# Patient Record
Sex: Male | Born: 1940
Health system: Southern US, Community
[De-identification: ages and names within clinical notes are randomized; demographics above are authoritative.]

## PROBLEM LIST (undated history)

## (undated) DIAGNOSIS — I7300 Raynaud's syndrome without gangrene: Secondary | ICD-10-CM

## (undated) DIAGNOSIS — G4733 Obstructive sleep apnea (adult) (pediatric): Secondary | ICD-10-CM

## (undated) DIAGNOSIS — C679 Malignant neoplasm of bladder, unspecified: Secondary | ICD-10-CM

## (undated) DIAGNOSIS — R0600 Dyspnea, unspecified: Secondary | ICD-10-CM

## (undated) DIAGNOSIS — G35 Multiple sclerosis: Secondary | ICD-10-CM

## (undated) DIAGNOSIS — Z9289 Personal history of other medical treatment: Secondary | ICD-10-CM

## (undated) DIAGNOSIS — I712 Thoracic aortic aneurysm, without rupture, unspecified: Secondary | ICD-10-CM

## (undated) DIAGNOSIS — R339 Retention of urine, unspecified: Secondary | ICD-10-CM

## (undated) DIAGNOSIS — F0390 Unspecified dementia without behavioral disturbance: Secondary | ICD-10-CM

## (undated) DIAGNOSIS — I2510 Atherosclerotic heart disease of native coronary artery without angina pectoris: Secondary | ICD-10-CM

## (undated) DIAGNOSIS — M1990 Unspecified osteoarthritis, unspecified site: Secondary | ICD-10-CM

## (undated) DIAGNOSIS — F03A0 Unspecified dementia, mild, without behavioral disturbance, psychotic disturbance, mood disturbance, and anxiety: Secondary | ICD-10-CM

## (undated) DIAGNOSIS — I498 Other specified cardiac arrhythmias: Secondary | ICD-10-CM

## (undated) DIAGNOSIS — N189 Chronic kidney disease, unspecified: Secondary | ICD-10-CM

## (undated) DIAGNOSIS — I110 Hypertensive heart disease with heart failure: Secondary | ICD-10-CM

## (undated) DIAGNOSIS — I517 Cardiomegaly: Secondary | ICD-10-CM

## (undated) DIAGNOSIS — I5032 Chronic diastolic (congestive) heart failure: Secondary | ICD-10-CM

## (undated) DIAGNOSIS — R911 Solitary pulmonary nodule: Secondary | ICD-10-CM

## (undated) DIAGNOSIS — K219 Gastro-esophageal reflux disease without esophagitis: Secondary | ICD-10-CM

## (undated) DIAGNOSIS — F329 Major depressive disorder, single episode, unspecified: Secondary | ICD-10-CM

## (undated) DIAGNOSIS — F419 Anxiety disorder, unspecified: Secondary | ICD-10-CM

## (undated) DIAGNOSIS — I639 Cerebral infarction, unspecified: Secondary | ICD-10-CM

## (undated) DIAGNOSIS — F319 Bipolar disorder, unspecified: Secondary | ICD-10-CM

## (undated) DIAGNOSIS — J449 Chronic obstructive pulmonary disease, unspecified: Secondary | ICD-10-CM

## (undated) DIAGNOSIS — F32A Depression, unspecified: Secondary | ICD-10-CM

## (undated) DIAGNOSIS — I4510 Unspecified right bundle-branch block: Secondary | ICD-10-CM

## (undated) DIAGNOSIS — R0609 Other forms of dyspnea: Secondary | ICD-10-CM

## (undated) DIAGNOSIS — E785 Hyperlipidemia, unspecified: Secondary | ICD-10-CM

## (undated) DIAGNOSIS — R7989 Other specified abnormal findings of blood chemistry: Secondary | ICD-10-CM

## (undated) DIAGNOSIS — N400 Enlarged prostate without lower urinary tract symptoms: Secondary | ICD-10-CM

## (undated) DIAGNOSIS — I340 Nonrheumatic mitral (valve) insufficiency: Secondary | ICD-10-CM

## (undated) DIAGNOSIS — R9431 Abnormal electrocardiogram [ECG] [EKG]: Secondary | ICD-10-CM

## (undated) HISTORY — DX: Thoracic aortic aneurysm, without rupture: I71.2

## (undated) HISTORY — PX: APPENDECTOMY: SHX54

## (undated) HISTORY — DX: Other forms of dyspnea: R06.09

## (undated) HISTORY — DX: Benign prostatic hyperplasia without lower urinary tract symptoms: N40.0

## (undated) HISTORY — DX: Personal history of other medical treatment: Z92.89

## (undated) HISTORY — DX: Retention of urine, unspecified: R33.9

## (undated) HISTORY — DX: Other specified abnormal findings of blood chemistry: R79.89

## (undated) HISTORY — DX: Obstructive sleep apnea (adult) (pediatric): G47.33

## (undated) HISTORY — DX: Chronic diastolic (congestive) heart failure: I50.32

## (undated) HISTORY — DX: Nonrheumatic mitral (valve) insufficiency: I34.0

## (undated) HISTORY — DX: Hyperlipidemia, unspecified: E78.5

## (undated) HISTORY — PX: TOTAL KNEE ARTHROPLASTY: SHX125

## (undated) HISTORY — DX: Dyspnea, unspecified: R06.00

## (undated) HISTORY — DX: Solitary pulmonary nodule: R91.1

## (undated) HISTORY — DX: Multiple sclerosis: G35

## (undated) HISTORY — DX: Depression, unspecified: F32.A

## (undated) HISTORY — DX: Abnormal electrocardiogram (ECG) (EKG): R94.31

## (undated) HISTORY — PX: HEMORRHOID SURGERY: SHX153

## (undated) HISTORY — PX: ROTATOR CUFF REPAIR: SHX139

## (undated) HISTORY — DX: Malignant neoplasm of bladder, unspecified: C67.9

## (undated) HISTORY — DX: Atherosclerotic heart disease of native coronary artery without angina pectoris: I25.10

## (undated) HISTORY — DX: Unspecified dementia, mild, without behavioral disturbance, psychotic disturbance, mood disturbance, and anxiety: F03.A0

## (undated) HISTORY — DX: Cardiomegaly: I51.7

## (undated) HISTORY — PX: KNEE ARTHROPLASTY: SHX992

## (undated) HISTORY — DX: Major depressive disorder, single episode, unspecified: F32.9

## (undated) HISTORY — PX: TRANSURETHRAL RESECTION OF PROSTATE: SHX73

## (undated) HISTORY — DX: Thoracic aortic aneurysm, without rupture, unspecified: I71.20

## (undated) HISTORY — DX: Unspecified dementia without behavioral disturbance: F03.90

## (undated) HISTORY — PX: TONSILLECTOMY: SUR1361

## (undated) HISTORY — DX: Chronic kidney disease, unspecified: N18.9

## (undated) HISTORY — DX: Hypertensive heart disease with heart failure: I11.0

## (undated) HISTORY — DX: Unspecified osteoarthritis, unspecified site: M19.90

## (undated) HISTORY — PX: COLONOSCOPY: SHX174

---

## 2004-12-14 ENCOUNTER — Ambulatory Visit: Payer: Self-pay | Admitting: Internal Medicine

## 2004-12-21 ENCOUNTER — Ambulatory Visit: Payer: Self-pay | Admitting: Internal Medicine

## 2006-10-11 DIAGNOSIS — G35 Multiple sclerosis: Secondary | ICD-10-CM | POA: Insufficient documentation

## 2007-12-20 ENCOUNTER — Inpatient Hospital Stay (HOSPITAL_COMMUNITY): Admission: RE | Admit: 2007-12-20 | Discharge: 2007-12-22 | Payer: Self-pay | Admitting: Orthopedic Surgery

## 2009-01-24 ENCOUNTER — Encounter: Payer: Self-pay | Admitting: Pulmonary Disease

## 2009-01-24 ENCOUNTER — Encounter: Admission: RE | Admit: 2009-01-24 | Discharge: 2009-01-24 | Payer: Self-pay | Admitting: Family Medicine

## 2009-03-06 ENCOUNTER — Ambulatory Visit: Payer: Self-pay | Admitting: Cardiology

## 2009-03-06 DIAGNOSIS — R0989 Other specified symptoms and signs involving the circulatory and respiratory systems: Secondary | ICD-10-CM | POA: Insufficient documentation

## 2009-03-06 DIAGNOSIS — R0789 Other chest pain: Secondary | ICD-10-CM | POA: Insufficient documentation

## 2009-03-06 DIAGNOSIS — R072 Precordial pain: Secondary | ICD-10-CM | POA: Insufficient documentation

## 2009-03-06 DIAGNOSIS — R0602 Shortness of breath: Secondary | ICD-10-CM | POA: Insufficient documentation

## 2009-03-13 ENCOUNTER — Telehealth: Payer: Self-pay | Admitting: Cardiology

## 2009-03-13 ENCOUNTER — Encounter: Payer: Self-pay | Admitting: Cardiology

## 2009-04-01 ENCOUNTER — Telehealth (INDEPENDENT_AMBULATORY_CARE_PROVIDER_SITE_OTHER): Payer: Self-pay | Admitting: *Deleted

## 2009-04-02 ENCOUNTER — Ambulatory Visit: Payer: Self-pay

## 2009-04-02 ENCOUNTER — Encounter (HOSPITAL_COMMUNITY): Admission: RE | Admit: 2009-04-02 | Discharge: 2009-06-04 | Payer: Self-pay | Admitting: Cardiology

## 2009-04-02 ENCOUNTER — Ambulatory Visit: Payer: Self-pay | Admitting: Internal Medicine

## 2009-04-02 ENCOUNTER — Encounter: Payer: Self-pay | Admitting: Cardiovascular Disease

## 2009-04-03 ENCOUNTER — Ambulatory Visit: Payer: Self-pay | Admitting: Internal Medicine

## 2009-04-07 ENCOUNTER — Encounter: Payer: Self-pay | Admitting: Cardiology

## 2009-04-08 ENCOUNTER — Ambulatory Visit (HOSPITAL_COMMUNITY): Admission: RE | Admit: 2009-04-08 | Discharge: 2009-04-08 | Payer: Self-pay | Admitting: Cardiology

## 2009-04-08 ENCOUNTER — Ambulatory Visit: Payer: Self-pay | Admitting: Cardiology

## 2009-04-08 ENCOUNTER — Ambulatory Visit: Payer: Self-pay

## 2009-04-08 ENCOUNTER — Encounter: Payer: Self-pay | Admitting: Cardiology

## 2009-04-08 DIAGNOSIS — I110 Hypertensive heart disease with heart failure: Secondary | ICD-10-CM

## 2009-04-08 DIAGNOSIS — I059 Rheumatic mitral valve disease, unspecified: Secondary | ICD-10-CM | POA: Insufficient documentation

## 2009-04-08 HISTORY — DX: Hypertensive heart disease with heart failure: I11.0

## 2009-04-09 ENCOUNTER — Telehealth: Payer: Self-pay | Admitting: Cardiology

## 2009-04-23 ENCOUNTER — Telehealth: Payer: Self-pay | Admitting: Cardiology

## 2009-04-24 ENCOUNTER — Telehealth: Payer: Self-pay | Admitting: Cardiology

## 2009-05-26 ENCOUNTER — Ambulatory Visit: Payer: Self-pay | Admitting: Pulmonary Disease

## 2009-05-26 DIAGNOSIS — J438 Other emphysema: Secondary | ICD-10-CM | POA: Insufficient documentation

## 2009-06-11 ENCOUNTER — Encounter: Admission: RE | Admit: 2009-06-11 | Discharge: 2009-06-11 | Payer: Self-pay | Admitting: Orthopedic Surgery

## 2009-08-12 ENCOUNTER — Encounter: Admission: RE | Admit: 2009-08-12 | Discharge: 2009-08-12 | Payer: Self-pay | Admitting: Family Medicine

## 2009-12-10 ENCOUNTER — Encounter: Payer: Self-pay | Admitting: Cardiology

## 2009-12-17 ENCOUNTER — Encounter: Payer: Self-pay | Admitting: Cardiology

## 2009-12-17 ENCOUNTER — Ambulatory Visit: Payer: Self-pay | Admitting: Cardiology

## 2009-12-17 DIAGNOSIS — R9431 Abnormal electrocardiogram [ECG] [EKG]: Secondary | ICD-10-CM | POA: Insufficient documentation

## 2010-01-01 ENCOUNTER — Ambulatory Visit (HOSPITAL_COMMUNITY)
Admission: RE | Admit: 2010-01-01 | Discharge: 2010-01-01 | Payer: Self-pay | Source: Home / Self Care | Attending: Gastroenterology | Admitting: Gastroenterology

## 2010-02-03 NOTE — Assessment & Plan Note (Signed)
Summary: Cardiology Nuclear Study  Nuclear Med Background Indications for Stress Test: Evaluation for Ischemia   History: Abnormal EKG, COPD, Echo, Myocardial Perfusion Study  History Comments: 7/09 Echo: Grade I diastolic dysfunction, mod. LVH, mild MR; 8/09 GEX:BMWUXL, EF=57%  Symptoms: Dizziness, DOE, Fatigue, Near Syncope, Palpitations    Nuclear Pre-Procedure Cardiac Risk Factors: History of Smoking, Hypertension, Lipids, RBBB Caffeine/Decaff Intake: none NPO After: 8:00 PM Lungs: Clear IV 0.9% NS with Angio Cath: 18g     IV Site: (R) AC IV Started by: Stanton Kidney EMT-P Chest Size (in) 44     Height (in): 71 Weight (lb): 199 BMI: 27.86 Tech Comments: (R) Carotid Bruit. Metoprolol last taken approx. 16 hours ago, per Patient.  Nuclear Med Study 1 or 2 day study:  1 day     Stress Test Type:  Stress Reading MD:  Arvilla Meres, MD     Referring MD:  Marca Ancona, MD Resting Radionuclide:  Technetium 46m Tetrofosmin     Resting Radionuclide Dose:  11 mCi  Stress Radionuclide:  Technetium 18m Tetrofosmin     Stress Radionuclide Dose:  33 mCi   Stress Protocol Exercise Time (min):  5:00 min     Max HR:  150 bpm     Predicted Max HR:  153 bpm  Max Systolic BP: 239 mm Hg     Percent Max HR:  98.04 %     METS: 7.0 Rate Pressure Product:  24401    Stress Test Technologist:  Rea College CMA-N     Nuclear Technologist:  Domenic Polite CNMT  Rest Procedure  Myocardial perfusion imaging was performed at rest 45 minutes following the intravenous administration of Myoview Technetium 8m Tetrofosmin.  Stress Procedure  The patient exercised for  five minutes.  The patient stopped due to fatigue and SOB with his O2 Sats dropping to 90% at peak exercise, at which time he had minimal expiratory wheezes.  He denied any chest pain.  There were no significant ST-T wave changes, only occasional PVC's.  He did have a hypertensive response to exercise, 207/111 at peak exercise and  220/102 and 239/87 in recovery.  Myoview was injected at peak exercise and myocardial perfusion imaging was performed after a brief delay.  QPS Raw Data Images:  Normal; no motion artifact; normal heart/lung ratio. Stress Images:  Decreased uptake in inferior wall. Rest Images:  Decreased uptake in inferior wall. Subtraction (SDS):  Fixed thinning of the inferior wall suggestive of previous infarct vs diaphragmatic attenuation. No ischemia. Transient Ischemic Dilatation:  1.02  (Normal <1.22)  Lung/Heart Ratio:  .31  (Normal <0.45)  Quantitative Gated Spect Images QGS EDV:  117 ml QGS ESV:  38 ml QGS EF:  68 % QGS cine images:  Normal  Findings Low risk nuclear study      Overall Impression  Exercise Capacity: Fair exercise capacity. BP Response: Hypertensive blood pressure response. Clinical Symptoms: There is dyspnea. ECG Impression: No significant ST segment change suggestive of ischemia. Overall Impression: Low risk stress nuclear study. Overall Impression Comments: Fixed thinning of the inferior wall suggestive of previous infarct vs diaphragmatic attenuation. No ischemia. O2 sats dropped to 90% at peak exercise.  Appended Document: Cardiology Nuclear Study inferior defect is likely diaphragmatic attenuation, also seen 2009  Appended Document: Cardiology Nuclear Study discussed at [facility] 04-08-09 with Dr Shirlee Latch

## 2010-02-03 NOTE — Miscellaneous (Signed)
Summary: Orders Update pft charges  Clinical Lists Changes  Orders: Added new Service order of Carbon Monoxide diffusing w/capacity (94720) - Signed Added new Service order of Lung Volumes (94240) - Signed Added new Service order of Spirometry (Pre & Post) (94060) - Signed 

## 2010-02-03 NOTE — Progress Notes (Signed)
Summary: Nuclear Pre-Procedure  Phone Note Outgoing Call Call back at [facility] Phone [phone]   Call placed by: Stanton Kidney, EMT-P,  April 01, 2009 3:19 PM Action Taken: Phone Call Completed Summary of Call: Reviewed information on Myoview Information Sheet (see scanned document for further details).  Spoke with Patient's wife.    Nuclear Med Background Indications for Stress Test: Evaluation for Ischemia   History: Abnormal EKG, COPD, Echo, Myocardial Perfusion Study  History Comments: 7/09 Echo: Grade I diatolic dysfunction, mod. LVH, mild MR 8/09 MPS: EF=57%, NL  Symptoms: Chest Pressure, Chest Pressure with Exertion, DOE, Fatigue    Nuclear Pre-Procedure Cardiac Risk Factors: History of Smoking, Hypertension, Lipids, RBBB Height (in): 72

## 2010-02-03 NOTE — Assessment & Plan Note (Signed)
Summary: [name] [name]   Primary Provider:  Dr. Tanya Nones   History of Present Illness: 70 yo with history of HTN, hyperlipidemia, and GERD presents for evaluation of exertional shortness of breath and episodes of chest pain.  For the last year, patient has had gradually worsening exertional shortness of breath.  Now, he is short of breath with moderate exertion. It is hard to climb steps.  On occasion, the exertional dyspnea is associated with mild chest tightness.   His symptoms occur daily.  No orthopnea, PND, syncope, lightheadedness.  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  He has noted significant shortness of breath when he has to use a weedeater.  He used to be a heavy smoker.  His wife says she notice him wheezing occasionally.  He has been taking Advair but is not sure that it is helping.  Additionally, he thinks he has had about a 30 lb weight gain in the last 3 months.    Patient has risk factors for both pulmonary and cardiac causes of dyspnea.  ETT-myoview showed desaturation to 90% on room air at peak exercise.  There was a hypertensive BP response with SBP > 200.  There was a fixed inferior defect likely due to diaphragmatic attenuation similar to 2009 study, no evidence for ischemia.  Echo showed normal LV systolic function, mild diastolic dysfunction, and moderate mitral regurgitation with mild pulmonary hypertension.  Finally, PFTs showed a moderate obstructive deficit suggestive moderate COPD.    Labs (6/10): LDL 81, HDL 66, K 4.5, creatinine 2.13 Labs (4/11): BNP 75  Current Medications (verified): 1)  Metoprolol Tartrate 25 Mg Tabs (Metoprolol Tartrate) .... Take One Tablet Two Times A Day 2)  Prilosec 20 Mg Cpdr (Omeprazole) .... Take One Capsule Once Daily 3)  Celebrex 200 Mg Caps (Celecoxib) .... Take One Tablet Once Daily 4)  Flomax 0.4 Mg Caps (Tamsulosin Hcl) .... Take One Capsule Once Daily 5)   Finasteride 5 Mg Tabs (Finasteride) .... Take One Tablet Once Daily 6)  Diovan 160 Mg Tabs (Valsartan) .... Take One Tablet Once Daily 7)  Effexor Xr 150 Mg Xr24h-Cap (Venlafaxine Hcl) .... Take One Capsule Two Times A Day 8)  Advair Diskus 250-50 Mcg/dose Aepb (Fluticasone-Salmeterol) .... Each Day 9)  Restoril 30 Mg Caps (Temazepam) .... Take One Capsule Every Four To Six Hours 10)  Methocarbamol 500 Mg Tabs (Methocarbamol) .... Take One Tablet Every 6 Hours 11)  Lortab 7.5 7.5-500 Mg Tabs (Hydrocodone-Acetaminophen) .... Take One Tablet Every 4 To 6 Hours For Pain 12)  Aspirin 81 Mg Tbec (Aspirin) .... Take One Tablet By Mouth Daily 13)  Hydrocodone-Acetaminophen 10-325 Mg Tabs (Hydrocodone-Acetaminophen) .... As Needed  Allergies (verified): 1)  ! Codeine Sulfate (Codeine Sulfate)  Past History:  Past Medical History: 1. Hypertension 2. Hypercholesterolemia 3. GERD 4. BPH 5. Osteoarthritis: s/p R TKR 6. Multiple sclerosis: Quiescent over last few years. Diagnosed at age 82.  7. Rotator cuff surgery 2/11 8. COPD: Prior heavy smoker.  PFTs (3/11): FVC 87%,  FEV173%, ratio 0.57, DLCO 75%, TLC 121%.  Moderate obstructive defect.  9. Exertional dyspnea: Lexiscan myoview (8/09) with EF 57%, diaphragmatic attenuation but no ischemia or infarction.  Lexiscan myoview (3/11): EF 68% with fixed inferior defect likely due to diaphragmatic attenuation and no ischemia (similar to 2009).  Echo (4/11): EF 55-60%, mild diastolic dysfunction, moderate mitral regurgitation, mild pulmonary HTN (PASP 38 mmHg), normal RV size and systolic  function.  PFTs (3/11) with moderate obstructive defect.  10.  Moderate mitral regurgitation.  Echo (4/11) with PISA ERO 0.3 cm^2 and regurgitant volume 41 mL.  11.  Carotid dopplers (3/11): no significant stenosis.   Family History: Reviewed history from 03/06/2009 and no changes required. Family History Hypertension Father history of stroke.  Mother history of  stroke.  Sister with hypertension.  Brother with kidney cancer, arthritis and  bilateral knee replacements.  No premature CAD  Social History: Reviewed history from 03/06/2009 and no changes required. Married, lives in [place], does landscaping Tobacco Use - Former, quit 15 years ago, heavy prior (3 ppd).  Alcohol Use - no  Review of Systems       All systems reviewed and negative except as per HPI.   Vital Signs:  Patient profile:   70 year old male Height:      71 inches Weight:      197 pounds BMI:     27.58 Pulse rate:   68 / minute Resp:     16 per minute BP sitting:   142 / 78  (left arm)  Vitals Entered By: Marrion Coy, CNA (April 08, 2009 9:19 AM)  Physical Exam  General:  Well developed, well nourished, in no acute distress. Neck:  Neck supple, no JVD. No masses, thyromegaly or abnormal cervical nodes. Lungs:  Clear except for a prolonged expiratory phase.  Heart:  Non-displaced PMI, chest non-tender; regular rate and rhythm, S1, S2 without  rubs or gallops. 1/6 systolic murmur at apex.  Carotid upstroke normal, right carotid bruit. Pedals normal pulses. No edema, no varicosities. Abdomen:  Bowel sounds positive; abdomen soft and non-tender without masses, organomegaly, or hernias noted. No hepatosplenomegaly. Extremities:  No clubbing or cyanosis. Neurologic:  Alert and oriented x 3. Psych:  Normal affect.   Impression & Recommendations:  Problem # 1:  SHORTNESS OF BREATH (ICD-786.05) This may be mostly due to COPD.  Patient had a moderate obstructive defect on PFTs and was a prior heavy smoker. His myoview was non-ischemic (probably normal with diaphragmatic attenuation) and his echo showed preserved LV systolic function was moderate mitral regurgitation (should not be significant enough to cause dyspnea).  BNP was not significantly elevated.  Patient is taking Advair.  I will see if the addition of Spiriva does anything to alleviate his symptoms.     Problem # 2:  CHEST PAIN-PRECORDIAL (ICD-786.51) Likely noncardiac.  Myoview was nonischemic.  Continue aspirin.    Problem # 3:  CAROTID BRUIT, RIGHT (ICD-785.9) Likely due to tortuous artery.  No significant stenosis on carotid dopplers.   Problem # 4:  MITRAL VALVE DISORDERS (ICD-424.0) Moderate mitral regurgitation on echo.  This is significant but is unlikely to be causing any symptoms.  Will need to follow in the future.  He will need good BP control.   - Increasing valsartan today.  - Repeat echo in 1 year.   Problem # 5:  HYPERTENSION, UNSPECIFIED (ICD-401.9) BP elevated today and he was noted to have a hypertensive response on the treadmill.  Will increase valsartan to 240 mg daily.  BMET and BP check in 2 wks.    I will see the patient back in 6 months.   Other Orders: Echocardiogram (Echo) TLB-BNP (B-Natriuretic Peptide) (83880-[id number])  Patient Instructions: 1)  Your physician has recommended you make the following change in your medication:  2)  Use Spiriva Inhaler daily 3)  Increase Diovan(Valsartan) to a total of 240mg  daily--this will be  a 160mg  and an 80mg  tablet 4)  Your physician recommends that you return for lab work in: 2 weeks--BMP-you have the order --you can have this in [place] 786.09  424.0 5)  Your physician has requested that you regularly monitor and record your blood pressure readings at home.  Please use the same machine at the same time of day to check your readings and record. I will call you in 2 weeks to get the readings. 6)  Lab today--BNP  786.09 424.0  7)  Follow-up appointment with Dr Shirlee Latch in 6 months. 8)  Echocardiogram in 1 year.   Prescriptions: SPIRIVA HANDIHALER 18 MCG CAPS (TIOTROPIUM BROMIDE MONOHYDRATE) use daily as directed  #3 x 3   Entered by:   Katina Dung, RN, BSN   Authorized by:   Marca Ancona, MD   Signed by:   Katina Dung, RN, BSN on 04/08/2009   Method used:   Faxed to ...       [facility])        [address] [address]       [place], [place]  [address]       Ph: [phone]       Fax: [phone]   RxID:   [id number] SPIRIVA HANDIHALER 18 MCG CAPS (TIOTROPIUM BROMIDE MONOHYDRATE) use daily as directed  #3 x 3   Entered by:   Katina Dung, RN, BSN   Authorized by:   Marca Ancona, MD   Signed by:   Katina Dung, RN, BSN on 04/08/2009   Method used:   Print then Give to Patient   RxID:   [id number] DIOVAN 80 MG TABS (VALSARTAN) one tablet daily with Diovan 160mg   #30 x 6   Entered by:   Katina Dung, RN, BSN   Authorized by:   Marca Ancona, MD   Signed by:   Katina Dung, RN, BSN on 04/08/2009   Method used:   Electronically to        [id number] (retail)       [address]       [place], [place]  [address]       Ph: [phone]       Fax: [phone]   RxID:   [id number]

## 2010-02-03 NOTE — Assessment & Plan Note (Signed)
Summary: np6/DOE/weakness/.fatigue   Primary Provider:  Dr. Tanya Nones  CC:  new patient/DOE/weakness and fatigue.Marland Kitchen  History of Present Illness: 70 yo with history of HTN, hyperlipidemia, and GERD presents for evaluation of exertional shortness of breath and episodes of chest pain.  For the last year, patient has had gradually worsening exertional shortness of breath.  Now, he is short of breath simply walking around his house. It is very hard to climb steps.  On occasion, the exertional dyspnea is associated with mild chest tightness.   His symptoms occur daily.  No orthopnea, PND, syncope, lightheadedness.  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  His wife says she notice him wheezing occasionally.  He has been taking Advair but is not sure that it is helping.  Additionally, he thinks he has had about a 30 lb weight gain in the last 3 months.  Oxygen saturation today is 99% at rest.   ECG: NSR, inferior T wave inversions, incomplete RBBB  Labs (6/10): LDL 81, HDL 66, K 4.5, creatinine 1.61  Current Medications (verified): 1)  Bl Ibuprofen 200 Mg  Tabs (Ibuprofen) .... Takes 2400mg  Daily 2)  Metoprolol Tartrate 25 Mg Tabs (Metoprolol Tartrate) .... Take One Tablet Two Times A Day 3)  Prilosec 20 Mg Cpdr (Omeprazole) .... Take One Capsule Once Daily 4)  Celebrex 200 Mg Caps (Celecoxib) .... Take One Tablet Once Daily 5)  Flomax 0.4 Mg Caps (Tamsulosin Hcl) .... Take One Capsule Once Daily 6)  Finasteride 5 Mg Tabs (Finasteride) .... Take One Tablet Once Daily 7)  Diovan 160 Mg Tabs (Valsartan) .... Take One Tablet Once Daily 8)  Effexor Xr 150 Mg Xr24h-Cap (Venlafaxine Hcl) .... Take One Capsule Two Times A Day 9)  Advair Diskus 250-50 Mcg/dose Aepb (Fluticasone-Salmeterol) .... Each Day 10)  Hydromorphone Hcl 4 Mg Tabs (Hydromorphone Hcl) .... Take One Tablet Every 4 Hours 11)  Restoril 30 Mg Caps (Temazepam) ....  Take One Capsule Every Four To Six Hours 12)  Methocarbamol 500 Mg Tabs (Methocarbamol) .... Take One Tablet Every 6 Hours 13)  Lortab 7.5 7.5-500 Mg Tabs (Hydrocodone-Acetaminophen) .... Take One Tablet Every 4 To 6 Hours For Pain  Allergies (verified): 1)  ! Codeine Sulfate (Codeine Sulfate)  Past History:  Past Medical History: 1. Hypertension 2. Hypercholesterolemia 3. GERD 4. BPH 5. Osteoarthritis: s/p R TKR 6. Multiple sclerosis: Quiescent over last few years. Diagnosed at age 53.  7. Rotator cuff surgery 2/11 8. COPD: Prior smoker 9. Exertional dyspnea. Echo (7/09) with moderate LVH, grade I diastolic dysfunction, normal RV, mild MR. Lexiscan myoview (8/09) with EF 57%, diaphragmatic attenuation but no ischemia or infarction.   Family History: Family History Hypertension Father history of stroke.  Mother history of stroke.  Sister with hypertension.  Brother with kidney cancer, arthritis and  bilateral knee replacements.  No premature CAD  Social History: Married, lives in [place], does landscaping Tobacco Use - Former, quit 15 years ago, heavy prior (3 ppd).  Alcohol Use - no  Review of Systems       All systems reviewed and negative except as per HPI.   Vital Signs:  Patient profile:   70 year old male Height:      72 inches Weight:      210 pounds BMI:     28.58 Pulse rate:   78 / minute Pulse rhythm:   regular BP sitting:   117 /  71  (left arm) Cuff size:   large  Vitals Entered By: Judithe Modest CMA (March 06, 2009 3:09 PM)  Physical Exam  General:  Well developed, well nourished, in no acute distress. Head:  normocephalic and atraumatic Nose:  no deformity, discharge, inflammation, or lesions Mouth:  Teeth, gums and palate normal. Oral mucosa normal. Neck:  Neck supple, no JVD. No masses, thyromegaly or abnormal cervical nodes. Lungs:  Clear bilaterally to auscultation with prolonged expiratory phase.   Heart:  Non-displaced PMI, chest  non-tender; regular rate and rhythm, S1, S2 without  rubs or gallops. 1/6 SEM.  Carotid upstroke normal, right carotid bruit. Pedals normal pulses. No edema, no varicosities. Abdomen:  Bowel sounds positive; abdomen soft and non-tender without masses, organomegaly, or hernias noted. No hepatosplenomegaly. Msk:  Back normal, normal gait. Muscle strength and tone normal. Extremities:  No clubbing or cyanosis. Neurologic:  Alert and oriented x 3. Skin:  Intact without lesions or rashes. Psych:  Normal affect.   Impression & Recommendations:  Problem # 1:  SHORTNESS OF BREATH (ICD-786.05) Patient has quite significant exertional dyspnea that has worsened over the past year but has progressed fairly fast in the last few months.  DIfferential includes COPD, CHF, CAD with ischemic diastolic dysfunction.  He used to be a heavy smoker (quit 15 years ago).  He does have the diagnosis of COPD.  He does not appear volume overloaded on exam.  I will have him get an echocardiogram to assess LV and RV function.  I will also draw a BNP.  Given his quite significant symptoms, I think that we should do a full set of PFTs (spirometry, lung volumes, pre-and post-bronchoilator, DLCO) to assess severity of COPD.   Problem # 2:  CHEST PAIN-PRECORDIAL (ICD-786.51) Patient gets mild episodes of chest tightness with the same exertion that brings on the shortness of breath.  Myoview in 2009 prior to these symptoms was normal.  I will have him start ASA 81 mg daily.  I will get an ETT-myoview.  While he is on the treadmill, we will check his oxygen level.    Problem # 3:  CAROTID BRUIT, RIGHT (ICD-785.9) We will get carotid dopplers.   Other Orders: Nuclear Stress Test (Nuc Stress Test) Echocardiogram (Echo) Pulmonary Function Test (PFT) Carotid Duplex (Carotid Duplex)  Patient Instructions: 1)  Your physician has recommended you make the following change in your medication:  2)  Start Aspirin 81mg  daily--this  should be buffered or coated. 3)  Your physician has requested that you have an echocardiogram.  Echocardiography is a painless test that uses sound waves to create images of your heart. It provides your doctor with information about the size and shape of your heart and how well your heart's chambers and valves are working.  This procedure takes approximately one hour. There are no restrictions for this procedure. 4)  Your physician has requested that you have an exercise stress myoview.  For further information please visit [email or web address]  Please follow instruction sheet, as given. 5)  Your physician has recommended that you have a pulmonary function test.  Pulmonary Function Tests are a group of tests that measure how well air moves in and out of your lungs. 6)  Your physician recommends that you schedule a follow-up appointment in: 1 month with Dr Shirlee Latch Tuesday April 5,2011 9:15am --THIS WILL BE AT [address] 7)  Your physician has requested that you have a carotid duplex. This test is an  ultrasound of the carotid arteries in your neck. It looks at blood flow through these arteries that supply the brain with blood. Allow one hour for this exam. There are no restrictions or special instructions.

## 2010-02-03 NOTE — Progress Notes (Signed)
Summary: Dr.Hecker office calling with questions about the pt medication.  Phone Note Other Incoming   Caller: Dr.Hecker office Neysa Bonito [phone] Summary of Call: Office needs a call back about the pt  medication and a procdure (Aspirin) Initial call taken by: Judie Grieve,  March 13, 2009 1:26 PM  Follow-up for Phone Call        S/W Sherri and the pt needs a shave lid biopsy for a lesion which is suspicious for CA. He will need to stop his Celebrex and ASA for 2 weeks. I will s/w Dr. Shirlee Latch and see if this is ok. Fax number is [phone] ATTN: Sherri or Christy Follow-up by: Duncan Dull, RN, BSN,  March 13, 2009 1:57 PM  Additional Follow-up for Phone Call Additional follow up Details #1::        Per Dr. Shirlee Latch it is ok IF the myoview on 3/23 comes back NORMAL. If not he will need to be made aware for further instructions. Sherri will plan to call us back for clearance after the 23rd. I ask her why they needed to stop ASA for 2 weeks for an eyelid shaving and she said that the spot on his lid appears very vascular and they are expecting more bleeding with his biopsy.  Additional Follow-up by: Duncan Dull, RN, BSN,  March 13, 2009 2:15 PM

## 2010-02-03 NOTE — Progress Notes (Signed)
Summary: B/P readings--04-23-09  Phone Note Outgoing Call   Call placed by: Katina Dung, RN, BSN,  April 23, 2009 2:45 PM Call placed to: Patient  Follow-up for Phone Call        get B/P readings-LM for pt to call me to get B/P readings [name] [name]  LM for pt to call to get B/P readings and ask if he had BMP at [facility] office  Katina Dung, RN, BSN  April 23, 2009 5:59 PM   Additional Follow-up for Phone Call Additional follow up Details #1::

## 2010-02-03 NOTE — Progress Notes (Signed)
Summary: clearance  Phone Note From Other Clinic   Caller: nurse sherry Summary of Call: Per Cordelia Pen from Dr Elmer Picker. Pt needs clearance for Biopsy on his eye. [phone] fax [phone] Initial call taken by: Edman Circle,  April 09, 2009 1:05 PM  Follow-up for Phone Call        [facility] for [name], RN, BSN  April 09, 2009 2:09 PM  talked with Cordelia Pen at Dr Deirdre Evener office--requesting pt hold aspirin for 14 days prior to biopsy on eyelid--reviewed with Dr Wilmon Pali to hold Aspirin for 14 days for biopsy--Sherry aware -OK to hold aspirin for 14 days

## 2010-02-03 NOTE — Assessment & Plan Note (Signed)
Summary: consult for dyspnea, moderate emphysema   Copy to:  Lynnea Ferrier Primary Provider/Referring Provider:  Lynnea Ferrier  CC:  Pulmonary Consult.  History of Present Illness: the patient is a 70 year old male who been asked to see for dyspnea. He has a history of breathing issues for at least one year, and feels that his symptoms are progressive in nature. He notes a 2-3 block dyspnea on exertion at a moderate pace on flat ground. However, he will not get winded bringing groceries in from the car or taking trash cans down to the street. He does have a long history of smoking 2 packs per day, but quit 15 years ago. He has had recent full pulmonary function studies that showed moderate obstructive disease, no restriction but definite air-trapping, and a mild reduction in his diffusion capacity. He had a chest x-ray in January that showed no acute process. He has had an echocardiogram in April of this year which showed mild diastolic dysfunction with a mildly dilated left atrium, moderate MR, and a normal left ventricle. The patient has been started on Advair in January, and Spiriva was added approximately one month ago. The patient has not seen any difference in his breathing with either medication. He does note a mild cough has been present since starting Advair. The patient denies any history of thromboembolic disease, and has had no pleuritic chest pain. His weight is up about 30 pounds the past one year.  Medications Prior to Update: 1)  Metoprolol Tartrate 25 Mg Tabs (Metoprolol Tartrate) .... Take One Tablet Two Times A Day 2)  Prilosec 20 Mg Cpdr (Omeprazole) .... Take One Capsule Once Daily 3)  Celebrex 200 Mg Caps (Celecoxib) .... Take One Tablet Once Daily 4)  Flomax 0.4 Mg Caps (Tamsulosin Hcl) .... Take One Capsule Once Daily 5)  Finasteride 5 Mg Tabs (Finasteride) .... Take One Tablet Once Daily 6)  Diovan 160 Mg Tabs (Valsartan) .... Take One Tablet Once Daily 7)  Effexor Xr 150  Mg Xr24h-Cap (Venlafaxine Hcl) .... Take One Capsule Two Times A Day 8)  Advair Diskus 250-50 Mcg/dose Aepb (Fluticasone-Salmeterol) .... Each Day 9)  Restoril 30 Mg Caps (Temazepam) .... Take One Capsule Every Four To Six Hours 10)  Methocarbamol 500 Mg Tabs (Methocarbamol) .... Take One Tablet Every 6 Hours 11)  Lortab 7.5 7.5-500 Mg Tabs (Hydrocodone-Acetaminophen) .... Take One Tablet Every 4 To 6 Hours For Pain 12)  Aspirin 81 Mg Tbec (Aspirin) .... Take One Tablet By Mouth Daily 13)  Hydrocodone-Acetaminophen 10-325 Mg Tabs (Hydrocodone-Acetaminophen) .... As Needed 14)  Diovan 80 Mg Tabs (Valsartan) .... One Tablet Daily With Diovan 160mg  15)  Spiriva Handihaler 18 Mcg Caps (Tiotropium Bromide Monohydrate) .... Use Daily As Directed  Current Medications (verified): 1)  Metoprolol Tartrate 25 Mg Tabs (Metoprolol Tartrate) .... Take One Tablet Two Times A Day 2)  Prilosec 20 Mg Cpdr (Omeprazole) .... Take One Capsule Once Daily 3)  Celebrex 200 Mg Caps (Celecoxib) .... Take One Tablet Once Daily 4)  Flomax 0.4 Mg Caps (Tamsulosin Hcl) .... Take One Capsule Once Daily 5)  Finasteride 5 Mg Tabs (Finasteride) .... Take One Tablet Once Daily 6)  Diovan 160 Mg Tabs (Valsartan) .... Take One Tablet Once Daily 7)  Effexor Xr 150 Mg Xr24h-Cap (Venlafaxine Hcl) .... Take One Capsule Two Times A Day 8)  Advair Diskus 250-50 Mcg/dose Aepb (Fluticasone-Salmeterol) .... Inhale 1 Puff Two Times A Day 9)  Restoril 30 Mg Caps (Temazepam) .... Take One Capsule Every  Four To Six Hours 10)  Methocarbamol 500 Mg Tabs (Methocarbamol) .... Take One Tablet Every 6 Hours 11)  Lortab 7.5 7.5-500 Mg Tabs (Hydrocodone-Acetaminophen) .... Take One Tablet Every 4 To 6 Hours For Pain 12)  Aspirin 81 Mg Tbec (Aspirin) .... Take One Tablet By Mouth Daily 13)  Hydrocodone-Acetaminophen 10-325 Mg Tabs (Hydrocodone-Acetaminophen) .... As Needed 14)  Diovan 80 Mg Tabs (Valsartan) .... One Tablet Daily With Diovan  160mg  15)  Spiriva Handihaler 18 Mcg Caps (Tiotropium Bromide Monohydrate) .... Inhale Contents of 1 Capsule Once A Day  Allergies (verified): 1)  ! Codeine Sulfate (Codeine Sulfate)  Past History:  Past Medical History: Reviewed history from 04/08/2009 and no changes required. 1. Hypertension 2. Hypercholesterolemia 3. GERD 4. BPH 5. Osteoarthritis: s/p R TKR 6. Multiple sclerosis: Quiescent over last few years. Diagnosed at age 70.  7. Rotator cuff surgery 2/11 8. COPD: Prior heavy smoker.  PFTs (3/11): FVC 87%,  FEV173%, ratio 0.57, DLCO 75%, TLC 121%.  Moderate obstructive defect.  9. Exertional dyspnea: Lexiscan myoview (8/09) with EF 57%, diaphragmatic attenuation but no ischemia or infarction.  Lexiscan myoview (3/11): EF 68% with fixed inferior defect likely due to diaphragmatic attenuation and no ischemia (similar to 2009).  Echo (4/11): EF 55-60%, mild diastolic dysfunction, moderate mitral regurgitation, mild pulmonary HTN (PASP 38 mmHg), normal RV size and systolic function.  PFTs (3/11) with moderate obstructive defect.  10.  Moderate mitral regurgitation.  Echo (4/11) with PISA ERO 0.3 cm^2 and regurgitant volume 41 mL.  11.  Carotid dopplers (3/11): no significant stenosis.   Past Surgical History: Tonsillectomy Appendectomy Hemorrhoid Right knee surgery R shoulder surgery   Family History: Reviewed history from 03/06/2009 and no changes required. Family History Hypertension Father history of stroke.  Mother history of stroke.  Sister with hypertension.  Brother with kidney cancer, arthritis and  bilateral knee replacements.  No premature CAD heart disease: father, brother  Social History: Reviewed history from 03/06/2009 and no changes required. Married with children. lives in [place],  does landscaping. self-employeed. Tobacco Use - Former.  started at age 51.  2 ppd.  quit 1996. Alcohol Use - no  Review of Systems       The patient complains of  shortness of breath with activity, productive cough, weight change, anxiety, depression, and joint stiffness or pain.  The patient denies shortness of breath at rest, non-productive cough, coughing up blood, chest pain, irregular heartbeats, acid heartburn, indigestion, loss of appetite, abdominal pain, difficulty swallowing, sore throat, tooth/dental problems, headaches, nasal congestion/difficulty breathing through nose, sneezing, itching, ear ache, hand/feet swelling, rash, change in color of mucus, and fever.    Vital Signs:  Patient profile:   70 year old male Height:      71 inches Weight:      203 pounds BMI:     28.42 O2 Sat:      93 % on Room air Temp:     97.6 degrees F oral Pulse rate:   90 / minute BP sitting:   132 / 64  (left arm) Cuff size:   regular  Vitals Entered By: Arman Filter LPN (May 26, 2009 2:16 PM)  O2 Flow:  Room air  Serial Vital Signs/Assessments:  Comments: 3:30 PM Ambulatory Pulse Oximetry  Resting; HR_80____    02 Sat___95%ra__  Lap1 (185 feet)   HR__87___   02 Sat__94%ra___ Lap2 (185 feet)   HR__90___   02 Sat__94%ra___    Lap3 (185 feet)   HR___94__  02 Sat__94%ra___  X___Test Completed without Difficulty ___Test Stopped due to: Arman Filter LPN  May 26, 2009 3:30 PM    By: Arman Filter LPN   CC: Pulmonary Consult Comments Medications reviewed with patient Arman Filter LPN  May 26, 2009 2:16 PM    Physical Exam  General:  ow male in nad Eyes:  PERRLA and EOMI.   Nose:  narrowed bilat, right worse than left Mouth:  clear Neck:  no jvd, tmg, LN Lungs:  clear to auscultation, no wheezing or rhonchi Heart:  rrr, no mrg. Abdomen:  soft and nontender, bs+ Extremities:  no edema, but varicosities noted. no cyanosis, pulses intact distally. Neurologic:  alert and oriented, moves all 4.   Impression & Recommendations:  Problem # 1:  SHORTNESS OF BREATH (ICD-786.05)  I think the pt's sob is multifactorial.  He has definite  moderate emphysema by pfts, but his degree of sob is out of proportion to this.  He has gained 30 pounds in the last one year, and admits that he has been doing very little exercise.  It is unclear whether his MR and ?diastolic dysfunction are playing roles as well.  He also has a h/o MS that is felt to be in remission, but wonder if a neuromuscular issue could be affecting his skeletal muscles?  At this point, will continue an aggressive bronchodilator regimen and get him to work aggressively on weight loss and exercise program.  He will followup with me in 3mos to check on his progress.  Problem # 2:  EMPHYSEMA (ICD-492.8)  the pt is on an excellent bronchodilator regimen for his emphysema, but will change advair to symbicort due to his ongoing cough after he uses the advair.  He maintained excellent oxygen sats today even on brisk ambulation thru office.  Medications Added to Medication List This Visit: 1)  Advair Diskus 250-50 Mcg/dose Aepb (Fluticasone-salmeterol) .... Inhale 1 puff two times a day 2)  Spiriva Handihaler 18 Mcg Caps (Tiotropium bromide monohydrate) .... Inhale contents of 1 capsule once a day 3)  Symbicort 160-4.5 Mcg/act Aero (Budesonide-formoterol fumarate) .... Two puffs twice daily  Other Orders: Consultation Level IV (99244) Pulse Oximetry, Ambulatory (69629)  Patient Instructions: 1)  will change advair to symbicort 160/4.5  2 in am and pm...rinse mouth well. 2)  stay on spiriva for now, one each am. 3)  work on weight loss and conditioning program aggressively next 3mos. 4)  proair rescue inhaler 2 puffs up to every 6hrs if needed for emergencies. 5)  followup with me in 3mos.  Prescriptions: SYMBICORT 160-4.5 MCG/ACT  AERO (BUDESONIDE-FORMOTEROL FUMARATE) Two puffs twice daily  #1 x 6   Entered and Authorized by:   Barbaraann Share MD   Signed by:   Barbaraann Share MD on 05/26/2009   Method used:   Print then Give to Patient   RxID:   [id number]

## 2010-02-03 NOTE — Progress Notes (Signed)
Summary: b/p readings  Phone Note Call from Patient   Caller: Patient Summary of Call: pt was returning Anne's call from yesterday to give b/p readings  4/6 was 131/69 4/7 was 154/84 4/8 was 152/77 4/9 was 129/93 4/11was 133/69 4/12 was 136/73 4/13 was 123/75  4/14 was 127/75 4/15 was 133/81 4/16 was 126/71 4/19 was 149/87 Initial call taken by: Omer Jack,  April 24, 2009 2:06 PM  Follow-up for Phone Call        [facility] Scherrie Bateman, LPN  April 24, 2009 2:22 PM     Appended Document: b/p readings these look ok for the most part.   Appended Document: b/p readings talked with wife by telephone

## 2010-02-03 NOTE — Letter (Signed)
Summary: [facility]   [facility]   Imported By: Roderic Ovens 04/10/2009 14:45:29  _____________________________________________________________________  External Attachment:    Type:   Image     Comment:   External Document

## 2010-02-05 NOTE — Assessment & Plan Note (Signed)
Summary: [name] [name]   Visit Type:  Follow-up Primary Provider:  Lynnea Ferrier   History of Present Illness: 70 yo with history of HTN, hyperlipidemia, COPD and GERD presents for cardiology followup.  Patient was seen in the past for chronic exertional dyspnea.   ETT-myoview showed desaturation to 90% on room air at peak exercise.  There was a hypertensive BP response with SBP > 200.  There was a fixed inferior defect likely due to diaphragmatic attenuation similar to 2009 study, no evidence for ischemia.  Echo showed normal LV systolic function, mild diastolic dysfunction, and moderate mitral regurgitation with mild pulmonary hypertension.  Finally, PFTs showed a moderate obstructive deficit suggestive moderate COPD.  He was started on an inhaler regimen for COPD and saw Dr. Shelle Iron with pulmonary.  He did not think the inhalers helped much so stopped them. He continues to have some shortness of breath with heavy exertion: climbing a ladder too fast, weedeating too fast, pushing a lawn mower too fast.  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  No chest pain.   Patient has been seen by orthopedics at [facility] for shoulder evaluation and needs to undergo a rotator cuff surgery.  He was noted on pre-operative evaluation to have an ECG with a pattern in leads V1-V3 that could be consistent with a Type II Brugada pattern.  I reviewed this ECG and agree with that assessment at a possibility.  I repeated an ECG today, which shows an rSR' pattern but does not have as much of a Brugada-type appearance as the ECG done at [facility].  He has no history of tachypalpitations, syncope, or presyncope.  No family history of sudden death.    ECG: NSR, rSR' pattern V1, less concerning for Type II Brugada pattern compared to the ECG at [facility].   Labs (6/10): LDL 81, HDL 66, K 4.5, creatinine 6.96 Labs (4/11): BNP 75  Current Medications (verified): 1)  Prilosec 20 Mg Cpdr (Omeprazole) .... Take One Capsule Once Daily 2)   Celebrex 200 Mg Caps (Celecoxib) .... Take One Tablet Once Daily 3)  Flomax 0.4 Mg Caps (Tamsulosin Hcl) .... Take One Capsule Once Daily 4)  Diovan 160 Mg Tabs (Valsartan) .... Take One Tablet Once Daily 5)  Effexor Xr 150 Mg Xr24h-Cap (Venlafaxine Hcl) .... Take One Capsule Two Times A Day 6)  Restoril 30 Mg Caps (Temazepam) .... Take 1 Tab By Mouth At Bedtime As Needed 7)  Methocarbamol 500 Mg Tabs (Methocarbamol) .... Take One Tablet Every 6 Hours As Needed 8)  Hydrocodone-Acetaminophen 10-325 Mg Tabs (Hydrocodone-Acetaminophen) .... As Needed 9)  Ferrous Sulfate 325 (65 Fe) Mg  Tabs (Ferrous Sulfate) .... 2 Once Daily 10)  Ropinirole Hcl 1 Mg Tabs (Ropinirole Hcl) .Marland Kitchen.. 1 At Bedtime 11)  Provigil 200 Mg Tabs (Modafinil) .... Once Daily 12)  Lidoderm 5 % Ptch (Lidocaine) .... Once Daily 13)  Desmopressin Acetate 0.2 Mg Tabs (Desmopressin Acetate) .Marland Kitchen.. 1-3 Tabs At Bedtime  Allergies (verified): 1)  ! Codeine Sulfate (Codeine Sulfate)  Past History:  Past Medical History: 1. Hypertension 2. Hypercholesterolemia 3. GERD 4. BPH 5. Osteoarthritis: s/p R TKR 6. Multiple sclerosis: Quiescent over last few years. Diagnosed at age 39.  7. Rotator cuff surgery 2/11 8. COPD: Prior heavy smoker.  PFTs (3/11): FVC 87%,  FEV173%, ratio 0.57, DLCO 75%, TLC 121%.  Moderate obstructive defect.  9. Exertional dyspnea: Lexiscan myoview (8/09) with EF 57%, diaphragmatic attenuation but no ischemia or infarction.  Lexiscan myoview (3/11): EF 68%  with fixed inferior defect likely due to diaphragmatic attenuation and no ischemia (similar to 2009).  Echo (4/11): EF 55-60%, mild diastolic dysfunction, moderate mitral regurgitation, mild pulmonary HTN (PASP 38 mmHg), normal RV size and systolic function.  PFTs (3/11) with moderate obstructive defect.  10.  Moderate mitral regurgitation.  Echo (4/11) with PISA ERO 0.3 cm^2 and regurgitant volume 41 mL.  11.  Carotid dopplers (3/11): no significant stenosis.    12.  Possible Type II Brugada ECG pattern.  No family history of SCD, no syncope, no tachypalpitations.   Family History: Family History Hypertension Father history of stroke.  Mother history of stroke.  Sister with hypertension.  Brother with kidney cancer, arthritis and  bilateral knee replacements.  No premature CAD No sudden cardiac death  Social History: Married with children. 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 Tobacco Use - Former.  started at age 32.  2 ppd.  quit 1996. Alcohol Use - no  Review of Systems       All systems reviewed and negative except as per HPI.   Vital Signs:  Patient profile:   70 year old male Height:      71 inches Weight:      204 pounds BMI:     28.56 Pulse rate:   75 / minute BP sitting:   142 / 72  (left arm) Cuff size:   regular  Vitals Entered By: Hardin Negus, RMA (December 17, 2009 2:43 PM)  Physical Exam  General:  Well developed, well nourished, in no acute distress. Neck:  Neck supple, no JVD. No masses, thyromegaly or abnormal cervical nodes. Lungs:  Slightly decreased breath sounds bilaterally.  Heart:  Non-displaced PMI, chest non-tender; regular rate and rhythm, S1, S2 without rubs or gallops. 1/6 systolic murmur towards the apex.  Carotid upstroke normal, no bruit. Pedals normal pulses. No edema, no varicosities. Abdomen:  Bowel sounds positive; abdomen soft and non-tender without masses, organomegaly, or hernias noted. No hepatosplenomegaly. Extremities:  No clubbing or cyanosis. Neurologic:  Alert and oriented x 3. Psych:  Normal affect.   Impression & Recommendations:  Problem # 1:  ABNORMAL ELECTROCARDIOGRAM (ICD-794.31) Cannot rule out Type II Brugada pattern on ECG, especially one done at [facility] (less convincing on our ECG today).  He has no family history of sudden cardiac death and he has never had syncope or even significant palpitations.  There is no indication for further workup of this as  he is asymptomatic.   Would not do a sodium channel blocker challenge or an EP study.  If he does indeed have Type II Brugada, there is some chance anesthesia could uncover a Type I pattern but there really isn't data available on how likely this would be to happen.  I would keep him overnight in the hospital after surgery for monitoring, rather than doing this as a day surgery.  I will also restart him on a beta blocker.    Problem # 2:  HYPERTENSION, UNSPECIFIED (ICD-401.9) BP running high at times.  He is on valsartan.  He stopped metoprolol.  Given his upcoming surgery, I will put him back on a beta blocker.  I will use bisoprolol 2.5 mg daily given his COPD and the beta-1 selectivity of bisoprolol.   Problem # 3:  EMPHYSEMA (ICD-492.8) Moderate obstruction on PFTs.  No longer smokes.  Would start back on Spiriva.   Problem # 4:  MITRAL VALVE DISORDERS (ICD-424.0) Moderate mitral regurgitation on echo.  This is significant  but is unlikely to be causing any symptoms.  Control BP, check echo again in 4/12 for progression.  Patient should restart ASA 81 mg daily after shoulder surgery.   Other Orders: EKG w/ Interpretation (93000) Echocardiogram (Echo)  Patient Instructions: 1)  Your physician has recommended you make the following change in your medication:  2)  Start Bisoprolol 2.5mg  daily--this will be one-half of a 5mg  tablet daily. You should start this now.  3)  Use Spiriva Inhaler daily for your breathing. 4)  Start Aspirin 81mg  daily after you have your surgery--this should be enteric coated. 5)  Your physician has requested that you have an echocardiogram.  Echocardiography is a painless test that uses sound waves to create images of your heart. It provides your doctor with information about the size and shape of your heart and how well your heart's chambers and valves are working.  This procedure takes approximately one hour. There are no restrictions for this procedure. April  2012 6)  Your physician wants you to follow-up in: 1 year with Dr Shirlee Latch. [facility] 2012)   You will receive a reminder letter in the mail two months in advance. If you don't receive a letter, please call our office to schedule the follow-up appointment. Prescriptions: SPIRIVA HANDIHALER 18 MCG CAPS (TIOTROPIUM BROMIDE MONOHYDRATE) use daily as directed  #3 x 3   Entered by:   Katina Dung, RN, BSN   Authorized by:   Marca Ancona, MD   Signed by:   Katina Dung, RN, BSN on 12/17/2009   Method used:   Faxed to ...       [facility] education officer)       [address] [address]       [place], [place]  [address]       Ph: [phone]       Fax: [phone]   RxID:   [id number] BISOPROLOL FUMARATE 5 MG TABS (BISOPROLOL FUMARATE) one-half daily  #45 x 3   Entered by:   Katina Dung, RN, BSN   Authorized by:   Marca Ancona, MD   Signed by:   Katina Dung, RN, BSN on 12/17/2009   Method used:   Faxed to ...       [facility] Environmental education officer)       [address] [address]       [place], [place]  [address]       Ph: [phone]       Fax: [phone]   RxID:   [id number] SPIRIVA HANDIHALER 18 MCG CAPS (TIOTROPIUM BROMIDE MONOHYDRATE) use daily as directed  #3 x 3   Entered by:   Katina Dung, RN, BSN   Authorized by:   Marca Ancona, MD   Signed by:   Katina Dung, RN, BSN on 12/17/2009   Method used:   Faxed to ...       [facility] [id number] (retail)       [address]       [place], [place]  [address]       Ph: [phone]       Fax: [phone]   RxID:   [id number] BISOPROLOL FUMARATE 5 MG TABS (BISOPROLOL FUMARATE) one-half daily  #45 x 3   Entered by:   Katina Dung, RN, BSN   Authorized by:   Marca Ancona, MD   Signed by:   Katina Dung, RN, BSN on 12/17/2009   Method used:   Faxed to [facility]  [address] [id number] (retail)       [address]       [place], [place]  [address]       Ph: [phone]       Fax:  [phone]   RxID:   [id number] BISOPROLOL FUMARATE 5 MG TABS (BISOPROLOL FUMARATE) one-half daily  #15 x 11   Entered by:   Katina Dung, RN, BSN   Authorized by:   Marca Ancona, MD   Signed by:   Katina Dung, RN, BSN on 12/17/2009   Method used:   Print then Give to Patient   RxID:   [id number]   Appended Document: [name] [name] faxed to Dr Wendee Copp at [phone]  ph [phone]

## 2010-02-05 NOTE — Letter (Signed)
Summary: Patient Emails  Patient Emails   Imported By: Marylou Mccoy 01/06/2010 17:14:06  _____________________________________________________________________  External Attachment:    Type:   Image     Comment:   External Document

## 2010-02-09 ENCOUNTER — Telehealth: Payer: Self-pay | Admitting: Cardiology

## 2010-02-19 NOTE — Progress Notes (Signed)
Summary: calling about pt taking 71/2 mg of Bisoprolol  Phone Note From Pharmacy Call back at [phone]   [facility]er: [facility]  [address] [id number] Summary of Call: Calling regarding the pt taking 71/2 mg of Bisoprolol Initial call taken by: Judie Grieve,  February 09, 2010 8:57 AM  Follow-up for Phone Call        [facility] from [facility] [address] woul like for Dr. Shirlee Latch know, the pharmacy made an error when filling a prescription Bisoprolol on 12/17/09. On the instructions on how to take the medication, Pt. was instruncted to take Bisoprolol one tablet daily instead of 1/2 tablet. Then in Jan17th pt. had a prescrition refill for 1/2 tablet of same medication. Pt. has been taken 1 and 1/2 tablet daily after the refill. Meanie would like to know what instructions to give the pt. of what to start taken. The Pharmacist would like to make sure so pt. won't drop the dosage from 7.5 mg to 2.5 mg daily all the sudden. Follow-up by: Ollen Gross, RN, BSN,  February 09, 2010 10:16 AM     Appended Document: calling about pt taking 71/2 mg of Bisoprolol I had wanted him to take bisoprolol 2.5 mg daily.   Appended Document: calling about pt taking 71/2 mg of Bisoprolol [facility] aware. She will instruct pt. to take 2.5 mg Bisoprolol daily.

## 2010-02-27 ENCOUNTER — Telehealth: Payer: Self-pay | Admitting: Cardiology

## 2010-03-02 ENCOUNTER — Telehealth: Payer: Self-pay | Admitting: Cardiology

## 2010-03-03 NOTE — Progress Notes (Signed)
Summary: refill  Phone Note Refill Request Message from:  Patient on February 27, 2010 10:20 AM  Refills Requested: Medication #1:  BISOPROLOL FUMARATE 5 MG TABS one-half daily [facility] [phone]  Initial call taken by: Judie Grieve,  February 27, 2010 10:22 AM Caller: Patient  Follow-up for Phone Call       Follow-up by: Judithe Modest CMA,  February 27, 2010 3:43 PM    Prescriptions: BISOPROLOL FUMARATE 5 MG TABS (BISOPROLOL FUMARATE) one-half daily  #45 x 3   Entered by:   Judithe Modest CMA   Authorized by:   Marca Ancona, MD   Signed by:   Judithe Modest CMA on 02/27/2010   Method used:   Faxed to ...       [facility] Environmental education officer)       [address] [address]       [place], [place]  [address]       Ph: [phone]       Fax: [phone]   RxID:   [id number]

## 2010-03-12 NOTE — Progress Notes (Signed)
Summary: can pt go home after surgery  Phone Note Call from Patient Call back at [phone]   Caller: Spouse/Carolyn Reason for Call: Talk to Nurse, Talk to Doctor Summary of Call: pt is at [facility] having surgery and spouse wants to make sure it is ok with Dr. Shirlee Latch if the patient stayed over night or is it ok for patient to be discharged if the surgeons feel it is ok  Initial call taken by: Omer Jack,  March 02, 2010 11:17 AM  Follow-up for Phone Call        talked with wife --Dr Shirlee Latch recommended pt stay the night at the hospital--wife is aware

## 2010-04-20 ENCOUNTER — Other Ambulatory Visit (HOSPITAL_COMMUNITY): Payer: Self-pay | Admitting: Radiology

## 2010-05-19 NOTE — Op Note (Signed)
NAME:  [name] [name], [name] [name] NO.:  [id number]   MEDICAL RECORD NO.:  [id number]          PATIENT TYPE:  INP   LOCATION:  1605                         FACILITY:  [facility]   PHYSICIAN:  Ollen Gross, M.D.    DATE OF BIRTH:  November 02, 1940   DATE OF PROCEDURE:  12/20/2007  DATE OF DISCHARGE:                               OPERATIVE REPORT   PREOP DIAGNOSIS:  Osteoarthritis, right knee.   POSTOPERATIVE DIAGNOSIS:  Osteoarthritis, right knee.   PROCEDURE:  Right total knee arthroplasty.   SURGEON:  Ollen Gross, M.D.   ASSISTANT:  Alexzandrew L. Perkins, P.A.C.   ANESTHESIA:  Spinal with Duramorph.   ESTIMATED BLOOD LOSS:  Minimal.   DRAINS:  None.   COMPLICATIONS:  None.   TOURNIQUET TIME:  35 minutes at 300 mmHg.   CONDITION:  Stable to recovery room.   CLINICAL NOTE:  [name] is 70 year old male with endstage arthritis  of the right knee with progressively worsening pain and dysfunction.  Has failed nonoperative management and presents for total knee  arthroplasty.   PROCEDURE IN DETAIL:  After successful administration of spinal  anesthetic, a tourniquet placed on his right thigh and right lower  extremity prepped and draped in usual sterile fashion.  Extremity is  wrapped in Esmarch, knee flexed, tourniquet inflated to 300 mmHg.  Midline incision made with a 10 blade through subcutaneous tissue to the  level of the extensor mechanism.  A fresh blade is used make a medial  parapatellar arthrotomy.  Soft tissue of the proximal medial tibia is  subperiosteally elevated to the joint line with the knife into the  semimembranosus bursa with a Cobb elevator.  Soft tissue laterally is  elevated with attention being paid to avoid the patellar tendon on  tibial tubercle.  Patella subluxed laterally, knee flexed 90 degrees and  ACL and PCL removed.  Drill was used create a starting hole in the  distal femur and the canal was thoroughly irrigated.  The 5-degree  right  valgus alignment guide is placed and referencing off the posterior  condyles, rotations marked, and the block pinned to remove 10 mm off the  distal femur.  Distal femoral resection is made with an oscillating saw.  Sizing blocks placed, size four is most appropriate.  Rotations marked  at the epicondylar axis.  A size four cutting block is placed in the  anterior-posterior and chamfer cuts made.   The tibia subluxed forward and menisci removed.  Extramedullary tibial  alignment guide is placed referencing proximally at the medial aspect of  the tibial tubercle and distally along the second metatarsal axis and  tibial crest.  Blocks pinned to remove 10 mm off the non deficient  lateral side.  Tibial resection is made with an oscillating saw.  Size  four is the most appropriate tibial component and the proximal tibia is  prepared, the modular drill and keel punch for the size four.  Femoral  preparation is completed with the intercondylar cut.   Size four mobile bearing tibial trial size four posterior stabilized  femoral trial and a 10 mm posterior  stabilized rotating platform insert  trial are placed.  With the 10, full extension is achieved with  excellent varus-valgus anterior and posterior balance throughout full  range of motion.  Patella was everted and thickness measured to be 24  mm.  Freehand resection is taken to 14 mm, 38 template is placed, lug  holes were drilled, trial patella was placed and it tracks normally.  Osteophytes were removed off the posterior femur with the trial in  place.  All trials were removed and the cut bone surfaces were prepared  with pulsatile lavage.  Cement was mixed and once ready for implantation  the size four mobile bearing tibial tray size four posterior stabilized  femur and 38 patella are cemented into place.  The patella was held with  a clamp.  Trial 10-mm inserts placed, knee held in full extension and  all extruded cement removed.   When the cement was fully hardened then a  permanent 10-mm posterior stabilized rotating platform insert is placed  into the tibial tray.  The wound was copiously irrigated with saline  solution and then FloSeal injected on the posterior capsule, medial and  lateral gutters and suprapatellar area.  Moist sponge is placed and  tourniquet released for total time of 35 minutes.  Sponge was held for 2  minutes then removed.  Minimal bleeding was encountered.  The bleeding  that was encountered was stopped with electrocautery.  The wound was  again copiously irrigated with saline and then the arthrotomy closed  with interrupted #1 PDS.  Flexion against gravity was 140 degrees.  Subcu was then closed with interrupted 2-0 Vicryl and subcuticular  running 4-0 Monocryl.  Incisions were cleaned and dried and Steri-Strips  and bulky sterile dressing applied.  He was then placed into a knee  immobilizer, awakened and transported to recovery in stable condition.      Ollen Gross, M.D.  Electronically Signed     FA/MEDQ  D:  12/20/2007  T:  12/21/2007  Job:  [id number]

## 2010-05-22 NOTE — Discharge Summary (Signed)
NAME:  [name] [name], [name] [name] NO.:  [id number]   MEDICAL RECORD NO.:  [id number]          PATIENT TYPE:  INP   LOCATION:  1605                         FACILITY:  [facility]   PHYSICIAN:  Ollen Gross, M.D.    DATE OF BIRTH:  13-Oct-1940   DATE OF ADMISSION:  12/20/2007  DATE OF DISCHARGE:  12/22/2007                               DISCHARGE SUMMARY   ADMISSION DIAGNOSES:  1. Osteoarthritis, right knee.  2. Hypertension.  3. History of congestive heart failure.  4. Mild cardiac hypertrophy.  5. Hypercholesterolemia.  6. Reflux disease.  7. Benign prostatic hypertrophy.  8. Multiple sclerosis.   DISCHARGE DIAGNOSES:  1. Osteoarthritis right knee status post right total knee replacement      arthroplasty.  2. Postoperative blood loss anemia, did not require transfusion.  3. Hypertension.  4. History of congestive heart failure.  5. Mild cardiac hypertrophy.  6. Hypercholesterolemia.  7. Reflux disease.  8. Benign prostatic hypertrophy.  9. Multiple sclerosis.   PROCEDURE:  December 20, 2007, right total knee.   SURGEON:  Ollen Gross, M.D.   ASSISTANT:  Alexzandrew L. Perkins, P.A.C.   ANESTHESIA:  Spinal anesthesia with Duramorph added.   CONSULTS:  None.   BRIEF HISTORY:  Mr. Lewin is a 70 year old male with end-stage  arthritis of right knee, progressive worsening pain and dysfunction,  failed operative management and now presents for total knee  arthroplasty.   LABORATORY DATA:  Preop CBC showed low hemoglobin 11.9, hematocrit 36.7,  white cell count 9.6, platelets 310.  Postop hemoglobin down to 10.1,  last H and H was 8.5 and 26.3.  Serial pro-times followed per Coumadin  protocol.  Last PT/INR 18.4 and 1.5.  Chem panel on admission slightly  elevated potassium at 5.5, BUN slightly elevated at 26.  Remaining chem  panel within normal limits.  Serial BMETs were followed.  Potassium came  down to normal level 4.2.  Remaining electrolytes remained within  normal  limits.  Preop UA straw-colored, otherwise negative.  Blood group type  O+.   DIAGNOSTICS:  1. EKG December 14, 2007:  Sinus bradycardia, first-degree AV block,      incomplete right bundle branch block confirmed by Dr. Patty Sermons.  2. Two-view chest x-ray December 14, 2007:  No acute cardiopulmonary      process.   HOSPITAL COURSE:  The patient admitted to [facility], taken to  the OR, underwent the above stated procedure without complication.  The  patient tolerated the procedure well, later transferred to the recovery  room on the orthopedic floor.  He was seen on rounds on day 1, actually  doing fantastic, in good spirits, pain was under good control.  We  resumed his home meds and had excellent urinary output.  Hemoglobin was  stable.  He got up and did very well with physical therapy walking about  130 feet on day 1.  By day 2, the patient was doing well.  Pain was  under excellent control.  Asking to go home.  Did 2 rounds of therapy,  walked about 130 feet that morning and 165 feet  later that day,  tolerating the meds.  INR was slowly coming up and we decided to  discharge the patient home at that time.   DISCHARGE/PLAN:  The patient discharged home on December 22, 2007.   DISCHARGE MEDICATIONS:  1. Percocet 7.5.  2. Robaxin.  3. Coumadin.  4. Nu-Iron.   FOLLOW UP:  Followup in 2 weeks.   DIET:  Heart-healthy diet.   ACTIVITY:  Weightbearing as tolerated, home with PT and home health  nursing, total knee protocol.   DISPOSITION:  Home.   CONDITION ON DISCHARGE:  Improving.      Alexzandrew L. Perkins, P.A.C.      Ollen Gross, M.D.  Electronically Signed    ALP/MEDQ  D:  02/01/2008  T:  02/01/2008  Job:  [id number]   cc:   Ollen Gross, M.D.  Fax: [phone]   Priscille Heidelberg. Pamalee Leyden, MD   Courtney Paris, M.D.  Fax: [phone]   [facility]

## 2010-05-22 NOTE — H&P (Signed)
NAME:  [name] [name], [name] [name] NO.:  [id number]   MEDICAL RECORD NO.:  [id number]          PATIENT TYPE:  INP   LOCATION:  NA                           FACILITY:  [facility]   PHYSICIAN:  Ollen Gross, M.D.    DATE OF BIRTH:  29-Oct-1940   DATE OF ADMISSION:  12/20/2007  DATE OF DISCHARGE:                              HISTORY & PHYSICAL   CHIEF COMPLAINT:  Right knee pain.   HISTORY OF PRESENT ILLNESS:  The patient is 70 year old male who is seen  by Dr. Lequita Halt for ongoing right knee pain.  He has known arthritis for  many years now.  He continues to have increasing pain and dysfunction.  X-rays at this time show that has bone-on-bone changes medial and also  patellofemoral compartments with a slight varus malalignment.  At this  point, pain is becoming more debilitating and affecting him more and  more.  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  It is felt he would benefit undergoing surgical intervention.  Risks and  benefits discussed.  The patient was subsequently admitted to the  hospital.   ALLERGIES:  NO KNOWN DRUG ALLERGIES.   CURRENT MEDICATIONS:  Omeprazole, benazepril, aspirin, finasteride,  simvastatin, Flomax, metoprolol, turmeric, omega-3 fish oils, tramadol,  naproxen.   PAST MEDICAL HISTORY:  1. Hypertension.  2. History of congestive heart failure.  3. Mild cardiac hypertrophy.  4. Hypercholesterolemia.  5. Reflux disease.  6. BPH.  7. Osteoarthritis.  8. History of multiple sclerosis.   PAST SURGICAL HISTORY:  1. Tonsils.  2. Appendix.  3. Hemorrhoid.  4. Right knee surgery.   FAMILY HISTORY:  Father history of stroke.  Mother history of stroke.  Sister with hypertension.  Brother with kidney cancer, arthritis and  bilateral knee replacements.   SOCIAL HISTORY:  Married, does yard work with Aeronautical engineer, past smoker,  two children.  Family and wife will be assisting with care after  surgery.  One-story home with one  step entering.  He does have a living  will and healthcare power of attorney.   REVIEW OF SYSTEMS:  GENERAL:  No fevers, chills, night sweats.  NEUROLOGICAL:  A little bit of ringing in the ears but no seizures,  syncope or paralysis.  RESPIRATORY:  Little bit of shortness of breath  on exertion, but no shortness of breath at rest.  No productive cough or  hemoptysis.  CARDIOVASCULAR:  No chest pain, angina or orthopnea.  GI:  No nausea, vomiting, diarrhea or constipation.  GU: Little bit of  nocturia frequency with a weak stream.  Does have BPH.  MUSCULOSKELETAL:  Joint pain and swelling.   PHYSICAL EXAMINATION:  VITAL SIGNS:  Pulse 64, respirations 12, blood  pressure 136/52.  GENERAL:  A 70 year old white male well-nourished, well-developed.  No  acute distress.  Alert, oriented and cooperative.  Excellent  historian.  HEENT:  Normocephalic, atraumatic.  Pupils round and reactive.  EOMs  intact.  NECK:  Supple.  No bruits.  CHEST:  Clear.  HEART:  Regular rate and rhythm with a faint systolic ejection murmur  best heard over aortic and Erb's point.  ABDOMEN:  Soft, nontender.  Bowel sounds present.  RECTAL/BREASTS/GENITOURINARY:  Not done, not pertinent to present  illness.  EXTREMITIES:  Right knee no effusion.  Range of motion 5-120, marked  crepitus tender more medial than lateral.   IMPRESSION:  Osteoarthritis right knee.   PLAN:  The patient admitted to [facility] to undergo a right  total knee replacement arthroplasty.  Surgery will be performed by Dr.  Ollen Gross.      Alexzandrew L. Perkins, P.A.C.      Ollen Gross, M.D.  Electronically Signed    ALP/MEDQ  D:  12/18/2007  T:  12/18/2007  Job:  [id number]   cc:   Broadus John T. Pamalee Leyden, MD   Courtney Paris, M.D.  Fax: [phone]   [facility]

## 2010-08-07 ENCOUNTER — Encounter: Payer: Self-pay | Admitting: Family Medicine

## 2010-10-09 LAB — CBC
HCT: 30.3 % — ABNORMAL LOW (ref 39.0–52.0)
Hemoglobin: 8.5 g/dL — ABNORMAL LOW (ref 13.0–17.0)
MCHC: 32.2 g/dL (ref 30.0–36.0)
MCV: 92.4 fL (ref 78.0–100.0)
Platelets: 310 10*3/uL (ref 150–400)
Platelets: 320 10*3/uL (ref 150–400)
RBC: 2.85 MIL/uL — ABNORMAL LOW (ref 4.22–5.81)
WBC: 10.8 10*3/uL — ABNORMAL HIGH (ref 4.0–10.5)
WBC: 12.8 10*3/uL — ABNORMAL HIGH (ref 4.0–10.5)
WBC: 9.6 10*3/uL (ref 4.0–10.5)

## 2010-10-09 LAB — URINALYSIS, ROUTINE W REFLEX MICROSCOPIC
Nitrite: NEGATIVE
Protein, ur: NEGATIVE mg/dL
Specific Gravity, Urine: 1.015 (ref 1.005–1.030)
Urobilinogen, UA: 0.2 mg/dL (ref 0.0–1.0)

## 2010-10-09 LAB — BASIC METABOLIC PANEL
BUN: 14 mg/dL (ref 6–23)
CO2: 25 mEq/L (ref 19–32)
Chloride: 110 mEq/L (ref 96–112)
Creatinine, Ser: 1.02 mg/dL (ref 0.4–1.5)
GFR calc Af Amer: >60 mL/min (ref >60)
GFR calc non Af Amer: >60 mL/min (ref >60)
Potassium: 4.6 mEq/L (ref 3.5–5.1)
Sodium: 138 mEq/L (ref 135–145)

## 2010-10-09 LAB — COMPREHENSIVE METABOLIC PANEL
ALT: 15 U/L (ref 0–53)
AST: 18 U/L (ref 0–37)
Albumin: 3.9 g/dL (ref 3.5–5.2)
Alkaline Phosphatase: 94 U/L (ref 39–117)
Chloride: 109 mEq/L (ref 96–112)
Creatinine, Ser: 1.09 mg/dL (ref 0.4–1.5)
GFR calc Af Amer: >60 mL/min (ref >60)
Potassium: 5.5 mEq/L — ABNORMAL HIGH (ref 3.5–5.1)
Sodium: 141 mEq/L (ref 135–145)
Total Bilirubin: 0.4 mg/dL (ref 0.3–1.2)

## 2010-10-09 LAB — PROTIME-INR
INR: 1 (ref 0.00–1.49)
INR: 1.5 (ref 0.00–1.49)
Prothrombin Time: 18.4 seconds — ABNORMAL HIGH (ref 11.6–15.2)

## 2010-10-09 LAB — ABO/RH: ABO/RH(D): O POS

## 2010-10-09 LAB — TYPE AND SCREEN

## 2010-10-09 LAB — APTT: aPTT: 31 seconds (ref 24–37)

## 2011-01-13 DIAGNOSIS — F39 Unspecified mood [affective] disorder: Secondary | ICD-10-CM | POA: Diagnosis not present

## 2011-01-27 DIAGNOSIS — F39 Unspecified mood [affective] disorder: Secondary | ICD-10-CM | POA: Diagnosis not present

## 2011-01-27 DIAGNOSIS — Z79899 Other long term (current) drug therapy: Secondary | ICD-10-CM | POA: Diagnosis not present

## 2011-01-28 DIAGNOSIS — F39 Unspecified mood [affective] disorder: Secondary | ICD-10-CM | POA: Diagnosis not present

## 2011-02-03 DIAGNOSIS — N401 Enlarged prostate with lower urinary tract symptoms: Secondary | ICD-10-CM | POA: Diagnosis not present

## 2011-02-03 DIAGNOSIS — N319 Neuromuscular dysfunction of bladder, unspecified: Secondary | ICD-10-CM | POA: Diagnosis not present

## 2011-02-03 DIAGNOSIS — R351 Nocturia: Secondary | ICD-10-CM | POA: Diagnosis not present

## 2011-02-03 DIAGNOSIS — N3946 Mixed incontinence: Secondary | ICD-10-CM | POA: Diagnosis not present

## 2011-02-03 DIAGNOSIS — R3915 Urgency of urination: Secondary | ICD-10-CM | POA: Diagnosis not present

## 2011-02-05 DIAGNOSIS — F39 Unspecified mood [affective] disorder: Secondary | ICD-10-CM | POA: Diagnosis not present

## 2011-02-12 DIAGNOSIS — M25569 Pain in unspecified knee: Secondary | ICD-10-CM | POA: Diagnosis not present

## 2011-02-16 DIAGNOSIS — N318 Other neuromuscular dysfunction of bladder: Secondary | ICD-10-CM | POA: Diagnosis not present

## 2011-02-16 DIAGNOSIS — N319 Neuromuscular dysfunction of bladder, unspecified: Secondary | ICD-10-CM | POA: Diagnosis not present

## 2011-02-16 DIAGNOSIS — G35 Multiple sclerosis: Secondary | ICD-10-CM | POA: Diagnosis not present

## 2011-02-16 DIAGNOSIS — N138 Other obstructive and reflux uropathy: Secondary | ICD-10-CM | POA: Diagnosis not present

## 2011-02-16 DIAGNOSIS — R3915 Urgency of urination: Secondary | ICD-10-CM | POA: Diagnosis not present

## 2011-02-16 DIAGNOSIS — R351 Nocturia: Secondary | ICD-10-CM | POA: Diagnosis not present

## 2011-02-16 DIAGNOSIS — N401 Enlarged prostate with lower urinary tract symptoms: Secondary | ICD-10-CM | POA: Diagnosis not present

## 2011-02-16 DIAGNOSIS — N3941 Urge incontinence: Secondary | ICD-10-CM | POA: Diagnosis not present

## 2011-02-16 DIAGNOSIS — N390 Urinary tract infection, site not specified: Secondary | ICD-10-CM | POA: Diagnosis not present

## 2011-02-17 DIAGNOSIS — F39 Unspecified mood [affective] disorder: Secondary | ICD-10-CM | POA: Diagnosis not present

## 2011-03-01 ENCOUNTER — Ambulatory Visit
Admission: RE | Admit: 2011-03-01 | Discharge: 2011-03-01 | Disposition: A | Discharge status: Home / Self Care | Payer: Medicare Other | Source: Ambulatory Visit | Attending: Family Medicine | Admitting: Family Medicine

## 2011-03-01 ENCOUNTER — Other Ambulatory Visit: Payer: Self-pay | Admitting: Family Medicine

## 2011-03-01 DIAGNOSIS — M25569 Pain in unspecified knee: Secondary | ICD-10-CM | POA: Diagnosis not present

## 2011-03-01 DIAGNOSIS — M1710 Unilateral primary osteoarthritis, unspecified knee: Secondary | ICD-10-CM | POA: Diagnosis not present

## 2011-03-11 DIAGNOSIS — H524 Presbyopia: Secondary | ICD-10-CM | POA: Diagnosis not present

## 2011-03-11 DIAGNOSIS — H43399 Other vitreous opacities, unspecified eye: Secondary | ICD-10-CM | POA: Diagnosis not present

## 2011-03-11 DIAGNOSIS — H2510 Age-related nuclear cataract, unspecified eye: Secondary | ICD-10-CM | POA: Diagnosis not present

## 2011-03-11 DIAGNOSIS — H4720 Unspecified optic atrophy: Secondary | ICD-10-CM | POA: Diagnosis not present

## 2011-03-11 DIAGNOSIS — L719 Rosacea, unspecified: Secondary | ICD-10-CM | POA: Diagnosis not present

## 2011-03-11 DIAGNOSIS — F99 Mental disorder, not otherwise specified: Secondary | ICD-10-CM | POA: Diagnosis not present

## 2011-03-11 DIAGNOSIS — F639 Impulse disorder, unspecified: Secondary | ICD-10-CM | POA: Diagnosis not present

## 2011-03-15 DIAGNOSIS — M009 Pyogenic arthritis, unspecified: Secondary | ICD-10-CM | POA: Diagnosis not present

## 2011-03-25 DIAGNOSIS — H04129 Dry eye syndrome of unspecified lacrimal gland: Secondary | ICD-10-CM | POA: Diagnosis not present

## 2011-03-25 DIAGNOSIS — H2510 Age-related nuclear cataract, unspecified eye: Secondary | ICD-10-CM | POA: Diagnosis not present

## 2011-03-25 DIAGNOSIS — L719 Rosacea, unspecified: Secondary | ICD-10-CM | POA: Diagnosis not present

## 2011-03-25 DIAGNOSIS — H43399 Other vitreous opacities, unspecified eye: Secondary | ICD-10-CM | POA: Diagnosis not present

## 2011-03-25 DIAGNOSIS — H40019 Open angle with borderline findings, low risk, unspecified eye: Secondary | ICD-10-CM | POA: Diagnosis not present

## 2011-03-29 DIAGNOSIS — G894 Chronic pain syndrome: Secondary | ICD-10-CM | POA: Diagnosis not present

## 2011-03-31 DIAGNOSIS — R3915 Urgency of urination: Secondary | ICD-10-CM | POA: Diagnosis not present

## 2011-03-31 DIAGNOSIS — N138 Other obstructive and reflux uropathy: Secondary | ICD-10-CM | POA: Diagnosis not present

## 2011-03-31 DIAGNOSIS — R351 Nocturia: Secondary | ICD-10-CM | POA: Diagnosis not present

## 2011-03-31 DIAGNOSIS — N319 Neuromuscular dysfunction of bladder, unspecified: Secondary | ICD-10-CM | POA: Diagnosis not present

## 2011-03-31 DIAGNOSIS — N3941 Urge incontinence: Secondary | ICD-10-CM | POA: Diagnosis not present

## 2011-03-31 DIAGNOSIS — G35 Multiple sclerosis: Secondary | ICD-10-CM | POA: Diagnosis not present

## 2011-03-31 DIAGNOSIS — N401 Enlarged prostate with lower urinary tract symptoms: Secondary | ICD-10-CM | POA: Diagnosis not present

## 2011-03-31 DIAGNOSIS — N318 Other neuromuscular dysfunction of bladder: Secondary | ICD-10-CM | POA: Diagnosis not present

## 2011-03-31 DIAGNOSIS — R350 Frequency of micturition: Secondary | ICD-10-CM | POA: Diagnosis not present

## 2011-04-21 DIAGNOSIS — F99 Mental disorder, not otherwise specified: Secondary | ICD-10-CM | POA: Diagnosis not present

## 2011-04-21 DIAGNOSIS — F39 Unspecified mood [affective] disorder: Secondary | ICD-10-CM | POA: Diagnosis not present

## 2011-05-04 DIAGNOSIS — N318 Other neuromuscular dysfunction of bladder: Secondary | ICD-10-CM | POA: Diagnosis not present

## 2011-05-04 DIAGNOSIS — N3941 Urge incontinence: Secondary | ICD-10-CM | POA: Diagnosis not present

## 2011-05-04 DIAGNOSIS — R3915 Urgency of urination: Secondary | ICD-10-CM | POA: Diagnosis not present

## 2011-05-04 DIAGNOSIS — R351 Nocturia: Secondary | ICD-10-CM | POA: Diagnosis not present

## 2011-05-04 DIAGNOSIS — N319 Neuromuscular dysfunction of bladder, unspecified: Secondary | ICD-10-CM | POA: Diagnosis not present

## 2011-05-04 DIAGNOSIS — R350 Frequency of micturition: Secondary | ICD-10-CM | POA: Diagnosis not present

## 2011-05-04 DIAGNOSIS — N401 Enlarged prostate with lower urinary tract symptoms: Secondary | ICD-10-CM | POA: Diagnosis not present

## 2011-05-04 DIAGNOSIS — G35 Multiple sclerosis: Secondary | ICD-10-CM | POA: Diagnosis not present

## 2011-05-12 DIAGNOSIS — F39 Unspecified mood [affective] disorder: Secondary | ICD-10-CM | POA: Diagnosis not present

## 2011-05-12 DIAGNOSIS — R310 Gross hematuria: Secondary | ICD-10-CM | POA: Diagnosis not present

## 2011-05-12 DIAGNOSIS — N319 Neuromuscular dysfunction of bladder, unspecified: Secondary | ICD-10-CM | POA: Diagnosis not present

## 2011-05-12 DIAGNOSIS — F99 Mental disorder, not otherwise specified: Secondary | ICD-10-CM | POA: Diagnosis not present

## 2011-05-12 DIAGNOSIS — N390 Urinary tract infection, site not specified: Secondary | ICD-10-CM | POA: Diagnosis not present

## 2011-05-13 DIAGNOSIS — F639 Impulse disorder, unspecified: Secondary | ICD-10-CM | POA: Diagnosis not present

## 2011-05-13 DIAGNOSIS — F99 Mental disorder, not otherwise specified: Secondary | ICD-10-CM | POA: Diagnosis not present

## 2011-05-13 DIAGNOSIS — G35 Multiple sclerosis: Secondary | ICD-10-CM | POA: Diagnosis not present

## 2011-05-20 DIAGNOSIS — N318 Other neuromuscular dysfunction of bladder: Secondary | ICD-10-CM | POA: Diagnosis not present

## 2011-05-20 DIAGNOSIS — N319 Neuromuscular dysfunction of bladder, unspecified: Secondary | ICD-10-CM | POA: Diagnosis not present

## 2011-05-20 DIAGNOSIS — N3941 Urge incontinence: Secondary | ICD-10-CM | POA: Diagnosis not present

## 2011-05-20 DIAGNOSIS — G35 Multiple sclerosis: Secondary | ICD-10-CM | POA: Diagnosis not present

## 2011-05-20 DIAGNOSIS — R339 Retention of urine, unspecified: Secondary | ICD-10-CM | POA: Diagnosis not present

## 2011-05-20 DIAGNOSIS — R351 Nocturia: Secondary | ICD-10-CM | POA: Diagnosis not present

## 2011-05-20 DIAGNOSIS — N401 Enlarged prostate with lower urinary tract symptoms: Secondary | ICD-10-CM | POA: Diagnosis not present

## 2011-05-20 DIAGNOSIS — R3915 Urgency of urination: Secondary | ICD-10-CM | POA: Diagnosis not present

## 2011-06-09 DIAGNOSIS — F99 Mental disorder, not otherwise specified: Secondary | ICD-10-CM | POA: Diagnosis not present

## 2011-06-09 DIAGNOSIS — F639 Impulse disorder, unspecified: Secondary | ICD-10-CM | POA: Diagnosis not present

## 2011-06-10 DIAGNOSIS — F319 Bipolar disorder, unspecified: Secondary | ICD-10-CM | POA: Diagnosis not present

## 2011-06-10 DIAGNOSIS — R5381 Other malaise: Secondary | ICD-10-CM | POA: Diagnosis not present

## 2011-06-10 DIAGNOSIS — R5383 Other fatigue: Secondary | ICD-10-CM | POA: Diagnosis not present

## 2011-06-10 DIAGNOSIS — G35 Multiple sclerosis: Secondary | ICD-10-CM | POA: Diagnosis not present

## 2011-06-10 DIAGNOSIS — R42 Dizziness and giddiness: Secondary | ICD-10-CM | POA: Diagnosis not present

## 2011-06-16 DIAGNOSIS — G35 Multiple sclerosis: Secondary | ICD-10-CM | POA: Diagnosis not present

## 2011-06-18 DIAGNOSIS — N320 Bladder-neck obstruction: Secondary | ICD-10-CM | POA: Diagnosis not present

## 2011-06-18 DIAGNOSIS — R9431 Abnormal electrocardiogram [ECG] [EKG]: Secondary | ICD-10-CM | POA: Diagnosis not present

## 2011-06-18 DIAGNOSIS — G35 Multiple sclerosis: Secondary | ICD-10-CM | POA: Diagnosis not present

## 2011-06-18 DIAGNOSIS — N390 Urinary tract infection, site not specified: Secondary | ICD-10-CM | POA: Diagnosis not present

## 2011-06-18 DIAGNOSIS — Z01810 Encounter for preprocedural cardiovascular examination: Secondary | ICD-10-CM | POA: Diagnosis not present

## 2011-06-18 DIAGNOSIS — Z01818 Encounter for other preprocedural examination: Secondary | ICD-10-CM | POA: Diagnosis not present

## 2011-06-28 DIAGNOSIS — M25569 Pain in unspecified knee: Secondary | ICD-10-CM | POA: Diagnosis not present

## 2011-06-28 DIAGNOSIS — Z96659 Presence of unspecified artificial knee joint: Secondary | ICD-10-CM | POA: Diagnosis not present

## 2011-06-28 DIAGNOSIS — M1710 Unilateral primary osteoarthritis, unspecified knee: Secondary | ICD-10-CM | POA: Diagnosis not present

## 2011-07-05 DIAGNOSIS — Z9889 Other specified postprocedural states: Secondary | ICD-10-CM | POA: Diagnosis not present

## 2011-07-05 DIAGNOSIS — G35 Multiple sclerosis: Secondary | ICD-10-CM | POA: Diagnosis not present

## 2011-07-05 DIAGNOSIS — N320 Bladder-neck obstruction: Secondary | ICD-10-CM | POA: Diagnosis not present

## 2011-07-05 DIAGNOSIS — N401 Enlarged prostate with lower urinary tract symptoms: Secondary | ICD-10-CM | POA: Diagnosis not present

## 2011-07-05 DIAGNOSIS — G35D Multiple sclerosis, unspecified: Secondary | ICD-10-CM | POA: Diagnosis not present

## 2011-07-06 DIAGNOSIS — N320 Bladder-neck obstruction: Secondary | ICD-10-CM | POA: Diagnosis not present

## 2011-07-06 DIAGNOSIS — Z9889 Other specified postprocedural states: Secondary | ICD-10-CM | POA: Diagnosis not present

## 2011-07-06 DIAGNOSIS — G35 Multiple sclerosis: Secondary | ICD-10-CM | POA: Diagnosis not present

## 2011-07-06 DIAGNOSIS — N401 Enlarged prostate with lower urinary tract symptoms: Secondary | ICD-10-CM | POA: Diagnosis not present

## 2011-07-21 DIAGNOSIS — M25569 Pain in unspecified knee: Secondary | ICD-10-CM | POA: Diagnosis not present

## 2011-07-28 DIAGNOSIS — M25569 Pain in unspecified knee: Secondary | ICD-10-CM | POA: Diagnosis not present

## 2011-07-30 DIAGNOSIS — M25569 Pain in unspecified knee: Secondary | ICD-10-CM | POA: Diagnosis not present

## 2011-08-04 DIAGNOSIS — M25569 Pain in unspecified knee: Secondary | ICD-10-CM | POA: Diagnosis not present

## 2011-08-06 DIAGNOSIS — M25569 Pain in unspecified knee: Secondary | ICD-10-CM | POA: Diagnosis not present

## 2011-08-11 DIAGNOSIS — M25569 Pain in unspecified knee: Secondary | ICD-10-CM | POA: Diagnosis not present

## 2011-08-16 DIAGNOSIS — M25569 Pain in unspecified knee: Secondary | ICD-10-CM | POA: Diagnosis not present

## 2011-08-17 DIAGNOSIS — N529 Male erectile dysfunction, unspecified: Secondary | ICD-10-CM | POA: Diagnosis not present

## 2011-08-17 DIAGNOSIS — R3989 Other symptoms and signs involving the genitourinary system: Secondary | ICD-10-CM | POA: Diagnosis not present

## 2011-08-17 DIAGNOSIS — N318 Other neuromuscular dysfunction of bladder: Secondary | ICD-10-CM | POA: Diagnosis not present

## 2011-08-18 DIAGNOSIS — M25569 Pain in unspecified knee: Secondary | ICD-10-CM | POA: Diagnosis not present

## 2011-08-20 DIAGNOSIS — M25569 Pain in unspecified knee: Secondary | ICD-10-CM | POA: Diagnosis not present

## 2011-08-23 ENCOUNTER — Encounter (HOSPITAL_COMMUNITY): Payer: Self-pay | Admitting: *Deleted

## 2011-08-23 ENCOUNTER — Emergency Department (HOSPITAL_COMMUNITY)
Admission: EM | Admit: 2011-08-23 | Discharge: 2011-08-23 | Disposition: A | Discharge status: Home / Self Care | Payer: Medicare Other | Attending: Emergency Medicine | Admitting: Emergency Medicine

## 2011-08-23 DIAGNOSIS — F3289 Other specified depressive episodes: Secondary | ICD-10-CM | POA: Insufficient documentation

## 2011-08-23 DIAGNOSIS — K219 Gastro-esophageal reflux disease without esophagitis: Secondary | ICD-10-CM | POA: Insufficient documentation

## 2011-08-23 DIAGNOSIS — Q248 Other specified congenital malformations of heart: Secondary | ICD-10-CM | POA: Diagnosis not present

## 2011-08-23 DIAGNOSIS — F329 Major depressive disorder, single episode, unspecified: Secondary | ICD-10-CM | POA: Diagnosis not present

## 2011-08-23 DIAGNOSIS — Z885 Allergy status to narcotic agent status: Secondary | ICD-10-CM | POA: Diagnosis not present

## 2011-08-23 DIAGNOSIS — Z01818 Encounter for other preprocedural examination: Secondary | ICD-10-CM | POA: Diagnosis not present

## 2011-08-23 DIAGNOSIS — M129 Arthropathy, unspecified: Secondary | ICD-10-CM | POA: Diagnosis not present

## 2011-08-23 DIAGNOSIS — G35 Multiple sclerosis: Secondary | ICD-10-CM | POA: Diagnosis not present

## 2011-08-23 DIAGNOSIS — M25569 Pain in unspecified knee: Secondary | ICD-10-CM | POA: Diagnosis not present

## 2011-08-23 DIAGNOSIS — I4510 Unspecified right bundle-branch block: Secondary | ICD-10-CM | POA: Diagnosis not present

## 2011-08-23 DIAGNOSIS — Z8639 Personal history of other endocrine, nutritional and metabolic disease: Secondary | ICD-10-CM | POA: Insufficient documentation

## 2011-08-23 DIAGNOSIS — Z96659 Presence of unspecified artificial knee joint: Secondary | ICD-10-CM | POA: Diagnosis not present

## 2011-08-23 DIAGNOSIS — R9431 Abnormal electrocardiogram [ECG] [EKG]: Secondary | ICD-10-CM | POA: Insufficient documentation

## 2011-08-23 DIAGNOSIS — Z862 Personal history of diseases of the blood and blood-forming organs and certain disorders involving the immune mechanism: Secondary | ICD-10-CM | POA: Diagnosis not present

## 2011-08-23 DIAGNOSIS — J4489 Other specified chronic obstructive pulmonary disease: Secondary | ICD-10-CM | POA: Insufficient documentation

## 2011-08-23 DIAGNOSIS — M259 Joint disorder, unspecified: Secondary | ICD-10-CM | POA: Diagnosis not present

## 2011-08-23 DIAGNOSIS — J449 Chronic obstructive pulmonary disease, unspecified: Secondary | ICD-10-CM | POA: Insufficient documentation

## 2011-08-23 DIAGNOSIS — R6889 Other general symptoms and signs: Secondary | ICD-10-CM | POA: Diagnosis not present

## 2011-08-23 HISTORY — DX: Other specified cardiac arrhythmias: I49.8

## 2011-08-23 HISTORY — DX: Gastro-esophageal reflux disease without esophagitis: K21.9

## 2011-08-23 HISTORY — DX: Unspecified right bundle-branch block: I45.10

## 2011-08-23 HISTORY — DX: Chronic obstructive pulmonary disease, unspecified: J44.9

## 2011-08-23 LAB — BASIC METABOLIC PANEL
BUN: 25 mg/dL — ABNORMAL HIGH (ref 6–23)
CO2: 27 mEq/L (ref 19–32)
Chloride: 98 mEq/L (ref 96–112)
Creatinine, Ser: 1.12 mg/dL (ref 0.50–1.35)
GFR calc Af Amer: 76 mL/min — ABNORMAL LOW (ref >90)
Potassium: 5 mEq/L (ref 3.5–5.1)

## 2011-08-23 NOTE — ED Notes (Signed)
Pt scheduled for knee replacement tomorrow, had pre-op labs today, reports potassium of 5.9. Pt denies other complaints.

## 2011-08-23 NOTE — ED Provider Notes (Signed)
History     CSN: [id number]  Arrival date & time 08/23/11  1758   First MD Initiated Contact with Patient 08/23/11 2142      Chief Complaint  Patient presents with  . Abnormal Lab    (Consider location/radiation/quality/duration/timing/severity/associated sxs/prior treatment) HPI Comments: Patient presents today for management of hyperkalemia.  Patient had a pre-op physical earlier today.  He is scheduled to have total knee replacement done tomorrow at [facility].  His pre-op labs showed a Potassium of 5.7.   He denies any muscle cramping, nausea, vomiting, or chest pain.  No prior history of Hyperkalemia.  The history is provided by the patient.    Past Medical History  Diagnosis Date  . MS (multiple sclerosis)   . Arthritis   . Diastolic dysfunction   . Depression   . Right bundle branch block   . Brugada syndrome   . COPD (chronic obstructive pulmonary disease)   . GERD (gastroesophageal reflux disease)     Past Surgical History  Procedure Date  . Total knee arthroplasty   . Shoulder surgery     History reviewed. No pertinent family history.  History  Substance Use Topics  . Smoking status: Not on file  . Smokeless tobacco: Not on file  . Alcohol Use: No      Review of Systems  Constitutional: Negative for fever and chills.  Respiratory: Negative for shortness of breath.   Cardiovascular: Negative for chest pain.  Gastrointestinal: Negative for nausea, vomiting and abdominal distention.  Musculoskeletal: Negative for myalgias.  Neurological: Negative for dizziness, syncope and light-headedness.    Allergies  Codeine sulfate  Home Medications   Current Outpatient Rx  Name Route Sig Dispense Refill  . CELECOXIB 200 MG PO CAPS Oral Take 200 mg by mouth daily.      Marland Kitchen DIAZEPAM 5 MG PO TABS Oral Take 5 mg by mouth every 12 (twelve) hours as needed. anxiety    . DIVALPROEX SODIUM ER 500 MG PO TB24 Oral Take 500 mg by mouth at bedtime.    Marland Kitchen  HYDROCODONE-ACETAMINOPHEN 10-500 MG PO TABS Oral Take 1 tablet by mouth every 6 (six) hours as needed. pain    . LOSARTAN POTASSIUM 50 MG PO TABS Oral Take 50 mg by mouth daily.      Marland Kitchen METHOCARBAMOL 500 MG PO TABS Oral Take 1,000 mg by mouth 2 (two) times daily.    Marland Kitchen MODAFINIL 200 MG PO TABS Oral Take 200 mg by mouth daily.      Marland Kitchen OMEPRAZOLE 20 MG PO CPDR Oral Take 20 mg by mouth daily.      Marland Kitchen ROPINIROLE HCL 1 MG PO TABS Oral Take 1 mg by mouth at bedtime.    Marland Kitchen TIOTROPIUM BROMIDE MONOHYDRATE 18 MCG IN CAPS Inhalation Place 18 mcg into inhaler and inhale daily.    Marland Kitchen VALSARTAN 160 MG PO TABS Oral Take 160 mg by mouth daily.    . VENLAFAXINE HCL 75 MG PO TABS Oral Take 75 mg by mouth 2 (two) times daily.      BP 132/71  Pulse 71  Temp 97.9 F (36.6 C) (Oral)  Resp 20  SpO2 94%  Physical Exam  Nursing note and vitals reviewed. Constitutional: He appears well-developed and well-nourished. No distress.  HENT:  Head: Normocephalic and atraumatic.  Neck: Normal range of motion. Neck supple.  Cardiovascular: Normal rate, regular rhythm and normal heart sounds.   Pulmonary/Chest: Effort normal and breath sounds normal.  Abdominal: Soft. Bowel sounds  are normal. There is no tenderness.  Neurological: He is alert.  Skin: Skin is warm and dry. He is not diaphoretic.  Psychiatric: He has a normal mood and affect.    ED Course  Procedures (including critical care time)  Labs Reviewed  BASIC METABOLIC PANEL - Abnormal; Notable for the following:    Sodium 134 (*)     BUN 25 (*)     GFR calc non Af Amer 65 (*)     GFR calc Af Amer 76 (*)     All other components within normal limits   No results found.   No diagnosis found.   Date: 08/23/2011  Rate: 66  Rhythm: normal sinus rhythm  QRS Axis: left  Intervals: normal  ST/T Wave abnormalities: normal  Conduction Disutrbances:right bundle branch block  Narrative Interpretation:   Old EKG Reviewed: unchanged    MDM  Patient  presenting after he was found to have a Potassium of 5.7 at his pre-op physical.  His Potassium in the ED is 5.0.  Therefore, patient was not given any treatment and discharged home.        Pascal Lux [place], PA-C 08/24/11 1919

## 2011-08-24 DIAGNOSIS — K219 Gastro-esophageal reflux disease without esophagitis: Secondary | ICD-10-CM | POA: Diagnosis present

## 2011-08-24 DIAGNOSIS — G35 Multiple sclerosis: Secondary | ICD-10-CM | POA: Diagnosis present

## 2011-08-24 DIAGNOSIS — M25569 Pain in unspecified knee: Secondary | ICD-10-CM | POA: Diagnosis not present

## 2011-08-24 DIAGNOSIS — N318 Other neuromuscular dysfunction of bladder: Secondary | ICD-10-CM | POA: Diagnosis present

## 2011-08-24 DIAGNOSIS — J449 Chronic obstructive pulmonary disease, unspecified: Secondary | ICD-10-CM | POA: Diagnosis present

## 2011-08-24 DIAGNOSIS — Q248 Other specified congenital malformations of heart: Secondary | ICD-10-CM | POA: Diagnosis not present

## 2011-08-24 DIAGNOSIS — M1710 Unilateral primary osteoarthritis, unspecified knee: Secondary | ICD-10-CM | POA: Diagnosis not present

## 2011-08-24 DIAGNOSIS — I4510 Unspecified right bundle-branch block: Secondary | ICD-10-CM | POA: Diagnosis present

## 2011-08-25 NOTE — ED Provider Notes (Signed)
Medical screening examination/treatment/procedure(s) were conducted as a shared visit with non-physician practitioner(s) and myself.  I personally evaluated the patient during the encounter  [name] [name] is a 70 y.o. male hx of arthritis, multiple sclerosis presenting with elevated potassium of 5.7 at presurgical testing for L total knee replacement. He feels well and had no abdominal pain or diarrhea.   Vitals stable. Cardiac, lung exam nl. EKG showed no peaked T waves. Potassium is 5.0 on the chemistry. He is cleared for surgery and requires no treatment.    Richardean Canal, MD 08/25/11 1245

## 2011-08-27 DIAGNOSIS — M25569 Pain in unspecified knee: Secondary | ICD-10-CM | POA: Diagnosis not present

## 2011-09-01 DIAGNOSIS — M25569 Pain in unspecified knee: Secondary | ICD-10-CM | POA: Diagnosis not present

## 2011-09-07 ENCOUNTER — Emergency Department (HOSPITAL_COMMUNITY)
Admission: EM | Admit: 2011-09-07 | Discharge: 2011-09-07 | Disposition: A | Discharge status: Home / Self Care | Payer: Medicare Other | Attending: Emergency Medicine | Admitting: Emergency Medicine

## 2011-09-07 ENCOUNTER — Encounter (HOSPITAL_COMMUNITY): Payer: Self-pay | Admitting: Emergency Medicine

## 2011-09-07 DIAGNOSIS — Z79899 Other long term (current) drug therapy: Secondary | ICD-10-CM | POA: Insufficient documentation

## 2011-09-07 DIAGNOSIS — M7989 Other specified soft tissue disorders: Secondary | ICD-10-CM | POA: Insufficient documentation

## 2011-09-07 DIAGNOSIS — Z8739 Personal history of other diseases of the musculoskeletal system and connective tissue: Secondary | ICD-10-CM | POA: Insufficient documentation

## 2011-09-07 DIAGNOSIS — K219 Gastro-esophageal reflux disease without esophagitis: Secondary | ICD-10-CM | POA: Insufficient documentation

## 2011-09-07 DIAGNOSIS — Z7982 Long term (current) use of aspirin: Secondary | ICD-10-CM | POA: Diagnosis not present

## 2011-09-07 DIAGNOSIS — F329 Major depressive disorder, single episode, unspecified: Secondary | ICD-10-CM | POA: Insufficient documentation

## 2011-09-07 DIAGNOSIS — G35 Multiple sclerosis: Secondary | ICD-10-CM | POA: Diagnosis not present

## 2011-09-07 DIAGNOSIS — F3289 Other specified depressive episodes: Secondary | ICD-10-CM | POA: Insufficient documentation

## 2011-09-07 MED ORDER — ENOXAPARIN SODIUM 100 MG/ML ~~LOC~~ SOLN
1.5000 mg/kg | Freq: Once | SUBCUTANEOUS | Status: DC
  Filled 2011-09-07: qty 2

## 2011-09-07 MED ORDER — ENOXAPARIN SODIUM 150 MG/ML ~~LOC~~ SOLN
1.5000 mg/kg | Freq: Once | SUBCUTANEOUS | Status: AC
  Administered 2011-09-07: 140 mg via SUBCUTANEOUS
  Filled 2011-09-07: qty 1

## 2011-09-07 NOTE — ED Notes (Signed)
Pt here with left left pain and swelling after having knee replacement 1.5 week ago; pt sent here by PT for eval for DVT

## 2011-09-07 NOTE — ED Provider Notes (Signed)
History     CSN: [id number]  Arrival date & time 09/07/11  1733   First MD Initiated Contact with Patient 09/07/11 2032      Chief Complaint  Patient presents with  . Leg Swelling    (Consider location/radiation/quality/duration/timing/severity/associated sxs/prior treatment) The history is provided by the patient.  pt reports left leg swelling and pain approximately 1.5 weeks s/p total left knee at [facility]. On prophylaxis dosing of lovenox (40mg  qday). No cp or SOB. No fevers or redness. Sent to ER by Physical therapist to evalute for DVT. Pain is mild in severity  Past Medical History  Diagnosis Date  . MS (multiple sclerosis)   . Arthritis   . Diastolic dysfunction   . Depression   . Right bundle branch block   . Brugada syndrome   . COPD (chronic obstructive pulmonary disease)   . GERD (gastroesophageal reflux disease)     Past Surgical History  Procedure Date  . Total knee arthroplasty   . Shoulder surgery     History reviewed. No pertinent family history.  History  Substance Use Topics  . Smoking status: Not on file  . Smokeless tobacco: Not on file  . Alcohol Use: No      Review of Systems  All other systems reviewed and are negative.    Allergies  Review of patient's allergies indicates no known allergies.  Home Medications   Current Outpatient Rx  Name Route Sig Dispense Refill  . ALPROSTADIL (VASODILATOR) 500 MCG UR PLLT Urethral Place 1,000 mcg into the urethra daily as needed. For erectile dysfunction    . ASPIRIN EC 81 MG PO TBEC Oral Take 81 mg by mouth every other day.    Marland Kitchen BISOPROLOL FUMARATE 5 MG PO TABS Oral Take 5 mg by mouth daily.    Marland Kitchen DIAZEPAM 5 MG PO TABS Oral Take 5 mg by mouth every 12 (twelve) hours as needed. For anxiety    . DIVALPROEX SODIUM ER 500 MG PO TB24 Oral Take 500 mg by mouth at bedtime.    . DONEPEZIL HCL 5 MG PO TABS Oral Take 10 mg by mouth at bedtime.    Marland Kitchen LOSARTAN POTASSIUM 50 MG PO TABS Oral Take 50 mg  by mouth daily.      Marland Kitchen METHOCARBAMOL 500 MG PO TABS Oral Take 500 mg by mouth 2 (two) times daily. For muscle spasms    . MODAFINIL 100 MG PO TABS Oral Take 150 mg by mouth daily.    Marland Kitchen OMEPRAZOLE 20 MG PO CPDR Oral Take 20 mg by mouth daily.      Marland Kitchen ROPINIROLE HCL 1 MG PO TABS Oral Take 1 mg by mouth at bedtime.    Marland Kitchen SILDENAFIL CITRATE 50 MG PO TABS Oral Take 50-100 mg by mouth daily as needed. For erectile dysfunction    . TIOTROPIUM BROMIDE MONOHYDRATE 18 MCG IN CAPS Inhalation Place 18 mcg into inhaler and inhale daily.    . VENLAFAXINE HCL 75 MG PO TABS Oral Take 150 mg by mouth 2 (two) times daily.       BP 110/60  Pulse 70  Temp 97.9 F (36.6 C) (Oral)  Resp 20  Ht 5\' 11"  (1.803 m)  Wt 205 lb (92.987 kg)  BMI 28.59 kg/m2  SpO2 95%  Physical Exam  Nursing note and vitals reviewed. Constitutional: He is oriented to person, place, and time. He appears well-developed and well-nourished.  HENT:  Head: Normocephalic and atraumatic.  Eyes: EOM are normal.  Neck: Normal range of motion.  Cardiovascular: Normal rate, regular rhythm, normal heart sounds and intact distal pulses.   Pulmonary/Chest: Effort normal and breath sounds normal. No respiratory distress.  Abdominal: Soft. He exhibits no distension. There is no tenderness.  Musculoskeletal: Normal range of motion.       Swelling of left lower leg as compared to right. Surgical incision over left knee without erythema, fluctuance or drainage. Pt is able to range the left knee. Normal motor, sensory and pulses in left lower extremity. No significant erythema or tenderness of left leg. Right leg with 1+ pitting edema, left with 2+  Neurological: He is alert and oriented to person, place, and time.  Skin: Skin is warm and dry.  Psychiatric: He has a normal mood and affect. Judgment normal.    ED Course  Procedures (including critical care time)  Labs Reviewed - No data to display No results found.   1. Swelling of left lower  extremity       MDM  The patient has evidence of left lower extremities swelling.  His duplex ultrasound was negative for proximal DVT but it was difficult to evaluate the distal veins.  His leg is significantly swollen.  My suspicion for infection is low.  Patient was given a treatment dose of Lovenox and was told to followup with his orthopedic surgeon tomorrow at [facility] for repeat evaluation and possibly repeat US. Elevation and compression stockings recommended        Lyanne Co, MD 09/10/11 [phone]

## 2011-09-07 NOTE — Progress Notes (Addendum)
*  Preliminary Results* Left lower extremity venous duplex completed. Left lower extremity is negative for deep vein thrombosis. There was limited visualization of the left distal femoral vein and no visualization of the left posterior tibial veins and left peroneal veins due to extensive swelling and pitting edema. Preliminary results discussed with RN Janett Billow.  09/07/2011 7:52 PM Gertie Fey, RDMS, RDCS

## 2011-09-08 DIAGNOSIS — G35 Multiple sclerosis: Secondary | ICD-10-CM | POA: Diagnosis not present

## 2011-09-14 DIAGNOSIS — M25569 Pain in unspecified knee: Secondary | ICD-10-CM | POA: Diagnosis not present

## 2011-09-15 DIAGNOSIS — M25569 Pain in unspecified knee: Secondary | ICD-10-CM | POA: Diagnosis not present

## 2011-09-16 DIAGNOSIS — M25569 Pain in unspecified knee: Secondary | ICD-10-CM | POA: Diagnosis not present

## 2011-09-21 DIAGNOSIS — H269 Unspecified cataract: Secondary | ICD-10-CM | POA: Diagnosis not present

## 2011-09-21 DIAGNOSIS — H43819 Vitreous degeneration, unspecified eye: Secondary | ICD-10-CM | POA: Diagnosis not present

## 2011-09-21 DIAGNOSIS — G35 Multiple sclerosis: Secondary | ICD-10-CM | POA: Diagnosis not present

## 2011-09-22 DIAGNOSIS — M25569 Pain in unspecified knee: Secondary | ICD-10-CM | POA: Diagnosis not present

## 2011-09-23 DIAGNOSIS — M25569 Pain in unspecified knee: Secondary | ICD-10-CM | POA: Diagnosis not present

## 2011-09-24 DIAGNOSIS — M25569 Pain in unspecified knee: Secondary | ICD-10-CM | POA: Diagnosis not present

## 2011-09-28 DIAGNOSIS — M25569 Pain in unspecified knee: Secondary | ICD-10-CM | POA: Diagnosis not present

## 2011-09-30 DIAGNOSIS — M25569 Pain in unspecified knee: Secondary | ICD-10-CM | POA: Diagnosis not present

## 2011-10-01 DIAGNOSIS — M25569 Pain in unspecified knee: Secondary | ICD-10-CM | POA: Diagnosis not present

## 2011-10-04 DIAGNOSIS — M25569 Pain in unspecified knee: Secondary | ICD-10-CM | POA: Diagnosis not present

## 2011-10-04 DIAGNOSIS — Z96659 Presence of unspecified artificial knee joint: Secondary | ICD-10-CM | POA: Diagnosis not present

## 2011-10-04 DIAGNOSIS — M25469 Effusion, unspecified knee: Secondary | ICD-10-CM | POA: Diagnosis not present

## 2011-10-05 DIAGNOSIS — M25569 Pain in unspecified knee: Secondary | ICD-10-CM | POA: Diagnosis not present

## 2011-10-07 DIAGNOSIS — M25569 Pain in unspecified knee: Secondary | ICD-10-CM | POA: Diagnosis not present

## 2011-10-12 DIAGNOSIS — M25569 Pain in unspecified knee: Secondary | ICD-10-CM | POA: Diagnosis not present

## 2011-10-14 DIAGNOSIS — M25569 Pain in unspecified knee: Secondary | ICD-10-CM | POA: Diagnosis not present

## 2011-10-19 DIAGNOSIS — M25569 Pain in unspecified knee: Secondary | ICD-10-CM | POA: Diagnosis not present

## 2011-10-21 DIAGNOSIS — M25569 Pain in unspecified knee: Secondary | ICD-10-CM | POA: Diagnosis not present

## 2011-10-26 DIAGNOSIS — M25569 Pain in unspecified knee: Secondary | ICD-10-CM | POA: Diagnosis not present

## 2011-10-27 DIAGNOSIS — Z23 Encounter for immunization: Secondary | ICD-10-CM | POA: Diagnosis not present

## 2011-10-28 DIAGNOSIS — M25569 Pain in unspecified knee: Secondary | ICD-10-CM | POA: Diagnosis not present

## 2011-11-02 DIAGNOSIS — M25569 Pain in unspecified knee: Secondary | ICD-10-CM | POA: Diagnosis not present

## 2011-11-18 DIAGNOSIS — N318 Other neuromuscular dysfunction of bladder: Secondary | ICD-10-CM | POA: Diagnosis not present

## 2011-11-18 DIAGNOSIS — N390 Urinary tract infection, site not specified: Secondary | ICD-10-CM | POA: Diagnosis not present

## 2011-11-18 DIAGNOSIS — G35 Multiple sclerosis: Secondary | ICD-10-CM | POA: Diagnosis not present

## 2011-11-18 DIAGNOSIS — R3989 Other symptoms and signs involving the genitourinary system: Secondary | ICD-10-CM | POA: Diagnosis not present

## 2011-11-25 DIAGNOSIS — Z8669 Personal history of other diseases of the nervous system and sense organs: Secondary | ICD-10-CM | POA: Diagnosis not present

## 2011-11-25 DIAGNOSIS — G35 Multiple sclerosis: Secondary | ICD-10-CM | POA: Diagnosis not present

## 2011-12-10 DIAGNOSIS — I10 Essential (primary) hypertension: Secondary | ICD-10-CM | POA: Diagnosis not present

## 2011-12-10 DIAGNOSIS — R7989 Other specified abnormal findings of blood chemistry: Secondary | ICD-10-CM | POA: Diagnosis not present

## 2011-12-10 DIAGNOSIS — F329 Major depressive disorder, single episode, unspecified: Secondary | ICD-10-CM | POA: Diagnosis not present

## 2011-12-14 DIAGNOSIS — N318 Other neuromuscular dysfunction of bladder: Secondary | ICD-10-CM | POA: Diagnosis not present

## 2011-12-14 DIAGNOSIS — N390 Urinary tract infection, site not specified: Secondary | ICD-10-CM | POA: Diagnosis not present

## 2011-12-14 DIAGNOSIS — N3941 Urge incontinence: Secondary | ICD-10-CM | POA: Diagnosis not present

## 2011-12-14 DIAGNOSIS — N320 Bladder-neck obstruction: Secondary | ICD-10-CM | POA: Diagnosis not present

## 2011-12-23 DIAGNOSIS — F639 Impulse disorder, unspecified: Secondary | ICD-10-CM | POA: Diagnosis not present

## 2011-12-23 DIAGNOSIS — F99 Mental disorder, not otherwise specified: Secondary | ICD-10-CM | POA: Diagnosis not present

## 2012-02-09 DIAGNOSIS — R339 Retention of urine, unspecified: Secondary | ICD-10-CM | POA: Diagnosis not present

## 2012-02-09 DIAGNOSIS — R39198 Other difficulties with micturition: Secondary | ICD-10-CM | POA: Diagnosis not present

## 2012-02-09 DIAGNOSIS — N3941 Urge incontinence: Secondary | ICD-10-CM | POA: Diagnosis not present

## 2012-02-09 DIAGNOSIS — N320 Bladder-neck obstruction: Secondary | ICD-10-CM | POA: Diagnosis not present

## 2012-02-09 DIAGNOSIS — N390 Urinary tract infection, site not specified: Secondary | ICD-10-CM | POA: Diagnosis not present

## 2012-02-23 DIAGNOSIS — F4320 Adjustment disorder, unspecified: Secondary | ICD-10-CM | POA: Diagnosis not present

## 2012-03-02 DIAGNOSIS — F4320 Adjustment disorder, unspecified: Secondary | ICD-10-CM | POA: Diagnosis not present

## 2012-03-07 DIAGNOSIS — E871 Hypo-osmolality and hyponatremia: Secondary | ICD-10-CM | POA: Diagnosis not present

## 2012-03-07 DIAGNOSIS — E875 Hyperkalemia: Secondary | ICD-10-CM | POA: Diagnosis not present

## 2012-03-09 DIAGNOSIS — F4320 Adjustment disorder, unspecified: Secondary | ICD-10-CM | POA: Diagnosis not present

## 2012-03-15 DIAGNOSIS — F4320 Adjustment disorder, unspecified: Secondary | ICD-10-CM | POA: Diagnosis not present

## 2012-03-17 DIAGNOSIS — E875 Hyperkalemia: Secondary | ICD-10-CM | POA: Diagnosis not present

## 2012-03-17 DIAGNOSIS — M949 Disorder of cartilage, unspecified: Secondary | ICD-10-CM | POA: Diagnosis not present

## 2012-03-17 DIAGNOSIS — E871 Hypo-osmolality and hyponatremia: Secondary | ICD-10-CM | POA: Diagnosis not present

## 2012-03-17 DIAGNOSIS — M899 Disorder of bone, unspecified: Secondary | ICD-10-CM | POA: Diagnosis not present

## 2012-03-20 DIAGNOSIS — G35 Multiple sclerosis: Secondary | ICD-10-CM | POA: Diagnosis not present

## 2012-04-06 DIAGNOSIS — F4320 Adjustment disorder, unspecified: Secondary | ICD-10-CM | POA: Diagnosis not present

## 2012-04-14 DIAGNOSIS — F4320 Adjustment disorder, unspecified: Secondary | ICD-10-CM | POA: Diagnosis not present

## 2012-04-27 ENCOUNTER — Telehealth: Payer: Self-pay | Admitting: Family Medicine

## 2012-04-27 MED ORDER — DIVALPROEX SODIUM ER 500 MG PO TB24: 500.0000 mg | ORAL_TABLET | Freq: Every day | ORAL | Status: DC

## 2012-04-27 NOTE — Telephone Encounter (Signed)
Wants to know if you can prescribe him a written RX for Depakote ER 500mg  #90 was originally written by Myrtie Neither says he is trying to get all his prescriptions from one doctor

## 2012-04-27 NOTE — Telephone Encounter (Signed)
i am willing to refill

## 2012-04-27 NOTE — Telephone Encounter (Signed)
Rx Refilled  

## 2012-05-02 DIAGNOSIS — F4320 Adjustment disorder, unspecified: Secondary | ICD-10-CM | POA: Diagnosis not present

## 2012-05-09 DIAGNOSIS — N401 Enlarged prostate with lower urinary tract symptoms: Secondary | ICD-10-CM | POA: Diagnosis not present

## 2012-05-09 DIAGNOSIS — N320 Bladder-neck obstruction: Secondary | ICD-10-CM | POA: Diagnosis not present

## 2012-05-09 DIAGNOSIS — N319 Neuromuscular dysfunction of bladder, unspecified: Secondary | ICD-10-CM | POA: Diagnosis not present

## 2012-05-09 DIAGNOSIS — N390 Urinary tract infection, site not specified: Secondary | ICD-10-CM | POA: Diagnosis not present

## 2012-05-09 DIAGNOSIS — N3941 Urge incontinence: Secondary | ICD-10-CM | POA: Diagnosis not present

## 2012-05-15 DIAGNOSIS — F4320 Adjustment disorder, unspecified: Secondary | ICD-10-CM | POA: Diagnosis not present

## 2012-05-27 ENCOUNTER — Other Ambulatory Visit: Payer: Self-pay | Admitting: Family Medicine

## 2012-05-31 DIAGNOSIS — F4320 Adjustment disorder, unspecified: Secondary | ICD-10-CM | POA: Diagnosis not present

## 2012-06-01 ENCOUNTER — Telehealth: Payer: Self-pay | Admitting: Family Medicine

## 2012-06-02 MED ORDER — HYDROCODONE-ACETAMINOPHEN 10-660 MG PO TABS: ORAL_TABLET | ORAL | Status: DC

## 2012-06-02 NOTE — Telephone Encounter (Signed)
Rx printed and wife will p/u this afternoon

## 2012-06-08 ENCOUNTER — Telehealth: Payer: Self-pay | Admitting: Family Medicine

## 2012-06-08 MED ORDER — VENLAFAXINE HCL 75 MG PO TABS: 150.0000 mg | ORAL_TABLET | Freq: Two times a day (BID) | ORAL | Status: DC

## 2012-06-08 NOTE — Telephone Encounter (Signed)
Med rf °

## 2012-06-09 DIAGNOSIS — F4320 Adjustment disorder, unspecified: Secondary | ICD-10-CM | POA: Diagnosis not present

## 2012-06-12 ENCOUNTER — Other Ambulatory Visit: Payer: Self-pay | Admitting: Family Medicine

## 2012-06-12 MED ORDER — VENLAFAXINE HCL 75 MG PO TABS: ORAL_TABLET | ORAL | Status: DC

## 2012-06-12 NOTE — Telephone Encounter (Signed)
Rx Refilled  

## 2012-06-14 ENCOUNTER — Encounter: Payer: Self-pay | Admitting: Family Medicine

## 2012-06-14 DIAGNOSIS — G35 Multiple sclerosis: Secondary | ICD-10-CM | POA: Diagnosis not present

## 2012-07-12 ENCOUNTER — Other Ambulatory Visit: Payer: Self-pay | Admitting: Family Medicine

## 2012-07-12 MED ORDER — MODAFINIL 100 MG PO TABS: 200.0000 mg | ORAL_TABLET | Freq: Every day | ORAL | Status: DC

## 2012-07-12 MED ORDER — DONEPEZIL HCL 5 MG PO TABS: 10.0000 mg | ORAL_TABLET | Freq: Every day | ORAL | Status: DC

## 2012-07-24 ENCOUNTER — Telehealth: Payer: Self-pay | Admitting: Family Medicine

## 2012-08-05 NOTE — Telephone Encounter (Signed)
reviewed

## 2012-08-29 DIAGNOSIS — N390 Urinary tract infection, site not specified: Secondary | ICD-10-CM | POA: Diagnosis not present

## 2012-08-29 DIAGNOSIS — N401 Enlarged prostate with lower urinary tract symptoms: Secondary | ICD-10-CM | POA: Diagnosis not present

## 2012-08-29 DIAGNOSIS — N3941 Urge incontinence: Secondary | ICD-10-CM | POA: Diagnosis not present

## 2012-08-29 DIAGNOSIS — G35 Multiple sclerosis: Secondary | ICD-10-CM | POA: Diagnosis not present

## 2012-08-29 DIAGNOSIS — J449 Chronic obstructive pulmonary disease, unspecified: Secondary | ICD-10-CM | POA: Diagnosis not present

## 2012-08-29 DIAGNOSIS — N320 Bladder-neck obstruction: Secondary | ICD-10-CM | POA: Diagnosis not present

## 2012-08-29 DIAGNOSIS — N318 Other neuromuscular dysfunction of bladder: Secondary | ICD-10-CM | POA: Diagnosis not present

## 2012-08-31 ENCOUNTER — Other Ambulatory Visit: Payer: Medicare Other

## 2012-08-31 ENCOUNTER — Other Ambulatory Visit: Payer: Self-pay | Admitting: Family Medicine

## 2012-08-31 DIAGNOSIS — Z79899 Other long term (current) drug therapy: Secondary | ICD-10-CM

## 2012-08-31 DIAGNOSIS — E782 Mixed hyperlipidemia: Secondary | ICD-10-CM

## 2012-08-31 DIAGNOSIS — I10 Essential (primary) hypertension: Secondary | ICD-10-CM

## 2012-09-13 ENCOUNTER — Other Ambulatory Visit: Payer: Self-pay | Admitting: Family Medicine

## 2012-09-13 DIAGNOSIS — N401 Enlarged prostate with lower urinary tract symptoms: Secondary | ICD-10-CM | POA: Diagnosis not present

## 2012-09-13 DIAGNOSIS — N35919 Unspecified urethral stricture, male, unspecified site: Secondary | ICD-10-CM | POA: Diagnosis not present

## 2012-09-13 DIAGNOSIS — Z79899 Other long term (current) drug therapy: Secondary | ICD-10-CM | POA: Diagnosis not present

## 2012-09-13 DIAGNOSIS — J449 Chronic obstructive pulmonary disease, unspecified: Secondary | ICD-10-CM | POA: Diagnosis not present

## 2012-09-13 DIAGNOSIS — D414 Neoplasm of uncertain behavior of bladder: Secondary | ICD-10-CM | POA: Diagnosis not present

## 2012-09-13 DIAGNOSIS — N320 Bladder-neck obstruction: Secondary | ICD-10-CM | POA: Diagnosis not present

## 2012-09-13 DIAGNOSIS — N3289 Other specified disorders of bladder: Secondary | ICD-10-CM | POA: Diagnosis not present

## 2012-09-13 DIAGNOSIS — G35 Multiple sclerosis: Secondary | ICD-10-CM | POA: Diagnosis not present

## 2012-09-13 DIAGNOSIS — N318 Other neuromuscular dysfunction of bladder: Secondary | ICD-10-CM | POA: Diagnosis not present

## 2012-09-13 NOTE — Telephone Encounter (Signed)
Ok to refill 

## 2012-09-14 DIAGNOSIS — N320 Bladder-neck obstruction: Secondary | ICD-10-CM | POA: Diagnosis not present

## 2012-09-14 DIAGNOSIS — J449 Chronic obstructive pulmonary disease, unspecified: Secondary | ICD-10-CM | POA: Diagnosis not present

## 2012-09-14 DIAGNOSIS — N401 Enlarged prostate with lower urinary tract symptoms: Secondary | ICD-10-CM | POA: Diagnosis not present

## 2012-09-14 DIAGNOSIS — G35 Multiple sclerosis: Secondary | ICD-10-CM | POA: Diagnosis not present

## 2012-09-14 DIAGNOSIS — N318 Other neuromuscular dysfunction of bladder: Secondary | ICD-10-CM | POA: Diagnosis not present

## 2012-09-14 DIAGNOSIS — N35919 Unspecified urethral stricture, male, unspecified site: Secondary | ICD-10-CM | POA: Diagnosis not present

## 2012-09-14 NOTE — Telephone Encounter (Signed)
ok 

## 2012-09-18 DIAGNOSIS — N320 Bladder-neck obstruction: Secondary | ICD-10-CM | POA: Diagnosis not present

## 2012-10-06 ENCOUNTER — Ambulatory Visit (INDEPENDENT_AMBULATORY_CARE_PROVIDER_SITE_OTHER): Payer: Medicare Other | Admitting: Family Medicine

## 2012-10-06 VITALS — BP 138/64 | HR 80 | Temp 98.6°F | Resp 18 | Wt 210.0 lb

## 2012-10-06 DIAGNOSIS — Z79899 Other long term (current) drug therapy: Secondary | ICD-10-CM | POA: Diagnosis not present

## 2012-10-06 DIAGNOSIS — R5381 Other malaise: Secondary | ICD-10-CM

## 2012-10-06 LAB — CBC WITH DIFFERENTIAL/PLATELET
Basophils Absolute: 0.1 10*3/uL (ref 0.0–0.1)
Eosinophils Absolute: 1 10*3/uL — ABNORMAL HIGH (ref 0.0–0.7)
Eosinophils Relative: 11 % — ABNORMAL HIGH (ref 0–5)
HCT: 39.6 % (ref 39.0–52.0)
Lymphocytes Relative: 24 % (ref 12–46)
Lymphs Abs: 2.1 10*3/uL (ref 0.7–4.0)
MCH: 30.1 pg (ref 26.0–34.0)
MCHC: 34.1 g/dL (ref 30.0–36.0)
MCV: 88.2 fL (ref 78.0–100.0)
Monocytes Absolute: 0.7 10*3/uL (ref 0.1–1.0)
Platelets: 417 10*3/uL — ABNORMAL HIGH (ref 150–400)
RDW: 15.2 % (ref 11.5–15.5)

## 2012-10-07 LAB — COMPLETE METABOLIC PANEL WITH GFR
ALT: 16 U/L (ref 0–53)
CO2: 23 mEq/L (ref 19–32)
Calcium: 9.2 mg/dL (ref 8.4–10.5)
Chloride: 100 mEq/L (ref 96–112)
GFR, Est African American: 76 mL/min
GFR, Est Non African American: 66 mL/min
Glucose, Bld: 134 mg/dL — ABNORMAL HIGH (ref 70–99)
Sodium: 134 mEq/L — ABNORMAL LOW (ref 135–145)
Total Bilirubin: 0.3 mg/dL (ref 0.3–1.2)
Total Protein: 7.1 g/dL (ref 6.0–8.3)

## 2012-10-07 LAB — CORTISOL: Cortisol, Plasma: 11.4 ug/dL

## 2012-10-07 LAB — VITAMIN D 25 HYDROXY (VIT D DEFICIENCY, FRACTURES): Vit D, 25-Hydroxy: 47 ng/mL (ref 30–89)

## 2012-10-07 LAB — TESTOSTERONE: Testosterone: 202 ng/dL — ABNORMAL LOW (ref 300–890)

## 2012-10-08 ENCOUNTER — Encounter: Payer: Self-pay | Admitting: Family Medicine

## 2012-10-08 DIAGNOSIS — R339 Retention of urine, unspecified: Secondary | ICD-10-CM | POA: Insufficient documentation

## 2012-10-08 DIAGNOSIS — F329 Major depressive disorder, single episode, unspecified: Secondary | ICD-10-CM | POA: Insufficient documentation

## 2012-10-08 DIAGNOSIS — N400 Enlarged prostate without lower urinary tract symptoms: Secondary | ICD-10-CM | POA: Insufficient documentation

## 2012-10-08 DIAGNOSIS — F32A Depression, unspecified: Secondary | ICD-10-CM | POA: Insufficient documentation

## 2012-10-08 NOTE — Progress Notes (Signed)
Subjective:    Patient ID: [name] [name], male    DOB: [birth year], 72 y.o.   MRN: [id number]  HPI Patient is a 72 year old white male who has a history of multiple sclerosis. He is complaining about fatigue over the last 5 years on numerous occasions however he states it has become extremely severe and his change from his baseline. He attributed the fatigue in the past to multiple sclerosis.  I checked CBC, CMP, TSH, B12, testosterone in the past and found to be normal. An echocardiogram has revealed diastolic dysfunction but no congestive heart failure.  He has never been assessed for sleep apnea or adrenal insufficiency.  He is currently under treatment for depression. He is on Effexor X. are 150 mg by mouth twice a day. He also uses Depakote extended release 500 mg by mouth each bedtime as a mood stabilizer. He is seeing a therapist. He as depression with bipolar tendencies. He states that his depression is worsening. However he attributes this to the worsening fatigue. He states that he has no energy or desire to do anything. Previously he is a very active man and this only makes his depression worse. He is also open to the possibility that the depression may be causing his fatigue. He is currently on Provigil for fatigue related to multiple sclerosis. However the patient does not feel that this is benefiting him at all. He has been on this for years and they have become tolerant to the medication. Past Medical History  Diagnosis Date  . MS (multiple sclerosis)   . Arthritis   . Diastolic dysfunction   . Right bundle branch block   . Brugada syndrome   . COPD (chronic obstructive pulmonary disease)   . GERD (gastroesophageal reflux disease)   . Depression     with bipolar tendencies  . Urinary retention with incomplete bladder emptying     receives botox injections and treatment for BPH through [facility]  . BPH (benign prostatic hyperplasia)    Past Surgical History  Procedure Laterality  Date  . Total knee arthroplasty    . Shoulder surgery    . Prostate surgery      TURP   Current Outpatient Prescriptions on File Prior to Visit  Medication Sig Dispense Refill  . aspirin EC 81 MG tablet Take 81 mg by mouth every other day.      . bisoprolol (ZEBETA) 5 MG tablet TAKE 1/2 TABLET BY MOUTH EVERY DAY  45 tablet  2  . diazepam (VALIUM) 5 MG tablet Take 5 mg by mouth every 12 (twelve) hours as needed. For anxiety      . divalproex (DEPAKOTE ER) 500 MG 24 hr tablet Take 1 tablet (500 mg total) by mouth at bedtime.  90 tablet  1  . donepezil (ARICEPT) 5 MG tablet TAKE 2 TABLETS BY MOUTH AT BEDTIME  60 tablet  1  . Hydrocodone-Acetaminophen 10-660 MG TABS 1 tab po q 4-6 hours prn pain  100 each  0  . modafinil (PROVIGIL) 100 MG tablet Take 2 tablets (200 mg total) by mouth daily.  180 tablet  3  . omeprazole (PRILOSEC) 20 MG capsule Take 20 mg by mouth daily.        Marland Kitchen rOPINIRole (REQUIP) 1 MG tablet Take 1 mg by mouth at bedtime.      Marland Kitchen tiotropium (SPIRIVA) 18 MCG inhalation capsule Place 18 mcg into inhaler and inhale daily.      Marland Kitchen venlafaxine XR (EFFEXOR-XR) 150 MG 24  hr capsule Take 150 mg by mouth 2 (two) times daily.       No current facility-administered medications on file prior to visit.   No Known Allergies History   Social History  . Marital Status: Married    Spouse Name: N/A    Number of Children: N/A  . Years of Education: N/A   Occupational History  . Not on file.   Social History Main Topics  . Smoking status: Former Games developer  . Smokeless tobacco: Not on file  . Alcohol Use: No  . Drug Use: Not on file  . Sexual Activity: Not on file   Other Topics Concern  . Not on file   Social History Narrative  . No narrative on file      Review of Systems  All other systems reviewed and are negative.       Objective:   Physical Exam  Vitals reviewed. Constitutional: He is oriented to person, place, and time. He appears well-developed and  well-nourished. No distress.  HENT:  Head: Normocephalic.  Right Ear: External ear normal.  Left Ear: External ear normal.  Nose: Nose normal.  Mouth/Throat: Oropharynx is clear and moist. No oropharyngeal exudate.  Eyes: Conjunctivae are normal. Pupils are equal, round, and reactive to light. Right eye exhibits no discharge. Left eye exhibits no discharge. No scleral icterus.  Neck: Normal range of motion. Neck supple. No JVD present. No thyromegaly present.  Cardiovascular: Normal rate, regular rhythm and normal heart sounds.  Exam reveals no gallop and no friction rub.   No murmur heard. Pulmonary/Chest: Effort normal and breath sounds normal. No respiratory distress. He has no wheezes. He has no rales. He exhibits no tenderness.  Abdominal: Soft. Bowel sounds are normal. He exhibits no distension and no mass. There is no tenderness. There is no rebound and no guarding.  Musculoskeletal: Normal range of motion. He exhibits no edema and no tenderness.  Lymphadenopathy:    He has no cervical adenopathy.  Neurological: He is alert and oriented to person, place, and time. He has normal reflexes. No cranial nerve deficit. He exhibits normal muscle tone. Coordination normal.  Skin: Skin is warm. No rash noted. He is not diaphoretic.  Psychiatric: He has a normal mood and affect. His behavior is normal. Judgment and thought content normal.          Assessment & Plan:  1. Other malaise and fatigue This is a difficult symptom. The differential diagnosis in this situation is large. I am concerned the patient's fatigue could be related to his depression. I will begin a medical workup with a CBC to rule out anemia and bone marrow abnormalities, a CMP to evaluate renal and liver function, a TSH to screen for hypothyroidism, a B12 level to screen for B12 deficiency, a testosterone level to screen for hypergonadism, and a cortisol level to screen for adrenal insufficiency. Of note the patient has been  told he has been borderline hyperkalemia at several offices visits with other doctors.  If the lab work is negative, we could consider a chest x-ray to rule out lung cancer, colonoscopy to rule out colon cancer, a sleep study to rule out sleep apnea. I discussed with the patient adding Wellbutrin to his Effexor to help augment the treatment for his depression. We could also consider discontinuing Provigil and switching to an immediate release Adderall twice a day.  For the present time I will obtain the lab work mentioned above. If the lab work is normal  I will temporarily try adding Wellbutrin. If no benefit is seen in 4 weeks, we can begin a workup for occult malignancy and possibly switch Provigil to Adderall. - COMPLETE METABOLIC PANEL WITH GFR - CBC with Differential - TSH - Vitamin B12 - Testosterone - Vit D  25 hydroxy (rtn osteoporosis monitoring) - Cortisol

## 2012-10-11 ENCOUNTER — Other Ambulatory Visit: Payer: Self-pay | Admitting: Family Medicine

## 2012-10-11 DIAGNOSIS — R7989 Other specified abnormal findings of blood chemistry: Secondary | ICD-10-CM

## 2012-10-12 ENCOUNTER — Other Ambulatory Visit: Payer: Medicare Other

## 2012-10-12 ENCOUNTER — Encounter: Payer: Self-pay | Admitting: Physician Assistant

## 2012-10-12 ENCOUNTER — Ambulatory Visit (INDEPENDENT_AMBULATORY_CARE_PROVIDER_SITE_OTHER): Payer: Medicare Other | Admitting: Physician Assistant

## 2012-10-12 VITALS — BP 164/84 | HR 60 | Temp 97.4°F | Resp 18 | Wt 212.0 lb

## 2012-10-12 DIAGNOSIS — R7989 Other specified abnormal findings of blood chemistry: Secondary | ICD-10-CM

## 2012-10-12 DIAGNOSIS — E291 Testicular hypofunction: Secondary | ICD-10-CM | POA: Diagnosis not present

## 2012-10-12 DIAGNOSIS — H60399 Other infective otitis externa, unspecified ear: Secondary | ICD-10-CM | POA: Diagnosis not present

## 2012-10-12 DIAGNOSIS — H60391 Other infective otitis externa, right ear: Secondary | ICD-10-CM

## 2012-10-12 LAB — FSH/LH
FSH: 6.3 m[IU]/mL (ref 1.4–18.1)
LH: 5.6 m[IU]/mL (ref 3.1–34.6)

## 2012-10-12 LAB — TESTOSTERONE: Testosterone: 295 ng/dL — ABNORMAL LOW (ref 300–890)

## 2012-10-12 LAB — HEMOGLOBIN A1C: Mean Plasma Glucose: 128 mg/dL — ABNORMAL HIGH (ref <117)

## 2012-10-12 MED ORDER — OFLOXACIN 0.3 % OT SOLN: 5.0000 [drp] | Freq: Two times a day (BID) | OTIC | Status: DC

## 2012-10-12 NOTE — Progress Notes (Signed)
Patient ID: [name] [name] MRN: [id number], DOB: 01/20/1940, 72 y.o. Date of Encounter: 10/12/2012, 11:17 AM    Chief Complaint:  Chief Complaint  Patient presents with  . c/o rt inner ear pain     HPI: 72 y.o. 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 However says that he hasn't had much problem with this for 10 years. Says in the past it would flare rapidly. He learned to use hydrogen peroxide and alcohol as soon as symptoms would start and often this would treat the problem. He recently developed pain and swelling of the external right ear canal. He treated it with the hydrogen peroxide and alcohol. However this time  this did not completely treat the problem. Still having swelling and therefore decreased hearing from the ear. The pain in the right ear has decreased. Did have some sensation of a deeper earache but says that this has resolved. Says in general all the pain has decreased.     Home Meds: See attached medication section for any medications that were entered at today's visit. The computer does not put those onto this list.The following list is a list of meds entered prior to today's visit.   Current Outpatient Prescriptions on File Prior to Visit  Medication Sig Dispense Refill  . aspirin EC 81 MG tablet Take 81 mg by mouth every other day.      . bisoprolol (ZEBETA) 5 MG tablet TAKE 1/2 TABLET BY MOUTH EVERY DAY  45 tablet  2  . diazepam (VALIUM) 5 MG tablet Take 5 mg by mouth every 12 (twelve) hours as needed. For anxiety      . divalproex (DEPAKOTE ER) 500 MG 24 hr tablet Take 1 tablet (500 mg total) by mouth at bedtime.  90 tablet  1  . donepezil (ARICEPT) 5 MG tablet TAKE 2 TABLETS BY MOUTH AT BEDTIME  60 tablet  1  . Hydrocodone-Acetaminophen 10-660 MG TABS 1 tab po q 4-6 hours prn pain  100 each  0  . modafinil (PROVIGIL) 100 MG tablet Take 2 tablets (200 mg total) by mouth daily.  180 tablet  3  . omeprazole  (PRILOSEC) 20 MG capsule Take 20 mg by mouth daily.        Marland Kitchen rOPINIRole (REQUIP) 1 MG tablet Take 1 mg by mouth at bedtime.      Marland Kitchen tiotropium (SPIRIVA) 18 MCG inhalation capsule Place 18 mcg into inhaler and inhale daily.      Marland Kitchen venlafaxine XR (EFFEXOR-XR) 150 MG 24 hr capsule Take 150 mg by mouth 2 (two) times daily.       No current facility-administered medications on file prior to visit.    Allergies: No Known Allergies    Review of Systems: See HPI for pertinent ROS. All other ROS negative.    Physical Exam: Blood pressure 164/84, pulse 60, temperature 97.4 F (36.3 C), temperature source Oral, resp. rate 18, weight 212 lb (96.163 kg)., Body mass index is 29.58 kg/(m^2). General: Well-nourished well-developed white male. Appears in no acute distress. HEENT: Normocephalic, atraumatic, eyes without discharge, sclera non-icteric, nares are without discharge.  Left ear: No tenderness with palpation of the external ear. Ear canal is normal. No edema and no purulent drainage. TM is well-visualized and normal. Right ear: Palpation of the external ear reveals no significant tenderness. Even palpation of the tragus is not tender. He says that area was tender several days ago. The  ear canal has mild edema. Large amount of purulent drainage is present in the ear canal. I can only visualize part of the TM secondary to the edema of the canal. The TM appears to be intact without perforation. I see no signs of infection of the TM but again the exam is somewhat limited secondary to the edema.  Oral cavity moist, posterior pharynx without exudate, erythema, peritonsillar abscess, or post nasal drip.  Neck: Supple. No thyromegaly. No lymphadenopathy. Lungs: Clear bilaterally to auscultation without wheezes, rales, or rhonchi. Breathing is unlabored. Heart: Regular rhythm. No murmurs, rubs, or gallops. Msk:  Strength and tone normal for age. Extremities/Skin: Warm and dry.  Neuro: Alert and oriented X  3. Moves all extremities spontaneously. Gait is normal. CNII-XII grossly in tact. Psych:  Responds to questions appropriately with a normal affect.     ASSESSMENT AND PLAN:  72 y.o. year old male with  1. Otitis, externa, infective, right - ofloxacin (FLOXIN) 0.3 % otic solution; Place 5 drops into the right ear 2 (two) times daily.  Dispense: 5 mL; Refill: 0 Follow up if symptoms worsen or do not resolve.  Signed, [address] [place], [place], [facility] 10/12/2012 11:17 AM

## 2012-10-17 ENCOUNTER — Encounter: Payer: Self-pay | Admitting: Family Medicine

## 2012-10-17 ENCOUNTER — Ambulatory Visit (INDEPENDENT_AMBULATORY_CARE_PROVIDER_SITE_OTHER): Payer: Medicare Other | Admitting: Family Medicine

## 2012-10-17 VITALS — BP 110/78 | HR 88 | Temp 97.3°F | Resp 16 | Ht 71.0 in | Wt 218.0 lb

## 2012-10-17 DIAGNOSIS — E291 Testicular hypofunction: Secondary | ICD-10-CM

## 2012-10-17 DIAGNOSIS — R7989 Other specified abnormal findings of blood chemistry: Secondary | ICD-10-CM

## 2012-10-17 DIAGNOSIS — Z23 Encounter for immunization: Secondary | ICD-10-CM | POA: Diagnosis not present

## 2012-10-17 MED ORDER — TESTOSTERONE CYPIONATE 200 MG/ML IM SOLN: 200.0000 mg | INTRAMUSCULAR | Status: DC

## 2012-10-17 NOTE — Progress Notes (Signed)
Subjective:    Patient ID: [name] [name], male    DOB: 03-16-1940, 72 y.o.   MRN: [id number]  HPI 10/06/12 Patient is a 72 year old white male who has a history of multiple sclerosis. He is complaining about fatigue over the last 5 years on numerous occasions however he states it has become extremely severe and his change from his baseline. He attributed the fatigue in the past to multiple sclerosis.  I checked CBC, CMP, TSH, B12, testosterone in the past and found to be normal. An echocardiogram has revealed diastolic dysfunction but no congestive heart failure.  He has never been assessed for sleep apnea or adrenal insufficiency.  He is currently under treatment for depression. He is on Effexor X. are 150 mg by mouth twice a day. He also uses Depakote extended release 500 mg by mouth each bedtime as a mood stabilizer. He is seeing a therapist. He as depression with bipolar tendencies. He states that his depression is worsening. However he attributes this to the worsening fatigue. He states that he has no energy or desire to do anything. Previously he is a very active man and this only makes his depression worse. He is also open to the possibility that the depression may be causing his fatigue. He is currently on Provigil for fatigue related to multiple sclerosis. However the patient does not feel that this is benefiting him at all. He has been on this for years and they have become tolerant to the medication. At that time, my plan was: 1. Other malaise and fatigue This is a difficult symptom. The differential diagnosis in this situation is large. I am concerned the patient's fatigue could be related to his depression. I will begin a medical workup with a CBC to rule out anemia and bone marrow abnormalities, a CMP to evaluate renal and liver function, a TSH to screen for hypothyroidism, a B12 level to screen for B12 deficiency, a testosterone level to screen for hypergonadism, and a cortisol level to  screen for adrenal insufficiency. Of note the patient has been told he has been borderline hyperkalemia at several offices visits with other doctors.  If the lab work is negative, we could consider a chest x-ray to rule out lung cancer, colonoscopy to rule out colon cancer, a sleep study to rule out sleep apnea. I discussed with the patient adding Wellbutrin to his Effexor to help augment the treatment for his depression. We could also consider discontinuing Provigil and switching to an immediate release Adderall twice a day.  For the present time I will obtain the lab work mentioned above. If the lab work is normal I will temporarily try adding Wellbutrin. If no benefit is seen in 4 weeks, we can begin a workup for occult malignancy and possibly switch Provigil to Adderall. - COMPLETE METABOLIC PANEL WITH GFR - CBC with Differential - TSH - Vitamin B12 - Testosterone - Vit D  25 hydroxy (rtn osteoporosis monitoring) - Cortisol Results of the patient labwork as listed below: Lab on 10/12/2012  Component Date Value Range Status  . Testosterone 10/12/2012 295* 300 - 890 ng/dL Final   Comment:           Michiel Sites       Male              Male  I              < 30 ng/dL        < 10 ng/dL                                        II             < 150 ng/dL       < 30 ng/dL                                        III            100-320 ng/dL     < 35 ng/dL                                        IV             200-970 ng/dL     21-30 ng/dL                                        V/Adult        300-890 ng/dL     86-57 ng/dL                             . FSH 10/12/2012 6.3  1.4 - 18.1 mIU/mL Final   Comment: Reference Ranges:                                   Male:                         1.4 -  18.1 mIU/mL                                   Male:   Follicular Phase    2.5 -  10.2 mIU/mL                                             MidCycle Peak       3.4 -  33.4  mIU/mL                                             Luteal Phase        1.5 -   9.1 mIU/mL                                             Post Menopausal    23.0 - 116.3 mIU/mL  Pregnant                <   0.3 mIU/mL  . LH 10/12/2012 5.6  3.1 - 34.6 mIU/mL Final   Comment: Reference Ranges:                                   Male:     20 - 70 Years           1.5 -  9.3 mIU/mL                                                > 70 Years           3.1 - 34.6 mIU/mL                                   Male:   Follicular Phase        1.9 - 12.5 mIU/mL                                             Midcycle                8.7 - 76.3 mIU/mL                                             Luteal Phase            0.5 - 16.9 mIU/mL                                             Post Menopausal        15.9 - 54.0 mIU/mL                                             Pregnant                    <  1.5 mIU/mL                                             Contraceptives          0.7 -  5.6 mIU/mL                                   Children:                             <  6.0 mIU/mL                             .  Prolactin 10/12/2012 7.5  2.1 - 17.1 ng/mL Final   Comment:      Reference Ranges:                                           Male:                       2.1 -  17.1 ng/ml                                           Male:   Pregnant          9.7 - 208.5 ng/mL                                                     Non Pregnant      2.8 -  29.2 ng/mL                                                     Post Menopausal   1.8 -  20.3 ng/mL                                              . Hemoglobin A1C 10/12/2012 6.1* <5.7 % Final   Comment:                                                                                                 According to the ADA Clinical Practice Recommendations for 2011, when                          HbA1c is used as a screening test:                                                        >=6.5%   Diagnostic of Diabetes Mellitus                                     (if abnormal result is confirmed)  5.7-6.4%   Increased risk of developing Diabetes Mellitus                                                     References:Diagnosis and Classification of Diabetes Mellitus,Diabetes                          Care,2011,34(Suppl 1):S62-S69 and Standards of Medical Care in                                  Diabetes - 2011,Diabetes Care,2011,34 (Suppl 1):S11-S61.                             . Mean Plasma Glucose 10/12/2012 128* <117 mg/dL Final  Office Visit on 10/06/2012  Component Date Value Range Status  . Sodium 10/06/2012 134* 135 - 145 mEq/L Final  . Potassium 10/06/2012 4.5  3.5 - 5.3 mEq/L Final  . Chloride 10/06/2012 100  96 - 112 mEq/L Final  . CO2 10/06/2012 23  19 - 32 mEq/L Final  . Glucose, Bld 10/06/2012 134* 70 - 99 mg/dL Final  . BUN 16/10/9602 24* 6 - 23 mg/dL Final  . Creat 54/09/8117 1.12  0.50 - 1.35 mg/dL Final  . Total Bilirubin 10/06/2012 0.3  0.3 - 1.2 mg/dL Final  . Alkaline Phosphatase 10/06/2012 108  39 - 117 U/L Final  . AST 10/06/2012 22  0 - 37 U/L Final  . ALT 10/06/2012 16  0 - 53 U/L Final  . Total Protein 10/06/2012 7.1  6.0 - 8.3 g/dL Final  . Albumin 14/78/2956 4.2  3.5 - 5.2 g/dL Final  . Calcium 21/30/8657 9.2  8.4 - 10.5 mg/dL Final  . GFR, Est African American 10/06/2012 76   Final  . GFR, Est Non African American 10/06/2012 66   Final   Comment:                            The estimated GFR is a calculation valid for adults (>=97 years old)                          that uses the CKD-EPI algorithm to adjust for age and sex. It is                            not to be used for children, pregnant women, hospitalized patients,                             patients on dialysis, or with rapidly changing kidney function.                          According to the NKDEP,  eGFR >89 is normal, 60-89 shows mild                          impairment, 30-59 shows moderate impairment, 15-29 shows severe  impairment and <15 is ESRD.                             . WBC 10/06/2012 8.6  4.0 - 10.5 K/uL Final  . RBC 10/06/2012 4.49  4.22 - 5.81 MIL/uL Final  . Hemoglobin 10/06/2012 13.5  13.0 - 17.0 g/dL Final  . HCT 16/10/9602 39.6  39.0 - 52.0 % Final  . MCV 10/06/2012 88.2  78.0 - 100.0 fL Final  . MCH 10/06/2012 30.1  26.0 - 34.0 pg Final  . MCHC 10/06/2012 34.1  30.0 - 36.0 g/dL Final  . RDW 54/09/8117 15.2  11.5 - 15.5 % Final  . Platelets 10/06/2012 417* 150 - 400 K/uL Final  . Neutrophils Relative % 10/06/2012 55  43 - 77 % Final  . Neutro Abs 10/06/2012 4.7  1.7 - 7.7 K/uL Final  . Lymphocytes Relative 10/06/2012 24  12 - 46 % Final  . Lymphs Abs 10/06/2012 2.1  0.7 - 4.0 K/uL Final  . Monocytes Relative 10/06/2012 9  3 - 12 % Final  . Monocytes Absolute 10/06/2012 0.7  0.1 - 1.0 K/uL Final  . Eosinophils Relative 10/06/2012 11* 0 - 5 % Final  . Eosinophils Absolute 10/06/2012 1.0* 0.0 - 0.7 K/uL Final  . Basophils Relative 10/06/2012 1  0 - 1 % Final  . Basophils Absolute 10/06/2012 0.1  0.0 - 0.1 K/uL Final  . Smear Review 10/06/2012 Criteria for review not met   Final  . TSH 10/06/2012 2.957  0.350 - 4.500 uIU/mL Final  . Vitamin B-12 10/06/2012 1091* 211 - 911 pg/mL Final  . Testosterone 10/06/2012 202* 300 - 890 ng/dL Final   Comment:           Tanner Stage       Male              Male                                        I              < 30 ng/dL        < 10 ng/dL                                        II             < 150 ng/dL       < 30 ng/dL                                        III            100-320 ng/dL     < 35 ng/dL                                        IV             200-970 ng/dL     14-78 ng/dL  V/Adult        300-890 ng/dL     16-10 ng/dL                             .  Vit D, 25-Hydroxy 10/06/2012 47  30 - 89 ng/mL Final   Comment: This assay accurately quantifies Vitamin D, which is the sum of the                          25-Hydroxy forms of Vitamin D2 and D3.  Studies have shown that the                          optimum concentration of 25-Hydroxy Vitamin D is 30 ng/mL or higher.                           Concentrations of Vitamin D between 20 and 29 ng/mL are considered to                          be insufficient and concentrations less than 20 ng/mL are considered                          to be deficient for Vitamin D.  . Cortisol, Plasma 10/06/2012 11.4   Final   Comment:                            AM:  4.3 - 22.4 ug/dL                          PM:  3.1 - 16.7 ug/dL   He is here today to discuss treatment for low testosterone. Past Medical History  Diagnosis Date  . MS (multiple sclerosis)   . Arthritis   . Diastolic dysfunction   . Right bundle branch block   . Brugada syndrome   . COPD (chronic obstructive pulmonary disease)   . GERD (gastroesophageal reflux disease)   . Depression     with bipolar tendencies  . Urinary retention with incomplete bladder emptying     receives botox injections and treatment for BPH through [facility]  . BPH (benign prostatic hyperplasia)    Past Surgical History  Procedure Laterality Date  . Total knee arthroplasty    . Shoulder surgery    . Prostate surgery      TURP   Current Outpatient Prescriptions on File Prior to Visit  Medication Sig Dispense Refill  . aspirin EC 81 MG tablet Take 81 mg by mouth every other day.      . bisoprolol (ZEBETA) 5 MG tablet TAKE 1/2 TABLET BY MOUTH EVERY DAY  45 tablet  2  . diazepam (VALIUM) 5 MG tablet Take 5 mg by mouth every 12 (twelve) hours as needed. For anxiety      . divalproex (DEPAKOTE ER) 500 MG 24 hr tablet Take 1 tablet (500 mg total) by mouth at bedtime.  90 tablet  1  . donepezil (ARICEPT) 5 MG tablet TAKE 2 TABLETS BY MOUTH AT BEDTIME  60 tablet  1  .  Hydrocodone-Acetaminophen 10-660 MG TABS 1 tab po q 4-6 hours prn pain  100 each  0  . modafinil (  PROVIGIL) 100 MG tablet Take 2 tablets (200 mg total) by mouth daily.  180 tablet  3  . ofloxacin (FLOXIN) 0.3 % otic solution Place 5 drops into the right ear 2 (two) times daily.  5 mL  0  . omeprazole (PRILOSEC) 20 MG capsule Take 20 mg by mouth daily.        Marland Kitchen rOPINIRole (REQUIP) 1 MG tablet Take 1 mg by mouth at bedtime.      Marland Kitchen tiotropium (SPIRIVA) 18 MCG inhalation capsule Place 18 mcg into inhaler and inhale daily.      Marland Kitchen venlafaxine XR (EFFEXOR-XR) 150 MG 24 hr capsule Take 150 mg by mouth 2 (two) times daily.       No current facility-administered medications on file prior to visit.   No Known Allergies History   Social History  . Marital Status: Married    Spouse Name: N/A    Number of Children: N/A  . Years of Education: N/A   Occupational History  . Not on file.   Social History Main Topics  . Smoking status: Former Games developer  . Smokeless tobacco: Not on file  . Alcohol Use: No  . Drug Use: Not on file  . Sexual Activity: Not on file   Other Topics Concern  . Not on file   Social History Narrative  . No narrative on file      Review of Systems  All other systems reviewed and are negative.       Objective:   Physical Exam  Vitals reviewed. Constitutional: He is oriented to person, place, and time. He appears well-developed and well-nourished. No distress.  HENT:  Head: Normocephalic.  Right Ear: External ear normal.  Left Ear: External ear normal.  Nose: Nose normal.  Mouth/Throat: Oropharynx is clear and moist. No oropharyngeal exudate.  Eyes: Conjunctivae are normal. Pupils are equal, round, and reactive to light. Right eye exhibits no discharge. Left eye exhibits no discharge. No scleral icterus.  Neck: Normal range of motion. Neck supple. No JVD present. No thyromegaly present.  Cardiovascular: Normal rate, regular rhythm and normal heart sounds.  Exam  reveals no gallop and no friction rub.   No murmur heard. Pulmonary/Chest: Effort normal and breath sounds normal. No respiratory distress. He has no wheezes. He has no rales. He exhibits no tenderness.  Abdominal: Soft. Bowel sounds are normal. He exhibits no distension and no mass. There is no tenderness. There is no rebound and no guarding.  Musculoskeletal: Normal range of motion. He exhibits no edema and no tenderness.  Lymphadenopathy:    He has no cervical adenopathy.  Neurological: He is alert and oriented to person, place, and time. He has normal reflexes. No cranial nerve deficit. He exhibits normal muscle tone. Coordination normal.  Skin: Skin is warm. No rash noted. He is not diaphoretic.  Psychiatric: He has a normal mood and affect. His behavior is normal. Judgment and thought content normal.          Assessment & Plan:  1. Low testosterone I discussed in detail the risk of testosterone replacement including increased risk of cardiovascular disease, prostate enlargement, aggression, and elevated hemoglobin. I also discussed the fact that the patient's LH and FSH were normal making low testosterone an unlikely cause of his fatigue. He is still interested in trying treatment due to the severity of his fatigue. Therefore we'll begin testosterone cypionate 200 mg IM every 2 weeks and recheck labs in 3 months. If the patient's symptoms do not  improve I would discontinue testosterone. I would next consider sleep study to evaluate for sleep apnea. I will also consider discontinuing provigil and replacing it with Adderall.  I also consider supplementing his antidepressant with Wellbutrin. However, we will approach each of these issues in turn. - testosterone cypionate (DEPOTESTOTERONE CYPIONATE) 200 MG/ML injection; Inject 1 mL (200 mg total) into the muscle every 14 (fourteen) days.  Dispense: 10 mL; Refill: 0

## 2012-10-19 DIAGNOSIS — N3941 Urge incontinence: Secondary | ICD-10-CM | POA: Diagnosis not present

## 2012-10-19 DIAGNOSIS — N320 Bladder-neck obstruction: Secondary | ICD-10-CM | POA: Diagnosis not present

## 2012-10-19 DIAGNOSIS — N318 Other neuromuscular dysfunction of bladder: Secondary | ICD-10-CM | POA: Diagnosis not present

## 2012-11-02 DIAGNOSIS — G35 Multiple sclerosis: Secondary | ICD-10-CM | POA: Diagnosis not present

## 2012-11-04 ENCOUNTER — Other Ambulatory Visit: Payer: Self-pay | Admitting: Family Medicine

## 2012-11-08 ENCOUNTER — Encounter: Payer: Self-pay | Admitting: *Deleted

## 2012-11-10 ENCOUNTER — Other Ambulatory Visit: Payer: Self-pay | Admitting: Family Medicine

## 2012-11-20 DIAGNOSIS — Z96659 Presence of unspecified artificial knee joint: Secondary | ICD-10-CM | POA: Diagnosis not present

## 2012-11-20 DIAGNOSIS — M25569 Pain in unspecified knee: Secondary | ICD-10-CM | POA: Diagnosis not present

## 2012-11-20 DIAGNOSIS — Z471 Aftercare following joint replacement surgery: Secondary | ICD-10-CM | POA: Diagnosis not present

## 2012-11-26 NOTE — Telephone Encounter (Signed)
This encounter was created in error - please disregard.

## 2012-11-29 DIAGNOSIS — IMO0002 Reserved for concepts with insufficient information to code with codable children: Secondary | ICD-10-CM | POA: Diagnosis not present

## 2012-11-29 DIAGNOSIS — R3915 Urgency of urination: Secondary | ICD-10-CM | POA: Diagnosis not present

## 2012-11-29 DIAGNOSIS — G35 Multiple sclerosis: Secondary | ICD-10-CM | POA: Diagnosis not present

## 2012-11-29 DIAGNOSIS — Z9889 Other specified postprocedural states: Secondary | ICD-10-CM | POA: Diagnosis not present

## 2012-11-29 DIAGNOSIS — C801 Malignant (primary) neoplasm, unspecified: Secondary | ICD-10-CM | POA: Diagnosis not present

## 2012-11-29 DIAGNOSIS — N3941 Urge incontinence: Secondary | ICD-10-CM | POA: Diagnosis not present

## 2012-11-29 DIAGNOSIS — N318 Other neuromuscular dysfunction of bladder: Secondary | ICD-10-CM | POA: Diagnosis not present

## 2012-11-29 DIAGNOSIS — N319 Neuromuscular dysfunction of bladder, unspecified: Secondary | ICD-10-CM | POA: Diagnosis not present

## 2012-12-01 ENCOUNTER — Ambulatory Visit: Payer: Medicare Other | Admitting: Family Medicine

## 2012-12-06 ENCOUNTER — Ambulatory Visit (INDEPENDENT_AMBULATORY_CARE_PROVIDER_SITE_OTHER): Payer: Medicare Other | Admitting: Family Medicine

## 2012-12-06 ENCOUNTER — Encounter: Payer: Self-pay | Admitting: Family Medicine

## 2012-12-06 ENCOUNTER — Emergency Department (HOSPITAL_COMMUNITY)
Admission: EM | Admit: 2012-12-06 | Discharge: 2012-12-07 | Disposition: A | Discharge status: Home / Self Care | Payer: Medicare Other | Attending: Emergency Medicine | Admitting: Emergency Medicine

## 2012-12-06 ENCOUNTER — Encounter (HOSPITAL_COMMUNITY): Payer: Self-pay | Admitting: Emergency Medicine

## 2012-12-06 ENCOUNTER — Emergency Department (HOSPITAL_COMMUNITY): Payer: Medicare Other

## 2012-12-06 VITALS — BP 150/88 | HR 74 | Temp 97.0°F | Resp 18 | Ht 71.0 in | Wt 230.0 lb

## 2012-12-06 DIAGNOSIS — F3289 Other specified depressive episodes: Secondary | ICD-10-CM | POA: Insufficient documentation

## 2012-12-06 DIAGNOSIS — R0989 Other specified symptoms and signs involving the circulatory and respiratory systems: Secondary | ICD-10-CM | POA: Insufficient documentation

## 2012-12-06 DIAGNOSIS — Z7982 Long term (current) use of aspirin: Secondary | ICD-10-CM | POA: Diagnosis not present

## 2012-12-06 DIAGNOSIS — I509 Heart failure, unspecified: Secondary | ICD-10-CM

## 2012-12-06 DIAGNOSIS — Z8739 Personal history of other diseases of the musculoskeletal system and connective tissue: Secondary | ICD-10-CM | POA: Insufficient documentation

## 2012-12-06 DIAGNOSIS — R0609 Other forms of dyspnea: Secondary | ICD-10-CM | POA: Insufficient documentation

## 2012-12-06 DIAGNOSIS — J441 Chronic obstructive pulmonary disease with (acute) exacerbation: Secondary | ICD-10-CM | POA: Diagnosis not present

## 2012-12-06 DIAGNOSIS — Z87448 Personal history of other diseases of urinary system: Secondary | ICD-10-CM | POA: Insufficient documentation

## 2012-12-06 DIAGNOSIS — K219 Gastro-esophageal reflux disease without esophagitis: Secondary | ICD-10-CM | POA: Diagnosis not present

## 2012-12-06 DIAGNOSIS — Z87891 Personal history of nicotine dependence: Secondary | ICD-10-CM | POA: Diagnosis not present

## 2012-12-06 DIAGNOSIS — Z79899 Other long term (current) drug therapy: Secondary | ICD-10-CM | POA: Insufficient documentation

## 2012-12-06 DIAGNOSIS — R0602 Shortness of breath: Secondary | ICD-10-CM

## 2012-12-06 DIAGNOSIS — R918 Other nonspecific abnormal finding of lung field: Secondary | ICD-10-CM | POA: Diagnosis not present

## 2012-12-06 DIAGNOSIS — F329 Major depressive disorder, single episode, unspecified: Secondary | ICD-10-CM | POA: Insufficient documentation

## 2012-12-06 DIAGNOSIS — M7989 Other specified soft tissue disorders: Secondary | ICD-10-CM | POA: Diagnosis not present

## 2012-12-06 DIAGNOSIS — R0600 Dyspnea, unspecified: Secondary | ICD-10-CM

## 2012-12-06 LAB — CBC
Hemoglobin: 11.6 g/dL — ABNORMAL LOW (ref 13.0–17.0)
MCH: 28.6 pg (ref 26.0–34.0)
MCHC: 31.7 g/dL (ref 30.0–36.0)
MCV: 90.4 fL (ref 78.0–100.0)
Platelets: 323 10*3/uL (ref 150–400)
RBC: 4.05 MIL/uL — ABNORMAL LOW (ref 4.22–5.81)

## 2012-12-06 LAB — BASIC METABOLIC PANEL
BUN: 14 mg/dL (ref 6–23)
CO2: 27 mEq/L (ref 19–32)
Calcium: 8.5 mg/dL (ref 8.4–10.5)
GFR calc non Af Amer: 73 mL/min — ABNORMAL LOW (ref >90)
Potassium: 4.5 mEq/L (ref 3.5–5.1)
Sodium: 137 mEq/L (ref 135–145)

## 2012-12-06 LAB — PRO B NATRIURETIC PEPTIDE: Pro B Natriuretic peptide (BNP): 380.4 pg/mL — ABNORMAL HIGH (ref 0–125)

## 2012-12-06 LAB — POCT I-STAT TROPONIN I: Troponin i, poc: 0.02 ng/mL (ref 0.00–0.08)

## 2012-12-06 LAB — URINALYSIS, ROUTINE W REFLEX MICROSCOPIC
Bilirubin Urine: NEGATIVE
Glucose, UA: NEGATIVE mg/dL
Hgb urine dipstick: NEGATIVE
Protein, ur: NEGATIVE mg/dL
Urobilinogen, UA: 0.2 mg/dL (ref 0.0–1.0)

## 2012-12-06 MED ORDER — ALBUTEROL SULFATE HFA 108 (90 BASE) MCG/ACT IN AERS: 1.0000 | INHALATION_SPRAY | Freq: Four times a day (QID) | RESPIRATORY_TRACT | Status: DC | PRN

## 2012-12-06 MED ORDER — FUROSEMIDE 20 MG PO TABS
20.0000 mg | ORAL_TABLET | Freq: Once | ORAL | Status: AC
  Administered 2012-12-06: 20 mg via ORAL
  Filled 2012-12-06: qty 1

## 2012-12-06 MED ORDER — FUROSEMIDE 20 MG PO TABS: 20.0000 mg | ORAL_TABLET | Freq: Every day | ORAL | Status: DC

## 2012-12-06 MED ORDER — ALBUTEROL SULFATE HFA 108 (90 BASE) MCG/ACT IN AERS
2.0000 | INHALATION_SPRAY | RESPIRATORY_TRACT | Status: DC | PRN
  Administered 2012-12-06: 2 via RESPIRATORY_TRACT
  Filled 2012-12-06: qty 6.7

## 2012-12-06 NOTE — Assessment & Plan Note (Signed)
History note some diastolic dysfunction however he has not had any cardiac workup in many years. I'm concerned about congestive heart failure today based on his examination 20 pound weight gain over the past 8 weeks and his worsening dyspnea. He was very reluctant to go to the hospital. He states he's been through a lot with his multiple sclerosis and that he has very poor quality of life. He was here today with his wife and by the end of the visit he agreed to go the hospital for treatment.  A chest x-ray, LABS , Diuretic I have alerted [facility] about his history and his presentation

## 2012-12-06 NOTE — ED Notes (Addendum)
History of MS-Pt has been off his medications for 2 weeks.  Family member reports he was tired of taking medications and has been sleeping 20 out of 24 hours per day for over 1 week.  C/o wheezing, sob, bilateral leg weakness, and swelling to face and bilateral lower extremities.  Pt denies pain.

## 2012-12-06 NOTE — Patient Instructions (Signed)
Go to ER - 20lb fluid gain , concern for CHF

## 2012-12-06 NOTE — Progress Notes (Signed)
   Subjective:    Patient ID: [name] [name], male    DOB: [birth year], 72 y.o.   MRN: [id number]  HPI   Patient presents with worsening shortness of breath and leg swelling over the past 3-4 weeks. He is actually gaining 20 pounds in the past 8 weeks. He has a history of  Brugada syndrome as well as a bundle branch block but no history of congestive heart failure. He has a history of progressive multiple sclerosis which is being treated by [facility]. He's not had any recent medication changes. He does not have any orthopnea. He is urinating at his baseline. Of note he does have a stricture in his bladder therefore he does self catheterize once a week. He is a history of COPD and has had some mild wheezing this week but feels that his breathing is different from his typical exacerbations.  His wife notes that he is also sleeping most of the day which is been progressively worsening of the past couple months. He has severe fatigue and he is unable to do his regular activities.     Review of Systems - per above   GEN- + fatigue, fever, weight loss,weakness, recent illness HEENT- denies eye drainage, change in vision, nasal discharge, CVS- denies chest pain, palpitations RESP- +SOB, cough, wheeze MSK- denies joint pain, muscle aches, injury Neuro- denies headache, dizziness, syncope, seizure activity      Objective:   Physical Exam  GEN- NAD, alert and oriented x3, fatigued appearing HEENT- PERRL, EOMI, non injected sclera, pink conjunctiva, MMM, oropharynx clear Neck- Supple,+JVD CVS- RRR, 2/6 SEM Left sternal base RESP-Decreased BS bilat, few crackles Left side, no wheeze ABD-NABS,soft, NT, distended appearing  EXT- 1+  edema Pulses- Radial 2+, DP- 1+        Assessment & Plan:

## 2012-12-06 NOTE — Assessment & Plan Note (Signed)
Based on his progressive shortness of breath or weight gain and concern for congestive heart failure

## 2012-12-06 NOTE — ED Provider Notes (Signed)
CSN: [id number]     Arrival date & time 12/06/12  1713 History   First MD Initiated Contact with Patient 12/06/12 1956     Chief Complaint  Patient presents with  . Facial Swelling  . Leg Swelling  . Weakness   (Consider location/radiation/quality/duration/timing/severity/associated sxs/prior Treatment) Patient is a 72 y.o. male presenting with weakness. The history is provided by the patient.  Weakness This is a chronic problem. Associated symptoms include shortness of breath. Pertinent negatives include no chest pain, no abdominal pain and no headaches.   patient has multiple sclerosis. He states that over the last several months she is been more fatigued. He sleeps 20 hours a day. He does not get out much. His been having increased wheezing with walking. His been worse over last 4 days. No fevers. No cough. No dysuria. Abdominal pain. He has had an increase of his weight over the last week or 2. He is reportedly put on 20 pounds. He does have a history of heart failure. No chest pain.  Past Medical History  Diagnosis Date  . MS (multiple sclerosis)   . Arthritis   . Diastolic dysfunction   . Right bundle branch block   . Brugada syndrome   . COPD (chronic obstructive pulmonary disease)   . GERD (gastroesophageal reflux disease)   . Depression     with bipolar tendencies  . Urinary retention with incomplete bladder emptying     receives botox injections and treatment for BPH through [facility]  . BPH (benign prostatic hyperplasia)    Past Surgical History  Procedure Laterality Date  . Total knee arthroplasty    . Shoulder surgery    . Prostate surgery      TURP   No family history on file. History  Substance Use Topics  . Smoking status: Former Games developer  . Smokeless tobacco: Not on file  . Alcohol Use: No    Review of Systems  Constitutional: Negative for activity change and appetite change.  Eyes: Negative for pain.  Respiratory: Positive for shortness of breath and  wheezing. Negative for chest tightness.   Cardiovascular: Positive for leg swelling. Negative for chest pain.  Gastrointestinal: Negative for nausea, vomiting, abdominal pain and diarrhea.  Genitourinary: Negative for flank pain.  Musculoskeletal: Negative for back pain and neck stiffness.  Skin: Negative for rash.  Neurological: Positive for weakness. Negative for numbness and headaches.  Psychiatric/Behavioral: Negative for behavioral problems.    Allergies  Acetylcholine; Alcohol-sulfur; Amitriptyline; Bupivacaine; Clomipramine hcl; Cocaine; Desipramine; Ergonovine; Flecainide; Lithium; Loxapine; Nortriptyline; Oxcarbazepine; Procainamide; Procaine; Propafenone; Propofol; and Trifluoperazine  Home Medications   Current Outpatient Rx  Name  Route  Sig  Dispense  Refill  . aspirin EC 81 MG tablet   Oral   Take 81 mg by mouth daily.          . bisoprolol (ZEBETA) 5 MG tablet   Oral   Take 2.5 mg by mouth daily.         . diazepam (VALIUM) 5 MG tablet   Oral   Take 5 mg by mouth every 12 (twelve) hours as needed. For anxiety         . divalproex (DEPAKOTE ER) 500 MG 24 hr tablet   Oral   Take 1 tablet (500 mg total) by mouth at bedtime.   90 tablet   1   . donepezil (ARICEPT) 5 MG tablet      TAKE 2 TABLETS BY MOUTH AT BEDTIME   60 tablet  1   . Hydrocodone-Acetaminophen 10-660 MG TABS      1 tab po q 4-6 hours prn pain   100 each   0   . modafinil (PROVIGIL) 100 MG tablet   Oral   Take 2 tablets (200 mg total) by mouth daily.   180 tablet   3   . ofloxacin (FLOXIN) 0.3 % otic solution   Right Ear   Place 5 drops into the right ear 2 (two) times daily.   5 mL   0   . omeprazole (PRILOSEC) 20 MG capsule   Oral   Take 20 mg by mouth daily.           Marland Kitchen rOPINIRole (REQUIP) 1 MG tablet      TAKE 1 TABLET BY MOUTH AT BEDTIME   90 tablet   1   . testosterone cypionate (DEPOTESTOTERONE CYPIONATE) 200 MG/ML injection   Intramuscular   Inject 1 mL  (200 mg total) into the muscle every 14 (fourteen) days.   10 mL   0   . tiotropium (SPIRIVA) 18 MCG inhalation capsule   Inhalation   Place 18 mcg into inhaler and inhale daily.         Marland Kitchen venlafaxine XR (EFFEXOR-XR) 150 MG 24 hr capsule      TAKE 1 CAPSULE TWICE A DAY   180 capsule   2   . albuterol (PROVENTIL HFA;VENTOLIN HFA) 108 (90 BASE) MCG/ACT inhaler   Inhalation   Inhale 1-2 puffs into the lungs every 6 (six) hours as needed for wheezing or shortness of breath.   1 Inhaler   0   . furosemide (LASIX) 20 MG tablet   Oral   Take 1 tablet (20 mg total) by mouth daily.   7 tablet   0    BP 172/72  Pulse 86  Temp(Src) 98.3 F (36.8 C) (Oral)  Resp 20  Wt 233 lb 4.8 oz (105.824 kg)  SpO2 100% Physical Exam  Nursing note and vitals reviewed. Constitutional: He is oriented to person, place, and time. He appears well-developed and well-nourished.  HENT:  Head: Normocephalic and atraumatic.  Eyes: EOM are normal. Pupils are equal, round, and reactive to light.  Neck: Normal range of motion. Neck supple.  Cardiovascular: Normal rate, regular rhythm and normal heart sounds.   No murmur heard. Pulmonary/Chest: Effort normal and breath sounds normal.  Abdominal: Soft. Bowel sounds are normal. He exhibits no distension and no mass. There is no tenderness. There is no rebound and no guarding.  Musculoskeletal: Normal range of motion. He exhibits edema.  Bilateral lower extremity pitting edema.  Neurological: He is alert and oriented to person, place, and time. No cranial nerve deficit.  Prescriptions bilaterally. Mild chronic weakness.  Skin: Skin is warm and dry.  Psychiatric: He has a normal mood and affect.  Patient is awake and pleasant.    ED Course  Procedures (including critical care time) Labs Review Labs Reviewed  CBC - Abnormal; Notable for the following:    RBC 4.05 (*)    Hemoglobin 11.6 (*)    HCT 36.6 (*)    All other components within normal  limits  BASIC METABOLIC PANEL - Abnormal; Notable for the following:    GFR calc non Af Amer 73 (*)    GFR calc Af Amer 84 (*)    All other components within normal limits  PRO B NATRIURETIC PEPTIDE - Abnormal; Notable for the following:    Pro B Natriuretic peptide (  BNP) 380.4 (*)    All other components within normal limits  URINALYSIS, ROUTINE W REFLEX MICROSCOPIC  POCT I-STAT TROPONIN I   Imaging Review Dg Chest 2 View  12/06/2012   CLINICAL DATA:  Facial swelling and leg swelling.  EXAM: CHEST  2 VIEW  COMPARISON:  01/24/2009  FINDINGS: The lungs are adequately inflated with subtle opacification in the right base which may be due to atelectasis although cannot exclude early infection. No evidence of effusion. Cardiomediastinal silhouette and remainder of the exam is unchanged.  IMPRESSION: Subtle right base opacification which may be chronic or due to atelectasis, although cannot exclude early infection.   Electronically Signed   By: Elberta Fortis M.D.   On: 12/06/2012 18:37    EKG Interpretation    Date/Time:  Wednesday December 06 2012 17:18:56 EST Ventricular Rate:  76 PR Interval:  184 QRS Duration: 106 QT Interval:  372 QTC Calculation: 418 R Axis:   -37 Text Interpretation:  Normal sinus rhythm Left axis deviation Incomplete right bundle branch block Cannot rule out Anterior infarct , age undetermined Abnormal ECG No significant change since last tracing Confirmed by Cesily Cuoco  MD, Meika Earll ([id number]) on 12/06/2012 9:01:31 PM            MDM   1. CHF (congestive heart failure)   2. Dyspnea    Patient with some shortness of breath. Reassuring exam. No rales. Patient has put on weight and BNP is mildly elevated. We'll treat with some Lasix and albuterol and will followup with PCP and cardiology.    Juliet Rude. Rubin Payor, MD 12/06/12 [phone]

## 2012-12-06 NOTE — ED Notes (Signed)
Pt with expiratory wheezing upon standing up to use urinal.  EDP made aware.

## 2012-12-07 ENCOUNTER — Ambulatory Visit (INDEPENDENT_AMBULATORY_CARE_PROVIDER_SITE_OTHER): Payer: Medicare Other | Admitting: Family Medicine

## 2012-12-07 ENCOUNTER — Encounter: Payer: Self-pay | Admitting: Family Medicine

## 2012-12-07 VITALS — BP 142/80 | HR 68 | Temp 97.8°F | Resp 20 | Wt 233.0 lb

## 2012-12-07 DIAGNOSIS — R69 Illness, unspecified: Secondary | ICD-10-CM

## 2012-12-07 DIAGNOSIS — R635 Abnormal weight gain: Secondary | ICD-10-CM | POA: Diagnosis not present

## 2012-12-07 DIAGNOSIS — R0609 Other forms of dyspnea: Secondary | ICD-10-CM | POA: Diagnosis not present

## 2012-12-07 DIAGNOSIS — R609 Edema, unspecified: Secondary | ICD-10-CM

## 2012-12-07 DIAGNOSIS — R6889 Other general symptoms and signs: Secondary | ICD-10-CM

## 2012-12-07 MED ORDER — HYDROCODONE-ACETAMINOPHEN 10-660 MG PO TABS: ORAL_TABLET | ORAL | Status: DC

## 2012-12-08 ENCOUNTER — Other Ambulatory Visit: Payer: Self-pay | Admitting: Family Medicine

## 2012-12-08 ENCOUNTER — Encounter: Payer: Self-pay | Admitting: Family Medicine

## 2012-12-08 ENCOUNTER — Telehealth: Payer: Self-pay | Admitting: Family Medicine

## 2012-12-08 LAB — URINALYSIS, ROUTINE W REFLEX MICROSCOPIC
Bilirubin Urine: NEGATIVE
Glucose, UA: NEGATIVE mg/dL
Hgb urine dipstick: NEGATIVE
Ketones, ur: NEGATIVE mg/dL
Leukocytes, UA: NEGATIVE
Protein, ur: NEGATIVE mg/dL
Specific Gravity, Urine: 1.016 (ref 1.005–1.030)
pH: 6 (ref 5.0–8.0)

## 2012-12-08 MED ORDER — HYDROCODONE-ACETAMINOPHEN 10-325 MG PO TABS: 1.0000 | ORAL_TABLET | Freq: Three times a day (TID) | ORAL | Status: DC | PRN

## 2012-12-08 NOTE — Telephone Encounter (Signed)
Med changed RX printed, left up front and patient aware to pick up

## 2012-12-08 NOTE — Telephone Encounter (Signed)
[facility] won't refill hydrocodone because they have that strength and she is wondering if she can get another prescription and pick it up Monday when she brings in [name] urine Call back number is [phone]

## 2012-12-08 NOTE — Progress Notes (Signed)
Subjective:    Patient ID: [name] [name], male    DOB: 12-06-1940, 72 y.o.   MRN: [id number]  HPI Patient is here today for followup.  I reviewed yesterday's office visit as well as his emergency room records. He was seen in the clinic complaining of 20 pounds of weight gain, 2+ edema in both lower trinities, extreme fatigue, and dyspnea on exertion.  He was referred to the emergency room for a BNP was slightly elevated at 300. Chest x-ray did not reveal any pulmonary edema. He was given one dose of IV Lasix and discharged home. His weight has not changed. He has not diuresed. He continues to have 2+ pitting edema in both legs. Upon further discussion the patient has gained at least 15-20 pounds since I saw him in October. The only medication change made with the addition of intramuscular testosterone for testosterone deficiency. Wall that hasn't helped with his fatigue slightly he continues to complain of dyspnea on exertion, abnormal weight gain, and worsening edema. He has a cushingoid facies and a slight "buffalo hump" in the fat pad on cervical spine.  He denies any use of prednisone although he is taking the medication in the past. His physical exam findings are concerning for possible Cushing syndrome.  He has not had an MRI of the brain in in a year. He has not had a CT scan of the abdomen or pelvis in several years.  He does have a history of COPD and grade 1 diastolic dysfunction discovered on echocardiogram in 2011. Past Medical History  Diagnosis Date  . MS (multiple sclerosis)   . Arthritis   . Diastolic dysfunction   . Right bundle branch block   . Brugada syndrome   . COPD (chronic obstructive pulmonary disease)   . GERD (gastroesophageal reflux disease)   . Depression     with bipolar tendencies  . Urinary retention with incomplete bladder emptying     receives botox injections and treatment for BPH through [facility]  . BPH (benign prostatic hyperplasia)    Past Surgical History   Procedure Laterality Date  . Total knee arthroplasty    . Shoulder surgery    . Prostate surgery      TURP   Current Outpatient Prescriptions on File Prior to Visit  Medication Sig Dispense Refill  . albuterol (PROVENTIL HFA;VENTOLIN HFA) 108 (90 BASE) MCG/ACT inhaler Inhale 1-2 puffs into the lungs every 6 (six) hours as needed for wheezing or shortness of breath.  1 Inhaler  0  . aspirin EC 81 MG tablet Take 81 mg by mouth daily.       . bisoprolol (ZEBETA) 5 MG tablet Take 2.5 mg by mouth daily.      . diazepam (VALIUM) 5 MG tablet Take 5 mg by mouth every 12 (twelve) hours as needed. For anxiety      . divalproex (DEPAKOTE ER) 500 MG 24 hr tablet Take 1 tablet (500 mg total) by mouth at bedtime.  90 tablet  1  . donepezil (ARICEPT) 5 MG tablet TAKE 2 TABLETS BY MOUTH AT BEDTIME  60 tablet  1  . furosemide (LASIX) 20 MG tablet Take 1 tablet (20 mg total) by mouth daily.  7 tablet  0  . modafinil (PROVIGIL) 100 MG tablet Take 2 tablets (200 mg total) by mouth daily.  180 tablet  3  . ofloxacin (FLOXIN) 0.3 % otic solution Place 5 drops into the right ear 2 (two) times daily.  5 mL  0  . omeprazole (PRILOSEC) 20 MG capsule Take 20 mg by mouth daily.        Marland Kitchen rOPINIRole (REQUIP) 1 MG tablet TAKE 1 TABLET BY MOUTH AT BEDTIME  90 tablet  1  . testosterone cypionate (DEPOTESTOTERONE CYPIONATE) 200 MG/ML injection Inject 1 mL (200 mg total) into the muscle every 14 (fourteen) days.  10 mL  0  . tiotropium (SPIRIVA) 18 MCG inhalation capsule Place 18 mcg into inhaler and inhale daily.      Marland Kitchen venlafaxine XR (EFFEXOR-XR) 150 MG 24 hr capsule TAKE 1 CAPSULE TWICE A DAY  180 capsule  2   No current facility-administered medications on file prior to visit.   Allergies  Allergen Reactions  . Acetylcholine     unknown  . Alcohol-Sulfur [Sulfur]     unknown  . Amitriptyline     unknown  . Bupivacaine     unknown  . Clomipramine Hcl     unknown  . Cocaine     unknown  . Desipramine      unknown  . Ergonovine     unknown  . Flecainide     unknown  . Lithium     unknown  . Loxapine     unknown  . Nortriptyline     unknown  . Oxcarbazepine     unknown  . Procainamide     unknown  . Procaine     unknwon  . Propafenone     unknown  . Propofol     unknown  . Trifluoperazine     unknown   History   Social History  . Marital Status: Married    Spouse Name: N/A    Number of Children: N/A  . Years of Education: N/A   Occupational History  . Not on file.   Social History Main Topics  . Smoking status: Former Games developer  . Smokeless tobacco: Not on file  . Alcohol Use: No  . Drug Use: Not on file  . Sexual Activity: Not on file   Other Topics Concern  . Not on file   Social History Narrative  . No narrative on file      Review of Systems  All other systems reviewed and are negative.       Objective:   Physical Exam  Vitals reviewed. Neck: Neck supple. No JVD present. No thyromegaly present.  Cardiovascular: Normal rate, regular rhythm and intact distal pulses.  Exam reveals no gallop and no friction rub.   No murmur heard. Pulmonary/Chest: Effort normal and breath sounds normal. No respiratory distress. He has no wheezes. He has no rales. He exhibits no tenderness.  Abdominal: Soft. Bowel sounds are normal. He exhibits no distension and no mass. There is no tenderness. There is no rebound and no guarding.  Musculoskeletal: He exhibits edema.  Lymphadenopathy:    He has no cervical adenopathy.   patient has 2+ pitting edema in both legs to the level of his knee.  He has a cushingoid appearance to his face as well as a slight buffalo hump on his posterior cervical spine        Assessment & Plan:  Edema - Plan: Urinalysis, Routine w reflex microscopic  Abnormal weight gain  Dyspnea on exertion  Cushingoid facies  Differential diagnosis includes hypothyroidism, congestive heart failure, Cushing syndrome, nephrotic syndrome. I will  obtain a urinalysis to rule out nephrotic syndrome. I will obtain a 24-hour urine cortisol. If the cortisol level is elevated check a  serum ACTH level and initiate a workup for Cushing's syndrome. I also obtained a 2-D echocardiogram ASAP to rule out congestive heart failure. After I obtain the 24-hour urine cortisol, begin the patient on higher dose Lasix diuresis. Also consider that testosterone may be causing weight gain although I feel the edema and weight gain is out of portion to what one would expect after the initiation of testosterone.

## 2012-12-11 ENCOUNTER — Other Ambulatory Visit: Payer: Medicare Other

## 2012-12-11 ENCOUNTER — Encounter: Payer: Self-pay | Admitting: *Deleted

## 2012-12-11 DIAGNOSIS — R5381 Other malaise: Secondary | ICD-10-CM

## 2012-12-11 DIAGNOSIS — R635 Abnormal weight gain: Secondary | ICD-10-CM | POA: Diagnosis not present

## 2012-12-11 DIAGNOSIS — R6889 Other general symptoms and signs: Secondary | ICD-10-CM

## 2012-12-11 DIAGNOSIS — R609 Edema, unspecified: Secondary | ICD-10-CM

## 2012-12-11 DIAGNOSIS — R69 Illness, unspecified: Secondary | ICD-10-CM | POA: Diagnosis not present

## 2012-12-12 ENCOUNTER — Other Ambulatory Visit: Payer: Self-pay | Admitting: Family Medicine

## 2012-12-12 MED ORDER — FUROSEMIDE 40 MG PO TABS: 40.0000 mg | ORAL_TABLET | Freq: Every day | ORAL | Status: DC

## 2012-12-12 NOTE — Telephone Encounter (Signed)
Pt is urinating very little and would like to know what to do about it?   Per [facility] increase lasik to 40 mg and recheck in OV on Friday 12/15/12.    Med sent to pharm and pt aware and appt made

## 2012-12-14 ENCOUNTER — Other Ambulatory Visit: Payer: Self-pay | Admitting: Family Medicine

## 2012-12-15 ENCOUNTER — Ambulatory Visit (INDEPENDENT_AMBULATORY_CARE_PROVIDER_SITE_OTHER): Payer: Medicare Other | Admitting: Family Medicine

## 2012-12-15 ENCOUNTER — Encounter: Payer: Self-pay | Admitting: Family Medicine

## 2012-12-15 VITALS — BP 132/80 | HR 78 | Temp 98.6°F | Resp 16 | Ht 71.0 in | Wt 222.0 lb

## 2012-12-15 DIAGNOSIS — R635 Abnormal weight gain: Secondary | ICD-10-CM | POA: Diagnosis not present

## 2012-12-15 DIAGNOSIS — R609 Edema, unspecified: Secondary | ICD-10-CM

## 2012-12-15 DIAGNOSIS — R5381 Other malaise: Secondary | ICD-10-CM

## 2012-12-15 LAB — BASIC METABOLIC PANEL WITH GFR
BUN: 19 mg/dL (ref 6–23)
Chloride: 96 mEq/L (ref 96–112)
GFR, Est African American: 71 mL/min
GFR, Est Non African American: 62 mL/min
Glucose, Bld: 81 mg/dL (ref 70–99)
Potassium: 5 mEq/L (ref 3.5–5.3)
Sodium: 136 mEq/L (ref 135–145)

## 2012-12-15 NOTE — Progress Notes (Signed)
Subjective:    Patient ID: [name] [name], male    DOB: 1940/07/25, 72 y.o.   MRN: [id number]  HPI 12/07/12 Patient is here today for followup.  I reviewed yesterday's office visit as well as his emergency room records. He was seen in the clinic complaining of 20 pounds of weight gain, 2+ edema in both lower trinities, extreme fatigue, and dyspnea on exertion.  He was referred to the emergency room for a BNP was slightly elevated at 300. Chest x-ray did not reveal any pulmonary edema. He was given one dose of IV Lasix and discharged home. His weight has not changed. He has not diuresed. He continues to have 2+ pitting edema in both legs. Upon further discussion the patient has gained at least 15-20 pounds since I saw him in October. The only medication change made with the addition of intramuscular testosterone for testosterone deficiency. Wall that hasn't helped with his fatigue slightly he continues to complain of dyspnea on exertion, abnormal weight gain, and worsening edema. He has a cushingoid facies and a slight "buffalo hump" in the fat pad on cervical spine.  He denies any use of prednisone although he is taking the medication in the past. His physical exam findings are concerning for possible Cushing syndrome.  He has not had an MRI of the brain in in a year. He has not had a CT scan of the abdomen or pelvis in several years.  He does have a history of COPD and grade 1 diastolic dysfunction discovered on echocardiogram in 2011.  At that time, my plan was: Edema - Plan: BASIC METABOLIC PANEL WITH GFR  Weight gain  Other malaise and fatigue  Differential diagnosis includes hypothyroidism, congestive heart failure, Cushing syndrome, nephrotic syndrome. I will obtain a urinalysis to rule out nephrotic syndrome. I will obtain a 24-hour urine cortisol. If the cortisol level is elevated check a serum ACTH level and initiate a workup for Cushing's syndrome. I also obtained a 2-D echocardiogram ASAP  to rule out congestive heart failure. After I obtain the 24-hour urine cortisol, begin the patient on higher dose Lasix diuresis. Also consider that testosterone may be causing weight gain although I feel the edema and weight gain is out of portion to what one would expect after the initiation of testosterone.  The results have not yet come back on his 24-hour urine cortisol. He is scheduled to receive his echocardiogram on Monday. However in the meantime he's been taking two 40 mg Lasix every day.  He has lost approximately 11 pounds in 8 days.  He states he feels much better. His shortness of breath is much improved. His fatigue is much improved. He is very happy with his energy on the testosterone.  Overall he is doing much better since I saw him just a week ago.  Past Medical History  Diagnosis Date  . MS (multiple sclerosis)   . Arthritis   . Diastolic dysfunction   . Right bundle branch block   . Brugada syndrome   . COPD (chronic obstructive pulmonary disease)   . GERD (gastroesophageal reflux disease)   . Depression     with bipolar tendencies  . Urinary retention with incomplete bladder emptying     receives botox injections and treatment for BPH through [facility]  . BPH (benign prostatic hyperplasia)    Past Surgical History  Procedure Laterality Date  . Total knee arthroplasty    . Shoulder surgery    . Prostate surgery  TURP   Current Outpatient Prescriptions on File Prior to Visit  Medication Sig Dispense Refill  . albuterol (PROVENTIL HFA;VENTOLIN HFA) 108 (90 BASE) MCG/ACT inhaler Inhale 1-2 puffs into the lungs every 6 (six) hours as needed for wheezing or shortness of breath.  1 Inhaler  0  . aspirin EC 81 MG tablet Take 81 mg by mouth daily.       . bisoprolol (ZEBETA) 5 MG tablet Take 2.5 mg by mouth daily.      . diazepam (VALIUM) 5 MG tablet Take 5 mg by mouth every 12 (twelve) hours as needed. For anxiety      . divalproex (DEPAKOTE ER) 500 MG 24 hr tablet Take 1  tablet (500 mg total) by mouth at bedtime.  90 tablet  1  . donepezil (ARICEPT) 5 MG tablet TAKE 2 TABLETS BY MOUTH AT BEDTIME  60 tablet  1  . furosemide (LASIX) 40 MG tablet Take 1 tablet (40 mg total) by mouth daily.  30 tablet  3  . HYDROcodone-acetaminophen (NORCO) 10-325 MG per tablet Take 1 tablet by mouth every 8 (eight) hours as needed.  100 tablet  0  . Hydrocodone-Acetaminophen 10-660 MG TABS 1 tab po q 4-6 hours prn pain  100 each  0  . losartan (COZAAR) 50 MG tablet TAKE 1 TABLET BY MOUTH EVERY DAY  90 tablet  2  . ofloxacin (FLOXIN) 0.3 % otic solution Place 5 drops into the right ear 2 (two) times daily.  5 mL  0  . omeprazole (PRILOSEC) 20 MG capsule Take 20 mg by mouth daily.        Marland Kitchen rOPINIRole (REQUIP) 1 MG tablet TAKE 1 TABLET BY MOUTH AT BEDTIME  90 tablet  1  . SPIRIVA HANDIHALER 18 MCG inhalation capsule USE 2 INHALATIONS FROM 1 CAPSULE ONLY ONCE DAILY  1 capsule  2  . testosterone cypionate (DEPOTESTOTERONE CYPIONATE) 200 MG/ML injection Inject 1 mL (200 mg total) into the muscle every 14 (fourteen) days.  10 mL  0  . venlafaxine XR (EFFEXOR-XR) 150 MG 24 hr capsule TAKE 1 CAPSULE TWICE A DAY  180 capsule  2   No current facility-administered medications on file prior to visit.   Allergies  Allergen Reactions  . Acetylcholine     unknown  . Alcohol-Sulfur [Sulfur]     unknown  . Amitriptyline     unknown  . Bupivacaine     unknown  . Clomipramine Hcl     unknown  . Cocaine     unknown  . Desipramine     unknown  . Ergonovine     unknown  . Flecainide     unknown  . Lithium     unknown  . Loxapine     unknown  . Nortriptyline     unknown  . Oxcarbazepine     unknown  . Procainamide     unknown  . Procaine     unknwon  . Propafenone     unknown  . Propofol     unknown  . Trifluoperazine     unknown   History   Social History  . Marital Status: Married    Spouse Name: N/A    Number of Children: N/A  . Years of Education: N/A    Occupational History  . Not on file.   Social History Main Topics  . Smoking status: Former Games developer  . Smokeless tobacco: Not on file  . Alcohol Use: No  . Drug  Use: Not on file  . Sexual Activity: Not on file   Other Topics Concern  . Not on file   Social History Narrative  . No narrative on file      Review of Systems  All other systems reviewed and are negative.       Objective:   Physical Exam  Vitals reviewed. Neck: Neck supple. No JVD present. No thyromegaly present.  Cardiovascular: Normal rate, regular rhythm and intact distal pulses.  Exam reveals no gallop and no friction rub.   No murmur heard. Pulmonary/Chest: Effort normal and breath sounds normal. No respiratory distress. He has no wheezes. He has no rales. He exhibits no tenderness.  Abdominal: Soft. Bowel sounds are normal. He exhibits no distension and no mass. There is no tenderness. There is no rebound and no guarding.  Musculoskeletal: He exhibits edema.  Lymphadenopathy:    He has no cervical adenopathy.   patient has 1+ edema in the legs        Assessment & Plan:  Edema - Plan: BASIC METABOLIC PANEL WITH GFR  Weight gain  Other malaise and fatigue  Overall the patient is doing much better. Given high dose Lasix, check a BMP to rule out acute renal failure or hypokalemia. He still has about 10 pounds of fluid to lose to achieve his dry weight back in October. Once he is down to 214, I recommended he discontinue Lasix and use it only as needed. Overall he is feeling much better. I will see the patient back in January for a testosterone level, PSA.  I will await the results of echocardiogram and a 24-hour urine cortisol. However given the patient's response to Lasix I anticipate the cortisol result will be normal.

## 2012-12-18 LAB — CORTISOL, URINE, 24 HOUR: RESULTS RECEIVED: 1.92 g/(24.h) (ref 0.63–2.50)

## 2012-12-19 DIAGNOSIS — IMO0002 Reserved for concepts with insufficient information to code with codable children: Secondary | ICD-10-CM | POA: Diagnosis not present

## 2012-12-19 DIAGNOSIS — N401 Enlarged prostate with lower urinary tract symptoms: Secondary | ICD-10-CM | POA: Diagnosis not present

## 2012-12-19 DIAGNOSIS — C679 Malignant neoplasm of bladder, unspecified: Secondary | ICD-10-CM | POA: Diagnosis not present

## 2013-01-01 ENCOUNTER — Encounter: Payer: Self-pay | Admitting: Cardiology

## 2013-01-01 ENCOUNTER — Ambulatory Visit (HOSPITAL_COMMUNITY): Payer: Medicare Other | Attending: Cardiology | Admitting: Radiology

## 2013-01-01 DIAGNOSIS — I509 Heart failure, unspecified: Secondary | ICD-10-CM | POA: Diagnosis not present

## 2013-01-01 DIAGNOSIS — R635 Abnormal weight gain: Secondary | ICD-10-CM

## 2013-01-01 DIAGNOSIS — R079 Chest pain, unspecified: Secondary | ICD-10-CM | POA: Diagnosis not present

## 2013-01-01 DIAGNOSIS — R0602 Shortness of breath: Secondary | ICD-10-CM | POA: Diagnosis not present

## 2013-01-01 DIAGNOSIS — R609 Edema, unspecified: Secondary | ICD-10-CM | POA: Insufficient documentation

## 2013-01-01 DIAGNOSIS — J438 Other emphysema: Secondary | ICD-10-CM | POA: Diagnosis not present

## 2013-01-01 DIAGNOSIS — G35 Multiple sclerosis: Secondary | ICD-10-CM | POA: Insufficient documentation

## 2013-01-01 DIAGNOSIS — I059 Rheumatic mitral valve disease, unspecified: Secondary | ICD-10-CM | POA: Diagnosis not present

## 2013-01-01 DIAGNOSIS — I079 Rheumatic tricuspid valve disease, unspecified: Secondary | ICD-10-CM | POA: Diagnosis not present

## 2013-01-01 DIAGNOSIS — I4510 Unspecified right bundle-branch block: Secondary | ICD-10-CM | POA: Diagnosis not present

## 2013-01-01 DIAGNOSIS — I10 Essential (primary) hypertension: Secondary | ICD-10-CM | POA: Insufficient documentation

## 2013-01-01 NOTE — Progress Notes (Signed)
Echocardiogram performed.  

## 2013-01-02 ENCOUNTER — Other Ambulatory Visit: Payer: Self-pay | Admitting: Family Medicine

## 2013-01-02 ENCOUNTER — Encounter: Payer: Self-pay | Admitting: Family Medicine

## 2013-01-02 DIAGNOSIS — R931 Abnormal findings on diagnostic imaging of heart and coronary circulation: Secondary | ICD-10-CM

## 2013-01-02 NOTE — Telephone Encounter (Signed)
Pt wife is wanting to know if the [place] results are back  Call back number is [phone]

## 2013-01-03 ENCOUNTER — Ambulatory Visit (INDEPENDENT_AMBULATORY_CARE_PROVIDER_SITE_OTHER): Payer: Medicare Other | Admitting: Cardiology

## 2013-01-03 ENCOUNTER — Other Ambulatory Visit: Payer: Self-pay | Admitting: Cardiology

## 2013-01-03 ENCOUNTER — Encounter: Payer: Self-pay | Admitting: Cardiology

## 2013-01-03 VITALS — BP 146/82 | HR 72 | Ht 71.0 in | Wt 218.0 lb

## 2013-01-03 DIAGNOSIS — R079 Chest pain, unspecified: Secondary | ICD-10-CM | POA: Diagnosis not present

## 2013-01-03 DIAGNOSIS — I509 Heart failure, unspecified: Secondary | ICD-10-CM

## 2013-01-03 DIAGNOSIS — I428 Other cardiomyopathies: Secondary | ICD-10-CM

## 2013-01-03 DIAGNOSIS — R0602 Shortness of breath: Secondary | ICD-10-CM

## 2013-01-03 DIAGNOSIS — I429 Cardiomyopathy, unspecified: Secondary | ICD-10-CM

## 2013-01-03 DIAGNOSIS — I5032 Chronic diastolic (congestive) heart failure: Secondary | ICD-10-CM

## 2013-01-03 LAB — CBC WITH DIFFERENTIAL/PLATELET
Basophils Absolute: 0.1 10*3/uL (ref 0.0–0.1)
HCT: 40.1 % (ref 39.0–52.0)
Hemoglobin: 12.6 g/dL — ABNORMAL LOW (ref 13.0–17.0)
Lymphs Abs: 1.9 10*3/uL (ref 0.7–4.0)
Monocytes Absolute: 1 10*3/uL (ref 0.1–1.0)
Monocytes Relative: 11.4 % (ref 3.0–12.0)
Neutro Abs: 4.9 10*3/uL (ref 1.4–7.7)
RBC: 4.75 Mil/uL (ref 4.22–5.81)
RDW: 16.5 % — ABNORMAL HIGH (ref 11.5–14.6)

## 2013-01-03 LAB — BASIC METABOLIC PANEL
BUN: 19 mg/dL (ref 6–23)
CO2: 29 mEq/L (ref 19–32)
Calcium: 9.1 mg/dL (ref 8.4–10.5)
Chloride: 103 mEq/L (ref 96–112)
Creatinine, Ser: 1.2 mg/dL (ref 0.4–1.5)
GFR: 65.27 mL/min (ref >60.00)
Glucose, Bld: 103 mg/dL — ABNORMAL HIGH (ref 70–99)
Potassium: 5.2 mEq/L — ABNORMAL HIGH (ref 3.5–5.1)
Sodium: 138 mEq/L (ref 135–145)

## 2013-01-03 LAB — PROTIME-INR
INR: 1 ratio (ref 0.8–1.0)
Prothrombin Time: 10.7 s (ref 10.2–12.4)

## 2013-01-03 MED ORDER — FUROSEMIDE 40 MG PO TABS: 40.0000 mg | ORAL_TABLET | Freq: Two times a day (BID) | ORAL | Status: DC

## 2013-01-03 NOTE — Progress Notes (Signed)
Patient ID: [name] [name], male   DOB: 08/13/1940, 72 y.o.   MRN: [id number] PCP: Dr. Pickard  72 yo with history of HTN, hyperlipidemia, COPD and GERD presents for cardiology followup. He has not been seen here in over 3 years.  When I last saw him, he had a workup for exertional dyspnea.  Myoview did not show ischemia and echo showed normal EF.  He had evidence for moderate COPD on PFTs.   For the last 3 months, he has had significantly increased exertional dyspnea as well as lower leg edema.  He is short of breath if he walks fast or walks up a flight of steps.  No orthopnea or PND. He went to the ER earlier this month with dyspnea (and was discharged home).  BNP has been elevated. He was initially started on Lasix 40 mg bid.  This helped his breathing and his lower extremity edema.  Lasix was decreased to 40 mg daily, and the dyspnea is again worsening. He has exertional dyspnea but not exertional chest pain.  He will get occasional chest tightness at rest that tends to be brief and to have no particular trigger.  Patient had an echo done earlier this month which showed EF 45-50% with inferior and distal septal hypokinesis (different from prior echo).   ECG: NSR, LAFB, iRBBB   Labs (6/10): LDL 81, HDL 66, K 4.5, creatinine 1.04  Labs (4/11): BNP 75  Labs (12/14): BNP 380, K 5, creatinine 1.18, normal cortisol  Allergies (verified):  1) ! Codeine Sulfate (Codeine Sulfate)   Past Medical History:  1. Hypertension  2. Hypercholesterolemia  3. GERD  4. BPH  5. Osteoarthritis: s/p R TKR  6. Multiple sclerosis: Quiescent over last few years. Diagnosed at age 21. Sees neurology at [facility]. 7. Rotator cuff surgery 2/11  8. COPD: Prior heavy smoker. PFTs (3/11): FVC 87%, FEV1 73%, ratio 0.57, DLCO 75%, TLC 121%. Moderate obstructive defect.  9. Exertional dyspnea: Lexiscan myoview (8/09) with EF 57%, diaphragmatic attenuation but no ischemia or infarction. Lexiscan myoview (3/11): EF 68% with  fixed inferior defect likely due to diaphragmatic attenuation and no ischemia (similar to 2009). Echo (4/11): EF 55-60%, mild diastolic dysfunction, moderate mitral regurgitation, mild pulmonary HTN (PASP 38 mmHg), normal RV size and systolic function. PFTs (3/11) with moderate obstructive defect.  Echo (12/14) with EF 50-55%, inferior and apical septal hypokinesis, mild LVH, mild mitral regurgitation.  10. Mitral regurgitation. Echo (4/11) with PISA ERO 0.3 cm^2 and regurgitant volume 41 mL (moderate MR).  Echo (12/14) with mild MR.   11. Carotid dopplers (3/11): no significant stenosis.  12. Possible Type II Brugada ECG pattern. No family history of SCD, no syncope, no tachypalpitations. 13. Dementia: Mild.  14. Low testosterone 15. Bladder spasm   Family History:  Family History Hypertension  Father history of stroke. Mother history of stroke.  Sister with hypertension. Brother with kidney cancer, arthritis and bilateral knee replacements.  No premature CAD  No sudden cardiac death   Social History:  Married with children.  Lives in [place]  Tobacco Use - Former. started at age 15. 2 ppd. quit 1996.  Alcohol Use - no   Review of Systems  All systems reviewed and negative except as per HPI.   Current Outpatient Prescriptions  Medication Sig Dispense Refill  . albuterol (PROVENTIL HFA;VENTOLIN HFA) 108 (90 BASE) MCG/ACT inhaler Inhale 1-2 puffs into the lungs every 6 (six) hours as needed for wheezing or shortness of breath.  1   Inhaler  0  . aspirin EC 81 MG tablet Take 81 mg by mouth daily.       . bisoprolol (ZEBETA) 5 MG tablet Take 2.5 mg by mouth daily.      . diazepam (VALIUM) 5 MG tablet Take 5 mg by mouth every 12 (twelve) hours as needed. For anxiety      . divalproex (DEPAKOTE ER) 500 MG 24 hr tablet Take 1 tablet (500 mg total) by mouth at bedtime.  90 tablet  1  . donepezil (ARICEPT) 5 MG tablet TAKE 2 TABLETS BY MOUTH AT BEDTIME  60 tablet  1  . furosemide (LASIX)  40 MG tablet Take 1 tablet (40 mg total) by mouth 2 (two) times daily.  60 tablet  11  . HYDROcodone-acetaminophen (NORCO) 10-325 MG per tablet Take 1 tablet by mouth every 8 (eight) hours as needed.  100 tablet  0  . losartan (COZAAR) 50 MG tablet TAKE 1 TABLET BY MOUTH EVERY DAY  90 tablet  2  . ofloxacin (FLOXIN) 0.3 % otic solution Place 5 drops into the right ear 2 (two) times daily.  5 mL  0  . omeprazole (PRILOSEC) 20 MG capsule Take 20 mg by mouth daily.        . rOPINIRole (REQUIP) 1 MG tablet TAKE 1 TABLET BY MOUTH AT BEDTIME  90 tablet  1  . SPIRIVA HANDIHALER 18 MCG inhalation capsule USE 2 INHALATIONS FROM 1 CAPSULE ONLY ONCE DAILY  1 capsule  2  . testosterone cypionate (DEPOTESTOTERONE CYPIONATE) 200 MG/ML injection Inject 1 mL (200 mg total) into the muscle every 14 (fourteen) days.  10 mL  0  . venlafaxine XR (EFFEXOR-XR) 150 MG 24 hr capsule TAKE 1 CAPSULE TWICE A DAY  180 capsule  2   No current facility-administered medications for this visit.    BP 146/82  Pulse 72  Ht 5' 11" (1.803 m)  Wt 98.884 kg (218 lb)  BMI 30.42 kg/m2 General: NAD Neck: JVP 8 cm with HJR, no thyromegaly or thyroid nodule.  Lungs: Clear to auscultation bilaterally with normal respiratory effort. CV: Nondisplaced PMI.  Heart regular S1/S2, no S3/S4, no murmur.  1+ edema 1/2 up lower legs L>R.  No carotid bruit.  Normal pedal pulses.  Abdomen: Soft, nontender, no hepatosplenomegaly, no distention.  Skin: Intact without lesions or rashes.  Neurologic: Alert and oriented x 3.  Psych: Normal affect. Extremities: No clubbing or cyanosis.  HEENT: Normal.   Assessment/Plan: 1. Chronic primarily diastolic CHF: Echo with EF 45-50% and wall motion abnormalities elevated BNP, NYHA class II-III symptoms.  He is volume overloaded on exam today.   - Increase Lasix back to 40 mg po bid.   - BMET/BNP in 10 days.  2. Cardiomyopathy: Uncertain etiology.  He has inferior and apical septal wall motion  abnormalities.  Given the significant symptoms and the wall motion abnormalities on echo, I will do a left heart cath to assess for coronary disease as a contributor to his symptom complex.  He should continue ASA 81 mg daily.  If CAD is found, he will need a statin.  3. HTN: BP reasonable on current regimen.  4. COPD: He does not smoke now.  He is on Spiriva.   Lavender Stanke 01/03/2013    

## 2013-01-03 NOTE — Patient Instructions (Signed)
Increase Lasix to 40mg  twice daily  Labs today: Bmet, Cbc, Pt/Inr  Your physician recommends that you return for lab work in: 2 weeks  Your physician has requested that you have a cardiac catheterization. Cardiac catheterization is used to diagnose and/or treat various heart conditions. Doctors may recommend this procedure for a number of different reasons. The most common reason is to evaluate chest pain. Chest pain can be a symptom of coronary artery disease (CAD), and cardiac catheterization can show whether plaque is narrowing or blocking your heart's arteries. This procedure is also used to evaluate the valves, as well as measure the blood flow and oxygen levels in different parts of your heart. For further information please visit [email or web address] Please follow instruction sheet, as given.(01/05/13 @9am )  Your physician recommends that you schedule a follow-up appointment in: 1 month

## 2013-01-05 ENCOUNTER — Encounter (HOSPITAL_COMMUNITY): Payer: Self-pay | Admitting: Pharmacy Technician

## 2013-01-05 ENCOUNTER — Encounter (HOSPITAL_COMMUNITY)
Admission: RE | Disposition: A | Discharge status: Home / Self Care | Payer: Self-pay | Source: Ambulatory Visit | Attending: Cardiology

## 2013-01-05 ENCOUNTER — Ambulatory Visit (HOSPITAL_COMMUNITY)
Admission: RE | Admit: 2013-01-05 | Discharge: 2013-01-05 | Disposition: A | Discharge status: Home / Self Care | Payer: Medicare Other | Source: Ambulatory Visit | Attending: Cardiology | Admitting: Cardiology

## 2013-01-05 ENCOUNTER — Telehealth: Payer: Self-pay | Admitting: Family Medicine

## 2013-01-05 DIAGNOSIS — I5030 Unspecified diastolic (congestive) heart failure: Secondary | ICD-10-CM | POA: Insufficient documentation

## 2013-01-05 DIAGNOSIS — I10 Essential (primary) hypertension: Secondary | ICD-10-CM | POA: Diagnosis not present

## 2013-01-05 DIAGNOSIS — N400 Enlarged prostate without lower urinary tract symptoms: Secondary | ICD-10-CM | POA: Diagnosis not present

## 2013-01-05 DIAGNOSIS — I2510 Atherosclerotic heart disease of native coronary artery without angina pectoris: Secondary | ICD-10-CM | POA: Insufficient documentation

## 2013-01-05 DIAGNOSIS — K219 Gastro-esophageal reflux disease without esophagitis: Secondary | ICD-10-CM | POA: Insufficient documentation

## 2013-01-05 DIAGNOSIS — R609 Edema, unspecified: Secondary | ICD-10-CM | POA: Diagnosis not present

## 2013-01-05 DIAGNOSIS — G35 Multiple sclerosis: Secondary | ICD-10-CM | POA: Insufficient documentation

## 2013-01-05 DIAGNOSIS — E785 Hyperlipidemia, unspecified: Secondary | ICD-10-CM | POA: Insufficient documentation

## 2013-01-05 DIAGNOSIS — E7800 Pure hypercholesterolemia, unspecified: Secondary | ICD-10-CM | POA: Insufficient documentation

## 2013-01-05 DIAGNOSIS — R0989 Other specified symptoms and signs involving the circulatory and respiratory systems: Principal | ICD-10-CM | POA: Insufficient documentation

## 2013-01-05 DIAGNOSIS — F0390 Unspecified dementia without behavioral disturbance: Secondary | ICD-10-CM | POA: Insufficient documentation

## 2013-01-05 DIAGNOSIS — J4489 Other specified chronic obstructive pulmonary disease: Secondary | ICD-10-CM | POA: Insufficient documentation

## 2013-01-05 DIAGNOSIS — I509 Heart failure, unspecified: Secondary | ICD-10-CM | POA: Diagnosis not present

## 2013-01-05 DIAGNOSIS — Z96659 Presence of unspecified artificial knee joint: Secondary | ICD-10-CM | POA: Insufficient documentation

## 2013-01-05 DIAGNOSIS — I428 Other cardiomyopathies: Secondary | ICD-10-CM | POA: Diagnosis not present

## 2013-01-05 DIAGNOSIS — Z87891 Personal history of nicotine dependence: Secondary | ICD-10-CM | POA: Insufficient documentation

## 2013-01-05 DIAGNOSIS — Z7982 Long term (current) use of aspirin: Secondary | ICD-10-CM | POA: Insufficient documentation

## 2013-01-05 DIAGNOSIS — R0609 Other forms of dyspnea: Secondary | ICD-10-CM | POA: Diagnosis not present

## 2013-01-05 DIAGNOSIS — J449 Chronic obstructive pulmonary disease, unspecified: Secondary | ICD-10-CM | POA: Diagnosis not present

## 2013-01-05 DIAGNOSIS — I059 Rheumatic mitral valve disease, unspecified: Secondary | ICD-10-CM | POA: Insufficient documentation

## 2013-01-05 HISTORY — PX: LEFT HEART CATHETERIZATION WITH CORONARY ANGIOGRAM: SHX5451

## 2013-01-05 SURGERY — LEFT HEART CATHETERIZATION WITH CORONARY ANGIOGRAM
Anesthesia: LOCAL

## 2013-01-05 MED ORDER — HEPARIN (PORCINE) IN NACL 2-0.9 UNIT/ML-% IJ SOLN
INTRAMUSCULAR | Status: AC
  Filled 2013-01-05: qty 1000

## 2013-01-05 MED ORDER — SODIUM CHLORIDE 0.9 % IV SOLN: INTRAVENOUS | Status: AC

## 2013-01-05 MED ORDER — ONDANSETRON HCL 4 MG/2ML IJ SOLN: 4.0000 mg | Freq: Four times a day (QID) | INTRAMUSCULAR | Status: DC | PRN

## 2013-01-05 MED ORDER — ALBUTEROL SULFATE HFA 108 (90 BASE) MCG/ACT IN AERS: 1.0000 | INHALATION_SPRAY | Freq: Four times a day (QID) | RESPIRATORY_TRACT | Status: DC | PRN

## 2013-01-05 MED ORDER — NITROGLYCERIN 0.2 MG/ML ON CALL CATH LAB
INTRAVENOUS | Status: AC
  Filled 2013-01-05: qty 1

## 2013-01-05 MED ORDER — HEPARIN SODIUM (PORCINE) 1000 UNIT/ML IJ SOLN
INTRAMUSCULAR | Status: AC
  Filled 2013-01-05: qty 1

## 2013-01-05 MED ORDER — LIDOCAINE HCL (PF) 1 % IJ SOLN
INTRAMUSCULAR | Status: AC
  Filled 2013-01-05: qty 30

## 2013-01-05 MED ORDER — SODIUM CHLORIDE 0.9 % IV SOLN
250.0000 mL | INTRAVENOUS | Status: DC | PRN
  Administered 2013-01-05: 75 mL/h via INTRAVENOUS

## 2013-01-05 MED ORDER — SODIUM CHLORIDE 0.9 % IJ SOLN: 3.0000 mL | INTRAMUSCULAR | Status: DC | PRN

## 2013-01-05 MED ORDER — VERAPAMIL HCL 2.5 MG/ML IV SOLN
INTRAVENOUS | Status: AC
  Filled 2013-01-05: qty 2

## 2013-01-05 MED ORDER — FENTANYL CITRATE 0.05 MG/ML IJ SOLN
INTRAMUSCULAR | Status: AC
  Filled 2013-01-05: qty 2

## 2013-01-05 MED ORDER — ASPIRIN 81 MG PO CHEW: 81.0000 mg | CHEWABLE_TABLET | ORAL | Status: DC

## 2013-01-05 MED ORDER — SODIUM CHLORIDE 0.9 % IJ SOLN: 3.0000 mL | Freq: Two times a day (BID) | INTRAMUSCULAR | Status: DC

## 2013-01-05 MED ORDER — ACETAMINOPHEN 325 MG PO TABS: 650.0000 mg | ORAL_TABLET | ORAL | Status: DC | PRN

## 2013-01-05 MED ORDER — SODIUM CHLORIDE 0.9 % IV SOLN: INTRAVENOUS | Status: DC

## 2013-01-05 MED ORDER — MIDAZOLAM HCL 2 MG/2ML IJ SOLN
INTRAMUSCULAR | Status: AC
  Filled 2013-01-05: qty 2

## 2013-01-05 NOTE — Telephone Encounter (Signed)
Medication refilled per protocol. 

## 2013-01-05 NOTE — CV Procedure (Signed)
    Cardiac Catheterization Procedure Note  Name: [name] [name] MRN: [id number] DOB: 1940-12-30  Procedure: Left Heart Cath, Selective Coronary Angiography, LV angiography  Indication: Exertional dyspnea, abnormal echocardiogram   Procedural Details: Allen's test on the right wrist was positive.  The right wrist was prepped, draped, and anesthetized with 1% lidocaine. Using the modified Seldinger technique, a 5 French sheath was introduced into the right radial artery. 3 mg of verapamil was administered through the sheath, weight-based unfractionated heparin was administered intravenously. Standard Judkins catheters were used for selective coronary angiography and left ventriculography. Catheter exchanges were performed over an exchange length guidewire. There were no immediate procedural complications. A TR band was used for radial hemostasis at the completion of the procedure.  The patient was transferred to the post catheterization recovery area for further monitoring.  Procedural Findings: Hemodynamics: AO 126/68 LV 119/7  Coronary angiography: Coronary dominance: right  Left mainstem: No significant disease.   Left anterior descending (LAD): Luminal irregularities.   Left circumflex (LCx): There was a small to moderate ramus without significant disease.  The LCx itself was relatively small in caliber.  There were serial 50% and 60% stenoses in the mid LCx.   Right coronary artery (RCA): 50% proximal RCA stenosis.   Left ventriculography: Left ventricular systolic function is normal, LVEF is estimated at 55% without significant wall motion abnormalities noted in RAO projection.   Final Conclusions:  There is moderate coronary disease involving the mid LCx and the proximal RCA.  There are no severe stenoses.  I do not think that coronary disease is causing the patient's exertional dyspnea.  His LVEDP also is not elevated on the higher Lasix dose.  I suspect that at this point  COPD is the major cause of his dyspnea.   Loralie Champagne 01/05/2013 10:25 AM

## 2013-01-05 NOTE — Discharge Instructions (Signed)

## 2013-01-05 NOTE — H&P (View-Only) (Signed)
Patient ID: [name] [name], male   DOB: [birth year], 73 y.o.   MRN: [id number] PCP: Dr. Dennard Schaumann  73 yo with history of HTN, hyperlipidemia, COPD and GERD presents for cardiology followup. He has not been seen here in over 3 years.  When I last saw him, he had a workup for exertional dyspnea.  Myoview did not show ischemia and echo showed normal EF.  He had evidence for moderate COPD on PFTs.   For the last 3 months, he has had significantly increased exertional dyspnea as well as lower leg edema.  He is short of breath if he walks fast or walks up a flight of steps.  No orthopnea or PND. He went to the ER earlier this month with dyspnea (and was discharged home).  BNP has been elevated. He was initially started on Lasix 40 mg bid.  This helped his breathing and his lower extremity edema.  Lasix was decreased to 40 mg daily, and the dyspnea is again worsening. He has exertional dyspnea but not exertional chest pain.  He will get occasional chest tightness at rest that tends to be brief and to have no particular trigger.  Patient had an echo done earlier this month which showed EF 45-50% with inferior and distal septal hypokinesis (different from prior echo).   ECG: NSR, LAFB, iRBBB   Labs (6/10): LDL 81, HDL 66, K 4.5, creatinine 1.04  Labs (4/11): BNP 75  Labs (12/14): BNP 380, K 5, creatinine 1.18, normal cortisol  Allergies (verified):  1) ! Codeine Sulfate (Codeine Sulfate)   Past Medical History:  1. Hypertension  2. Hypercholesterolemia  3. GERD  4. BPH  5. Osteoarthritis: s/p R TKR  6. Multiple sclerosis: Quiescent over last few years. Diagnosed at age 48. Sees neurology at [facility]. 7. Rotator cuff surgery 2/11  8. COPD: Prior heavy smoker. PFTs (3/11): FVC 87%, FEV1 73%, ratio 0.57, DLCO 75%, TLC 121%. Moderate obstructive defect.  9. Exertional dyspnea: Lexiscan myoview (8/09) with EF 57%, diaphragmatic attenuation but no ischemia or infarction. Lexiscan myoview (3/11): EF 68% with  fixed inferior defect likely due to diaphragmatic attenuation and no ischemia (similar to 2009). Echo (4/11): EF 40-98%, mild diastolic dysfunction, moderate mitral regurgitation, mild pulmonary HTN (PASP 38 mmHg), normal RV size and systolic function. PFTs (3/11) with moderate obstructive defect.  Echo (12/14) with EF 50-55%, inferior and apical septal hypokinesis, mild LVH, mild mitral regurgitation.  10. Mitral regurgitation. Echo (4/11) with PISA ERO 0.3 cm^2 and regurgitant volume 41 mL (moderate MR).  Echo (12/14) with mild MR.   11. Carotid dopplers (3/11): no significant stenosis.  12. Possible Type II Brugada ECG pattern. No family history of SCD, no syncope, no tachypalpitations. 13. Dementia: Mild.  14. Low testosterone 15. Bladder spasm   Family History:  Family History Hypertension  Father history of stroke. Mother history of stroke.  Sister with hypertension. Brother with kidney cancer, arthritis and bilateral knee replacements.  No premature CAD  No sudden cardiac death   Social History:  Married with children.  Lives in [place]  Tobacco Use - Former. started at age 14. 2 ppd. quit 1996.  Alcohol Use - no   Review of Systems  All systems reviewed and negative except as per HPI.   Current Outpatient Prescriptions  Medication Sig Dispense Refill  . albuterol (PROVENTIL HFA;VENTOLIN HFA) 108 (90 BASE) MCG/ACT inhaler Inhale 1-2 puffs into the lungs every 6 (six) hours as needed for wheezing or shortness of breath.  1  Inhaler  0  . aspirin EC 81 MG tablet Take 81 mg by mouth daily.       . bisoprolol (ZEBETA) 5 MG tablet Take 2.5 mg by mouth daily.      . diazepam (VALIUM) 5 MG tablet Take 5 mg by mouth every 12 (twelve) hours as needed. For anxiety      . divalproex (DEPAKOTE ER) 500 MG 24 hr tablet Take 1 tablet (500 mg total) by mouth at bedtime.  90 tablet  1  . donepezil (ARICEPT) 5 MG tablet TAKE 2 TABLETS BY MOUTH AT BEDTIME  60 tablet  1  . furosemide (LASIX)  40 MG tablet Take 1 tablet (40 mg total) by mouth 2 (two) times daily.  60 tablet  11  . HYDROcodone-acetaminophen (NORCO) 10-325 MG per tablet Take 1 tablet by mouth every 8 (eight) hours as needed.  100 tablet  0  . losartan (COZAAR) 50 MG tablet TAKE 1 TABLET BY MOUTH EVERY DAY  90 tablet  2  . ofloxacin (FLOXIN) 0.3 % otic solution Place 5 drops into the right ear 2 (two) times daily.  5 mL  0  . omeprazole (PRILOSEC) 20 MG capsule Take 20 mg by mouth daily.        Marland Kitchen rOPINIRole (REQUIP) 1 MG tablet TAKE 1 TABLET BY MOUTH AT BEDTIME  90 tablet  1  . SPIRIVA HANDIHALER 18 MCG inhalation capsule USE 2 INHALATIONS FROM 1 CAPSULE ONLY ONCE DAILY  1 capsule  2  . testosterone cypionate (DEPOTESTOTERONE CYPIONATE) 200 MG/ML injection Inject 1 mL (200 mg total) into the muscle every 14 (fourteen) days.  10 mL  0  . venlafaxine XR (EFFEXOR-XR) 150 MG 24 hr capsule TAKE 1 CAPSULE TWICE A DAY  180 capsule  2   No current facility-administered medications for this visit.    BP 146/82  Pulse 72  Ht 5\' 11"  (1.803 m)  Wt 98.884 kg (218 lb)  BMI 30.42 kg/m2 General: NAD Neck: JVP 8 cm with HJR, no thyromegaly or thyroid nodule.  Lungs: Clear to auscultation bilaterally with normal respiratory effort. CV: Nondisplaced PMI.  Heart regular S1/S2, no S3/S4, no murmur.  1+ edema 1/2 up lower legs L>R.  No carotid bruit.  Normal pedal pulses.  Abdomen: Soft, nontender, no hepatosplenomegaly, no distention.  Skin: Intact without lesions or rashes.  Neurologic: Alert and oriented x 3.  Psych: Normal affect. Extremities: No clubbing or cyanosis.  HEENT: Normal.   Assessment/Plan: 1. Chronic primarily diastolic CHF: Echo with EF 45-50% and wall motion abnormalities elevated BNP, NYHA class II-III symptoms.  He is volume overloaded on exam today.   - Increase Lasix back to 40 mg po bid.   - BMET/BNP in 10 days.  2. Cardiomyopathy: Uncertain etiology.  He has inferior and apical septal wall motion  abnormalities.  Given the significant symptoms and the wall motion abnormalities on echo, I will do a left heart cath to assess for coronary disease as a contributor to his symptom complex.  He should continue ASA 81 mg daily.  If CAD is found, he will need a statin.  3. HTN: BP reasonable on current regimen.  4. COPD: He does not smoke now.  He is on Spiriva.   Loralie Champagne 01/03/2013

## 2013-01-05 NOTE — Interval H&P Note (Signed)
Cath Lab Visit (complete for each Cath Lab visit)  Clinical Evaluation Leading to the Procedure:   ACS: no  Non-ACS:    Anginal Classification: CCS III  Anti-ischemic medical therapy: Minimal Therapy (1 class of medications)  Non-Invasive Test Results: Intermediate-risk stress test findings: cardiac mortality 1-3%/year  Prior CABG: No previous CABG      History and Physical Interval Note:  01/05/2013 9:35 AM  [name] [name]  has presented today for surgery, with the diagnosis of cp  The various methods of treatment have been discussed with the patient and family. After consideration of risks, benefits and other options for treatment, the patient has consented to  Procedure(s): LEFT HEART CATHETERIZATION WITH CORONARY ANGIOGRAM (N/A) as a surgical intervention .  The patient's history has been reviewed, patient examined, no change in status, stable for surgery.  I have reviewed the patient's chart and labs.  Questions were answered to the patient's satisfaction.     Silva Aamodt [employer]

## 2013-01-08 ENCOUNTER — Other Ambulatory Visit: Payer: Self-pay

## 2013-01-08 ENCOUNTER — Other Ambulatory Visit: Payer: Self-pay | Admitting: *Deleted

## 2013-01-08 DIAGNOSIS — I509 Heart failure, unspecified: Secondary | ICD-10-CM

## 2013-01-08 MED ORDER — ATORVASTATIN CALCIUM 20 MG PO TABS: 20.0000 mg | ORAL_TABLET | Freq: Every day | ORAL | Status: DC

## 2013-01-08 MED ORDER — FUROSEMIDE 40 MG PO TABS: 40.0000 mg | ORAL_TABLET | Freq: Two times a day (BID) | ORAL | Status: DC

## 2013-01-10 ENCOUNTER — Other Ambulatory Visit: Payer: Self-pay | Admitting: Family Medicine

## 2013-01-10 NOTE — Telephone Encounter (Signed)
This encounter was created in error - please disregard.

## 2013-01-16 ENCOUNTER — Other Ambulatory Visit (INDEPENDENT_AMBULATORY_CARE_PROVIDER_SITE_OTHER): Payer: Medicare Other

## 2013-01-16 DIAGNOSIS — R0602 Shortness of breath: Secondary | ICD-10-CM | POA: Diagnosis not present

## 2013-01-16 DIAGNOSIS — R079 Chest pain, unspecified: Secondary | ICD-10-CM | POA: Diagnosis not present

## 2013-01-16 DIAGNOSIS — I509 Heart failure, unspecified: Secondary | ICD-10-CM

## 2013-01-16 LAB — BASIC METABOLIC PANEL
BUN: 18 mg/dL (ref 6–23)
CO2: 30 meq/L (ref 19–32)
CREATININE: 1.5 mg/dL (ref 0.4–1.5)
Calcium: 9 mg/dL (ref 8.4–10.5)
Chloride: 99 mEq/L (ref 96–112)
GFR: 50.54 mL/min — ABNORMAL LOW (ref >60.00)
GLUCOSE: 93 mg/dL (ref 70–99)
POTASSIUM: 4.5 meq/L (ref 3.5–5.1)
Sodium: 138 mEq/L (ref 135–145)

## 2013-01-16 LAB — BRAIN NATRIURETIC PEPTIDE: Pro B Natriuretic peptide (BNP): 15 pg/mL (ref 0.0–100.0)

## 2013-01-17 ENCOUNTER — Other Ambulatory Visit: Payer: TRICARE For Life (TFL)

## 2013-02-01 ENCOUNTER — Ambulatory Visit (INDEPENDENT_AMBULATORY_CARE_PROVIDER_SITE_OTHER): Payer: Medicare Other | Admitting: Cardiology

## 2013-02-01 ENCOUNTER — Encounter: Payer: Self-pay | Admitting: Cardiology

## 2013-02-01 VITALS — BP 160/80 | HR 82 | Ht 71.0 in | Wt 217.4 lb

## 2013-02-01 DIAGNOSIS — J438 Other emphysema: Secondary | ICD-10-CM | POA: Diagnosis not present

## 2013-02-01 DIAGNOSIS — I5032 Chronic diastolic (congestive) heart failure: Secondary | ICD-10-CM | POA: Diagnosis not present

## 2013-02-01 DIAGNOSIS — I10 Essential (primary) hypertension: Secondary | ICD-10-CM

## 2013-02-01 DIAGNOSIS — I2510 Atherosclerotic heart disease of native coronary artery without angina pectoris: Secondary | ICD-10-CM | POA: Diagnosis not present

## 2013-02-01 DIAGNOSIS — I428 Other cardiomyopathies: Secondary | ICD-10-CM

## 2013-02-01 DIAGNOSIS — I509 Heart failure, unspecified: Secondary | ICD-10-CM

## 2013-02-01 LAB — LIPID PANEL
CHOL/HDL RATIO: 4
Cholesterol: 138 mg/dL (ref 0–200)
HDL: 37.5 mg/dL — ABNORMAL LOW (ref >39.00)
LDL CALC: 63 mg/dL (ref 0–99)
Triglycerides: 186 mg/dL — ABNORMAL HIGH (ref 0.0–149.0)
VLDL: 37.2 mg/dL (ref 0.0–40.0)

## 2013-02-01 LAB — BASIC METABOLIC PANEL
BUN: 19 mg/dL (ref 6–23)
CO2: 30 mEq/L (ref 19–32)
Calcium: 9 mg/dL (ref 8.4–10.5)
Chloride: 101 mEq/L (ref 96–112)
Creatinine, Ser: 1.2 mg/dL (ref 0.4–1.5)
GFR: 65.9 mL/min (ref >60.00)
GLUCOSE: 103 mg/dL — AB (ref 70–99)
POTASSIUM: 4.2 meq/L (ref 3.5–5.1)
SODIUM: 137 meq/L (ref 135–145)

## 2013-02-01 MED ORDER — AMLODIPINE BESYLATE 5 MG PO TABS: 5.0000 mg | ORAL_TABLET | Freq: Every day | ORAL | Status: DC

## 2013-02-01 NOTE — Progress Notes (Signed)
Patient ID: [name] [name], male   DOB: 1940/02/23, 73 y.o.   MRN: [id number] PCP: Dr. Dennard Schaumann  73 yo with history of HTN, hyperlipidemia, COPD, CAD, and GERD presents for cardiology followup. At a prior appointment, he reported increased exertion dyspnea and echo showed EF 50-55% with wall motion abnormalities concerning for CAD.  I increased his Lasix to 40 mg bid and did a left heart catheterization.  This showed nonobstructive CAD.  Left ventriculogram actually suggested normal EF.   He has been breathing better on the higher Lasix dose.  He does not get short of breath walking on flat ground.  He has mild dyspnea walking up steps.  No chest pain.  BP has been running high.    Labs (6/10): LDL 81, HDL 66, K 4.5, creatinine 1.04  Labs (4/11): BNP 75  Labs (12/14): BNP 380, K 5, creatinine 1.18, normal cortisol Labs (1/15): K 4.5, creatinine 1.5, BNP 15  Allergies (verified):  1) ! Codeine Sulfate (Codeine Sulfate)   Past Medical History:  1. Hypertension  2. Hypercholesterolemia  3. GERD  4. BPH  5. Osteoarthritis: s/p R TKR  6. Multiple sclerosis: Quiescent over last few years. Diagnosed at age 61. Sees neurology at [facility]. 7. Rotator cuff surgery 2/11  8. COPD: Prior heavy smoker. PFTs (3/11): FVC 87%, FEV1 73%, ratio 0.57, DLCO 75%, TLC 121%. Moderate obstructive defect.  9. Exertional dyspnea: Lexiscan myoview (8/09) with EF 57%, diaphragmatic attenuation but no ischemia or infarction. Lexiscan myoview (3/11): EF 68% with fixed inferior defect likely due to diaphragmatic attenuation and no ischemia (similar to 2009). Echo (4/11): EF 84-16%, mild diastolic dysfunction, moderate mitral regurgitation, mild pulmonary HTN (PASP 38 mmHg), normal RV size and systolic function. PFTs (3/11) with moderate obstructive defect.  Echo (12/14) with EF 50-55%, inferior and apical septal hypokinesis, mild LVH, mild mitral regurgitation.  10. Mitral regurgitation. Echo (4/11) with PISA ERO 0.3 cm^2 and  regurgitant volume 41 mL (moderate MR).  Echo (12/14) with mild MR.   11. Carotid dopplers (3/11): no significant stenosis.  12. Possible Type II Brugada ECG pattern. No family history of SCD, no syncope, no tachypalpitations. 13. Dementia: Mild.  14. Low testosterone 15. Bladder spasm  16. CAD: LHC (1/15) with serial 50% and 60% mid LCx stenoses, 50% proximal RCA stenosis, EF 55% on LV-gram, LVEDP 7 mmHg.   Family History:  Family History Hypertension  Father history of stroke. Mother history of stroke.  Sister with hypertension. Brother with kidney cancer, arthritis and bilateral knee replacements.  No premature CAD  No sudden cardiac death   Social History:  Married with children.  Lives in [place]  Tobacco Use - Former. started at age 67. 2 ppd. quit 1996.  Alcohol Use - no   Review of Systems  All systems reviewed and negative except as per HPI.   Current Outpatient Prescriptions  Medication Sig Dispense Refill  . albuterol (PROVENTIL HFA;VENTOLIN HFA) 108 (90 BASE) MCG/ACT inhaler Inhale 1-2 puffs into the lungs every 6 (six) hours as needed for wheezing or shortness of breath.  3 Inhaler  3  . aspirin EC 81 MG tablet Take 81 mg by mouth daily.       Marland Kitchen atorvastatin (LIPITOR) 20 MG tablet Take 1 tablet (20 mg total) by mouth daily.  30 tablet  6  . bisoprolol (ZEBETA) 5 MG tablet Take 2.5 mg by mouth daily.      . divalproex (DEPAKOTE ER) 500 MG 24 hr tablet TAKE 1  TABLET BY MOUTH AT BEDTIME  90 tablet  1  . donepezil (ARICEPT) 5 MG tablet Take 10 mg by mouth at bedtime. Take 2 tablets by mouth at bedtime.      . furosemide (LASIX) 40 MG tablet Take 1 tablet (40 mg total) by mouth 2 (two) times daily.  180 tablet  3  . losartan (COZAAR) 50 MG tablet TAKE 1 TABLET BY MOUTH EVERY DAY  90 tablet  2  . NAPROXEN PO Take 600 mg by mouth daily.      Marland Kitchen omeprazole (PRILOSEC) 20 MG capsule Take 20 mg by mouth daily.        Marland Kitchen rOPINIRole (REQUIP) 1 MG tablet Take 1 mg by mouth at  bedtime.      Marland Kitchen tiotropium (SPIRIVA) 18 MCG inhalation capsule Place 18 mcg into inhaler and inhale daily. Use 2 inhalations from 1 capsule only once daily.      Marland Kitchen venlafaxine XR (EFFEXOR-XR) 150 MG 24 hr capsule Take 150 mg by mouth daily with breakfast. Take 1 capsule twice a day.      Marland Kitchen amLODipine (NORVASC) 5 MG tablet Take 1 tablet (5 mg total) by mouth daily.  30 tablet  6   No current facility-administered medications for this visit.    BP 160/80  Pulse 82  Ht 5\' 11"  (1.803 m)  Wt 98.612 kg (217 lb 6.4 oz)  BMI 30.33 kg/m2 General: NAD Neck: No JVD, no thyromegaly or thyroid nodule.  Lungs: Clear to auscultation bilaterally with normal respiratory effort. CV: Nondisplaced PMI.  Heart regular S1/S2, no S3/S4, no murmur.  No edema (improved).  No carotid bruit.  Normal pedal pulses.  Abdomen: Soft, nontender, no hepatosplenomegaly, no distention.  Skin: Intact without lesions or rashes.  Neurologic: Alert and oriented x 3.  Psych: Normal affect. Extremities: No clubbing or cyanosis.   Assessment/Plan: 1. Chronic diastolic CHF: Echo with EF 50-55% and wall motion abnormalities and elevated BNP.  He was volume overloaded at last appointment.  With increased Lasix, he looks euvolemic and BNP is down to 15.   - Continue current Lasix.  - Will get repeat BMET given recent rise in creatinine.  2. CAD: Nonobstructive CAD on recent cardiac cath.  Continue ASA 81 daily and atorvastatin. 3. HTN: BP is too high.  I will have him add amlodipine 5 mg daily to his regimen.  He will check his BP daily and we will call for readings in 2 wks.  4. COPD: He does not smoke now.  He is on Spiriva. I think that most of his residual exertional dyspnea is likely from COPD.  5. Hyperlipidemia: Check lipids, goal LDL < 70 with CAD.   Followup in 6 months.   Loralie Champagne 02/01/2013

## 2013-02-01 NOTE — Patient Instructions (Signed)
Start amlodipine 5mg  daily.  Your physician recommends that you have  lab work today--Lipid profile/BMET.   Your physician has requested that you regularly monitor and record your blood pressure readings at home. Please use the same machine at the same time of day to check your readings and record them. I will call you in 2 weeks to get the readings. Eliot Ford [phone]  Your physician wants you to follow-up in: 6 months with Dr Aundra Dubin. (July 2015). You will receive a reminder letter in the mail two months in advance. If you don't receive a letter, please call our office to schedule the follow-up appointment.

## 2013-02-05 ENCOUNTER — Other Ambulatory Visit: Payer: Self-pay | Admitting: Family Medicine

## 2013-02-06 ENCOUNTER — Other Ambulatory Visit: Payer: Self-pay | Admitting: Family Medicine

## 2013-02-06 MED ORDER — OMEPRAZOLE 40 MG PO CPDR: DELAYED_RELEASE_CAPSULE | ORAL | Status: DC

## 2013-02-06 NOTE — Telephone Encounter (Signed)
Rx Refilled  

## 2013-02-09 ENCOUNTER — Other Ambulatory Visit: Payer: Self-pay | Admitting: Family Medicine

## 2013-03-05 ENCOUNTER — Telehealth: Payer: Self-pay | Admitting: *Deleted

## 2013-03-05 NOTE — Telephone Encounter (Signed)
Notes copied from 02/01/13 office visit with Dr Aundra Dubin HTN: BP is too high. I will have him add amlodipine 5 mg daily to his regimen. He will check his BP daily and we will call for readings in 2 wks.   03/05/13 LMTCB for pt.

## 2013-03-13 NOTE — Telephone Encounter (Signed)
03/13/13 LMTCB

## 2013-03-14 ENCOUNTER — Telehealth: Payer: Self-pay | Admitting: Cardiology

## 2013-03-14 NOTE — Telephone Encounter (Signed)
BP ok 

## 2013-03-14 NOTE — Telephone Encounter (Signed)
Spoke with pt and confirmed readings listed below.  Yesterday was 122/78 and today 137/73. He is taking amlodipine 5 mg daily as ordered at last office visit

## 2013-03-14 NOTE — Telephone Encounter (Signed)
Follow up        Pt has started doing mild exercises. 1/30 162/91 1/31 168/77 02/01 165/97 02/02 145/89 (pt had done mild exercises this day) pt says it has been within this range for about 2 1/2 weeks. On the day that he did not exercise on March 8th was 149/98. After doing a 2 hour strenuous exercise on 3/9 it dropped to *114/68*  Pt says he took this reading three times and it stayed the same. He believes the exercising is helping his bp but not too sure about the reading for 3/9.

## 2013-03-14 NOTE — Telephone Encounter (Signed)
Tried again to reach pt. No answer. No voicemail 

## 2013-03-14 NOTE — Telephone Encounter (Signed)
Left message to call back  

## 2013-03-14 NOTE — Telephone Encounter (Signed)
bp reading over all readings 155/80 since Jan, pls call [phone]

## 2013-03-14 NOTE — Telephone Encounter (Signed)
Pt.notified

## 2013-03-15 ENCOUNTER — Telehealth: Payer: Self-pay | Admitting: Family Medicine

## 2013-03-15 NOTE — Telephone Encounter (Signed)
ok 

## 2013-03-15 NOTE — Telephone Encounter (Signed)
?   OK to Refill  

## 2013-03-15 NOTE — Telephone Encounter (Signed)
Call back number is [phone] PT is needing a refill on his hydrocodone

## 2013-03-16 MED ORDER — HYDROCODONE-ACETAMINOPHEN 10-660 MG PO TABS: ORAL_TABLET | ORAL | Status: DC

## 2013-03-16 NOTE — Telephone Encounter (Signed)
Prescription printed and patient made aware to come to office to pick up.   Call placed to patient and patient made aware.  

## 2013-03-22 ENCOUNTER — Other Ambulatory Visit: Payer: Self-pay | Admitting: Family Medicine

## 2013-03-22 MED ORDER — HYDROCODONE-ACETAMINOPHEN 10-325 MG PO TABS: 1.0000 | ORAL_TABLET | Freq: Four times a day (QID) | ORAL | Status: DC | PRN

## 2013-03-26 ENCOUNTER — Other Ambulatory Visit: Payer: Self-pay | Admitting: *Deleted

## 2013-03-26 DIAGNOSIS — I5032 Chronic diastolic (congestive) heart failure: Secondary | ICD-10-CM

## 2013-03-26 DIAGNOSIS — I428 Other cardiomyopathies: Secondary | ICD-10-CM

## 2013-03-26 MED ORDER — ATORVASTATIN CALCIUM 20 MG PO TABS: 20.0000 mg | ORAL_TABLET | Freq: Every day | ORAL | Status: DC

## 2013-03-26 MED ORDER — AMLODIPINE BESYLATE 5 MG PO TABS: 5.0000 mg | ORAL_TABLET | Freq: Every day | ORAL | Status: DC

## 2013-04-03 ENCOUNTER — Other Ambulatory Visit: Payer: Self-pay | Admitting: Family Medicine

## 2013-04-03 DIAGNOSIS — N318 Other neuromuscular dysfunction of bladder: Secondary | ICD-10-CM | POA: Diagnosis not present

## 2013-04-03 DIAGNOSIS — N390 Urinary tract infection, site not specified: Secondary | ICD-10-CM | POA: Diagnosis not present

## 2013-04-03 DIAGNOSIS — N138 Other obstructive and reflux uropathy: Secondary | ICD-10-CM | POA: Diagnosis not present

## 2013-04-03 DIAGNOSIS — N401 Enlarged prostate with lower urinary tract symptoms: Secondary | ICD-10-CM | POA: Diagnosis not present

## 2013-04-03 DIAGNOSIS — N3941 Urge incontinence: Secondary | ICD-10-CM | POA: Diagnosis not present

## 2013-04-03 DIAGNOSIS — N320 Bladder-neck obstruction: Secondary | ICD-10-CM | POA: Diagnosis not present

## 2013-04-03 DIAGNOSIS — C680 Malignant neoplasm of urethra: Secondary | ICD-10-CM | POA: Diagnosis not present

## 2013-04-05 DIAGNOSIS — C679 Malignant neoplasm of bladder, unspecified: Secondary | ICD-10-CM | POA: Diagnosis not present

## 2013-04-05 DIAGNOSIS — C680 Malignant neoplasm of urethra: Secondary | ICD-10-CM | POA: Diagnosis not present

## 2013-04-05 DIAGNOSIS — R918 Other nonspecific abnormal finding of lung field: Secondary | ICD-10-CM | POA: Diagnosis not present

## 2013-04-26 ENCOUNTER — Encounter: Payer: Self-pay | Admitting: Family Medicine

## 2013-04-26 ENCOUNTER — Ambulatory Visit (INDEPENDENT_AMBULATORY_CARE_PROVIDER_SITE_OTHER): Payer: Medicare Other | Admitting: Family Medicine

## 2013-04-26 VITALS — BP 100/60 | HR 72 | Temp 97.8°F | Resp 18 | Ht 71.0 in | Wt 214.0 lb

## 2013-04-26 DIAGNOSIS — C801 Malignant (primary) neoplasm, unspecified: Secondary | ICD-10-CM | POA: Insufficient documentation

## 2013-04-26 DIAGNOSIS — M5432 Sciatica, left side: Secondary | ICD-10-CM

## 2013-04-26 DIAGNOSIS — M5430 Sciatica, unspecified side: Secondary | ICD-10-CM

## 2013-04-26 DIAGNOSIS — I2510 Atherosclerotic heart disease of native coronary artery without angina pectoris: Secondary | ICD-10-CM | POA: Diagnosis not present

## 2013-04-26 MED ORDER — PREDNISONE 20 MG PO TABS: ORAL_TABLET | ORAL | Status: DC

## 2013-04-26 MED ORDER — CYCLOBENZAPRINE HCL 10 MG PO TABS: 10.0000 mg | ORAL_TABLET | Freq: Three times a day (TID) | ORAL | Status: DC | PRN

## 2013-04-26 NOTE — Progress Notes (Signed)
Subjective:    Patient ID: [name] [name], male    DOB: 05-Jun-1940, 73 y.o.   MRN: [id number]  HPI Patient presents today with acute onset of low back pain for one week. It is radiating into his left hip down the posterior aspect of his left thigh, and into his left calf and left foot. The pain is severe. He also has palpable muscle spasms in his left lumbar paraspinal muscles. He's been trying Advil with minimal relief. He denies any symptoms of cauda equina syndrome. Past Medical History  Diagnosis Date  . MS (multiple sclerosis)   . Arthritis   . Diastolic dysfunction   . Right bundle branch block   . Brugada syndrome   . COPD (chronic obstructive pulmonary disease)   . GERD (gastroesophageal reflux disease)   . Depression     with bipolar tendencies  . Urinary retention with incomplete bladder emptying     receives botox injections and treatment for BPH through [facility]  . BPH (benign prostatic hyperplasia)   . Cancer     bladder- [facility]  . CAD (coronary artery disease)     50% L circ, 50% RCA 1/15   Current Outpatient Prescriptions on File Prior to Visit  Medication Sig Dispense Refill  . albuterol (PROVENTIL HFA;VENTOLIN HFA) 108 (90 BASE) MCG/ACT inhaler Inhale 1-2 puffs into the lungs every 6 (six) hours as needed for wheezing or shortness of breath.  3 Inhaler  3  . amLODipine (NORVASC) 5 MG tablet Take 1 tablet (5 mg total) by mouth daily.  90 tablet  1  . aspirin EC 81 MG tablet Take 81 mg by mouth daily.       Marland Kitchen atorvastatin (LIPITOR) 20 MG tablet Take 1 tablet (20 mg total) by mouth daily.  90 tablet  1  . bisoprolol (ZEBETA) 5 MG tablet TAKE ONE-HALF (1/2) TABLET DAILY  45 tablet  1  . divalproex (DEPAKOTE ER) 500 MG 24 hr tablet TAKE 1 TABLET BY MOUTH AT BEDTIME  90 tablet  1  . donepezil (ARICEPT) 5 MG tablet Take 10 mg by mouth at bedtime. Take 2 tablets by mouth at bedtime.      . furosemide (LASIX) 40 MG tablet Take 1 tablet (40 mg total) by mouth 2 (two) times  daily.  180 tablet  3  . HYDROcodone-acetaminophen (NORCO) 10-325 MG per tablet Take 1 tablet by mouth every 6 (six) hours as needed.  100 tablet  0  . losartan (COZAAR) 50 MG tablet TAKE 1 TABLET BY MOUTH EVERY DAY  90 tablet  2  . NAPROXEN PO Take 600 mg by mouth daily.      Marland Kitchen omeprazole (PRILOSEC) 20 MG capsule Take 20 mg by mouth daily.        Marland Kitchen omeprazole (PRILOSEC) 40 MG capsule TAKE ONE CAPSULE BY MOUTH EVERY DAY  90 capsule  4  . rOPINIRole (REQUIP) 1 MG tablet Take 1 mg by mouth at bedtime.      Marland Kitchen rOPINIRole (REQUIP) 1 MG tablet TAKE 1 TABLET AT BEDTIME  90 tablet  3  . tiotropium (SPIRIVA) 18 MCG inhalation capsule Place 18 mcg into inhaler and inhale daily. Use 2 inhalations from 1 capsule only once daily.      Marland Kitchen venlafaxine XR (EFFEXOR-XR) 150 MG 24 hr capsule Take 150 mg by mouth daily with breakfast. Take 1 capsule twice a day.       No current facility-administered medications on file prior to visit.  Allergies  Allergen Reactions  . Acetylcholine     unknown  . Alcohol-Sulfur [Sulfur]     unknown  . Amitriptyline     unknown  . Bupivacaine     unknown  . Clomipramine Hcl     unknown  . Cocaine     unknown  . Desipramine     unknown  . Ergonovine     unknown  . Flecainide     unknown  . Lithium     unknown  . Loxapine     unknown  . Nortriptyline     unknown  . Oxcarbazepine     unknown  . Procainamide     unknown  . Procaine     unknwon  . Propafenone     unknown  . Propofol     unknown  . Trifluoperazine     unknown   History   Social History  . Marital Status: Married    Spouse Name: N/A    Number of Children: N/A  . Years of Education: N/A   Occupational History  . Not on file.   Social History Main Topics  . Smoking status: Former Research scientist (life sciences)  . Smokeless tobacco: Not on file  . Alcohol Use: No  . Drug Use: Not on file  . Sexual Activity: Not on file   Other Topics Concern  . Not on file   Social History Narrative  . No  narrative on file      Review of Systems  All other systems reviewed and are negative.      Objective:   Physical Exam  Vitals reviewed. Constitutional: He appears well-developed and well-nourished.  Cardiovascular: Normal rate, regular rhythm and normal heart sounds.   Pulmonary/Chest: Effort normal and breath sounds normal.  Musculoskeletal: He exhibits tenderness.       Lumbar back: He exhibits decreased range of motion, tenderness, pain and spasm. He exhibits no bony tenderness.  Neurological: He has normal reflexes. He displays normal reflexes. He exhibits normal muscle tone.   patient has a positive left straight leg raise        Assessment & Plan:  1. Left sided sciatica Again a prednisone taper pack. I believe the patient likely has a herniated nucleus pulposus. I methylprednisone can help me the inflammation associated with this. Also treat patient's muscle spasms and Flexeril 10 mg by mouth every 8 hours as needed for muscle pain. Recheck in one week or sooner if worse. If patient's pain is getting worse I would start with an x-ray of his lumbar spine. - cyclobenzaprine (FLEXERIL) 10 MG tablet; Take 1 tablet (10 mg total) by mouth 3 (three) times daily as needed for muscle spasms.  Dispense: 30 tablet; Refill: 0 - predniSONE (DELTASONE) 20 MG tablet; 3 tabs poqday 1-2, 2 tabs poqday 3-4, 1 tab poqday 5-6  Dispense: 12 tablet; Refill: 0

## 2013-04-27 DIAGNOSIS — N390 Urinary tract infection, site not specified: Secondary | ICD-10-CM | POA: Diagnosis not present

## 2013-04-27 DIAGNOSIS — N320 Bladder-neck obstruction: Secondary | ICD-10-CM | POA: Diagnosis not present

## 2013-04-27 DIAGNOSIS — N318 Other neuromuscular dysfunction of bladder: Secondary | ICD-10-CM | POA: Diagnosis not present

## 2013-05-04 DIAGNOSIS — Q248 Other specified congenital malformations of heart: Secondary | ICD-10-CM | POA: Diagnosis not present

## 2013-05-04 DIAGNOSIS — G35 Multiple sclerosis: Secondary | ICD-10-CM | POA: Diagnosis not present

## 2013-05-04 DIAGNOSIS — N320 Bladder-neck obstruction: Secondary | ICD-10-CM | POA: Diagnosis not present

## 2013-05-04 DIAGNOSIS — N319 Neuromuscular dysfunction of bladder, unspecified: Secondary | ICD-10-CM | POA: Diagnosis not present

## 2013-05-04 DIAGNOSIS — K219 Gastro-esophageal reflux disease without esophagitis: Secondary | ICD-10-CM | POA: Diagnosis not present

## 2013-05-04 DIAGNOSIS — J449 Chronic obstructive pulmonary disease, unspecified: Secondary | ICD-10-CM | POA: Diagnosis not present

## 2013-05-07 DIAGNOSIS — N320 Bladder-neck obstruction: Secondary | ICD-10-CM | POA: Diagnosis not present

## 2013-05-07 DIAGNOSIS — N318 Other neuromuscular dysfunction of bladder: Secondary | ICD-10-CM | POA: Diagnosis not present

## 2013-05-10 ENCOUNTER — Other Ambulatory Visit: Payer: Self-pay | Admitting: *Deleted

## 2013-05-10 MED ORDER — DIVALPROEX SODIUM ER 500 MG PO TB24: ORAL_TABLET | ORAL | Status: DC

## 2013-05-10 NOTE — Telephone Encounter (Signed)
Refill appropriate and filled per protocol. 

## 2013-05-14 DIAGNOSIS — G35 Multiple sclerosis: Secondary | ICD-10-CM | POA: Diagnosis not present

## 2013-05-15 ENCOUNTER — Telehealth: Payer: Self-pay | Admitting: Cardiology

## 2013-05-15 NOTE — Telephone Encounter (Signed)
Would try to avoid folcalin with cardiac disease (nonobstructive CAD) and difficult to control HTN.  If he feels it is very necessary for quality of life, could use.   Please try to work him in for appt with me in next 2-3 weeks.

## 2013-05-15 NOTE — Telephone Encounter (Signed)
Spoke to patient's wife.  Patient has MS and neurologist would like him to take Folcalin to help improve his concentration and memory.  He took it for a month a several months ago and it seemed to help him. She worries that it is a stimulant and would like Dr. Claris Gladden input re resuming it.  Also he gets out of breath, weak and tired with activities like mopping the floor and has to sit down afterward.  Has next appointment with Dr. Aundra Dubin on 08/24/13, but is requesting a sooner appointment if possible. Will forward message to Dr. Aundra Dubin for advice.

## 2013-05-15 NOTE — Telephone Encounter (Signed)
New message          Pt would like to be seen sooner, can you work this pt in. Also pt neurologist wants the pt to take Focalin XR 10mg  1 capsule per day but it will effect his heart, Is this ok? The neurologist and pt wife would like to know.

## 2013-05-16 DIAGNOSIS — J449 Chronic obstructive pulmonary disease, unspecified: Secondary | ICD-10-CM | POA: Diagnosis not present

## 2013-05-16 DIAGNOSIS — N401 Enlarged prostate with lower urinary tract symptoms: Secondary | ICD-10-CM | POA: Diagnosis not present

## 2013-05-16 DIAGNOSIS — C680 Malignant neoplasm of urethra: Secondary | ICD-10-CM | POA: Diagnosis not present

## 2013-05-16 DIAGNOSIS — I10 Essential (primary) hypertension: Secondary | ICD-10-CM | POA: Diagnosis not present

## 2013-05-16 DIAGNOSIS — C679 Malignant neoplasm of bladder, unspecified: Secondary | ICD-10-CM | POA: Diagnosis not present

## 2013-05-16 DIAGNOSIS — F4310 Post-traumatic stress disorder, unspecified: Secondary | ICD-10-CM | POA: Diagnosis not present

## 2013-05-16 DIAGNOSIS — N320 Bladder-neck obstruction: Secondary | ICD-10-CM | POA: Diagnosis not present

## 2013-05-16 DIAGNOSIS — I454 Nonspecific intraventricular block: Secondary | ICD-10-CM | POA: Diagnosis not present

## 2013-05-16 DIAGNOSIS — N400 Enlarged prostate without lower urinary tract symptoms: Secondary | ICD-10-CM | POA: Diagnosis not present

## 2013-05-16 DIAGNOSIS — N3941 Urge incontinence: Secondary | ICD-10-CM | POA: Diagnosis not present

## 2013-05-16 DIAGNOSIS — N318 Other neuromuscular dysfunction of bladder: Secondary | ICD-10-CM | POA: Diagnosis not present

## 2013-05-16 DIAGNOSIS — E785 Hyperlipidemia, unspecified: Secondary | ICD-10-CM | POA: Diagnosis not present

## 2013-05-16 DIAGNOSIS — G35 Multiple sclerosis: Secondary | ICD-10-CM | POA: Diagnosis not present

## 2013-05-16 NOTE — Telephone Encounter (Signed)
Wife returning call.  Advised him of Dr. Claris Gladden recommendations.  Made him an appointment for 6/1 per Dr. Aundra Dubin.

## 2013-05-16 NOTE — Telephone Encounter (Signed)
lmtcb

## 2013-05-24 ENCOUNTER — Encounter: Payer: Self-pay | Admitting: *Deleted

## 2013-05-31 DIAGNOSIS — C679 Malignant neoplasm of bladder, unspecified: Secondary | ICD-10-CM | POA: Diagnosis not present

## 2013-06-04 ENCOUNTER — Encounter: Payer: Self-pay | Admitting: Cardiology

## 2013-06-04 ENCOUNTER — Ambulatory Visit (INDEPENDENT_AMBULATORY_CARE_PROVIDER_SITE_OTHER): Payer: Medicare Other | Admitting: Cardiology

## 2013-06-04 VITALS — BP 122/70 | HR 74 | Ht 71.0 in | Wt 217.0 lb

## 2013-06-04 DIAGNOSIS — I509 Heart failure, unspecified: Secondary | ICD-10-CM

## 2013-06-04 DIAGNOSIS — I2510 Atherosclerotic heart disease of native coronary artery without angina pectoris: Secondary | ICD-10-CM | POA: Diagnosis not present

## 2013-06-04 DIAGNOSIS — I5032 Chronic diastolic (congestive) heart failure: Secondary | ICD-10-CM | POA: Diagnosis not present

## 2013-06-04 DIAGNOSIS — R0602 Shortness of breath: Secondary | ICD-10-CM

## 2013-06-04 DIAGNOSIS — R5381 Other malaise: Secondary | ICD-10-CM | POA: Diagnosis not present

## 2013-06-04 DIAGNOSIS — J449 Chronic obstructive pulmonary disease, unspecified: Secondary | ICD-10-CM

## 2013-06-04 DIAGNOSIS — R5383 Other fatigue: Secondary | ICD-10-CM

## 2013-06-04 DIAGNOSIS — J438 Other emphysema: Secondary | ICD-10-CM

## 2013-06-04 LAB — CBC WITH DIFFERENTIAL/PLATELET
Basophils Absolute: 0.1 10*3/uL (ref 0.0–0.1)
Basophils Relative: 0.9 % (ref 0.0–3.0)
EOS ABS: 1 10*3/uL — AB (ref 0.0–0.7)
Eosinophils Relative: 8.3 % — ABNORMAL HIGH (ref 0.0–5.0)
HCT: 37.6 % — ABNORMAL LOW (ref 39.0–52.0)
Hemoglobin: 12.4 g/dL — ABNORMAL LOW (ref 13.0–17.0)
Lymphocytes Relative: 11.9 % — ABNORMAL LOW (ref 12.0–46.0)
Lymphs Abs: 1.5 10*3/uL (ref 0.7–4.0)
MCHC: 33.1 g/dL (ref 30.0–36.0)
MCV: 91.3 fl (ref 78.0–100.0)
Monocytes Absolute: 1.2 10*3/uL — ABNORMAL HIGH (ref 0.1–1.0)
Monocytes Relative: 9.4 % (ref 3.0–12.0)
NEUTROS PCT: 69.5 % (ref 43.0–77.0)
Neutro Abs: 8.7 10*3/uL — ABNORMAL HIGH (ref 1.4–7.7)
PLATELETS: 280 10*3/uL (ref 150.0–400.0)
RBC: 4.11 Mil/uL — ABNORMAL LOW (ref 4.22–5.81)
RDW: 18.6 % — AB (ref 11.5–15.5)
WBC: 12.4 10*3/uL — ABNORMAL HIGH (ref 4.0–10.5)

## 2013-06-04 LAB — BASIC METABOLIC PANEL
BUN: 24 mg/dL — AB (ref 6–23)
CO2: 30 meq/L (ref 19–32)
CREATININE: 1.4 mg/dL (ref 0.4–1.5)
Calcium: 9.1 mg/dL (ref 8.4–10.5)
Chloride: 98 mEq/L (ref 96–112)
GFR: 51.3 mL/min — ABNORMAL LOW (ref >60.00)
Glucose, Bld: 105 mg/dL — ABNORMAL HIGH (ref 70–99)
Potassium: 5.1 mEq/L (ref 3.5–5.1)
Sodium: 137 mEq/L (ref 135–145)

## 2013-06-04 LAB — BRAIN NATRIURETIC PEPTIDE: PRO B NATRI PEPTIDE: 61 pg/mL (ref 0.0–100.0)

## 2013-06-04 LAB — TSH: TSH: 1.2 u[IU]/mL (ref 0.35–4.50)

## 2013-06-04 NOTE — Progress Notes (Signed)
Patient ID: [name] [name], male   DOB: 20-Nov-1940, 73 y.o.   MRN: [id number] PCP: Dr. Dennard Schaumann  73 yo with history of HTN, hyperlipidemia, COPD, CAD, and GERD presents for cardiology followup. Earlier this year, he reported increased exertion dyspnea and echo showed EF 50-55% with wall motion abnormalities concerning for CAD.  I increased his Lasix to 40 mg bid and did a left heart catheterization.  This showed nonobstructive CAD.  Left ventriculogram actually suggested normal EF.   Recently, he has felt more tired in general.  He is tired and short of breath doing heavy yardwork or walking up steps.  He is still taking his Lasix and Spiriva.  He feels like his endurance is poor.  He has bladder cancer with urethral obstruction.  It sounds like he will have to get a conduit. Weight is stable.   ECG: NSR, iRBBB  Labs (6/10): LDL 81, HDL 66, K 4.5, creatinine 1.04  Labs (4/11): BNP 75  Labs (12/14): BNP 380, K 5, creatinine 1.18, normal cortisol Labs (1/15): K 4.5, creatinine 1.5 => 1.2, BNP 15, HDL 37.5, LDL 63  Allergies (verified):  1) ! Codeine Sulfate (Codeine Sulfate)   Past Medical History:  1. Hypertension  2. Hypercholesterolemia  3. GERD  4. BPH  5. Osteoarthritis: s/p R TKR  6. Multiple sclerosis: Quiescent over last few years. Diagnosed at age 58. Sees neurology at [facility]. 7. Rotator cuff surgery 2/11  8. COPD: Prior heavy smoker. PFTs (3/11): FVC 87%, FEV1 73%, ratio 0.57, DLCO 75%, TLC 121%. Moderate obstructive defect.  9. Exertional dyspnea: Lexiscan myoview (8/09) with EF 57%, diaphragmatic attenuation but no ischemia or infarction. Lexiscan myoview (3/11): EF 68% with fixed inferior defect likely due to diaphragmatic attenuation and no ischemia (similar to 2009). Echo (4/11): EF 40-98%, mild diastolic dysfunction, moderate mitral regurgitation, mild pulmonary HTN (PASP 38 mmHg), normal RV size and systolic function. PFTs (3/11) with moderate obstructive defect.  Echo (12/14)  with EF 50-55%, inferior and apical septal hypokinesis, mild LVH, mild mitral regurgitation.  10. Mitral regurgitation. Echo (4/11) with PISA ERO 0.3 cm^2 and regurgitant volume 41 mL (moderate MR).  Echo (12/14) with mild MR.   11. Carotid dopplers (3/11): no significant stenosis.  12. Possible Type II Brugada ECG pattern. No family history of SCD, no syncope, no tachypalpitations. 13. Dementia: Mild.  14. Low testosterone 15. Bladder cancer: Followed at [place]. CAD: LHC (1/15) with serial 50% and 60% mid LCx stenoses, 50% proximal RCA stenosis, EF 55% on LV-gram, LVEDP 7 mmHg.   Family History:  Family History Hypertension  Father history of stroke. Mother history of stroke.  Sister with hypertension. Brother with kidney cancer, arthritis and bilateral knee replacements.  No premature CAD  No sudden cardiac death   Social History:  Married with children.  Lives in [place]  Tobacco Use - Former. started at age 75. 2 ppd. quit 1996.  Alcohol Use - no   Review of Systems  All systems reviewed and negative except as per HPI.   Current Outpatient Prescriptions  Medication Sig Dispense Refill  . albuterol (PROVENTIL HFA;VENTOLIN HFA) 108 (90 BASE) MCG/ACT inhaler Inhale 1-2 puffs into the lungs every 6 (six) hours as needed for wheezing or shortness of breath.  3 Inhaler  3  . amLODipine (NORVASC) 5 MG tablet Take 1 tablet (5 mg total) by mouth daily.  90 tablet  1  . aspirin EC 81 MG tablet Take 81 mg by mouth daily.       Marland Kitchen  atorvastatin (LIPITOR) 20 MG tablet Take 1 tablet (20 mg total) by mouth daily.  90 tablet  1  . bisoprolol (ZEBETA) 5 MG tablet TAKE ONE-HALF (1/2) TABLET DAILY  45 tablet  1  . cyclobenzaprine (FLEXERIL) 10 MG tablet Take 1 tablet (10 mg total) by mouth 3 (three) times daily as needed for muscle spasms.  30 tablet  0  . dexmethylphenidate (FOCALIN XR) 10 MG 24 hr capsule Take 10 mg by mouth daily.      . divalproex (DEPAKOTE ER) 500 MG 24 hr tablet TAKE 1  TABLET BY MOUTH AT BEDTIME  90 tablet  1  . donepezil (ARICEPT) 5 MG tablet Take 10 mg by mouth at bedtime. Take 2 tablets by mouth at bedtime.      . furosemide (LASIX) 40 MG tablet Take 1 tablet (40 mg total) by mouth 2 (two) times daily.  180 tablet  3  . HYDROcodone-acetaminophen (NORCO) 10-325 MG per tablet Take 1 tablet by mouth every 6 (six) hours as needed.  100 tablet  0  . losartan (COZAAR) 50 MG tablet TAKE 1 TABLET BY MOUTH EVERY DAY  90 tablet  2  . NAPROXEN PO Take 600 mg by mouth daily.      Marland Kitchen omeprazole (PRILOSEC) 40 MG capsule TAKE ONE CAPSULE BY MOUTH EVERY DAY  90 capsule  4  . rOPINIRole (REQUIP) 1 MG tablet Take 1 mg by mouth at bedtime.      Marland Kitchen tiotropium (SPIRIVA) 18 MCG inhalation capsule Place 18 mcg into inhaler and inhale daily. Use 2 inhalations from 1 capsule only once daily.      Marland Kitchen venlafaxine XR (EFFEXOR-XR) 150 MG 24 hr capsule Take 150 mg by mouth daily with breakfast. Take 1 capsule twice a day.      . predniSONE (DELTASONE) 20 MG tablet 3 tabs poqday 1-2, 2 tabs poqday 3-4, 1 tab poqday 5-6  12 tablet  0   No current facility-administered medications for this visit.    BP 122/70  Pulse 74  Ht 5\' 11"  (1.803 m)  Wt 98.431 kg (217 lb)  BMI 30.28 kg/m2 General: NAD Neck: No JVD, no thyromegaly or thyroid nodule.  Lungs: Clear to auscultation bilaterally with normal respiratory effort. CV: Nondisplaced PMI.  Heart regular S1/S2, no S3/S4, no murmur.  Trace ankle edema.  No carotid bruit.  Normal pedal pulses.  Abdomen: Soft, nontender, no hepatosplenomegaly, no distention.  Skin: Intact without lesions or rashes.  Neurologic: Alert and oriented x 3.  Psych: Normal affect. Extremities: No clubbing or cyanosis.   Assessment/Plan: 1. Chronic diastolic CHF: Echo with EF 50-55% and wall motion abnormalities.  He initially improved on Lasix.  However, today reports increased fatigue and dyspnea.  He is not volume overloaded on exam today. - Continue current  Lasix and check BMET and BNP.  With significant dyspnea and fatigue will check CBC for anemia.  2. CAD: Nonobstructive CAD on recent cardiac cath.  Continue ASA 81 daily and atorvastatin. 3. HTN: BP controlled with addition of Spiriva.  4. COPD: He does not smoke now.  I wonder if COPD is not playing a major role in dyspnea and fatigue.  I am going to refer him to pulmonary for evaluation.  5. Hyperlipidemia: Good lipids in 1/15.  Continue statin.   Larey Dresser 06/04/2013

## 2013-06-04 NOTE — Patient Instructions (Signed)
Your physician recommends that you have  lab work today--BMET/BNP/CBCd/TSH.  You have been referred to Dr Chase Caller at [facility].   Your physician wants you to follow-up in: 6 months with Dr Aundra Dubin. (December 2015).  You will receive a reminder letter in the mail two months in advance. If you don't receive a letter, please call our office to schedule the follow-up appointment.

## 2013-06-07 ENCOUNTER — Ambulatory Visit (INDEPENDENT_AMBULATORY_CARE_PROVIDER_SITE_OTHER): Payer: Medicare Other | Admitting: Family Medicine

## 2013-06-07 ENCOUNTER — Encounter: Payer: Self-pay | Admitting: Family Medicine

## 2013-06-07 VITALS — BP 110/68 | HR 78 | Temp 96.7°F | Resp 18 | Ht 71.0 in | Wt 209.0 lb

## 2013-06-07 DIAGNOSIS — F329 Major depressive disorder, single episode, unspecified: Secondary | ICD-10-CM | POA: Diagnosis not present

## 2013-06-07 DIAGNOSIS — F32A Depression, unspecified: Secondary | ICD-10-CM

## 2013-06-07 DIAGNOSIS — F3289 Other specified depressive episodes: Secondary | ICD-10-CM | POA: Diagnosis not present

## 2013-06-07 DIAGNOSIS — F319 Bipolar disorder, unspecified: Secondary | ICD-10-CM

## 2013-06-07 DIAGNOSIS — I2510 Atherosclerotic heart disease of native coronary artery without angina pectoris: Secondary | ICD-10-CM

## 2013-06-07 DIAGNOSIS — R7309 Other abnormal glucose: Secondary | ICD-10-CM

## 2013-06-07 LAB — HEMOGLOBIN A1C, FINGERSTICK: Hgb A1C (fingerstick): 5.8 % — ABNORMAL HIGH (ref <5.7)

## 2013-06-07 MED ORDER — METHYLPHENIDATE HCL ER (CD) 10 MG PO CPCR: 10.0000 mg | ORAL_CAPSULE | ORAL | Status: DC

## 2013-06-08 ENCOUNTER — Encounter: Payer: Self-pay | Admitting: Family Medicine

## 2013-06-08 NOTE — Progress Notes (Signed)
Subjective:    Patient ID: [name] [name], male    DOB: 12/14/1940, 73 y.o.   MRN: [id number]  HPI Patient has a history of multiple sclerosis. In the past he was treated with Provigil for chronic fatigue symptoms and depression related to this. Recently the fatigue and depression worsened. Psychiatry started the patient on Focalin XR 10 mg by mouth daily. Patient's symptoms improved dramatically on the Focalin. Provigil was discontinued. Unfortunately insurance will not cover Focalin. They would like to switch the medication to something that his insurance would cover. He also has bipolar disorder which is currently managed with depakote as a mood stabilizer.  Patient denies any symptoms of mania, hallucinations, or delusions. Past Medical History  Diagnosis Date  . MS (multiple sclerosis)   . Arthritis   . Diastolic dysfunction   . Right bundle branch block   . Brugada syndrome   . COPD (chronic obstructive pulmonary disease)   . GERD (gastroesophageal reflux disease)   . Depression     with bipolar tendencies  . Urinary retention with incomplete bladder emptying     receives botox injections and treatment for BPH through [facility]  . BPH (benign prostatic hyperplasia)   . Bladder cancer     [facility]  . CAD (coronary artery disease)     50% L circ, 50% RCA 1/15  . HLD (hyperlipidemia)   . HTN (hypertension)   . CHF (congestive heart failure)   . Cardiac hypertrophy    Past Surgical History  Procedure Laterality Date  . Total knee arthroplasty Right   . Transurethral resection of prostate    . Tonsillectomy    . Appendectomy    . Rotator cuff repair    . Hemorrhoid surgery     Current Outpatient Prescriptions on File Prior to Visit  Medication Sig Dispense Refill  . albuterol (PROVENTIL HFA;VENTOLIN HFA) 108 (90 BASE) MCG/ACT inhaler Inhale 1-2 puffs into the lungs every 6 (six) hours as needed for wheezing or shortness of breath.  3 Inhaler  3  . amLODipine (NORVASC) 5 MG  tablet Take 1 tablet (5 mg total) by mouth daily.  90 tablet  1  . aspirin EC 81 MG tablet Take 81 mg by mouth daily.       Marland Kitchen atorvastatin (LIPITOR) 20 MG tablet Take 1 tablet (20 mg total) by mouth daily.  90 tablet  1  . bisoprolol (ZEBETA) 5 MG tablet TAKE ONE-HALF (1/2) TABLET DAILY  45 tablet  1  . cyclobenzaprine (FLEXERIL) 10 MG tablet Take 1 tablet (10 mg total) by mouth 3 (three) times daily as needed for muscle spasms.  30 tablet  0  . dexmethylphenidate (FOCALIN XR) 10 MG 24 hr capsule Take 10 mg by mouth daily.      . divalproex (DEPAKOTE ER) 500 MG 24 hr tablet TAKE 1 TABLET BY MOUTH AT BEDTIME  90 tablet  1  . donepezil (ARICEPT) 5 MG tablet Take 10 mg by mouth at bedtime. Take 2 tablets by mouth at bedtime.      . furosemide (LASIX) 40 MG tablet Take 1 tablet (40 mg total) by mouth 2 (two) times daily.  180 tablet  3  . HYDROcodone-acetaminophen (NORCO) 10-325 MG per tablet Take 1 tablet by mouth every 6 (six) hours as needed.  100 tablet  0  . losartan (COZAAR) 50 MG tablet TAKE 1 TABLET BY MOUTH EVERY DAY  90 tablet  2  . NAPROXEN PO Take 600 mg by mouth  daily.      . omeprazole (PRILOSEC) 40 MG capsule TAKE ONE CAPSULE BY MOUTH EVERY DAY  90 capsule  4  . rOPINIRole (REQUIP) 1 MG tablet Take 1 mg by mouth at bedtime.      Marland Kitchen tiotropium (SPIRIVA) 18 MCG inhalation capsule Place 18 mcg into inhaler and inhale daily. Use 2 inhalations from 1 capsule only once daily.      Marland Kitchen venlafaxine XR (EFFEXOR-XR) 150 MG 24 hr capsule Take 150 mg by mouth daily with breakfast. Take 1 capsule twice a day.       No current facility-administered medications on file prior to visit.   Allergies  Allergen Reactions  . Acetylcholine     unknown  . Alcohol-Sulfur [Sulfur]     unknown  . Amitriptyline     unknown  . Bupivacaine     unknown  . Clomipramine Hcl     unknown  . Cocaine     unknown  . Desipramine     unknown  . Ergonovine     unknown  . Flecainide     unknown  . Lithium      unknown  . Loxapine     unknown  . Nortriptyline     unknown  . Oxcarbazepine     unknown  . Procainamide     unknown  . Procaine     unknwon  . Propafenone     unknown  . Propofol     unknown  . Trifluoperazine     unknown   History   Social History  . Marital Status: Married    Spouse Name: N/A    Number of Children: N/A  . Years of Education: N/A   Occupational History  . Not on file.   Social History Main Topics  . Smoking status: Former Research scientist (life sciences)  . Smokeless tobacco: Not on file  . Alcohol Use: No  . Drug Use: Not on file  . Sexual Activity: Not on file   Other Topics Concern  . Not on file   Social History Narrative  . No narrative on file      Review of Systems  All other systems reviewed and are negative.      Objective:   Physical Exam  Vitals reviewed. Constitutional: He is oriented to person, place, and time.  Cardiovascular: Normal rate, regular rhythm and normal heart sounds.   Pulmonary/Chest: Effort normal and breath sounds normal.  Neurological: He is alert and oriented to person, place, and time.  Psychiatric: He has a normal mood and affect. His behavior is normal. Judgment and thought content normal.          Assessment & Plan:  Other abnormal glucose - Plan: Hemoglobin A1C, fingerstick  Depression  Bipolar disorder, unspecified  Switch focalin xr to methylphenidate er 10 mg poqam and recheck HgA1c as his last A1c was 6.2 in 10/14.

## 2013-06-18 ENCOUNTER — Telehealth: Payer: Self-pay | Admitting: Family Medicine

## 2013-06-18 MED ORDER — HYDROCODONE-ACETAMINOPHEN 10-325 MG PO TABS: 1.0000 | ORAL_TABLET | Freq: Four times a day (QID) | ORAL | Status: DC | PRN

## 2013-06-18 MED ORDER — TESTOSTERONE CYPIONATE 200 MG/ML IM SOLN: 200.0000 mg | INTRAMUSCULAR | Status: DC

## 2013-06-18 NOTE — Telephone Encounter (Signed)
Med for the testosterone and hydrocodone printed signed and left up front and pt's wife aware to pick up.

## 2013-06-18 NOTE — Telephone Encounter (Signed)
?   OK to Refill - Last refill 03/16/2013

## 2013-06-18 NOTE — Telephone Encounter (Signed)
Approved.  

## 2013-06-18 NOTE — Telephone Encounter (Signed)
[phone] Patient calling for refill of his hydrocodone if possible

## 2013-06-18 NOTE — Telephone Encounter (Signed)
Message copied by Alyson Locket on Mon Jun 18, 2013  2:11 PM ------      Message from: Devoria Glassing      Created: Mon Jun 18, 2013 10:19 AM       Patient would like to know if he is still supposed to be taking his Testerone shots if so he is going to need a rx for this  [phone] ------

## 2013-06-20 ENCOUNTER — Telehealth: Payer: Self-pay | Admitting: Family Medicine

## 2013-06-20 MED ORDER — PREDNISONE 20 MG PO TABS: ORAL_TABLET | ORAL | Status: DC

## 2013-06-20 NOTE — Telephone Encounter (Signed)
Med sent to pharm and pt aware 

## 2013-06-20 NOTE — Telephone Encounter (Signed)
Spoke to pt and he never got the RX for prednisone for his back pain in April 2015. It looks like [facility] sent it to mail order and mail order did not send it. Pt is wanting to know if we can send in another rx to [facility]???? (it was a tapering dose of pred.)

## 2013-06-20 NOTE — Telephone Encounter (Signed)
Okay to send prednisone

## 2013-06-20 NOTE — Telephone Encounter (Signed)
Message copied by Alyson Locket on Wed Jun 20, 2013  3:51 PM ------      Message from: Devoria Glassing      Created: Wed Jun 20, 2013 12:27 PM       [phone]       Patient would like to talk to you about he never got his last rx for prednisone filled, and now he is having terrible back pain, said the pharmacy does not have record of him ever bringing it to them. Could we call him back regarding this and the pain he is having in his back  ------

## 2013-06-21 ENCOUNTER — Other Ambulatory Visit: Payer: Self-pay | Admitting: Family Medicine

## 2013-06-21 NOTE — Telephone Encounter (Signed)
Okay to refill? 

## 2013-06-21 NOTE — Telephone Encounter (Signed)
Ok to refill??  Last office visit 06/07/2013. Was seen for Sciatica on 04/26/2013.   Last refill 04/26/2013.

## 2013-06-22 ENCOUNTER — Ambulatory Visit (INDEPENDENT_AMBULATORY_CARE_PROVIDER_SITE_OTHER): Payer: Medicare Other | Admitting: Internal Medicine

## 2013-06-22 ENCOUNTER — Encounter: Payer: Self-pay | Admitting: Internal Medicine

## 2013-06-22 VITALS — BP 120/62 | HR 77 | Ht 72.0 in | Wt 216.2 lb

## 2013-06-22 DIAGNOSIS — R0600 Dyspnea, unspecified: Secondary | ICD-10-CM

## 2013-06-22 DIAGNOSIS — R0602 Shortness of breath: Secondary | ICD-10-CM

## 2013-06-22 DIAGNOSIS — R0609 Other forms of dyspnea: Secondary | ICD-10-CM

## 2013-06-22 DIAGNOSIS — R0989 Other specified symptoms and signs involving the circulatory and respiratory systems: Secondary | ICD-10-CM | POA: Diagnosis not present

## 2013-06-22 DIAGNOSIS — R0689 Other abnormalities of breathing: Principal | ICD-10-CM

## 2013-06-22 DIAGNOSIS — I2510 Atherosclerotic heart disease of native coronary artery without angina pectoris: Secondary | ICD-10-CM | POA: Diagnosis not present

## 2013-06-22 NOTE — Progress Notes (Signed)
Subjective:    Patient ID: [name] [name], male    DOB: 1940-04-17, 73 y.o.   MRN: [id number]  HPI    OV 06/22/2013  Chief Complaint  Patient presents with  . Pulmonary Consult    HFU per [name]  for COPD and SOB.    Dyspnea and COPD referral from Dr Aundra Dubin  73 year old male. Use to run 5-20 miles each day till 10-20 years ago.HE is ex smoker 80 pack; quit 20 years ago followed by weight gain. THen several health issues including MS and bladder cancer and diastolic cHF.  Past few years insidious onset dyspnea, progresive, moderate in intensity with some asocited cough maybe. Brought on for climbing 1 flight of stairs and relieved by rest. THere is currently no associatd orthopnea, edema, wheeze  Dyspnea relevant hx    Exertional dyspnea: Lexiscan myoview (8/09) with EF 57%, diaphragmatic attenuation but no ischemia or infarction. Lexiscan myoview (3/11): EF 68% with fixed inferior defect likely due to diaphragmatic attenuation and no ischemia (similar to 2009). Echo (4/11): EF 86-76%, mild diastolic dysfunction, moderate mitral regurgitation, mild pulmonary HTN (PASP 38 mmHg), normal RV size and systolic function. PFTs (3/11) with moderate obstructive defect. Echo (12/14) with EF 50-55%, inferior and apical septal hypokinesis, mild LVH, mild mitral regurgitation.   CAD: LHC (1/15) with serial 50% and 60% mid LCx stenoses, 50% proximal RCA stenosis, EF 55% on LV-gram, LVEDP 7 mmHg.    COPD noted: Prior heavy smoker. PFTs (3/11): FVC 87%, FEV1 73%, ratio 0.57, DLCO 75%, TLC 121%. Moderate obstructive defect.  - per DR Aundra Dubin note. RX spiriva with some help    CXR 12/06/12: ? Chronic ILD changes - my interpretation   Walking desaturation test today in the office June 22 2013:180 feet x3 laps on room air: Dropped to 89% at the end of the third lap but promptly improved to 92% when he rested. He did not have any complaints   Past Medical History  Diagnosis Date  . MS  (multiple sclerosis)   . Arthritis   . Diastolic dysfunction   . Right bundle branch block   . Brugada syndrome   . COPD (chronic obstructive pulmonary disease)   . GERD (gastroesophageal reflux disease)   . Depression     with bipolar tendencies  . Urinary retention with incomplete bladder emptying     receives botox injections and treatment for BPH through [facility]  . BPH (benign prostatic hyperplasia)   . Bladder cancer     [facility]  . CAD (coronary artery disease)     50% L circ, 50% RCA 1/15  . HLD (hyperlipidemia)   . HTN (hypertension)   . CHF (congestive heart failure)   . Cardiac hypertrophy      Family History  Problem Relation Age of Onset  . Stroke Mother   . Stroke Father   . Hypertension Sister   . Kidney cancer Brother   . Arthritis Brother   . Other Brother     knee problems, Bil TKR  . Hypertension      family history     History   Social History  . Marital Status: Married    Spouse Name: N/A    Number of Children: N/A  . Years of Education: N/A   Occupational History  . retired    Social History Main Topics  . Smoking status: Former Smoker -- 2.00 packs/day for 40 years    Types: Cigarettes  Quit date: 01/04/1993  . Smokeless tobacco: Former Systems developer    Types: Chew  . Alcohol Use: Yes     Comment: 6 pack/day  . Drug Use: Not on file  . Sexual Activity: Not on file   Other Topics Concern  . Not on file   Social History Narrative  . No narrative on file     Allergies  Allergen Reactions  . Acetylcholine     unknown  . Alcohol-Sulfur [Sulfur]     unknown  . Amitriptyline     unknown  . Bupivacaine     unknown  . Clomipramine Hcl     unknown  . Cocaine     unknown  . Desipramine     unknown  . Ergonovine     unknown  . Flecainide     unknown  . Lithium     unknown  . Loxapine     unknown  . Nortriptyline     unknown  . Oxcarbazepine     unknown  . Procainamide     unknown  . Procaine     unknwon  . Propafenone      unknown  . Propofol     unknown  . Trifluoperazine     unknown     Outpatient Prescriptions Prior to Visit  Medication Sig Dispense Refill  . albuterol (PROVENTIL HFA;VENTOLIN HFA) 108 (90 BASE) MCG/ACT inhaler Inhale 1-2 puffs into the lungs every 6 (six) hours as needed for wheezing or shortness of breath.  3 Inhaler  3  . amLODipine (NORVASC) 5 MG tablet Take 1 tablet (5 mg total) by mouth daily.  90 tablet  1  . aspirin EC 81 MG tablet Take 81 mg by mouth daily.       Marland Kitchen atorvastatin (LIPITOR) 20 MG tablet Take 1 tablet (20 mg total) by mouth daily.  90 tablet  1  . bisoprolol (ZEBETA) 5 MG tablet TAKE ONE-HALF (1/2) TABLET DAILY  45 tablet  1  . cyclobenzaprine (FLEXERIL) 10 MG tablet TAKE 1 TABLET BY MOUTH 3 TIMES A DAY AS NEEDED FOR MUSCLE SPASMS  30 tablet  2  . dexmethylphenidate (FOCALIN XR) 10 MG 24 hr capsule Take 10 mg by mouth daily.      . divalproex (DEPAKOTE ER) 500 MG 24 hr tablet TAKE 1 TABLET BY MOUTH AT BEDTIME  90 tablet  1  . donepezil (ARICEPT) 5 MG tablet Take 10 mg by mouth at bedtime. Take 2 tablets by mouth at bedtime.      . furosemide (LASIX) 40 MG tablet Take 1 tablet (40 mg total) by mouth 2 (two) times daily.  180 tablet  3  . HYDROcodone-acetaminophen (NORCO) 10-325 MG per tablet Take 1 tablet by mouth every 6 (six) hours as needed.  100 tablet  0  . losartan (COZAAR) 50 MG tablet TAKE 1 TABLET BY MOUTH EVERY DAY  90 tablet  2  . methylphenidate (METADATE CD) 10 MG CR capsule Take 1 capsule (10 mg total) by mouth every morning.  30 capsule  0  . NAPROXEN PO Take 600 mg by mouth daily.      Marland Kitchen omeprazole (PRILOSEC) 40 MG capsule TAKE ONE CAPSULE BY MOUTH EVERY DAY  90 capsule  4  . predniSONE (DELTASONE) 20 MG tablet 3 tabs po qd x 2 days, 2 tabs po qd x 2 days, 1 tab po qd x 2 days  12 tablet  0  . rOPINIRole (REQUIP) 1 MG tablet Take 1  mg by mouth at bedtime.      Marland Kitchen tiotropium (SPIRIVA) 18 MCG inhalation capsule Place 18 mcg into inhaler and inhale daily.  Use 2 inhalations from 1 capsule only once daily.      Marland Kitchen venlafaxine XR (EFFEXOR-XR) 150 MG 24 hr capsule Take 150 mg by mouth daily with breakfast. Take 1 capsule twice a day.      . testosterone cypionate (DEPOTESTOTERONE CYPIONATE) 200 MG/ML injection Inject 1 mL (200 mg total) into the muscle every 14 (fourteen) days.  10 mL  5   No facility-administered medications prior to visit.      Review of Systems  Constitutional: Negative for fever and unexpected weight change.  HENT: Negative for congestion, dental problem, ear pain, nosebleeds, postnasal drip, rhinorrhea, sinus pressure, sneezing, sore throat and trouble swallowing.   Eyes: Negative for redness and itching.  Respiratory: Positive for shortness of breath. Negative for cough, chest tightness and wheezing.   Cardiovascular: Negative for palpitations and leg swelling.  Gastrointestinal: Negative for nausea and vomiting.  Genitourinary: Negative for dysuria.  Musculoskeletal: Negative for joint swelling.  Skin: Negative for rash.  Neurological: Negative for headaches.  Hematological: Does not bruise/bleed easily.  Psychiatric/Behavioral: Negative for dysphoric mood. The patient is not nervous/anxious.        Objective:   Physical Exam  Nursing note and vitals reviewed. Constitutional: He is oriented to person, place, and time. He appears well-developed and well-nourished. No distress.  HENT:  Head: Normocephalic and atraumatic.  Right Ear: External ear normal.  Left Ear: External ear normal.  Mouth/Throat: Oropharynx is clear and moist. No oropharyngeal exudate.  Hearing aid  Eyes: Conjunctivae and EOM are normal. Pupils are equal, round, and reactive to light. Right eye exhibits no discharge. Left eye exhibits no discharge. No scleral icterus.  Neck: Normal range of motion. Neck supple. No JVD present. No tracheal deviation present. No thyromegaly present.  Cardiovascular: Normal rate, regular rhythm and intact distal  pulses.  Exam reveals no gallop and no friction rub.   No murmur heard. Pulmonary/Chest: Effort normal and breath sounds normal. No respiratory distress. He has no wheezes. He has no rales. He exhibits no tenderness.  ? Crackls v hoarse  Abdominal: Soft. Bowel sounds are normal. He exhibits no distension and no mass. There is no tenderness. There is no rebound and no guarding.  Visceral obesity +  Musculoskeletal: Normal range of motion. He exhibits no edema and no tenderness.  Lymphadenopathy:    He has no cervical adenopathy.  Neurological: He is alert and oriented to person, place, and time. He has normal reflexes. No cranial nerve deficit. Coordination normal.  Skin: Skin is warm and dry. No rash noted. He is not diaphoretic. No erythema. No pallor.  Psychiatric: He has a normal mood and affect. His behavior is normal. Judgment and thought content normal.    Filed Vitals:   06/22/13 1550  BP: 120/62  Pulse: 77  Height: 6' (1.829 m)  Weight: 216 lb 3.2 oz (98.068 kg)  SpO2: 96%          Assessment & Plan:  #Shortness of breath  - likely due to many reasons from weight gain, stopping running, copd, heart issues, cancer fatigue  - I am concerned that based on CXR that you might have interstitial lung disease (ILD) as another cause of shortness of breath - we need to sort these out  - do full PFT breathing test  -Do  High Resolution CT chest without  contrast on ILD protocol (indication dyspnea)   -  Only  Dr Lorin Picket or Dr. Vinnie Langton to read  #[place]   - return to see my NP [name] [name] or me < 3 weeks from 06/22/2013 but after finishing PFT and CT

## 2013-06-22 NOTE — Patient Instructions (Addendum)
#  Shortness of breath  - likely due to many reasons from weight gain, stopping running, copd, heart issues, cancer fatigue  - I am concerned that based on CXR that you might have interstitial lung disease (ILD) as another cause of shortness of breath - we need to sort these out  - do full PFT breathing test  -Do  High Resolution CT chest without contrast on ILD protocol (indication dyspnea)   -  Only  Dr Lorin Picket or Dr. Vinnie Langton to read  #F[place]oup   - return to see my NP Tammy or me < 3 weeks from 06/22/2013 but after finishing PFT and CT

## 2013-06-24 NOTE — Assessment & Plan Note (Signed)
#  Shortness of breath  - likely due to many reasons from weight gain, stopping running, copd, heart issues, cancer fatigue  - I am concerned that based on CXR that you might have interstitial lung disease (ILD) as another cause of shortness of breath - we need to sort these out  - do full PFT breathing test  -Do  High Resolution CT chest without contrast on ILD protocol (indication dyspnea)   -  Only  Dr Lorin Picket or Dr. Vinnie Langton to read  #F[place]oup   - return to see my NP Tammy or me < 3 weeks from 06/22/2013 but after finishing PFT and CT

## 2013-06-26 ENCOUNTER — Ambulatory Visit (INDEPENDENT_AMBULATORY_CARE_PROVIDER_SITE_OTHER)
Admission: RE | Admit: 2013-06-26 | Discharge: 2013-06-26 | Disposition: A | Discharge status: Home / Self Care | Payer: Medicare Other | Source: Ambulatory Visit | Attending: Internal Medicine | Admitting: Internal Medicine

## 2013-06-26 ENCOUNTER — Telehealth: Payer: Self-pay | Admitting: Internal Medicine

## 2013-06-26 DIAGNOSIS — R0989 Other specified symptoms and signs involving the circulatory and respiratory systems: Secondary | ICD-10-CM | POA: Diagnosis not present

## 2013-06-26 DIAGNOSIS — R0609 Other forms of dyspnea: Secondary | ICD-10-CM

## 2013-06-26 DIAGNOSIS — R911 Solitary pulmonary nodule: Secondary | ICD-10-CM | POA: Diagnosis not present

## 2013-06-26 DIAGNOSIS — R0600 Dyspnea, unspecified: Secondary | ICD-10-CM

## 2013-06-26 NOTE — Telephone Encounter (Signed)
Let patient know  1. No ILD to explain dyspnea.  But some emphysema present  2. Lung nodules +. Will be explained at followup. Will need repeat ct chest   Dr. Brand Males, M.D., [facility].C.P Pulmonary and Critical Care Medicine Staff Physician [facility] Pager: [phone], If no answer or between  15:00h - 7:00h: call [phone]  06/26/2013 7:51 PM

## 2013-06-27 ENCOUNTER — Other Ambulatory Visit: Payer: Self-pay | Admitting: Family Medicine

## 2013-06-27 ENCOUNTER — Inpatient Hospital Stay: Admission: RE | Admit: 2013-06-27 | Payer: Medicare Other | Source: Ambulatory Visit

## 2013-06-28 NOTE — Telephone Encounter (Signed)
MR- when did you want his repeat CT because he just had the HRCT this month? Thanks.

## 2013-06-29 NOTE — Telephone Encounter (Signed)
Called and spoke to pt regarding results and recs per MR. Pt verbalized understanding and denied any further questions or concerns at this time. CT order placed.

## 2013-06-29 NOTE — Telephone Encounter (Signed)
He needs repeat ct chest wo contrast in 3 months

## 2013-06-29 NOTE — Telephone Encounter (Signed)
Pt returned call

## 2013-06-29 NOTE — Telephone Encounter (Signed)
lmtcb

## 2013-07-03 ENCOUNTER — Other Ambulatory Visit: Payer: Self-pay | Admitting: Family Medicine

## 2013-07-04 ENCOUNTER — Ambulatory Visit (INDEPENDENT_AMBULATORY_CARE_PROVIDER_SITE_OTHER): Payer: Medicare Other | Admitting: Physician Assistant

## 2013-07-04 ENCOUNTER — Encounter: Payer: Self-pay | Admitting: Physician Assistant

## 2013-07-04 VITALS — BP 120/78 | HR 68 | Temp 98.0°F | Resp 18 | Wt 208.0 lb

## 2013-07-04 DIAGNOSIS — C801 Malignant (primary) neoplasm, unspecified: Secondary | ICD-10-CM

## 2013-07-04 DIAGNOSIS — Z23 Encounter for immunization: Secondary | ICD-10-CM | POA: Diagnosis not present

## 2013-07-04 DIAGNOSIS — I2510 Atherosclerotic heart disease of native coronary artery without angina pectoris: Secondary | ICD-10-CM

## 2013-07-04 DIAGNOSIS — N390 Urinary tract infection, site not specified: Secondary | ICD-10-CM | POA: Diagnosis not present

## 2013-07-04 DIAGNOSIS — R300 Dysuria: Secondary | ICD-10-CM

## 2013-07-04 LAB — URINALYSIS, ROUTINE W REFLEX MICROSCOPIC
Bilirubin Urine: NEGATIVE
Glucose, UA: NEGATIVE mg/dL
KETONES UR: NEGATIVE mg/dL
NITRITE: NEGATIVE
PH: 6.5 (ref 5.0–8.0)
Protein, ur: NEGATIVE mg/dL
SPECIFIC GRAVITY, URINE: 1.01 (ref 1.005–1.030)
UROBILINOGEN UA: 0.2 mg/dL (ref 0.0–1.0)

## 2013-07-04 LAB — URINALYSIS, MICROSCOPIC ONLY
CRYSTALS: NONE SEEN
Casts: NONE SEEN

## 2013-07-04 MED ORDER — CIPROFLOXACIN HCL 500 MG PO TABS: 500.0000 mg | ORAL_TABLET | Freq: Two times a day (BID) | ORAL | Status: DC

## 2013-07-04 NOTE — Progress Notes (Addendum)
Patient ID: ANASTACIO BUA MRN: [id number], DOB: Aug 14, 1940, 73 y.o. Date of Encounter: @DATE @  Chief Complaint:  Chief Complaint  Patient presents with  . c/o burning with urination    constant with irritation at tip of penis, has been using left over Cipro from wife it helped but symptoms rerturned when ran out    HPI: 73 y.o. year old white male  presents with above symptoms.  Says that he has cancer of the urethra, bladder, and prostate. Says it was just diagnosed about 3 months ago. Says that "they last went in and burned tumors about 2 or 3 months ago". Says after that he "wore a catheter bag for 20 days". Says that after the catheter was taken out he experienced some burning.Was prescribed Cipro. Says then,-- again, about 4 or 5 weeks ago-- he developed some burning again. He took some Cipro that he had left over from another prescription. Burning resolved. However, says that when he came off of the Cipro, the burning returned. Says that his urologist is at [facility] as well as his oncologist. Has had no fevers or chills.   Past Medical History  Diagnosis Date  . MS (multiple sclerosis)   . Arthritis   . Diastolic dysfunction   . Right bundle branch block   . Brugada syndrome   . COPD (chronic obstructive pulmonary disease)   . GERD (gastroesophageal reflux disease)   . Depression     with bipolar tendencies  . Urinary retention with incomplete bladder emptying     receives botox injections and treatment for BPH through [facility]  . BPH (benign prostatic hyperplasia)   . Bladder cancer     [facility]  . CAD (coronary artery disease)     50% L circ, 50% RCA 1/15  . HLD (hyperlipidemia)   . HTN (hypertension)   . CHF (congestive heart failure)   . Cardiac hypertrophy      Home Meds: Outpatient Prescriptions Prior to Visit  Medication Sig Dispense Refill  . albuterol (PROVENTIL HFA;VENTOLIN HFA) 108 (90 BASE) MCG/ACT inhaler Inhale 1-2 puffs into the lungs every 6 (six) hours  as needed for wheezing or shortness of breath.  3 Inhaler  3  . amLODipine (NORVASC) 5 MG tablet Take 1 tablet (5 mg total) by mouth daily.  90 tablet  1  . aspirin EC 81 MG tablet Take 81 mg by mouth daily.       Marland Kitchen atorvastatin (LIPITOR) 20 MG tablet Take 1 tablet (20 mg total) by mouth daily.  90 tablet  1  . bisoprolol (ZEBETA) 5 MG tablet TAKE ONE-HALF (1/2) TABLET DAILY  45 tablet  3  . cyclobenzaprine (FLEXERIL) 10 MG tablet TAKE 1 TABLET BY MOUTH 3 TIMES A DAY AS NEEDED FOR MUSCLE SPASMS  30 tablet  2  . dexmethylphenidate (FOCALIN XR) 10 MG 24 hr capsule Take 10 mg by mouth daily.      . divalproex (DEPAKOTE ER) 500 MG 24 hr tablet TAKE 1 TABLET BY MOUTH AT BEDTIME  90 tablet  1  . donepezil (ARICEPT) 5 MG tablet Take 10 mg by mouth at bedtime. Take 2 tablets by mouth at bedtime.      . furosemide (LASIX) 40 MG tablet Take 1 tablet (40 mg total) by mouth 2 (two) times daily.  180 tablet  3  . losartan (COZAAR) 50 MG tablet TAKE 1 TABLET BY MOUTH EVERY DAY  90 tablet  2  . methylphenidate (METADATE CD) 10 MG  CR capsule Take 1 capsule (10 mg total) by mouth every morning.  30 capsule  0  . NAPROXEN PO Take 600 mg by mouth daily.      Marland Kitchen omeprazole (PRILOSEC) 40 MG capsule TAKE ONE CAPSULE BY MOUTH EVERY DAY  90 capsule  4  . rOPINIRole (REQUIP) 1 MG tablet Take 1 mg by mouth at bedtime.      Marland Kitchen testosterone cypionate (DEPOTESTOTERONE CYPIONATE) 200 MG/ML injection Inject 1 mL (200 mg total) into the muscle every 14 (fourteen) days.  10 mL  5  . tiotropium (SPIRIVA) 18 MCG inhalation capsule Place 18 mcg into inhaler and inhale daily. Use 2 inhalations from 1 capsule only once daily.      Marland Kitchen venlafaxine XR (EFFEXOR-XR) 150 MG 24 hr capsule Take 150 mg by mouth daily with breakfast. Take 1 capsule twice a day.      Marland Kitchen HYDROcodone-acetaminophen (NORCO) 10-325 MG per tablet Take 1 tablet by mouth every 6 (six) hours as needed.  100 tablet  0  . predniSONE (DELTASONE) 20 MG tablet 3 tabs po qd x 2  days, 2 tabs po qd x 2 days, 1 tab po qd x 2 days  12 tablet  0  . venlafaxine XR (EFFEXOR-XR) 150 MG 24 hr capsule TAKE 1 CAPSULE TWICE A DAY  180 capsule  1   No facility-administered medications prior to visit.    Allergies:  Allergies  Allergen Reactions  . Acetylcholine     unknown  . Alcohol-Sulfur [Sulfur]     unknown  . Amitriptyline     unknown  . Bupivacaine     unknown  . Clomipramine Hcl     unknown  . Cocaine     unknown  . Desipramine     unknown  . Ergonovine     unknown  . Flecainide     unknown  . Lithium     unknown  . Loxapine     unknown  . Nortriptyline     unknown  . Oxcarbazepine     unknown  . Procainamide     unknown  . Procaine     unknwon  . Propafenone     unknown  . Propofol     unknown  . Trifluoperazine     unknown    History   Social History  . Marital Status: Married    Spouse Name: N/A    Number of Children: N/A  . Years of Education: N/A   Occupational History  . retired    Social History Main Topics  . Smoking status: Former Smoker -- 2.00 packs/day for 40 years    Types: Cigarettes    Quit date: 01/04/1993  . Smokeless tobacco: Former Systems developer    Types: Chew  . Alcohol Use: Yes     Comment: 6 pack/day  . Drug Use: Not on file  . Sexual Activity: Not on file   Other Topics Concern  . Not on file   Social History Narrative  . No narrative on file    Family History  Problem Relation Age of Onset  . Stroke Mother   . Stroke Father   . Hypertension Sister   . Kidney cancer Brother   . Arthritis Brother   . Other Brother     knee problems, Bil TKR  . Hypertension      family history     Review of Systems:  See HPI for pertinent ROS. All other ROS negative.  Physical Exam: Blood pressure 120/78, pulse 68, temperature 98 F (36.7 C), temperature source Oral, resp. rate 18, weight 208 lb (94.348 kg)., Body mass index is 28.2 kg/(m^2). General: WNWD WM. Appears in no acute distress. Lungs: Clear  bilaterally to auscultation without wheezes, rales, or rhonchi. Breathing is unlabored. Heart: RRR with S1 S2. No murmurs, rubs, or gallops. Musculoskeletal:  Strength and tone normal for age. No tenderness with percussion of costophrenic angles bilaterally. GU EXAM AND DRE  DEFERRED SECONDARY TO CANCER AND RECENT TREATMENTS Extremities/Skin: Warm and dry. Neuro: Alert and oriented X 3. Moves all extremities spontaneously. Gait is normal. CNII-XII grossly in tact. Psych:  Responds to questions appropriately with a normal affect.   Results for orders placed in visit on 07/04/13  URINALYSIS, ROUTINE W REFLEX MICROSCOPIC      Result Value Ref Range   Color, Urine YELLOW  YELLOW   APPearance CLOUDY (*) CLEAR   Specific Gravity, Urine 1.010  1.005 - 1.030   pH 6.5  5.0 - 8.0   Glucose, UA NEG  NEG mg/dL   Bilirubin Urine NEG  NEG   Ketones, ur NEG  NEG mg/dL   Hgb urine dipstick SMALL (*) NEG   Protein, ur NEG  NEG mg/dL   Urobilinogen, UA 0.2  0.0 - 1.0 mg/dL   Nitrite NEG  NEG   Leukocytes, UA MODERATE  NEG  URINALYSIS, MICROSCOPIC ONLY      Result Value Ref Range   Squamous Epithelial / LPF RARE  RARE   Crystals NONE SEEN  NONE SEEN   Casts NONE SEEN  NONE SEEN   WBC, UA TNTC (*) <3 WBC/hpf   RBC / HPF 0-2  <3 RBC/hpf   Bacteria, UA MODERATE  RARE   Urine-Other MUCUS PRESENT       ASSESSMENT AND PLAN:  73 y.o. year old male with  1. Urinary tract infection without hematuria, site unspecified He is to go home and call his doctors at [facility] and let them know about the symptoms he has been experiencing and inform them of today's visit and the fact that I am sending culture and prescribed Cipro. They need to be aware that he is having recurrent infections. - ciprofloxacin (CIPRO) 500 MG tablet; Take 1 tablet (500 mg total) by mouth 2 (two) times daily.  Dispense: 30 tablet; Refill: 0 - Urine culture  2. Cancer  3. Burning with urination - Urinalysis, Routine w reflex  microscopic  4. Need for prophylactic vaccination with combined diphtheria-tetanus-pertussis (DTP) vaccine His last tetanus was greater than 10 years ago and he wants to update this today. - Tdap vaccine greater than or equal to 7yo IM   Signed, [employer], [place], [facility] 07/04/2013 11:23 AM

## 2013-07-05 ENCOUNTER — Telehealth: Payer: Self-pay | Admitting: *Deleted

## 2013-07-05 NOTE — Telephone Encounter (Signed)
Received call from patient.   Reports that he had discussed UTI treatment with [facility], and they requested labs and note to be faxed to them [phone], attn: Olivia Mackie.   Notes and labs faxed.

## 2013-07-05 NOTE — Telephone Encounter (Signed)
Agree 

## 2013-07-06 LAB — URINE CULTURE: Colony Count: 25000

## 2013-07-09 ENCOUNTER — Other Ambulatory Visit: Payer: Self-pay | Admitting: Family Medicine

## 2013-07-09 ENCOUNTER — Telehealth: Payer: Self-pay | Admitting: Cardiology

## 2013-07-09 ENCOUNTER — Other Ambulatory Visit: Payer: Self-pay | Admitting: *Deleted

## 2013-07-09 DIAGNOSIS — N390 Urinary tract infection, site not specified: Secondary | ICD-10-CM

## 2013-07-09 MED ORDER — NITROFURANTOIN MONOHYD MACRO 100 MG PO CAPS: 100.0000 mg | ORAL_CAPSULE | Freq: Two times a day (BID) | ORAL | Status: DC

## 2013-07-09 NOTE — Telephone Encounter (Signed)
Walk in pt Form " Medical Oncology Screening Form" Dropped Off [name] [name] back (7.13) will give to Her then 7.6.15/km

## 2013-07-10 DIAGNOSIS — IMO0002 Reserved for concepts with insufficient information to code with codable children: Secondary | ICD-10-CM | POA: Diagnosis not present

## 2013-07-10 DIAGNOSIS — C680 Malignant neoplasm of urethra: Secondary | ICD-10-CM | POA: Diagnosis not present

## 2013-07-13 ENCOUNTER — Ambulatory Visit: Payer: Medicare Other | Admitting: Adult Health

## 2013-07-19 DIAGNOSIS — I700 Atherosclerosis of aorta: Secondary | ICD-10-CM | POA: Diagnosis not present

## 2013-07-19 DIAGNOSIS — R918 Other nonspecific abnormal finding of lung field: Secondary | ICD-10-CM | POA: Diagnosis not present

## 2013-07-19 DIAGNOSIS — I2510 Atherosclerotic heart disease of native coronary artery without angina pectoris: Secondary | ICD-10-CM | POA: Diagnosis not present

## 2013-07-19 DIAGNOSIS — C680 Malignant neoplasm of urethra: Secondary | ICD-10-CM | POA: Diagnosis not present

## 2013-07-20 DIAGNOSIS — G35 Multiple sclerosis: Secondary | ICD-10-CM | POA: Diagnosis not present

## 2013-07-20 DIAGNOSIS — J449 Chronic obstructive pulmonary disease, unspecified: Secondary | ICD-10-CM | POA: Diagnosis not present

## 2013-07-20 DIAGNOSIS — C680 Malignant neoplasm of urethra: Secondary | ICD-10-CM | POA: Diagnosis not present

## 2013-07-20 DIAGNOSIS — K219 Gastro-esophageal reflux disease without esophagitis: Secondary | ICD-10-CM | POA: Diagnosis not present

## 2013-07-20 DIAGNOSIS — I509 Heart failure, unspecified: Secondary | ICD-10-CM | POA: Diagnosis not present

## 2013-07-20 DIAGNOSIS — C679 Malignant neoplasm of bladder, unspecified: Secondary | ICD-10-CM | POA: Diagnosis not present

## 2013-07-20 DIAGNOSIS — I2510 Atherosclerotic heart disease of native coronary artery without angina pectoris: Secondary | ICD-10-CM | POA: Diagnosis not present

## 2013-07-20 DIAGNOSIS — Z79899 Other long term (current) drug therapy: Secondary | ICD-10-CM | POA: Diagnosis not present

## 2013-07-20 DIAGNOSIS — N3080 Other cystitis without hematuria: Secondary | ICD-10-CM | POA: Diagnosis not present

## 2013-07-20 DIAGNOSIS — I10 Essential (primary) hypertension: Secondary | ICD-10-CM | POA: Diagnosis not present

## 2013-07-20 DIAGNOSIS — N529 Male erectile dysfunction, unspecified: Secondary | ICD-10-CM | POA: Diagnosis not present

## 2013-07-20 DIAGNOSIS — N319 Neuromuscular dysfunction of bladder, unspecified: Secondary | ICD-10-CM | POA: Diagnosis not present

## 2013-07-20 DIAGNOSIS — E785 Hyperlipidemia, unspecified: Secondary | ICD-10-CM | POA: Diagnosis not present

## 2013-07-24 ENCOUNTER — Ambulatory Visit: Payer: Medicare Other | Admitting: Adult Health

## 2013-08-02 DIAGNOSIS — C679 Malignant neoplasm of bladder, unspecified: Secondary | ICD-10-CM | POA: Diagnosis not present

## 2013-08-04 ENCOUNTER — Other Ambulatory Visit: Payer: Self-pay | Admitting: Family Medicine

## 2013-08-05 ENCOUNTER — Other Ambulatory Visit: Payer: Self-pay | Admitting: Family Medicine

## 2013-08-16 ENCOUNTER — Other Ambulatory Visit: Payer: Self-pay | Admitting: Cardiology

## 2013-08-23 ENCOUNTER — Other Ambulatory Visit: Payer: Self-pay | Admitting: Cardiology

## 2013-08-24 ENCOUNTER — Ambulatory Visit: Payer: Medicare Other | Admitting: Cardiology

## 2013-09-04 ENCOUNTER — Ambulatory Visit (INDEPENDENT_AMBULATORY_CARE_PROVIDER_SITE_OTHER): Payer: Medicare Other | Admitting: Adult Health

## 2013-09-04 ENCOUNTER — Ambulatory Visit (INDEPENDENT_AMBULATORY_CARE_PROVIDER_SITE_OTHER): Payer: Medicare Other | Admitting: Internal Medicine

## 2013-09-04 ENCOUNTER — Encounter: Payer: Self-pay | Admitting: Adult Health

## 2013-09-04 VITALS — BP 124/66 | HR 62 | Temp 97.7°F | Ht 69.5 in | Wt 207.0 lb

## 2013-09-04 DIAGNOSIS — R0989 Other specified symptoms and signs involving the circulatory and respiratory systems: Secondary | ICD-10-CM | POA: Diagnosis not present

## 2013-09-04 DIAGNOSIS — Z23 Encounter for immunization: Secondary | ICD-10-CM | POA: Diagnosis not present

## 2013-09-04 DIAGNOSIS — R0609 Other forms of dyspnea: Secondary | ICD-10-CM

## 2013-09-04 DIAGNOSIS — R911 Solitary pulmonary nodule: Secondary | ICD-10-CM

## 2013-09-04 DIAGNOSIS — R0689 Other abnormalities of breathing: Principal | ICD-10-CM

## 2013-09-04 DIAGNOSIS — R0600 Dyspnea, unspecified: Secondary | ICD-10-CM

## 2013-09-04 DIAGNOSIS — J438 Other emphysema: Secondary | ICD-10-CM | POA: Diagnosis not present

## 2013-09-04 DIAGNOSIS — J439 Emphysema, unspecified: Secondary | ICD-10-CM

## 2013-09-04 DIAGNOSIS — J449 Chronic obstructive pulmonary disease, unspecified: Secondary | ICD-10-CM | POA: Insufficient documentation

## 2013-09-04 LAB — PULMONARY FUNCTION TEST
DL/VA % pred: 76 %
DL/VA: 3.48 ml/min/mmHg/L
DLCO UNC % PRED: 66 %
DLCO unc: 21.06 ml/min/mmHg
FEF 25-75 POST: 1.67 L/s
FEF 25-75 PRE: 1.25 L/s
FEF2575-%Change-Post: 33 %
FEF2575-%PRED-POST: 71 %
FEF2575-%PRED-PRE: 53 %
FEV1-%Change-Post: 6 %
FEV1-%Pred-Post: 77 %
FEV1-%Pred-Pre: 72 %
FEV1-PRE: 2.27 L
FEV1-Post: 2.42 L
FEV1FVC-%Change-Post: 5 %
FEV1FVC-%PRED-PRE: 93 %
FEV6-%CHANGE-POST: 1 %
FEV6-%PRED-POST: 82 %
FEV6-%Pred-Pre: 81 %
FEV6-POST: 3.32 L
FEV6-Pre: 3.26 L
FEV6FVC-%CHANGE-POST: 0 %
FEV6FVC-%Pred-Post: 105 %
FEV6FVC-%Pred-Pre: 104 %
FVC-%Change-Post: 1 %
FVC-%PRED-POST: 78 %
FVC-%PRED-PRE: 77 %
FVC-POST: 3.36 L
FVC-Pre: 3.32 L
PRE FEV1/FVC RATIO: 68 %
Post FEV1/FVC ratio: 72 %
Post FEV6/FVC ratio: 99 %
Pre FEV6/FVC Ratio: 98 %
RV % pred: 106 %
RV: 2.62 L
TLC % pred: 90 %
TLC: 6.31 L

## 2013-09-04 NOTE — Patient Instructions (Signed)
Continue on Spiriva daily  Flu shot today.  Follow up CT chest for lung nodule later this month.  Follow up Dr. Chase Caller in 3 months and As needed

## 2013-09-04 NOTE — Assessment & Plan Note (Signed)
Repeat CT chest later this month to follow nodule/nodularity on right

## 2013-09-04 NOTE — Assessment & Plan Note (Addendum)
Mild COPD vs Asthma in former smoker  PFT show minimal obstruction w/ no obstruction s/p BD  PFT 09/04/2013  FEV1 72%, ratio 68, FVC 77%, no sign BD response, decreased diffusing capacity at 66%.  Decreased Mid flows 53% w/ +BD response.  CT chest w/ no evidence of ILD  Will hold on adding ICS or combo for now as he does not have evidence of bronchospasm or active cough/wheezing.  Suspect symptoms of dyspnea are multifactoral in nature with multiple comorbidities and deconditioning.   Plan  Continue on Spiriva daily  Flu shot today.  Follow up CT chest for lung nodule later this month.  Follow up Dr. Chase Caller in 3 months and As needed

## 2013-09-04 NOTE — Progress Notes (Signed)
Subjective:    Patient ID: [name] [name], male    DOB: [birth year], 73 y.o.   MRN: [id number]  HPI    OV 06/22/2013  Chief Complaint  Patient presents with  . Pulmonary Consult    HFU per [name]  for COPD and SOB.    Dyspnea and COPD referral from Dr Aundra Dubin  73 year old male. Use to run 5-20 miles each day till 10-20 years ago.HE is ex smoker 80 pack; quit 20 years ago followed by weight gain. THen several health issues including MS and bladder cancer and diastolic cHF.  Past few years insidious onset dyspnea, progresive, moderate in intensity with some asocited cough maybe. Brought on for climbing 1 flight of stairs and relieved by rest. THere is currently no associatd orthopnea, edema, wheeze  Dyspnea relevant hx    Exertional dyspnea: Lexiscan myoview (8/09) with EF 57%, diaphragmatic attenuation but no ischemia or infarction. Lexiscan myoview (3/11): EF 68% with fixed inferior defect likely due to diaphragmatic attenuation and no ischemia (similar to 2009). Echo (4/11): EF 25-95%, mild diastolic dysfunction, moderate mitral regurgitation, mild pulmonary HTN (PASP 38 mmHg), normal RV size and systolic function. PFTs (3/11) with moderate obstructive defect. Echo (12/14) with EF 50-55%, inferior and apical septal hypokinesis, mild LVH, mild mitral regurgitation.   CAD: LHC (1/15) with serial 50% and 60% mid LCx stenoses, 50% proximal RCA stenosis, EF 55% on LV-gram, LVEDP 7 mmHg.    COPD noted: Prior heavy smoker. PFTs (3/11): FVC 87%, FEV1 73%, ratio 0.57, DLCO 75%, TLC 121%. Moderate obstructive defect.  - per DR Aundra Dubin note. RX spiriva with some help    CXR 12/06/12: ? Chronic ILD changes - my interpretation   Walking desaturation test today in the office June 22 2013:180 feet x3 laps on room air: Dropped to 89% at the end of the third lap but promptly improved to 92% when he rested. He did not have any complaints   09/04/2013 Follow up COPD and test results  Pt  returns for follow up and to review recent test results.  Pt says overall he is doing well. No flare in cough or dyspnea.  He had a CT chest done 06/2013 that was neg for ILD . RML ndoularity and 6 mm nodule noted. A CT chest has been set up for later this month. Mild emphysema and bronchial wall thickening.  PFT today showed FEV1 72%, ratio 68, FVC 77%, no sign BD response, decreased diffusing capacity at 66%.  Decreased Mid flows 53% w/ +BD response.  On Spiriva daily  Denies significant cough or wheezing .  No chest pain,orthopnea, edema or fever.  Was a heavy smoker for many years.      Past Medical History  Diagnosis Date  . MS (multiple sclerosis)   . Arthritis   . Diastolic dysfunction   . Right bundle branch block   . Brugada syndrome   . COPD (chronic obstructive pulmonary disease)   . GERD (gastroesophageal reflux disease)   . Depression     with bipolar tendencies  . Urinary retention with incomplete bladder emptying     receives botox injections and treatment for BPH through [facility]  . BPH (benign prostatic hyperplasia)   . Bladder cancer     [facility]  . CAD (coronary artery disease)     50% L circ, 50% RCA 1/15  . HLD (hyperlipidemia)   . HTN (hypertension)   . CHF (congestive heart failure)   . Cardiac  hypertrophy       Review of Systems  Constitutional: Negative for fever and unexpected weight change.  HENT: Negative for congestion, dental problem, ear pain, nosebleeds, postnasal drip, rhinorrhea, sinus pressure, sneezing, sore throat and trouble swallowing.   Eyes: Negative for redness and itching.  Respiratory: Positive for shortness of breath. Negative for cough, chest tightness and wheezing.   Cardiovascular: Negative for palpitations and leg swelling.  Gastrointestinal: Negative for nausea and vomiting.  Genitourinary: Negative for dysuria.  Musculoskeletal: Negative for joint swelling.  Skin: Negative for rash.  Neurological: Negative for headaches.    Hematological: Does not bruise/bleed easily.  Psychiatric/Behavioral: Negative for dysphoric mood. The patient is not nervous/anxious.        Objective:   Physical Exam  Nursing note and vitals reviewed. Constitutional: He is oriented to person, place, and time. He appears well-developed and well-nourished. No distress.  HENT:  Head: Normocephalic and atraumatic.  Right Ear: External ear normal.  Left Ear: External ear normal.  Mouth/Throat: Oropharynx is clear and moist. No oropharyngeal exudate.  Hearing aid  Eyes: Conjunctivae and EOM are normal. Pupils are equal, round, and reactive to light. Right eye exhibits no discharge. Left eye exhibits no discharge. No scleral icterus.  Neck: Normal range of motion. Neck supple. No JVD present. No tracheal deviation present. No thyromegaly present.  Cardiovascular: Normal rate, regular rhythm and intact distal pulses.  Exam reveals no gallop and no friction rub.   No murmur heard. Pulmonary/Chest: Effort normal and breath sounds normal. No respiratory distress. He has no wheezes. He has no rales. He exhibits no tenderness.  Abdominal: Soft. Bowel sounds are normal. He exhibits no distension and no mass. There is no tenderness. There is no rebound and no guarding.  Musculoskeletal: Normal range of motion. He exhibits no edema and no tenderness.  Lymphadenopathy:    He has no cervical adenopathy.  Neurological: He is alert and oriented to person, place, and time. He has normal reflexes. No cranial nerve deficit. Coordination normal.  Skin: Skin is warm and dry. No rash noted. He is not diaphoretic. No erythema. No pallor.  Psychiatric: He has a normal mood and affect. His behavior is normal. Judgment and thought content normal.          Assessment & Plan:

## 2013-09-04 NOTE — Progress Notes (Signed)
PFT done today. 

## 2013-09-21 ENCOUNTER — Encounter: Payer: Self-pay | Admitting: Family Medicine

## 2013-09-21 ENCOUNTER — Ambulatory Visit (INDEPENDENT_AMBULATORY_CARE_PROVIDER_SITE_OTHER): Payer: Medicare Other | Admitting: Family Medicine

## 2013-09-21 VITALS — BP 140/74 | HR 78 | Temp 98.5°F | Resp 18 | Ht 71.0 in | Wt 205.0 lb

## 2013-09-21 DIAGNOSIS — F32A Depression, unspecified: Secondary | ICD-10-CM

## 2013-09-21 DIAGNOSIS — F3289 Other specified depressive episodes: Secondary | ICD-10-CM | POA: Diagnosis not present

## 2013-09-21 DIAGNOSIS — F329 Major depressive disorder, single episode, unspecified: Secondary | ICD-10-CM

## 2013-09-21 DIAGNOSIS — I2510 Atherosclerotic heart disease of native coronary artery without angina pectoris: Secondary | ICD-10-CM | POA: Diagnosis not present

## 2013-09-21 NOTE — Progress Notes (Signed)
Subjective:    Patient ID: [name] [name], male    DOB: 1940/01/29, 73 y.o.   MRN: [id number]  HPI Patient has a history of depression with bipolar tendencies. He is been on venlafaxine extended release 300 mg for quite some time. Over the last 2-3 months he has developed worsening depression. He also reports decreased energy and anhedonia. He denies any suicidal ideation. He traces the depression back to his ongoing medical problems including bladder cancer, urethral cancer, multiple sclerosis, and congestive heart failure. He is also frustrated by his inability to physically perform activities like he once could.  His wife also reports he is having increased anger outbursts. Patient states that when he takes a pain pill Valium, he does have more energy. He states that he may be feeling some from the pain medication which may be helping to treat his depression. Past Medical History  Diagnosis Date  . MS (multiple sclerosis)   . Arthritis   . Diastolic dysfunction   . Right bundle branch block   . Brugada syndrome   . COPD (chronic obstructive pulmonary disease)   . GERD (gastroesophageal reflux disease)   . Depression     with bipolar tendencies  . Urinary retention with incomplete bladder emptying     receives botox injections and treatment for BPH through [facility]  . BPH (benign prostatic hyperplasia)   . Bladder cancer     [facility]  . CAD (coronary artery disease)     50% L circ, 50% RCA 1/15  . HLD (hyperlipidemia)   . HTN (hypertension)   . CHF (congestive heart failure)   . Cardiac hypertrophy    Past Surgical History  Procedure Laterality Date  . Total knee arthroplasty Right   . Transurethral resection of prostate    . Tonsillectomy    . Appendectomy    . Rotator cuff repair    . Hemorrhoid surgery     Current Outpatient Prescriptions on File Prior to Visit  Medication Sig Dispense Refill  . albuterol (PROVENTIL HFA;VENTOLIN HFA) 108 (90 BASE) MCG/ACT inhaler Inhale 1-2  puffs into the lungs every 6 (six) hours as needed for wheezing or shortness of breath.  3 Inhaler  3  . amLODipine (NORVASC) 5 MG tablet TAKE 1 TABLET DAILY  90 tablet  0  . aspirin EC 81 MG tablet Take 81 mg by mouth daily.       Marland Kitchen atorvastatin (LIPITOR) 20 MG tablet TAKE 1 TABLET DAILY  90 tablet  0  . bisoprolol (ZEBETA) 5 MG tablet TAKE ONE-HALF (1/2) TABLET DAILY  45 tablet  3  . cyclobenzaprine (FLEXERIL) 10 MG tablet TAKE 1 TABLET BY MOUTH 3 TIMES A DAY AS NEEDED FOR MUSCLE SPASMS  30 tablet  2  . dexmethylphenidate (FOCALIN XR) 10 MG 24 hr capsule Take 10 mg by mouth daily.      . divalproex (DEPAKOTE ER) 500 MG 24 hr tablet TAKE 1 TABLET BY MOUTH AT BEDTIME  90 tablet  1  . donepezil (ARICEPT) 5 MG tablet Take 2 tablets by mouth at bedtime.      . furosemide (LASIX) 40 MG tablet Take 1 tablet (40 mg total) by mouth 2 (two) times daily.  180 tablet  3  . HYDROcodone-acetaminophen (NORCO) 10-325 MG per tablet Take 1 tablet by mouth every 6 (six) hours as needed.  100 tablet  0  . losartan (COZAAR) 50 MG tablet TAKE 1 TABLET DAILY  90 tablet  3  . methylphenidate (  METADATE CD) 10 MG CR capsule Take 1 capsule (10 mg total) by mouth every morning.  30 capsule  0  . omeprazole (PRILOSEC) 40 MG capsule TAKE ONE CAPSULE BY MOUTH EVERY DAY  90 capsule  4  . rOPINIRole (REQUIP) 1 MG tablet Take 1 mg by mouth at bedtime.      Marland Kitchen SPIRIVA HANDIHALER 18 MCG inhalation capsule INHALE THE CONTENTS OF 1 CAPSULE WITH 2         INHALATIONS ONCE DAILY AS DIRECTED  1 capsule  11  . testosterone cypionate (DEPOTESTOTERONE CYPIONATE) 200 MG/ML injection Inject 1 mL (200 mg total) into the muscle every 14 (fourteen) days.  10 mL  5  . venlafaxine XR (EFFEXOR-XR) 150 MG 24 hr capsule Take 1 capsule twice a day.       No current facility-administered medications on file prior to visit.   Allergies  Allergen Reactions  . Acetylcholine     unknown  . Alcohol-Sulfur [Sulfur]     unknown  . Amitriptyline      unknown  . Bupivacaine     unknown  . Clomipramine Hcl     unknown  . Cocaine     unknown  . Desipramine     unknown  . Ergonovine     unknown  . Flecainide     unknown  . Lithium     unknown  . Loxapine     unknown  . Nortriptyline     unknown  . Oxcarbazepine     unknown  . Procainamide     unknown  . Procaine     unknwon  . Propafenone     unknown  . Propofol     unknown  . Trifluoperazine     unknown   History   Social History  . Marital Status: Married    Spouse Name: N/A    Number of Children: N/A  . Years of Education: N/A   Occupational History  . retired    Social History Main Topics  . Smoking status: Former Smoker -- 2.00 packs/day for 40 years    Types: Cigarettes    Quit date: 01/04/1993  . Smokeless tobacco: Former Systems developer    Types: Chew  . Alcohol Use: Yes     Comment: 6 pack/day  . Drug Use: Not on file  . Sexual Activity: Not on file   Other Topics Concern  . Not on file   Social History Narrative  . No narrative on file      Review of Systems  All other systems reviewed and are negative.      Objective:   Physical Exam  Vitals reviewed. Constitutional: He appears well-developed and well-nourished.  Cardiovascular: Normal rate, regular rhythm and normal heart sounds.   No murmur heard. Pulmonary/Chest: Effort normal and breath sounds normal. No respiratory distress. He has no wheezes. He has no rales. He exhibits no tenderness.          Assessment & Plan:  Depression   Decreased venlafaxine to 150 mg by mouth every morning for 2 weeks and then discontinue the medicine altogether. Meanwhile begin Brintellix 5 mg poqam and increase to 10 mg poqam in 2 weeks.  Recheck in 6 weeks.

## 2013-09-24 DIAGNOSIS — G35 Multiple sclerosis: Secondary | ICD-10-CM | POA: Diagnosis not present

## 2013-09-24 DIAGNOSIS — Z8669 Personal history of other diseases of the nervous system and sense organs: Secondary | ICD-10-CM | POA: Diagnosis not present

## 2013-09-25 ENCOUNTER — Telehealth: Payer: Self-pay | Admitting: Family Medicine

## 2013-09-25 ENCOUNTER — Encounter: Payer: Self-pay | Admitting: Family Medicine

## 2013-09-25 DIAGNOSIS — C679 Malignant neoplasm of bladder, unspecified: Secondary | ICD-10-CM | POA: Insufficient documentation

## 2013-09-25 NOTE — Telephone Encounter (Signed)
[phone]  Pt is needing a refill on his zofran to help with his throwing up and his vailum to help with his dizziness

## 2013-09-26 NOTE — Telephone Encounter (Signed)
Wife states both Rx's have expired.  Have not had in long time.  Is having the dizziness, Antivert in past has not worked.  Has occ nausea and vomiting and would like Zofran.

## 2013-09-27 ENCOUNTER — Ambulatory Visit (INDEPENDENT_AMBULATORY_CARE_PROVIDER_SITE_OTHER)
Admission: RE | Admit: 2013-09-27 | Discharge: 2013-09-27 | Disposition: A | Discharge status: Home / Self Care | Payer: Medicare Other | Source: Ambulatory Visit | Attending: Internal Medicine | Admitting: Internal Medicine

## 2013-09-27 DIAGNOSIS — J449 Chronic obstructive pulmonary disease, unspecified: Secondary | ICD-10-CM | POA: Diagnosis not present

## 2013-09-27 DIAGNOSIS — R0989 Other specified symptoms and signs involving the circulatory and respiratory systems: Secondary | ICD-10-CM

## 2013-09-27 DIAGNOSIS — R0609 Other forms of dyspnea: Secondary | ICD-10-CM

## 2013-09-27 DIAGNOSIS — R911 Solitary pulmonary nodule: Secondary | ICD-10-CM | POA: Diagnosis not present

## 2013-09-27 DIAGNOSIS — R0600 Dyspnea, unspecified: Secondary | ICD-10-CM

## 2013-09-27 NOTE — Telephone Encounter (Signed)
Message copied by Olena Mater on Thu Sep 27, 2013  7:51 AM ------      Message from: [name] [name]      Created: Thu Sep 27, 2013  7:07 AM       Patient can have zofran 4 mg poq 8 hrs prn nausea.  Can also try zofran also for dizziness ------

## 2013-09-28 ENCOUNTER — Telehealth: Payer: Self-pay | Admitting: Family Medicine

## 2013-09-28 MED ORDER — ONDANSETRON HCL 4 MG PO TABS: 4.0000 mg | ORAL_TABLET | Freq: Three times a day (TID) | ORAL | Status: DC | PRN

## 2013-09-28 NOTE — Telephone Encounter (Signed)
Spoke to wife about Rx for Zofran and Rx to pharmacy

## 2013-09-28 NOTE — Telephone Encounter (Signed)
Patient is calling to say that he appreciates [place] calling [facility], but it really needed to go [facility] please call him back at [phone]

## 2013-09-28 NOTE — Telephone Encounter (Signed)
Zofran 4 mg poq 8 hrs prn.  Its in original message.

## 2013-09-28 NOTE — Telephone Encounter (Signed)
Pt called.  Med is PRN and OK to get locally

## 2013-09-30 ENCOUNTER — Other Ambulatory Visit: Payer: Self-pay | Admitting: Family Medicine

## 2013-10-09 NOTE — Progress Notes (Signed)
Quick Note:  ATC pt. No option for vm. WCB. ______

## 2013-10-10 ENCOUNTER — Telehealth: Payer: Self-pay | Admitting: Internal Medicine

## 2013-10-10 NOTE — Telephone Encounter (Signed)
Result Note     Ct chest 09/27/13 show stability in lung nodule compared to June 2015. Continued surveillance advised  -----  I spoke with patient about results and he verbalized understanding and had no questions

## 2013-10-11 ENCOUNTER — Telehealth: Payer: Self-pay | Admitting: Family Medicine

## 2013-10-11 NOTE — Telephone Encounter (Signed)
?   OK to Refill  

## 2013-10-11 NOTE — Telephone Encounter (Signed)
Patient requesting rx for his hydrocodone  [phone]

## 2013-10-12 MED ORDER — HYDROCODONE-ACETAMINOPHEN 10-325 MG PO TABS: 1.0000 | ORAL_TABLET | Freq: Four times a day (QID) | ORAL | Status: DC | PRN

## 2013-10-12 NOTE — Telephone Encounter (Signed)
RX printed, left up front and patient aware to pick up per vm 

## 2013-10-12 NOTE — Telephone Encounter (Signed)
ok 

## 2013-10-16 ENCOUNTER — Other Ambulatory Visit: Payer: Self-pay | Admitting: Family Medicine

## 2013-10-23 ENCOUNTER — Ambulatory Visit (INDEPENDENT_AMBULATORY_CARE_PROVIDER_SITE_OTHER): Payer: Medicare Other | Admitting: Family Medicine

## 2013-10-23 ENCOUNTER — Encounter: Payer: Self-pay | Admitting: Family Medicine

## 2013-10-23 VITALS — BP 126/68 | HR 78 | Temp 98.0°F | Resp 18 | Ht 71.0 in | Wt 208.0 lb

## 2013-10-23 DIAGNOSIS — I2510 Atherosclerotic heart disease of native coronary artery without angina pectoris: Secondary | ICD-10-CM | POA: Diagnosis not present

## 2013-10-23 DIAGNOSIS — F3130 Bipolar disorder, current episode depressed, mild or moderate severity, unspecified: Secondary | ICD-10-CM

## 2013-10-23 MED ORDER — ARIPIPRAZOLE 5 MG PO TABS: 5.0000 mg | ORAL_TABLET | Freq: Every day | ORAL | Status: DC

## 2013-10-23 NOTE — Progress Notes (Signed)
Subjective:    Patient ID: [name] [name], male    DOB: Jul 21, 1940, 73 y.o.   MRN: [id number]  HPI  When I last saw the patient, I had him discontinue Effexor and replaced it with Brintellix for severe depression. The patient has a history of depression with bipolar tendencies. I have been using Depakote as a mood stabilizer. Since making this change, the patient's wife reports that the patient has become increasingly violent. He was rapidly losing his temper over minor issues.  He also perseverates and will not allow any future rest. His wife is contemplating a divorce. He tends to have lots of mood swings, poorly controlled age, and violent outbursts. He has not been physically abusive but she is concerned he may be. He admits that he is having issues with mood swings and mood control. He also reports severe depression. He finds it difficult to even get out of bed in the morning. He feels unmotivated. He denies any suicidal ideation. Past Medical History  Diagnosis Date  . MS (multiple sclerosis)   . Arthritis   . Diastolic dysfunction   . Right bundle branch block   . Brugada syndrome   . COPD (chronic obstructive pulmonary disease)   . GERD (gastroesophageal reflux disease)   . Depression     with bipolar tendencies  . Urinary retention with incomplete bladder emptying     receives botox injections and treatment for BPH through [facility]  . BPH (benign prostatic hyperplasia)   . Bladder cancer     [facility], Ta low grade papillary urothileal carcinoma  . CAD (coronary artery disease)     50% L circ, 50% RCA 1/15  . HLD (hyperlipidemia)   . HTN (hypertension)   . CHF (congestive heart failure)   . Cardiac hypertrophy    Past Surgical History  Procedure Laterality Date  . Total knee arthroplasty Right   . Transurethral resection of prostate    . Tonsillectomy    . Appendectomy    . Rotator cuff repair    . Hemorrhoid surgery     Current Outpatient Prescriptions on File Prior to  Visit  Medication Sig Dispense Refill  . amLODipine (NORVASC) 5 MG tablet TAKE 1 TABLET DAILY  90 tablet  0  . aspirin EC 81 MG tablet Take 81 mg by mouth daily.       Marland Kitchen atorvastatin (LIPITOR) 20 MG tablet TAKE 1 TABLET DAILY  90 tablet  0  . bisoprolol (ZEBETA) 5 MG tablet TAKE ONE-HALF (1/2) TABLET DAILY  45 tablet  3  . cyclobenzaprine (FLEXERIL) 10 MG tablet TAKE 1 TABLET BY MOUTH 3 TIMES A DAY AS NEEDED FOR MUSCLE SPASMS  30 tablet  2  . dexmethylphenidate (FOCALIN XR) 10 MG 24 hr capsule Take 10 mg by mouth daily.      . divalproex (DEPAKOTE ER) 500 MG 24 hr tablet TAKE 1 TABLET AT BEDTIME  90 tablet  1  . donepezil (ARICEPT) 5 MG tablet Take 2 tablets by mouth at bedtime.      . furosemide (LASIX) 40 MG tablet Take 1 tablet (40 mg total) by mouth 2 (two) times daily.  180 tablet  3  . HYDROcodone-acetaminophen (NORCO) 10-325 MG per tablet Take 1 tablet by mouth every 6 (six) hours as needed.  100 tablet  0  . losartan (COZAAR) 50 MG tablet TAKE 1 TABLET DAILY  90 tablet  3  . methylphenidate (METADATE CD) 10 MG CR capsule Take 1 capsule (10  mg total) by mouth every morning.  30 capsule  0  . NAPROXEN PO Take 600 mg by mouth daily.      Marland Kitchen omeprazole (PRILOSEC) 40 MG capsule TAKE ONE CAPSULE BY MOUTH EVERY DAY  90 capsule  4  . ondansetron (ZOFRAN) 4 MG tablet Take 1 tablet (4 mg total) by mouth every 8 (eight) hours as needed for nausea or vomiting.  20 tablet  1  . PROAIR HFA 108 (90 BASE) MCG/ACT inhaler INHALE 1 TO 2 PUFFS EVERY 6 HOURS AS NEEDED FOR WHEEZING OR SHORTNESS OF BREATH  3 each  5  . rOPINIRole (REQUIP) 1 MG tablet Take 1 mg by mouth at bedtime.      Marland Kitchen SPIRIVA HANDIHALER 18 MCG inhalation capsule INHALE THE CONTENTS OF 1 CAPSULE WITH 2         INHALATIONS ONCE DAILY AS DIRECTED  1 capsule  11  . testosterone cypionate (DEPOTESTOTERONE CYPIONATE) 200 MG/ML injection Inject 1 mL (200 mg total) into the muscle every 14 (fourteen) days.  10 mL  5  . venlafaxine XR (EFFEXOR-XR)  150 MG 24 hr capsule Take 1 capsule twice a day.       No current facility-administered medications on file prior to visit.   Allergies  Allergen Reactions  . Acetylcholine     unknown  . Alcohol-Sulfur [Sulfur]     unknown  . Amitriptyline     unknown  . Bupivacaine     unknown  . Clomipramine Hcl     unknown  . Cocaine     unknown  . Desipramine     unknown  . Ergonovine     unknown  . Flecainide     unknown  . Lithium     unknown  . Loxapine     unknown  . Nortriptyline     unknown  . Oxcarbazepine     unknown  . Procainamide     unknown  . Procaine     unknwon  . Propafenone     unknown  . Propofol     unknown  . Trifluoperazine     unknown   History   Social History  . Marital Status: Married    Spouse Name: N/A    Number of Children: N/A  . Years of Education: N/A   Occupational History  . retired    Social History Main Topics  . Smoking status: Former Smoker -- 2.00 packs/day for 40 years    Types: Cigarettes    Quit date: 01/04/1993  . Smokeless tobacco: Former Systems developer    Types: Chew  . Alcohol Use: Yes     Comment: 6 pack/day  . Drug Use: Not on file  . Sexual Activity: Not on file   Other Topics Concern  . Not on file   Social History Narrative  . No narrative on file   Family History  Problem Relation Age of Onset  . Stroke Mother   . Stroke Father   . Hypertension Sister   . Kidney cancer Brother   . Arthritis Brother   . Other Brother     knee problems, Bil TKR  . Hypertension      family history     Review of Systems  All other systems reviewed and are negative.      Objective:   Physical Exam  Vitals reviewed. Cardiovascular: Normal rate, regular rhythm and normal heart sounds.   Pulmonary/Chest: Effort normal and breath sounds normal.  Psychiatric: He  has a normal mood and affect. His behavior is normal. Judgment and thought content normal.          Assessment & Plan:  Bipolar disorder, most recent  episode depressed, remission status unspecified - Plan: ARIPiprazole (ABILIFY) 5 MG tablet  Discontinue brintellix and depakote.  Resume venlafaxine 150 mg by mouth daily extended release. Begin abilify 5 mg poqhs and recheck in one month.

## 2013-10-25 DIAGNOSIS — C4491 Basal cell carcinoma of skin, unspecified: Secondary | ICD-10-CM | POA: Diagnosis not present

## 2013-10-25 DIAGNOSIS — D485 Neoplasm of uncertain behavior of skin: Secondary | ICD-10-CM | POA: Diagnosis not present

## 2013-10-27 ENCOUNTER — Other Ambulatory Visit: Payer: Self-pay | Admitting: Cardiology

## 2013-11-07 ENCOUNTER — Other Ambulatory Visit: Payer: Self-pay | Admitting: Cardiology

## 2013-11-07 MED ORDER — ATORVASTATIN CALCIUM 20 MG PO TABS: ORAL_TABLET | ORAL | Status: DC

## 2013-11-15 ENCOUNTER — Telehealth: Payer: Self-pay | Admitting: Family Medicine

## 2013-11-15 NOTE — Telephone Encounter (Signed)
[phone]  Pt wife is calling because pt is needing a 90 day supply of  ARIPiprazole (ABILIFY) 5 MG tablet  [facility]    PT wife is also having questions to wheather pt was suppose to stop taking the divalproex (DEPAKOTE ER) 500 MG 24 hr tablet   Pt wife is also wanting to talk to you about the valium he was taken a couple years ago too, something about how it gives him energy.

## 2013-11-16 NOTE — Telephone Encounter (Signed)
Given the chronicity of his use, I would not recommend the abrupt discontinuation of hydrocodone. I would like him to try to wean himself away from the medication gradually. Valium is not a pain medication that would help with arthritis. Therefore I do not believe that this would be a good alternative for pain control due to his severe arthritis

## 2013-11-16 NOTE — Telephone Encounter (Signed)
Pt's wife wanted you to know that pt is much better on Abilify. He would like to stop the pain medication and start taking the valium as it seems to give him more energy then the pain medication does and would like to switch. (pt did stop the depakote).

## 2013-11-18 ENCOUNTER — Other Ambulatory Visit: Payer: Self-pay | Admitting: Family Medicine

## 2013-11-20 NOTE — Telephone Encounter (Signed)
Tried to call no answer and no vm 

## 2013-11-26 ENCOUNTER — Other Ambulatory Visit: Payer: Self-pay | Admitting: Cardiology

## 2013-12-03 ENCOUNTER — Encounter: Payer: Self-pay | Admitting: Adult Health

## 2013-12-03 ENCOUNTER — Ambulatory Visit (INDEPENDENT_AMBULATORY_CARE_PROVIDER_SITE_OTHER): Payer: Medicare Other | Admitting: Adult Health

## 2013-12-03 VITALS — BP 132/80 | HR 51 | Temp 97.1°F | Ht 72.0 in | Wt 219.0 lb

## 2013-12-03 DIAGNOSIS — I2510 Atherosclerotic heart disease of native coronary artery without angina pectoris: Secondary | ICD-10-CM

## 2013-12-03 DIAGNOSIS — J439 Emphysema, unspecified: Secondary | ICD-10-CM

## 2013-12-03 DIAGNOSIS — R911 Solitary pulmonary nodule: Secondary | ICD-10-CM

## 2013-12-03 DIAGNOSIS — I5032 Chronic diastolic (congestive) heart failure: Secondary | ICD-10-CM

## 2013-12-03 NOTE — Assessment & Plan Note (Signed)
Compensated without flare. Continue on present regimen

## 2013-12-03 NOTE — Patient Instructions (Signed)
Continue on Spiriva daily  We are setting you up for a PET scan . I will call with the results  Follow up Dr. Chase Caller in 3 months and As needed

## 2013-12-03 NOTE — Telephone Encounter (Signed)
Spoke to pts wife and informed her of the below and recommended that if he had any questions then he should schedule an appt.

## 2013-12-03 NOTE — Assessment & Plan Note (Signed)
Compensated without flare Continue present regimen

## 2013-12-03 NOTE — Progress Notes (Signed)
Subjective:    Patient ID: [name] [name], male    DOB: Oct 14, 1940, 73 y.o.   MRN: [id number]  HPI    OV 06/22/2013  Chief Complaint  Patient presents with  . Pulmonary Consult    HFU per [name]  for COPD and SOB.    Dyspnea and COPD referral from Dr Aundra Dubin  73 year old male. Use to run 5-20 miles each day till 10-20 years ago.HE is ex smoker 80 pack; quit 20 years ago followed by weight gain. THen several health issues including MS and bladder cancer and diastolic cHF.  Past few years insidious onset dyspnea, progresive, moderate in intensity with some asocited cough maybe. Brought on for climbing 1 flight of stairs and relieved by rest. THere is currently no associatd orthopnea, edema, wheeze  Dyspnea relevant hx    Exertional dyspnea: Lexiscan myoview (8/09) with EF 57%, diaphragmatic attenuation but no ischemia or infarction. Lexiscan myoview (3/11): EF 68% with fixed inferior defect likely due to diaphragmatic attenuation and no ischemia (similar to 2009). Echo (4/11): EF 91-50%, mild diastolic dysfunction, moderate mitral regurgitation, mild pulmonary HTN (PASP 38 mmHg), normal RV size and systolic function. PFTs (3/11) with moderate obstructive defect. Echo (12/14) with EF 50-55%, inferior and apical septal hypokinesis, mild LVH, mild mitral regurgitation.   CAD: LHC (1/15) with serial 50% and 60% mid LCx stenoses, 50% proximal RCA stenosis, EF 55% on LV-gram, LVEDP 7 mmHg.    COPD noted: Prior heavy smoker. PFTs (3/11): FVC 87%, FEV1 73%, ratio 0.57, DLCO 75%, TLC 121%. Moderate obstructive defect.  - per DR Aundra Dubin note. RX spiriva with some help    CXR 12/06/12: ? Chronic ILD changes - my interpretation   Walking desaturation test today in the office June 22 2013:180 feet x3 laps on room air: Dropped to 89% at the end of the third lap but promptly improved to 92% when he rested. He did not have any complaints   09/04/13  Follow up COPD and test results  Pt  returns for follow up and to review recent test results.  Pt says overall he is doing well. No flare in cough or dyspnea.  He had a CT chest done 06/2013 that was neg for ILD . RML ndoularity and 6 mm nodule noted. A CT chest has been set up for later this month. Mild emphysema and bronchial wall thickening.  PFT today showed FEV1 72%, ratio 68, FVC 77%, no sign BD response, decreased diffusing capacity at 66%.  Decreased Mid flows 53% w/ +BD response.  On Spiriva daily  Denies significant cough or wheezing .  No chest pain,orthopnea, edema or fever.  Was a heavy smoker for many years.   12/03/2013 Follow up COPD -Gold 2  Patient returns for three-month follow-up for COPD He remains on Spiriva daily Feels breathing  Denies any flare of cough or wheezing. No emergency room or hospitalizations since last visit Patient has a right middle lobe nodularity and 6 mm nodule noted on CT chest that is being followed with serial follow-up. Last CT on September 24 showed similar appearance of the right middle lobe nodule   Patient denies any chest pain, orthopnea, PND, leg swelling, hemoptysis , or unintentional weight loss  Followed by Dr. Marin Comment for Bladder [facility] .  Records requested from [facility] . Consent signed for care everywhere.   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 Went to grad school at [employer].     Past  Medical History  Diagnosis Date  . MS (multiple sclerosis)   . Arthritis   . Diastolic dysfunction   . Right bundle branch block   . Brugada syndrome   . COPD (chronic obstructive pulmonary disease)   . GERD (gastroesophageal reflux disease)   . Depression     with bipolar tendencies  . Urinary retention with incomplete bladder emptying     receives botox injections and treatment for BPH through [facility]  . BPH (benign prostatic hyperplasia)   . Bladder cancer     [facility], [facility] low grade papillary urothileal carcinoma  . CAD (coronary artery disease)     50% L circ, 50% RCA 1/15  .  HLD (hyperlipidemia)   . HTN (hypertension)   . CHF (congestive heart failure)   . Cardiac hypertrophy       Review of Systems  Constitutional: Negative for fever and unexpected weight change.  HENT: Negative for congestion, dental problem, ear pain, nosebleeds, postnasal drip, rhinorrhea, sinus pressure, sneezing, sore throat and trouble swallowing.   Eyes: Negative for redness and itching.  Respiratory: Positive for shortness of breath. Negative for cough, chest tightness and wheezing.   Cardiovascular: Negative for palpitations and leg swelling.  Gastrointestinal: Negative for nausea and vomiting.  Genitourinary: Negative for dysuria.  Musculoskeletal: Negative for joint swelling.  Skin: Negative for rash.  Neurological: Negative for headaches.  Hematological: Does not bruise/bleed easily.  Psychiatric/Behavioral: Negative for dysphoric mood. The patient is not nervous/anxious.        Objective:   Physical Exam  Nursing note and vitals reviewed. Constitutional: He is oriented to person, place, and time. He appears well-developed and well-nourished. No distress.  HENT:  Head: Normocephalic and atraumatic.  Right Ear: External ear normal.  Left Ear: External ear normal.  Mouth/Throat: Oropharynx is clear and moist. No oropharyngeal exudate.  Hearing aid  Eyes: Conjunctivae and EOM are normal. Pupils are equal, round, and reactive to light. Right eye exhibits no discharge. Left eye exhibits no discharge. No scleral icterus.  Neck: Normal range of motion. Neck supple. No JVD present. No tracheal deviation present. No thyromegaly present.  Cardiovascular: Normal rate, regular rhythm and intact distal pulses.  Exam reveals no gallop and no friction rub.   No murmur heard. Pulmonary/Chest: Effort normal and breath sounds normal. No respiratory distress. He has no wheezes. He has no rales. He exhibits no tenderness.  Abdominal: Soft. Bowel sounds are normal. He exhibits no  distension and no mass. There is no tenderness. There is no rebound and no guarding.  Musculoskeletal: Normal range of motion. He exhibits no edema and no tenderness.  Lymphadenopathy:    He has no cervical adenopathy.  Neurological: He is alert and oriented to person, place, and time. He has normal reflexes. No cranial nerve deficit. Coordination normal.  Skin: Skin is warm and dry. No rash noted. He is not diaphoretic. No erythema. No pallor.  Psychiatric: He has a normal mood and affect. His behavior is normal. Judgment and thought content normal.          Assessment & Plan:

## 2013-12-03 NOTE — Assessment & Plan Note (Signed)
Repeat CT September 24 showed similar nodularity along the right middle lobe and 6 mm nodule.  Scans were reviewed in detail with Dr. Chase Caller Patient has a history of bladder cancer and history of heavy smoking Records requested from [facility] proceed with a PET scan.

## 2013-12-06 ENCOUNTER — Ambulatory Visit: Payer: Medicare Other | Admitting: Cardiology

## 2013-12-11 ENCOUNTER — Ambulatory Visit (HOSPITAL_COMMUNITY)
Admission: RE | Admit: 2013-12-11 | Discharge: 2013-12-11 | Disposition: A | Discharge status: Home / Self Care | Payer: Medicare Other | Source: Ambulatory Visit | Attending: Adult Health | Admitting: Adult Health

## 2013-12-11 DIAGNOSIS — Z8551 Personal history of malignant neoplasm of bladder: Secondary | ICD-10-CM | POA: Diagnosis not present

## 2013-12-11 DIAGNOSIS — Z87891 Personal history of nicotine dependence: Secondary | ICD-10-CM | POA: Insufficient documentation

## 2013-12-11 DIAGNOSIS — R911 Solitary pulmonary nodule: Secondary | ICD-10-CM

## 2013-12-11 DIAGNOSIS — Z79899 Other long term (current) drug therapy: Secondary | ICD-10-CM | POA: Insufficient documentation

## 2013-12-11 LAB — GLUCOSE, CAPILLARY: Glucose-Capillary: 94 mg/dL (ref 70–99)

## 2013-12-11 MED ORDER — FLUDEOXYGLUCOSE F - 18 (FDG) INJECTION
10.1000 | Freq: Once | INTRAVENOUS | Status: AC | PRN
  Administered 2013-12-11: 10.1 via INTRAVENOUS

## 2013-12-13 ENCOUNTER — Encounter (HOSPITAL_COMMUNITY): Payer: Self-pay | Admitting: Cardiology

## 2013-12-14 ENCOUNTER — Telehealth: Payer: Self-pay | Admitting: Family Medicine

## 2013-12-14 MED ORDER — HYDROCODONE-ACETAMINOPHEN 10-325 MG PO TABS: 1.0000 | ORAL_TABLET | Freq: Four times a day (QID) | ORAL | Status: DC | PRN

## 2013-12-14 NOTE — Telephone Encounter (Signed)
Rx printed for provider signature and pt made aware ready for pick via VM

## 2013-12-14 NOTE — Telephone Encounter (Signed)
ok 

## 2013-12-14 NOTE — Telephone Encounter (Signed)
Pt last had filled 10/14 #100.  OK refill?

## 2013-12-14 NOTE — Telephone Encounter (Signed)
[phone] Pt is needing a refill on hydrocodone

## 2013-12-24 ENCOUNTER — Ambulatory Visit (HOSPITAL_COMMUNITY): Payer: Medicare Other

## 2013-12-26 DIAGNOSIS — G35 Multiple sclerosis: Secondary | ICD-10-CM | POA: Diagnosis not present

## 2014-01-09 ENCOUNTER — Telehealth: Payer: Self-pay | Admitting: Family Medicine

## 2014-01-09 DIAGNOSIS — F3130 Bipolar disorder, current episode depressed, mild or moderate severity, unspecified: Secondary | ICD-10-CM

## 2014-01-09 MED ORDER — ARIPIPRAZOLE 5 MG PO TABS: 5.0000 mg | ORAL_TABLET | Freq: Every day | ORAL | Status: DC

## 2014-01-09 NOTE — Telephone Encounter (Signed)
Patient is calling for refill on his aripiprazole  5mg  [facility]  [phone]

## 2014-01-09 NOTE — Telephone Encounter (Signed)
Medication refilled per protocol. 

## 2014-01-10 ENCOUNTER — Other Ambulatory Visit: Payer: Self-pay | Admitting: Cardiology

## 2014-01-16 DIAGNOSIS — K219 Gastro-esophageal reflux disease without esophagitis: Secondary | ICD-10-CM | POA: Diagnosis not present

## 2014-01-16 DIAGNOSIS — I10 Essential (primary) hypertension: Secondary | ICD-10-CM | POA: Diagnosis not present

## 2014-01-16 DIAGNOSIS — I4519 Other right bundle-branch block: Secondary | ICD-10-CM | POA: Diagnosis not present

## 2014-01-16 DIAGNOSIS — F329 Major depressive disorder, single episode, unspecified: Secondary | ICD-10-CM | POA: Diagnosis not present

## 2014-01-16 DIAGNOSIS — I2510 Atherosclerotic heart disease of native coronary artery without angina pectoris: Secondary | ICD-10-CM | POA: Diagnosis not present

## 2014-01-16 DIAGNOSIS — I509 Heart failure, unspecified: Secondary | ICD-10-CM | POA: Diagnosis not present

## 2014-01-16 DIAGNOSIS — N400 Enlarged prostate without lower urinary tract symptoms: Secondary | ICD-10-CM | POA: Diagnosis not present

## 2014-01-16 DIAGNOSIS — Z79899 Other long term (current) drug therapy: Secondary | ICD-10-CM | POA: Diagnosis not present

## 2014-01-16 DIAGNOSIS — Z87891 Personal history of nicotine dependence: Secondary | ICD-10-CM | POA: Diagnosis not present

## 2014-01-16 DIAGNOSIS — N3080 Other cystitis without hematuria: Secondary | ICD-10-CM | POA: Diagnosis not present

## 2014-01-16 DIAGNOSIS — J449 Chronic obstructive pulmonary disease, unspecified: Secondary | ICD-10-CM | POA: Diagnosis not present

## 2014-01-16 DIAGNOSIS — R9431 Abnormal electrocardiogram [ECG] [EKG]: Secondary | ICD-10-CM | POA: Diagnosis not present

## 2014-01-16 DIAGNOSIS — G35 Multiple sclerosis: Secondary | ICD-10-CM | POA: Diagnosis not present

## 2014-01-16 DIAGNOSIS — Z7982 Long term (current) use of aspirin: Secondary | ICD-10-CM | POA: Diagnosis not present

## 2014-01-16 DIAGNOSIS — C678 Malignant neoplasm of overlapping sites of bladder: Secondary | ICD-10-CM | POA: Diagnosis not present

## 2014-01-16 DIAGNOSIS — C680 Malignant neoplasm of urethra: Secondary | ICD-10-CM | POA: Diagnosis not present

## 2014-01-16 DIAGNOSIS — F419 Anxiety disorder, unspecified: Secondary | ICD-10-CM | POA: Diagnosis not present

## 2014-01-18 ENCOUNTER — Other Ambulatory Visit: Payer: Self-pay | Admitting: Cardiology

## 2014-01-23 DIAGNOSIS — C44311 Basal cell carcinoma of skin of nose: Secondary | ICD-10-CM | POA: Diagnosis not present

## 2014-01-24 DIAGNOSIS — G35 Multiple sclerosis: Secondary | ICD-10-CM | POA: Diagnosis not present

## 2014-02-01 ENCOUNTER — Encounter: Payer: Self-pay | Admitting: Cardiology

## 2014-02-01 ENCOUNTER — Ambulatory Visit (INDEPENDENT_AMBULATORY_CARE_PROVIDER_SITE_OTHER): Payer: Medicare Other | Admitting: Cardiology

## 2014-02-01 VITALS — BP 126/70 | HR 75 | Ht 71.0 in | Wt 216.0 lb

## 2014-02-01 DIAGNOSIS — R0602 Shortness of breath: Secondary | ICD-10-CM

## 2014-02-01 DIAGNOSIS — I2510 Atherosclerotic heart disease of native coronary artery without angina pectoris: Secondary | ICD-10-CM | POA: Diagnosis not present

## 2014-02-01 DIAGNOSIS — I5032 Chronic diastolic (congestive) heart failure: Secondary | ICD-10-CM

## 2014-02-01 DIAGNOSIS — I10 Essential (primary) hypertension: Secondary | ICD-10-CM | POA: Diagnosis not present

## 2014-02-01 LAB — LIPID PANEL
Cholesterol: 143 mg/dL (ref 0–200)
HDL: 42.4 mg/dL (ref >39.00)
NonHDL: 100.6
TRIGLYCERIDES: 212 mg/dL — AB (ref 0.0–149.0)
Total CHOL/HDL Ratio: 3
VLDL: 42.4 mg/dL — ABNORMAL HIGH (ref 0.0–40.0)

## 2014-02-01 LAB — BASIC METABOLIC PANEL
BUN: 19 mg/dL (ref 6–23)
CALCIUM: 9.2 mg/dL (ref 8.4–10.5)
CO2: 28 mEq/L (ref 19–32)
Chloride: 103 mEq/L (ref 96–112)
Creatinine, Ser: 1.15 mg/dL (ref 0.40–1.50)
GFR: 66.37 mL/min (ref >60.00)
GLUCOSE: 80 mg/dL (ref 70–99)
Potassium: 4.5 mEq/L (ref 3.5–5.1)
Sodium: 137 mEq/L (ref 135–145)

## 2014-02-01 LAB — LDL CHOLESTEROL, DIRECT: Direct LDL: 87 mg/dL

## 2014-02-01 NOTE — Progress Notes (Signed)
Patient ID: [name] [name], male   DOB: 1940-03-20, 74 y.o.   MRN: [id number] PCP: Dr. Dennard Schaumann  74 yo with history of HTN, hyperlipidemia, COPD, CAD, and GERD presents for cardiology followup. I have seen for workup of exertional dyspnea.  Cardiac workup was suggestive of a degree of diastolic CHF with nonobstructive CAD.  However, I did not think that diastolic CHF was able to explain all his symptoms.  I sent him to see pulmonary Chase Caller).  PFTs (9/15) showed only minimal obstruction and he did not have interstitial lung disease on CT.  He may have a component of asthma.  He has struggled with bipolar depression.   Recently, he has been doing pretty well.  He is on Spiriva and Lasix 40 mg bid.  Weight is stable.  He says that he can walk 2-3 miles without dyspnea.  He seems to be doing better overall.  No orthopnea/PND. No wheezing.  No chest pain.     ECG: NSR, iRBBB, LAFB  Labs (6/10): LDL 81, HDL 66, K 4.5, creatinine 1.04  Labs (4/11): BNP 75  Labs (12/14): BNP 380, K 5, creatinine 1.18, normal cortisol Labs (1/15): K 4.5, creatinine 1.5 => 1.2, BNP 15, HDL 37.5, LDL 63 Labs (6/15): K 5.1, creatinine 1.4, HCT 37.6  Allergies (verified):  1) ! Codeine Sulfate (Codeine Sulfate)   Past Medical History:  1. Hypertension  2. Hypercholesterolemia  3. GERD  4. BPH  5. Osteoarthritis: s/p R TKR  6. Multiple sclerosis: Quiescent over last few years. Diagnosed at age 32. Sees neurology at [facility]. 7. Rotator cuff surgery 2/11  8. COPD: Prior heavy smoker. PFTs (3/11): FVC 87%, FEV1 73%, ratio 0.57, DLCO 75%, TLC 121%. Moderate obstructive defect. PFTs (9/15): Only minimal obstruction, suggestive of very mild COPD.  Possible asthma component.  9. Exertional dyspnea: Lexiscan myoview (8/09) with EF 57%, diaphragmatic attenuation but no ischemia or infarction. Lexiscan myoview (3/11): EF 68% with fixed inferior defect likely due to diaphragmatic attenuation and no ischemia (similar to 2009).  Echo (4/11): EF 76-72%, mild diastolic dysfunction, moderate mitral regurgitation, mild pulmonary HTN (PASP 38 mmHg), normal RV size and systolic function. PFTs (3/11) with moderate obstructive defect.  Echo (12/14) with EF 50-55%, inferior and apical septal hypokinesis, mild LVH, mild mitral regurgitation. PFTs (9/15) with only minimal obstruction.  CT chest did not show interstitial lung disease.  10. Mitral regurgitation. Echo (4/11) with PISA ERO 0.3 cm^2 and regurgitant volume 41 mL (moderate MR).  Echo (12/14) with mild MR.   11. Carotid dopplers (3/11): no significant stenosis.  12. Possible Type II Brugada ECG pattern. No family history of SCD, no syncope, no tachypalpitations. 13. Dementia: Mild.  14. Low testosterone 15. Bladder cancer: Followed at [place]. CAD: LHC (1/15) with serial 50% and 60% mid LCx stenoses, 50% proximal RCA stenosis, EF 55% on LV-gram, LVEDP 7 mmHg.  17. CKD 18. Lung nodule on CT in 2015: PET scan 12/15 not suggestive of malignancy.  49. Bipolar disorder  Family History:  Family History Hypertension  Father history of stroke. Mother history of stroke.  Sister with hypertension. Brother with kidney cancer, arthritis and bilateral knee replacements.  No premature CAD  No sudden cardiac death   Social History:  Married with children.  Lives in [place]  Tobacco Use - Former. started at age 57. 2 ppd. quit 1996.  Alcohol Use - no   Review of Systems  All systems reviewed and negative except as per HPI.  Current Outpatient Prescriptions  Medication Sig Dispense Refill  . amLODipine (NORVASC) 5 MG tablet Take 1 tablet (5 mg total) by mouth daily. 90 tablet 0  . ARIPiprazole (ABILIFY) 5 MG tablet Take 1 tablet (5 mg total) by mouth daily. 90 tablet 1  . aspirin EC 81 MG tablet Take 81 mg by mouth daily.     Marland Kitchen atorvastatin (LIPITOR) 20 MG tablet TAKE 1 TABLET DAILY 90 tablet 0  . bisoprolol (ZEBETA) 5 MG tablet TAKE ONE-HALF (1/2) TABLET DAILY 45  tablet 3  . cyclobenzaprine (FLEXERIL) 10 MG tablet TAKE 1 TABLET BY MOUTH 3 TIMES A DAY AS NEEDED FOR MUSCLE SPASMS 30 tablet 2  . dexmethylphenidate (FOCALIN XR) 10 MG 24 hr capsule Take 10 mg by mouth daily.    Marland Kitchen donepezil (ARICEPT) 5 MG tablet Take 2 tablets by mouth at bedtime.    . furosemide (LASIX) 40 MG tablet TAKE 1 TABLET TWICE A DAY 180 tablet 0  . HYDROcodone-acetaminophen (NORCO) 10-325 MG per tablet Take 1 tablet by mouth every 6 (six) hours as needed. 100 tablet 0  . losartan (COZAAR) 50 MG tablet TAKE 1 TABLET DAILY 90 tablet 3  . methylphenidate (METADATE CD) 10 MG CR capsule Take 1 capsule (10 mg total) by mouth every morning. 30 capsule 0  . NAPROXEN PO Take 600 mg by mouth daily.    . ondansetron (ZOFRAN) 4 MG tablet Take 1 tablet (4 mg total) by mouth every 8 (eight) hours as needed for nausea or vomiting. 20 tablet 1  . PROAIR HFA 108 (90 BASE) MCG/ACT inhaler INHALE 1 TO 2 PUFFS EVERY 6 HOURS AS NEEDED FOR WHEEZING OR SHORTNESS OF BREATH 3 each 5  . rOPINIRole (REQUIP) 1 MG tablet Take 1 mg by mouth at bedtime.    Marland Kitchen SPIRIVA HANDIHALER 18 MCG inhalation capsule INHALE THE CONTENTS OF 1 CAPSULE WITH 2         INHALATIONS ONCE DAILY AS DIRECTED 1 capsule 11  . testosterone cypionate (DEPOTESTOTERONE CYPIONATE) 200 MG/ML injection Inject 1 mL (200 mg total) into the muscle every 14 (fourteen) days. 10 mL 5  . venlafaxine XR (EFFEXOR-XR) 150 MG 24 hr capsule Take 1 capsule twice a day.     No current facility-administered medications for this visit.    BP 126/70 mmHg  Pulse 75  Ht 5\' 11"  (1.803 m)  Wt 216 lb (97.977 kg)  BMI 30.14 kg/m2 General: NAD Neck: No JVD, no thyromegaly or thyroid nodule.  Lungs: Clear to auscultation bilaterally with normal respiratory effort. CV: Nondisplaced PMI.  Heart regular S1/S2, no S3/S4, no murmur.  No ankle edema.  No carotid bruit.  Normal pedal pulses.  Abdomen: Soft, nontender, no hepatosplenomegaly, no distention.  Skin: Intact  without lesions or rashes.  Neurologic: Alert and oriented x 3.  Psych: Normal affect. Extremities: No clubbing or cyanosis.   Assessment/Plan: 1. Chronic diastolic CHF: Echo with EF 50-55% and wall motion abnormalities.  He is not volume overloaded on exam and has NYHA class I-II symptoms currently. - Continue current Lasix and check BMET. 2. CAD: Nonobstructive CAD on on cardiac cath.  Continue ASA 81 daily and atorvastatin. 3. HTN: BP controlled.   4. Exertional dyspnea: This seems to be under much better control.  Will continue Lasix and Spiriva.  Suspect diastolic CHF, asthma/mild COPD, and deconditioning all contributed to his symptoms.   5. Hyperlipidemia: Check lipids today.    Loralie Champagne 02/01/2014

## 2014-02-01 NOTE — Patient Instructions (Signed)
Your physician wants you to follow-up in: 12 months with Dr. Kendall Flack will receive a reminder letter in the mail two months in advance. If you don't receive a letter, please call our office to schedule the follow-up appointment.  Your physician recommends that you return for lab work today: LIPID/BMP

## 2014-02-04 ENCOUNTER — Other Ambulatory Visit: Payer: Self-pay | Admitting: Family Medicine

## 2014-02-07 ENCOUNTER — Other Ambulatory Visit: Payer: Self-pay | Admitting: Cardiology

## 2014-02-20 ENCOUNTER — Other Ambulatory Visit: Payer: Self-pay | Admitting: Family Medicine

## 2014-02-21 ENCOUNTER — Other Ambulatory Visit: Payer: Self-pay | Admitting: Family Medicine

## 2014-02-28 ENCOUNTER — Encounter: Payer: Self-pay | Admitting: *Deleted

## 2014-03-06 ENCOUNTER — Telehealth: Payer: Self-pay | Admitting: Family Medicine

## 2014-03-06 NOTE — Telephone Encounter (Signed)
[phone] PT is needing a refill on HYDROcodone-acetaminophen (NORCO) 10-325 MG per tablet and Testerone injections

## 2014-03-06 NOTE — Telephone Encounter (Signed)
Ok to refill??  Last office visit 10/23/2013.  Last refill Testosterone 06/18/2013, #5 refills.   Last refill Norco 12/14/2013.

## 2014-03-07 ENCOUNTER — Telehealth: Payer: Self-pay | Admitting: Cardiology

## 2014-03-07 MED ORDER — HYDROCODONE-ACETAMINOPHEN 10-325 MG PO TABS: 1.0000 | ORAL_TABLET | Freq: Four times a day (QID) | ORAL | Status: DC | PRN

## 2014-03-07 MED ORDER — TESTOSTERONE CYPIONATE 200 MG/ML IM SOLN: 200.0000 mg | INTRAMUSCULAR | Status: DC

## 2014-03-07 NOTE — Telephone Encounter (Signed)
ok 

## 2014-03-07 NOTE — Telephone Encounter (Signed)
Spoke with patient about lab results from 02/01/14

## 2014-03-07 NOTE — Telephone Encounter (Signed)
New message      Returning a nurses call from early feb----[place]

## 2014-03-07 NOTE — Telephone Encounter (Signed)
Prescription printed and patient made aware to come to office to pick up.  

## 2014-03-12 ENCOUNTER — Telehealth: Payer: Self-pay | Admitting: Family Medicine

## 2014-03-12 NOTE — Telephone Encounter (Signed)
I don't think he is taking this, denied.

## 2014-03-12 NOTE — Telephone Encounter (Signed)
Pharmacy has sent a refill request for Clonazepam 0.5 mg TID (not prn).  I don't find this anywhere in patient profile.  Last RF date on request is "08/07/2012"  Please advise????

## 2014-03-12 NOTE — Telephone Encounter (Signed)
I called pharmacy.  They say original Rx from a Dr Diamantina Monks at a [phone]  I told them we do not know who that is and Dr Dennard Schaumann is denying refill.  Pharmacist said patient told them he was seeing this doctor and has transferred to Dr Dennard Schaumann.  I them he has been a patient here for some time.  Refill denied.  Pt can contact office.  Also looked patient up in state registry.  No clonazepam seen in last 6 months

## 2014-03-20 DIAGNOSIS — C44311 Basal cell carcinoma of skin of nose: Secondary | ICD-10-CM | POA: Diagnosis not present

## 2014-03-20 DIAGNOSIS — Z85828 Personal history of other malignant neoplasm of skin: Secondary | ICD-10-CM | POA: Diagnosis not present

## 2014-03-21 ENCOUNTER — Other Ambulatory Visit: Payer: Self-pay | Admitting: Cardiology

## 2014-03-29 ENCOUNTER — Other Ambulatory Visit: Payer: Self-pay | Admitting: Cardiology

## 2014-04-22 ENCOUNTER — Telehealth: Payer: Self-pay | Admitting: Family Medicine

## 2014-04-22 MED ORDER — HYDROCODONE-ACETAMINOPHEN 10-325 MG PO TABS: 1.0000 | ORAL_TABLET | Freq: Four times a day (QID) | ORAL | Status: DC | PRN

## 2014-04-22 NOTE — Telephone Encounter (Signed)
RX printed, left up front and patient aware to pick up via vm 

## 2014-04-22 NOTE — Telephone Encounter (Signed)
ok 

## 2014-04-22 NOTE — Telephone Encounter (Signed)
Patient asking for refill on hydrocodone  [phone]

## 2014-04-22 NOTE — Telephone Encounter (Signed)
?   OK to Refill  

## 2014-04-24 DIAGNOSIS — Z85828 Personal history of other malignant neoplasm of skin: Secondary | ICD-10-CM | POA: Diagnosis not present

## 2014-04-24 DIAGNOSIS — K219 Gastro-esophageal reflux disease without esophagitis: Secondary | ICD-10-CM | POA: Diagnosis not present

## 2014-04-24 DIAGNOSIS — N319 Neuromuscular dysfunction of bladder, unspecified: Secondary | ICD-10-CM | POA: Diagnosis not present

## 2014-04-24 DIAGNOSIS — I498 Other specified cardiac arrhythmias: Secondary | ICD-10-CM | POA: Diagnosis not present

## 2014-04-24 DIAGNOSIS — J449 Chronic obstructive pulmonary disease, unspecified: Secondary | ICD-10-CM | POA: Diagnosis not present

## 2014-04-24 DIAGNOSIS — N401 Enlarged prostate with lower urinary tract symptoms: Secondary | ICD-10-CM | POA: Diagnosis not present

## 2014-04-24 DIAGNOSIS — G35 Multiple sclerosis: Secondary | ICD-10-CM | POA: Diagnosis not present

## 2014-05-23 ENCOUNTER — Other Ambulatory Visit: Payer: Self-pay | Admitting: Family Medicine

## 2014-05-27 DIAGNOSIS — F329 Major depressive disorder, single episode, unspecified: Secondary | ICD-10-CM | POA: Diagnosis not present

## 2014-05-27 DIAGNOSIS — I454 Nonspecific intraventricular block: Secondary | ICD-10-CM | POA: Diagnosis not present

## 2014-05-27 DIAGNOSIS — C4401 Basal cell carcinoma of skin of lip: Secondary | ICD-10-CM | POA: Diagnosis not present

## 2014-05-27 DIAGNOSIS — C44319 Basal cell carcinoma of skin of other parts of face: Secondary | ICD-10-CM | POA: Diagnosis not present

## 2014-05-27 DIAGNOSIS — Z9889 Other specified postprocedural states: Secondary | ICD-10-CM | POA: Diagnosis not present

## 2014-05-27 DIAGNOSIS — H9190 Unspecified hearing loss, unspecified ear: Secondary | ICD-10-CM | POA: Diagnosis not present

## 2014-05-27 DIAGNOSIS — M1990 Unspecified osteoarthritis, unspecified site: Secondary | ICD-10-CM | POA: Diagnosis not present

## 2014-05-27 DIAGNOSIS — Z483 Aftercare following surgery for neoplasm: Secondary | ICD-10-CM | POA: Diagnosis not present

## 2014-05-27 DIAGNOSIS — F4310 Post-traumatic stress disorder, unspecified: Secondary | ICD-10-CM | POA: Diagnosis not present

## 2014-05-27 DIAGNOSIS — N400 Enlarged prostate without lower urinary tract symptoms: Secondary | ICD-10-CM | POA: Diagnosis not present

## 2014-05-27 DIAGNOSIS — C44311 Basal cell carcinoma of skin of nose: Secondary | ICD-10-CM | POA: Diagnosis not present

## 2014-05-27 DIAGNOSIS — F419 Anxiety disorder, unspecified: Secondary | ICD-10-CM | POA: Diagnosis not present

## 2014-05-27 DIAGNOSIS — G35 Multiple sclerosis: Secondary | ICD-10-CM | POA: Diagnosis not present

## 2014-05-27 DIAGNOSIS — I10 Essential (primary) hypertension: Secondary | ICD-10-CM | POA: Diagnosis not present

## 2014-05-27 DIAGNOSIS — Z481 Encounter for planned postprocedural wound closure: Secondary | ICD-10-CM | POA: Diagnosis not present

## 2014-05-27 DIAGNOSIS — R785 Finding of other psychotropic drug in blood: Secondary | ICD-10-CM | POA: Diagnosis not present

## 2014-05-27 DIAGNOSIS — K219 Gastro-esophageal reflux disease without esophagitis: Secondary | ICD-10-CM | POA: Diagnosis not present

## 2014-05-27 DIAGNOSIS — J449 Chronic obstructive pulmonary disease, unspecified: Secondary | ICD-10-CM | POA: Diagnosis not present

## 2014-06-17 ENCOUNTER — Telehealth: Payer: Self-pay | Admitting: Family Medicine

## 2014-06-17 DIAGNOSIS — Z884 Allergy status to anesthetic agent status: Secondary | ICD-10-CM | POA: Diagnosis not present

## 2014-06-17 DIAGNOSIS — C44311 Basal cell carcinoma of skin of nose: Secondary | ICD-10-CM | POA: Diagnosis not present

## 2014-06-17 DIAGNOSIS — Z87891 Personal history of nicotine dependence: Secondary | ICD-10-CM | POA: Diagnosis not present

## 2014-06-17 MED ORDER — HYDROCODONE-ACETAMINOPHEN 10-325 MG PO TABS: 1.0000 | ORAL_TABLET | Freq: Four times a day (QID) | ORAL | Status: DC | PRN

## 2014-06-17 NOTE — Telephone Encounter (Signed)
ok 

## 2014-06-17 NOTE — Telephone Encounter (Signed)
RX printed, left up front and patient aware to pick up  

## 2014-06-17 NOTE — Telephone Encounter (Signed)
?   OK to Refill  

## 2014-06-17 NOTE — Telephone Encounter (Signed)
Patient's wife calling to get rx written for his hydrocodone  [phone]

## 2014-06-19 ENCOUNTER — Other Ambulatory Visit: Payer: Self-pay | Admitting: Family Medicine

## 2014-06-24 ENCOUNTER — Other Ambulatory Visit: Payer: Self-pay | Admitting: Family Medicine

## 2014-06-24 DIAGNOSIS — Z85828 Personal history of other malignant neoplasm of skin: Secondary | ICD-10-CM | POA: Diagnosis not present

## 2014-07-12 ENCOUNTER — Other Ambulatory Visit: Payer: Self-pay | Admitting: Physician Assistant

## 2014-07-12 ENCOUNTER — Encounter: Payer: Self-pay | Admitting: Family Medicine

## 2014-07-12 NOTE — Telephone Encounter (Signed)
LOV 10/23/13.  Last testosterone level done 10/12/12.  LRF 03/07/14 20ml +5RF.  OK refill??

## 2014-07-12 NOTE — Telephone Encounter (Signed)
Due for ov and level.

## 2014-07-12 NOTE — Telephone Encounter (Signed)
Returned denied to pharmacy.  Letter to pt to make appt.

## 2014-07-18 DIAGNOSIS — C680 Malignant neoplasm of urethra: Secondary | ICD-10-CM | POA: Diagnosis not present

## 2014-07-25 ENCOUNTER — Encounter: Payer: Self-pay | Admitting: Family Medicine

## 2014-07-25 ENCOUNTER — Ambulatory Visit (INDEPENDENT_AMBULATORY_CARE_PROVIDER_SITE_OTHER): Payer: Medicare Other | Admitting: Family Medicine

## 2014-07-25 VITALS — BP 118/70 | HR 80 | Temp 97.8°F | Resp 18 | Ht 71.0 in | Wt 216.0 lb

## 2014-07-25 DIAGNOSIS — F3130 Bipolar disorder, current episode depressed, mild or moderate severity, unspecified: Secondary | ICD-10-CM | POA: Diagnosis not present

## 2014-07-25 DIAGNOSIS — I5032 Chronic diastolic (congestive) heart failure: Secondary | ICD-10-CM

## 2014-07-25 DIAGNOSIS — J449 Chronic obstructive pulmonary disease, unspecified: Secondary | ICD-10-CM

## 2014-07-25 DIAGNOSIS — R7309 Other abnormal glucose: Secondary | ICD-10-CM | POA: Diagnosis not present

## 2014-07-25 DIAGNOSIS — Z125 Encounter for screening for malignant neoplasm of prostate: Secondary | ICD-10-CM | POA: Diagnosis not present

## 2014-07-25 DIAGNOSIS — I509 Heart failure, unspecified: Secondary | ICD-10-CM | POA: Diagnosis not present

## 2014-07-25 DIAGNOSIS — I2510 Atherosclerotic heart disease of native coronary artery without angina pectoris: Secondary | ICD-10-CM

## 2014-07-25 DIAGNOSIS — IMO0001 Reserved for inherently not codable concepts without codable children: Secondary | ICD-10-CM

## 2014-07-25 DIAGNOSIS — R7989 Other specified abnormal findings of blood chemistry: Secondary | ICD-10-CM

## 2014-07-25 DIAGNOSIS — F32A Depression, unspecified: Secondary | ICD-10-CM

## 2014-07-25 DIAGNOSIS — E291 Testicular hypofunction: Secondary | ICD-10-CM | POA: Diagnosis not present

## 2014-07-25 DIAGNOSIS — F329 Major depressive disorder, single episode, unspecified: Secondary | ICD-10-CM | POA: Diagnosis not present

## 2014-07-25 DIAGNOSIS — R7303 Prediabetes: Secondary | ICD-10-CM

## 2014-07-25 LAB — CBC WITH DIFFERENTIAL/PLATELET
BASOS PCT: 1 % (ref 0–1)
Basophils Absolute: 0.1 10*3/uL (ref 0.0–0.1)
EOS ABS: 0.6 10*3/uL (ref 0.0–0.7)
EOS PCT: 7 % — AB (ref 0–5)
HEMATOCRIT: 44.8 % (ref 39.0–52.0)
Hemoglobin: 14.4 g/dL (ref 13.0–17.0)
LYMPHS ABS: 2.3 10*3/uL (ref 0.7–4.0)
Lymphocytes Relative: 26 % (ref 12–46)
MCH: 29.1 pg (ref 26.0–34.0)
MCHC: 32.1 g/dL (ref 30.0–36.0)
MCV: 90.7 fL (ref 78.0–100.0)
MONOS PCT: 9 % (ref 3–12)
MPV: 9.5 fL (ref 8.6–12.4)
Monocytes Absolute: 0.8 10*3/uL (ref 0.1–1.0)
Neutro Abs: 5 10*3/uL (ref 1.7–7.7)
Neutrophils Relative %: 57 % (ref 43–77)
Platelets: 394 10*3/uL (ref 150–400)
RBC: 4.94 MIL/uL (ref 4.22–5.81)
RDW: 14.7 % (ref 11.5–15.5)
WBC: 8.7 10*3/uL (ref 4.0–10.5)

## 2014-07-25 NOTE — Progress Notes (Signed)
Subjective:    Patient ID: [name] [name], male    DOB: [birth year], 74 y.o.   MRN: [id number]  HPI  Patient is here today for recheck. Since starting the Abilify last fall he is been doing very well. The mood swings have diminished greatly. The depression is better controlled. Furthermore he is not having any violent outburst. His wife accompanies him today and echo is the same sentiments. His blood pressure today is well controlled at 118/70. He denies any chest pain. He does complain of dyspnea on exertion and fatigue. This is been a chronic complaint. He does not seem to be getting any better. He does not feel that the testosterone replacement that he is taking is helping with his fatigue. Therefore we had a discussion today about possibly stopping this given the other risks of taking testosterone. He is due for a PSA. He is also due for a lipid panel to monitor his cholesterol. He also has a history of prediabetes and given the fact he is taking Abilify he is overdue to check his blood sugar and hemoglobin A1c to ensure that the medication has not exacerbated his prediabetes Past Medical History  Diagnosis Date  . MS (multiple sclerosis)   . Arthritis   . Diastolic dysfunction   . Right bundle branch block   . Brugada syndrome   . COPD (chronic obstructive pulmonary disease)   . GERD (gastroesophageal reflux disease)   . Depression     with bipolar tendencies  . Urinary retention with incomplete bladder emptying     receives botox injections and treatment for BPH through [facility]  . BPH (benign prostatic hyperplasia)   . Bladder cancer     [facility], Ta low grade papillary urothileal carcinoma  . CAD (coronary artery disease)     50% L circ, 50% RCA 1/15  . HLD (hyperlipidemia)   . HTN (hypertension)   . CHF (congestive heart failure)   . Cardiac hypertrophy    Past Surgical History  Procedure Laterality Date  . Total knee arthroplasty Right   . Transurethral resection of prostate      . Tonsillectomy    . Appendectomy    . Rotator cuff repair    . Hemorrhoid surgery    . Left heart catheterization with coronary angiogram N/A 01/05/2013    Procedure: LEFT HEART CATHETERIZATION WITH CORONARY ANGIOGRAM;  Surgeon: Larey Dresser, MD;  Location: [facility] CATH LAB;  Service: Cardiovascular;  Laterality: N/A;   Current Outpatient Prescriptions on File Prior to Visit  Medication Sig Dispense Refill  . amLODipine (NORVASC) 5 MG tablet TAKE 1 TABLET DAILY 90 tablet 2  . ARIPiprazole (ABILIFY) 5 MG tablet TAKE 1 TABLET BY MOUTH EVERY DAY 30 tablet 2  . ARIPiprazole (ABILIFY) 5 MG tablet TAKE 1 TABLET DAILY 90 tablet 3  . aspirin EC 81 MG tablet Take 81 mg by mouth daily.     Marland Kitchen atorvastatin (LIPITOR) 20 MG tablet TAKE 1 TABLET DAILY 90 tablet 3  . bisoprolol (ZEBETA) 5 MG tablet TAKE ONE-HALF (1/2) TABLET DAILY 45 tablet 2  . cyclobenzaprine (FLEXERIL) 10 MG tablet TAKE 1 TABLET BY MOUTH 3 TIMES A DAY AS NEEDED FOR MUSCLE SPASMS 30 tablet 2  . dexmethylphenidate (FOCALIN XR) 10 MG 24 hr capsule Take 10 mg by mouth daily.    . divalproex (DEPAKOTE ER) 500 MG 24 hr tablet TAKE 1 TABLET AT BEDTIME 90 tablet 3  . donepezil (ARICEPT) 5 MG tablet Take 2 tablets by  mouth at bedtime.    . furosemide (LASIX) 40 MG tablet TAKE 1 TABLET TWICE A DAY 180 tablet 3  . HYDROcodone-acetaminophen (NORCO) 10-325 MG per tablet Take 1 tablet by mouth every 6 (six) hours as needed. 100 tablet 0  . losartan (COZAAR) 50 MG tablet TAKE 1 TABLET DAILY 90 tablet 2  . methylphenidate (METADATE CD) 10 MG CR capsule Take 1 capsule (10 mg total) by mouth every morning. 30 capsule 0  . NAPROXEN PO Take 600 mg by mouth daily.    Marland Kitchen omeprazole (PRILOSEC) 40 MG capsule TAKE 1 CAPSULE DAILY 90 capsule 3  . ondansetron (ZOFRAN) 4 MG tablet Take 1 tablet (4 mg total) by mouth every 8 (eight) hours as needed for nausea or vomiting. 20 tablet 1  . PROAIR HFA 108 (90 BASE) MCG/ACT inhaler INHALE 1 TO 2 PUFFS EVERY 6 HOURS AS  NEEDED FOR WHEEZING OR SHORTNESS OF BREATH 3 each 5  . rOPINIRole (REQUIP) 1 MG tablet Take 1 mg by mouth at bedtime.    Marland Kitchen rOPINIRole (REQUIP) 1 MG tablet TAKE 1 TABLET AT BEDTIME 90 tablet 3  . SPIRIVA HANDIHALER 18 MCG inhalation capsule INHALE THE CONTENTS OF 1 CAPSULE WITH 2         INHALATIONS ONCE DAILY AS DIRECTED 1 capsule 11  . testosterone cypionate (DEPOTESTOTERONE CYPIONATE) 200 MG/ML injection Inject 1 mL (200 mg total) into the muscle every 14 (fourteen) days. 10 mL 5  . venlafaxine XR (EFFEXOR-XR) 150 MG 24 hr capsule Take 1 capsule twice a day.     No current facility-administered medications on file prior to visit.   Allergies  Allergen Reactions  . Acetylcholine     unknown  . Alcohol-Sulfur [Sulfur]     unknown  . Amitriptyline     unknown  . Bupivacaine     unknown  . Clomipramine Hcl     unknown  . Cocaine     unknown  . Desipramine     unknown  . Ergonovine     unknown  . Flecainide     unknown  . Lithium     unknown  . Loxapine     unknown  . Nortriptyline     unknown  . Oxcarbazepine     unknown  . Procainamide     unknown  . Procaine     unknwon  . Propafenone     unknown  . Propofol     unknown  . Trifluoperazine     unknown   History   Social History  . Marital Status: Married    Spouse Name: N/A  . Number of Children: N/A  . Years of Education: N/A   Occupational History  . retired    Social History Main Topics  . Smoking status: Former Smoker -- 2.00 packs/day for 40 years    Types: Cigarettes    Quit date: 01/04/1993  . Smokeless tobacco: Former Systems developer    Types: Chew  . Alcohol Use: Yes     Comment: 6 pack/day  . Drug Use: Not on file  . Sexual Activity: Not on file   Other Topics Concern  . Not on file   Social History Narrative     Review of Systems  All other systems reviewed and are negative.      Objective:   Physical Exam  Constitutional: He is oriented to person, place, and time. He appears  well-developed and well-nourished. No distress.  HENT:  Head: Normocephalic and atraumatic.  Mouth/Throat: Oropharynx is clear and moist.  Neck: Neck supple. No JVD present. No thyromegaly present.  Cardiovascular: Normal rate, regular rhythm and normal heart sounds.   No murmur heard. Pulmonary/Chest: Effort normal and breath sounds normal. No respiratory distress. He has no wheezes. He has no rales.  Abdominal: Soft. Bowel sounds are normal. He exhibits no distension. There is no tenderness. There is no rebound and no guarding.  Musculoskeletal: He exhibits no edema.  Neurological: He is alert and oriented to person, place, and time.  Skin: He is not diaphoretic.  Vitals reviewed.         Assessment & Plan:  Bipolar disorder, most recent episode depressed, remission status unspecified  Depression  Chronic diastolic congestive heart failure - Plan: COMPLETE METABOLIC PANEL WITH GFR, CBC with Differential/Platelet, Lipid panel  Low testosterone - Plan: Testosterone, PSA, Medicare  COPD bronchitis  Prediabetes - Plan: Hemoglobin A1c  Continue Abilify and Effexor as this seems to be helping his depression and bipolar disorder the most. This is the best I have seen the patient regarding his mood in several years. At present his blood pressures well controlled. There is no evidence of fluid overload on exam today. There is no evidence of decompensated diastolic congestive heart failure. Given the fact that the testosterone does not seem to be helping his fatigue, I have recommended the patient discontinue testosterone. I will check a testosterone level to make sure that he was appropriately dosed and if the testosterone is normal, I think we should stop the medication as it is not helping. I will also check a PSA to monitor for prostate cancer. Patient's COPD is stable. We will continue this per [facility]. I will also check a hemoglobin A1c to monitor his prediabetes on the Abilify. I  recommended the patient discontinue oxybutynin given the cognitive side effects that medication and try myrbetriq 25 mg a day. I gave him one month of samples.

## 2014-07-26 LAB — COMPLETE METABOLIC PANEL WITH GFR
ALT: 23 U/L (ref 0–53)
AST: 29 U/L (ref 0–37)
Albumin: 4 g/dL (ref 3.5–5.2)
Alkaline Phosphatase: 87 U/L (ref 39–117)
BUN: 20 mg/dL (ref 6–23)
CO2: 22 mEq/L (ref 19–32)
Calcium: 9.6 mg/dL (ref 8.4–10.5)
Chloride: 97 mEq/L (ref 96–112)
Creat: 1.31 mg/dL (ref 0.50–1.35)
GFR, EST AFRICAN AMERICAN: 62 mL/min
GFR, Est Non African American: 54 mL/min — ABNORMAL LOW
Glucose, Bld: 93 mg/dL (ref 70–99)
Potassium: 4.6 mEq/L (ref 3.5–5.3)
Sodium: 136 mEq/L (ref 135–145)
Total Bilirubin: 0.5 mg/dL (ref 0.2–1.2)
Total Protein: 7.5 g/dL (ref 6.0–8.3)

## 2014-07-26 LAB — PSA, MEDICARE: PSA: 1.59 ng/mL (ref <=4.00)

## 2014-07-26 LAB — LIPID PANEL
Cholesterol: 144 mg/dL (ref 0–200)
HDL: 46 mg/dL (ref >=40)
LDL Cholesterol: 73 mg/dL (ref 0–99)
TRIGLYCERIDES: 123 mg/dL (ref <150)
Total CHOL/HDL Ratio: 3.1 Ratio
VLDL: 25 mg/dL (ref 0–40)

## 2014-07-26 LAB — TESTOSTERONE: Testosterone: 224 ng/dL — ABNORMAL LOW (ref 300–890)

## 2014-07-26 LAB — HEMOGLOBIN A1C
HEMOGLOBIN A1C: 6.4 % — AB (ref <5.7)
Mean Plasma Glucose: 137 mg/dL — ABNORMAL HIGH (ref <117)

## 2014-08-02 ENCOUNTER — Telehealth: Payer: Self-pay | Admitting: Family Medicine

## 2014-08-02 NOTE — Telephone Encounter (Signed)
ok 

## 2014-08-02 NOTE — Telephone Encounter (Signed)
Patient is calling to to get rx for his hydrocodone  [phone]

## 2014-08-02 NOTE — Telephone Encounter (Signed)
?  ok to refill °

## 2014-08-05 MED ORDER — HYDROCODONE-ACETAMINOPHEN 10-325 MG PO TABS: 1.0000 | ORAL_TABLET | Freq: Four times a day (QID) | ORAL | Status: DC | PRN

## 2014-08-05 NOTE — Telephone Encounter (Signed)
Script printed,ready for provider signature 

## 2014-08-06 ENCOUNTER — Encounter: Payer: Self-pay | Admitting: Adult Health

## 2014-08-06 ENCOUNTER — Ambulatory Visit (INDEPENDENT_AMBULATORY_CARE_PROVIDER_SITE_OTHER): Payer: Medicare Other | Admitting: Adult Health

## 2014-08-06 ENCOUNTER — Telehealth: Payer: Self-pay | Admitting: Family Medicine

## 2014-08-06 VITALS — BP 112/72 | HR 72 | Temp 97.2°F | Ht 72.0 in | Wt 210.0 lb

## 2014-08-06 DIAGNOSIS — R911 Solitary pulmonary nodule: Secondary | ICD-10-CM

## 2014-08-06 DIAGNOSIS — J439 Emphysema, unspecified: Secondary | ICD-10-CM | POA: Diagnosis not present

## 2014-08-06 DIAGNOSIS — Z23 Encounter for immunization: Secondary | ICD-10-CM

## 2014-08-06 DIAGNOSIS — I2510 Atherosclerotic heart disease of native coronary artery without angina pectoris: Secondary | ICD-10-CM

## 2014-08-06 NOTE — Telephone Encounter (Signed)
Per previous note - rx was printed, signed and is ready to be picked up and now pt is aware via vm.

## 2014-08-06 NOTE — Telephone Encounter (Signed)
?   OK to Refill  

## 2014-08-06 NOTE — Telephone Encounter (Signed)
ok 

## 2014-08-06 NOTE — Assessment & Plan Note (Signed)
Pulmonary nodules , stable since June 2015 Negative PET in December 2015. Needs repeat serial, CT chest  Plan Set up CT chest

## 2014-08-06 NOTE — Patient Instructions (Addendum)
Prevnar Vaccine today  CT chest to follow lung nodule  Referral to pulmonary rehab.  Continue on Spiriva .  follow up Dr. Chase Caller in 6 months and As needed

## 2014-08-06 NOTE — Assessment & Plan Note (Signed)
Moderate COPD with emphysema  Spirometry shows just a slight decrease in his FEV1.  We'll continue on Spiriva  Set up for pulmonary rehabilitation. Needs Prevnar vaccine.  Plan Prevnar Vaccine today  CT chest to follow lung nodule  Referral to pulmonary rehab.  Continue on Spiriva .  follow up Dr. Chase Caller in 6 months and As needed

## 2014-08-06 NOTE — Progress Notes (Signed)
Subjective:    Patient ID: [name] [name], male    DOB: Jul 09, 1940, 73 y.o.   MRN: [id number]  HPI    OV 06/22/2013  Chief Complaint  Patient presents with  . Pulmonary Consult    HFU per [name]  for COPD and SOB.    Dyspnea and COPD referral from Dr Aundra Dubin  74 year old male. Use to run 5-20 miles each day till 10-20 years ago.HE is ex smoker 80 pack; quit 20 years ago followed by weight gain. THen several health issues including MS and bladder cancer and diastolic cHF.  Past few years insidious onset dyspnea, progresive, moderate in intensity with some asocited cough maybe. Brought on for climbing 1 flight of stairs and relieved by rest. THere is currently no associatd orthopnea, edema, wheeze  Dyspnea relevant hx    Exertional dyspnea: Lexiscan myoview (8/09) with EF 57%, diaphragmatic attenuation but no ischemia or infarction. Lexiscan myoview (3/11): EF 68% with fixed inferior defect likely due to diaphragmatic attenuation and no ischemia (similar to 2009). Echo (4/11): EF 66-44%, mild diastolic dysfunction, moderate mitral regurgitation, mild pulmonary HTN (PASP 38 mmHg), normal RV size and systolic function. PFTs (3/11) with moderate obstructive defect. Echo (12/14) with EF 50-55%, inferior and apical septal hypokinesis, mild LVH, mild mitral regurgitation.   CAD: LHC (1/15) with serial 50% and 60% mid LCx stenoses, 50% proximal RCA stenosis, EF 55% on LV-gram, LVEDP 7 mmHg.    COPD noted: Prior heavy smoker. PFTs (3/11): FVC 87%, FEV1 73%, ratio 0.57, DLCO 75%, TLC 121%. Moderate obstructive defect.  - per DR Aundra Dubin note. RX spiriva with some help    CXR 12/06/12: ? Chronic ILD changes - my interpretation   Walking desaturation test today in the office June 22 2013:180 feet x3 laps on room air: Dropped to 89% at the end of the third lap but promptly improved to 92% when he rested. He did not have any complaints   09/04/13  Follow up COPD and test results  Pt  returns for follow up and to review recent test results.  Pt says overall he is doing well. No flare in cough or dyspnea.  He had a CT chest done 06/2013 that was neg for ILD . RML ndoularity and 6 mm nodule noted. A CT chest has been set up for later this month. Mild emphysema and bronchial wall thickening.  PFT today showed FEV1 72%, ratio 68, FVC 77%, no sign BD response, decreased diffusing capacity at 66%.  Decreased Mid flows 53% w/ +BD response.  On Spiriva daily  Denies significant cough or wheezing .  No chest pain,orthopnea, edema or fever.  Was a heavy smoker for many years.   11/2013 Follow up COPD -Gold 2  Patient returns for three-month follow-up for COPD He remains on Spiriva daily Feels breathing  Denies any flare of cough or wheezing. No emergency room or hospitalizations since last visit Patient has a right middle lobe nodularity and 6 mm nodule noted on CT chest that is being followed with serial follow-up. Last CT on September 24 showed similar appearance of the right middle lobe nodule   Patient denies any chest pain, orthopnea, PND, leg swelling, hemoptysis , or unintentional weight loss  Followed by Dr. Marin Comment for Bladder Cancer [name] .  Records requested from [facility] . Consent signed for care everywhere.   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 Went to grad school at [employer].   08/06/2014 Follow up :  COPD GOLD 2  Patient returns for a 9 month follow up. Says overall that he is doing okay but has shortness of breath with activity. Does push ups daily with no trouble, stretching at home.  Discussed pulmonary rehab,  he is agreeable to this. Most DOE with carrying heavy items, incline, steps, and long distance.  Spirometry today shows a slight decrease in FEV1 at 64%, ratio 72, FVC at 68%. He remains on Spiriva. He denies any cough, wheezing, chest pain, hemoptysis or unintentional weight loss orthopnea, PND, or increased leg swelling.  Patient has known pulmonary  nodules. CT chest in September showed similar nodularity. Subsequent PET scan in December was negative.     Past Medical History  Diagnosis Date  . MS (multiple sclerosis)   . Arthritis   . Diastolic dysfunction   . Right bundle branch block   . Brugada syndrome   . COPD (chronic obstructive pulmonary disease)   . GERD (gastroesophageal reflux disease)   . Depression     with bipolar tendencies  . Urinary retention with incomplete bladder emptying     receives botox injections and treatment for BPH through [facility]  . BPH (benign prostatic hyperplasia)   . CAD (coronary artery disease)     50% L circ, 50% RCA 1/15  . HLD (hyperlipidemia)   . HTN (hypertension)   . CHF (congestive heart failure)   . Cardiac hypertrophy   . Bladder cancer     [facility], Ta low grade papillary urothileal carcinoma      Review of Systems  Constitutional: Negative for fever and unexpected weight change.  HENT: Negative for congestion, dental problem, ear pain, nosebleeds, postnasal drip, rhinorrhea, sinus pressure, sneezing, sore throat and trouble swallowing.   Eyes: Negative for redness and itching.  Respiratory: Positive for shortness of breath. Negative for cough, chest tightness and wheezing.   Cardiovascular: Negative for palpitations and leg swelling.  Gastrointestinal: Negative for nausea and vomiting.  Genitourinary: Negative for dysuria.  Musculoskeletal: Negative for joint swelling.  Skin: Negative for rash.  Neurological: Negative for headaches.  Hematological: Does not bruise/bleed easily.  Psychiatric/Behavioral: Negative for dysphoric mood. The patient is not nervous/anxious.        Objective:   Physical Exam  Nursing note and vitals reviewed. Constitutional: He is oriented to person, place, and time. He appears well-developed and well-nourished. No distress.  HENT:  Head: Normocephalic and atraumatic.  Right Ear: External ear normal.  Left Ear: External ear normal.    Mouth/Throat: Oropharynx is clear and moist. No oropharyngeal exudate.  Eyes: Conjunctivae and EOM are normal. Pupils are equal, round, and reactive to light. Right eye exhibits no discharge. Left eye exhibits no discharge. No scleral icterus.  Neck: Normal range of motion. Neck supple. No JVD present. No tracheal deviation present. No thyromegaly present.  Cardiovascular: Normal rate, regular rhythm and intact distal pulses.  Exam reveals no gallop and no friction rub.   No murmur heard. Pulmonary/Chest: Effort normal and breath sounds normal. No respiratory distress. He has no wheezes. He has no rales. He exhibits no tenderness.  Abdominal: Soft. Bowel sounds are normal. He exhibits no distension and no mass. There is no tenderness. There is no rebound and no guarding.  Musculoskeletal: Normal range of motion. He exhibits no edema and no tenderness.  Lymphadenopathy:    He has no cervical adenopathy.  Neurological: He is alert and oriented to person, place, and time. He has normal reflexes. No cranial nerve deficit. Coordination normal.  Skin: Skin is warm and dry. No rash noted. He is not diaphoretic. No erythema. No pallor.  Psychiatric: He has a normal mood and affect. His behavior is normal. Judgment and thought content normal.          Assessment & Plan:

## 2014-08-06 NOTE — Telephone Encounter (Signed)
Patient called requesting prescription for HYDROcodone-acetaminophen (NORCO) 10-325 MG

## 2014-08-07 ENCOUNTER — Ambulatory Visit (INDEPENDENT_AMBULATORY_CARE_PROVIDER_SITE_OTHER)
Admission: RE | Admit: 2014-08-07 | Discharge: 2014-08-07 | Disposition: A | Discharge status: Home / Self Care | Payer: Medicare Other | Source: Ambulatory Visit | Attending: Adult Health | Admitting: Adult Health

## 2014-08-07 ENCOUNTER — Other Ambulatory Visit: Payer: Self-pay | Admitting: Adult Health

## 2014-08-07 DIAGNOSIS — J439 Emphysema, unspecified: Secondary | ICD-10-CM | POA: Diagnosis not present

## 2014-08-07 DIAGNOSIS — Z8546 Personal history of malignant neoplasm of prostate: Secondary | ICD-10-CM | POA: Diagnosis not present

## 2014-08-07 DIAGNOSIS — R911 Solitary pulmonary nodule: Secondary | ICD-10-CM

## 2014-08-07 DIAGNOSIS — Z8551 Personal history of malignant neoplasm of bladder: Secondary | ICD-10-CM | POA: Diagnosis not present

## 2014-08-07 DIAGNOSIS — R918 Other nonspecific abnormal finding of lung field: Secondary | ICD-10-CM | POA: Diagnosis not present

## 2014-08-07 NOTE — Progress Notes (Signed)
Quick Note:  Called and spoke with pt. Reviewed results and recs. Put order in future order for CT for 06/2015. Pt voiced understanding and had no further questions. ______

## 2014-09-26 ENCOUNTER — Telehealth: Payer: Self-pay | Admitting: Family Medicine

## 2014-09-26 MED ORDER — HYDROCODONE-ACETAMINOPHEN 10-325 MG PO TABS: 1.0000 | ORAL_TABLET | Freq: Four times a day (QID) | ORAL | Status: DC | PRN

## 2014-09-26 NOTE — Telephone Encounter (Signed)
Patient calling in for a refill on his HYDROcodone-acetaminophen (NORCO) 10-325 MG

## 2014-09-26 NOTE — Telephone Encounter (Signed)
?   OK to Refill  

## 2014-09-26 NOTE — Telephone Encounter (Signed)
ok 

## 2014-09-26 NOTE — Telephone Encounter (Signed)
RX printed, left up front and patient aware to pick up  

## 2014-09-29 ENCOUNTER — Other Ambulatory Visit: Payer: Self-pay | Admitting: Family Medicine

## 2014-10-02 DIAGNOSIS — G35 Multiple sclerosis: Secondary | ICD-10-CM | POA: Diagnosis not present

## 2014-10-02 DIAGNOSIS — N318 Other neuromuscular dysfunction of bladder: Secondary | ICD-10-CM | POA: Diagnosis not present

## 2014-10-02 DIAGNOSIS — N3941 Urge incontinence: Secondary | ICD-10-CM | POA: Diagnosis not present

## 2014-10-02 DIAGNOSIS — Z79899 Other long term (current) drug therapy: Secondary | ICD-10-CM | POA: Diagnosis not present

## 2014-10-02 DIAGNOSIS — N3281 Overactive bladder: Secondary | ICD-10-CM | POA: Diagnosis not present

## 2014-10-02 DIAGNOSIS — K5909 Other constipation: Secondary | ICD-10-CM | POA: Diagnosis not present

## 2014-10-02 DIAGNOSIS — C680 Malignant neoplasm of urethra: Secondary | ICD-10-CM | POA: Diagnosis not present

## 2014-10-05 ENCOUNTER — Other Ambulatory Visit: Payer: Self-pay | Admitting: Family Medicine

## 2014-10-07 ENCOUNTER — Other Ambulatory Visit: Payer: Self-pay | Admitting: Family Medicine

## 2014-10-30 DIAGNOSIS — C4491 Basal cell carcinoma of skin, unspecified: Secondary | ICD-10-CM | POA: Diagnosis not present

## 2014-10-30 DIAGNOSIS — Z85828 Personal history of other malignant neoplasm of skin: Secondary | ICD-10-CM | POA: Diagnosis not present

## 2014-11-18 ENCOUNTER — Ambulatory Visit (INDEPENDENT_AMBULATORY_CARE_PROVIDER_SITE_OTHER): Payer: Medicare Other | Admitting: Family Medicine

## 2014-11-18 DIAGNOSIS — Z23 Encounter for immunization: Secondary | ICD-10-CM | POA: Diagnosis not present

## 2014-11-25 DIAGNOSIS — Z86010 Personal history of colonic polyps: Secondary | ICD-10-CM | POA: Diagnosis not present

## 2014-11-25 DIAGNOSIS — K219 Gastro-esophageal reflux disease without esophagitis: Secondary | ICD-10-CM | POA: Diagnosis not present

## 2014-11-25 DIAGNOSIS — K5900 Constipation, unspecified: Secondary | ICD-10-CM | POA: Diagnosis not present

## 2014-12-06 ENCOUNTER — Telehealth: Payer: Self-pay | Admitting: Family Medicine

## 2014-12-06 MED ORDER — HYDROCODONE-ACETAMINOPHEN 10-325 MG PO TABS: 1.0000 | ORAL_TABLET | Freq: Four times a day (QID) | ORAL | Status: DC | PRN

## 2014-12-06 NOTE — Telephone Encounter (Signed)
Requesting refill on hydrocodone - Per [facility] ok to refill - rx printed, signed and given to pt's wife while in office.

## 2014-12-10 DIAGNOSIS — Z86010 Personal history of colonic polyps: Secondary | ICD-10-CM | POA: Diagnosis not present

## 2014-12-10 LAB — HM COLONOSCOPY: HM Colonoscopy: NORMAL

## 2014-12-12 ENCOUNTER — Encounter: Payer: Self-pay | Admitting: *Deleted

## 2014-12-12 ENCOUNTER — Encounter: Payer: Self-pay | Admitting: Family Medicine

## 2014-12-15 ENCOUNTER — Other Ambulatory Visit: Payer: Self-pay | Admitting: Cardiology

## 2015-01-09 ENCOUNTER — Telehealth: Payer: Self-pay | Admitting: Family Medicine

## 2015-01-09 MED ORDER — HYDROCODONE-ACETAMINOPHEN 10-325 MG PO TABS: 1.0000 | ORAL_TABLET | Freq: Four times a day (QID) | ORAL | Status: DC | PRN

## 2015-01-09 NOTE — Telephone Encounter (Signed)
ok 

## 2015-01-09 NOTE — Telephone Encounter (Signed)
RX printed, left up front and patient aware to pick up tomorrow am

## 2015-01-09 NOTE — Telephone Encounter (Signed)
?   OK to Refill  

## 2015-01-09 NOTE — Telephone Encounter (Signed)
Patient is calling to get rx for his hydrocodone  [phone]

## 2015-01-14 ENCOUNTER — Other Ambulatory Visit: Payer: Self-pay | Admitting: Cardiology

## 2015-01-21 ENCOUNTER — Other Ambulatory Visit: Payer: Self-pay | Admitting: Family Medicine

## 2015-01-21 ENCOUNTER — Other Ambulatory Visit: Payer: Medicare Other

## 2015-01-21 DIAGNOSIS — Z79899 Other long term (current) drug therapy: Secondary | ICD-10-CM

## 2015-01-21 DIAGNOSIS — R7303 Prediabetes: Secondary | ICD-10-CM

## 2015-01-21 DIAGNOSIS — I10 Essential (primary) hypertension: Secondary | ICD-10-CM

## 2015-01-21 DIAGNOSIS — E785 Hyperlipidemia, unspecified: Secondary | ICD-10-CM

## 2015-01-27 ENCOUNTER — Encounter: Payer: Self-pay | Admitting: Family Medicine

## 2015-01-27 ENCOUNTER — Ambulatory Visit (INDEPENDENT_AMBULATORY_CARE_PROVIDER_SITE_OTHER): Payer: Medicare Other | Admitting: Family Medicine

## 2015-01-27 VITALS — BP 100/58 | HR 88 | Temp 97.8°F | Resp 20 | Ht 71.0 in | Wt 223.0 lb

## 2015-01-27 DIAGNOSIS — E119 Type 2 diabetes mellitus without complications: Secondary | ICD-10-CM

## 2015-01-27 DIAGNOSIS — M791 Myalgia, unspecified site: Secondary | ICD-10-CM

## 2015-01-27 LAB — HEMOGLOBIN A1C
Hgb A1c MFr Bld: 6.4 % — ABNORMAL HIGH (ref <5.7)
MEAN PLASMA GLUCOSE: 137 mg/dL — AB (ref <117)

## 2015-01-27 LAB — CBC WITH DIFFERENTIAL/PLATELET
Basophils Absolute: 0.1 10*3/uL (ref 0.0–0.1)
Basophils Relative: 1 % (ref 0–1)
EOS PCT: 6 % — AB (ref 0–5)
Eosinophils Absolute: 0.7 10*3/uL (ref 0.0–0.7)
HEMATOCRIT: 42.7 % (ref 39.0–52.0)
Hemoglobin: 14.1 g/dL (ref 13.0–17.0)
LYMPHS ABS: 2.3 10*3/uL (ref 0.7–4.0)
LYMPHS PCT: 20 % (ref 12–46)
MCH: 31.1 pg (ref 26.0–34.0)
MCHC: 33 g/dL (ref 30.0–36.0)
MCV: 94.3 fL (ref 78.0–100.0)
MPV: 9.5 fL (ref 8.6–12.4)
Monocytes Absolute: 0.9 10*3/uL (ref 0.1–1.0)
Monocytes Relative: 8 % (ref 3–12)
Neutro Abs: 7.3 10*3/uL (ref 1.7–7.7)
Neutrophils Relative %: 65 % (ref 43–77)
Platelets: 446 10*3/uL — ABNORMAL HIGH (ref 150–400)
RBC: 4.53 MIL/uL (ref 4.22–5.81)
RDW: 13.4 % (ref 11.5–15.5)
WBC: 11.3 10*3/uL — ABNORMAL HIGH (ref 4.0–10.5)

## 2015-01-27 LAB — COMPLETE METABOLIC PANEL WITH GFR
ALT: 17 U/L (ref 9–46)
AST: 17 U/L (ref 10–35)
Albumin: 4.1 g/dL (ref 3.6–5.1)
Alkaline Phosphatase: 90 U/L (ref 40–115)
BILIRUBIN TOTAL: 0.3 mg/dL (ref 0.2–1.2)
BUN: 25 mg/dL (ref 7–25)
CALCIUM: 9.7 mg/dL (ref 8.6–10.3)
CO2: 25 mmol/L (ref 20–31)
Chloride: 99 mmol/L (ref 98–110)
Creat: 1.47 mg/dL — ABNORMAL HIGH (ref 0.70–1.18)
GFR, EST AFRICAN AMERICAN: 54 mL/min — AB (ref >=60)
GFR, Est Non African American: 47 mL/min — ABNORMAL LOW (ref >=60)
Glucose, Bld: 101 mg/dL — ABNORMAL HIGH (ref 70–99)
POTASSIUM: 5.8 mmol/L — AB (ref 3.5–5.3)
Sodium: 137 mmol/L (ref 135–146)
Total Protein: 7.1 g/dL (ref 6.1–8.1)

## 2015-01-27 LAB — CK: Total CK: 117 U/L (ref 7–232)

## 2015-01-27 LAB — C-REACTIVE PROTEIN: CRP: 0.7 mg/dL — ABNORMAL HIGH (ref <0.60)

## 2015-01-27 MED ORDER — PREDNISONE 20 MG PO TABS: ORAL_TABLET | ORAL | Status: DC

## 2015-01-27 NOTE — Progress Notes (Signed)
Subjective:    Patient ID: [name] [name], male    DOB: [birth year], 75 y.o.   MRN: [id number]  HPI Patient presents with the sudden onset of diffuse and severe myalgias approximately 1 week ago. He states that he hurts all over his body. There has not been an inciting event.  He denies any fevers or chills or rashes. He denies any cough, chest pain, shortness of breath, or sore throat. He has not been in the forest or bitten by a tick recently. He does take Lipitor for hyperlipidemia. He also has a history of underlying multiple sclerosis. However he denies any new onset neuropathy or weakness. However he hurts severely in his shoulders, in his lower backs, and his hips, in his gluteus, and in his legs and knees. However he is not able to 10.1 joint that hurts worse than another or one muscle group that hurts worse than another. He has no family history of autoimmune diseases that he is aware of. He has had no recent episode of strep throat. He's had no recent travel outside the country. Past Medical History  Diagnosis Date  . MS (multiple sclerosis) ([place])   . Arthritis   . Diastolic dysfunction   . Right bundle branch block   . Brugada syndrome   . COPD (chronic obstructive pulmonary disease) ([place])   . GERD (gastroesophageal reflux disease)   . Depression     with bipolar tendencies  . Urinary retention with incomplete bladder emptying     receives botox injections and treatment for BPH through [facility]  . BPH (benign prostatic hyperplasia)   . CAD (coronary artery disease)     50% L circ, 50% RCA 1/15  . HLD (hyperlipidemia)   . HTN (hypertension)   . CHF (congestive heart failure) ([place])   . Cardiac hypertrophy   . Bladder cancer (HCC)     [facility], [facility] low grade papillary urothileal carcinoma   Past Surgical History  Procedure Laterality Date  . Total knee arthroplasty Right   . Transurethral resection of prostate    . Tonsillectomy    . Appendectomy    . Rotator cuff repair    .  Hemorrhoid surgery    . Left heart catheterization with coronary angiogram N/A 01/05/2013    Procedure: LEFT HEART CATHETERIZATION WITH CORONARY ANGIOGRAM;  Surgeon: Larey Dresser, MD;  Location: [facility] CATH LAB;  Service: Cardiovascular;  Laterality: N/A;   Current Outpatient Prescriptions on File Prior to Visit  Medication Sig Dispense Refill  . amLODipine (NORVASC) 5 MG tablet Take 1 tablet (5 mg total) by mouth daily. 90 tablet 0  . ARIPiprazole (ABILIFY) 5 MG tablet TAKE 1 TABLET DAILY 90 tablet 3  . ARIPiprazole (ABILIFY) 5 MG tablet TAKE 1 TABLET BY MOUTH EVERY DAY 30 tablet 5  . aspirin EC 81 MG tablet Take 81 mg by mouth every other day.     Marland Kitchen atorvastatin (LIPITOR) 20 MG tablet TAKE 1 TABLET DAILY 90 tablet 3  . bisoprolol (ZEBETA) 5 MG tablet TAKE ONE-HALF (1/2) TABLET DAILY 45 tablet 2  . cyclobenzaprine (FLEXERIL) 10 MG tablet TAKE 1 TABLET BY MOUTH 3 TIMES A DAY AS NEEDED FOR MUSCLE SPASMS 30 tablet 2  . furosemide (LASIX) 40 MG tablet TAKE 1 TABLET TWICE A DAY 60 tablet 0  . HYDROcodone-acetaminophen (NORCO) 10-325 MG tablet Take 1 tablet by mouth every 6 (six) hours as needed. 100 tablet 0  . losartan (COZAAR) 50 MG tablet TAKE 1 TABLET DAILY  90 tablet 2  . NAPROXEN PO Take 600 mg by mouth daily.    Marland Kitchen oxybutynin (DITROPAN) 5 MG tablet TAKE 1 TABLET BY MOUTH EVERY 8 HOURS AS NEEDED FOR BLADDER SPASMS  3  . PROAIR HFA 108 (90 BASE) MCG/ACT inhaler USE 1 TO 2 INHALATIONS EVERY 6 HOURS AS NEEDED FOR WHEEZING OR SHORTNESS OF BREATH 25.5 g 4  . rOPINIRole (REQUIP) 1 MG tablet Take 1 mg by mouth at bedtime.    Marland Kitchen SPIRIVA HANDIHALER 18 MCG inhalation capsule INHALE THE CONTENTS OF 1 CAPSULE WITH 2         INHALATIONS ONCE DAILY AS DIRECTED 1 capsule 11  . venlafaxine XR (EFFEXOR-XR) 150 MG 24 hr capsule Take 1 capsule twice a day.    . venlafaxine XR (EFFEXOR-XR) 150 MG 24 hr capsule TAKE 1 CAPSULE TWICE A DAY 180 capsule 3   No current facility-administered medications on file prior to  visit.   Allergies  Allergen Reactions  . Acetylcholine     unknown  . Alcohol-Sulfur [Sulfur]     unknown  . Amitriptyline     unknown  . Bupivacaine     unknown  . Clomipramine Hcl     unknown  . Cocaine     unknown  . Desipramine     unknown  . Ergonovine     unknown  . Flecainide     unknown  . Lithium     unknown  . Loxapine     unknown  . Nortriptyline     unknown  . Oxcarbazepine     unknown  . Procainamide     unknown  . Procaine     unknwon  . Propafenone     unknown  . Propofol     unknown  . Trifluoperazine     unknown   Social History   Social History  . Marital Status: Married    Spouse Name: N/A  . Number of Children: N/A  . Years of Education: N/A   Occupational History  . retired    Social History Main Topics  . Smoking status: Former Smoker -- 2.00 packs/day for 40 years    Types: Cigarettes    Quit date: 01/04/1993  . Smokeless tobacco: Former Systems developer    Types: Chew  . Alcohol Use: Yes     Comment: 6 pack/day  . Drug Use: Not on file  . Sexual Activity: Not on file   Other Topics Concern  . Not on file   Social History Narrative      Review of Systems  All other systems reviewed and are negative.      Objective:   Physical Exam  Constitutional: He appears well-developed and well-nourished. No distress.  HENT:  Right Ear: External ear normal.  Left Ear: External ear normal.  Nose: Nose normal.  Mouth/Throat: Oropharynx is clear and moist. No oropharyngeal exudate.  Eyes: Conjunctivae are normal. Right eye exhibits no discharge. Left eye exhibits no discharge.  Neck: Neck supple. No thyromegaly present.  Cardiovascular: Normal rate, regular rhythm and normal heart sounds.   No murmur heard. Pulmonary/Chest: Effort normal and breath sounds normal. No respiratory distress. He has no wheezes. He has no rales.  Abdominal: Soft. Bowel sounds are normal. He exhibits no distension. There is no tenderness. There is no  rebound and no guarding.  Musculoskeletal:       Right shoulder: He exhibits tenderness. He exhibits normal range of motion.  Left shoulder: He exhibits tenderness. He exhibits normal range of motion.       Right hip: He exhibits tenderness. He exhibits normal range of motion and normal strength.       Left hip: He exhibits tenderness. He exhibits normal range of motion and normal strength.       Right knee: He exhibits normal range of motion. Tenderness found. Medial joint line and lateral joint line tenderness noted.       Left knee: He exhibits normal range of motion. Tenderness found. Medial joint line and lateral joint line tenderness noted.       Lumbar back: He exhibits decreased range of motion and tenderness.  Lymphadenopathy:    He has no cervical adenopathy.  Skin: No rash noted. He is not diaphoretic. No erythema.  Psychiatric: He has a normal mood and affect. His behavior is normal. Judgment and thought content normal.  Vitals reviewed.         Assessment & Plan:  Myalgia - Plan: CBC with Differential/Platelet, COMPLETE METABOLIC PANEL WITH GFR, Sedimentation rate, CK, ANA, predniSONE (DELTASONE) 20 MG tablet, C-reactive protein  Type II diabetes mellitus, well controlled ([place]) - Plan: Hemoglobin A1c  Differential diagnosis includes statin-induced myopathy, autoimmune diseases such as lupus, polymyalgia rheumatica, myositis, infections although the patient has no symptoms of any systemic illness, fibromyalgia. I've asked the patient to discontinue Lipitor. I will check lab work to evaluate for autoimmune diseases including a sedimentation rate, CK, CRP, and an ANA. I will place the patient temporarily on a prednisone taper pack to see if his symptoms will improve. Recheck later this week or sooner if worse

## 2015-01-28 LAB — ANA: Anti Nuclear Antibody(ANA): NEGATIVE

## 2015-01-28 LAB — SEDIMENTATION RATE: SED RATE: 38 mm/h — AB (ref 0–20)

## 2015-02-10 ENCOUNTER — Ambulatory Visit: Payer: TRICARE For Life (TFL) | Admitting: Internal Medicine

## 2015-02-15 ENCOUNTER — Other Ambulatory Visit: Payer: Self-pay | Admitting: Family Medicine

## 2015-03-03 ENCOUNTER — Telehealth: Payer: Self-pay | Admitting: Family Medicine

## 2015-03-03 NOTE — Telephone Encounter (Signed)
Ok, ntbs when he gets back to discuss weaning on prednisone.

## 2015-03-03 NOTE — Telephone Encounter (Signed)
How much pred? LOV we just gave him a taper pack

## 2015-03-03 NOTE — Telephone Encounter (Signed)
Pt is in a lot of pain and requests a refill of Prednisone until he gets back to [place] from [facility].  [facility] on [address] in [place] Ph: [phone]

## 2015-03-04 MED ORDER — PREDNISONE 10 MG PO TABS: 10.0000 mg | ORAL_TABLET | Freq: Every day | ORAL | Status: DC

## 2015-03-04 NOTE — Telephone Encounter (Signed)
Medication called/sent to requested pharmacy and pt's wife aware via vm to schedule appt once returning to [facility]. Pharmacy is actually in [facility] not [place].

## 2015-03-04 NOTE — Telephone Encounter (Signed)
Start 10 mg po qday until seen.

## 2015-03-07 ENCOUNTER — Ambulatory Visit: Payer: Medicare Other | Admitting: Family Medicine

## 2015-03-15 ENCOUNTER — Other Ambulatory Visit: Payer: Self-pay | Admitting: Cardiology

## 2015-03-19 ENCOUNTER — Other Ambulatory Visit: Payer: Self-pay | Admitting: Family Medicine

## 2015-03-21 ENCOUNTER — Ambulatory Visit: Payer: Medicare Other | Admitting: Family Medicine

## 2015-03-23 ENCOUNTER — Other Ambulatory Visit: Payer: Self-pay | Admitting: Cardiology

## 2015-03-25 ENCOUNTER — Telehealth: Payer: Self-pay | Admitting: Family Medicine

## 2015-03-25 NOTE — Telephone Encounter (Signed)
?   OK to Refill  

## 2015-03-25 NOTE — Telephone Encounter (Signed)
Pt's wife is requesting a refill of Hydrocodone. She states that he is in severe pain [name] [phone]

## 2015-03-25 NOTE — Telephone Encounter (Signed)
ok 

## 2015-03-27 MED ORDER — HYDROCODONE-ACETAMINOPHEN 10-325 MG PO TABS: 1.0000 | ORAL_TABLET | Freq: Four times a day (QID) | ORAL | Status: DC | PRN

## 2015-03-27 NOTE — Telephone Encounter (Signed)
RX printed, left up front and patient aware to pick up  

## 2015-03-28 ENCOUNTER — Ambulatory Visit: Payer: TRICARE For Life (TFL) | Admitting: Family Medicine

## 2015-03-28 ENCOUNTER — Ambulatory Visit: Payer: TRICARE For Life (TFL) | Admitting: Internal Medicine

## 2015-03-28 ENCOUNTER — Encounter: Payer: Self-pay | Admitting: Family Medicine

## 2015-03-28 ENCOUNTER — Ambulatory Visit (INDEPENDENT_AMBULATORY_CARE_PROVIDER_SITE_OTHER): Payer: Medicare Other | Admitting: Family Medicine

## 2015-03-28 VITALS — BP 110/62 | HR 84 | Temp 97.8°F | Resp 18 | Ht 71.0 in | Wt 221.0 lb

## 2015-03-28 DIAGNOSIS — M791 Myalgia, unspecified site: Secondary | ICD-10-CM

## 2015-03-28 DIAGNOSIS — F3130 Bipolar disorder, current episode depressed, mild or moderate severity, unspecified: Secondary | ICD-10-CM

## 2015-03-28 DIAGNOSIS — E119 Type 2 diabetes mellitus without complications: Secondary | ICD-10-CM | POA: Diagnosis not present

## 2015-03-28 NOTE — Progress Notes (Signed)
Subjective:    Patient ID: [name] [name], male    DOB: Jun 06, 1940, 75 y.o.   MRN: [id number]  HPI 01/27/15 Patient presents with the sudden onset of diffuse and severe myalgias approximately 1 week ago. He states that he hurts all over his body. There has not been an inciting event.  He denies any fevers or chills or rashes. He denies any cough, chest pain, shortness of breath, or sore throat. He has not been in the forest or bitten by a tick recently. He does take Lipitor for hyperlipidemia. He also has a history of underlying multiple sclerosis. However he denies any new onset neuropathy or weakness. However he hurts severely in his shoulders, in his lower backs, and his hips, in his gluteus, and in his legs and knees. However he is not able to 10.1 joint that hurts worse than another or one muscle group that hurts worse than another. He has no family history of autoimmune diseases that he is aware of. He has had no recent episode of strep throat. He's had no recent travel outside the country.  At that time, my plan was: Differential diagnosis includes statin-induced myopathy, autoimmune diseases such as lupus, polymyalgia rheumatica, myositis, infections although the patient has no symptoms of any systemic illness, fibromyalgia. I've asked the patient to discontinue Lipitor. I will check lab work to evaluate for autoimmune diseases including a sedimentation rate, CK, CRP, and an ANA. I will place the patient temporarily on a prednisone taper pack to see if his symptoms will improve. Recheck later this week or sooner if worse. Office Visit on 01/27/2015  Component Date Value Ref Range Status  . WBC 01/27/2015 11.3* 4.0 - 10.5 K/uL Final  . RBC 01/27/2015 4.53  4.22 - 5.81 MIL/uL Final  . Hemoglobin 01/27/2015 14.1  13.0 - 17.0 g/dL Final  . HCT 01/27/2015 42.7  39.0 - 52.0 % Final  . MCV 01/27/2015 94.3  78.0 - 100.0 fL Final  . MCH 01/27/2015 31.1  26.0 - 34.0 pg Final  . MCHC 01/27/2015 33.0   30.0 - 36.0 g/dL Final  . RDW 01/27/2015 13.4  11.5 - 15.5 % Final  . Platelets 01/27/2015 446* 150 - 400 K/uL Final  . MPV 01/27/2015 9.5  8.6 - 12.4 fL Final  . Neutrophils Relative % 01/27/2015 65  43 - 77 % Final  . Neutro Abs 01/27/2015 7.3  1.7 - 7.7 K/uL Final  . Lymphocytes Relative 01/27/2015 20  12 - 46 % Final  . Lymphs Abs 01/27/2015 2.3  0.7 - 4.0 K/uL Final  . Monocytes Relative 01/27/2015 8  3 - 12 % Final  . Monocytes Absolute 01/27/2015 0.9  0.1 - 1.0 K/uL Final  . Eosinophils Relative 01/27/2015 6* 0 - 5 % Final  . Eosinophils Absolute 01/27/2015 0.7  0.0 - 0.7 K/uL Final  . Basophils Relative 01/27/2015 1  0 - 1 % Final  . Basophils Absolute 01/27/2015 0.1  0.0 - 0.1 K/uL Final  . Smear Review 01/27/2015 Criteria for review not met   Final  . Sodium 01/27/2015 137  135 - 146 mmol/L Final  . Potassium 01/27/2015 5.8* 3.5 - 5.3 mmol/L Final   No visible hemolysis.  . Chloride 01/27/2015 99  98 - 110 mmol/L Final  . CO2 01/27/2015 25  20 - 31 mmol/L Final  . Glucose, Bld 01/27/2015 101* 70 - 99 mg/dL Final  . BUN 01/27/2015 25  7 - 25 mg/dL Final  . Creat 01/27/2015  1.47* 0.70 - 1.18 mg/dL Final  . Total Bilirubin 01/27/2015 0.3  0.2 - 1.2 mg/dL Final  . Alkaline Phosphatase 01/27/2015 90  40 - 115 U/L Final  . AST 01/27/2015 17  10 - 35 U/L Final  . ALT 01/27/2015 17  9 - 46 U/L Final  . Total Protein 01/27/2015 7.1  6.1 - 8.1 g/dL Final  . Albumin 01/27/2015 4.1  3.6 - 5.1 g/dL Final  . Calcium 01/27/2015 9.7  8.6 - 10.3 mg/dL Final  . GFR, Est African American 01/27/2015 54* >=60 mL/min Final  . GFR, Est Non African American 01/27/2015 47* >=60 mL/min Final   Comment:   The estimated GFR is a calculation valid for adults (>=37 years old) that uses the CKD-EPI algorithm to adjust for age and sex. It is   not to be used for children, pregnant women, hospitalized patients,    patients on dialysis, or with rapidly changing kidney function. According to the NKDEP,  eGFR >89 is normal, 60-89 shows mild impairment, 30-59 shows moderate impairment, 15-29 shows severe impairment and <15 is ESRD.     Marland Kitchen Hgb A1c MFr Bld 01/27/2015 6.4* <5.7 % Final   Comment:                                                                        According to the ADA Clinical Practice Recommendations for 2011, when HbA1c is used as a screening test:     >=6.5%   Diagnostic of Diabetes Mellitus            (if abnormal result is confirmed)   5.7-6.4%   Increased risk of developing Diabetes Mellitus   References:Diagnosis and Classification of Diabetes Mellitus,Diabetes WERX,5400,86(PYPPJ 1):S62-S69 and Standards of Medical Care in         Diabetes - 2011,Diabetes KDTO,6712,45 (Suppl 1):S11-S61.     . Mean Plasma Glucose 01/27/2015 137* <117 mg/dL Final  . Sed Rate 01/27/2015 38* 0 - 20 mm/hr Final  . Total CK 01/27/2015 117  7 - 232 U/L Final  . Anit Nuclear Antibody(ANA) 01/27/2015 NEG  NEGATIVE Final  . CRP 01/27/2015 0.7* <0.60 mg/dL Final   03/28/15 As soon as the patient took the prednisone, the diffuse pain went completely away. However he also discontinue Lipitor at approximately the same time. After he took the 6 day taper pack of prednisone, the pain stayed away. One month later he developed some mild pain while in [place] and was given some additional prednisone but at a reduced dose of 10 mg a day. He is only taking 4 pills of Ativan last 4 weeks. His pain continues to remain much better. Therefore I do not believe that this is an autoimmune disease. Rather I think he may have been having a combination of arthritis as well as myalgias due to his statin. However he has been off his statin medication now for 2 months and is due to recheck his cholesterol to see how bad it has become. Regarding his bipolar, the patient continues to do extremely well on Abilify. Aside from the weight gain he is having no complications. Both he and his wife wish to continue it. His mood  swings, anger management, and general follow-up and assess  have all improved. Past Medical History  Diagnosis Date  . MS (multiple sclerosis) ([place])   . Arthritis   . Diastolic dysfunction   . Right bundle branch block   . Brugada syndrome   . COPD (chronic obstructive pulmonary disease) ([place])   . GERD (gastroesophageal reflux disease)   . Depression     with bipolar tendencies  . Urinary retention with incomplete bladder emptying     receives botox injections and treatment for BPH through [facility]  . BPH (benign prostatic hyperplasia)   . CAD (coronary artery disease)     50% L circ, 50% RCA 1/15  . HLD (hyperlipidemia)   . HTN (hypertension)   . CHF (congestive heart failure) ([place])   . Cardiac hypertrophy   . Bladder cancer (HCC)     [facility], [facility] low grade papillary urothileal carcinoma   Past Surgical History  Procedure Laterality Date  . Total knee arthroplasty Right   . Transurethral resection of prostate    . Tonsillectomy    . Appendectomy    . Rotator cuff repair    . Hemorrhoid surgery    . Left heart catheterization with coronary angiogram N/A 01/05/2013    Procedure: LEFT HEART CATHETERIZATION WITH CORONARY ANGIOGRAM;  Surgeon: Larey Dresser, MD;  Location: [facility] CATH LAB;  Service: Cardiovascular;  Laterality: N/A;   Current Outpatient Prescriptions on File Prior to Visit  Medication Sig Dispense Refill  . amLODipine (NORVASC) 5 MG tablet TAKE 1 TABLET DAILY (PLEASE CALL AND SCHEDULE A ONE YEAR FOLLOW UP APPOINTMENT) 60 tablet 0  . ARIPiprazole (ABILIFY) 5 MG tablet TAKE 1 TABLET BY MOUTH EVERY DAY 30 tablet 5  . aspirin EC 81 MG tablet Take 81 mg by mouth every other day.     . bisoprolol (ZEBETA) 5 MG tablet TAKE ONE-HALF (1/2) TABLET DAILY 45 tablet 1  . cyclobenzaprine (FLEXERIL) 10 MG tablet TAKE 1 TABLET BY MOUTH 3 TIMES A DAY AS NEEDED FOR MUSCLE SPASMS 30 tablet 2  . furosemide (LASIX) 40 MG tablet TAKE 1 TABLET TWICE A DAY 60 tablet 0  . HYDROcodone-acetaminophen  (NORCO) 10-325 MG tablet Take 1 tablet by mouth every 6 (six) hours as needed. 100 tablet 0  . NAPROXEN PO Take 600 mg by mouth daily.    Marland Kitchen oxybutynin (DITROPAN) 5 MG tablet TAKE 1 TABLET BY MOUTH EVERY 8 HOURS AS NEEDED FOR BLADDER SPASMS  3  . predniSONE (DELTASONE) 10 MG tablet Take 1 tablet (10 mg total) by mouth daily with breakfast. 30 tablet 0  . PROAIR HFA 108 (90 BASE) MCG/ACT inhaler USE 1 TO 2 INHALATIONS EVERY 6 HOURS AS NEEDED FOR WHEEZING OR SHORTNESS OF BREATH 25.5 g 4  . rOPINIRole (REQUIP) 1 MG tablet TAKE 1 TABLET AT BEDTIME 90 tablet 2  . SPIRIVA HANDIHALER 18 MCG inhalation capsule INHALE THE CONTENTS OF 1 CAPSULE WITH 2         INHALATIONS ONCE DAILY AS DIRECTED 1 capsule 11  . venlafaxine XR (EFFEXOR-XR) 150 MG 24 hr capsule Take 1 capsule twice a day.    . venlafaxine XR (EFFEXOR-XR) 150 MG 24 hr capsule TAKE 1 CAPSULE TWICE A DAY 180 capsule 3  . atorvastatin (LIPITOR) 20 MG tablet TAKE 1 TABLET DAILY (Patient not taking: Reported on 03/28/2015) 90 tablet 0  . losartan (COZAAR) 50 MG tablet TAKE 1 TABLET DAILY (Patient not taking: Reported on 03/28/2015) 90 tablet 4   No current facility-administered medications on file prior to visit.  Allergies  Allergen Reactions  . Acetylcholine     unknown  . Alcohol-Sulfur [Sulfur]     unknown  . Amitriptyline     unknown  . Bupivacaine     unknown  . Clomipramine Hcl     unknown  . Cocaine     unknown  . Desipramine     unknown  . Ergonovine     unknown  . Flecainide     unknown  . Lithium     unknown  . Loxapine     unknown  . Nortriptyline     unknown  . Oxcarbazepine     unknown  . Procainamide     unknown  . Procaine     unknwon  . Propafenone     unknown  . Propofol     unknown  . Trifluoperazine     unknown   Social History   Social History  . Marital Status: Married    Spouse Name: N/A  . Number of Children: N/A  . Years of Education: N/A   Occupational History  . retired    Social  History Main Topics  . Smoking status: Former Smoker -- 2.00 packs/day for 40 years    Types: Cigarettes    Quit date: 01/04/1993  . Smokeless tobacco: Former Systems developer    Types: Chew  . Alcohol Use: Yes     Comment: 6 pack/day  . Drug Use: Not on file  . Sexual Activity: Not on file   Other Topics Concern  . Not on file   Social History Narrative      Review of Systems  All other systems reviewed and are negative.      Objective:   Physical Exam  Constitutional: He appears well-developed and well-nourished. No distress.  HENT:  Right Ear: External ear normal.  Left Ear: External ear normal.  Nose: Nose normal.  Mouth/Throat: Oropharynx is clear and moist. No oropharyngeal exudate.  Eyes: Conjunctivae are normal. Right eye exhibits no discharge. Left eye exhibits no discharge.  Neck: Neck supple. No thyromegaly present.  Cardiovascular: Normal rate, regular rhythm and normal heart sounds.   No murmur heard. Pulmonary/Chest: Effort normal and breath sounds normal. No respiratory distress. He has no wheezes. He has no rales.  Abdominal: Soft. Bowel sounds are normal. He exhibits no distension. There is no tenderness. There is no rebound and no guarding.  Musculoskeletal:       Right shoulder: He exhibits tenderness. He exhibits normal range of motion.       Left shoulder: He exhibits tenderness. He exhibits normal range of motion.       Right hip: He exhibits normal range of motion and normal strength.       Left hip: He exhibits normal range of motion and normal strength.       Right knee: He exhibits normal range of motion.       Left knee: He exhibits normal range of motion.       Lumbar back: He exhibits normal range of motion.  Lymphadenopathy:    He has no cervical adenopathy.  Skin: No rash noted. He is not diaphoretic. No erythema.  Psychiatric: He has a normal mood and affect. His behavior is normal. Judgment and thought content normal.  Vitals  reviewed.         Assessment & Plan:  Myalgia  Bipolar disorder, most recent episode depressed, remission status unspecified  Type II diabetes mellitus, well controlled ([place]) - Plan: CBC  with Differential/Platelet, COMPLETE METABOLIC PANEL WITH GFR, Lipid panel  I believe the myalgias were not due to an autoimmune disease but will rather due to osteoarthritis in multiple joints coupled with myalgias secondary to his statin medication. He seems much better and he is not on prednisone. Therefore I will treat the majority of his myalgias to his statin. I will recheck a fasting lipid panel to determine how bad his cholesterol has become. I will also monitor his blood sugar. We will be very cautious about resuming a statin in the future. His bipolar seems well controlled. I will make no changes to his Abilify dosage.

## 2015-03-31 ENCOUNTER — Other Ambulatory Visit: Payer: Medicare Other

## 2015-03-31 DIAGNOSIS — M7542 Impingement syndrome of left shoulder: Secondary | ICD-10-CM | POA: Diagnosis not present

## 2015-03-31 DIAGNOSIS — M25512 Pain in left shoulder: Secondary | ICD-10-CM | POA: Diagnosis not present

## 2015-03-31 DIAGNOSIS — E785 Hyperlipidemia, unspecified: Secondary | ICD-10-CM

## 2015-03-31 DIAGNOSIS — Z79899 Other long term (current) drug therapy: Secondary | ICD-10-CM

## 2015-03-31 DIAGNOSIS — I10 Essential (primary) hypertension: Secondary | ICD-10-CM | POA: Diagnosis not present

## 2015-03-31 DIAGNOSIS — R7309 Other abnormal glucose: Secondary | ICD-10-CM | POA: Diagnosis not present

## 2015-03-31 DIAGNOSIS — R7303 Prediabetes: Secondary | ICD-10-CM | POA: Diagnosis not present

## 2015-03-31 LAB — LIPID PANEL
CHOLESTEROL: 158 mg/dL (ref 125–200)
HDL: 48 mg/dL (ref >=40)
LDL Cholesterol: 76 mg/dL (ref <130)
TRIGLYCERIDES: 170 mg/dL — AB (ref <150)
Total CHOL/HDL Ratio: 3.3 Ratio (ref <=5.0)
VLDL: 34 mg/dL — ABNORMAL HIGH (ref <30)

## 2015-03-31 LAB — HEMOGLOBIN A1C
Hgb A1c MFr Bld: 6.4 % — ABNORMAL HIGH (ref <5.7)
Mean Plasma Glucose: 137 mg/dL

## 2015-03-31 LAB — COMPLETE METABOLIC PANEL WITH GFR
ALT: 18 U/L (ref 9–46)
AST: 19 U/L (ref 10–35)
Albumin: 4.4 g/dL (ref 3.6–5.1)
Alkaline Phosphatase: 93 U/L (ref 40–115)
BILIRUBIN TOTAL: 0.4 mg/dL (ref 0.2–1.2)
BUN: 19 mg/dL (ref 7–25)
CALCIUM: 9.5 mg/dL (ref 8.6–10.3)
CO2: 27 mmol/L (ref 20–31)
Chloride: 100 mmol/L (ref 98–110)
Creat: 1.12 mg/dL (ref 0.70–1.18)
GFR, EST AFRICAN AMERICAN: 75 mL/min (ref >=60)
GFR, Est Non African American: 65 mL/min (ref >=60)
Glucose, Bld: 92 mg/dL (ref 70–99)
Potassium: 4.8 mmol/L (ref 3.5–5.3)
Sodium: 138 mmol/L (ref 135–146)
TOTAL PROTEIN: 7.1 g/dL (ref 6.1–8.1)

## 2015-04-03 ENCOUNTER — Encounter: Payer: Self-pay | Admitting: Internal Medicine

## 2015-04-03 ENCOUNTER — Ambulatory Visit (INDEPENDENT_AMBULATORY_CARE_PROVIDER_SITE_OTHER): Payer: Medicare Other | Admitting: Internal Medicine

## 2015-04-03 VITALS — BP 148/96 | HR 80 | Ht 71.0 in | Wt 220.4 lb

## 2015-04-03 DIAGNOSIS — R911 Solitary pulmonary nodule: Secondary | ICD-10-CM | POA: Diagnosis not present

## 2015-04-03 DIAGNOSIS — J439 Emphysema, unspecified: Secondary | ICD-10-CM

## 2015-04-03 NOTE — Patient Instructions (Addendum)
ICD-9-CM ICD-10-CM   1. Lung nodule 793.11 R91.1   2. Pulmonary emphysema, unspecified emphysema type ([place]) 492.8 J43.9    Lung nodule  Do ct chest wo contrast June/july 2017   COPD - stable  - continue albuterol as needed  - check alpha 1 genetic test for copd   Followup  -= June/July 2017 after CT  - CAT score at followup  - discuss low dose lung cancer screen at followup if lung nodule stable

## 2015-04-03 NOTE — Progress Notes (Signed)
Subjective:     Patient ID: [name] [name], male   DOB: 09-04-1940, 75 y.o.   MRN: [id number]  HPI   OV 04/03/2015  Chief Complaint  Patient presents with  . Follow-up    Pt states his breathing is doing well. Pt denies SOB, cough, wheezing, CP/tightness.     Follow-up mild to moderate COPD. He has multiple medical problems. Last seen by my nurse practitioner several months ago. He also has right middle lobe nodule which she follows up 6 mm present since summer 2015. Most recent CT chest August 2016 show stability of the nodule. Currently he denies any symptoms. He says he only uses albuterol as needed. He does not use his Spiriva. He does not have any nocturnal symptoms. No limitations with activities of daily living. He has good functional status. No shortness of breath or wheezing or cough. He is only limited by his visceral obesity. Otherwise feels well. Up-to-date with flu shot and    has a past medical history of MS (multiple sclerosis) ([place]); Arthritis; Diastolic dysfunction; Right bundle branch block; Brugada syndrome; COPD (chronic obstructive pulmonary disease) ([place]); GERD (gastroesophageal reflux disease); Depression; Urinary retention with incomplete bladder emptying; BPH (benign prostatic hyperplasia); CAD (coronary artery disease); HLD (hyperlipidemia); HTN (hypertension); CHF (congestive heart failure) ([place]); Cardiac hypertrophy; and Bladder cancer ([place]).   reports that he quit smoking about 22 years ago. His smoking use included Cigarettes. He has a 80 pack-year smoking history. He has quit using smokeless tobacco. His smokeless tobacco use included Chew.  Past Surgical History  Procedure Laterality Date  . Total knee arthroplasty Right   . Transurethral resection of prostate    . Tonsillectomy    . Appendectomy    . Rotator cuff repair    . Hemorrhoid surgery    . Left heart catheterization with coronary angiogram N/A 01/05/2013    Procedure: LEFT HEART CATHETERIZATION  WITH CORONARY ANGIOGRAM;  Surgeon: Larey Dresser, MD;  Location: [facility] CATH LAB;  Service: Cardiovascular;  Laterality: N/A;    Allergies  Allergen Reactions  . Acetylcholine     unknown  . Alcohol-Sulfur [Sulfur]     unknown  . Amitriptyline     unknown  . Bupivacaine     unknown  . Clomipramine Hcl     unknown  . Cocaine     unknown  . Desipramine     unknown  . Ergonovine     unknown  . Flecainide     unknown  . Lithium     unknown  . Loxapine     unknown  . Nortriptyline     unknown  . Oxcarbazepine     unknown  . Procainamide     unknown  . Procaine     unknwon  . Propafenone     unknown  . Propofol     unknown  . Trifluoperazine     unknown    Immunization History  Administered Date(s) Administered  . Influenza Whole 11/07/2006, 09/30/2008  . Influenza, High Dose Seasonal PF 10/17/2012  . Influenza,inj,Quad PF,36+ Mos 09/04/2013, 11/18/2014  . Pneumococcal Conjugate-13 08/06/2014  . Pneumococcal Polysaccharide-23 11/07/2006  . Td 11/07/2006  . Tdap 07/04/2013  . Zoster 11/20/2009    Family History  Problem Relation Age of Onset  . Stroke Mother   . Stroke Father   . Hypertension Sister   . Kidney cancer Brother   . Arthritis Brother   . Other Brother     knee problems, Bil  TKR  . Hypertension      family history     Current outpatient prescriptions:  .  amLODipine (NORVASC) 5 MG tablet, TAKE 1 TABLET DAILY (PLEASE CALL AND SCHEDULE A ONE YEAR FOLLOW UP APPOINTMENT), Disp: 60 tablet, Rfl: 0 .  ARIPiprazole (ABILIFY) 5 MG tablet, TAKE 1 TABLET BY MOUTH EVERY DAY, Disp: 30 tablet, Rfl: 5 .  aspirin EC 81 MG tablet, Take 81 mg by mouth every other day. , Disp: , Rfl:  .  atorvastatin (LIPITOR) 20 MG tablet, TAKE 1 TABLET DAILY, Disp: 90 tablet, Rfl: 0 .  bisoprolol (ZEBETA) 5 MG tablet, TAKE ONE-HALF (1/2) TABLET DAILY, Disp: 45 tablet, Rfl: 1 .  cyclobenzaprine (FLEXERIL) 10 MG tablet, TAKE 1 TABLET BY MOUTH 3 TIMES A DAY AS NEEDED FOR  MUSCLE SPASMS, Disp: 30 tablet, Rfl: 2 .  furosemide (LASIX) 40 MG tablet, TAKE 1 TABLET TWICE A DAY, Disp: 60 tablet, Rfl: 0 .  HYDROcodone-acetaminophen (NORCO) 10-325 MG tablet, Take 1 tablet by mouth every 6 (six) hours as needed., Disp: 100 tablet, Rfl: 0 .  NAPROXEN PO, Take 600 mg by mouth daily., Disp: , Rfl:  .  oxybutynin (DITROPAN) 5 MG tablet, TAKE 1 TABLET BY MOUTH EVERY 8 HOURS AS NEEDED FOR BLADDER SPASMS, Disp: , Rfl: 3 .  predniSONE (DELTASONE) 10 MG tablet, Take 1 tablet (10 mg total) by mouth daily with breakfast. (Patient taking differently: Take 10 mg by mouth daily as needed. ), Disp: 30 tablet, Rfl: 0 .  PROAIR HFA 108 (90 BASE) MCG/ACT inhaler, USE 1 TO 2 INHALATIONS EVERY 6 HOURS AS NEEDED FOR WHEEZING OR SHORTNESS OF BREATH, Disp: 25.5 g, Rfl: 4 .  rOPINIRole (REQUIP) 1 MG tablet, TAKE 1 TABLET AT BEDTIME, Disp: 90 tablet, Rfl: 2 .  SPIRIVA HANDIHALER 18 MCG inhalation capsule, INHALE THE CONTENTS OF 1 CAPSULE WITH 2         INHALATIONS ONCE DAILY AS DIRECTED, Disp: 1 capsule, Rfl: 11 .  venlafaxine XR (EFFEXOR-XR) 150 MG 24 hr capsule, TAKE 1 CAPSULE TWICE A DAY, Disp: 180 capsule, Rfl: 3     Review of Systems     Objective:   Physical Exam  Constitutional: He is oriented to person, place, and time. He appears well-developed and well-nourished. No distress.  HENT:  Head: Normocephalic and atraumatic.  Right Ear: External ear normal.  Left Ear: External ear normal.  Mouth/Throat: Oropharynx is clear and moist. No oropharyngeal exudate.  Eyes: Conjunctivae and EOM are normal. Pupils are equal, round, and reactive to light. Right eye exhibits no discharge. Left eye exhibits no discharge. No scleral icterus.  Neck: Normal range of motion. Neck supple. No JVD present. No tracheal deviation present. No thyromegaly present.  Cardiovascular: Normal rate, regular rhythm and intact distal pulses.  Exam reveals no gallop and no friction rub.   No murmur  heard. Pulmonary/Chest: Effort normal and breath sounds normal. No respiratory distress. He has no wheezes. He has no rales. He exhibits no tenderness.  Abdominal: Soft. Bowel sounds are normal. He exhibits no distension and no mass. There is no tenderness. There is no rebound and no guarding.  Musculoskeletal: Normal range of motion. He exhibits no edema or tenderness.  Lymphadenopathy:    He has no cervical adenopathy.  Neurological: He is alert and oriented to person, place, and time. He has normal reflexes. No cranial nerve deficit. Coordination normal.  Skin: Skin is warm and dry. No rash noted. He is not diaphoretic. No erythema.  No pallor.  Psychiatric: He has a normal mood and affect. His behavior is normal. Judgment and thought content normal.  Nursing note and vitals reviewed.   Filed Vitals:   04/03/15 0908  BP: 148/96  Pulse: 80  Height: 5\' 11"  (1.803 m)  Weight: 220 lb 6.4 oz (99.973 kg)  SpO2: 98%        Assessment:       ICD-9-CM ICD-10-CM   1. Lung nodule 793.11 R91.1 Alpha-1 antitrypsin phenotype  2. Pulmonary emphysema, unspecified emphysema type ([place]) 492.8 J43.9 Alpha-1 antitrypsin phenotype       Plan:      Lung nodule  Do ct chest wo contrast June/july 2017   COPD - stable  - continue albuterol as needed  - check alpha 1 genetic test for copd   Followup  -= June/July 2017 after CT  - CAT score at followup  - discuss low dose lung cancer screen at followup if lung nodule stable  - possible dc from [facility] at next visit      Dr. Brand Males, M.D., [facility].C.P Pulmonary and Critical Care Medicine Staff Physician [place] [facility] Pager: [phone], If no answer or between  15:00h - 7:00h: call [phone]  04/03/2015 9:28 AM

## 2015-04-09 DIAGNOSIS — Z85828 Personal history of other malignant neoplasm of skin: Secondary | ICD-10-CM | POA: Diagnosis not present

## 2015-04-09 DIAGNOSIS — J3489 Other specified disorders of nose and nasal sinuses: Secondary | ICD-10-CM | POA: Diagnosis not present

## 2015-05-05 ENCOUNTER — Other Ambulatory Visit: Payer: Self-pay | Admitting: Cardiology

## 2015-05-07 ENCOUNTER — Telehealth: Payer: Self-pay | Admitting: Family Medicine

## 2015-05-07 NOTE — Telephone Encounter (Signed)
Pt is requesting a refill of Hydrocodone [phone]

## 2015-05-07 NOTE — Telephone Encounter (Signed)
Ok to refill 

## 2015-05-08 MED ORDER — HYDROCODONE-ACETAMINOPHEN 10-325 MG PO TABS: 1.0000 | ORAL_TABLET | Freq: Four times a day (QID) | ORAL | Status: DC | PRN

## 2015-05-08 NOTE — Telephone Encounter (Signed)
ok 

## 2015-05-08 NOTE — Telephone Encounter (Signed)
RX printed, left up front and patient aware to pick up after 2 pm via vm 

## 2015-05-12 ENCOUNTER — Ambulatory Visit: Payer: TRICARE For Life (TFL) | Admitting: Physician Assistant

## 2015-05-16 ENCOUNTER — Other Ambulatory Visit: Payer: Self-pay | Admitting: Cardiology

## 2015-05-16 ENCOUNTER — Other Ambulatory Visit: Payer: Self-pay

## 2015-05-16 MED ORDER — AMLODIPINE BESYLATE 5 MG PO TABS: ORAL_TABLET | ORAL | Status: DC

## 2015-05-20 ENCOUNTER — Telehealth: Payer: Self-pay | Admitting: Family Medicine

## 2015-05-20 NOTE — Telephone Encounter (Signed)
Patient is calling to to [facility]  [phone]

## 2015-05-21 NOTE — Telephone Encounter (Signed)
Patient is not on omeprazole.   Please clarify.

## 2015-06-03 ENCOUNTER — Encounter: Payer: Self-pay | Admitting: Cardiology

## 2015-06-05 ENCOUNTER — Ambulatory Visit (INDEPENDENT_AMBULATORY_CARE_PROVIDER_SITE_OTHER): Payer: Medicare Other | Admitting: Physician Assistant

## 2015-06-05 ENCOUNTER — Ambulatory Visit (INDEPENDENT_AMBULATORY_CARE_PROVIDER_SITE_OTHER)
Admission: RE | Admit: 2015-06-05 | Discharge: 2015-06-05 | Disposition: A | Discharge status: Home / Self Care | Payer: Medicare Other | Source: Ambulatory Visit | Attending: Internal Medicine | Admitting: Internal Medicine

## 2015-06-05 ENCOUNTER — Encounter: Payer: Self-pay | Admitting: Physician Assistant

## 2015-06-05 VITALS — BP 138/74 | HR 78 | Ht 72.0 in | Wt 217.8 lb

## 2015-06-05 DIAGNOSIS — E785 Hyperlipidemia, unspecified: Secondary | ICD-10-CM | POA: Diagnosis not present

## 2015-06-05 DIAGNOSIS — I10 Essential (primary) hypertension: Secondary | ICD-10-CM | POA: Diagnosis not present

## 2015-06-05 DIAGNOSIS — I2510 Atherosclerotic heart disease of native coronary artery without angina pectoris: Secondary | ICD-10-CM | POA: Diagnosis not present

## 2015-06-05 DIAGNOSIS — R911 Solitary pulmonary nodule: Secondary | ICD-10-CM | POA: Diagnosis not present

## 2015-06-05 DIAGNOSIS — I5032 Chronic diastolic (congestive) heart failure: Secondary | ICD-10-CM | POA: Diagnosis not present

## 2015-06-05 DIAGNOSIS — R918 Other nonspecific abnormal finding of lung field: Secondary | ICD-10-CM | POA: Diagnosis not present

## 2015-06-05 DIAGNOSIS — I340 Nonrheumatic mitral (valve) insufficiency: Secondary | ICD-10-CM

## 2015-06-05 DIAGNOSIS — K219 Gastro-esophageal reflux disease without esophagitis: Secondary | ICD-10-CM

## 2015-06-05 MED ORDER — OMEPRAZOLE 20 MG PO CPDR: 20.0000 mg | DELAYED_RELEASE_CAPSULE | Freq: Every day | ORAL | Status: DC

## 2015-06-05 NOTE — Progress Notes (Signed)
Cardiology Office Note:    Date:  06/05/2015   ID:  [name] [name], DOB 1940-10-12, MRN [id number]  PCP:  [name], MD  Cardiologist:  [name]   Electrophysiologist:  n/a  Referring MD: [name], MD   Chief Complaint  Patient presents with  . Congestive Heart Failure    follow up    History of Present Illness:     [name] [name] is a 75 y.o. male with a hx of CAD, HTN, HL, GERD, COPD. Evaluation for exertional dyspnea demonstrated diastolic dysfunction on echocardiogram and nonobstructive coronary artery disease by cardiac catheterization in 1/15. He has been seen by pulmonary in the past (Dr. Chase Caller) and PFTs in 9/15 demonstrated minimal obstruction. He may have a component of asthma. He has struggled with bipolar depression. Last seen by Dr. Aundra Dubin 1/16.  Here for follow-up. Here alone. Since last seen, he has been doing well. He just returned from a trip to [place]. He denies chest discomfort. Dyspnea with exertion is unchanged. He denies orthopnea, PND or significant pedal edema. Denies syncope.  Past Medical History  Diagnosis Date  . MS (multiple sclerosis) ([place])   . Arthritis     s/p TKR  . Right bundle branch block   . Brugada syndrome     Possible Type II Brugada ECG pattern. No family history of SCD, no syncope, no tachypalpitations.  Marland Kitchen COPD (chronic obstructive pulmonary disease) ([place])     Prior heavy smoker. PFTs (3/11): FVC 87%, FEV1 73%, ratio 0.57, DLCO 75%, TLC 121%. Moderate obstructive defect. PFTs (9/15): Only minimal obstruction, sugPrior heavy smoker. PFTs (3/11): FVC 87%, FEV1 73%, ratio 0.57, DLCO 75%, TLC 121%. Moderate obstructive defect. PFTs (9/15) minimal obstruction, poss asthma component   . GERD (gastroesophageal reflux disease)   . Depression     with bipolar tendencies  . Urinary retention with incomplete bladder emptying     receives botox injections and treatment for BPH through [facility]  . BPH (benign prostatic  hyperplasia)   . CAD (coronary artery disease)     a. LHC 1/15 - mid LCx 50 and 60%, proximal RCA 50%  . HLD (hyperlipidemia)   . HTN (hypertension)   . Chronic diastolic CHF (congestive heart failure) ([place])   . LVH (left ventricular hypertrophy)     a. Echo 12/14 Inferior and distal septal HK, mild LVH, EF 50-55%, mild MR, mild LAE  . Bladder cancer (H[facility])     [facility], Ta low grade papillary urothileal carcinoma  . DOE (dyspnea on exertion)     a. Myoview 8/09- EF 57%, no ischemia // b. Myoview (3/11) EF 68%, diaphragmatic attenuation, no ischemia //  c Echo (4/11) EF 0000000, mild diastolic dysfunction, PASP 38 mmHg // d. PFTs 9/15 minimal obstruction  /  e. CT negative for ILD  . Mitral regurgitation     Echo (4/11) with PISA ERO 0.3 cm^2 and regurgitant volume 41 mL (moderate MR). Echo (12/14) with mild MR.   Marland Kitchen History of Doppler ultrasound     Carotid US 3/11 negative for significant stenosis  . Mild dementia   . Low testosterone   . CKD (chronic kidney disease)   . Lung nodule     a. CT in 2015 >> PET in 12/15 not sugg of malignancy     Past Surgical History  Procedure Laterality Date  . Total knee arthroplasty Right   . Transurethral resection of prostate    . Tonsillectomy    .  Appendectomy    . Rotator cuff repair    . Hemorrhoid surgery    . Left heart catheterization with coronary angiogram N/A 01/05/2013    Procedure: LEFT HEART CATHETERIZATION WITH CORONARY ANGIOGRAM;  Surgeon: Larey Dresser, MD;  Location: [facility] CATH LAB;  Service: Cardiovascular;  Laterality: N/A;    Current Medications: Outpatient Prescriptions Prior to Visit  Medication Sig Dispense Refill  . ARIPiprazole (ABILIFY) 5 MG tablet TAKE 1 TABLET BY MOUTH EVERY DAY 30 tablet 5  . aspirin EC 81 MG tablet Take 81 mg by mouth every other day.     . furosemide (LASIX) 40 MG tablet Take 1 tablet (40 mg total) by mouth 2 (two) times daily. Please keep upcoming appointment for further refills 180 tablet 0  .  NAPROXEN PO Take 600 mg by mouth daily.    Marland Kitchen oxybutynin (DITROPAN) 5 MG tablet TAKE 1 TABLET BY MOUTH EVERY 8 HOURS AS NEEDED FOR BLADDER SPASMS  3  . PROAIR HFA 108 (90 BASE) MCG/ACT inhaler USE 1 TO 2 INHALATIONS EVERY 6 HOURS AS NEEDED FOR WHEEZING OR SHORTNESS OF BREATH 25.5 g 4  . SPIRIVA HANDIHALER 18 MCG inhalation capsule INHALE THE CONTENTS OF 1 CAPSULE WITH 2         INHALATIONS ONCE DAILY AS DIRECTED 1 capsule 11  . amLODipine (NORVASC) 5 MG tablet TAKE 1 TABLET DAILY (PLEASE CALL AND SCHEDULE A ONE YEAR FOLLOW UP APPOINTMENT) (Patient not taking: Reported on 06/05/2015) 60 tablet 1  . atorvastatin (LIPITOR) 20 MG tablet TAKE 1 TABLET DAILY (Patient not taking: Reported on 06/05/2015) 90 tablet 0  . bisoprolol (ZEBETA) 5 MG tablet TAKE ONE-HALF (1/2) TABLET DAILY (Patient not taking: Reported on 06/05/2015) 45 tablet 1  . cyclobenzaprine (FLEXERIL) 10 MG tablet TAKE 1 TABLET BY MOUTH 3 TIMES A DAY AS NEEDED FOR MUSCLE SPASMS (Patient not taking: Reported on 06/05/2015) 30 tablet 2  . HYDROcodone-acetaminophen (NORCO) 10-325 MG tablet Take 1 tablet by mouth every 6 (six) hours as needed. (Patient not taking: Reported on 06/05/2015) 100 tablet 0  . predniSONE (DELTASONE) 10 MG tablet Take 1 tablet (10 mg total) by mouth daily with breakfast. (Patient not taking: Reported on 06/05/2015) 30 tablet 0  . rOPINIRole (REQUIP) 1 MG tablet TAKE 1 TABLET AT BEDTIME (Patient not taking: Reported on 06/05/2015) 90 tablet 2  . venlafaxine XR (EFFEXOR-XR) 150 MG 24 hr capsule TAKE 1 CAPSULE TWICE A DAY (Patient not taking: Reported on 06/05/2015) 180 capsule 3   No facility-administered medications prior to visit.      Allergies:   Acetylcholine; Alcohol-sulfur; Amitriptyline; Bupivacaine; Clomipramine hcl; Cocaine; Desipramine; Ergonovine; Flecainide; Lithium; Loxapine; Nortriptyline; Oxcarbazepine; Procainamide; Procaine; Propafenone; Propofol; and Trifluoperazine   Social History   Social History  . Marital  Status: Married    Spouse Name: N/A  . Number of Children: N/A  . Years of Education: N/A   Occupational History  . retired    Social History Main Topics  . Smoking status: Former Smoker -- 2.00 packs/day for 40 years    Types: Cigarettes    Quit date: 01/04/1993  . Smokeless tobacco: Former Systems developer    Types: Chew  . Alcohol Use: Yes     Comment: 6 pack/day  . Drug Use: None  . Sexual Activity: Not Asked   Other Topics Concern  . None   Social History Narrative   Retired from the [employer] after 20 years   [place] Veteran     Family History:  The patient's family history includes Arthritis in his brother; Hypertension in his sister; Kidney cancer in his brother; Other in his brother; Stroke in his father and mother.   ROS:   Please see the history of present illness.    Review of Systems  Cardiovascular: Positive for dyspnea on exertion.   All other systems reviewed and are negative.   Physical Exam:    VS:  BP 138/74 mmHg  Pulse 78  Ht 6' (1.829 m)  Wt 217 lb 12.8 oz (98.793 kg)  BMI 29.53 kg/m2   Physical Exam  Constitutional: He is oriented to person, place, and time. He appears well-developed and well-nourished.  HENT:  Head: Normocephalic and atraumatic.  Neck: Normal range of motion. No JVD present.  Cardiovascular: Normal rate, regular rhythm and normal heart sounds.  Exam reveals no friction rub.   No murmur heard. Trace bilateral LE edema  Pulmonary/Chest: Effort normal and breath sounds normal. He has no wheezes. He has no rales.  Abdominal: Soft. He exhibits no distension. There is no tenderness.  Musculoskeletal: Normal range of motion.  Neurological: He is alert and oriented to person, place, and time.  Skin: Skin is warm and dry.  Psychiatric: He has a normal mood and affect.     Wt Readings from Last 3 Encounters:  06/05/15 217 lb 12.8 oz (98.793 kg)  04/03/15 220 lb 6.4 oz (99.973 kg)  03/28/15 221 lb (100.245 kg)      Studies/Labs  Reviewed:     EKG:  EKG is  ordered today.  The ekg ordered today demonstrates NSR, HR 78, LAD, RSR prime in V1 with ST elevation and T-wave inversion (Brugada pattern), no change from prior tracing  Recent Labs: 01/27/2015: Hemoglobin 14.1; Platelets 446* 03/31/2015: ALT 18; BUN 19; Creat 1.12; Potassium 4.8; Sodium 138   Recent Lipid Panel    Component Value Date/Time   CHOL 158 03/31/2015 0908   TRIG 170* 03/31/2015 0908   HDL 48 03/31/2015 0908   CHOLHDL 3.3 03/31/2015 0908   VLDL 34* 03/31/2015 0908   LDLCALC 76 03/31/2015 0908   LDLDIRECT 87.0 02/01/2014 1019    Additional studies/ records that were reviewed today include:   LHC 1/15 LM No significant disease.  LAD: Luminal irregularities.  LCx: serial 50% and 60% stenoses in the mid LCx.  RCA: 50% proximal RCA stenosis.  LVEF is estimated at 55% without significant wall motion abnormalities noted in RAO projection.  Final Conclusions: There is moderate coronary disease involving the mid LCx and the proximal RCA. There are no severe stenoses. I do not think that coronary disease is causing the patient's exertional dyspnea. His LVEDP also is not elevated on the higher Lasix dose. I suspect that at this point COPD is the major cause of his dyspnea.   Echo 12/14 Inferior and distal septal HK, mild LVH, EF 50-55%, mild MR, mild LAE   ASSESSMENT:     1. Chronic diastolic CHF (congestive heart failure) ([place])   2. Coronary artery disease involving native coronary artery of native heart without angina pectoris   3. Essential hypertension   4. Hyperlipidemia   5. Mitral regurgitation   6. Gastroesophageal reflux disease without esophagitis     PLAN:     In order of problems listed above:  1. Chronic diastolic HF - EF by echocardiogram in 2014 was 50-55%. He has been maintained on low-dose Lasix. Volume status appears to be stable. He is NYHA 2-2b. Continue current dose of Lasix. Creatinine  stable by labs done in  March 2017.  2. CAD - Nonobstructive disease by cardiac catheterization in 2015. He denies chest discomfort.Continue aspirin, statin.  3. HTN -  Blood pressure with marginal control. Continue current therapy. Continue to monitor.  4. HL - Managed by primary care. Continue statin.  5. Mitral regurgitation - Mild by echocardiogram in 2014. Exam without systolic murmur today.  6. GERD - He notes being on Prilosec for years and requests a refill. This will be sent in to his pharmacy.   Medication Adjustments/Labs and Tests Ordered: Current medicines are reviewed at length with the patient today.  Concerns regarding medicines are outlined above.  Medication changes, Labs and Tests ordered today are outlined in the Patient Instructions noted below. Patient Instructions  Medication Instructions:  1. START PRILOSEC 20 MG DAILY; [facility] Labwork: NONE Testing/Procedures: NONE Follow-Up: Your physician wants you to follow-up in: [place] Aundra Dubin. You will receive a reminder letter in the mail two months in advance. If you don't receive a letter, please call our office to schedule the follow-up appointment. Any Other Special Instructions Will Be Listed Below (If Applicable). If you need a refill on your cardiac medications before your next appointment, please call your pharmacy.   Signed, Richardson Dopp, PA-C  06/05/2015 2:44 PM    [facility] [address], [place], [place]  [address] Phone: [phone]; Fax: [phone]

## 2015-06-05 NOTE — Patient Instructions (Addendum)
Medication Instructions:  1. START PRILOSEC 20 MG DAILY; [facility] Labwork: NONE Testing/Procedures: NONE Follow-Up: Your physician wants you to follow-up in: [place] Aundra Dubin. You will receive a reminder letter in the mail two months in advance. If you don't receive a letter, please call our office to schedule the follow-up appointment. Any Other Special Instructions Will Be Listed Below (If Applicable). If you need a refill on your cardiac medications before your next appointment, please call your pharmacy.

## 2015-06-06 ENCOUNTER — Encounter: Payer: Self-pay | Admitting: Family Medicine

## 2015-06-06 ENCOUNTER — Telehealth: Payer: Self-pay | Admitting: Internal Medicine

## 2015-06-06 DIAGNOSIS — I712 Thoracic aortic aneurysm, without rupture, unspecified: Secondary | ICD-10-CM | POA: Insufficient documentation

## 2015-06-06 NOTE — Telephone Encounter (Signed)
All lung nodules stable since aug 2016. Good news  Ct Chest Wo Contrast  06/05/2015  CLINICAL DATA:  Lung nodule followup EXAM: CT CHEST WITHOUT CONTRAST TECHNIQUE: Multidetector CT imaging of the chest was performed following the standard protocol without IV contrast. COMPARISON:  08/07/2014 FINDINGS: Mediastinum/Nodes: Coronary, aortic arch, and branch vessel atherosclerotic vascular disease. 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 Thick atheromatous calcification along the posterior margin of the descending thoracic aorta near the hiatus, extending into the lumen, as on prior exam, compatible with chronic calcified mural thrombus. No pathologic thoracic adenopathy. Lungs/Pleura: 0.6 by 0.5 cm right middle lobe pulmonary nodule, no change 06/26/2013. There are 3 nodular spiculated densities in the right middle lobe inferomedially. One of these measures 8 mm in thickness on image 109 series 3, previously the same on 06/06/2013. Another measures 2.4 by 1.1 cm on image 108 series 3, previously 2.0 by 1.6 cm in 2015. The most anterior, which extends adjacent to the diaphragm, measures 2.9 by 1.8 cm, formerly 2.8 by 1.6 cm by my measurements on 06/26/2013. Accordingly these 3 spiculated nodules are essentially stable as well. These nodules have no hypermetabolic activity on PET-CT of 12/11/2013. There is some scarring in the lingula which appears stable. No new nodule. Upper abdomen: Unremarkable Musculoskeletal: Thoracic spondylosis. IMPRESSION: 1. The nodules are stable from 06/26/2013. More spiculated nodules inferomedially in the right middle lobe were not previously positive on PET scan and accordingly are likely benign. The 5 by 6 mm nodule is also stable over the past 2 years. This 2 year stability is compatible with benign process, and based on current guidelines, further follow up surveillance is not required. 2. 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 Recommend annual imaging followup by  CTA or MRA. This recommendation follows 2010 ACCF/AHA/AATS/ACR/ASA/SCA/SCAI/SIR/STS/SVM Guidelines for the Diagnosis and Management of Patients with Thoracic Aortic Disease. Circulation. 2010; 121: [id number] 3. Coronary, aortic arch, and branch vessel atherosclerotic vascular disease. Electronically Signed   By: Van Clines M.D.   On: 06/05/2015 17:00

## 2015-06-09 ENCOUNTER — Telehealth: Payer: Self-pay | Admitting: Family Medicine

## 2015-06-09 MED ORDER — HYDROCODONE-ACETAMINOPHEN 10-325 MG PO TABS: 1.0000 | ORAL_TABLET | Freq: Four times a day (QID) | ORAL | Status: DC | PRN

## 2015-06-09 NOTE — Telephone Encounter (Signed)
Patient needs rx for hydrocodone  [phone]

## 2015-06-09 NOTE — Telephone Encounter (Signed)
Hydrocodone refill request.  Last seen 03/28/2015, last filled 05/08/2015.  Please advise.

## 2015-06-09 NOTE — Telephone Encounter (Signed)
Okay to refill? 

## 2015-06-09 NOTE — Telephone Encounter (Signed)
Hydrocodone rx printed for patient to pick up.  Left message for patient.

## 2015-06-10 NOTE — Telephone Encounter (Signed)
Spoke with pt, aware of results/recs.  Nothing further needed.  

## 2015-06-15 ENCOUNTER — Emergency Department (HOSPITAL_COMMUNITY): Payer: Medicare Other

## 2015-06-15 ENCOUNTER — Encounter (HOSPITAL_COMMUNITY): Payer: Self-pay

## 2015-06-15 ENCOUNTER — Inpatient Hospital Stay (HOSPITAL_COMMUNITY): Payer: Medicare Other

## 2015-06-15 ENCOUNTER — Inpatient Hospital Stay (HOSPITAL_COMMUNITY)
Admission: EM | Admit: 2015-06-15 | Discharge: 2015-06-18 | DRG: 065 | Disposition: A | Discharge status: Home / Self Care | Payer: Medicare Other | Attending: Internal Medicine | Admitting: Internal Medicine

## 2015-06-15 DIAGNOSIS — Z79899 Other long term (current) drug therapy: Secondary | ICD-10-CM

## 2015-06-15 DIAGNOSIS — R270 Ataxia, unspecified: Secondary | ICD-10-CM | POA: Diagnosis present

## 2015-06-15 DIAGNOSIS — I639 Cerebral infarction, unspecified: Secondary | ICD-10-CM | POA: Diagnosis present

## 2015-06-15 DIAGNOSIS — G35 Multiple sclerosis: Secondary | ICD-10-CM | POA: Diagnosis not present

## 2015-06-15 DIAGNOSIS — R410 Disorientation, unspecified: Secondary | ICD-10-CM | POA: Diagnosis present

## 2015-06-15 DIAGNOSIS — I63511 Cerebral infarction due to unspecified occlusion or stenosis of right middle cerebral artery: Secondary | ICD-10-CM | POA: Diagnosis not present

## 2015-06-15 DIAGNOSIS — N189 Chronic kidney disease, unspecified: Secondary | ICD-10-CM | POA: Diagnosis present

## 2015-06-15 DIAGNOSIS — J449 Chronic obstructive pulmonary disease, unspecified: Secondary | ICD-10-CM | POA: Diagnosis present

## 2015-06-15 DIAGNOSIS — I340 Nonrheumatic mitral (valve) insufficiency: Secondary | ICD-10-CM | POA: Diagnosis present

## 2015-06-15 DIAGNOSIS — I2510 Atherosclerotic heart disease of native coronary artery without angina pectoris: Secondary | ICD-10-CM | POA: Diagnosis present

## 2015-06-15 DIAGNOSIS — N401 Enlarged prostate with lower urinary tract symptoms: Secondary | ICD-10-CM | POA: Diagnosis present

## 2015-06-15 DIAGNOSIS — K219 Gastro-esophageal reflux disease without esophagitis: Secondary | ICD-10-CM | POA: Diagnosis present

## 2015-06-15 DIAGNOSIS — I10 Essential (primary) hypertension: Secondary | ICD-10-CM | POA: Diagnosis not present

## 2015-06-15 DIAGNOSIS — F329 Major depressive disorder, single episode, unspecified: Secondary | ICD-10-CM | POA: Diagnosis present

## 2015-06-15 DIAGNOSIS — Z8551 Personal history of malignant neoplasm of bladder: Secondary | ICD-10-CM

## 2015-06-15 DIAGNOSIS — N400 Enlarged prostate without lower urinary tract symptoms: Secondary | ICD-10-CM | POA: Diagnosis not present

## 2015-06-15 DIAGNOSIS — Z7982 Long term (current) use of aspirin: Secondary | ICD-10-CM | POA: Diagnosis not present

## 2015-06-15 DIAGNOSIS — Z96651 Presence of right artificial knee joint: Secondary | ICD-10-CM | POA: Diagnosis present

## 2015-06-15 DIAGNOSIS — Z87891 Personal history of nicotine dependence: Secondary | ICD-10-CM

## 2015-06-15 DIAGNOSIS — R4781 Slurred speech: Secondary | ICD-10-CM | POA: Diagnosis not present

## 2015-06-15 DIAGNOSIS — I63411 Cerebral infarction due to embolism of right middle cerebral artery: Secondary | ICD-10-CM | POA: Diagnosis not present

## 2015-06-15 DIAGNOSIS — R29810 Facial weakness: Secondary | ICD-10-CM | POA: Diagnosis present

## 2015-06-15 DIAGNOSIS — E785 Hyperlipidemia, unspecified: Secondary | ICD-10-CM | POA: Diagnosis present

## 2015-06-15 DIAGNOSIS — I130 Hypertensive heart and chronic kidney disease with heart failure and stage 1 through stage 4 chronic kidney disease, or unspecified chronic kidney disease: Secondary | ICD-10-CM | POA: Diagnosis present

## 2015-06-15 DIAGNOSIS — I110 Hypertensive heart disease with heart failure: Secondary | ICD-10-CM | POA: Diagnosis present

## 2015-06-15 DIAGNOSIS — F32A Depression, unspecified: Secondary | ICD-10-CM | POA: Diagnosis present

## 2015-06-15 DIAGNOSIS — I4510 Unspecified right bundle-branch block: Secondary | ICD-10-CM | POA: Diagnosis present

## 2015-06-15 DIAGNOSIS — I5032 Chronic diastolic (congestive) heart failure: Secondary | ICD-10-CM | POA: Diagnosis present

## 2015-06-15 DIAGNOSIS — I712 Thoracic aortic aneurysm, without rupture: Secondary | ICD-10-CM | POA: Diagnosis present

## 2015-06-15 DIAGNOSIS — I6789 Other cerebrovascular disease: Secondary | ICD-10-CM | POA: Diagnosis not present

## 2015-06-15 DIAGNOSIS — F0390 Unspecified dementia without behavioral disturbance: Secondary | ICD-10-CM | POA: Diagnosis present

## 2015-06-15 LAB — COMPREHENSIVE METABOLIC PANEL
ALBUMIN: 4 g/dL (ref 3.5–5.0)
ALT: 23 U/L (ref 17–63)
ANION GAP: 10 (ref 5–15)
AST: 24 U/L (ref 15–41)
Alkaline Phosphatase: 72 U/L (ref 38–126)
BILIRUBIN TOTAL: 0.6 mg/dL (ref 0.3–1.2)
BUN: 25 mg/dL — ABNORMAL HIGH (ref 6–20)
CO2: 21 mmol/L — ABNORMAL LOW (ref 22–32)
Calcium: 9.1 mg/dL (ref 8.9–10.3)
Chloride: 106 mmol/L (ref 101–111)
Creatinine, Ser: 1.2 mg/dL (ref 0.61–1.24)
GFR calc non Af Amer: 58 mL/min — ABNORMAL LOW (ref >60)
GLUCOSE: 119 mg/dL — AB (ref 65–99)
POTASSIUM: 4.6 mmol/L (ref 3.5–5.1)
SODIUM: 137 mmol/L (ref 135–145)
TOTAL PROTEIN: 7.2 g/dL (ref 6.5–8.1)

## 2015-06-15 LAB — URINALYSIS, ROUTINE W REFLEX MICROSCOPIC
Bilirubin Urine: NEGATIVE
GLUCOSE, UA: 100 mg/dL — AB
Hgb urine dipstick: NEGATIVE
KETONES UR: NEGATIVE mg/dL
LEUKOCYTES UA: NEGATIVE
NITRITE: POSITIVE — AB
PROTEIN: NEGATIVE mg/dL
Specific Gravity, Urine: 1.024 (ref 1.005–1.030)
pH: 6 (ref 5.0–8.0)

## 2015-06-15 LAB — RAPID URINE DRUG SCREEN, HOSP PERFORMED
Amphetamines: NOT DETECTED
BARBITURATES: NOT DETECTED
BENZODIAZEPINES: NOT DETECTED
Cocaine: NOT DETECTED
Opiates: POSITIVE — AB
Tetrahydrocannabinol: NOT DETECTED

## 2015-06-15 LAB — CBC
HCT: 45.4 % (ref 39.0–52.0)
Hemoglobin: 14.3 g/dL (ref 13.0–17.0)
MCH: 29.9 pg (ref 26.0–34.0)
MCHC: 31.5 g/dL (ref 30.0–36.0)
MCV: 94.8 fL (ref 78.0–100.0)
PLATELETS: 373 10*3/uL (ref 150–400)
RBC: 4.79 MIL/uL (ref 4.22–5.81)
RDW: 14.7 % (ref 11.5–15.5)
WBC: 9.3 10*3/uL (ref 4.0–10.5)

## 2015-06-15 LAB — PROTIME-INR
INR: 0.95 (ref 0.00–1.49)
PROTHROMBIN TIME: 12.9 s (ref 11.6–15.2)

## 2015-06-15 LAB — AMMONIA: AMMONIA: 38 umol/L — AB (ref 9–35)

## 2015-06-15 LAB — URINE MICROSCOPIC-ADD ON: RBC / HPF: NONE SEEN RBC/hpf (ref 0–5)

## 2015-06-15 LAB — DIFFERENTIAL
Basophils Absolute: 0.1 10*3/uL (ref 0.0–0.1)
Basophils Relative: 1 %
EOS ABS: 0.7 10*3/uL (ref 0.0–0.7)
EOS PCT: 7 %
Lymphocytes Relative: 28 %
Lymphs Abs: 2.6 10*3/uL (ref 0.7–4.0)
MONO ABS: 0.8 10*3/uL (ref 0.1–1.0)
Monocytes Relative: 9 %
NEUTROS PCT: 55 %
Neutro Abs: 5.2 10*3/uL (ref 1.7–7.7)

## 2015-06-15 LAB — CBG MONITORING, ED: Glucose-Capillary: 123 mg/dL — ABNORMAL HIGH (ref 65–99)

## 2015-06-15 LAB — ETHANOL

## 2015-06-15 LAB — APTT: aPTT: 30 seconds (ref 24–37)

## 2015-06-15 LAB — I-STAT TROPONIN, ED: Troponin i, poc: 0.03 ng/mL (ref 0.00–0.08)

## 2015-06-15 MED ORDER — ASPIRIN 300 MG RE SUPP: 300.0000 mg | Freq: Every day | RECTAL | Status: DC

## 2015-06-15 MED ORDER — ROPINIROLE HCL 1 MG PO TABS
1.0000 mg | ORAL_TABLET | Freq: Every day | ORAL | Status: DC
  Administered 2015-06-16 – 2015-06-17 (×2): 1 mg via ORAL
  Filled 2015-06-15 (×2): qty 1

## 2015-06-15 MED ORDER — VITAMIN B-12 1000 MCG PO TABS
1000.0000 ug | ORAL_TABLET | Freq: Every day | ORAL | Status: DC
  Administered 2015-06-16 – 2015-06-18 (×3): 1000 ug via ORAL
  Filled 2015-06-15 (×3): qty 1

## 2015-06-15 MED ORDER — STROKE: EARLY STAGES OF RECOVERY BOOK
Freq: Once | Status: AC
Start: 2015-06-15 — End: 2015-06-15
  Administered 2015-06-15: 16:00:00

## 2015-06-15 MED ORDER — ARIPIPRAZOLE 10 MG PO TABS
5.0000 mg | ORAL_TABLET | Freq: Every day | ORAL | Status: DC
  Administered 2015-06-15 – 2015-06-18 (×4): 5 mg via ORAL
  Filled 2015-06-15 (×4): qty 1

## 2015-06-15 MED ORDER — FUROSEMIDE 40 MG PO TABS
40.0000 mg | ORAL_TABLET | Freq: Two times a day (BID) | ORAL | Status: DC
  Administered 2015-06-15 – 2015-06-17 (×4): 40 mg via ORAL
  Filled 2015-06-15 (×4): qty 1

## 2015-06-15 MED ORDER — PANTOPRAZOLE SODIUM 40 MG PO TBEC
40.0000 mg | DELAYED_RELEASE_TABLET | Freq: Every day | ORAL | Status: DC
  Administered 2015-06-15 – 2015-06-18 (×4): 40 mg via ORAL
  Filled 2015-06-15 (×4): qty 1

## 2015-06-15 MED ORDER — ASPIRIN EC 325 MG PO TBEC
325.0000 mg | DELAYED_RELEASE_TABLET | Freq: Once | ORAL | Status: AC
  Administered 2015-06-15: 325 mg via ORAL
  Filled 2015-06-15: qty 1

## 2015-06-15 MED ORDER — ASPIRIN 325 MG PO TABS
325.0000 mg | ORAL_TABLET | Freq: Every day | ORAL | Status: DC
  Administered 2015-06-15 – 2015-06-18 (×4): 325 mg via ORAL
  Filled 2015-06-15 (×4): qty 1

## 2015-06-15 MED ORDER — ALBUTEROL SULFATE (2.5 MG/3ML) 0.083% IN NEBU: 2.5000 mg | INHALATION_SOLUTION | RESPIRATORY_TRACT | Status: DC | PRN

## 2015-06-15 MED ORDER — VENLAFAXINE HCL ER 75 MG PO CP24
150.0000 mg | ORAL_CAPSULE | Freq: Two times a day (BID) | ORAL | Status: DC
  Administered 2015-06-16 – 2015-06-18 (×5): 150 mg via ORAL
  Filled 2015-06-15 (×5): qty 2

## 2015-06-15 MED ORDER — BISOPROLOL FUMARATE 5 MG PO TABS
2.5000 mg | ORAL_TABLET | Freq: Every day | ORAL | Status: DC
  Administered 2015-06-15 – 2015-06-18 (×4): 2.5 mg via ORAL
  Filled 2015-06-15 (×4): qty 0.5

## 2015-06-15 MED ORDER — ENOXAPARIN SODIUM 40 MG/0.4ML ~~LOC~~ SOLN
40.0000 mg | SUBCUTANEOUS | Status: DC
  Administered 2015-06-15 – 2015-06-17 (×3): 40 mg via SUBCUTANEOUS
  Filled 2015-06-15 (×3): qty 0.4

## 2015-06-15 MED ORDER — GADOBENATE DIMEGLUMINE 529 MG/ML IV SOLN
20.0000 mL | Freq: Once | INTRAVENOUS | Status: AC | PRN
  Administered 2015-06-15: 20 mL via INTRAVENOUS

## 2015-06-15 MED ORDER — TIOTROPIUM BROMIDE MONOHYDRATE 18 MCG IN CAPS
18.0000 ug | ORAL_CAPSULE | Freq: Every day | RESPIRATORY_TRACT | Status: DC
  Administered 2015-06-16 – 2015-06-18 (×3): 18 ug via RESPIRATORY_TRACT
  Filled 2015-06-15 (×2): qty 5

## 2015-06-15 MED ORDER — ALBUTEROL SULFATE HFA 108 (90 BASE) MCG/ACT IN AERS: 1.0000 | INHALATION_SPRAY | RESPIRATORY_TRACT | Status: DC | PRN

## 2015-06-15 MED ORDER — ATORVASTATIN CALCIUM 10 MG PO TABS
20.0000 mg | ORAL_TABLET | Freq: Every day | ORAL | Status: DC
  Administered 2015-06-15 – 2015-06-17 (×3): 20 mg via ORAL
  Filled 2015-06-15 (×3): qty 2

## 2015-06-15 MED ORDER — OXYBUTYNIN CHLORIDE ER 5 MG PO TB24
5.0000 mg | ORAL_TABLET | Freq: Every day | ORAL | Status: DC
  Administered 2015-06-16 – 2015-06-17 (×2): 5 mg via ORAL
  Filled 2015-06-15 (×3): qty 1

## 2015-06-15 NOTE — H&P (Signed)
History and Physical    [name] [name] [id number] DOB: 12/27/1940 DOA: 06/15/2015   PCP: [name], MD   Patient coming from/Resides with: Private residence/lives with wife  Chief Complaint: Slurred speech and altered mental status  HPI: [name] [name] is a 75 y.o. male with medical history significant for multiple sclerosis, chronic diastolic heart failure on Lasix, hypertension, CAD, COPD, dyslipidemia, BPH, depression and GERD. Patient presented to the hospital today after developing slurred speech and mild confusion as well as mild gait ataxia since this past Friday. Evaluation in the ER was consistent with right MCA stroke on MRI. Formal neurological evaluation pending. Patient presented greater than 8 hours after symptoms began secure is not a candidate for thrombolytic therapy.  ED Course:  BP 151/91-pulse 71-respirations 16-remains saturations 96% CT had without contrast: No acute abnormality, periventricular white matter hypoattenuation secondary to known history of multiple sclerosis, cerebral and cerebellar atrophy MRI brain with contrast: Areas of acute infarction in the region of the insula and posterior frontal lobe on the right consistent with right MCA branch vessel occlusion to no swelling or hemorrhage; extensive chronic small vessel ischemic changes progressive since previous exam consistent with chronic demyelination in patient with known multiple sclerosis Lab data: Sodium 137, potassium 4.6, BUN 25, creatinine 1.2, glucose 119, LFTs normal, ammonia level slightly elevated at 38, troponin point-of-care 0.03, WBCs 9300 with normal differential, hemoglobin 14.3, platelets 373,000   Review of Systems:  In addition to the HPI above,  No Fever-chills, myalgias or other constitutional symptoms No Headache, changes with Vision or hearing, new weakness, tingling, numbness in any extremity, only gait ataxia with slurred speech and mild confusion No problems  swallowing food or Liquids, indigestion/reflux No Chest pain, Cough or Shortness of Breath, palpitations, orthopnea or DOE No Abdominal pain, N/V; no melena or hematochezia, no dark tarry stools, Bowel movements are regular, No dysuria, hematuria or flank pain No new skin rashes, lesions, masses or bruises, No new joints pains-aches No recent weight gain or loss No polyuria, polydypsia or polyphagia,   Past Medical History  Diagnosis Date  . MS (multiple sclerosis) ([place])   . Arthritis     s/p TKR  . Right bundle branch block   . Brugada syndrome     Possible Type II Brugada ECG pattern. No family history of SCD, no syncope, no tachypalpitations.  Marland Kitchen COPD (chronic obstructive pulmonary disease) ([place])     Prior heavy smoker. PFTs (3/11): FVC 87%, FEV1 73%, ratio 0.57, DLCO 75%, TLC 121%. Moderate obstructive defect. PFTs (9/15): Only minimal obstruction, sugPrior heavy smoker. PFTs (3/11): FVC 87%, FEV1 73%, ratio 0.57, DLCO 75%, TLC 121%. Moderate obstructive defect. PFTs (9/15) minimal obstruction, poss asthma component   . GERD (gastroesophageal reflux disease)   . Depression     with bipolar tendencies  . Urinary retention with incomplete bladder emptying     receives botox injections and treatment for BPH through [facility]  . BPH (benign prostatic hyperplasia)   . CAD (coronary artery disease)     a. LHC 1/15 - mid LCx 50 and 60%, proximal RCA 50%  . HLD (hyperlipidemia)   . HTN (hypertension)   . Chronic diastolic CHF (congestive heart failure) ([place])   . LVH (left ventricular hypertrophy)     a. Echo 12/14 Inferior and distal septal HK, mild LVH, EF 50-55%, mild MR, mild LAE  . Bladder cancer (HCC)     [facility], Ta low grade papillary urothileal carcinoma  . DOE (dyspnea on  exertion)     a. Myoview 8/09- EF 57%, no ischemia // b. Myoview (3/11) EF 68%, diaphragmatic attenuation, no ischemia //  c Echo (4/11) EF 0000000, mild diastolic dysfunction, PASP 38 mmHg // d. PFTs 9/15 minimal  obstruction  /  e. CT negative for ILD  . Mitral regurgitation     Echo (4/11) with PISA ERO 0.3 cm^2 and regurgitant volume 41 mL (moderate MR). Echo (12/14) with mild MR.   Marland Kitchen History of Doppler ultrasound     Carotid US 3/11 negative for significant stenosis  . Mild dementia   . Low testosterone   . CKD (chronic kidney disease)   . Lung nodule     a. CT in 2015 >> PET in 12/15 not sugg of malignancy   . Thoracic aortic aneurysm (HCC)     4.1 cm 2017    Past Surgical History  Procedure Laterality Date  . Total knee arthroplasty Right   . Transurethral resection of prostate    . Tonsillectomy    . Appendectomy    . Rotator cuff repair    . Hemorrhoid surgery    . Left heart catheterization with coronary angiogram N/A 01/05/2013    Procedure: LEFT HEART CATHETERIZATION WITH CORONARY ANGIOGRAM;  Surgeon: Larey Dresser, MD;  Location: [facility] CATH LAB;  Service: Cardiovascular;  Laterality: N/A;    Social History   Social History  . Marital Status: Married    Spouse Name: N/A  . Number of Children: N/A  . Years of Education: N/A   Occupational History  . retired    Social History Main Topics  . Smoking status: Former Smoker -- 2.00 packs/day for 40 years    Types: Cigarettes    Quit date: 01/04/1993  . Smokeless tobacco: Former Systems developer    Types: Chew  . Alcohol Use: Yes     Comment: 6 pack/day  . Drug Use: Not on file  . Sexual Activity: Not on file   Other Topics Concern  . Not on file   Social History Narrative   Retired from the [employer] after 20 years   [place] Veteran    Mobility: Without assistive devices Work history: Not obtained   Allergies  Allergen Reactions  . Acetylcholine     unknown  . Alcohol-Sulfur [Sulfur]     unknown  . Amitriptyline     unknown  . Bupivacaine     unknown  . Clomipramine Hcl     unknown  . Cocaine     unknown  . Desipramine     unknown  . Ergonovine     unknown  . Flecainide     unknown  . Lithium      unknown  . Loxapine     unknown  . Nortriptyline     unknown  . Oxcarbazepine     unknown  . Procainamide     unknown  . Procaine     unknwon  . Propafenone     unknown  . Propofol     unknown  . Trifluoperazine     unknown    Family History  Problem Relation Age of Onset  . Stroke Mother   . Stroke Father   . Hypertension Sister   . Kidney cancer Brother   . Arthritis Brother   . Other Brother     knee problems, Bil TKR  . Hypertension      family history     Prior to Admission medications   Medication  Sig Start Date End Date Taking? Authorizing Provider  amLODipine (NORVASC) 5 MG tablet Take 5 mg by mouth daily.   Yes Historical Provider, MD  ARIPiprazole (ABILIFY) 5 MG tablet TAKE 1 TABLET BY MOUTH EVERY DAY 09/30/14  Yes [name], MD  aspirin EC 81 MG tablet Take 81 mg by mouth every other day.    Yes Historical Provider, MD  atorvastatin (LIPITOR) 20 MG tablet Take 20 mg by mouth daily.   Yes Historical Provider, MD  bisoprolol (ZEBETA) 5 MG tablet TAKE ONE-HALF (1/2) TABLET BY MOUTH ONCE  DAILY   Yes Historical Provider, MD  furosemide (LASIX) 40 MG tablet Take 1 tablet (40 mg total) by mouth 2 (two) times daily. Please keep upcoming appointment for further refills 05/06/15  Yes Larey Dresser, MD  HYDROcodone-acetaminophen [facility]) 10-325 MG tablet Take 1 tablet by mouth every 6 (six) hours as needed for moderate pain or severe pain. 06/09/15  Yes Alycia Rossetti, MD  naproxen sodium (ALEVE) 220 MG tablet Take 440 mg by mouth 2 (two) times daily with a meal.   Yes Historical Provider, MD  omeprazole (PRILOSEC) 20 MG capsule Take 1 capsule (20 mg total) by mouth daily. 06/05/15  Yes Scott Joylene Draft, PA-C  oxybutynin (DITROPAN-XL) 5 MG 24 hr tablet Take 5 mg by mouth at bedtime.   Yes Historical Provider, MD  predniSONE (DELTASONE) 10 MG tablet Take 10 mg by mouth daily as needed (FOR PAIN).   Yes Historical Provider, MD  PROAIR HFA 108 (90 BASE) MCG/ACT inhaler  USE 1 TO 2 INHALATIONS EVERY 6 HOURS AS NEEDED FOR WHEEZING OR SHORTNESS OF BREATH 10/07/14  Yes [name], MD  rOPINIRole (REQUIP) 1 MG tablet Take 1 mg by mouth at bedtime.    Yes Historical Provider, MD  SPIRIVA HANDIHALER 18 MCG inhalation capsule INHALE THE CONTENTS OF 1 CAPSULE WITH 2         INHALATIONS ONCE DAILY AS DIRECTED   Yes [name], MD  venlafaxine XR (EFFEXOR-XR) 150 MG 24 hr capsule Take 150 mg by mouth 2 (two) times daily.    Yes Historical Provider, MD  vitamin B-12 (CYANOCOBALAMIN) 1000 MCG tablet Take 1,000 mcg by mouth daily.   Yes Historical Provider, MD    Physical Exam: Filed Vitals:   06/15/15 1055 06/15/15 1310 06/15/15 1330 06/15/15 1400  BP: 151/91 163/90 181/89 142/101  Pulse: 70 66 69 68  Resp: 16 16 11 10   SpO2: 96% 96% 97% 97%      Constitutional: NAD, calm, comfortable Eyes: PERRL, lids and conjunctivae normal ENMT: Mucous membranes are moist. Posterior pharynx clear of any exudate or lesions.Normal dentition.  Neck: normal, supple, no masses, no thyromegaly Respiratory: clear to auscultation bilaterally, no wheezing, no crackles. Normal respiratory effort. No accessory muscle use.  Cardiovascular: Regular rate and rhythm, no murmurs / rubs / gallops. No extremity edema. 2+ pedal pulses. No carotid bruits auscultated.  Abdomen: no tenderness, no masses palpated. No hepatosplenomegaly. Bowel sounds positive.  Musculoskeletal: no clubbing / cyanosis. No joint deformity upper and lower extremities. Good ROM, no contractures. Normal muscle tone.  Skin: no rashes, lesions, ulcers. No induration Neurologic: CN 2-12 grossly intact. Sensation intact, DTR normal. Strength 5/5 x all 4 extremities. Mild slurred speech, when asked to perform motor activities as part of neuro exam patient would repeat the same activity until tactile stimulated to stop Psychiatric: Alert and oriented 3 for the most part although still seems somewhat mildly inappropriate  in response to information he requires admission   Labs on Admission: I have personally reviewed following labs and imaging studies  CBC:  Recent Labs Lab 06/15/15 1010  WBC 9.3  NEUTROABS 5.2  HGB 14.3  HCT 45.4  MCV 94.8  PLT [id number]   Basic Metabolic Panel:  Recent Labs Lab 06/15/15 1010  NA 137  K 4.6  CL 106  CO2 21*  GLUCOSE 119*  BUN 25*  CREATININE 1.20  CALCIUM 9.1   GFR: Estimated Creatinine Clearance: 66.8 mL/min (by C-G formula based on Cr of 1.2). Liver Function Tests:  Recent Labs Lab 06/15/15 1010  AST 24  ALT 23  ALKPHOS 72  BILITOT 0.6  PROT 7.2  ALBUMIN 4.0   No results for input(s): LIPASE, AMYLASE in the last 168 hours.  Recent Labs Lab 06/15/15 1037  AMMONIA 38*   Coagulation Profile:  Recent Labs Lab 06/15/15 1010  INR 0.95   Cardiac Enzymes: No results for input(s): CKTOTAL, CKMB, CKMBINDEX, TROPONINI in the last 168 hours. BNP (last 3 results) No results for input(s): PROBNP in the last 8760 hours. HbA1C: No results for input(s): HGBA1C in the last 72 hours. CBG:  Recent Labs Lab 06/15/15 0956  GLUCAP 123*   Lipid Profile: No results for input(s): CHOL, HDL, LDLCALC, TRIG, CHOLHDL, LDLDIRECT in the last 72 hours. Thyroid Function Tests: No results for input(s): TSH, T4TOTAL, FREET4, T3FREE, THYROIDAB in the last 72 hours. Anemia Panel: No results for input(s): VITAMINB12, FOLATE, FERRITIN, TIBC, IRON, RETICCTPCT in the last 72 hours. Urine analysis:    Component Value Date/Time   COLORURINE YELLOW 07/04/2013 1050   APPEARANCEUR CLOUDY* 07/04/2013 1050   LABSPEC 1.010 07/04/2013 1050   PHURINE 6.5 07/04/2013 1050   GLUCOSEU NEG 07/04/2013 1050   HGBUR SMALL* 07/04/2013 1050   BILIRUBINUR NEG 07/04/2013 1050   KETONESUR NEG 07/04/2013 1050   PROTEINUR NEG 07/04/2013 1050   UROBILINOGEN 0.2 07/04/2013 1050   NITRITE NEG 07/04/2013 1050   LEUKOCYTESUR MODERATE 07/04/2013 1050   Sepsis  Labs: @LABRCNTIP (procalcitonin:4,lacticidven:4) )No results found for this or any previous visit (from the past 240 hour(s)).   Radiological Exams on Admission: Ct Head Wo Contrast  06/15/2015  CLINICAL DATA:  Slurred speech and confusion since Friday. History of dementia and multiple sclerosis. EXAM: CT HEAD WITHOUT CONTRAST TECHNIQUE: Contiguous axial images were obtained from the base of the skull through the vertex without intravenous contrast. COMPARISON:  08/12/2009 MRI FINDINGS: Sinuses/Soft tissues: Clear paranasal sinuses and mastoid air cells. Intracranial: Moderate periventricular white matter hypoattenuation is grossly similar to the prior MRI, given cross modality comparison. Mild cerebral and cerebellar atrophy. No mass lesion, hemorrhage, hydrocephalus, acute infarct, intra-axial, or extra-axial fluid collection. IMPRESSION: 1.  No acute intracranial abnormality. 2. Periventricular white matter hypoattenuation may relate to the clinical history of multiple sclerosis and/or chronic small vessel ischemic change. 3. Cerebral and cerebellar atrophy. Electronically Signed   By: Abigail Miyamoto M.D.   On: 06/15/2015 11:03   Mr Jeri Cos [id number] Contrast  06/15/2015  CLINICAL DATA:  Acute mental status changes. Headache. Numbness and tingling. History of multiple sclerosis. EXAM: MRI HEAD WITHOUT AND WITH CONTRAST TECHNIQUE: Multiplanar, multiecho pulse sequences of the brain and surrounding structures were obtained without and with intravenous contrast. CONTRAST:  50mL MULTIHANCE GADOBENATE DIMEGLUMINE 529 MG/ML IV SOLN COMPARISON:  CT same day.  MRI 08/12/2009. FINDINGS: Diffusion imaging shows scattered foci of restricted diffusion in the insular and posterior frontal region consistent with right MCA branch vessel  territory infarction. No other vascular territory acute infarction. No swelling or hemorrhage. The brainstem is normal. There are old small vessel cerebellar infarctions. Cerebral hemispheres  show extensive chronic small vessel ischemic changes throughout the deep white matter. Given the history of multiple sclerosis, some of these white matter lesions could relate to chronic demyelination. No mass lesion, hemorrhage, hydrocephalus or extra-axial collection. No pituitary mass. No inflammatory sinus disease. No skull or skullbase lesion. Major vessels at the base of the brain show flow. After contrast administration, no abnormal enhancement occurs. IMPRESSION: Areas of acute infarction in the region of the insula and posterior frontal lobe on the right consistent with right MCA branch vessel occlusion. No swelling or hemorrhage. Extensive chronic small vessel ischemic changes elsewhere throughout the brain, progressive since the previous exam. Given the history of multiple sclerosis clinically, some of these white matter lesions could relate to chronic demyelination. Electronically Signed   By: Nelson Chimes M.D.   On: 06/15/2015 13:36    EKG: (Independently reviewed) sinus rhythm with ventricular rate 80 bpm, QTC 419 ms, incomplete right bundle-branch block, no ischemic changes  Assessment/Plan Principal Problem:   Acute right MCA stroke  -Patient presents with symptoms ongoing for 2 days therefore out of the window for TPA -he presents  without any major unifocal symptoms although does have gait ataxia and repetitive motions as well as persistent slurred speech -Formal neurological evaluation pending -Obtain MRA brain, carotid duplex, echocardiogram -PT/OT/SLP evaluations -Antiplatelet with full dose aspirin noting patient was on baby aspirin prior to admission -Hemoglobin A1c and lipid panelAnd other risk factor modification  Active Problems:   Essential hypertension -Moderate control but in setting of acute stroke will allow for permissive hypertension -Hold Norvasc and decrease preadmission bisoprolol dose by 50%    Chronic diastolic CHF (congestive heart failure)   -Compensated -Continue preadmission Lasix    COPD (chronic obstructive pulmonary disease)  -Compensated -Continues for Reva and pro-air    Hyperlipidemia -Continue Lipitor    Depression -Continue Effexor and Abilify    BPH (benign prostatic hyperplasia) -Continue Ditropan    CAD (coronary artery disease) -Currently asymptomatic -Continue aspirin, beta blocker and statin    Gastroesophageal reflux disease without esophagitis -Continue PPI      DVT prophylaxis: Lovenox  Code Status: Full code  Family Communication: Wife at bedside  Disposition Plan: Anticipate discharge to preadmission home environment was medically stable  Consults called: Neurology/K[place]patrick  Admission status: Inpatient/telemetry    Samella Parr ANP-BC [facility] Pager [phone]   If 7PM-7AM, please contact night-coverage [email or web address] Password [facility]  06/15/2015, 2:24 PM

## 2015-06-15 NOTE — Progress Notes (Signed)
Patient accepted from ED. Patient is A&OX4, denies having any pain, patient is very drowsy but easily arousable.  Wife at his bedside.

## 2015-06-15 NOTE — ED Notes (Signed)
Patient transported to CT 

## 2015-06-15 NOTE — Consult Note (Signed)
Neurology Consultation Reason for Consult: Stroke Referring Physician: Stark Jock, D  CC: Stroke  History is obtained from: Patient  HPI: [name] [name] is a 75 y.o. male with a history of multiple sclerosis who is not on any treatmentt. He has been diagnosed with primary progressive MS and therefore is not on any disease modifying agents. For the past few days, his wife has noticed slurred speech, repetitive speech. He is brought in for evaluation of this today and an MRI was performed which shows an acute infarct in the nondominant hemisphere, MCA distribution.   He states that the symptoms have been fairly persistent, not getting much better or worse. He denies any new arm or leg weakness or numbness.   LKW: Several days ago tpa given?: no, out of window    ROS: A 14 point ROS was performed and is negative except as noted in the HPI.   Past Medical History  Diagnosis Date  . MS (multiple sclerosis) ([place])   . Arthritis     s/p TKR  . Right bundle branch block   . Brugada syndrome     Possible Type II Brugada ECG pattern. No family history of SCD, no syncope, no tachypalpitations.  Marland Kitchen COPD (chronic obstructive pulmonary disease) ([place])     Prior heavy smoker. PFTs (3/11): FVC 87%, FEV1 73%, ratio 0.57, DLCO 75%, TLC 121%. Moderate obstructive defect. PFTs (9/15): Only minimal obstruction, sugPrior heavy smoker. PFTs (3/11): FVC 87%, FEV1 73%, ratio 0.57, DLCO 75%, TLC 121%. Moderate obstructive defect. PFTs (9/15) minimal obstruction, poss asthma component   . GERD (gastroesophageal reflux disease)   . Depression     with bipolar tendencies  . Urinary retention with incomplete bladder emptying     receives botox injections and treatment for BPH through [facility]  . BPH (benign prostatic hyperplasia)   . CAD (coronary artery disease)     a. LHC 1/15 - mid LCx 50 and 60%, proximal RCA 50%  . HLD (hyperlipidemia)   . HTN (hypertension)   . Chronic diastolic CHF (congestive heart failure)  ([place])   . LVH (left ventricular hypertrophy)     a. Echo 12/14 Inferior and distal septal HK, mild LVH, EF 50-55%, mild MR, mild LAE  . Bladder cancer (HCC)     [facility], Ta low grade papillary urothileal carcinoma  . DOE (dyspnea on exertion)     a. Myoview 8/09- EF 57%, no ischemia // b. Myoview (3/11) EF 68%, diaphragmatic attenuation, no ischemia //  c Echo (4/11) EF 0000000, mild diastolic dysfunction, PASP 38 mmHg // d. PFTs 9/15 minimal obstruction  /  e. CT negative for ILD  . Mitral regurgitation     Echo (4/11) with PISA ERO 0.3 cm^2 and regurgitant volume 41 mL (moderate MR). Echo (12/14) with mild MR.   Marland Kitchen History of Doppler ultrasound     Carotid US 3/11 negative for significant stenosis  . Mild dementia   . Low testosterone   . CKD (chronic kidney disease)   . Lung nodule     a. CT in 2015 >> PET in 12/15 not sugg of malignancy   . Thoracic aortic aneurysm (HCC)     4.1 cm 2017     Family History  Problem Relation Age of Onset  . Stroke Mother   . Stroke Father   . Hypertension Sister   . Kidney cancer Brother   . Arthritis Brother   . Other Brother     knee problems, Bil TKR  .  Hypertension      family history     Social History:  reports that he quit smoking about 22 years ago. His smoking use included Cigarettes. He has a 80 pack-year smoking history. He has quit using smokeless tobacco. His smokeless tobacco use included Chew. He reports that he drinks alcohol. His drug history is not on file.   Exam: Current vital signs: BP 173/71 mmHg  Pulse 71  Temp(Src) 98.7 F (37.1 C) (Oral)  Resp 10  Ht 6' (1.829 m)  Wt 99.791 kg (220 lb)  BMI 29.83 kg/m2  SpO2 94% Vital signs in last 24 hours: Temp:  [98.6 F (37 C)-98.7 F (37.1 C)] 98.7 F (37.1 C) (06/11 1730) Pulse Rate:  [63-71] 71 (06/11 1730) Resp:  [10-16] 10 (06/11 1400) BP: (142-181)/(71-101) 173/71 mmHg (06/11 1730) SpO2:  [94 %-97 %] 94 % (06/11 1730) Weight:  [99.791 kg (220 lb)] 99.791 kg  (220 lb) (06/11 1530)   Physical Exam  Constitutional: Appears well-developed and well-nourished.  Psych: Affect appropriate to situation Eyes: No scleral injection HENT: No OP obstrucion Head: Normocephalic.  Cardiovascular: Normal rate and regular rhythm.  Respiratory: Effort normal and breath sounds normal to anterior ascultation GI: Soft.  No distension. There is no tenderness.  Skin: WDI  Neuro: Mental Status: Patient is awake, alert, oriented to person, place, year, and situation. He is unable to give me the month. He has repetitive speech, with some perseveration. Cranial Nerves: II: Visual Fields are full. Pupils are equal, round, and reactive to light.   III,IV, VI: EOMI without ptosis or diploplia.  V: Facial sensation is symmetric to temperature VII: Facial movement is notable for mild left facial weakness VIII: hearing is intact to voice X: Uvula elevates symmetrically XI: Shoulder shrug is symmetric. XII: tongue is midline without atrophy or fasciculations.  Motor: Tone is normal. Bulk is normal. 5/5 strength was present in all four extremities. No drift Sensory: Sensation is symmetric to light touch and temperature in the arms and legs. Cerebellar: FNF intact bilaterally   I have reviewed labs in epic and the results pertinent to this consultation are: CMP-unremarkable  I have reviewed the images obtained: MRI brain-acute infarction in the distribution of a MCA branch vessel  Impression: 75 year old male with a new likely embolic MCA distribution infarct. He'll need to be admitted for further workup.  Recommendations: 1. HgbA1c, fasting lipid panel 2. MRI, MRA  of the brain without contrast 3. Frequent neuro checks 4. Echocardiogram 5. Carotid dopplers 6. Prophylactic therapy-Antiplatelet med: Aspirin - dose 325mg  PO or 300mg  PR 7. Risk factor modification 8. Telemetry monitoring 9. PT consult, OT consult, Speech consult 10. please page stroke NP  Or   PA  Or MD  M-F from 8am -4 pm starting 6/12 as this patient will be followed by the stroke team at this point.   You can look them up on [email or web address]      Roland Rack, MD [facility] [phone]  If 7pm- 7am, please page neurology on call as listed in [place].

## 2015-06-15 NOTE — ED Notes (Signed)
Patient transported to MRI 

## 2015-06-15 NOTE — ED Provider Notes (Signed)
CSN: [id number]     Arrival date & time 06/15/15  [id number] History   First MD Initiated Contact with Patient 06/15/15 1019     Chief Complaint  Patient presents with  . slurred speech   . Altered Mental Status     (Consider location/radiation/quality/duration/timing/severity/associated sxs/prior Treatment) HPI Comments: Patient is a 75 year old male with past medical history of MS, CHF, coronary artery disease, and hypertension. He presents for evaluation of behavioral changes. This started 2 nights ago and has gradually worsened. The wife states that he has been confused, is slow to process things, and his speech has been increasingly garbled. The patient denies to me that he is having any pain, shortness of breath, headache, or other complaints.  Patient is a 75 y.o. male presenting with altered mental status. The history is provided by the patient and the spouse.  Altered Mental Status Presenting symptoms: behavior changes and confusion   Severity:  Moderate Episode history:  Single Duration:  2 days Timing:  Constant Progression:  Worsening Chronicity:  New Context: not alcohol use, not dementia and not drug use     Past Medical History  Diagnosis Date  . MS (multiple sclerosis) ([place])   . Arthritis     s/p TKR  . Right bundle branch block   . Brugada syndrome     Possible Type II Brugada ECG pattern. No family history of SCD, no syncope, no tachypalpitations.  Marland Kitchen COPD (chronic obstructive pulmonary disease) ([place])     Prior heavy smoker. PFTs (3/11): FVC 87%, FEV1 73%, ratio 0.57, DLCO 75%, TLC 121%. Moderate obstructive defect. PFTs (9/15): Only minimal obstruction, sugPrior heavy smoker. PFTs (3/11): FVC 87%, FEV1 73%, ratio 0.57, DLCO 75%, TLC 121%. Moderate obstructive defect. PFTs (9/15) minimal obstruction, poss asthma component   . GERD (gastroesophageal reflux disease)   . Depression     with bipolar tendencies  . Urinary retention with incomplete bladder emptying    receives botox injections and treatment for BPH through [facility]  . BPH (benign prostatic hyperplasia)   . CAD (coronary artery disease)     a. LHC 1/15 - mid LCx 50 and 60%, proximal RCA 50%  . HLD (hyperlipidemia)   . HTN (hypertension)   . Chronic diastolic CHF (congestive heart failure) ([place])   . LVH (left ventricular hypertrophy)     a. Echo 12/14 Inferior and distal septal HK, mild LVH, EF 50-55%, mild MR, mild LAE  . Bladder cancer (HCC)     [facility], Ta low grade papillary urothileal carcinoma  . DOE (dyspnea on exertion)     a. Myoview 8/09- EF 57%, no ischemia // b. Myoview (3/11) EF 68%, diaphragmatic attenuation, no ischemia //  c Echo (4/11) EF 0000000, mild diastolic dysfunction, PASP 38 mmHg // d. PFTs 9/15 minimal obstruction  /  e. CT negative for ILD  . Mitral regurgitation     Echo (4/11) with PISA ERO 0.3 cm^2 and regurgitant volume 41 mL (moderate MR). Echo (12/14) with mild MR.   Marland Kitchen History of Doppler ultrasound     Carotid US 3/11 negative for significant stenosis  . Mild dementia   . Low testosterone   . CKD (chronic kidney disease)   . Lung nodule     a. CT in 2015 >> PET in 12/15 not sugg of malignancy   . Thoracic aortic aneurysm (HCC)     4.1 cm 2017   Past Surgical History  Procedure Laterality Date  . Total knee arthroplasty Right   .  Transurethral resection of prostate    . Tonsillectomy    . Appendectomy    . Rotator cuff repair    . Hemorrhoid surgery    . Left heart catheterization with coronary angiogram N/A 01/05/2013    Procedure: LEFT HEART CATHETERIZATION WITH CORONARY ANGIOGRAM;  Surgeon: Larey Dresser, MD;  Location[facility] C[facility] LAB;  Service: Cardiovascular;  Laterality: N/A;   Family History  Problem Relation Age of Onset  . Stroke Mother   . Stroke Father   . Hypertension Sister   . Kidney cancer Brother   . Arthritis Brother   . Other Brother     knee problems, Bil TKR  . Hypertension      family history   Social History  Substance  Use Topics  . Smoking status: Former Smoker -- 2.00 packs/day for 40 years    Types: Cigarettes    Quit date: 01/04/1993  . Smokeless tobacco: Former Systems developer    Types: Chew  . Alcohol Use: Yes     Comment: 6 pack/day    Review of Systems  Psychiatric/Behavioral: Positive for confusion.  All other systems reviewed and are negative.     Allergies  Acetylcholine; Alcohol-sulfur; Amitriptyline; Bupivacaine; Clomipramine hcl; Cocaine; Desipramine; Ergonovine; Flecainide; Lithium; Loxapine; Nortriptyline; Oxcarbazepine; Procainamide; Procaine; Propafenone; Propofol; and Trifluoperazine  Home Medications   Prior to Admission medications   Medication Sig Start Date End Date Taking? Authorizing Provider  amLODipine (NORVASC) 5 MG tablet Take 5 mg by mouth daily.    Historical Provider, MD  ARIPiprazole (ABILIFY) 5 MG tablet TAKE 1 TABLET BY MOUTH EVERY DAY 09/30/14   [name], MD  aspirin EC 81 MG tablet Take 81 mg by mouth every other day.     Historical Provider, MD  atorvastatin (LIPITOR) 20 MG tablet Take 20 mg by mouth daily.    Historical Provider, MD  bisoprolol (ZEBETA) 5 MG tablet TAKE ONE-HALF (1/2) TABLET BY MOUTH ONCE  DAILY    Historical Provider, MD  furosemide (LASIX) 40 MG tablet Take 1 tablet (40 mg total) by mouth 2 (two) times daily. Please keep upcoming appointment for further refills 05/06/15   Larey Dresser, MD  HYDROcodone-acetaminophen [facility]) 10-325 MG tablet Take 1 tablet by mouth every 6 (six) hours as needed for moderate pain or severe pain. 06/09/15   Alycia Rossetti, MD  NAPROXEN PO Take 600 mg by mouth daily.    Historical Provider, MD  omeprazole (PRILOSEC) 20 MG capsule Take 1 capsule (20 mg total) by mouth daily. 06/05/15   Liliane Shi, PA-C  oxybutynin (DITROPAN) 5 MG tablet TAKE 1 TABLET BY MOUTH EVERY 8 HOURS AS NEEDED FOR BLADDER SPASMS 05/13/14   Historical Provider, MD  predniSONE (DELTASONE) 10 MG tablet Take 10 mg by mouth daily as needed (FOR  PAIN).    Historical Provider, MD  PROAIR HFA 108 (90 BASE) MCG/ACT inhaler USE 1 TO 2 INHALATIONS EVERY 6 HOURS AS NEEDED FOR WHEEZING OR SHORTNESS OF BREATH 10/07/14   [name], MD  rOPINIRole (REQUIP) 1 MG tablet Take 1 mg by mouth daily.     Historical Provider, MD  SPIRIVA HANDIHALER 18 MCG inhalation capsule INHALE THE CONTENTS OF 1 CAPSULE WITH 2         INHALATIONS ONCE DAILY AS DIRECTED    [name], MD  venlafaxine XR (EFFEXOR-XR) 150 MG 24 hr capsule Take 150 mg by mouth 2 (two) times daily.     Historical Provider, MD  There were no vitals taken for this visit. Physical Exam  Constitutional: He is oriented to person, place, and time. He appears well-developed and well-nourished. No distress.  HENT:  Head: Normocephalic and atraumatic.  Mouth/Throat: Oropharynx is clear and moist.  Eyes: EOM are normal. Pupils are equal, round, and reactive to light.  Neck: Normal range of motion. Neck supple.  Cardiovascular: Normal rate and regular rhythm.  Exam reveals no friction rub.   No murmur heard. Pulmonary/Chest: Effort normal and breath sounds normal. No respiratory distress. He has no wheezes. He has no rales.  Abdominal: Soft. Bowel sounds are normal. He exhibits no distension. There is no tenderness.  Musculoskeletal: Normal range of motion. He exhibits no edema.  Neurological: He is alert and oriented to person, place, and time. No cranial nerve deficit. He exhibits normal muscle tone. Coordination abnormal.  The patient is awake, alert, and appropriate. He is somewhat slow to speak and follow commands, but is appropriate when doing so. His speech is slowed, slurred, and at times incomprehensible.  Skin: Skin is warm and dry. He is not diaphoretic.  Nursing note and vitals reviewed.   ED Course  Procedures (including critical care time) Labs Review Labs Reviewed  CBG MONITORING, ED - Abnormal; Notable for the following:    Glucose-Capillary 123 (*)    All other  components within normal limits  PROTIME-INR  APTT  CBC  DIFFERENTIAL  COMPREHENSIVE METABOLIC PANEL  I-STAT TROPOININ, ED  CBG MONITORING, ED    Imaging Review No results found. I have personally reviewed and evaluated these images and lab results as part of my medical decision-making.   EKG Interpretation   Date/Time:  Sunday June 15 2015 09:56:58 EDT Ventricular Rate:  80 PR Interval:  180 QRS Duration: 108 QT Interval:  364 QTC Calculation: 419 R Axis:   -58 Text Interpretation:  Incomplete right bundle branch block Left anterior  fascicular block Abnormal ECG Confirmed by Garlen Reinig  MD, Mikael ([id number]) on  06/15/2015 10:09:02 AM      MDM   Final diagnoses:  None    Patient presents with complaints of slowed speech and receptive and expressive dysphasia. His workup reveals evidence for an acute stroke on his MRI. I have discussed this with Dr. Leonel Ramsay from neurology who is requesting the patient be admitted to the hospitalist service. He will be admitted under the care of Dr. Eliseo Squires.    Veryl Speak, MD 06/15/15 1414

## 2015-06-15 NOTE — ED Notes (Signed)
Spouse concerned that patient has slurred speech and confusion since Friday night. Also reports lots of repetitive motions since onset as well. Denies trauma. No obvious neuro deficits. Alert and oriented to person, place confused to date

## 2015-06-15 NOTE — Progress Notes (Signed)
Patient has been observed to cough after eating and drinking since he admitted to floor. Patient and wife confirmed that this is new to him-in the "last couple of days". MD notified-NPO is ordered immediately  until ST has evaluated patient. Will continue to monitor.

## 2015-06-16 ENCOUNTER — Encounter (HOSPITAL_COMMUNITY): Payer: Self-pay | Admitting: Radiology

## 2015-06-16 ENCOUNTER — Inpatient Hospital Stay (HOSPITAL_COMMUNITY): Payer: Medicare Other

## 2015-06-16 DIAGNOSIS — I63511 Cerebral infarction due to unspecified occlusion or stenosis of right middle cerebral artery: Secondary | ICD-10-CM

## 2015-06-16 DIAGNOSIS — I6789 Other cerebrovascular disease: Secondary | ICD-10-CM

## 2015-06-16 LAB — ECHOCARDIOGRAM COMPLETE
AVPHT: 412 ms
EERAT: 7.53
EWDT: 236 ms
FS: 35 % (ref 28–44)
Height: 72 in
IVS/LV PW RATIO, ED: 1.24
LA ID, A-P, ES: 32 mm
LA diam end sys: 32 mm
LA diam index: 1.44 cm/m2
LA vol A4C: 27.9 ml
LA vol: 32.3 mL
LAVOLIN: 14.5 mL/m2
LV E/e' medial: 7.53
LV PW d: 11.2 mm — AB (ref 0.6–1.1)
LV TDI E'LATERAL: 6.31
LV TDI E'MEDIAL: 5.98
LVEEAVG: 7.53
LVELAT: 6.31 cm/s
LVOT area: 2.84 cm2
LVOTD: 19 mm
MV Dec: 236
MV pk A vel: 75.2 m/s
MV pk E vel: 47.5 m/s
Weight: 3520 oz

## 2015-06-16 LAB — LIPID PANEL
CHOLESTEROL: 185 mg/dL (ref 0–200)
HDL: 43 mg/dL (ref >40)
LDL Cholesterol: 91 mg/dL (ref 0–99)
TRIGLYCERIDES: 256 mg/dL — AB (ref <150)
Total CHOL/HDL Ratio: 4.3 RATIO
VLDL: 51 mg/dL — AB (ref 0–40)

## 2015-06-16 MED ORDER — IOPAMIDOL (ISOVUE-370) INJECTION 76%
INTRAVENOUS | Status: AC
  Administered 2015-06-16: 50 mL
  Filled 2015-06-16: qty 50

## 2015-06-16 MED ORDER — DIPHENHYDRAMINE HCL 12.5 MG/5ML PO ELIX: 12.5000 mg | ORAL_SOLUTION | Freq: Once | ORAL | Status: DC | PRN

## 2015-06-16 NOTE — Progress Notes (Signed)
  Echocardiogram 2D Echocardiogram has been performed.  [name] [name] 06/16/2015, 11:40 AM

## 2015-06-16 NOTE — Progress Notes (Signed)
Rehab Admissions Coordinator Note:  Patient was screened by Cleatrice Burke for appropriateness for an Inpatient Acute Rehab Consult per PT, OT and SLP recommendations.   At this time, we are recommending Inpatient Rehab consult. Please place an inpt rehab consult order if you would like pt considered for an inpt rehab admission.  Cleatrice Burke 06/16/2015, 7:29 PM  I can be reached at [phone].

## 2015-06-16 NOTE — Evaluation (Signed)
Clinical/Bedside Swallow Evaluation Patient Details  Name: [name] [name] MRN: [id number] Date of Birth: [birth year]  Today's Date: 06/16/2015 Time: SLP Start Time (ACUTE ONLY): 1210 SLP Stop Time (ACUTE ONLY): 1230 SLP Time Calculation (min) (ACUTE ONLY): 20 min  Past Medical History:  Past Medical History  Diagnosis Date  . MS (multiple sclerosis) ([place])   . Arthritis     s/p TKR  . Right bundle branch block   . Brugada syndrome     Possible Type II Brugada ECG pattern. No family history of SCD, no syncope, no tachypalpitations.  Marland Kitchen COPD (chronic obstructive pulmonary disease) ([place])     Prior heavy smoker. PFTs (3/11): FVC 87%, FEV1 73%, ratio 0.57, DLCO 75%, TLC 121%. Moderate obstructive defect. PFTs (9/15): Only minimal obstruction, sugPrior heavy smoker. PFTs (3/11): FVC 87%, FEV1 73%, ratio 0.57, DLCO 75%, TLC 121%. Moderate obstructive defect. PFTs (9/15) minimal obstruction, poss asthma component   . GERD (gastroesophageal reflux disease)   . Depression     with bipolar tendencies  . Urinary retention with incomplete bladder emptying     receives botox injections and treatment for BPH through [facility]  . BPH (benign prostatic hyperplasia)   . CAD (coronary artery disease)     a. LHC 1/15 - mid LCx 50 and 60%, proximal RCA 50%  . HLD (hyperlipidemia)   . HTN (hypertension)   . Chronic diastolic CHF (congestive heart failure) ([place])   . LVH (left ventricular hypertrophy)     a. Echo 12/14 Inferior and distal septal HK, mild LVH, EF 50-55%, mild MR, mild LAE  . Bladder cancer (HCC)     [facility], Ta low grade papillary urothileal carcinoma  . DOE (dyspnea on exertion)     a. Myoview 8/09- EF 57%, no ischemia // b. Myoview (3/11) EF 68%, diaphragmatic attenuation, no ischemia //  c Echo (4/11) EF 0000000, mild diastolic dysfunction, PASP 38 mmHg // d. PFTs 9/15 minimal obstruction  /  e. CT negative for ILD  . Mitral regurgitation     Echo (4/11) with PISA ERO 0.3 cm^2 and regurgitant  volume 41 mL (moderate MR). Echo (12/14) with mild MR.   Marland Kitchen History of Doppler ultrasound     Carotid US 3/11 negative for significant stenosis  . Mild dementia   . Low testosterone   . CKD (chronic kidney disease)   . Lung nodule     a. CT in 2015 >> PET in 12/15 not sugg of malignancy   . Thoracic aortic aneurysm (HCC)     4.1 cm 2017   Past Surgical History:  Past Surgical History  Procedure Laterality Date  . Total knee arthroplasty Right   . Transurethral resection of prostate    . Tonsillectomy    . Appendectomy    . Rotator cuff repair    . Hemorrhoid surgery    . Left heart catheterization with coronary angiogram N/A 01/05/2013    Procedure: LEFT HEART CATHETERIZATION WITH CORONARY ANGIOGRAM;  Surgeon: Larey Dresser, MD;  Location: [facility] CATH LAB;  Service: Cardiovascular;  Laterality: N/A;   HPI:  [name] [name] is a 75 y.o. male with medical history significant for multiple sclerosis, chronic diastolic heart failure on Lasix, hypertension, CAD, COPD, dyslipidemia, BPH, depression and GERD. Patient presented to the hospital today after developing slurred speech and mild confusion as well as mild gait ataxia since this past Friday. Evaluation in the ER was consistent with right MCA stroke on MRI. Formal neurological evaluation pending. Patient  presented greater than 8 hours after symptoms began secure is not a candidate for thrombolytic therapy.  MRI showing areas of acute infarction in the region of the insula and posterior frontal lobe on the right consistent with right MCA branch vessel occlusion. No swelling or hemorrhage.   Assessment / Plan / Recommendation Clinical Impression  Clinical swallowing evaluation was completed using thin liquids via spoon, cup and straw sips, pureed material and dry solids.  The patient had passed the nursing swallow screen but was observed to cough given his dinner tray and was subsequently made NPO.  Oral mechanism exam was unremarkable.  The  patient's oral and pharyngeal appeared to be functional.  Swallow trigger was timely and hyo-laryngeal excursion was judged to be adequate.  Mastication of dry solids was functional.  Recommend a regular diet with thin liquids.  ST to follow up for therapeutic diet tolerance given new CVA and history of MS.      Aspiration Risk  Mild aspiration risk    Diet Recommendation   Regular with thin liquids.    Medication Administration: Whole meds with liquid    Other  Recommendations Oral Care Recommendations: Oral care BID   Follow up Recommendations  Inpatient Rehab (to address cognitive deficits)    Frequency and Duration min 2x/week  2 weeks        Swallow Study   General Date of Onset: 06/15/15 HPI: [name] [name] is a 75 y.o. male with medical history significant for multiple sclerosis, chronic diastolic heart failure on Lasix, hypertension, CAD, COPD, dyslipidemia, BPH, depression and GERD. Patient presented to the hospital today after developing slurred speech and mild confusion as well as mild gait ataxia since this past Friday. Evaluation in the ER was consistent with right MCA stroke on MRI. Formal neurological evaluation pending. Patient presented greater than 8 hours after symptoms began secure is not a candidate for thrombolytic therapy.  MRI showing areas of acute infarction in the region of the insula and posterior frontal lobe on the right consistent with right MCA branch vessel occlusion. No swelling or hemorrhage. Type of Study: Bedside Swallow Evaluation Previous Swallow Assessment: None noted.   Diet Prior to this Study: NPO Temperature Spikes Noted: No Respiratory Status: Room air History of Recent Intubation: No Behavior/Cognition: Alert;Cooperative;Pleasant mood Oral Cavity Assessment: Within Functional Limits Oral Care Completed by SLP: No Oral Cavity - Dentition: Adequate natural dentition Vision: Functional for self-feeding Self-Feeding Abilities: Able to feed  self Patient Positioning: Upright in chair Baseline Vocal Quality: Normal Volitional Cough: Strong Volitional Swallow: Able to elicit    Oral/Motor/Sensory Function Overall Oral Motor/Sensory Function: Within functional limits   Ice Chips Ice chips: Not tested   Thin Liquid Thin Liquid: Within functional limits Presentation: Cup;Spoon;Straw;Self Fed    Nectar Thick Nectar Thick Liquid: Not tested   Honey Thick Honey Thick Liquid: Not tested   Puree Puree: Within functional limits Presentation: Self Fed;Spoon   Solid   GO   Solid: Within functional limits Presentation: Self Fed    Functional Assessment Tool Used: ASHA NOMS and clinical judgment.   Functional Limitations: Memory Memory Current Status [phone]): At least 20 percent but less than 40 percent impaired, limited or restricted Memory Goal Status [id number]): At least 20 percent but less than 40 percent impaired, limited or restricted    Shelly Flatten, MA, [place] [phone] Shelly Flatten N 06/16/2015,1:53 PM

## 2015-06-16 NOTE — Progress Notes (Signed)
VASCULAR LAB PRELIMINARY  PRELIMINARY  PRELIMINARY  PRELIMINARY  Carotid duplex completed.    Preliminary report:  1-39% ICA plaquing.  Vertebral artery flow is antegrade.   Keyundra Fant, RVT 06/16/2015, 11:34 AM

## 2015-06-16 NOTE — Evaluation (Signed)
Speech Language Pathology Evaluation Patient Details Name: [name] [name] MRN: [id number] DOB: [birth year] Today's Date: 06/16/2015 Time: [id number] SLP Time Calculation (min) (ACUTE ONLY): 45 min  Problem List:  Patient Active Problem List   Diagnosis Date Noted  . Acute right MCA stroke ([place]) 06/15/2015  . Thoracic aortic aneurysm ([place])   . Hyperlipidemia 06/05/2015  . Gastroesophageal reflux disease without esophagitis 06/05/2015  . COPD (chronic obstructive pulmonary disease) ([place]) 09/04/2013  . Lung nodule 09/04/2013  . Cancer ([place])   . CAD (coronary artery disease) 02/01/2013  . Chronic diastolic CHF (congestive heart failure) ([place]) 01/03/2013  . Depression   . Urinary retention with incomplete bladder emptying   . BPH (benign prostatic hyperplasia)   . Essential hypertension 04/08/2009  . Mitral valve disorders 04/08/2009  . CAROTID BRUIT, RIGHT 03/06/2009  . SHORTNESS OF BREATH 03/06/2009  . OTHER CHEST PAIN 03/06/2009  . MULTIPLE SCLEROSIS 10/11/2006   Past Medical History:  Past Medical History  Diagnosis Date  . MS (multiple sclerosis) ([place])   . Arthritis     s/p TKR  . Right bundle branch block   . Brugada syndrome     Possible Type II Brugada ECG pattern. No family history of SCD, no syncope, no tachypalpitations.  Marland Kitchen COPD (chronic obstructive pulmonary disease) ([place])     Prior heavy smoker. PFTs (3/11): FVC 87%, FEV1 73%, ratio 0.57, DLCO 75%, TLC 121%. Moderate obstructive defect. PFTs (9/15): Only minimal obstruction, sugPrior heavy smoker. PFTs (3/11): FVC 87%, FEV1 73%, ratio 0.57, DLCO 75%, TLC 121%. Moderate obstructive defect. PFTs (9/15) minimal obstruction, poss asthma component   . GERD (gastroesophageal reflux disease)   . Depression     with bipolar tendencies  . Urinary retention with incomplete bladder emptying     receives botox injections and treatment for BPH through [facility]  . BPH (benign prostatic hyperplasia)   . CAD (coronary artery  disease)     a. LHC 1/15 - mid LCx 50 and 60%, proximal RCA 50%  . HLD (hyperlipidemia)   . HTN (hypertension)   . Chronic diastolic CHF (congestive heart failure) ([place])   . LVH (left ventricular hypertrophy)     a. Echo 12/14 Inferior and distal septal HK, mild LVH, EF 50-55%, mild MR, mild LAE  . Bladder cancer (HCC)     [facility], [facility] low grade papillary urothileal carcinoma  . DOE (dyspnea on exertion)     a. Myoview 8/09- EF 57%, no ischemia // b. Myoview (3/11) EF 68%, diaphragmatic attenuation, no ischemia //  c Echo (4/11) EF 0000000, mild diastolic dysfunction, PASP 38 mmHg // d. PFTs 9/15 minimal obstruction  /  e. CT negative for ILD  . Mitral regurgitation     Echo (4/11) with PISA ERO 0.3 cm^2 and regurgitant volume 41 mL (moderate MR). Echo (12/14) with mild MR.   Marland Kitchen History of Doppler ultrasound     Carotid US 3/11 negative for significant stenosis  . Mild dementia   . Low testosterone   . CKD (chronic kidney disease)   . Lung nodule     a. CT in 2015 >> PET in 12/15 not sugg of malignancy   . Thoracic aortic aneurysm (HCC)     4.1 cm 2017   Past Surgical History:  Past Surgical History  Procedure Laterality Date  . Total knee arthroplasty Right   . Transurethral resection of prostate    . Tonsillectomy    . Appendectomy    . Rotator cuff repair    .  Hemorrhoid surgery    . Left heart catheterization with coronary angiogram N/A 01/05/2013    Procedure: LEFT HEART CATHETERIZATION WITH CORONARY ANGIOGRAM;  Surgeon: Larey Dresser, MD;  Location: [name] CATH LAB;  Service: Cardiovascular;  Laterality: N/A;   HPI:  [name] [name] is a 75 y.o. male with medical history significant for multiple sclerosis, chronic diastolic heart failure on Lasix, hypertension, CAD, COPD, dyslipidemia, BPH, depression and GERD. Patient presented to the hospital today after developing slurred speech and mild confusion as well as mild gait ataxia since this past Friday. Evaluation in the ER was  consistent with right MCA stroke on MRI. Formal neurological evaluation pending. Patient presented greater than 8 hours after symptoms began secure is not a candidate for thrombolytic therapy.  MRI showing areas of acute infarction in the region of the insula and posterior frontal lobe on the right consistent with right MCA branch vessel occlusion. No swelling or hemorrhage.   Assessment / Plan / Recommendation Clinical Impression  Cognitve/linguistic evaluation was completed using the MOCA-B.  Per the patient and his wife he was completely independent prior to admission.  He had just returned from a trip to [place] two weeks ago.  He achieved a score of 18/30 on the exam indicating moderate cognitive deficits.  Deficits were noted in executive function, immediate and delayed recall, word fluency, calculations, visuoperceptual skills, and attention.  The patient required significant repetition in attempt to complete some tasks especially calculations.  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  At times, a direction would be given and the patient would attempt to start the task and would then ask "What are we doing?"  He kept stating he just couldn't concentrate.  His is aware that he is having issues and can even discuss the possible issues this may cause him to be safe.  However, he can be impulsive.  For example, even after stating he was not allowed to get up alone because he was a fall risk he attempted to jump up from the chair to go use the bathroom without assistance.  The patient is also having visual issues.  He required moderate cues to be sure to always look to the left side of the page when working with pen and paper.  In the patient's current state he would be a safety risk if left alone and would benefit from intensive ST to address his cognitive deficits.        SLP Assessment  Patient needs continued Speech Lanaguage Pathology Services    Follow Up Recommendations   Inpatient Rehab    Frequency and Duration min 2x/week  2 weeks      SLP Evaluation Prior Functioning  Cognitive/Linguistic Baseline: Within functional limits Type of Home: House  Lives With: Spouse Available Help at Discharge: Family;Available 24 hours/day Vocation: Retired (Pt was a Music therapist prior to retirement.  )   Cognition  Overall Cognitive Status: Impaired/Different from baseline Arousal/Alertness: Awake/alert Orientation Level: Oriented X4 Attention: Sustained Sustained Attention: Impaired Sustained Attention Impairment: Verbal basic;Verbal complex;Functional basic;Functional complex Memory: Impaired Memory Impairment: Storage deficit;Decreased recall of new information;Decreased short term memory Decreased Short Term Memory: Verbal basic;Verbal complex;Functional basic;Functional complex Awareness: Appears intact Problem Solving: Appears intact Executive Function: Organizing Organizing: Impaired Organizing Impairment: Verbal basic;Verbal complex;Functional basic;Functional complex Safety/Judgment: Impaired Comments: The patient is aware of deficits but in the moment will do things that he has just told you he's not safe to do.  Comprehension  Auditory Comprehension Overall Auditory Comprehension: Appears within functional limits for tasks assessed Commands: Within Functional Limits Conversation: Complex Reading Comprehension Reading Status: Within funtional limits (Given cues to look at entire page.)    Expression Expression Primary Mode of Expression: Verbal Verbal Expression Overall Verbal Expression: Appears within functional limits for tasks assessed Initiation: No impairment Automatic Speech: Name;Social Response Level of Generative/Spontaneous Verbalization: Conversation Repetition: No impairment Naming: No impairment Pragmatics: No impairment Non-Verbal Means of Communication: Not applicable Written Expression Dominant Hand: Right Written  Expression: Within Functional Limits   Oral / Motor  Oral Motor/Sensory Function Overall Oral Motor/Sensory Function: Within functional limits Motor Speech Overall Motor Speech: Appears within functional limits for tasks assessed Respiration: Within functional limits Phonation: Normal Resonance: Within functional limits Articulation: Within functional limitis Intelligibility: Intelligible Motor Planning: Witnin functional limits Motor Speech Errors: Not applicable   GO          Functional Assessment Tool Used: ASHA NOMS and clinical judgment.   Functional Limitations: Memory Memory Current Status [phone]): At least 20 percent but less than 40 percent impaired, limited or restricted Memory Goal Status [id number]): At least 20 percent but less than 40 percent impaired, limited or restricted         Shelly Flatten, MA, [place] Lamar Sprinkles 06/16/2015, 2:07 PM

## 2015-06-16 NOTE — Evaluation (Addendum)
Physical Therapy Evaluation Patient Details Name: [name] [name] MRN: [id number] DOB: Feb 13, 1940 Today's Date: 06/16/2015   History of Present Illness  [name] [name] is a 75 y.o. male with medical history significant for multiple sclerosis, chronic diastolic heart failure on Lasix, hypertension, CAD, COPD, dyslipidemia, BPH, depression and GERD. Patient presented to the hospital today after developing slurred speech and mild confusion as well as mild gait ataxia since this past Friday. Evaluation in the ER was consistent with right MCA stroke on MRI.  Clinical Impression  Pt admitted with above. Pt with noted further impaired vision and cognitive deficits affecting balance requiring assist for ambulation. Due to vision deficits pt with noted impaired balance at demo'd by score of 16 on DGI. Pt to benefit from CIR to address balance deficits, higher executive functioning, and memory deficits. Pt indep PTA. Pt with supportive wife and good home set up.    Follow Up Recommendations CIR;Supervision/Assistance - 24 hour    Equipment Recommendations  None recommended by PT    Recommendations for Other Services       Precautions / Restrictions Precautions Precautions: Other (comment) Precaution Comments: vision deficits on L Restrictions Weight Bearing Restrictions: No      Mobility  Bed Mobility               General bed mobility comments: pt standing at sink with OT upon PT arrival  Transfers Overall transfer level: Needs assistance Equipment used: None Transfers: Sit to/from Stand Sit to Stand: Supervision         General transfer comment: supervision for safety  Ambulation/Gait Ambulation/Gait assistance: min guard Ambulation Distance (Feet): 200 Feet Assistive device: None Gait Pattern/deviations: Step-through pattern, decreased step length Gait velocity: slow   General Gait Details: pt mildly guarded with decreased step length most likely due to impaired  vision that he was unaware  Stairs            Wheelchair Mobility    Modified Rankin (Stroke Patients Only) Modified Rankin (Stroke Patients Only) Pre-Morbid Rankin Score: No symptoms Modified Rankin: Slight disability     Balance Overall balance assessment: Needs assistance         Standing balance support: During functional activity Standing balance-Leahy Scale: Fair Standing balance comment: pt able to stand at sink and perform adls,with OT with supervision                 Standardized Balance Assessment Standardized Balance Assessment : Dynamic Gait Index   Dynamic Gait Index Level Surface: Mild Impairment Change in Gait Speed: Mild Impairment Gait with Horizontal Head Turns: Mild Impairment Gait with Vertical Head Turns: Mild Impairment Gait and Pivot Turn: Mild Impairment Step Over Obstacle: Mild Impairment Step Around Obstacles: Mild Impairment Steps: Mild Impairment Total Score: 16       Pertinent Vitals/Pain Pain Assessment: No/denies pain    Home Living Family/patient expects to be discharged to:: Private residence Living Arrangements: Spouse/significant other Available Help at Discharge: Family;Available 24 hours/day Type of Home: House Home Access: Level entry     Home Layout: One level Home Equipment: Grab bars - tub/shower;Crutches;Walker - 2 wheels      Prior Function Level of Independence: Independent         Comments: was just on a trip to [place] recently     Hand Dominance   Dominant Hand: Right    Extremity/Trunk Assessment   Upper Extremity Assessment: Overall WFL for tasks assessed  Lower Extremity Assessment: Overall WFL for tasks assessed      Cervical / Trunk Assessment: Normal  Communication   Communication: No difficulties  Cognition Arousal/Alertness: Awake/alert Behavior During Therapy: WFL for tasks assessed/performed Overall Cognitive Status: Impaired/Different from baseline Area of  Impairment: Safety/judgement     Memory: Decreased short-term memory   Safety/Judgement: Decreased awareness of deficits (unaware of vision deficits on L)          General Comments      Exercises        Assessment/Plan    PT Assessment Patient needs continued PT services  PT Diagnosis Difficulty walking;Generalized weakness   PT Problem List Decreased strength;Decreased range of motion;Decreased activity tolerance;Decreased balance;Decreased mobility;Decreased coordination;Decreased cognition  PT Treatment Interventions DME instruction;Gait training;Stair training;Therapeutic activities;Functional mobility training;Therapeutic exercise;Balance training   PT Goals (Current goals can be found in the Care Plan section) Acute Rehab PT Goals Patient Stated Goal: home PT Goal Formulation: With patient/family Time For Goal Achievement: 06/23/15 Potential to Achieve Goals: Good Additional Goals Additional Goal #1: Pt to score >41 Berg Balance Test to indicate minimal falls risk.    Frequency Min 4X/week   Barriers to discharge        Co-evaluation               End of Session Equipment Utilized During Treatment: Gait belt Activity Tolerance: Patient tolerated treatment well Patient left: in chair;with call bell/phone within reach;with chair alarm set;with family/visitor present Nurse Communication: Mobility status         Time: [id number] PT Time Calculation (min) (ACUTE ONLY): 23 min   Charges:   PT Evaluation $PT Eval Moderate Complexity: 1 Procedure PT Treatments $Gait Training: 8-22 mins   PT G CodesKingsley Callander 06/16/2015, 10:53 AM   Kittie Plater, PT, DPT Pager #: [phone] Office #: [phone]

## 2015-06-16 NOTE — Progress Notes (Signed)
STROKE TEAM PROGRESS NOTE   HISTORY OF PRESENT ILLNESS (per record) [name] [name] is a 75 y.o. male with a history of multiple sclerosis who is not on any treatmentt. He has been diagnosed with primary progressive MS and therefore is not on any disease modifying agents. For the past few days, his wife has noticed slurred speech, repetitive speech. He is brought in for evaluation of this today 06/15/2015 and an MRI was performed which shows an acute infarct in the nondominant hemisphere, MCA distribution. He states that the symptoms have been fairly persistent, not getting much better or worse. He denies any new arm or leg weakness or numbness. He was Last known well several days ago. Patient was not administered IV t-PA secondary to delay in arrival . He was admitted for further evaluation and treatment.   SUBJECTIVE (INTERVAL HISTORY) Patient remains neurologically stable. He is had no new symptoms. MRI scan confirms a right insular and subfrontal MCA branch embolic infarct. Transthoracic echo, carotid ultrasound are unremarkable. LDL cholesterol is elevated at 91 and hemoglobin A1c is borderline. I discussed all these results with the patient.   OBJECTIVE Temp:  [98.3 F (36.8 C)-98.7 F (37.1 C)] 98.3 F (36.8 C) (06/12 0625) Pulse Rate:  [63-80] 77 (06/12 0625) Cardiac Rhythm:  [-] Normal sinus rhythm (06/11 1930) Resp:  [10-16] 16 (06/12 0625) BP: (119-182)/(71-107) 165/107 mmHg (06/12 0625) SpO2:  [82 %-99 %] 99 % (06/12 0625) Weight:  [99.791 kg (220 lb)] 99.791 kg (220 lb) (06/11 1530)  CBC:  Recent Labs Lab 06/15/15 1010  WBC 9.3  NEUTROABS 5.2  HGB 14.3  HCT 45.4  MCV 94.8  PLT [id number]    Basic Metabolic Panel:  Recent Labs Lab 06/15/15 1010  NA 137  K 4.6  CL 106  CO2 21*  GLUCOSE 119*  BUN 25*  CREATININE 1.20  CALCIUM 9.1    Lipid Panel:    Component Value Date/Time   CHOL 185 06/16/2015 0604   TRIG 256* 06/16/2015 0604   HDL 43 06/16/2015 0604    CHOLHDL 4.3 06/16/2015 0604   VLDL 51* 06/16/2015 0604   LDLCALC 91 06/16/2015 0604   HgbA1c:  Lab Results  Component Value Date   HGBA1C 6.4* 03/31/2015   Urine Drug Screen:    Component Value Date/Time   LABOPIA POSITIVE* 06/15/2015 1907   COCAINSCRNUR NONE DETECTED 06/15/2015 1907   LABBENZ NONE DETECTED 06/15/2015 1907   AMPHETMU NONE DETECTED 06/15/2015 1907   THCU NONE DETECTED 06/15/2015 1907   LABBARB NONE DETECTED 06/15/2015 1907      IMAGING  Ct Head Wo Contrast  06/15/2015  CLINICAL DATA:  Slurred speech and confusion since Friday. History of dementia and multiple sclerosis. EXAM: CT HEAD WITHOUT CONTRAST TECHNIQUE: Contiguous axial images were obtained from the base of the skull through the vertex without intravenous contrast. COMPARISON:  08/12/2009 MRI FINDINGS: Sinuses/Soft tissues: Clear paranasal sinuses and mastoid air cells. Intracranial: Moderate periventricular white matter hypoattenuation is grossly similar to the prior MRI, given cross modality comparison. Mild cerebral and cerebellar atrophy. No mass lesion, hemorrhage, hydrocephalus, acute infarct, intra-axial, or extra-axial fluid collection. IMPRESSION: 1.  No acute intracranial abnormality. 2. Periventricular white matter hypoattenuation may relate to the clinical history of multiple sclerosis and/or chronic small vessel ischemic change. 3. Cerebral and cerebellar atrophy. Electronically Signed   By: Abigail Miyamoto M.D.   On: 06/15/2015 11:03   Mr Jeri Cos [id number] Contrast  06/15/2015  CLINICAL DATA:  Acute mental status changes.  Headache. Numbness and tingling. History of multiple sclerosis. EXAM: MRI HEAD WITHOUT AND WITH CONTRAST TECHNIQUE: Multiplanar, multiecho pulse sequences of the brain and surrounding structures were obtained without and with intravenous contrast. CONTRAST:  24mL MULTIHANCE GADOBENATE DIMEGLUMINE 529 MG/ML IV SOLN COMPARISON:  CT same day.  MRI 08/12/2009. FINDINGS: Diffusion imaging shows  scattered foci of restricted diffusion in the insular and posterior frontal region consistent with right MCA branch vessel territory infarction. No other vascular territory acute infarction. No swelling or hemorrhage. The brainstem is normal. There are old small vessel cerebellar infarctions. Cerebral hemispheres show extensive chronic small vessel ischemic changes throughout the deep white matter. Given the history of multiple sclerosis, some of these white matter lesions could relate to chronic demyelination. No mass lesion, hemorrhage, hydrocephalus or extra-axial collection. No pituitary mass. No inflammatory sinus disease. No skull or skullbase lesion. Major vessels at the base of the brain show flow. After contrast administration, no abnormal enhancement occurs. IMPRESSION: Areas of acute infarction in the region of the insula and posterior frontal lobe on the right consistent with right MCA branch vessel occlusion. No swelling or hemorrhage. Extensive chronic small vessel ischemic changes elsewhere throughout the brain, progressive since the previous exam. Given the history of multiple sclerosis clinically, some of these white matter lesions could relate to chronic demyelination. Electronically Signed   By: Nelson Chimes M.D.   On: 06/15/2015 13:36   2D Echocardiogram  - Left ventricle: The cavity size was normal. There was mild focal basal hypertrophy of the septum. Systolic function was normal. The estimated ejection fraction was in the range of 60% to 65%. Wall motion was normal; there were no regional wall motion abnormalities. Doppler parameters are consistent with abnormal left ventricular relaxation (grade 1 diastolic dysfunction). - Aortic valve: There was mild regurgitation. Impressions:   - No cardiac source of emboli was indentified.  Carotid Doppler   There is 1-39% bilateral ICA stenosis. Vertebral artery flow is antegrade.     PHYSICAL EXAM Pleasant elderly gentleman not in distress.  . Afebrile. Head is nontraumatic. Neck is supple without bruit.    Cardiac exam no murmur or gallop. Lungs are clear to auscultation. Distal pulses are well felt. Neurological Exam :  Awake alert oriented x 3 normal speech and language. Mild left lower face asymmetry. Tongue midline. No drift. Mild diminished fine finger movements on left. Orbits right over left upper extremity. Mild left grip weak.. Normal sensation . Normal coordination. ASSESSMENT/PLAN Mr. GRAYSYN FREEDLAND is a 75 y.o. male with history of primary progressive MS, diastolic CHF and GERD presenting with slurred speech. He did not receive IV t-PA due to delay in arrival.   Stroke:  right MCA infarcts in R insula and posterior frontal lobe, embolic secondary unknown source  Resultant  Slurred speech,  and mild left-sided weakness  MRI  R MCA branch vessel occlusion w/ infarcts R insula and posterior frontal love  MRA    (not ordered)  Carotid Doppler  No significant stenosis   2D Echo  EF 60-65%. No source of embolus   LDL 91  HgbA1c 6.4 in March  Lovenox 40 mg sq daily for VTE prophylaxis  Diet NPO time specified  aspirin 81 mg daily prior to admission, now on aspirin 300 mg suppository daily  Patient counseled to be compliant with his antithrombotic medications  Ongoing aggressive stroke risk factor management  Therapy recommendations:  OP PT  Disposition:  pending   Hypertension  Stable  Permissive hypertension (  OK if < 220/120) but gradually normalize in 5-7 days  Long-term BP goal normotensive  Hyperlipidemia  Home meds:  lipitor 20, resumed in hospital  LDL 91, goal < 70  Continue statin at discharge  Other Stroke Risk Factors  Advanced age  Former Cigarette smoker  ETOH use, advised to drink no more than 2 drinks a day  Overweight, Body mass index is 29.83 kg/(m^2)., recommend weight loss, diet and exercise as appropriate   Family hx stroke (mother and father)  Coronary artery  disease  Chronic diastolic CHF  Other Active Problems  Mild dementia  COPD  Depression  BPH  GERD    Hospital day # 1 I have personally examined this patient, reviewed notes, independently viewed imaging studies, participated in medical decision making and plan of care. I have made any additions or clarifications directly to the above note.  He presented with embolic right MCA branch infarct and remains at risk for neurological worsening, recurrent stroke and TIA. He needs ongoing evaluation with transesophageal echocardiogram and loop recorder which we are going to arrange. Also will order CT angiogram of the brain I spent a lot of time explaining the procedures for the patient and the need for the requested testing. Greater than 50% time during this 35 minute visit was spent on counseling and coordination of care about stroke risk and prevention. Antony Contras, MD Medical Director [facility] Pager: [phone] 06/16/2015 5:54 PM     To contact Stroke Continuity provider, please refer to [email or web address] After hours, contact General Neurology

## 2015-06-16 NOTE — Evaluation (Signed)
Occupational Therapy Evaluation Patient Details Name: [name] [name] MRN: [id number] DOB: [birth year] Today's Date: 06/16/2015    History of Present Illness [name] [name] is a 75 y.o. male with medical history significant for multiple sclerosis, chronic diastolic heart failure on Lasix, hypertension, CAD, COPD, dyslipidemia, BPH, depression and GERD. Patient presented to the hospital today after developing slurred speech and mild confusion as well as mild gait ataxia since this past Friday. Evaluation in the ER was consistent with right MCA stroke on MRI.   Clinical Impression   PT admitted with visual deficits s/p CVA. Pt currently with functional limitiations due to the deficits listed below (see OT problem list). PTA was completely independent and just returned from trip to [place]. Pt will benefit from skilled OT to increase their independence and safety with adls and balance to allow discharge CIR.     Follow Up Recommendations  CIR;Supervision/Assistance - 24 hour    Equipment Recommendations  None recommended by OT    Recommendations for Other Services Rehab consult     Precautions / Restrictions Precautions Precautions: Other (comment) Precaution Comments: vision deficits on L Restrictions Weight Bearing Restrictions: No      Mobility Bed Mobility Overal bed mobility: Modified Independent                Transfers Overall transfer level: Needs assistance Equipment used: None Transfers: Sit to/from Stand Sit to Stand: Min guard              Balance Overall balance assessment: Needs assistance           Standing balance-Leahy Scale: Fair                              ADL Overall ADL's : Needs assistance/impaired     Grooming: Oral care;Minimal assistance Grooming Details (indicate cue type and reason): sequence and to recall NPO             Lower Body Dressing: Minimal assistance Lower Body Dressing Details (indicate  cue type and reason): Pt with shoes placed on L side adn could not locate them. Once max cues provided able to don shoes Toilet Transfer: Minimal assistance           Functional mobility during ADLs: Minimal assistance General ADL Comments: pt walking into wall on L side. pt with decr awareness to L visual field deficits. pt decr safety awareness. pt repeating questions. pt walking thearpist to the bathroom for him to void and no awarenes to foley     Vision Vision Assessment?: Yes Eye Alignment: Within Functional Limits Ocular Range of Motion: Within Functional Limits Tracking/Visual Pursuits: Requires cues, head turns, or add eye shifts to track Visual Fields: Left visual field deficit Additional Comments: Pt provided clock and pt with L side of clock more spaced out and increased time to write in L side. pt drawing 3 hands on the clock and unaware due to inability to see all lines at the same time. pt labeling circles ( 10 total) 1-10 as he sees them. pt writes in R sdie and works toward teh L . pt describes himself as a READER. pt asked to draw a line through the middle of various lines on a page and pt consistently favored R side of the page. pt reports " i noticed somethign about my R eye this morning. " wife reports L deficits . pt unable to locate remote on L side  with max cues   Perception     Praxis      Pertinent Vitals/Pain Pain Assessment: No/denies pain     Hand Dominance Right   Extremity/Trunk Assessment Upper Extremity Assessment Upper Extremity Assessment: Overall WFL for tasks assessed   Lower Extremity Assessment Lower Extremity Assessment: Defer to PT evaluation   Cervical / Trunk Assessment Cervical / Trunk Assessment: Normal   Communication Communication Communication: No difficulties   Cognition Arousal/Alertness: Awake/alert Behavior During Therapy: WFL for tasks assessed/performed Overall Cognitive Status: Impaired/Different from baseline Area of  Impairment: Safety/judgement     Memory: Decreased short-term memory   Safety/Judgement: Decreased awareness of deficits (unaware of vision deficits on L)         General Comments       Exercises       Shoulder Instructions      Home Living Family/patient expects to be discharged to:: Private residence Living Arrangements: Spouse/significant other Available Help at Discharge: Family;Available 24 hours/day Type of Home: House Home Access: Level entry     Home Layout: One level     Bathroom Shower/Tub: Occupational psychologist: Standard     Home Equipment: Grab bars - tub/shower;Crutches;Walker - 2 wheels      Lives With: Spouse    Prior Functioning/Environment Level of Independence: Independent        Comments: was just on a trip to [place] recently    OT Diagnosis: Generalized weakness;Acute pain   OT Problem List: Decreased strength;Decreased activity tolerance;Impaired balance (sitting and/or standing);Decreased cognition;Decreased safety awareness;Decreased knowledge of use of DME or AE;Decreased knowledge of precautions   OT Treatment/Interventions: Self-care/ADL training;Cognitive remediation/compensation;Visual/perceptual remediation/compensation;Patient/family education;Balance training;Therapeutic activities    OT Goals(Current goals can be found in the care plan section) Acute Rehab OT Goals Patient Stated Goal: home OT Goal Formulation: With patient Time For Goal Achievement: 06/30/15 Potential to Achieve Goals: Good  OT Frequency: Min 2X/week   Barriers to D/C:            Co-evaluation              End of Session Equipment Utilized During Treatment: Gait belt Nurse Communication: Mobility status;Precautions  Activity Tolerance: Patient tolerated treatment well Patient left: in chair;with call bell/phone within reach;with chair alarm set;with family/visitor present   Time: [phone] (education to wife 10 minutes) OT Time  Calculation (min): 35 min Charges:  OT General Charges $OT Visit: 1 Procedure OT Evaluation $OT Eval Moderate Complexity: 1 Procedure OT Treatments $Self Care/Home Management : 8-22 mins $Therapeutic Activity: 8-22 mins G-Codes:    Parke Poisson B 2015/07/02, 4:27 PM  Jeri Modena   OTR/L [phone] Office: [phone] .

## 2015-06-16 NOTE — Progress Notes (Signed)
PROGRESS NOTE    [name] [name]  [id number] DOB: [birth year] DOA: 06/15/2015 PCP: [name], MD (Confirm with patient/family/NH records and if not entered, this HAS to be entered at [facility] point of entry. "No PCP" if truly none.)   Brief Narrative: (Start on day 1 of progress note - keep it brief and live) Acute right MCA cva, 75 yo male with copd, depression, htn, ckd,. Clinically improving, neuro workup in progress.  Assessment & Plan:   Principal Problem:   Acute right MCA stroke (HCC) Active Problems:   Essential hypertension   Depression   BPH (benign prostatic hyperplasia)   Chronic diastolic CHF (congestive heart failure) (HCC)   CAD (coronary artery disease)   COPD (chronic obstructive pulmonary disease) (HCC)   Hyperlipidemia   Gastroesophageal reflux disease without esophagitis   1. Cardiovascular. Patient euvolemic, blood pressure at [id number] to 0000000 systolic, telemetry sinus rhythm, will continue bid furosemide and bisprolol.  2, Pulmonary. No signs of aspiration, will continue to monitor oxymetry and supplemental 02 per [name] to target 02 sat above 92%.  3. Nephrology. Renal function with cr at 1./20 with K at 4.6, will follow renal panel in am,. Avoid hypotension, continue furosemide for now.  4. Neurology. Patient awake and alert, no confusion or slurred speech, will continue neuro checks, antiplatelet therapy and atorvastatin, for tee in am. Will continue ropinarole, venlafaxine, ariprazole.   DVT prophylaxis: (Lovenox/Heparin/SCD's/anticoagulated/None (if comfort care) Code Status: (Full/Partial - specify details) Family Communication: (Specify name, relationship & date discussed. NO "discussed with patient") Disposition Plan: (specify when and where you expect patient to be discharged). Include barriers to DC in this tab.   Consultants:   neurology  Procedures: (Don't include imaging studies which can be auto populated. Include things that cannot be  auto populated i.e. Echo, Carotid and venous dopplers, Foley, Bipap, HD, tubes/drains, wound vac, central lines etc)    Antimicrobials: (specify start and planned stop date. Auto populated tables are space occupying and do not give end dates)     Subjective: Patient awake and alert, no nausea or vomiting, no chest pain or shotness of breath.  Objective: Filed Vitals:   06/16/15 0330 06/16/15 0625 06/16/15 0834 06/16/15 1400  BP: 179/87 165/107  158/78  Pulse: 72 77  78  Temp: 98.7 F (37.1 C) 98.3 F (36.8 C)  98.7 F (37.1 C)  TempSrc: Oral Oral  Oral  Resp: 12 16  18   Height:      Weight:      SpO2: 82% 99% 99% 100%    Intake/Output Summary (Last 24 hours) at 06/16/15 1750 Last data filed at 06/16/15 0622  Gross per 24 hour  Intake      0 ml  Output    825 ml  Net   -825 ml   Filed Weights   06/15/15 1530  Weight: 99.791 kg (220 lb)    Examination:  General exam: not in pain or dyspnea. E ENT: no conjunctival pallor or icterus, oral mucosa moist. Respiratory system: Clear to auscultation. Respiratory effort normal. No wheezing, rales or rhonchi. Cardiovascular system: S1 & S2 heard, RRR. No JVD, murmurs, rubs, gallops or clicks. No pedal edema. Gastrointestinal system: Abdomen is nondistended, soft and nontender. No organomegaly or masses felt. Normal bowel sounds heard. Central nervous system: awake and alert, cn 2-12 grossly intact. No evident focality. Extremities: Symmetric 5 x 5 power. Skin: No rashes, lesions or ulcers .     Data Reviewed: I have personally reviewed following  labs and imaging studies  CBC:  Recent Labs Lab 06/15/15 1010  WBC 9.3  NEUTROABS 5.2  HGB 14.3  HCT 45.4  MCV 94.8  PLT [id number]   Basic Metabolic Panel:  Recent Labs Lab 06/15/15 1010  NA 137  K 4.6  CL 106  CO2 21*  GLUCOSE 119*  BUN 25*  CREATININE 1.20  CALCIUM 9.1   GFR: Estimated Creatinine Clearance: 67.1 mL/min (by C-G formula based on Cr of  1.2). Liver Function Tests:  Recent Labs Lab 06/15/15 1010  AST 24  ALT 23  ALKPHOS 72  BILITOT 0.6  PROT 7.2  ALBUMIN 4.0   No results for input(s): LIPASE, AMYLASE in the last 168 hours.  Recent Labs Lab 06/15/15 1037  AMMONIA 38*   Coagulation Profile:  Recent Labs Lab 06/15/15 1010  INR 0.95   Cardiac Enzymes: No results for input(s): CKTOTAL, CKMB, CKMBINDEX, TROPONINI in the last 168 hours. BNP (last 3 results) No results for input(s): PROBNP in the last 8760 hours. HbA1C: No results for input(s): HGBA1C in the last 72 hours. CBG:  Recent Labs Lab 06/15/15 0956  GLUCAP 123*   Lipid Profile:  Recent Labs  06/16/15 0604  CHOL 185  HDL 43  LDLCALC 91  TRIG 256*  CHOLHDL 4.3   Thyroid Function Tests: No results for input(s): TSH, T4TOTAL, FREET4, T3FREE, THYROIDAB in the last 72 hours. Anemia Panel: No results for input(s): VITAMINB12, FOLATE, FERRITIN, TIBC, IRON, RETICCTPCT in the last 72 hours. Sepsis Labs: No results for input(s): PROCALCITON, LATICACIDVEN in the last 168 hours.  No results found for this or any previous visit (from the past 240 hour(s)).       Radiology Studies: Ct Head Wo Contrast  06/15/2015  CLINICAL DATA:  Slurred speech and confusion since Friday. History of dementia and multiple sclerosis. EXAM: CT HEAD WITHOUT CONTRAST TECHNIQUE: Contiguous axial images were obtained from the base of the skull through the vertex without intravenous contrast. COMPARISON:  08/12/2009 MRI FINDINGS: Sinuses/Soft tissues: Clear paranasal sinuses and mastoid air cells. Intracranial: Moderate periventricular white matter hypoattenuation is grossly similar to the prior MRI, given cross modality comparison. Mild cerebral and cerebellar atrophy. No mass lesion, hemorrhage, hydrocephalus, acute infarct, intra-axial, or extra-axial fluid collection. IMPRESSION: 1.  No acute intracranial abnormality. 2. Periventricular white matter hypoattenuation  may relate to the clinical history of multiple sclerosis and/or chronic small vessel ischemic change. 3. Cerebral and cerebellar atrophy. Electronically Signed   By: Abigail Miyamoto M.D.   On: 06/15/2015 11:03   Mr Jeri Cos [id number] Contrast  06/15/2015  CLINICAL DATA:  Acute mental status changes. Headache. Numbness and tingling. History of multiple sclerosis. EXAM: MRI HEAD WITHOUT AND WITH CONTRAST TECHNIQUE: Multiplanar, multiecho pulse sequences of the brain and surrounding structures were obtained without and with intravenous contrast. CONTRAST:  90mL MULTIHANCE GADOBENATE DIMEGLUMINE 529 MG/ML IV SOLN COMPARISON:  CT same day.  MRI 08/12/2009. FINDINGS: Diffusion imaging shows scattered foci of restricted diffusion in the insular and posterior frontal region consistent with right MCA branch vessel territory infarction. No other vascular territory acute infarction. No swelling or hemorrhage. The brainstem is normal. There are old small vessel cerebellar infarctions. Cerebral hemispheres show extensive chronic small vessel ischemic changes throughout the deep white matter. Given the history of multiple sclerosis, some of these white matter lesions could relate to chronic demyelination. No mass lesion, hemorrhage, hydrocephalus or extra-axial collection. No pituitary mass. No inflammatory sinus disease. No skull or skullbase lesion. Major vessels at  the base of the brain show flow. After contrast administration, no abnormal enhancement occurs. IMPRESSION: Areas of acute infarction in the region of the insula and posterior frontal lobe on the right consistent with right MCA branch vessel occlusion. No swelling or hemorrhage. Extensive chronic small vessel ischemic changes elsewhere throughout the brain, progressive since the previous exam. Given the history of multiple sclerosis clinically, some of these white matter lesions could relate to chronic demyelination. Electronically Signed   By: Nelson Chimes M.D.   On:  06/15/2015 13:36        Scheduled Meds: . ARIPiprazole  5 mg Oral Daily  . aspirin  300 mg Rectal Daily   Or  . aspirin  325 mg Oral Daily  . atorvastatin  20 mg Oral q1800  . bisoprolol  2.5 mg Oral Daily  . enoxaparin (LOVENOX) injection  40 mg Subcutaneous Q24H  . furosemide  40 mg Oral BID  . oxybutynin  5 mg Oral QHS  . pantoprazole  40 mg Oral Daily  . rOPINIRole  1 mg Oral QHS  . tiotropium  18 mcg Inhalation Daily  . venlafaxine XR  150 mg Oral BID  . vitamin B-12  1,000 mcg Oral Daily   Continuous Infusions:    LOS: 1 day        [name] Cino Gerome Apley, MD [facility] Pager [phone]  If 7PM-7AM, please contact night-coverage [email or web address] Password [email or web address] 06/16/2015, 5:50 PM

## 2015-06-17 DIAGNOSIS — G35 Multiple sclerosis: Secondary | ICD-10-CM

## 2015-06-17 DIAGNOSIS — I63511 Cerebral infarction due to unspecified occlusion or stenosis of right middle cerebral artery: Secondary | ICD-10-CM

## 2015-06-17 DIAGNOSIS — I5032 Chronic diastolic (congestive) heart failure: Secondary | ICD-10-CM

## 2015-06-17 DIAGNOSIS — N400 Enlarged prostate without lower urinary tract symptoms: Secondary | ICD-10-CM

## 2015-06-17 DIAGNOSIS — I10 Essential (primary) hypertension: Secondary | ICD-10-CM

## 2015-06-17 DIAGNOSIS — J439 Emphysema, unspecified: Secondary | ICD-10-CM

## 2015-06-17 DIAGNOSIS — I2510 Atherosclerotic heart disease of native coronary artery without angina pectoris: Secondary | ICD-10-CM

## 2015-06-17 LAB — BASIC METABOLIC PANEL
Anion gap: 9 (ref 5–15)
BUN: 19 mg/dL (ref 6–20)
CHLORIDE: 101 mmol/L (ref 101–111)
CO2: 25 mmol/L (ref 22–32)
Calcium: 9.3 mg/dL (ref 8.9–10.3)
Creatinine, Ser: 1.06 mg/dL (ref 0.61–1.24)
Glucose, Bld: 103 mg/dL — ABNORMAL HIGH (ref 65–99)
POTASSIUM: 3.9 mmol/L (ref 3.5–5.1)
SODIUM: 135 mmol/L (ref 135–145)

## 2015-06-17 LAB — HEMOGLOBIN A1C
Hgb A1c MFr Bld: 6.4 % — ABNORMAL HIGH (ref 4.8–5.6)
MEAN PLASMA GLUCOSE: 137 mg/dL

## 2015-06-17 NOTE — Progress Notes (Signed)
STROKE TEAM PROGRESS NOTE   HISTORY OF PRESENT ILLNESS (per record) [name] [name] is a 75 y.o. male with a history of multiple sclerosis who is not on any treatmentt. He has been diagnosed with primary progressive MS and therefore is not on any disease modifying agents. For the past few days, his wife has noticed slurred speech, repetitive speech. He is brought in for evaluation of this today 06/15/2015 and an MRI was performed which shows an acute infarct in the nondominant hemisphere, MCA distribution. He states that the symptoms have been fairly persistent, not getting much better or worse. He denies any new arm or leg weakness or numbness. He was Last known well several days ago. Patient was not administered IV t-PA secondary to delay in arrival . He was admitted for further evaluation and treatment.   SUBJECTIVE (INTERVAL HISTORY) Patient remains neurologically stable. He is had no new symptoms. TEE pending. No new complaints.CTA brain no large vessel stenosis.   OBJECTIVE Temp:  [97.8 F (36.6 C)-98.7 F (37.1 C)] 97.8 F (36.6 C) (06/13 1710) Pulse Rate:  [75-98] 75 (06/13 1710) Cardiac Rhythm:  [-] Normal sinus rhythm (06/13 0700) Resp:  [18-20] 20 (06/13 1710) BP: (110-158)/(67-78) 146/67 mmHg (06/13 1710) SpO2:  [96 %-100 %] 99 % (06/13 1710)  CBC:   Recent Labs Lab 06/15/15 1010  WBC 9.3  NEUTROABS 5.2  HGB 14.3  HCT 45.4  MCV 94.8  PLT [id number]    Basic Metabolic Panel:   Recent Labs Lab 06/15/15 1010 06/17/15 0347  NA 137 135  K 4.6 3.9  CL 106 101  CO2 21* 25  GLUCOSE 119* 103*  BUN 25* 19  CREATININE 1.20 1.06  CALCIUM 9.1 9.3    Lipid Panel:     Component Value Date/Time   CHOL 185 06/16/2015 0604   TRIG 256* 06/16/2015 0604   HDL 43 06/16/2015 0604   CHOLHDL 4.3 06/16/2015 0604   VLDL 51* 06/16/2015 0604   LDLCALC 91 06/16/2015 0604   HgbA1c:  Lab Results  Component Value Date   HGBA1C 6.4* 06/16/2015   Urine Drug Screen:      Component Value Date/Time   LABOPIA POSITIVE* 06/15/2015 1907   COCAINSCRNUR NONE DETECTED 06/15/2015 1907   LABBENZ NONE DETECTED 06/15/2015 1907   AMPHETMU NONE DETECTED 06/15/2015 1907   THCU NONE DETECTED 06/15/2015 1907   LABBARB NONE DETECTED 06/15/2015 1907      IMAGING  Ct Angio Head W/cm &/or Wo Cm  06/17/2015  CLINICAL DATA:  Follow-up stroke. History of multiple sclerosis, hypertension, hyperlipidemia, cancer. EXAM: CT ANGIOGRAPHY HEAD TECHNIQUE: Multidetector CT imaging of the head was performed using the standard protocol during bolus administration of intravenous contrast. Multiplanar CT image reconstructions and MIPs were obtained to evaluate the vascular anatomy. CONTRAST:  50 cc Isovue 370 COMPARISON:  None. FINDINGS: CT HEAD INTRACRANIAL CONTENTS: The ventricles and sulci are normal for age. No intraparenchymal hemorrhage, mass effect nor midline shift. Patchy supratentorial white matter hypodensities. Focal loss of RIGHT frontal gray-white matter differentiation corresponding to known acute stroke. No abnormal extra-axial fluid collections. Basal cisterns are patent. Mild calcific atherosclerosis of the carotid siphons. No abnormal intracranial enhancement. ORBITS: The included ocular globes and orbital contents are non-suspicious. SINUSES: The mastoid aircells and included paranasal sinuses are well-aerated. SKULL/SOFT TISSUES: No skull fracture. No significant soft tissue swelling. Severe temporomandibular osteoarthrosis. CTA HEAD ANTERIOR CIRCULATION: Normal appearance of the cervical internal carotid arteries, petrous, cavernous and supra clinoid internal carotid arteries. Widely patent  anterior communicating artery. Normal appearance of the anterior and middle cerebral arteries. POSTERIOR CIRCULATION: Codominant vertebral arteries with normal appearance of the vertebral arteries, vertebrobasilar junction and basilar artery, as well as main branch vessels. Normal appearance of  the posterior cerebral arteries. No large vessel occlusion, hemodynamically significant stenosis, dissection, luminal irregularity, contrast extravasation or aneurysm within the anterior nor posterior circulation. IMPRESSION: CT HEAD: Evolving small RIGHT frontal/ MCA territory infarct. Moderate to severe white matter changes better characterized on recent MRI of the brain. CTA HEAD: Negative. Electronically Signed   By: Elon Alas M.D.   On: 06/17/2015 01:59   2D Echocardiogram  - Left ventricle: The cavity size was normal. There was mild focal basal hypertrophy of the septum. Systolic function was normal. The estimated ejection fraction was in the range of 60% to 65%. Wall motion was normal; there were no regional wall motion abnormalities. Doppler parameters are consistent with abnormal left ventricular relaxation (grade 1 diastolic dysfunction). - Aortic valve: There was mild regurgitation. Impressions:   - No cardiac source of emboli was indentified.  Carotid Doppler   There is 1-39% bilateral ICA stenosis. Vertebral artery flow is antegrade.     PHYSICAL EXAM Pleasant elderly gentleman not in distress. . Afebrile. Head is nontraumatic. Neck is supple without bruit.    Cardiac exam no murmur or gallop. Lungs are clear to auscultation. Distal pulses are well felt. Neurological Exam :  Awake alert oriented x 3 normal speech and language. Mild left lower face asymmetry. Tongue midline. No drift. Mild diminished fine finger movements on left. Orbits right over left upper extremity. Mild left grip weak.. Normal sensation . Normal coordination. ASSESSMENT/PLAN [name] is a 75 y.o. male with history of primary progressive MS, diastolic CHF and GERD presenting with slurred speech. He did not receive IV t-PA due to delay in arrival.   Stroke:  right MCA infarcts in R insula and posterior frontal lobe, embolic secondary unknown source  Resultant  Slurred speech,  and mild  left-sided weakness  MRI  R MCA branch vessel occlusion w/ infarcts R insula and posterior frontal love  MRA    (not ordered)  CTA HEAD: Negative.  Carotid Doppler  No significant stenosis   2D Echo  EF 60-65%. No source of embolus   LDL 91  HgbA1c 6.4 in March  Lovenox 40 mg sq daily for VTE prophylaxis Diet regular Room service appropriate?: Yes; Fluid consistency:: Thin  aspirin 81 mg daily prior to admission, now on aspirin 300 mg suppository daily  Patient counseled to be compliant with his antithrombotic medications  Ongoing aggressive stroke risk factor management  Therapy recommendations:  OP PT Disposition:  home Hypertension  Stable  Permissive hypertension (OK if < 220/120) but gradually normalize in 5-7 days  Long-term BP goal normotensive  Hyperlipidemia  Home meds:  lipitor 20, resumed in hospital  LDL 91, goal < 70  Continue statin at discharge  Other Stroke Risk Factors  Advanced age  Former Cigarette smoker  ETOH use, advised to drink no more than 2 drinks a day  Overweight, Body mass index is 29.83 kg/(m^2)., recommend weight loss, diet and exercise as appropriate   Family hx stroke (mother and father)  Coronary artery disease  Chronic diastolic CHF  Other Active Problems  Mild dementia  COPD  Depression  BPH  GERD    Hospital day # 2 I have personally examined this patient, reviewed notes, independently viewed imaging studies, participated in medical decision  making and plan of care. I have made any additions or clarifications directly to the above note.  He presented with embolic right MCA branch infarct and  needs ongoing evaluation with transesophageal echocardiogram and loop recorder . Greater than 50% time during this 15 minute visit was spent on counseling and coordination of care about stroke risk and prevention. Antony Contras, MD Medical Director [facility] Pager: [phone] 06/17/2015 5:21  PM     To contact Stroke Continuity provider, please refer to [email or web address] After hours, contact General Neurology

## 2015-06-17 NOTE — Consult Note (Signed)
Physical Medicine and Rehabilitation Consult Reason for Consult: Right MCA infarct Referring Physician: [facility]   HPI: [name] [name] is a 75 y.o. right handed male with history multiple sclerosis followed by neurology services at [facility], chronic diastolic congestive heart failure, hypertension, CAD maintained on aspirin 81 mg daily, COPD. Presented 06/15/2015 with slurred speech and altered mental status as well as mild gait ataxia. Per chart review patient lives with spouse independent prior to admission and still driving. One level home with level entry. MRI shows areas of acute infarction in the region of the insula and posterior frontal lobe on the right consistent with right MCA branch vessel occlusion. Echocardiogram with ejection fraction 123456 grade 1 diastolic dysfunction. Carotid Dopplers with no ICA stenosis. CTA of the head negative. Patient did not receive TPA. Tolerating a regular diet. Neurology consulted maintain on aspirin 325 mg daily. Subcutaneous Lovenox for DVT prophylaxis. Physical and occupational therapy evaluations completed with recommendations of physical medicine rehabilitation consult.   Review of Systems  Constitutional: Negative for fever and chills.  HENT: Negative for hearing loss.   Eyes: Positive for blurred vision. Negative for double vision.  Respiratory: Negative for cough.        Shortness of breath with heavy exertion  Cardiovascular: Negative for chest pain and palpitations.  Gastrointestinal: Positive for constipation. Negative for nausea and vomiting.       GERD  Genitourinary: Negative for dysuria and hematuria.  Musculoskeletal: Positive for joint pain.  Skin: Negative for rash.  Neurological: Positive for weakness and headaches. Negative for seizures and loss of consciousness.  Psychiatric/Behavioral: Positive for depression.  All other systems reviewed and are negative.  Past Medical History  Diagnosis Date  . MS  (multiple sclerosis) ([place])   . Arthritis     s/p TKR  . Right bundle branch block   . Brugada syndrome     Possible Type II Brugada ECG pattern. No family history of SCD, no syncope, no tachypalpitations.  Marland Kitchen COPD (chronic obstructive pulmonary disease) ([place])     Prior heavy smoker. PFTs (3/11): FVC 87%, FEV1 73%, ratio 0.57, DLCO 75%, TLC 121%. Moderate obstructive defect. PFTs (9/15): Only minimal obstruction, sugPrior heavy smoker. PFTs (3/11): FVC 87%, FEV1 73%, ratio 0.57, DLCO 75%, TLC 121%. Moderate obstructive defect. PFTs (9/15) minimal obstruction, poss asthma component   . GERD (gastroesophageal reflux disease)   . Depression     with bipolar tendencies  . Urinary retention with incomplete bladder emptying     receives botox injections and treatment for BPH through [facility]  . BPH (benign prostatic hyperplasia)   . CAD (coronary artery disease)     a. LHC 1/15 - mid LCx 50 and 60%, proximal RCA 50%  . HLD (hyperlipidemia)   . HTN (hypertension)   . Chronic diastolic CHF (congestive heart failure) ([place])   . LVH (left ventricular hypertrophy)     a. Echo 12/14 Inferior and distal septal HK, mild LVH, EF 50-55%, mild MR, mild LAE  . Bladder cancer (HCC)     [facility], Ta low grade papillary urothileal carcinoma  . DOE (dyspnea on exertion)     a. Myoview 8/09- EF 57%, no ischemia // b. Myoview (3/11) EF 68%, diaphragmatic attenuation, no ischemia //  c Echo (4/11) EF 0000000, mild diastolic dysfunction, PASP 38 mmHg // d. PFTs 9/15 minimal obstruction  /  e. CT negative for ILD  . Mitral regurgitation     Echo (4/11) with PISA  ERO 0.3 cm^2 and regurgitant volume 41 mL (moderate MR). Echo (12/14) with mild MR.   Marland Kitchen History of Doppler ultrasound     Carotid US 3/11 negative for significant stenosis  . Mild dementia   . Low testosterone   . CKD (chronic kidney disease)   . Lung nodule     a. CT in 2015 >> PET in 12/15 not sugg of malignancy   . Thoracic aortic aneurysm (HCC)     4.1 cm  2017   Past Surgical History  Procedure Laterality Date  . Total knee arthroplasty Right   . Transurethral resection of prostate    . Tonsillectomy    . Appendectomy    . Rotator cuff repair    . Hemorrhoid surgery    . Left heart catheterization with coronary angiogram N/A 01/05/2013    Procedure: LEFT HEART CATHETERIZATION WITH CORONARY ANGIOGRAM;  Surgeon: Larey Dresser, MD;  Location: [facility] CATH LAB;  Service: Cardiovascular;  Laterality: N/A;   Family History  Problem Relation Age of Onset  . Stroke Mother   . Stroke Father   . Hypertension Sister   . Kidney cancer Brother   . Arthritis Brother   . Other Brother     knee problems, Bil TKR  . Hypertension      family history   Social History:  reports that he quit smoking about 22 years ago. His smoking use included Cigarettes. He has a 80 pack-year smoking history. He has quit using smokeless tobacco. His smokeless tobacco use included Chew. He reports that he drinks alcohol. His drug history is not on file. Allergies:  Allergies  Allergen Reactions  . Acetylcholine     unknown  . Alcohol-Sulfur [Sulfur]     unknown  . Amitriptyline     unknown  . Bupivacaine     unknown  . Clomipramine Hcl     unknown  . Cocaine     unknown  . Desipramine     unknown  . Ergonovine     unknown  . Flecainide     unknown  . Lithium     unknown  . Loxapine     unknown  . Nortriptyline     unknown  . Oxcarbazepine     unknown  . Procainamide     unknown  . Procaine     unknwon  . Propafenone     unknown  . Propofol     unknown  . Trifluoperazine     unknown   Medications Prior to Admission  Medication Sig Dispense Refill  . amLODipine (NORVASC) 5 MG tablet Take 5 mg by mouth daily.    . ARIPiprazole (ABILIFY) 5 MG tablet TAKE 1 TABLET BY MOUTH EVERY DAY 30 tablet 5  . aspirin EC 81 MG tablet Take 81 mg by mouth every other day.     Marland Kitchen atorvastatin (LIPITOR) 20 MG tablet Take 20 mg by mouth daily.    . bisoprolol  (ZEBETA) 5 MG tablet TAKE ONE-HALF (1/2) TABLET BY MOUTH ONCE  DAILY    . furosemide (LASIX) 40 MG tablet Take 1 tablet (40 mg total) by mouth 2 (two) times daily. Please keep upcoming appointment for further refills 180 tablet 0  . HYDROcodone-acetaminophen (NORCO) 10-325 MG tablet Take 1 tablet by mouth every 6 (six) hours as needed for moderate pain or severe pain. 30 tablet 0  . naproxen sodium (ALEVE) 220 MG tablet Take 440 mg by mouth 2 (two) times daily with a  meal.    . omeprazole (PRILOSEC) 20 MG capsule Take 1 capsule (20 mg total) by mouth daily. 90 capsule 3  . oxybutynin (DITROPAN-XL) 5 MG 24 hr tablet Take 5 mg by mouth at bedtime.    . predniSONE (DELTASONE) 10 MG tablet Take 10 mg by mouth daily as needed (FOR PAIN).    Marland Kitchen PROAIR HFA 108 (90 BASE) MCG/ACT inhaler USE 1 TO 2 INHALATIONS EVERY 6 HOURS AS NEEDED FOR WHEEZING OR SHORTNESS OF BREATH 25.5 g 4  . rOPINIRole (REQUIP) 1 MG tablet Take 1 mg by mouth at bedtime.     Marland Kitchen SPIRIVA HANDIHALER 18 MCG inhalation capsule INHALE THE CONTENTS OF 1 CAPSULE WITH 2         INHALATIONS ONCE DAILY AS DIRECTED 1 capsule 11  . venlafaxine XR (EFFEXOR-XR) 150 MG 24 hr capsule Take 150 mg by mouth 2 (two) times daily.     . vitamin B-12 (CYANOCOBALAMIN) 1000 MCG tablet Take 1,000 mcg by mouth daily.      Home: Home Living Family/patient expects to be discharged to:: Private residence Living Arrangements: Spouse/significant other Available Help at Discharge: Family, Available 24 hours/day Type of Home: House Home Access: Level entry [place]: One level Bathroom Shower/Tub: Multimedia programmer: [place]: [place], Environmental consultant - 2 wheels  Lives With: Spouse  Functional History: Prior Function Level of Independence: Independent Comments: was just on a trip to [place] recently Functional Status:  Mobility: Bed Mobility Overal bed mobility: Modified Independent General bed mobility comments:  pt standing at sink with OT upon PT arrival Transfers Overall transfer level: Needs assistance Equipment used: None Transfers: Sit to/from Stand Sit to Stand: Min guard General transfer comment: supervision for safety Ambulation/Gait Ambulation/Gait assistance: Supervision, Min guard Ambulation Distance (Feet): 200 Feet Assistive device: None Gait Pattern/deviations: Step-through pattern General Gait Details: pt mildly guarded with decreased step length most likely due to impaired vision that he was unaware Gait velocity: slow    ADL: ADL Overall ADL's : Needs assistance/impaired Grooming: Oral care, Minimal assistance Grooming Details (indicate cue type and reason): sequence and to recall NPO Lower Body Dressing: Minimal assistance Lower Body Dressing Details (indicate cue type and reason): Pt with shoes placed on L side adn could not locate them. Once max cues provided able to don shoes Toilet Transfer: Minimal assistance Functional mobility during ADLs: Minimal assistance General ADL Comments: pt walking into wall on L side. pt with decr awareness to L visual field deficits. pt decr safety awareness. pt repeating questions. pt walking thearpist to the bathroom for him to void and no awarenes to foley  Cognition: Cognition Overall Cognitive Status: Impaired/Different from baseline Arousal/Alertness: Awake/alert Orientation Level: Oriented X4 (confused at times) Attention: Sustained Sustained Attention: Impaired Sustained Attention Impairment: Verbal basic, Verbal complex, Functional basic, Functional complex Memory: Impaired Memory Impairment: Storage deficit, Decreased recall of new information, Decreased short term memory Decreased Short Term Memory: Verbal basic, Verbal complex, Functional basic, Functional complex Awareness: Appears intact Problem Solving: Appears intact Executive Function: Writer: Impaired Organizing Impairment: Verbal basic, Verbal  complex, Functional basic, Functional complex Safety/Judgment: Impaired Comments: The patient is aware of deficits but in the moment will do things that he has just told you he's not safe to do.   Cognition Arousal/Alertness: Awake/alert Behavior During Therapy: WFL for tasks assessed/performed Overall Cognitive Status: Impaired/Different from baseline Area of Impairment: Safety/judgement Memory: Decreased short-term memory Safety/Judgement: Decreased awareness of deficits (unaware of vision deficits  on L)  Blood pressure 158/78, pulse 86, temperature 98.7 F (37.1 C), temperature source Oral, resp. rate 18, height 6' (1.829 m), weight 99.791 kg (220 lb), SpO2 96 %. Physical Exam  Vitals reviewed. Constitutional: He is oriented to person, place, and time. He appears well-developed and well-nourished.  HENT:  Head: Normocephalic and atraumatic.  Eyes: Conjunctivae and EOM are normal.  Neck: Normal range of motion. Neck supple. No thyromegaly present.  Cardiovascular: Normal rate and regular rhythm.   Murmur heard. Respiratory: Effort normal and breath sounds normal. No respiratory distress.  GI: Soft. Bowel sounds are normal. He exhibits no distension.  Musculoskeletal: He exhibits no edema or tenderness.  Neurological: He is alert and oriented to person, place, and time.  Follow simple commands.  He has some mild decrease in fine motor skills.  Right facial weakness Motor: 4+/5 grossly throughout Sensation intact to light touch DTRs 3+ b/l LE  Skin: Skin is warm and dry.  Psychiatric: He has a normal mood and affect. His behavior is normal. Thought content normal.    Results for orders placed or performed during the hospital encounter of 06/15/15 (from the past 24 hour(s))  Basic metabolic panel     Status: Abnormal   Collection Time: 06/17/15  3:47 AM  Result Value Ref Range   Sodium 135 135 - 145 mmol/L   Potassium 3.9 3.5 - 5.1 mmol/L   Chloride 101 101 - 111 mmol/L    CO2 25 22 - 32 mmol/L   Glucose, Bld 103 (H) 65 - 99 mg/dL   BUN 19 6 - 20 mg/dL   Creatinine, Ser 1.06 0.61 - 1.24 mg/dL   Calcium 9.3 8.9 - 10.3 mg/dL   GFR calc non Af Amer >60 >60 mL/min   GFR calc Af Amer >60 >60 mL/min   Anion gap 9 5 - 15   Ct Angio Head W/cm &/or Wo Cm  06/17/2015  CLINICAL DATA:  Follow-up stroke. History of multiple sclerosis, hypertension, hyperlipidemia, cancer. EXAM: CT ANGIOGRAPHY HEAD TECHNIQUE: Multidetector CT imaging of the head was performed using the standard protocol during bolus administration of intravenous contrast. Multiplanar CT image reconstructions and MIPs were obtained to evaluate the vascular anatomy. CONTRAST:  50 cc Isovue 370 COMPARISON:  None. FINDINGS: CT HEAD INTRACRANIAL CONTENTS: The ventricles and sulci are normal for age. No intraparenchymal hemorrhage, mass effect nor midline shift. Patchy supratentorial white matter hypodensities. Focal loss of RIGHT frontal gray-white matter differentiation corresponding to known acute stroke. No abnormal extra-axial fluid collections. Basal cisterns are patent. Mild calcific atherosclerosis of the carotid siphons. No abnormal intracranial enhancement. ORBITS: The included ocular globes and orbital contents are non-suspicious. SINUSES: The mastoid aircells and included paranasal sinuses are well-aerated. SKULL/SOFT TISSUES: No skull fracture. No significant soft tissue swelling. Severe temporomandibular osteoarthrosis. CTA HEAD ANTERIOR CIRCULATION: Normal appearance of the cervical internal carotid arteries, petrous, cavernous and supra clinoid internal carotid arteries. Widely patent anterior communicating artery. Normal appearance of the anterior and middle cerebral arteries. POSTERIOR CIRCULATION: Codominant vertebral arteries with normal appearance of the vertebral arteries, vertebrobasilar junction and basilar artery, as well as main branch vessels. Normal appearance of the posterior cerebral arteries. No  large vessel occlusion, hemodynamically significant stenosis, dissection, luminal irregularity, contrast extravasation or aneurysm within the anterior nor posterior circulation. IMPRESSION: CT HEAD: Evolving small RIGHT frontal/ MCA territory infarct. Moderate to severe white matter changes better characterized on recent MRI of the brain. CTA HEAD: Negative. Electronically Signed   By: Elon Alas  M.D.   On: 06/17/2015 01:59   Ct Head Wo Contrast  06/15/2015  CLINICAL DATA:  Slurred speech and confusion since Friday. History of dementia and multiple sclerosis. EXAM: CT HEAD WITHOUT CONTRAST TECHNIQUE: Contiguous axial images were obtained from the base of the skull through the vertex without intravenous contrast. COMPARISON:  08/12/2009 MRI FINDINGS: Sinuses/Soft tissues: Clear paranasal sinuses and mastoid air cells. Intracranial: Moderate periventricular white matter hypoattenuation is grossly similar to the prior MRI, given cross modality comparison. Mild cerebral and cerebellar atrophy. No mass lesion, hemorrhage, hydrocephalus, acute infarct, intra-axial, or extra-axial fluid collection. IMPRESSION: 1.  No acute intracranial abnormality. 2. Periventricular white matter hypoattenuation may relate to the clinical history of multiple sclerosis and/or chronic small vessel ischemic change. 3. Cerebral and cerebellar atrophy. Electronically Signed   By: Abigail Miyamoto M.D.   On: 06/15/2015 11:03   Mr Jeri Cos [id number] Contrast  06/15/2015  CLINICAL DATA:  Acute mental status changes. Headache. Numbness and tingling. History of multiple sclerosis. EXAM: MRI HEAD WITHOUT AND WITH CONTRAST TECHNIQUE: Multiplanar, multiecho pulse sequences of the brain and surrounding structures were obtained without and with intravenous contrast. CONTRAST:  17mL MULTIHANCE GADOBENATE DIMEGLUMINE 529 MG/ML IV SOLN COMPARISON:  CT same day.  MRI 08/12/2009. FINDINGS: Diffusion imaging shows scattered foci of restricted diffusion in the  insular and posterior frontal region consistent with right MCA branch vessel territory infarction. No other vascular territory acute infarction. No swelling or hemorrhage. The brainstem is normal. There are old small vessel cerebellar infarctions. Cerebral hemispheres show extensive chronic small vessel ischemic changes throughout the deep white matter. Given the history of multiple sclerosis, some of these white matter lesions could relate to chronic demyelination. No mass lesion, hemorrhage, hydrocephalus or extra-axial collection. No pituitary mass. No inflammatory sinus disease. No skull or skullbase lesion. Major vessels at the base of the brain show flow. After contrast administration, no abnormal enhancement occurs. IMPRESSION: Areas of acute infarction in the region of the insula and posterior frontal lobe on the right consistent with right MCA branch vessel occlusion. No swelling or hemorrhage. Extensive chronic small vessel ischemic changes elsewhere throughout the brain, progressive since the previous exam. Given the history of multiple sclerosis clinically, some of these white matter lesions could relate to chronic demyelination. Electronically Signed   By: Nelson Chimes M.D.   On: 06/15/2015 13:36    Assessment/Plan: Diagnosis: Right MCA infarct Labs and images independently reviewed.  Records reviewed and summated above. Stroke: Continue secondary stroke prophylaxis and Risk Factor Modification listed below:   Antiplatelet therapy:   Blood Pressure Management:  Continue current medication with prn's with permisive HTN per primary team Statin Agent:   Diabetes management:    1. Does the need for close, 24 hr/day medical supervision in concert with the patient's rehab needs make it unreasonable for this patient to be served in a less intensive setting? No 2. Co-Morbidities requiring supervision/potential complications: multiple sclerosis (cont meds), chronic diastolic congestive heart  failure (monitor weights), HTN (monitor and provide prns in accordance with increased physical exertion and pain), CAD (cont meds), COPD (monitor RR and O2 sats with increased physical activity), BPH (cont meds) 3. Due to bladder management, safety and patient education, does the patient require 24 hr/day rehab nursing? No 4. Does the patient require coordinated care of a physician, rehab nurse, PT (1-2 hrs/day, 5 days/week), OT (1-2 hrs/day, 5 days/week) and SLP (1-2 hrs/day, 5 days/week) to address physical and functional deficits in the context of the  above medical diagnosis(es)? Potentially Addressing deficits in the following areas: balance, endurance, locomotion, strength, transferring, bathing, dressing, toileting, cognition and psychosocial support 5. Can the patient actively participate in an intensive therapy program of at least 3 hrs of therapy per day at least 5 days per week? Yes 6. The potential for patient to make measurable gains while on inpatient rehab is fair 7. Anticipated functional outcomes upon discharge from inpatient rehab are n/a  with PT, n/a with OT, n/a with SLP. 8. Estimated rehab length of stay to reach the above functional goals is: N/A 9. Does the patient have adequate social supports and living environment to accommodate these discharge functional goals? N/A 10. Anticipated D/C setting: Home 11. Anticipated post D/C treatments: HH therapy and Home excercise program 12. Overall Rehab/Functional Prognosis: good  RECOMMENDATIONS: This patient's condition is appropriate for continued rehabilitative care in the following setting: Pt functionally improving.  Anticapate pt will cont to make functinoal gains.  Recommend [place] after completion of medical workup. Follow p with PM&R as outpt.  Patient has agreed to participate in recommended program. No Note that insurance prior authorization may be required for reimbursement for recommended care.  Comment: Rehab Admissions  Coordinator to follow up.  Delice Lesch, MD 06/17/2015

## 2015-06-17 NOTE — Progress Notes (Signed)
   06/17/15 0955  Clinical Encounter Type  Visited With Patient  Visit Type Initial  Spiritual Encounters  Spiritual Needs Prayer  On morning rounds Chaplain stopped by patient room.  Chaplain introduced herself and offered to be of spiritual support.  Patient and Chaplain had a friendly conversation.  Chaplain offered prayer.  Chaplain prayed with patient and asked if there was further support needed.  Chaplain offered further availability should it be needed.

## 2015-06-17 NOTE — Progress Notes (Signed)
Occupational Therapy Treatment Patient Details Name: [name] [name] MRN: [id number] DOB: 1940-06-26 Today's Date: 06/17/2015    History of present illness [name] [name] is a 75 y.o. male with medical history significant for multiple sclerosis, chronic diastolic heart failure on Lasix, hypertension, CAD, COPD, dyslipidemia, BPH, depression and GERD. Patient presented to the hospital today after developing slurred speech and mild confusion as well as mild gait ataxia since this past Friday. Evaluation in the ER was consistent with right MCA stroke on MRI.   OT comments  Pt continues to demonstrate executive problem solving deficits and decr attention. Pt verbalizes some awareness to attention deficits but minimal awareness to L visual field deficits. Pt very pleasant and requesting inpatient rehab. Pt states "i got to get better! I failed that test yesterday and that bothers me" ( pt referring to SLP cognitive assessment) Pt demonstrates balance deficits during adl this morning at sink level. Pt requires UE support for balance. Pt could greatly benefit from 7 day CIR admission to decr burden of care and incr awareness to L visual field deficits with compensatory strategies.    Follow Up Recommendations  CIR;Supervision/Assistance - 24 hour    Equipment Recommendations  None recommended by OT    Recommendations for Other Services Rehab consult    Precautions / Restrictions Precautions Precautions: Other (comment) Precaution Comments: vision deficits on L       Mobility Bed Mobility               General bed mobility comments: in chair on arrival  Transfers Overall transfer level: Needs assistance   Transfers: Sit to/from Stand Sit to Stand: Min guard              Balance Overall balance assessment: Needs assistance                                 ADL Overall ADL's : Needs assistance/impaired Eating/Feeding: Supervision/  safety;Sitting Eating/Feeding Details (indicate cue type and reason): cues to locate items. Breakfast arrived with just pancakes. pt states "i asked for just pancakes and hell that is what I got" No coffee no bacon or anything.  Grooming: Dance movement psychotherapist;Wash/dry hands;Oral care;Minimal assistance Grooming Details (indicate cue type and reason): needed cues to locate items. pt washing face and opening eyes with soap. pt states "this isnt tearfree.. it is burning like hell" Pt declines to brush teeth stating I did it this morning but then reports he will brush teeth but unable to locate items.  Upper Body Bathing: Min guard;Standing Upper Body Bathing Details (indicate cue type and reason): pt required L UE support on sink surface. pt states I can't stand up straight.  Lower Body Bathing: Min guard;Sit to/from stand   Upper Body Dressing : Min guard       Toilet Transfer: Magazine features editor Details (indicate cue type and reason): pt with urgency due to self removal of condom cath. pt states "it fell off."         Functional mobility during ADLs: Minimal assistance (cues for safety and avoid striking L shoulder on door frame) General ADL Comments: Pt continues to demonstrate L inattention.       Vision                 Additional Comments: L inattention   Perception     Praxis      Cognition   Behavior During  Therapy: WFL for tasks assessed/performed Overall Cognitive Status: Impaired/Different from baseline Area of Impairment: Safety/judgement;Awareness;Problem solving;Memory;Attention   Current Attention Level: Sustained Memory: Decreased short-term memory    Safety/Judgement: Decreased awareness of safety;Decreased awareness of deficits Awareness: Emergent Problem Solving: Slow processing;Difficulty sequencing General Comments: Pt reports "i woke up at 1am and I couldnt for the life of me tell where the hell i was. It took me a long time to figure it out." "I  need that rehabilitation because that one that did the swallowing yesterday.... she gave me a test and I failed it bad. that bothers me. I have lost my ability to hold onto things. You tell me something and in five minutes Ill forget it sure as the whole. "  Pt demonstrates decr awareness to adl items on L side . pt needs max cues to locate items. pt attempting to read a book to help his brain at the end of session. Pt needed cues for sequence of adl task.     Extremity/Trunk Assessment               Exercises     Shoulder Instructions       General Comments      Pertinent Vitals/ Pain       Pain Assessment: No/denies pain  Home Living                                          Prior Functioning/Environment              Frequency Min 2X/week     Progress Toward Goals  OT Goals(current goals can now be found in the care plan section)  Progress towards OT goals: Progressing toward goals  Acute Rehab OT Goals Patient Stated Goal: home OT Goal Formulation: With patient Time For Goal Achievement: 06/30/15 Potential to Achieve Goals: Good ADL Goals Additional ADL Goal #1: Pt will locate 3 out 4 items on L side of counter during ADL task Additional ADL Goal #2: Pt will complete adl task with min (A) cueing Additional ADL Goal #3: Pt will gather all items for adl at sink level supervision level  Plan Discharge plan remains appropriate    Co-evaluation                 End of Session Equipment Utilized During Treatment: Gait belt   Activity Tolerance Patient tolerated treatment well   Patient Left in chair;with call bell/phone within reach;with chair alarm set;with family/visitor present   Nurse Communication Mobility status;Precautions        Time: 0722-0752 OT Time Calculation (min): 30 min  Charges: OT General Charges $OT Visit: 1 Procedure OT Treatments $Self Care/Home Management : 23-37 mins  Parke Poisson B 06/17/2015, 8:24  AM  Jeri Modena   OTR/L Pager: [phone] Office: [phone] .

## 2015-06-17 NOTE — Care Management Important Message (Signed)
Important Message  Patient Details  Name: [name] [name] MRN: [id number] Date of Birth: 06/23/1940   Medicare Important Message Given:  Yes    Loann Quill 06/17/2015, 8:48 AM

## 2015-06-17 NOTE — Progress Notes (Signed)
PROGRESS NOTE    [name] [name]  [id number] DOB: 1940-06-29 DOA: 06/15/2015 PCP: [name], MD (Confirm with patient/family/NH records and if not entered, this HAS to be entered at [facility] point of entry. "No PCP" if truly none.)   Brief Narrative: (Start on day 1 of progress note - keep it brief and live)  Acute right MCA cva, 75 yo male with copd, depression, htn, ckd,. Clinically improving, neuro workup in progress  Assessment & Plan:   Principal Problem:   Acute right MCA stroke (HCC) Active Problems:   Essential hypertension   Depression   BPH (benign prostatic hyperplasia)   Chronic diastolic CHF (congestive heart failure) (HCC)   CAD (coronary artery disease)   COPD (chronic obstructive pulmonary disease) (HCC)   Hyperlipidemia   Gastroesophageal reflux disease without esophagitis  1. Cardiovascular. Patient euvolemic, echocardiogram with normal LV function EF 60 to 65 %, will hold on furosemide for now, continue bisprolol.  2, Pulmonary. No signs of aspiration, will continue to monitor oxymetry and supplemental 02 per [place] to target 02 sat above 92%. Will hold on diuresis for now.   3. Nephrology. Renal function with cr at 1.06 with K at 3.9, patient clinically euvolemic, will hold on furosemide. Continue to follow blood pressure.   4. Neurology. Patient awake and alert, no confusion but mild  slurred speech, will continue neuro checks, antiplatelet therapy and atorvastatin. Will continue ropinarole, venlafaxine, ariprazole. Per neurology recommendations for embolic work up with TEE.     DVT prophylaxis: (Lovenox/Heparin/SCD's/anticoagulated/None (if comfort care) Code Status: (Full/Partial - specify details) Family Communication: (Specify name, relationship & date discussed. NO "discussed with patient") Disposition Plan: (specify when and where you expect patient to be discharged). Include barriers to DC in this tab.   Consultants:    neurology  Procedures: (Don't include imaging studies which can be auto populated. Include things that cannot be auto populated i.e. Echo, Carotid and venous dopplers, Foley, Bipap, HD, tubes/drains, wound vac, central lines etc)   Antimicrobials: (specify start and planned stop date. Auto populated tables are space occupying and do not give end dates)     Subjective: Patient feeling better, no dyspnea or chest pain, speech improving. Patient very anxious to ambulate. No fever or chills, no nausea or vomiting.   Objective: Filed Vitals:   06/16/15 2155 06/17/15 0540 06/17/15 0947 06/17/15 1428  BP: 110/72 158/78 118/74 133/76  Pulse: 80 86 98 97  Temp: 98.1 F (36.7 C) 98.7 F (37.1 C) 98.3 F (36.8 C) 98.5 F (36.9 C)  TempSrc: Oral Oral Oral Oral  Resp: 18 18 20 20   Height:      Weight:      SpO2: 100% 96% 98% 99%   No intake or output data in the 24 hours ending 06/17/15 1525 Filed Weights   06/15/15 1530  Weight: 99.791 kg (220 lb)    Examination:  General exam: not in pain or dyspnea E ENT: no conjunctival pallor or icterus, oral mucosa moist. Respiratory system: Clear to auscultation. Respiratory effort normal. Mild decreased breath sounds at bases.  Cardiovascular system: S1 & S2 heard, RRR. No JVD, murmurs, rubs, gallops or clicks. No pedal edema. Gastrointestinal system: Abdomen is nondistended, soft and nontender. No organomegaly or masses felt. Normal bowel sounds heard. Central nervous system: Alert and oriented. No focal neurological deficits. Mild slurred speech, no focal motor deficits. Extremities: Symmetric 5 x 5 power. Skin: No rashes, lesions or ulcers      Data Reviewed: I  have personally reviewed following labs and imaging studies  CBC:  Recent Labs Lab 06/15/15 1010  WBC 9.3  NEUTROABS 5.2  HGB 14.3  HCT 45.4  MCV 94.8  PLT [id number]   Basic Metabolic Panel:  Recent Labs Lab 06/15/15 1010 06/17/15 0347  NA 137 135  K 4.6 3.9   CL 106 101  CO2 21* 25  GLUCOSE 119* 103*  BUN 25* 19  CREATININE 1.20 1.06  CALCIUM 9.1 9.3   GFR: Estimated Creatinine Clearance: 75.9 mL/min (by C-G formula based on Cr of 1.06). Liver Function Tests:  Recent Labs Lab 06/15/15 1010  AST 24  ALT 23  ALKPHOS 72  BILITOT 0.6  PROT 7.2  ALBUMIN 4.0   No results for input(s): LIPASE, AMYLASE in the last 168 hours.  Recent Labs Lab 06/15/15 1037  AMMONIA 38*   Coagulation Profile:  Recent Labs Lab 06/15/15 1010  INR 0.95   Cardiac Enzymes: No results for input(s): CKTOTAL, CKMB, CKMBINDEX, TROPONINI in the last 168 hours. BNP (last 3 results) No results for input(s): PROBNP in the last 8760 hours. HbA1C:  Recent Labs  06/16/15 0604  HGBA1C 6.4*   CBG:  Recent Labs Lab 06/15/15 0956  GLUCAP 123*   Lipid Profile:  Recent Labs  06/16/15 0604  CHOL 185  HDL 43  LDLCALC 91  TRIG 256*  CHOLHDL 4.3   Thyroid Function Tests: No results for input(s): TSH, T4TOTAL, FREET4, T3FREE, THYROIDAB in the last 72 hours. Anemia Panel: No results for input(s): VITAMINB12, FOLATE, FERRITIN, TIBC, IRON, RETICCTPCT in the last 72 hours. Sepsis Labs: No results for input(s): PROCALCITON, LATICACIDVEN in the last 168 hours.  No results found for this or any previous visit (from the past 240 hour(s)).       Radiology Studies: Ct Angio Head W/cm &/or Wo Cm  06/17/2015  CLINICAL DATA:  Follow-up stroke. History of multiple sclerosis, hypertension, hyperlipidemia, cancer. EXAM: CT ANGIOGRAPHY HEAD TECHNIQUE: Multidetector CT imaging of the head was performed using the standard protocol during bolus administration of intravenous contrast. Multiplanar CT image reconstructions and MIPs were obtained to evaluate the vascular anatomy. CONTRAST:  50 cc Isovue 370 COMPARISON:  None. FINDINGS: CT HEAD INTRACRANIAL CONTENTS: The ventricles and sulci are normal for age. No intraparenchymal hemorrhage, mass effect nor midline  shift. Patchy supratentorial white matter hypodensities. Focal loss of RIGHT frontal gray-white matter differentiation corresponding to known acute stroke. No abnormal extra-axial fluid collections. Basal cisterns are patent. Mild calcific atherosclerosis of the carotid siphons. No abnormal intracranial enhancement. ORBITS: The included ocular globes and orbital contents are non-suspicious. SINUSES: The mastoid aircells and included paranasal sinuses are well-aerated. SKULL/SOFT TISSUES: No skull fracture. No significant soft tissue swelling. Severe temporomandibular osteoarthrosis. CTA HEAD ANTERIOR CIRCULATION: Normal appearance of the cervical internal carotid arteries, petrous, cavernous and supra clinoid internal carotid arteries. Widely patent anterior communicating artery. Normal appearance of the anterior and middle cerebral arteries. POSTERIOR CIRCULATION: Codominant vertebral arteries with normal appearance of the vertebral arteries, vertebrobasilar junction and basilar artery, as well as main branch vessels. Normal appearance of the posterior cerebral arteries. No large vessel occlusion, hemodynamically significant stenosis, dissection, luminal irregularity, contrast extravasation or aneurysm within the anterior nor posterior circulation. IMPRESSION: CT HEAD: Evolving small RIGHT frontal/ MCA territory infarct. Moderate to severe white matter changes better characterized on recent MRI of the brain. CTA HEAD: Negative. Electronically Signed   By: Elon Alas M.D.   On: 06/17/2015 01:59  Scheduled Meds: . ARIPiprazole  5 mg Oral Daily  . aspirin  300 mg Rectal Daily   Or  . aspirin  325 mg Oral Daily  . atorvastatin  20 mg Oral q1800  . bisoprolol  2.5 mg Oral Daily  . enoxaparin (LOVENOX) injection  40 mg Subcutaneous Q24H  . furosemide  40 mg Oral BID  . oxybutynin  5 mg Oral QHS  . pantoprazole  40 mg Oral Daily  . rOPINIRole  1 mg Oral QHS  . tiotropium  18 mcg Inhalation  Daily  . venlafaxine XR  150 mg Oral BID  . vitamin B-12  1,000 mcg Oral Daily   Continuous Infusions:    LOS: 2 days     [name] Dudenhoeffer Gerome Apley, MD [facility] Pager [phone]  If 7PM-7AM, please contact night-coverage [email or web address] Password [facility] 06/17/2015, 3:25 PM

## 2015-06-17 NOTE — Progress Notes (Signed)
Physical Therapy Treatment Patient Details Name: [name] [name] MRN: [id number] DOB: 1940-08-23 Today's Date: 06/17/2015    History of Present Illness [name] [name] is a 75 y.o. male with medical history significant for multiple sclerosis, chronic diastolic heart failure on Lasix, hypertension, CAD, COPD, dyslipidemia, BPH, depression and GERD. Patient presented to the hospital today after developing slurred speech and mild confusion as well as mild gait ataxia since this past Friday. Evaluation in the ER was consistent with right MCA stroke on MRI.    PT Comments    Patient able to perform high level balance activities with supervision to minguard A.  Patient still with limited cognitive abilities this session exhibited with memory.  Will continue to benefit from skilled PT in the acute setting and recommend follow up outpatient PT at d/c.  Follow Up Recommendations  Supervision/Assistance - 24 hour;Outpatient PT     Equipment Recommendations  None recommended by PT    Recommendations for Other Services       Precautions / Restrictions Precautions Precaution Comments: vision deficits on L    Mobility  Bed Mobility               General bed mobility comments: in chair on arrival  Transfers Overall transfer level: Needs assistance Equipment used: None   Sit to Stand: Min guard         General transfer comment: LOB in standing to R after just waking up from nap  Ambulation/Gait Ambulation/Gait assistance: Supervision;Min guard Ambulation Distance (Feet): 220 Feet Assistive device: None Gait Pattern/deviations: Step-through pattern;Decreased stride length     General Gait Details: decreased trunk rotation, decreased arm swing, looking to right with head turned for conversaion, but not running into items due to wide open environment   Stairs Stairs: Yes Stairs assistance: Supervision Stair Management: Alternating pattern;Forwards;No rails;One rail  Right Number of Stairs: 5 General stair comments: initially negotiating without railings, then with one rail self selected pattern  Wheelchair Mobility    Modified Rankin (Stroke Patients Only) Modified Rankin (Stroke Patients Only) Pre-Morbid Rankin Score: No symptoms Modified Rankin: Moderate disability     Balance Overall balance assessment: Needs assistance   Sitting balance-Leahy Scale: Good                         High Level Balance Comments: stepping over and around obstacles with supervision, forward tandem, backwards walking, side stepping and forward marching    Cognition Arousal/Alertness: Awake/alert Behavior During Therapy: WFL for tasks assessed/performed Overall Cognitive Status: Impaired/Different from baseline Area of Impairment: Problem solving;Awareness;Safety/judgement;Attention   Current Attention Level: Sustained Memory: Decreased short-term memory   Safety/Judgement: Decreased awareness of deficits Awareness: Emergent Problem Solving: Slow processing;Difficulty sequencing      Exercises      General Comments General comments (skin integrity, edema, etc.): memory strategies with having pt recall to wife what he did in PT session using connected events with about 50% accuracy      Pertinent Vitals/Pain Pain Assessment: No/denies pain    Home Living                      Prior Function            PT Goals (current goals can now be found in the care plan section) Progress towards PT goals: Progressing toward goals    Frequency  Min 4X/week    PT Plan Current plan remains appropriate    Co-evaluation  End of Session Equipment Utilized During Treatment: Gait belt Activity Tolerance: Patient tolerated treatment well Patient left: in chair;with call bell/phone within reach;with family/visitor present;with chair alarm set     Time: 1352-1416 PT Time Calculation (min) (ACUTE ONLY): 24 min  Charges:   $Gait Training: 8-22 mins $Neuromuscular Re-education: 8-22 mins                    G Codes:      Reginia Naas 2015/07/03, 3:34 PM  Magda Kiel, [place] July 03, 2015

## 2015-06-17 NOTE — Progress Notes (Signed)
Outpt therapy is recommended at this time. [id number]

## 2015-06-17 NOTE — Care Management Note (Signed)
Case Management Note  Patient Details  Name: [name] [name] MRN: [id number] Date of Birth: 07/13/1940  Subjective/Objective:   Pt admitted with CVA. He is from home with his spouse.                 Action/Plan: Rec is for outpatient therapy. CM following for d/c needs.   Expected Discharge Date:                  Expected Discharge Plan:  Home/Self Care  In-House Referral:     Discharge planning Services     Post Acute Care Choice:    Choice offered to:     DME Arranged:    DME Agency:     HH Arranged:    HH Agency:     Status of Service:  In process, will continue to follow  Medicare Important Message Given:  Yes Date Medicare IM Given:    Medicare IM give by:    Date Additional Medicare IM Given:    Additional Medicare Important Message give by:     If discussed at [place] of Stay Meetings, dates discussed:    Additional Comments:  [name], RN 06/17/2015, 3:42 PM

## 2015-06-18 ENCOUNTER — Other Ambulatory Visit: Payer: Self-pay | Admitting: Family Medicine

## 2015-06-18 MED ORDER — ACETAMINOPHEN 325 MG PO TABS: 650.0000 mg | ORAL_TABLET | Freq: Four times a day (QID) | ORAL | Status: DC | PRN

## 2015-06-18 MED ORDER — ASPIRIN EC 81 MG PO TBEC: 81.0000 mg | DELAYED_RELEASE_TABLET | Freq: Every day | ORAL | Status: DC

## 2015-06-18 MED ORDER — FUROSEMIDE 40 MG PO TABS: 40.0000 mg | ORAL_TABLET | Freq: Every day | ORAL | Status: DC | PRN

## 2015-06-18 NOTE — Progress Notes (Signed)
Speech Language Pathology Treatment: Cognitive-Linquistic;Dysphagia  Patient Details Name: [name] [name] MRN: [id number] DOB: 30-Jun-1940 Today's Date: 06/18/2015 Time: [id number] SLP Time Calculation (min) (ACUTE ONLY): 18 min  Assessment / Plan / Recommendation Clinical Impression  Pt seen for dysphagia and cognition with wife at bedside. Dysarthria improving, intelligibility decreases with increase rate; minimal dysfluencies, however suspect this has improved as well. SLP provided education to slow rate, open mouth (increase ROM of lips/tongue) and pause between meals. Oropharyngeal swallow function is adequate, no difficulties observed with solid and liquid. Pt and wife report difficulty with working memory (mild premorbid deficits). SLP educated strategies such as writing information and ensuring reliable place to visualize. Recommend he continue ST at outpatient for dysarthria and higher level cognition/executive function.   HPI HPI: [name] [name] is a 75 y.o. male with medical history significant for multiple sclerosis, chronic diastolic heart failure on Lasix, hypertension, CAD, COPD, dyslipidemia, BPH, depression and GERD. Patient presented to the hospital today after developing slurred speech and mild confusion as well as mild gait ataxia since this past Friday. Evaluation in the ER was consistent with right MCA stroke on MRI. Formal neurological evaluation pending. Patient presented greater than 8 hours after symptoms began secure is not a candidate for thrombolytic therapy.  MRI showing areas of acute infarction in the region of the insula and posterior frontal lobe on the right consistent with right MCA branch vessel occlusion. No swelling or hemorrhage.      SLP Plan   (likely d/c today, rec outpatient ST)     Recommendations  Diet recommendations: Regular;Thin liquid Liquids provided via: Cup;Straw Medication Administration: Whole meds with liquid Supervision: Patient able  to self feed Compensations: Slow rate;Small sips/bites Postural Changes and/or Swallow Maneuvers: Seated upright 90 degrees             Oral Care Recommendations: Oral care BID Follow up Recommendations: Outpatient SLP Plan:  (likely d/c today, rec outpatient ST)     Edom, Brogan Martis Willis 06/18/2015, 10:27 AM  Orbie Pyo Colvin Caroli.Ed [employer] [phone]

## 2015-06-18 NOTE — Progress Notes (Signed)
STROKE TEAM PROGRESS NOTE   SUBJECTIVE (INTERVAL HISTORY) Wife at bedside. Dr. Leonie [name] discussed TEE and loop with patient and wife.    OBJECTIVE Temp:  [97.5 F (36.4 C)-98.9 F (37.2 C)] 98.9 F (37.2 C) (06/14 1053) Pulse Rate:  [72-97] 76 (06/14 1053) Cardiac Rhythm:  [-] Normal sinus rhythm;Bundle branch block (06/14 0700) Resp:  [18-20] 18 (06/14 1053) BP: (126-152)/(67-82) 152/71 mmHg (06/14 1053) SpO2:  [98 %-99 %] 99 % (06/14 1053)  CBC:   Recent Labs Lab 06/15/15 1010  WBC 9.3  NEUTROABS 5.2  HGB 14.3  HCT 45.4  MCV 94.8  PLT [id number]    Basic Metabolic Panel:   Recent Labs Lab 06/15/15 1010 06/17/15 0347  NA 137 135  K 4.6 3.9  CL 106 101  CO2 21* 25  GLUCOSE 119* 103*  BUN 25* 19  CREATININE 1.20 1.06  CALCIUM 9.1 9.3    Lipid Panel:     Component Value Date/Time   CHOL 185 06/16/2015 0604   TRIG 256* 06/16/2015 0604   HDL 43 06/16/2015 0604   CHOLHDL 4.3 06/16/2015 0604   VLDL 51* 06/16/2015 0604   LDLCALC 91 06/16/2015 0604   HgbA1c:  Lab Results  Component Value Date   HGBA1C 6.4* 06/16/2015   Urine Drug Screen:     Component Value Date/Time   LABOPIA POSITIVE* 06/15/2015 1907   COCAINSCRNUR NONE DETECTED 06/15/2015 1907   LABBENZ NONE DETECTED 06/15/2015 1907   AMPHETMU NONE DETECTED 06/15/2015 1907   THCU NONE DETECTED 06/15/2015 1907   LABBARB NONE DETECTED 06/15/2015 1907      IMAGING  Ct Angio Head W/cm &/or Wo Cm  06/17/2015  CLINICAL DATA:  Follow-up stroke. History of multiple sclerosis, hypertension, hyperlipidemia, cancer. EXAM: CT ANGIOGRAPHY HEAD TECHNIQUE: Multidetector CT imaging of the head was performed using the standard protocol during bolus administration of intravenous contrast. Multiplanar CT image reconstructions and MIPs were obtained to evaluate the vascular anatomy. CONTRAST:  50 cc Isovue 370 COMPARISON:  None. FINDINGS: CT HEAD INTRACRANIAL CONTENTS: The ventricles and sulci are normal for age. No  intraparenchymal hemorrhage, mass effect nor midline shift. Patchy supratentorial white matter hypodensities. Focal loss of RIGHT frontal gray-white matter differentiation corresponding to known acute stroke. No abnormal extra-axial fluid collections. Basal cisterns are patent. Mild calcific atherosclerosis of the carotid siphons. No abnormal intracranial enhancement. ORBITS: The included ocular globes and orbital contents are non-suspicious. SINUSES: The mastoid aircells and included paranasal sinuses are well-aerated. SKULL/SOFT TISSUES: No skull fracture. No significant soft tissue swelling. Severe temporomandibular osteoarthrosis. CTA HEAD ANTERIOR CIRCULATION: Normal appearance of the cervical internal carotid arteries, petrous, cavernous and supra clinoid internal carotid arteries. Widely patent anterior communicating artery. Normal appearance of the anterior and middle cerebral arteries. POSTERIOR CIRCULATION: Codominant vertebral arteries with normal appearance of the vertebral arteries, vertebrobasilar junction and basilar artery, as well as main branch vessels. Normal appearance of the posterior cerebral arteries. No large vessel occlusion, hemodynamically significant stenosis, dissection, luminal irregularity, contrast extravasation or aneurysm within the anterior nor posterior circulation. IMPRESSION: CT HEAD: Evolving small RIGHT frontal/ MCA territory infarct. Moderate to severe white matter changes better characterized on recent MRI of the brain. CTA HEAD: Negative. Electronically Signed   By: Elon Alas M.D.   On: 06/17/2015 01:59   2D Echocardiogram  - Left ventricle: The cavity size was normal. There was mild focal basal hypertrophy of the septum. Systolic function was normal. The estimated ejection fraction was in the range of 60% to 65%.  Wall motion was normal; there were no regional wall motion abnormalities. Doppler parameters are consistent with abnormal left ventricular relaxation  (grade 1 diastolic dysfunction). - Aortic valve: There was mild regurgitation. Impressions:   - No cardiac source of emboli was indentified.  Carotid Doppler   There is 1-39% bilateral ICA stenosis. Vertebral artery flow is antegrade.     PHYSICAL EXAM Pleasant elderly gentleman not in distress. . Afebrile. Head is nontraumatic. Neck is supple without bruit.    Cardiac exam no murmur or gallop. Lungs are clear to auscultation. Distal pulses are well felt. Neurological Exam :  Awake alert oriented x 3 normal speech and language. Mild left lower face asymmetry. Tongue midline. No drift. Mild diminished fine finger movements on left. Orbits right over left upper extremity. Mild left grip weak.. Normal sensation . Normal coordination.   ASSESSMENT/PLAN [name] is a 75 y.o. male with history of primary progressive MS, diastolic CHF and GERD presenting with slurred speech. He did not receive IV t-PA due to delay in arrival.   Stroke:  right MCA infarcts in R insula and posterior frontal lobe, embolic secondary unknown source  Resultant  Slurred speech,  and mild left-sided weakness  MRI  R MCA branch vessel occlusion w/ infarcts R insula and posterior frontal love  MRA    (not ordered)   CTA HEAD: Negative.  Carotid Doppler  No significant stenosis   2D Echo  EF 60-65%. No source of embolus   Outpatient TEE to look for embolic source. Requested with [place].  If positive for PFO (patent foramen ovale), check bilateral lower extremity venous dopplers to rule out DVT as possible source of stroke.  If TEE negative, a [facility] electrophysiologist will consult and consider placement of an implantable loop recorder to evaluate for atrial fibrillation as etiology of stroke. This has been explained to patient/family by [name] [name] and they are agreeable. This will be scheduled as an OP by cardiology if needed.  LDL 91  HgbA1c 6.4  in March  Lovenox 40 mg sq daily for VTE prophylaxis Diet regular Room service appropriate?: Yes; Fluid consistency:: Thin  aspirin 81 mg daily prior to admission, now on aspirin 325 mg daily  Patient counseled to be compliant with his antithrombotic medications  Ongoing aggressive stroke risk factor management  Therapy recommendations:  OP PT, OT  Disposition:  [place] for discharge from stroke standpoint NOTHING FURTHER TO ADD FROM THE STROKE STANDPOINT Patient has a 10-15% risk of having another stroke over the next year, the highest risk is within 2 weeks of the most recent stroke/TIA (risk of having a stroke following a stroke or TIA is the same). Ongoing risk factor control by Primary Care Physician Stroke Service will sign off. Please call should any needs arise. Follow-up Stroke Clinic at [facility] with Dr. Antony Contras in 2 months, order placed.  Hypertension  Stable  Permissive hypertension (OK if < 220/120) but gradually normalize in 5-7 days  Long-term BP goal normotensive  Hyperlipidemia  Home meds:  lipitor 20, resumed in hospital  LDL 91, goal < 70  Continue statin at discharge  Other Stroke Risk Factors  Advanced age  Former Cigarette smoker  ETOH use, advised to drink no more than 2 drinks a day  Overweight, Body mass index is 29.83 kg/(m^2)., recommend weight loss, diet and exercise as appropriate   Family hx stroke (mother and father)  Coronary artery  disease  Chronic diastolic CHF  Other Active Problems  Mild dementia  COPD  Depression  BPH  GERD  Hospital day # [place] for Pager information 06/18/2015 11:11 AM  I have personally examined this patient, reviewed notes, independently viewed imaging studies, participated in medical decision making and plan of care. I have made any additions or clarifications directly to the above note. Agree with note above. .  Recommend discharging patient home and arrange for outpatient TEE and loop recorder. I had a long discussion with the patient and wife regarding the need for doing this tests and answered questions. Greater than 50% time during this 25 minute visit was spent on counseling and coordination of care about stroke risk and stroke prevention. Stroke team will sign off. Follow-up as an outpatient in stroke clinic in 2 months or call earlier if necessary.  Antony Contras, MD Medical Director [facility] Pager: [phone] 06/18/2015 1:34 PM  To contact Stroke Continuity provider, please refer to [email or web address] After hours, contact General Neurology

## 2015-06-18 NOTE — Progress Notes (Signed)
Discharge orders received, Pt for discharge home today with home health PT per [place]. IV d/c'd. D/c instructions and RX given with verbalized understanding. Family at bedside to assist patient with discharge. Staff bought pt downstairs via wheelchair.

## 2015-06-18 NOTE — Discharge Summary (Signed)
[name] [name], is a 75 y.o. male  DOB 24-Aug-1940  MRN [id number].  Admission date:  06/15/2015  Admitting Physician  Geradine Girt, DO  Discharge Date:  06/18/2015   Primary MD  [name], MD  Recommendations for primary care physician for things to follow:  Patient has been discharged home, he'll follow-up with outpatient physical therapy, he will need an outpatient transesophageal echocardiogram and possibly a Holter monitor. Patient was instructed to take aspirin daily, his furosemide has been changed to only as needed for swelling. Please follow-up on kidney function and electrolytes.   Admission Diagnosis  Acute right MCA stroke ([place]) [I63.511] Acute CVA (cerebrovascular accident) [facility]) [I63.9]   Discharge Diagnosis  Acute right MCA stroke ([place]) [I63.511] Acute CVA (cerebrovascular accident) ([place]) [I63.9]   Principal Problem:   Acute right MCA stroke ([place]) Active Problems:   Essential hypertension   Depression   BPH (benign prostatic hyperplasia)   Chronic diastolic CHF (congestive heart failure) (HCC)   CAD (coronary artery disease)   COPD (chronic obstructive pulmonary disease) (HCC)   Hyperlipidemia   Gastroesophageal reflux disease without esophagitis      Past Medical History  Diagnosis Date  . MS (multiple sclerosis) ([place])   . Arthritis     s/p TKR  . Right bundle branch block   . Brugada syndrome     Possible Type II Brugada ECG pattern. No family history of SCD, no syncope, no tachypalpitations.  Marland Kitchen COPD (chronic obstructive pulmonary disease) ([place])     Prior heavy smoker. PFTs (3/11): FVC 87%, FEV1 73%, ratio 0.57, DLCO 75%, TLC 121%. Moderate obstructive defect. PFTs (9/15): Only minimal obstruction, sugPrior heavy smoker. PFTs (3/11): FVC 87%, FEV1 73%, ratio 0.57, DLCO 75%, TLC 121%. Moderate obstructive defect. PFTs (9/15) minimal obstruction, poss asthma  component   . GERD (gastroesophageal reflux disease)   . Depression     with bipolar tendencies  . Urinary retention with incomplete bladder emptying     receives botox injections and treatment for BPH through [facility]  . BPH (benign prostatic hyperplasia)   . CAD (coronary artery disease)     a. LHC 1/15 - mid LCx 50 and 60%, proximal RCA 50%  . HLD (hyperlipidemia)   . HTN (hypertension)   . Chronic diastolic CHF (congestive heart failure) ([place])   . LVH (left ventricular hypertrophy)     a. Echo 12/14 Inferior and distal septal HK, mild LVH, EF 50-55%, mild MR, mild LAE  . Bladder cancer (HCC)     [facility], [facility] low grade papillary urothileal carcinoma  . DOE (dyspnea on exertion)     a. Myoview 8/09- EF 57%, no ischemia // b. Myoview (3/11) EF 68%, diaphragmatic attenuation, no ischemia //  c Echo (4/11) EF 0000000, mild diastolic dysfunction, PASP 38 mmHg // d. PFTs 9/15 minimal obstruction  /  e. CT negative for ILD  . Mitral regurgitation     Echo (4/11) with PISA ERO 0.3 cm^2 and regurgitant volume 41 mL (moderate MR). Echo (12/14)  with mild MR.   Marland Kitchen History of Doppler ultrasound     Carotid US 3/11 negative for significant stenosis  . Mild dementia   . Low testosterone   . CKD (chronic kidney disease)   . Lung nodule     a. CT in 2015 >> PET in 12/15 not sugg of malignancy   . Thoracic aortic aneurysm (HCC)     4.1 cm 2017    Past Surgical History  Procedure Laterality Date  . Total knee arthroplasty Right   . Transurethral resection of prostate    . Tonsillectomy    . Appendectomy    . Rotator cuff repair    . Hemorrhoid surgery    . Left heart catheterization with coronary angiogram N/A 01/05/2013    Procedure: LEFT HEART CATHETERIZATION WITH CORONARY ANGIOGRAM;  Surgeon: Larey Dresser, MD;  Location: [facility] CATH LAB;  Service: Cardiovascular;  Laterality: N/A;       HPI  from the history and physical done on the day of admission:    This is a 75 year old male who comes to  the hospital with the chief complaint of slurred speech and altered mental status. For last 48 hours prior to presentation patient was noted to be more confused, had a slurred speech and gait ataxia.  On his initial physical examination his systolic blood pressure was 151 and 181, heart rate 70, respiratory rate 16, oxygen saturation 96%. He was awake and alert, his mucous membranes were moist, his lungs were clear to auscultation, no wheezing, rales, rhonchi. Heart S1-S2 present rhythmic no gallops or murmurs. Her abdomen was soft and nontender, no lower extremities edema. Patient was noted to have slurred speech but no significant focal neurologic motor deficit. His serum sodium was 137, potassium 4.6, creatinine 1.20, white cell count 9.3 with a hemoglobin 14.3, urine analysis with negative leukocytes, white cells 6-30, UDS positive for opiates, his CT had no signs of acute ischemia, his MRI showed acute infarction in the region of the insula and posterior frontal lobe on the right consistent with right MCA branch vessel occlusion.  Patient was admitted to hospital working diagnosis of acute CVA.     Hospital Course:    1. Cardiovascular. Patient remained hemodynamically stable, he was continued on bisoprolol. Clinically euvolemic, his furosemide was held. He remained on sinus rhythm on telemetry, his EKG showed sinus rhythm with a left axis deviation/left anterior fascicular block with a right bundle branch block. His echocardiogram showed ejection fraction 6065% with abnormal left ventricle relaxation grade 1 diastolic dysfunction  2. Pulmonary no signs of upper infection or aspiration. Patient was evaluated by speech pathologist recommend regular thin liquid diet. Had oximetry monitor and supplemental oxygen while he was in hospital.  3. Nephrology. Patient creatinine has been between 1.1 and 1.2, by the time of discharge is 1.06. Will recommend patient take furosemide only as needed for her  extremity edema to avoid renal injury and electrode abnormalities. Patient has had hyperkalemia in the past we'll hold on potassium supplements.  4. Neurology. Patient's symptoms have been presently getting better, patient will need outpatient speech therapy, patient continue taking aspirin once daily. Carotid ultrasonography show no significant stenosis. Patient will continue statin therapy. Will resume ropinirole, venlafaxine, Abilify. He will continue neurologic workup as an outpatient, he will need a transesophageal echocardiogram and possibly Holter monitor  Discharge Condition: Stable  Follow UP   neurology and primary care physician  Consults obtained - neurology  Diet and Activity recommendation: See  Discharge Instructions below  Discharge Instructions    Discharge Instructions    Ambulatory referral to Physical Medicine Rehab    Complete by:  As directed   Stroke follow up     Diet - low sodium heart healthy    Complete by:  As directed      Discharge instructions    Complete by:  As directed   Please follow with primary care provider in 7 days.     Increase activity slowly    Complete by:  As directed              Discharge Medications       Medication List    STOP taking these medications        ALEVE 220 MG tablet  Generic drug:  naproxen sodium      TAKE these medications        amLODipine 5 MG tablet  Commonly known as:  NORVASC  Take 5 mg by mouth daily.     ARIPiprazole 5 MG tablet  Commonly known as:  ABILIFY  TAKE 1 TABLET DAILY     aspirin EC 81 MG tablet  Take 1 tablet (81 mg total) by mouth daily.     atorvastatin 20 MG tablet  Commonly known as:  LIPITOR  Take 20 mg by mouth daily.     bisoprolol 5 MG tablet  Commonly known as:  ZEBETA  TAKE ONE-HALF (1/2) TABLET BY MOUTH ONCE  DAILY     furosemide 40 MG tablet  Commonly known as:  LASIX  Take 1 tablet (40 mg total) by mouth daily as needed (swelling). Please keep upcoming  appointment for further refills     HYDROcodone-acetaminophen 10-325 MG tablet  Commonly known as:  NORCO  Take 1 tablet by mouth every 6 (six) hours as needed for moderate pain or severe pain.     omeprazole 20 MG capsule  Commonly known as:  PRILOSEC  Take 1 capsule (20 mg total) by mouth daily.     oxybutynin 5 MG 24 hr tablet  Commonly known as:  DITROPAN-XL  Take 5 mg by mouth at bedtime.     predniSONE 10 MG tablet  Commonly known as:  DELTASONE  Take 10 mg by mouth daily as needed (FOR PAIN).     PROAIR HFA 108 (90 Base) MCG/ACT inhaler  Generic drug:  albuterol  USE 1 TO 2 INHALATIONS EVERY 6 HOURS AS NEEDED FOR WHEEZING OR SHORTNESS OF BREATH     rOPINIRole 1 MG tablet  Commonly known as:  REQUIP  Take 1 mg by mouth at bedtime.     SPIRIVA HANDIHALER 18 MCG inhalation capsule  Generic drug:  tiotropium  INHALE THE CONTENTS OF 1 CAPSULE WITH 2         INHALATIONS ONCE DAILY AS DIRECTED     venlafaxine XR 150 MG 24 hr capsule  Commonly known as:  EFFEXOR-XR  Take 150 mg by mouth 2 (two) times daily.     vitamin B-12 1000 MCG tablet  Commonly known as:  CYANOCOBALAMIN  Take 1,000 mcg by mouth daily.        Major procedures and Radiology Reports - PLEASE review detailed and final reports for all details, in brief -      Ct Angio Head W/cm &/or Wo Cm  06/17/2015  CLINICAL DATA:  Follow-up stroke. History of multiple sclerosis, hypertension, hyperlipidemia, cancer. EXAM: CT ANGIOGRAPHY HEAD TECHNIQUE: Multidetector CT imaging of the head was performed  using the standard protocol during bolus administration of intravenous contrast. Multiplanar CT image reconstructions and MIPs were obtained to evaluate the vascular anatomy. CONTRAST:  50 cc Isovue 370 COMPARISON:  None. FINDINGS: CT HEAD INTRACRANIAL CONTENTS: The ventricles and sulci are normal for age. No intraparenchymal hemorrhage, mass effect nor midline shift. Patchy supratentorial white matter hypodensities.  Focal loss of RIGHT frontal gray-white matter differentiation corresponding to known acute stroke. No abnormal extra-axial fluid collections. Basal cisterns are patent. Mild calcific atherosclerosis of the carotid siphons. No abnormal intracranial enhancement. ORBITS: The included ocular globes and orbital contents are non-suspicious. SINUSES: The mastoid aircells and included paranasal sinuses are well-aerated. SKULL/SOFT TISSUES: No skull fracture. No significant soft tissue swelling. Severe temporomandibular osteoarthrosis. CTA HEAD ANTERIOR CIRCULATION: Normal appearance of the cervical internal carotid arteries, petrous, cavernous and supra clinoid internal carotid arteries. Widely patent anterior communicating artery. Normal appearance of the anterior and middle cerebral arteries. POSTERIOR CIRCULATION: Codominant vertebral arteries with normal appearance of the vertebral arteries, vertebrobasilar junction and basilar artery, as well as main branch vessels. Normal appearance of the posterior cerebral arteries. No large vessel occlusion, hemodynamically significant stenosis, dissection, luminal irregularity, contrast extravasation or aneurysm within the anterior nor posterior circulation. IMPRESSION: CT HEAD: Evolving small RIGHT frontal/ MCA territory infarct. Moderate to severe white matter changes better characterized on recent MRI of the brain. CTA HEAD: Negative. Electronically Signed   By: Elon Alas M.D.   On: 06/17/2015 01:59   Ct Head Wo Contrast  06/15/2015  CLINICAL DATA:  Slurred speech and confusion since Friday. History of dementia and multiple sclerosis. EXAM: CT HEAD WITHOUT CONTRAST TECHNIQUE: Contiguous axial images were obtained from the base of the skull through the vertex without intravenous contrast. COMPARISON:  08/12/2009 MRI FINDINGS: Sinuses/Soft tissues: Clear paranasal sinuses and mastoid air cells. Intracranial: Moderate periventricular white matter hypoattenuation is  grossly similar to the prior MRI, given cross modality comparison. Mild cerebral and cerebellar atrophy. No mass lesion, hemorrhage, hydrocephalus, acute infarct, intra-axial, or extra-axial fluid collection. IMPRESSION: 1.  No acute intracranial abnormality. 2. Periventricular white matter hypoattenuation may relate to the clinical history of multiple sclerosis and/or chronic small vessel ischemic change. 3. Cerebral and cerebellar atrophy. Electronically Signed   By: Abigail Miyamoto M.D.   On: 06/15/2015 11:03   Ct Chest Wo Contrast  06/05/2015  CLINICAL DATA:  Lung nodule followup EXAM: CT CHEST WITHOUT CONTRAST TECHNIQUE: Multidetector CT imaging of the chest was performed following the standard protocol without IV contrast. COMPARISON:  08/07/2014 FINDINGS: Mediastinum/Nodes: Coronary, aortic arch, and branch vessel atherosclerotic vascular disease. 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 Thick atheromatous calcification along the posterior margin of the descending thoracic aorta near the hiatus, extending into the lumen, as on prior exam, compatible with chronic calcified mural thrombus. No pathologic thoracic adenopathy. Lungs/Pleura: 0.6 by 0.5 cm right middle lobe pulmonary nodule, no change 06/26/2013. There are 3 nodular spiculated densities in the right middle lobe inferomedially. One of these measures 8 mm in thickness on image 109 series 3, previously the same on 06/06/2013. Another measures 2.4 by 1.1 cm on image 108 series 3, previously 2.0 by 1.6 cm in 2015. The most anterior, which extends adjacent to the diaphragm, measures 2.9 by 1.8 cm, formerly 2.8 by 1.6 cm by my measurements on 06/26/2013. Accordingly these 3 spiculated nodules are essentially stable as well. These nodules have no hypermetabolic activity on PET-CT of 12/11/2013. There is some scarring in the lingula which appears stable. No new  nodule. Upper abdomen: Unremarkable Musculoskeletal: Thoracic spondylosis. IMPRESSION: 1.  The nodules are stable from 06/26/2013. More spiculated nodules inferomedially in the right middle lobe were not previously positive on PET scan and accordingly are likely benign. The 5 by 6 mm nodule is also stable over the past 2 years. This 2 year stability is compatible with benign process, and based on current guidelines, further follow up surveillance is not required. 2. 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 Recommend annual imaging followup by CTA or MRA. This recommendation follows 2010 ACCF/AHA/AATS/ACR/ASA/SCA/SCAI/SIR/STS/SVM Guidelines for the Diagnosis and Management of Patients with Thoracic Aortic Disease. Circulation. 2010; 121: [id number] 3. Coronary, aortic arch, and branch vessel atherosclerotic vascular disease. Electronically Signed   By: Van Clines M.D.   On: 06/05/2015 17:00   Mr Jeri Cos [id number] Contrast  06/15/2015  CLINICAL DATA:  Acute mental status changes. Headache. Numbness and tingling. History of multiple sclerosis. EXAM: MRI HEAD WITHOUT AND WITH CONTRAST TECHNIQUE: Multiplanar, multiecho pulse sequences of the brain and surrounding structures were obtained without and with intravenous contrast. CONTRAST:  67mL MULTIHANCE GADOBENATE DIMEGLUMINE 529 MG/ML IV SOLN COMPARISON:  CT same day.  MRI 08/12/2009. FINDINGS: Diffusion imaging shows scattered foci of restricted diffusion in the insular and posterior frontal region consistent with right MCA branch vessel territory infarction. No other vascular territory acute infarction. No swelling or hemorrhage. The brainstem is normal. There are old small vessel cerebellar infarctions. Cerebral hemispheres show extensive chronic small vessel ischemic changes throughout the deep white matter. Given the history of multiple sclerosis, some of these white matter lesions could relate to chronic demyelination. No mass lesion, hemorrhage, hydrocephalus or extra-axial collection. No pituitary mass. No inflammatory sinus disease. No  skull or skullbase lesion. Major vessels at the base of the brain show flow. After contrast administration, no abnormal enhancement occurs. IMPRESSION: Areas of acute infarction in the region of the insula and posterior frontal lobe on the right consistent with right MCA branch vessel occlusion. No swelling or hemorrhage. Extensive chronic small vessel ischemic changes elsewhere throughout the brain, progressive since the previous exam. Given the history of multiple sclerosis clinically, some of these white matter lesions could relate to chronic demyelination. Electronically Signed   By: Nelson Chimes M.D.   On: 06/15/2015 13:36    Micro Results     No results found for this or any previous visit (from the past 240 hour(s)).     Today   Subjective    [name] [name] is feeling much better, denies any focal weakness, no nausea, no vomiting or diarrhea. Objective   Blood pressure 152/71, pulse 76, temperature 98.9 F (37.2 C), temperature source Oral, resp. rate 18, height 6' (1.829 m), weight 99.791 kg (220 lb), SpO2 99 %.  No intake or output data in the 24 hours ending 06/18/15 1250  Exam Gen. Awake and alert Oral mucosa moist Neck no JVD or lymphadenopathy Chest with clear lungs bilaterally, heart S1-S2 present rhythmic no gallops or murmurs Abdomen soft nontender Extremity no edema Neurologically nonfocal   Data Review   CBC w Diff:  Lab Results  Component Value Date   WBC 9.3 06/15/2015   HGB 14.3 06/15/2015   HCT 45.4 06/15/2015   PLT 373 06/15/2015   LYMPHOPCT 28 06/15/2015   MONOPCT 9 06/15/2015   EOSPCT 7 06/15/2015   BASOPCT 1 06/15/2015    CMP:  Lab Results  Component Value Date   NA 135 06/17/2015   K 3.9 06/17/2015  CL 101 06/17/2015   CO2 25 06/17/2015   BUN 19 06/17/2015   CREATININE 1.06 06/17/2015   CREATININE 1.12 03/31/2015   PROT 7.2 06/15/2015   ALBUMIN 4.0 06/15/2015   BILITOT 0.6 06/15/2015   ALKPHOS 72 06/15/2015   AST 24  06/15/2015   ALT 23 06/15/2015  .   Total Time in preparing paper work, data evaluation and todays exam - 45 minutes  Tawni Millers M.D on 06/18/2015 at 12:50 PM  [facility]   Office  [phone]

## 2015-06-18 NOTE — Care Management Note (Signed)
Case Management Note  Patient Details  Name: [name] [name] MRN: [id number] Date of Birth: 06-28-1940  Subjective/Objective:                    Action/Plan: Pt discharging home with self care. Orders for outpatient ST and PT. Patient lives in [place], [place]. MD did paper prescriptions for the therapy services. Bedside RN updated.  Expected Discharge Date:                  Expected Discharge Plan:  Home/Self Care  In-House Referral:     Discharge planning Services  CM Consult  Post Acute Care Choice:    Choice offered to:     DME Arranged:    DME Agency:     HH Arranged:    [place] Agency:     Status of Service:  Completed, signed off  Medicare Important Message Given:  Yes Date Medicare IM Given:    Medicare IM give by:    Date Additional Medicare IM Given:    Additional Medicare Important Message give by:     If discussed at [place] of Stay Meetings, dates discussed:    Additional Comments:  SRIHAN MATSUOKA, RN 06/18/2015, 1:31 PM

## 2015-06-20 ENCOUNTER — Emergency Department (HOSPITAL_COMMUNITY): Payer: Medicare Other

## 2015-06-20 ENCOUNTER — Inpatient Hospital Stay (HOSPITAL_COMMUNITY)
Admission: EM | Admit: 2015-06-20 | Discharge: 2015-06-23 | DRG: 690 | Disposition: A | Discharge status: Home / Self Care | Payer: Medicare Other | Attending: Internal Medicine | Admitting: Internal Medicine

## 2015-06-20 ENCOUNTER — Encounter: Payer: Self-pay | Admitting: Family Medicine

## 2015-06-20 ENCOUNTER — Encounter (HOSPITAL_COMMUNITY): Payer: Self-pay | Admitting: Emergency Medicine

## 2015-06-20 ENCOUNTER — Ambulatory Visit (INDEPENDENT_AMBULATORY_CARE_PROVIDER_SITE_OTHER): Payer: Medicare Other | Admitting: Family Medicine

## 2015-06-20 VITALS — BP 140/90 | HR 88 | Temp 98.2°F | Resp 18

## 2015-06-20 DIAGNOSIS — I2510 Atherosclerotic heart disease of native coronary artery without angina pectoris: Secondary | ICD-10-CM | POA: Diagnosis present

## 2015-06-20 DIAGNOSIS — J449 Chronic obstructive pulmonary disease, unspecified: Secondary | ICD-10-CM | POA: Diagnosis present

## 2015-06-20 DIAGNOSIS — K219 Gastro-esophageal reflux disease without esophagitis: Secondary | ICD-10-CM | POA: Diagnosis present

## 2015-06-20 DIAGNOSIS — I130 Hypertensive heart and chronic kidney disease with heart failure and stage 1 through stage 4 chronic kidney disease, or unspecified chronic kidney disease: Secondary | ICD-10-CM | POA: Diagnosis present

## 2015-06-20 DIAGNOSIS — N3000 Acute cystitis without hematuria: Secondary | ICD-10-CM | POA: Diagnosis not present

## 2015-06-20 DIAGNOSIS — R002 Palpitations: Secondary | ICD-10-CM

## 2015-06-20 DIAGNOSIS — I6789 Other cerebrovascular disease: Secondary | ICD-10-CM | POA: Diagnosis not present

## 2015-06-20 DIAGNOSIS — N189 Chronic kidney disease, unspecified: Secondary | ICD-10-CM | POA: Diagnosis present

## 2015-06-20 DIAGNOSIS — K5900 Constipation, unspecified: Secondary | ICD-10-CM | POA: Diagnosis present

## 2015-06-20 DIAGNOSIS — I639 Cerebral infarction, unspecified: Secondary | ICD-10-CM

## 2015-06-20 DIAGNOSIS — I69328 Other speech and language deficits following cerebral infarction: Secondary | ICD-10-CM

## 2015-06-20 DIAGNOSIS — F329 Major depressive disorder, single episode, unspecified: Secondary | ICD-10-CM | POA: Diagnosis present

## 2015-06-20 DIAGNOSIS — Z79899 Other long term (current) drug therapy: Secondary | ICD-10-CM

## 2015-06-20 DIAGNOSIS — I110 Hypertensive heart disease with heart failure: Secondary | ICD-10-CM | POA: Diagnosis present

## 2015-06-20 DIAGNOSIS — N401 Enlarged prostate with lower urinary tract symptoms: Secondary | ICD-10-CM | POA: Diagnosis present

## 2015-06-20 DIAGNOSIS — I4510 Unspecified right bundle-branch block: Secondary | ICD-10-CM | POA: Diagnosis present

## 2015-06-20 DIAGNOSIS — W19XXXA Unspecified fall, initial encounter: Secondary | ICD-10-CM | POA: Diagnosis present

## 2015-06-20 DIAGNOSIS — I5032 Chronic diastolic (congestive) heart failure: Secondary | ICD-10-CM | POA: Diagnosis not present

## 2015-06-20 DIAGNOSIS — E860 Dehydration: Secondary | ICD-10-CM | POA: Diagnosis present

## 2015-06-20 DIAGNOSIS — I712 Thoracic aortic aneurysm, without rupture: Secondary | ICD-10-CM | POA: Diagnosis present

## 2015-06-20 DIAGNOSIS — Z8551 Personal history of malignant neoplasm of bladder: Secondary | ICD-10-CM

## 2015-06-20 DIAGNOSIS — Z7952 Long term (current) use of systemic steroids: Secondary | ICD-10-CM

## 2015-06-20 DIAGNOSIS — F0390 Unspecified dementia without behavioral disturbance: Secondary | ICD-10-CM | POA: Diagnosis present

## 2015-06-20 DIAGNOSIS — R410 Disorientation, unspecified: Secondary | ICD-10-CM | POA: Diagnosis not present

## 2015-06-20 DIAGNOSIS — Z7982 Long term (current) use of aspirin: Secondary | ICD-10-CM

## 2015-06-20 DIAGNOSIS — Z8249 Family history of ischemic heart disease and other diseases of the circulatory system: Secondary | ICD-10-CM

## 2015-06-20 DIAGNOSIS — R471 Dysarthria and anarthria: Secondary | ICD-10-CM | POA: Diagnosis not present

## 2015-06-20 DIAGNOSIS — Z8673 Personal history of transient ischemic attack (TIA), and cerebral infarction without residual deficits: Secondary | ICD-10-CM

## 2015-06-20 DIAGNOSIS — E785 Hyperlipidemia, unspecified: Secondary | ICD-10-CM | POA: Diagnosis present

## 2015-06-20 DIAGNOSIS — R4182 Altered mental status, unspecified: Secondary | ICD-10-CM

## 2015-06-20 DIAGNOSIS — R338 Other retention of urine: Secondary | ICD-10-CM | POA: Diagnosis present

## 2015-06-20 DIAGNOSIS — I69322 Dysarthria following cerebral infarction: Secondary | ICD-10-CM

## 2015-06-20 DIAGNOSIS — Z66 Do not resuscitate: Secondary | ICD-10-CM | POA: Diagnosis present

## 2015-06-20 DIAGNOSIS — R4781 Slurred speech: Secondary | ICD-10-CM | POA: Diagnosis present

## 2015-06-20 DIAGNOSIS — Z96651 Presence of right artificial knee joint: Secondary | ICD-10-CM | POA: Diagnosis present

## 2015-06-20 DIAGNOSIS — R911 Solitary pulmonary nodule: Secondary | ICD-10-CM | POA: Diagnosis present

## 2015-06-20 DIAGNOSIS — N179 Acute kidney failure, unspecified: Secondary | ICD-10-CM

## 2015-06-20 DIAGNOSIS — G35 Multiple sclerosis: Secondary | ICD-10-CM | POA: Diagnosis present

## 2015-06-20 DIAGNOSIS — F32A Depression, unspecified: Secondary | ICD-10-CM | POA: Diagnosis present

## 2015-06-20 DIAGNOSIS — G2581 Restless legs syndrome: Secondary | ICD-10-CM | POA: Diagnosis present

## 2015-06-20 DIAGNOSIS — Z823 Family history of stroke: Secondary | ICD-10-CM

## 2015-06-20 DIAGNOSIS — Z888 Allergy status to other drugs, medicaments and biological substances status: Secondary | ICD-10-CM

## 2015-06-20 DIAGNOSIS — R55 Syncope and collapse: Secondary | ICD-10-CM | POA: Insufficient documentation

## 2015-06-20 HISTORY — DX: Cerebral infarction, unspecified: I63.9

## 2015-06-20 LAB — CBC
HEMATOCRIT: 41.5 % (ref 39.0–52.0)
HEMOGLOBIN: 12.7 g/dL — AB (ref 13.0–17.0)
MCH: 29.3 pg (ref 26.0–34.0)
MCHC: 30.6 g/dL (ref 30.0–36.0)
MCV: 95.6 fL (ref 78.0–100.0)
Platelets: 319 10*3/uL (ref 150–400)
RBC: 4.34 MIL/uL (ref 4.22–5.81)
RDW: 14.4 % (ref 11.5–15.5)
WBC: 11.1 10*3/uL — AB (ref 4.0–10.5)

## 2015-06-20 LAB — DIFFERENTIAL
BASOS PCT: 1 %
Basophils Absolute: 0.1 10*3/uL (ref 0.0–0.1)
Eosinophils Absolute: 0.6 10*3/uL (ref 0.0–0.7)
Eosinophils Relative: 5 %
LYMPHS ABS: 2.5 10*3/uL (ref 0.7–4.0)
Lymphocytes Relative: 22 %
MONO ABS: 1 10*3/uL (ref 0.1–1.0)
MONOS PCT: 9 %
NEUTROS ABS: 7 10*3/uL (ref 1.7–7.7)
Neutrophils Relative %: 63 %

## 2015-06-20 LAB — COMPREHENSIVE METABOLIC PANEL
ALK PHOS: 72 U/L (ref 38–126)
ALT: 21 U/L (ref 17–63)
AST: 24 U/L (ref 15–41)
Albumin: 3.9 g/dL (ref 3.5–5.0)
Anion gap: 8 (ref 5–15)
BILIRUBIN TOTAL: 0.3 mg/dL (ref 0.3–1.2)
BUN: 28 mg/dL — AB (ref 6–20)
CALCIUM: 9.1 mg/dL (ref 8.9–10.3)
CHLORIDE: 105 mmol/L (ref 101–111)
CO2: 25 mmol/L (ref 22–32)
CREATININE: 1.64 mg/dL — AB (ref 0.61–1.24)
GFR, EST AFRICAN AMERICAN: 46 mL/min — AB (ref >60)
GFR, EST NON AFRICAN AMERICAN: 40 mL/min — AB (ref >60)
Glucose, Bld: 75 mg/dL (ref 65–99)
Potassium: 4.8 mmol/L (ref 3.5–5.1)
Sodium: 138 mmol/L (ref 135–145)
TOTAL PROTEIN: 6.7 g/dL (ref 6.5–8.1)

## 2015-06-20 LAB — I-STAT CHEM 8, ED
BUN: 34 mg/dL — AB (ref 6–20)
CALCIUM ION: 1.08 mmol/L — AB (ref 1.13–1.30)
CREATININE: 1.6 mg/dL — AB (ref 0.61–1.24)
Chloride: 103 mmol/L (ref 101–111)
GLUCOSE: 68 mg/dL (ref 65–99)
HCT: 42 % (ref 39.0–52.0)
HEMOGLOBIN: 14.3 g/dL (ref 13.0–17.0)
Potassium: 4.6 mmol/L (ref 3.5–5.1)
Sodium: 140 mmol/L (ref 135–145)
TCO2: 27 mmol/L (ref 0–100)

## 2015-06-20 LAB — URINE MICROSCOPIC-ADD ON
RBC / HPF: NONE SEEN RBC/hpf (ref 0–5)
Squamous Epithelial / LPF: NONE SEEN

## 2015-06-20 LAB — URINALYSIS, ROUTINE W REFLEX MICROSCOPIC
Bilirubin Urine: NEGATIVE
GLUCOSE, UA: NEGATIVE mg/dL
HGB URINE DIPSTICK: NEGATIVE
KETONES UR: NEGATIVE mg/dL
Nitrite: POSITIVE — AB
PH: 5 (ref 5.0–8.0)
PROTEIN: NEGATIVE mg/dL
Specific Gravity, Urine: 1.02 (ref 1.005–1.030)

## 2015-06-20 LAB — APTT: aPTT: 25 seconds (ref 24–37)

## 2015-06-20 LAB — I-STAT TROPONIN, ED: TROPONIN I, POC: 0 ng/mL (ref 0.00–0.08)

## 2015-06-20 LAB — GLUCOSE, FINGERSTICK (STAT): GLUCOSE, FINGERSTICK: 75 mg/dL (ref 70–99)

## 2015-06-20 LAB — PROTIME-INR
INR: 1.02 (ref 0.00–1.49)
Prothrombin Time: 13.6 seconds (ref 11.6–15.2)

## 2015-06-20 LAB — CBG MONITORING, ED: Glucose-Capillary: 80 mg/dL (ref 65–99)

## 2015-06-20 MED ORDER — HYDROCODONE-ACETAMINOPHEN 10-325 MG PO TABS: 1.0000 | ORAL_TABLET | Freq: Four times a day (QID) | ORAL | Status: DC | PRN

## 2015-06-20 MED ORDER — DEXTROSE 5 % IV SOLN
1.0000 g | INTRAVENOUS | Status: DC
  Filled 2015-06-20: qty 10

## 2015-06-20 MED ORDER — ONDANSETRON HCL 4 MG/2ML IJ SOLN: 4.0000 mg | Freq: Four times a day (QID) | INTRAMUSCULAR | Status: DC | PRN

## 2015-06-20 MED ORDER — ALBUTEROL SULFATE HFA 108 (90 BASE) MCG/ACT IN AERS: 1.0000 | INHALATION_SPRAY | Freq: Four times a day (QID) | RESPIRATORY_TRACT | Status: DC | PRN

## 2015-06-20 MED ORDER — GADOBENATE DIMEGLUMINE 529 MG/ML IV SOLN
20.0000 mL | Freq: Once | INTRAVENOUS | Status: AC
  Administered 2015-06-20: 20 mL via INTRAVENOUS

## 2015-06-20 MED ORDER — CEFTRIAXONE SODIUM 1 G IJ SOLR
1.0000 g | Freq: Once | INTRAMUSCULAR | Status: AC
  Administered 2015-06-20: 1 g via INTRAVENOUS
  Filled 2015-06-20: qty 10

## 2015-06-20 MED ORDER — SODIUM CHLORIDE 0.9 % IV BOLUS (SEPSIS)
500.0000 mL | Freq: Once | INTRAVENOUS | Status: AC
  Administered 2015-06-20: 500 mL via INTRAVENOUS

## 2015-06-20 MED ORDER — ENOXAPARIN SODIUM 40 MG/0.4ML ~~LOC~~ SOLN
40.0000 mg | SUBCUTANEOUS | Status: DC
  Administered 2015-06-20 – 2015-06-23 (×4): 40 mg via SUBCUTANEOUS
  Filled 2015-06-20 (×4): qty 0.4

## 2015-06-20 MED ORDER — SODIUM CHLORIDE 0.9 % IV BOLUS (SEPSIS)
1000.0000 mL | Freq: Once | INTRAVENOUS | Status: AC
  Administered 2015-06-20: 1000 mL via INTRAVENOUS

## 2015-06-20 MED ORDER — BISACODYL 5 MG PO TBEC: 5.0000 mg | DELAYED_RELEASE_TABLET | Freq: Every day | ORAL | Status: DC | PRN

## 2015-06-20 MED ORDER — PANTOPRAZOLE SODIUM 40 MG PO TBEC
40.0000 mg | DELAYED_RELEASE_TABLET | Freq: Every day | ORAL | Status: DC
  Administered 2015-06-21 – 2015-06-23 (×3): 40 mg via ORAL
  Filled 2015-06-20 (×3): qty 1

## 2015-06-20 MED ORDER — ROPINIROLE HCL 1 MG PO TABS
1.0000 mg | ORAL_TABLET | Freq: Every day | ORAL | Status: DC
  Administered 2015-06-20 – 2015-06-22 (×3): 1 mg via ORAL
  Filled 2015-06-20 (×3): qty 1

## 2015-06-20 MED ORDER — BISOPROLOL FUMARATE 5 MG PO TABS: 2.5000 mg | ORAL_TABLET | Freq: Every day | ORAL | Status: DC

## 2015-06-20 MED ORDER — AMLODIPINE BESYLATE 5 MG PO TABS: 5.0000 mg | ORAL_TABLET | Freq: Every day | ORAL | Status: DC

## 2015-06-20 MED ORDER — TRAZODONE HCL 50 MG PO TABS: 25.0000 mg | ORAL_TABLET | Freq: Every evening | ORAL | Status: DC | PRN

## 2015-06-20 MED ORDER — STROKE: EARLY STAGES OF RECOVERY BOOK
Freq: Once | Status: AC
  Administered 2015-06-20: 18:00:00
  Filled 2015-06-20: qty 1

## 2015-06-20 MED ORDER — VENLAFAXINE HCL ER 75 MG PO CP24
150.0000 mg | ORAL_CAPSULE | Freq: Two times a day (BID) | ORAL | Status: DC
  Administered 2015-06-21 – 2015-06-23 (×4): 150 mg via ORAL
  Filled 2015-06-20 (×5): qty 2

## 2015-06-20 MED ORDER — ARIPIPRAZOLE 10 MG PO TABS
5.0000 mg | ORAL_TABLET | Freq: Every day | ORAL | Status: DC
  Administered 2015-06-21 – 2015-06-23 (×3): 5 mg via ORAL
  Filled 2015-06-20 (×3): qty 1

## 2015-06-20 MED ORDER — ASPIRIN EC 81 MG PO TBEC: 81.0000 mg | DELAYED_RELEASE_TABLET | Freq: Every day | ORAL | Status: DC

## 2015-06-20 MED ORDER — ATORVASTATIN CALCIUM 10 MG PO TABS: 20.0000 mg | ORAL_TABLET | Freq: Every day | ORAL | Status: DC

## 2015-06-20 MED ORDER — VENLAFAXINE HCL ER 75 MG PO CP24: 150.0000 mg | ORAL_CAPSULE | Freq: Two times a day (BID) | ORAL | Status: DC

## 2015-06-20 MED ORDER — TIOTROPIUM BROMIDE MONOHYDRATE 18 MCG IN CAPS
18.0000 ug | ORAL_CAPSULE | Freq: Every day | RESPIRATORY_TRACT | Status: DC
  Administered 2015-06-21 – 2015-06-23 (×3): 18 ug via RESPIRATORY_TRACT
  Filled 2015-06-20 (×2): qty 5

## 2015-06-20 MED ORDER — POLYETHYLENE GLYCOL 3350 17 G PO PACK: 17.0000 g | PACK | Freq: Every day | ORAL | Status: DC | PRN

## 2015-06-20 MED ORDER — ACETAMINOPHEN 325 MG PO TABS
650.0000 mg | ORAL_TABLET | Freq: Four times a day (QID) | ORAL | Status: DC | PRN
  Administered 2015-06-21 – 2015-06-23 (×2): 650 mg via ORAL
  Filled 2015-06-20 (×2): qty 2

## 2015-06-20 MED ORDER — ATORVASTATIN CALCIUM 10 MG PO TABS
20.0000 mg | ORAL_TABLET | Freq: Every day | ORAL | Status: DC
  Administered 2015-06-20 – 2015-06-23 (×4): 20 mg via ORAL
  Filled 2015-06-20 (×4): qty 2

## 2015-06-20 MED ORDER — DEXTROSE 5 % IV SOLN
1.0000 g | INTRAVENOUS | Status: DC
  Administered 2015-06-21 – 2015-06-23 (×3): 1 g via INTRAVENOUS
  Filled 2015-06-20 (×3): qty 10

## 2015-06-20 MED ORDER — ACETAMINOPHEN 650 MG RE SUPP
650.0000 mg | Freq: Four times a day (QID) | RECTAL | Status: DC | PRN
  Administered 2015-06-22: 650 mg via RECTAL

## 2015-06-20 MED ORDER — ALBUTEROL SULFATE (2.5 MG/3ML) 0.083% IN NEBU: 2.5000 mg | INHALATION_SOLUTION | RESPIRATORY_TRACT | Status: DC | PRN

## 2015-06-20 MED ORDER — SODIUM CHLORIDE 0.9 % IV SOLN
INTRAVENOUS | Status: DC
  Administered 2015-06-20 – 2015-06-21 (×3): via INTRAVENOUS

## 2015-06-20 MED ORDER — ONDANSETRON HCL 4 MG PO TABS: 4.0000 mg | ORAL_TABLET | Freq: Four times a day (QID) | ORAL | Status: DC | PRN

## 2015-06-20 MED ORDER — HYDRALAZINE HCL 20 MG/ML IJ SOLN: 10.0000 mg | Freq: Three times a day (TID) | INTRAMUSCULAR | Status: DC | PRN

## 2015-06-20 NOTE — Progress Notes (Signed)
Subjective:    Patient ID: [name] [name], male    DOB: 03-09-1940, 75 y.o.   MRN: [id number]  HPI Patient was discharged from the hospital 24 hours ago having recently suffered an acute right MCA CVA.  Per his wife's report, his mental status is back to his baseline at discharge from the hospital. He was not slurring his speech. This morning, the patient became confused and wandered away from the home for 45 minutes. He was found walking on the side of the road by a passerby who saw him fall in a ditch. He is cool clammy and diaphoretic. He is extremely somnolent although easily aroused. In fact he falls asleep while we are performing his EKG both the patient and the wife deny any pain medication, or mood altering substances being taken this morning. Wife has been monitoring his medication and states that he is not in a properly taken his medication by accident. He is able to tell me the location and date but I constantly has to reorient him and wake him up. Pulse oximetry is slightly low. The remainder of his vital signs are normal. His blood sugar randomly checked is found to be 75. EKG shows no acute ST changes significant for ischemia or infarction. Past Medical History  Diagnosis Date  . MS (multiple sclerosis) ([place])   . Arthritis     s/p TKR  . Right bundle branch block   . Brugada syndrome     Possible Type II Brugada ECG pattern. No family history of SCD, no syncope, no tachypalpitations.  Marland Kitchen COPD (chronic obstructive pulmonary disease) ([place])     Prior heavy smoker. PFTs (3/11): FVC 87%, FEV1 73%, ratio 0.57, DLCO 75%, TLC 121%. Moderate obstructive defect. PFTs (9/15): Only minimal obstruction, sugPrior heavy smoker. PFTs (3/11): FVC 87%, FEV1 73%, ratio 0.57, DLCO 75%, TLC 121%. Moderate obstructive defect. PFTs (9/15) minimal obstruction, poss asthma component   . GERD (gastroesophageal reflux disease)   . Depression     with bipolar tendencies  . Urinary retention with incomplete  bladder emptying     receives botox injections and treatment for BPH through [facility]  . BPH (benign prostatic hyperplasia)   . CAD (coronary artery disease)     a. LHC 1/15 - mid LCx 50 and 60%, proximal RCA 50%  . HLD (hyperlipidemia)   . HTN (hypertension)   . Chronic diastolic CHF (congestive heart failure) ([place])   . LVH (left ventricular hypertrophy)     a. Echo 12/14 Inferior and distal septal HK, mild LVH, EF 50-55%, mild MR, mild LAE  . Bladder cancer (HCC)     [facility], Ta low grade papillary urothileal carcinoma  . DOE (dyspnea on exertion)     a. Myoview 8/09- EF 57%, no ischemia // b. Myoview (3/11) EF 68%, diaphragmatic attenuation, no ischemia //  c Echo (4/11) EF 0000000, mild diastolic dysfunction, PASP 38 mmHg // d. PFTs 9/15 minimal obstruction  /  e. CT negative for ILD  . Mitral regurgitation     Echo (4/11) with PISA ERO 0.3 cm^2 and regurgitant volume 41 mL (moderate MR). Echo (12/14) with mild MR.   Marland Kitchen History of Doppler ultrasound     Carotid US 3/11 negative for significant stenosis  . Mild dementia   . Low testosterone   . CKD (chronic kidney disease)   . Lung nodule     a. CT in 2015 >> PET in 12/15 not sugg of malignancy   . Thoracic  aortic aneurysm ([place])     4.1 cm 2017   Past Surgical History  Procedure Laterality Date  . Total knee arthroplasty Right   . Transurethral resection of prostate    . Tonsillectomy    . Appendectomy    . Rotator cuff repair    . Hemorrhoid surgery    . Left heart catheterization with coronary angiogram N/A 01/05/2013    Procedure: LEFT HEART CATHETERIZATION WITH CORONARY ANGIOGRAM;  Surgeon: Larey Dresser, MD;  Location: [facility] CATH LAB;  Service: Cardiovascular;  Laterality: N/A;   Current Outpatient Prescriptions on File Prior to Visit  Medication Sig Dispense Refill  . amLODipine (NORVASC) 5 MG tablet Take 5 mg by mouth daily.    . ARIPiprazole (ABILIFY) 5 MG tablet TAKE 1 TABLET DAILY 90 tablet 2  . aspirin EC 81 MG tablet  Take 1 tablet (81 mg total) by mouth daily. 30 tablet 0  . atorvastatin (LIPITOR) 20 MG tablet Take 20 mg by mouth daily.    . bisoprolol (ZEBETA) 5 MG tablet TAKE ONE-HALF (1/2) TABLET BY MOUTH ONCE  DAILY    . furosemide (LASIX) 40 MG tablet Take 1 tablet (40 mg total) by mouth daily as needed (swelling). Please keep upcoming appointment for further refills 180 tablet 0  . HYDROcodone-acetaminophen (NORCO) 10-325 MG tablet Take 1 tablet by mouth every 6 (six) hours as needed for moderate pain or severe pain. 30 tablet 0  . omeprazole (PRILOSEC) 20 MG capsule Take 1 capsule (20 mg total) by mouth daily. 90 capsule 3  . oxybutynin (DITROPAN-XL) 5 MG 24 hr tablet Take 5 mg by mouth at bedtime.    . predniSONE (DELTASONE) 10 MG tablet Take 10 mg by mouth daily as needed (FOR PAIN).    Marland Kitchen PROAIR HFA 108 (90 BASE) MCG/ACT inhaler USE 1 TO 2 INHALATIONS EVERY 6 HOURS AS NEEDED FOR WHEEZING OR SHORTNESS OF BREATH 25.5 g 4  . rOPINIRole (REQUIP) 1 MG tablet Take 1 mg by mouth at bedtime.     Marland Kitchen SPIRIVA HANDIHALER 18 MCG inhalation capsule INHALE THE CONTENTS OF 1 CAPSULE WITH 2         INHALATIONS ONCE DAILY AS DIRECTED 1 capsule 11  . venlafaxine XR (EFFEXOR-XR) 150 MG 24 hr capsule Take 150 mg by mouth 2 (two) times daily.     . vitamin B-12 (CYANOCOBALAMIN) 1000 MCG tablet Take 1,000 mcg by mouth daily.     No current facility-administered medications on file prior to visit.   Allergies  Allergen Reactions  . Acetylcholine     unknown  . Alcohol-Sulfur [Sulfur]     unknown  . Amitriptyline     unknown  . Bupivacaine     unknown  . Clomipramine Hcl     unknown  . Cocaine     unknown  . Desipramine     unknown  . Ergonovine     unknown  . Flecainide     unknown  . Lithium     unknown  . Loxapine     unknown  . Nortriptyline     unknown  . Oxcarbazepine     unknown  . Procainamide     unknown  . Procaine     unknwon  . Propafenone     unknown  . Propofol     unknown  .  Trifluoperazine     unknown   Social History   Social History  . Marital Status: Married    Spouse Name:  N/A  . Number of Children: N/A  . Years of Education: N/A   Occupational History  . retired    Social History Main Topics  . Smoking status: Former Smoker -- 2.00 packs/day for 40 years    Types: Cigarettes    Quit date: 01/04/1993  . Smokeless tobacco: Former Systems developer    Types: Chew  . Alcohol Use: Yes     Comment: 6 pack/day  . Drug Use: Not on file  . Sexual Activity: Not on file   Other Topics Concern  . Not on file   Social History Narrative   Retired from the [employer] after 20 years   [place] Veteran   .   Review of Systems  All other systems reviewed and are negative.      Objective:   Physical Exam  Constitutional: He is oriented to person, place, and time. He appears lethargic. He is easily aroused.  HENT:  Right Ear: External ear normal.  Left Ear: External ear normal.  Nose: Nose normal.  Mouth/Throat: Oropharynx is clear and moist. No oropharyngeal exudate.  Eyes: Conjunctivae are normal. No scleral icterus.  Neck: Neck supple. No JVD present.  Cardiovascular: Normal rate, regular rhythm and normal heart sounds.   Pulmonary/Chest: Effort normal and breath sounds normal. No respiratory distress. He has no wheezes. He has no rales.  Abdominal: Soft. Bowel sounds are normal.  Lymphadenopathy:    He has no cervical adenopathy.  Neurological: He is oriented to person, place, and time and easily aroused. He has normal strength. He appears lethargic. He displays no tremor. No cranial nerve deficit or sensory deficit. He exhibits normal muscle tone. He displays no seizure activity.  Skin: He is diaphoretic.    Cool clammy      Assessment & Plan:  Palpitations - Plan: EKG 12-Lead  Altered mental status, unspecified altered mental status type EKG is reassuring. Blood sugar is normal. The remainder of his vital signs are normal except for a  slightly low pulse oximetry. However the patient is extremely somnolent and lethargic. He falls asleep twice during our visit. I am concerned given his recent stroke about his altered mental status. He was also confused this morning and found staggering alongside the road. I believe he needs emergency room evaluation. Differential diagnosis would be hospital-acquired infection such as urinary tract infection, pneumonia. Also would be extension of the stroke, hemorrhagic conversion, mood altering substance use, accidental drug misuse due to confusion, etc.  I believe he needs urinalysis, chest x-ray, baseline labs, and a CT scan of the head performed immediately which cannot be done rapidly in my office. I believe it also benefit from an ammonia level as he does have a slight smell of fetor hepaticus.  However there is no jaundice. There is no asterixis on his exam and he has no history of liver problems.

## 2015-06-20 NOTE — ED Notes (Signed)
Dr. Oleta Mouse MD at bedside.

## 2015-06-20 NOTE — ED Notes (Signed)
Pt at MRI

## 2015-06-20 NOTE — ED Notes (Signed)
Pt is aware urine is needed, urinal at bedside.  

## 2015-06-20 NOTE — Consult Note (Signed)
NEURO HOSPITALIST CONSULT NOTE   Requestig physician: Dr. Oleta Mouse  Reason for Consult: New right MCA stroke  History obtained from:   Patient and Chart     HPI:                                                                                                                                          [name] [name] is an 75 y.o. male who presents with acute onset of diffuse weakness, dysarthria and gait instability. Per ED note: "75 year old male who presents with dysarthria and gait instability. He has a history of relapsing and remitting multiple sclerosis, COPD, hypertension and hyperlipidemia. He was discharged from the hospital 2 days ago after a right MCA stroke and was discharged with residual dysarthria and gait instability. States that he has been in his baseline health since discharge, this morning overexerted himself on a walk, his wife was called by a neighbor who noticed that he appeared very unwell after his walk. States that he had worsening of his dysarthria, difficulty walking, and appeared very short of breath. He denies any cough, chest pain, nausea or vomiting, diarrhea, fever, chills, dysuria or urinary difficulty. States that his symptoms currently are worse than when he left the hospital. He had a mechanical fall yesterday but did not hit his head."  Past Medical History  Diagnosis Date  . MS (multiple sclerosis) ([place])   . Arthritis     s/p TKR  . Right bundle branch block   . Brugada syndrome     Possible Type II Brugada ECG pattern. No family history of SCD, no syncope, no tachypalpitations.  Marland Kitchen COPD (chronic obstructive pulmonary disease) ([place])     Prior heavy smoker. PFTs (3/11): FVC 87%, FEV1 73%, ratio 0.57, DLCO 75%, TLC 121%. Moderate obstructive defect. PFTs (9/15): Only minimal obstruction, sugPrior heavy smoker. PFTs (3/11): FVC 87%, FEV1 73%, ratio 0.57, DLCO 75%, TLC 121%. Moderate obstructive defect. PFTs (9/15) minimal obstruction, poss asthma  component   . GERD (gastroesophageal reflux disease)   . Depression     with bipolar tendencies  . Urinary retention with incomplete bladder emptying     receives botox injections and treatment for BPH through [facility]  . BPH (benign prostatic hyperplasia)   . CAD (coronary artery disease)     a. LHC 1/15 - mid LCx 50 and 60%, proximal RCA 50%  . HLD (hyperlipidemia)   . HTN (hypertension)   . Chronic diastolic CHF (congestive heart failure) ([place])   . LVH (left ventricular hypertrophy)     a. Echo 12/14 Inferior and distal septal HK, mild LVH, EF 50-55%, mild MR, mild LAE  . Bladder cancer (HCC)     [facility], Ta low grade papillary urothileal carcinoma  . DOE (dyspnea on exertion)  a. Myoview 8/09- EF 57%, no ischemia // b. Myoview (3/11) EF 68%, diaphragmatic attenuation, no ischemia //  c Echo (4/11) EF 0000000, mild diastolic dysfunction, PASP 38 mmHg // d. PFTs 9/15 minimal obstruction  /  e. CT negative for ILD  . Mitral regurgitation     Echo (4/11) with PISA ERO 0.3 cm^2 and regurgitant volume 41 mL (moderate MR). Echo (12/14) with mild MR.   Marland Kitchen History of Doppler ultrasound     Carotid US 3/11 negative for significant stenosis  . Mild dementia   . Low testosterone   . CKD (chronic kidney disease)   . Lung nodule     a. CT in 2015 >> PET in 12/15 not sugg of malignancy   . Thoracic aortic aneurysm (HCC)     4.1 cm 2017  . Stroke [facility])     Past Surgical History  Procedure Laterality Date  . Total knee arthroplasty Right   . Transurethral resection of prostate    . Tonsillectomy    . Appendectomy    . Rotator cuff repair    . Hemorrhoid surgery    . Left heart catheterization with coronary angiogram N/A 01/05/2013    Procedure: LEFT HEART CATHETERIZATION WITH CORONARY ANGIOGRAM;  Surgeon: Larey Dresser, MD;  Location[facility] CATH LAB;  Service: Cardiovascular;  Laterality: N/A;    Family History  Problem Relation Age of Onset  . Stroke Mother   . Stroke Father   .  Hypertension Sister   . Kidney cancer Brother   . Arthritis Brother   . Other Brother     knee problems, Bil TKR  . Hypertension      family history     Social History:  reports that he quit smoking about 22 years ago. His smoking use included Cigarettes. He has a 80 pack-year smoking history. He has quit using smokeless tobacco. His smokeless tobacco use included Chew. He reports that he does not drink alcohol or use illicit drugs.  Allergies  Allergen Reactions  . Acetylcholine     unknown  . Alcohol-Sulfur [Sulfur]     unknown  . Amitriptyline     unknown  . Bupivacaine     unknown  . Clomipramine Hcl     unknown  . Cocaine     unknown  . Desipramine     unknown  . Ergonovine     unknown  . Flecainide     unknown  . Lithium     unknown  . Loxapine     unknown  . Nortriptyline     unknown  . Oxcarbazepine     unknown  . Procainamide     unknown  . Procaine     unknwon  . Propafenone     unknown  . Propofol     unknown  . Trifluoperazine     unknown    MEDICATIONS:  No current facility-administered medications on file prior to encounter.   Current Outpatient Prescriptions on File Prior to Encounter  Medication Sig Dispense Refill  . amLODipine (NORVASC) 5 MG tablet Take 5 mg by mouth daily.    . ARIPiprazole (ABILIFY) 5 MG tablet TAKE 1 TABLET DAILY 90 tablet 2  . aspirin EC 81 MG tablet Take 1 tablet (81 mg total) by mouth daily. 30 tablet 0  . atorvastatin (LIPITOR) 20 MG tablet Take 20 mg by mouth daily.    . bisoprolol (ZEBETA) 5 MG tablet TAKE ONE-HALF (1/2) TABLET BY MOUTH ONCE  DAILY    . furosemide (LASIX) 40 MG tablet Take 1 tablet (40 mg total) by mouth daily as needed (swelling). Please keep upcoming appointment for further refills 180 tablet 0  . HYDROcodone-acetaminophen (NORCO) 10-325 MG tablet Take 1 tablet by mouth every 6  (six) hours as needed for moderate pain or severe pain. 30 tablet 0  . omeprazole (PRILOSEC) 20 MG capsule Take 1 capsule (20 mg total) by mouth daily. 90 capsule 3  . oxybutynin (DITROPAN-XL) 5 MG 24 hr tablet Take 5 mg by mouth at bedtime.    . predniSONE (DELTASONE) 10 MG tablet Take 10 mg by mouth daily as needed (FOR PAIN).    Marland Kitchen PROAIR HFA 108 (90 BASE) MCG/ACT inhaler USE 1 TO 2 INHALATIONS EVERY 6 HOURS AS NEEDED FOR WHEEZING OR SHORTNESS OF BREATH 25.5 g 4  . rOPINIRole (REQUIP) 1 MG tablet Take 1 mg by mouth at bedtime.     Marland Kitchen SPIRIVA HANDIHALER 18 MCG inhalation capsule INHALE THE CONTENTS OF 1 CAPSULE WITH 2         INHALATIONS ONCE DAILY AS DIRECTED 1 capsule 11  . venlafaxine XR (EFFEXOR-XR) 150 MG 24 hr capsule Take 150 mg by mouth 2 (two) times daily.     . vitamin B-12 (CYANOCOBALAMIN) 1000 MCG tablet Take 1,000 mcg by mouth daily.        ROS:                                                                                                                                       History obtained from patient and wife. Diffuse hyperhidrosis and sensation of hyperthermia prior to admission, now improved. Does not endorse symptoms of infection.   Blood pressure 132/64, pulse 77, resp. rate 15, height 6' (1.829 m), weight 99.791 kg (220 lb), SpO2 94 %.   Neurologic Examination:  HEENT-  Normocephalic, no lesions, without obvious abnormality.  Normal external eye and conjunctiva.   Lungs- No gross wheezes Extremities- No edema   Neurological Examination Mental Status: Alert, oriented, thought content appropriate.  Speech fluent without evidence of aphasia.  Able to follow all commands without difficulty. Prosodic content of speech is normal.  Cranial Nerves: II: Visual fields grossly normal, pupils equal, round, reactive to light and accommodation III,IV, VI: ptosis not present,  extra-ocular motions intact bilaterally. Saccadic visual pursuits are noted, slightly more prominent when tracking towards the left.  V,VII: Smile symmetric, facial temperature sensation normal bilaterally VIII: hearing intact to conversation IX,X: uvula rises symmetrically XI: symmetric XII: midline tongue extension Motor: Right : Upper extremity   5/5    Left:     Upper extremity   5/5  Lower extremity   5/5     Lower extremity   5/5 Tone and bulk:normal tone throughout; no atrophy noted. Subtle left pronator drift noted.  Subtle dystonic posturing of left wrist and ankle when at rest.  Subtle decreased spontaneous movements of LUE relative to right when gesturing or manipulating objects.  Sensory: Temperature and light touch intact throughout, bilaterally, without extinction Deep Tendon Reflexes: 2+ and symmetric throughout  Plantars: Right: downgoing   Left: downgoing Cerebellar: No ataxia on FNF. Subtly slower and more tentative on left than on right.  Gait: Deferred.   Lab Results: Basic Metabolic Panel:  Recent Labs Lab 06/15/15 1010 06/17/15 0347 06/20/15 0855 06/20/15 0940 06/20/15 1033  NA 137 135  --  138 140  K 4.6 3.9  --  4.8 4.6  CL 106 101  --  105 103  CO2 21* 25  --  25  --   GLUCOSE 119* 103* 75 75 68  BUN 25* 19  --  28* 34*  CREATININE 1.20 1.06  --  1.64* 1.60*  CALCIUM 9.1 9.3  --  9.1  --     Liver Function Tests:  Recent Labs Lab 06/15/15 1010 06/20/15 0940  AST 24 24  ALT 23 21  ALKPHOS 72 72  BILITOT 0.6 0.3  PROT 7.2 6.7  ALBUMIN 4.0 3.9   No results for input(s): LIPASE, AMYLASE in the last 168 hours.  Recent Labs Lab 06/15/15 1037  AMMONIA 38*    CBC:  Recent Labs Lab 06/15/15 1010 06/20/15 0940 06/20/15 1033  WBC 9.3 11.1*  --   NEUTROABS 5.2 7.0  --   HGB 14.3 12.7* 14.3  HCT 45.4 41.5 42.0  MCV 94.8 95.6  --   PLT 373 319  --     Cardiac Enzymes: No results for input(s): CKTOTAL, CKMB, CKMBINDEX, TROPONINI in  the last 168 hours.  Lipid Panel:  Recent Labs Lab 06/16/15 0604  CHOL 185  TRIG 256*  HDL 43  CHOLHDL 4.3  VLDL 51*  LDLCALC 91    CBG:  Recent Labs Lab 06/15/15 0956 06/20/15 1023  GLUCAP 123* 8    Microbiology: Results for orders placed or performed in visit on 07/04/13  Urine culture     Status: None   Collection Time: 07/04/13 11:27 AM  Result Value Ref Range Status   Culture ESCHERICHIA COLI  Final   Colony Count 25,000 COLONIES/ML  Final   Organism ID, Bacteria ESCHERICHIA COLI  Final    Comment: Confirmed Extended Spectrum Beta-Lactamase Producer (ESBL)      Susceptibility   Escherichia coli -  (no method available)    AMPICILLIN >=32 Resistant  AMOX/CLAVULANIC 16 Intermediate     AMPICILLIN/SULBACTAM >=32 Resistant     PIP/TAZO <=4 Sensitive     IMIPENEM <=0.25 Sensitive     CEFAZOLIN >=64 Resistant     CEFTRIAXONE >=64 Resistant     CEFTAZIDIME  Resistant     CEFEPIME  Resistant     GENTAMICIN <=1 Sensitive     TOBRAMYCIN <=1 Sensitive     CIPROFLOXACIN >=4 Resistant     LEVOFLOXACIN >=8 Resistant     NITROFURANTOIN <=16 Sensitive     TRIMETH/SULFA >=320 Resistant     Coagulation Studies:  Recent Labs  06/20/15 0940  LABPROT 13.6  INR 1.02    Imaging: Dg Chest 2 View  06/20/2015  CLINICAL DATA:  Altered mental status EXAM: CHEST  2 VIEW COMPARISON:  06/05/2015 FINDINGS: Cardiac shadow is within normal limits. The lungs demonstrate minimal platelike atelectasis on the left. The previously seen nodules in the right lung base are not well appreciated on this exam. No effusion is noted. No acute bony abnormality is seen. IMPRESSION: Stable changes from recent CT.  No acute abnormality noted. Electronically Signed   By: Inez Catalina M.D.   On: 06/20/2015 11:03   Ct Head Wo Contrast  06/20/2015  CLINICAL DATA:  Slurred speech and left-sided facial droop EXAM: CT HEAD WITHOUT CONTRAST TECHNIQUE: Contiguous axial images were obtained from the  base of the skull through the vertex without intravenous contrast. COMPARISON:  06/16/2015 FINDINGS: Bony calvarium is intact. Mild atrophic changes are noted. Areas of decreased attenuation are noted consistent with chronic white matter ischemic change. The known right middle cerebral artery infarct changes are again noted and stable. No findings to suggest acute hemorrhage, acute infarction or space-occupying mass lesion is seen. Note is made of branching air within the right parotid gland this is likely related to intravenous air from IV placement. IMPRESSION: Stable atrophic and ischemic changes. No acute abnormality is noted. Electronically Signed   By: Inez Catalina M.D.   On: 06/20/2015 10:53   Mr Jeri Cos [id number] Contrast  06/20/2015  CLINICAL DATA:  Recent stroke.  Increasing confusion. EXAM: MRI HEAD WITHOUT AND WITH CONTRAST TECHNIQUE: Multiplanar, multiecho pulse sequences of the brain and surrounding structures were obtained without and with intravenous contrast. CONTRAST:  64mL MULTIHANCE GADOBENATE DIMEGLUMINE 529 MG/ML IV SOLN COMPARISON:  MRI brain 06/15/2015.  CT head earlier today. FINDINGS: There is a subcentimeter focus of restricted diffusion in the superior RIGHT insula, see image 28 series 4 and image 21 series 7, not present previously although some insular ischemia was present on 06/15/15. This represents slight progression of acute RIGHT MCA infarction. Otherwise, no other significant changes. No hemorrhagic transformation, significant mass effect, or proximal vascular occlusion. Baseline atrophy and small vessel disease is stable. Post infusion, mild intravascular and parenchymal enhancement within the areas of ischemia characterizing the subacute time course are evident. IMPRESSION: Subcentimeter focus of restricted diffusion in the superior RIGHT insula not present previously, representing progression of ischemia. Otherwise stable multifocal areas of acute RIGHT MCA infarction. No interval  development of large vessel occlusion, or significant mass effect. Mild postcontrast enhancement, consistent with the subacute time course. Electronically Signed   By: Staci Righter M.D.   On: 06/20/2015 14:53    Assessment: 1. New right MCA distribution small cortically based ischemic stroke. Right MCA infarction diagnosed at prior visit exhibits interval changes on MRI consistent with progression from acute to subacute stage.  2. His presentation is not consistent with an MS  exacerbation. I have informed the patient and his wife of this.  3. CAD. 4. CHF.  5. CKD.   Recommendations: 1. MRA of head to assess for possible interval change.  2. Cardiac telemetry. If atrial fibrillation not detected this admission, will need outpatient Holter monitoring, followed possibly by more long term monitoring with a loop recorder. The patient states that he wishes to proceed with this plan.  3. Increase atorvastatin to 40 mg po qd. Obtain CK level and LFTs.  4. Add Plavix to ASA as he has failed antiplatelet monotherapy.  5. Carotid ultrasound.  6. TEE.  7. PT/OT/Speech.  8. BP management.  9. Gentle rehydration.   Electronically signed: Dr. Kerney Elbe 06/20/2015, 3:47 PM

## 2015-06-20 NOTE — Addendum Note (Signed)
Addended by: Shary Decamp B on: 06/20/2015 08:49 AM   Modules accepted: Orders

## 2015-06-20 NOTE — H&P (Signed)
History and Physical    [name] [name] [id number] DOB: 10-11-1940 DOA: 06/20/2015  PCP: [name], MD  Patient coming from:   home      Chief Complaint: slurred speech, weakness  HPI: [name] [name] is a 75 y.o. male with medical history significant for CAD, chronic diastolic CHF,  COPD . Patient was hospitalized a few days ago with an acute right MCA stroke. He was discharged home with plans for outpatient PT,  outpatient TEE and possibly Holter monitor. Patient did well for the two days he was home. This morning he took dog for a walk. A neighbor called the wife to report that she had found patient sitting on the ground in the neighborhood. Patient states he tried to walk too far, became fatigued and had to sit down. When patient's wife arrived she found him with worsening slurred speech and worsening dysarthria. Patient saw PCP this a.m., he was transported by EMS from there to the ED.   Since receiving a liter of fluid his neurologic symptoms have already improved though he is not back to baseline per wife.   ED Course:  Afebrile, hemodynamically stable. Normal oxygen saturation. WBC 11.1, Cr 28, 1.64 No acute changes on head CT Urinalysis: Many bacteria, positive nitrites, wbc too numerous to count EKG Sinus rhythm IVCD, consider atypical RBBB Borderline ST elevation, anterior leads  Review of Systems: + for chronic constipation. All other ROS negative    Past Medical History  Diagnosis Date  . MS (multiple sclerosis) ([place])   . Arthritis     s/p TKR  . Right bundle branch block   . Brugada syndrome     Possible Type II Brugada ECG pattern. No family history of SCD, no syncope, no tachypalpitations.  Marland Kitchen COPD (chronic obstructive pulmonary disease) ([place])     Prior heavy smoker. PFTs (3/11): FVC 87%, FEV1 73%, ratio 0.57, DLCO 75%, TLC 121%. Moderate obstructive defect. PFTs (9/15): Only minimal obstruction, sugPrior heavy smoker. PFTs (3/11): FVC 87%, FEV1 73%,  ratio 0.57, DLCO 75%, TLC 121%. Moderate obstructive defect. PFTs (9/15) minimal obstruction, poss asthma component   . GERD (gastroesophageal reflux disease)   . Depression     with bipolar tendencies  . Urinary retention with incomplete bladder emptying     receives botox injections and treatment for BPH through [facility]  . BPH (benign prostatic hyperplasia)   . CAD (coronary artery disease)     a. LHC 1/15 - mid LCx 50 and 60%, proximal RCA 50%  . HLD (hyperlipidemia)   . HTN (hypertension)   . Chronic diastolic CHF (congestive heart failure) ([place])   . LVH (left ventricular hypertrophy)     a. Echo 12/14 Inferior and distal septal HK, mild LVH, EF 50-55%, mild MR, mild LAE  . Bladder cancer (HCC)     [facility], [facility] low grade papillary urothileal carcinoma  . DOE (dyspnea on exertion)     a. Myoview 8/09- EF 57%, no ischemia // b. Myoview (3/11) EF 68%, diaphragmatic attenuation, no ischemia //  c Echo (4/11) EF 0000000, mild diastolic dysfunction, PASP 38 mmHg // d. PFTs 9/15 minimal obstruction  /  e. CT negative for ILD  . Mitral regurgitation     Echo (4/11) with PISA ERO 0.3 cm^2 and regurgitant volume 41 mL (moderate MR). Echo (12/14) with mild MR.   Marland Kitchen History of Doppler ultrasound     Carotid US 3/11 negative for significant stenosis  . Mild dementia   . Low  testosterone   . CKD (chronic kidney disease)   . Lung nodule     a. CT in 2015 >> PET in 12/15 not sugg of malignancy   . Thoracic aortic aneurysm (HCC)     4.1 cm 2017  . Stroke [facility])     Past Surgical History  Procedure Laterality Date  . Total knee arthroplasty Right   . Transurethral resection of prostate    . Tonsillectomy    . Appendectomy    . Rotator cuff repair    . Hemorrhoid surgery    . Left heart catheterization with coronary angiogram N/A 01/05/2013    Procedure: LEFT HEART CATHETERIZATION WITH CORONARY ANGIOGRAM;  Surgeon: Larey Dresser, MD;  Location: [facility] CATH LAB;  Service: Cardiovascular;  Laterality:  N/A;    Social History   Social History  . Marital Status: Married    Spouse Name: N/A  . Number of Children: N/A  . Years of Education: N/A   Occupational History  . retired    Social History Main Topics  . Smoking status: Former Smoker -- 2.00 packs/day for 40 years    Types: Cigarettes    Quit date: 01/04/1993  . Smokeless tobacco: Former Systems developer    Types: Chew  . Alcohol Use: No     Comment: 6 pack/day - 06/20/15 Pt states wuit drinking in the 1970's  . Drug Use: No  . Sexual Activity: Not on file   Other Topics Concern  . Not on file   Social History Narrative   Retired from the [employer] after 20 years   [place] Veteran   Lives at home with wife. No assistive devices needed for ambulation Allergies  Allergen Reactions  . Acetylcholine     unknown  . Alcohol-Sulfur [Sulfur]     unknown  . Amitriptyline     unknown  . Bupivacaine     unknown  . Clomipramine Hcl     unknown  . Cocaine     unknown  . Desipramine     unknown  . Ergonovine     unknown  . Flecainide     unknown  . Lithium     unknown  . Loxapine     unknown  . Nortriptyline     unknown  . Oxcarbazepine     unknown  . Procainamide     unknown  . Procaine     unknwon  . Propafenone     unknown  . Propofol     unknown  . Trifluoperazine     unknown    Family History  Problem Relation Age of Onset  . Stroke Mother   . Stroke Father   . Hypertension Sister   . Kidney cancer Brother   . Arthritis Brother   . Other Brother     knee problems, Bil TKR  . Hypertension      family history    Prior to Admission medications   Medication Sig Start Date End Date Taking? Authorizing Provider  amLODipine (NORVASC) 5 MG tablet Take 5 mg by mouth daily.   Yes Historical Provider, MD  ARIPiprazole (ABILIFY) 5 MG tablet TAKE 1 TABLET DAILY 06/18/15  Yes [name], MD  aspirin EC 81 MG tablet Take 1 tablet (81 mg total) by mouth daily. 06/18/15  Yes Mauricio Gerome Apley, MD    atorvastatin (LIPITOR) 20 MG tablet Take 20 mg by mouth daily.   Yes Historical Provider, MD  bisoprolol (ZEBETA) 5 MG tablet TAKE  ONE-HALF (1/2) TABLET BY MOUTH ONCE  DAILY   Yes Historical Provider, MD  furosemide (LASIX) 40 MG tablet Take 1 tablet (40 mg total) by mouth daily as needed (swelling). Please keep upcoming appointment for further refills 06/18/15  Yes Mauricio Gerome Apley, MD  HYDROcodone-acetaminophen [facility]) 10-325 MG tablet Take 1 tablet by mouth every 6 (six) hours as needed for moderate pain or severe pain. 06/09/15  Yes Alycia Rossetti, MD  omeprazole (PRILOSEC) 20 MG capsule Take 1 capsule (20 mg total) by mouth daily. 06/05/15  Yes Scott Joylene Draft, PA-C  oxybutynin (DITROPAN-XL) 5 MG 24 hr tablet Take 5 mg by mouth at bedtime.   Yes Historical Provider, MD  predniSONE (DELTASONE) 10 MG tablet Take 10 mg by mouth daily as needed (FOR PAIN).   Yes Historical Provider, MD  PROAIR HFA 108 (90 BASE) MCG/ACT inhaler USE 1 TO 2 INHALATIONS EVERY 6 HOURS AS NEEDED FOR WHEEZING OR SHORTNESS OF BREATH 10/07/14  Yes [name], MD  rOPINIRole (REQUIP) 1 MG tablet Take 1 mg by mouth at bedtime.    Yes Historical Provider, MD  SPIRIVA HANDIHALER 18 MCG inhalation capsule INHALE THE CONTENTS OF 1 CAPSULE WITH 2         INHALATIONS ONCE DAILY AS DIRECTED   Yes [name], MD  venlafaxine XR (EFFEXOR-XR) 150 MG 24 hr capsule Take 150 mg by mouth 2 (two) times daily.    Yes Historical Provider, MD  vitamin B-12 (CYANOCOBALAMIN) 1000 MCG tablet Take 1,000 mcg by mouth daily.   Yes Historical Provider, MD    Physical Exam: Filed Vitals:   06/20/15 1315 06/20/15 1448 06/20/15 1456 06/20/15 1515  BP: 127/70 128/62  132/64  Pulse: 71  75 77  Resp: 12  16 15   Height:      Weight:      SpO2: 97%  96% 94%    Constitutional:  Pleasant well developed white male in NAD, calm, comfortable Filed Vitals:   06/20/15 1315 06/20/15 1448 06/20/15 1456 06/20/15 1515  BP: 127/70 128/62   132/64  Pulse: 71  75 77  Resp: 12  16 15   Height:      Weight:      SpO2: 97%  96% 94%   Eyes: PER, lids and conjunctivae normal ENMT: Mucous membranes are DRY. Posterior pharynx clear of any exudate or lesions.Normal dentition.  Neck: normal, supple, no masses Respiratory: A few bibasilar inspiratory crackles . No wheezing, no crackles. Normal respiratory effort. No accessory muscle use.  Cardiovascular: Regular rate and rhythm, no murmurs / rubs / gallops. No extremity edema. 2+ pedal pulses.   Abdomen: no tenderness, no masses palpated. No hepatomegaly. Bowel sounds positive.  Musculoskeletal: no clubbing / cyanosis. No joint deformity upper and lower extremities. Good ROM, no contractures. Normal muscle tone.  Skin: no rashes, lesions, ulcers.  Neurologic:Alert and oriented. Answers questions appropriately. Speech is slurred, normal judgment and insight. Alert and oriented x 3. Normal mood.    Labs on Admission: I have personally reviewed following labs and imaging studies   Urine analysis:    Component Value Date/Time   COLORURINE YELLOW 06/20/2015 1140   APPEARANCEUR CLOUDY* 06/20/2015 1140   LABSPEC 1.020 06/20/2015 1140   PHURINE 5.0 06/20/2015 1140   GLUCOSEU NEGATIVE 06/20/2015 1140   HGBUR NEGATIVE 06/20/2015 1140   BILIRUBINUR NEGATIVE 06/20/2015 1140   KETONESUR NEGATIVE 06/20/2015 1140   PROTEINUR NEGATIVE 06/20/2015 1140   UROBILINOGEN 0.2 07/04/2013 1050   NITRITE POSITIVE*  06/20/2015 1140   LEUKOCYTESUR MODERATE* 06/20/2015 1140    Radiological Exams on Admission: Dg Chest 2 View  06/20/2015  CLINICAL DATA:  Altered mental status EXAM: CHEST  2 VIEW COMPARISON:  06/05/2015 FINDINGS: Cardiac shadow is within normal limits. The lungs demonstrate minimal platelike atelectasis on the left. The previously seen nodules in the right lung base are not well appreciated on this exam. No effusion is noted. No acute bony abnormality is seen. IMPRESSION: Stable changes  from recent CT.  No acute abnormality noted. Electronically Signed   By: Inez Catalina M.D.   On: 06/20/2015 11:03   Ct Head Wo Contrast  06/20/2015  CLINICAL DATA:  Slurred speech and left-sided facial droop EXAM: CT HEAD WITHOUT CONTRAST TECHNIQUE: Contiguous axial images were obtained from the base of the skull through the vertex without intravenous contrast. COMPARISON:  06/16/2015 FINDINGS: Bony calvarium is intact. Mild atrophic changes are noted. Areas of decreased attenuation are noted consistent with chronic white matter ischemic change. The known right middle cerebral artery infarct changes are again noted and stable. No findings to suggest acute hemorrhage, acute infarction or space-occupying mass lesion is seen. Note is made of branching air within the right parotid gland this is likely related to intravenous air from IV placement. IMPRESSION: Stable atrophic and ischemic changes. No acute abnormality is noted. Electronically Signed   By: Inez Catalina M.D.   On: 06/20/2015 10:53   Mr Jeri Cos [id number] Contrast  06/20/2015  CLINICAL DATA:  Recent stroke.  Increasing confusion. EXAM: MRI HEAD WITHOUT AND WITH CONTRAST TECHNIQUE: Multiplanar, multiecho pulse sequences of the brain and surrounding structures were obtained without and with intravenous contrast. CONTRAST:  66mL MULTIHANCE GADOBENATE DIMEGLUMINE 529 MG/ML IV SOLN COMPARISON:  MRI brain 06/15/2015.  CT head earlier today. FINDINGS: There is a subcentimeter focus of restricted diffusion in the superior RIGHT insula, see image 28 series 4 and image 21 series 7, not present previously although some insular ischemia was present on 06/15/15. This represents slight progression of acute RIGHT MCA infarction. Otherwise, no other significant changes. No hemorrhagic transformation, significant mass effect, or proximal vascular occlusion. Baseline atrophy and small vessel disease is stable. Post infusion, mild intravascular and parenchymal enhancement within  the areas of ischemia characterizing the subacute time course are evident. IMPRESSION: Subcentimeter focus of restricted diffusion in the superior RIGHT insula not present previously, representing progression of ischemia. Otherwise stable multifocal areas of acute RIGHT MCA infarction. No interval development of large vessel occlusion, or significant mass effect. Mild postcontrast enhancement, consistent with the subacute time course. Electronically Signed   By: Staci Righter M.D.   On: 06/20/2015 14:53    EKG: Independently reviewed.   EKG Interpretation  Date/Time:  Friday June 20 2015 09:37:34 EDT Ventricular Rate:  75 PR Interval:  180 QRS Duration: 120 QT Interval:  388 QTC Calculation: 433 R Axis:   -27 Text Interpretation:  Sinus rhythm IVCD, consider atypical RBBB Borderline ST elevation, anterior leads No acute changes compared to EKG 06/15/2015 Confirmed by LIU MD, DANA [id number]) on 06/20/2015 10:21:44 AM       Assessment/Plan   Stroke like symptoms.  He is s/p recent right MCA stroke (embolic?) with residual slurred speech at baseline. Today with worsening slurred speech / fatigue / dysarthria possibly secondary to dehydration and / or UTI vrs progression of stroke. Slurred speech improved after liter of fluid though not back to baseline -EDP conferred over phone with Neurology. MRI brain w and wo contrast  ordered. -Dry mucous membranes, give an additional 555ml bolus then start maintenance IVF while  awaiting MRI.  -Passed RN swallow evaluation, hungry. Will give cardiac diet -Discharge home vrs place in OBS pending clinical course / MRI   Dehydation / UTI / AKI. Baseline creatinine 1.06, up to 1.6 today, likely secondary to volume depletion -IV hydration -am bmet -hold lasix until renal function improves -hold ditropan -urine sent for culture -Continue Rocephin  Multiple sclerosis ([place]). No flares in a few years per wife. Not on treatment. -Continue Requip for restless  legs  Essential hypertension, controlled. Patient has already taken home am BP meds.    Chronic diastolic CHF (congestive heart failure). Grade I diastolic dysfunction. LVEF 60-65% on echo 3 days ago .  No evidence for volume overload.  -Needs IVF for dehydration -monitor daily wts and I+0 -continue beta blocker, BP meds, asa, statin  COPD (chronic obstructive pulmonary disease) ([place]). Stable.  -continue home proventil and spriva    Hyperlipidemia -continue home statin  Gastroesophageal reflux disease  -continue home PPI  Dehydration with acute kidney injury, dry mucous membranes. . -Received a liter of normal saline, will bolus with another 500 cc and start maintenance fluids -Hold home Lasix  COPD, stable. . -continue home Proventilair, [place]. . Stable           -Continue home effexor, abilify   DVT prophylaxis:   Lovenox Code Status:     DNR Family Communication:   Spoke with Wife in room and she understands and agrees with plan of treatment.   Disposition Plan: Discharge home in 24-48 hours              Consults called:    EDP spoke with Neurology over phone. No need for them to see if MRI doesn't show any acute abnormalities.  Admission status:  Observation - telemetry   Tye Savoy NP [facility] Pager [phone]  If 7PM-7AM, please contact night-coverage [email or web address] Password [email or web address]  06/20/2015, 3:33 PM   Addendum 3:50pm:  MRI shows progression of CVA. Called neurology, scan reviewed over the telephone. Cursory review of the scans suggests this is cardioembolic.  -place in OBS - tele -CVA order set utilized -Recent hemoglobin A1c 6.4 -Obtain lipid panel -Neurology to see the patient in consultation.  -Stat carotid ultrasound ordered (no previous carotid study) -Hold antihypertensives until okay with Neuro.  -Hydralazine for SBP 210 or greater or DBP 110 or greater

## 2015-06-20 NOTE — ED Notes (Signed)
Dr. Merrell MD at bedside. 

## 2015-06-20 NOTE — ED Provider Notes (Signed)
CSN: [id number]     Arrival date & time 06/20/15  0931 History   First MD Initiated Contact with Patient 06/20/15 [phone]     Chief Complaint  Patient presents with  . Aphasia  . Gait Problem     (Consider location/radiation/quality/duration/timing/severity/associated sxs/prior Treatment) HPI 75 year old male who presents with dysarthria and gait instability. He has a history of relapsing and remitting multiple sclerosis, COPD, hypertension and hyperlipidemia. He was discharged from the hospital 2 days ago after a right MCA stroke and was discharged with residual dysarthria and gait instability. States that he has been in his baseline health since discharge, this morning overexerted himself on a walk, his wife was called by a neighbor who noticed that he appeared very unwell after his walk. States that he had worsening of his dysarthria, difficulty walking, and appeared very short of breath. He denies any cough, chest pain, nausea or vomiting, diarrhea, fever, chills, dysuria or urinary difficulty. States that his symptoms currently are worse than when he left the hospital. He had a mechanical fall yesterday but did not hit his head. Past Medical History  Diagnosis Date  . MS (multiple sclerosis) ([place])   . Arthritis     s/p TKR  . Right bundle branch block   . Brugada syndrome     Possible Type II Brugada ECG pattern. No family history of SCD, no syncope, no tachypalpitations.  Marland Kitchen COPD (chronic obstructive pulmonary disease) ([place])     Prior heavy smoker. PFTs (3/11): FVC 87%, FEV1 73%, ratio 0.57, DLCO 75%, TLC 121%. Moderate obstructive defect. PFTs (9/15): Only minimal obstruction, sugPrior heavy smoker. PFTs (3/11): FVC 87%, FEV1 73%, ratio 0.57, DLCO 75%, TLC 121%. Moderate obstructive defect. PFTs (9/15) minimal obstruction, poss asthma component   . GERD (gastroesophageal reflux disease)   . Depression     with bipolar tendencies  . Urinary retention with incomplete bladder emptying      receives botox injections and treatment for BPH through [facility]  . BPH (benign prostatic hyperplasia)   . CAD (coronary artery disease)     a. LHC 1/15 - mid LCx 50 and 60%, proximal RCA 50%  . HLD (hyperlipidemia)   . HTN (hypertension)   . Chronic diastolic CHF (congestive heart failure) ([place])   . LVH (left ventricular hypertrophy)     a. Echo 12/14 Inferior and distal septal HK, mild LVH, EF 50-55%, mild MR, mild LAE  . Bladder cancer (HCC)     [facility], Ta low grade papillary urothileal carcinoma  . DOE (dyspnea on exertion)     a. Myoview 8/09- EF 57%, no ischemia // b. Myoview (3/11) EF 68%, diaphragmatic attenuation, no ischemia //  c Echo (4/11) EF 0000000, mild diastolic dysfunction, PASP 38 mmHg // d. PFTs 9/15 minimal obstruction  /  e. CT negative for ILD  . Mitral regurgitation     Echo (4/11) with PISA ERO 0.3 cm^2 and regurgitant volume 41 mL (moderate MR). Echo (12/14) with mild MR.   Marland Kitchen History of Doppler ultrasound     Carotid US 3/11 negative for significant stenosis  . Mild dementia   . Low testosterone   . CKD (chronic kidney disease)   . Lung nodule     a. CT in 2015 >> PET in 12/15 not sugg of malignancy   . Thoracic aortic aneurysm (HCC)     4.1 cm 2017  . Stroke [facility])    Past Surgical History  Procedure Laterality Date  . Total knee arthroplasty  Right   . Transurethral resection of prostate    . Tonsillectomy    . Appendectomy    . Rotator cuff repair    . Hemorrhoid surgery    . Left heart catheterization with coronary angiogram N/A 01/05/2013    Procedure: LEFT HEART CATHETERIZATION WITH CORONARY ANGIOGRAM;  Surgeon: Larey Dresser, MD;  Location: [facility] CATH LAB;  Service: Cardiovascular;  Laterality: N/A;   Family History  Problem Relation Age of Onset  . Stroke Mother   . Stroke Father   . Hypertension Sister   . Kidney cancer Brother   . Arthritis Brother   . Other Brother     knee problems, Bil TKR  . Hypertension      family history   Social  History  Substance Use Topics  . Smoking status: Former Smoker -- 2.00 packs/day for 40 years    Types: Cigarettes    Quit date: 01/04/1993  . Smokeless tobacco: Former Systems developer    Types: Chew  . Alcohol Use: No     Comment: 6 pack/day - 06/20/15 Pt states wuit drinking in the 1970's    Review of Systems 10/14 systems reviewed and are negative other than those stated in the HPI   Allergies  Acetylcholine; Alcohol-sulfur; Amitriptyline; Bupivacaine; Clomipramine hcl; Cocaine; Desipramine; Ergonovine; Flecainide; Lithium; Loxapine; Nortriptyline; Oxcarbazepine; Procainamide; Procaine; Propafenone; Propofol; and Trifluoperazine  Home Medications   Prior to Admission medications   Medication Sig Start Date End Date Taking? Authorizing Provider  amLODipine (NORVASC) 5 MG tablet Take 5 mg by mouth daily.   Yes Historical Provider, MD  ARIPiprazole (ABILIFY) 5 MG tablet TAKE 1 TABLET DAILY 06/18/15  Yes [name], MD  aspirin EC 81 MG tablet Take 1 tablet (81 mg total) by mouth daily. 06/18/15  Yes Mauricio Gerome Apley, MD  atorvastatin (LIPITOR) 20 MG tablet Take 20 mg by mouth daily.   Yes Historical Provider, MD  bisoprolol (ZEBETA) 5 MG tablet TAKE ONE-HALF (1/2) TABLET BY MOUTH ONCE  DAILY   Yes Historical Provider, MD  furosemide (LASIX) 40 MG tablet Take 1 tablet (40 mg total) by mouth daily as needed (swelling). Please keep upcoming appointment for further refills 06/18/15  Yes Mauricio Gerome Apley, MD  HYDROcodone-acetaminophen [facility]) 10-325 MG tablet Take 1 tablet by mouth every 6 (six) hours as needed for moderate pain or severe pain. 06/09/15  Yes Alycia Rossetti, MD  omeprazole (PRILOSEC) 20 MG capsule Take 1 capsule (20 mg total) by mouth daily. 06/05/15  Yes Scott Joylene Draft, PA-C  oxybutynin (DITROPAN-XL) 5 MG 24 hr tablet Take 5 mg by mouth at bedtime.   Yes Historical Provider, MD  predniSONE (DELTASONE) 10 MG tablet Take 10 mg by mouth daily as needed (FOR PAIN).   Yes  Historical Provider, MD  PROAIR HFA 108 (90 BASE) MCG/ACT inhaler USE 1 TO 2 INHALATIONS EVERY 6 HOURS AS NEEDED FOR WHEEZING OR SHORTNESS OF BREATH 10/07/14  Yes [name], MD  rOPINIRole (REQUIP) 1 MG tablet Take 1 mg by mouth at bedtime.    Yes Historical Provider, MD  SPIRIVA HANDIHALER 18 MCG inhalation capsule INHALE THE CONTENTS OF 1 CAPSULE WITH 2         INHALATIONS ONCE DAILY AS DIRECTED   Yes [name], MD  venlafaxine XR (EFFEXOR-XR) 150 MG 24 hr capsule Take 150 mg by mouth 2 (two) times daily.    Yes Historical Provider, MD  vitamin B-12 (CYANOCOBALAMIN) 1000 MCG tablet Take 1,000 mcg  by mouth daily.   Yes Historical Provider, MD   BP 133/72 mmHg  Pulse 70  Resp 13  Ht 6' (1.829 m)  Wt 220 lb (99.791 kg)  BMI 29.83 kg/m2  SpO2 93% Physical Exam Physical Exam  Nursing note and vitals reviewed. Constitutional: Non-toxic, and in no acute distress Head: Normocephalic and atraumatic.  Mouth/Throat: Oropharynx is clear and mucous membranes dry.  Neck: Normal range of motion. Neck supple.  Cardiovascular: Normal rate and regular rhythm.   Pulmonary/Chest: Effort normal and breath sounds normal.  Abdominal: Soft. There is no tenderness. There is no rebound and no guarding.  Musculoskeletal: Normal range of motion.  Neurological: Alert, left facial droop, PERRL, EOMI, no pronator drift or LE drift against gravity, dysarthric speech, sensation to light touch in tact throughout Skin: Skin is warm and dry.  Psychiatric: Cooperative  ED Course  Procedures (including critical care time) Labs Review Labs Reviewed  CBC - Abnormal; Notable for the following:    WBC 11.1 (*)    Hemoglobin 12.7 (*)    All other components within normal limits  COMPREHENSIVE METABOLIC PANEL - Abnormal; Notable for the following:    BUN 28 (*)    Creatinine, Ser 1.64 (*)    GFR calc non Af Amer 40 (*)    GFR calc Af Amer 46 (*)    All other components within normal limits  URINALYSIS,  ROUTINE W REFLEX MICROSCOPIC (NOT AT [facility]) - Abnormal; Notable for the following:    APPearance CLOUDY (*)    Nitrite POSITIVE (*)    Leukocytes, UA MODERATE (*)    All other components within normal limits  URINE MICROSCOPIC-ADD ON - Abnormal; Notable for the following:    Bacteria, UA MANY (*)    All other components within normal limits  I-STAT CHEM 8, ED - Abnormal; Notable for the following:    BUN 34 (*)    Creatinine, Ser 1.60 (*)    Calcium, Ion 1.08 (*)    All other components within normal limits  PROTIME-INR  APTT  DIFFERENTIAL  I-STAT TROPOININ, ED  CBG MONITORING, ED    Imaging Review Dg Chest 2 View  06/20/2015  CLINICAL DATA:  Altered mental status EXAM: CHEST  2 VIEW COMPARISON:  06/05/2015 FINDINGS: Cardiac shadow is within normal limits. The lungs demonstrate minimal platelike atelectasis on the left. The previously seen nodules in the right lung base are not well appreciated on this exam. No effusion is noted. No acute bony abnormality is seen. IMPRESSION: Stable changes from recent CT.  No acute abnormality noted. Electronically Signed   By: Inez Catalina M.D.   On: 06/20/2015 11:03   Ct Head Wo Contrast  06/20/2015  CLINICAL DATA:  Slurred speech and left-sided facial droop EXAM: CT HEAD WITHOUT CONTRAST TECHNIQUE: Contiguous axial images were obtained from the base of the skull through the vertex without intravenous contrast. COMPARISON:  06/16/2015 FINDINGS: Bony calvarium is intact. Mild atrophic changes are noted. Areas of decreased attenuation are noted consistent with chronic white matter ischemic change. The known right middle cerebral artery infarct changes are again noted and stable. No findings to suggest acute hemorrhage, acute infarction or space-occupying mass lesion is seen. Note is made of branching air within the right parotid gland this is likely related to intravenous air from IV placement. IMPRESSION: Stable atrophic and ischemic changes. No acute  abnormality is noted. Electronically Signed   By: Inez Catalina M.D.   On: 06/20/2015 10:53   I  have personally reviewed and evaluated these images and lab results as part of my medical decision-making.   EKG Interpretation   Date/Time:  Friday June 20 2015 09:37:34 EDT Ventricular Rate:  75 PR Interval:  180 QRS Duration: 120 QT Interval:  388 QTC Calculation: 433 R Axis:   -27 Text Interpretation:  Sinus rhythm IVCD, consider atypical RBBB Borderline  ST elevation, anterior leads No acute changes compared to EKG 06/15/2015  Confirmed by Rotunda Worden MD, Jenah Vanasten [id number]) on 06/20/2015 10:21:44 AM      MDM   Final diagnoses:  Acute cystitis without hematuria  Dysarthria  AKI (acute kidney injury) ([place])   75 year old male with history of multiple sclerosis and recent right MCA stroke who presents with worsening stroke deficits. Presentation with mild left-sided facial droop and dysarthria. Consistent with recent stroke deficits. No new deficits noted. CT head without hemorrhagic conversion. Workup concerning for dehydration with acute kidney injury. Also with a urinary tract infection. This likely causing recrudescence of his recent stroke symptoms. I discussed with neurology who did also recommend MRI with and without contrast of the brain which is pending. He received ceftriaxone and 1 L of IV fluids. After rehydration, symptoms reports significant improvement in his speech and symptoms. I discussed with hospitalist service who will admit to telemetry observation for further treatment and management.    Forde Dandy, MD 06/20/15 1226

## 2015-06-20 NOTE — Progress Notes (Signed)
Pt arrived to 5C18 via stretcher.  Pt ambulated from stretcher to bathroom to chair.  Pt alert and oriented.  VSS.  Will continue to monitor. Cori Razor, RN

## 2015-06-20 NOTE — ED Notes (Signed)
Pt arrives from PCP via [facility], EMS reports pt's wife states pt had slurred speech last night.  EMS reports pt noticed by bystander this am to be stumbling, speech slurred.  Pt seen at PCP's office, reports discharge yesterday s/p CVA.  Slight L sided facial droop noted, slurred speech.  Pt lethargic but AOx4 .

## 2015-06-21 ENCOUNTER — Encounter (HOSPITAL_COMMUNITY): Payer: TRICARE For Life (TFL)

## 2015-06-21 DIAGNOSIS — E785 Hyperlipidemia, unspecified: Secondary | ICD-10-CM | POA: Diagnosis present

## 2015-06-21 DIAGNOSIS — G2581 Restless legs syndrome: Secondary | ICD-10-CM | POA: Diagnosis present

## 2015-06-21 DIAGNOSIS — F329 Major depressive disorder, single episode, unspecified: Secondary | ICD-10-CM | POA: Diagnosis present

## 2015-06-21 DIAGNOSIS — Z8249 Family history of ischemic heart disease and other diseases of the circulatory system: Secondary | ICD-10-CM | POA: Diagnosis not present

## 2015-06-21 DIAGNOSIS — Z7982 Long term (current) use of aspirin: Secondary | ICD-10-CM | POA: Diagnosis not present

## 2015-06-21 DIAGNOSIS — G35 Multiple sclerosis: Secondary | ICD-10-CM

## 2015-06-21 DIAGNOSIS — R338 Other retention of urine: Secondary | ICD-10-CM | POA: Diagnosis present

## 2015-06-21 DIAGNOSIS — N179 Acute kidney failure, unspecified: Secondary | ICD-10-CM

## 2015-06-21 DIAGNOSIS — Z7952 Long term (current) use of systemic steroids: Secondary | ICD-10-CM | POA: Diagnosis not present

## 2015-06-21 DIAGNOSIS — N3000 Acute cystitis without hematuria: Secondary | ICD-10-CM | POA: Diagnosis present

## 2015-06-21 DIAGNOSIS — Z8673 Personal history of transient ischemic attack (TIA), and cerebral infarction without residual deficits: Secondary | ICD-10-CM | POA: Insufficient documentation

## 2015-06-21 DIAGNOSIS — I69328 Other speech and language deficits following cerebral infarction: Secondary | ICD-10-CM | POA: Diagnosis not present

## 2015-06-21 DIAGNOSIS — I639 Cerebral infarction, unspecified: Secondary | ICD-10-CM | POA: Diagnosis not present

## 2015-06-21 DIAGNOSIS — I351 Nonrheumatic aortic (valve) insufficiency: Secondary | ICD-10-CM | POA: Diagnosis not present

## 2015-06-21 DIAGNOSIS — I63411 Cerebral infarction due to embolism of right middle cerebral artery: Secondary | ICD-10-CM

## 2015-06-21 DIAGNOSIS — I10 Essential (primary) hypertension: Secondary | ICD-10-CM

## 2015-06-21 DIAGNOSIS — Z888 Allergy status to other drugs, medicaments and biological substances status: Secondary | ICD-10-CM | POA: Diagnosis not present

## 2015-06-21 DIAGNOSIS — J438 Other emphysema: Secondary | ICD-10-CM

## 2015-06-21 DIAGNOSIS — F0390 Unspecified dementia without behavioral disturbance: Secondary | ICD-10-CM | POA: Diagnosis present

## 2015-06-21 DIAGNOSIS — Z823 Family history of stroke: Secondary | ICD-10-CM | POA: Diagnosis not present

## 2015-06-21 DIAGNOSIS — I4510 Unspecified right bundle-branch block: Secondary | ICD-10-CM | POA: Diagnosis present

## 2015-06-21 DIAGNOSIS — E860 Dehydration: Secondary | ICD-10-CM | POA: Diagnosis present

## 2015-06-21 DIAGNOSIS — N189 Chronic kidney disease, unspecified: Secondary | ICD-10-CM | POA: Diagnosis present

## 2015-06-21 DIAGNOSIS — K219 Gastro-esophageal reflux disease without esophagitis: Secondary | ICD-10-CM | POA: Diagnosis present

## 2015-06-21 DIAGNOSIS — I130 Hypertensive heart and chronic kidney disease with heart failure and stage 1 through stage 4 chronic kidney disease, or unspecified chronic kidney disease: Secondary | ICD-10-CM | POA: Diagnosis present

## 2015-06-21 DIAGNOSIS — R471 Dysarthria and anarthria: Secondary | ICD-10-CM | POA: Diagnosis not present

## 2015-06-21 DIAGNOSIS — K5900 Constipation, unspecified: Secondary | ICD-10-CM | POA: Diagnosis present

## 2015-06-21 DIAGNOSIS — I69322 Dysarthria following cerebral infarction: Secondary | ICD-10-CM | POA: Diagnosis not present

## 2015-06-21 DIAGNOSIS — I712 Thoracic aortic aneurysm, without rupture: Secondary | ICD-10-CM | POA: Diagnosis present

## 2015-06-21 DIAGNOSIS — Z96651 Presence of right artificial knee joint: Secondary | ICD-10-CM | POA: Diagnosis present

## 2015-06-21 DIAGNOSIS — I5032 Chronic diastolic (congestive) heart failure: Secondary | ICD-10-CM

## 2015-06-21 DIAGNOSIS — Z66 Do not resuscitate: Secondary | ICD-10-CM | POA: Diagnosis present

## 2015-06-21 DIAGNOSIS — N401 Enlarged prostate with lower urinary tract symptoms: Secondary | ICD-10-CM | POA: Diagnosis present

## 2015-06-21 DIAGNOSIS — I2510 Atherosclerotic heart disease of native coronary artery without angina pectoris: Secondary | ICD-10-CM | POA: Diagnosis present

## 2015-06-21 DIAGNOSIS — R911 Solitary pulmonary nodule: Secondary | ICD-10-CM | POA: Diagnosis present

## 2015-06-21 DIAGNOSIS — W19XXXA Unspecified fall, initial encounter: Secondary | ICD-10-CM | POA: Diagnosis present

## 2015-06-21 DIAGNOSIS — Z8551 Personal history of malignant neoplasm of bladder: Secondary | ICD-10-CM | POA: Diagnosis not present

## 2015-06-21 DIAGNOSIS — J449 Chronic obstructive pulmonary disease, unspecified: Secondary | ICD-10-CM | POA: Diagnosis present

## 2015-06-21 DIAGNOSIS — Z79899 Other long term (current) drug therapy: Secondary | ICD-10-CM | POA: Diagnosis not present

## 2015-06-21 LAB — LIPID PANEL
CHOL/HDL RATIO: 4.2 ratio
CHOLESTEROL: 140 mg/dL (ref 0–200)
HDL: 33 mg/dL — AB (ref >40)
LDL Cholesterol: 59 mg/dL (ref 0–99)
TRIGLYCERIDES: 239 mg/dL — AB (ref <150)
VLDL: 48 mg/dL — ABNORMAL HIGH (ref 0–40)

## 2015-06-21 LAB — BASIC METABOLIC PANEL
ANION GAP: 6 (ref 5–15)
BUN: 17 mg/dL (ref 6–20)
CALCIUM: 8.7 mg/dL — AB (ref 8.9–10.3)
CO2: 26 mmol/L (ref 22–32)
CREATININE: 1 mg/dL (ref 0.61–1.24)
Chloride: 109 mmol/L (ref 101–111)
Glucose, Bld: 96 mg/dL (ref 65–99)
Potassium: 4.2 mmol/L (ref 3.5–5.1)
SODIUM: 141 mmol/L (ref 135–145)

## 2015-06-21 LAB — CBC
HCT: 39.3 % (ref 39.0–52.0)
Hemoglobin: 12 g/dL — ABNORMAL LOW (ref 13.0–17.0)
MCH: 29.2 pg (ref 26.0–34.0)
MCHC: 30.5 g/dL (ref 30.0–36.0)
MCV: 95.6 fL (ref 78.0–100.0)
PLATELETS: 280 10*3/uL (ref 150–400)
RBC: 4.11 MIL/uL — AB (ref 4.22–5.81)
RDW: 14.7 % (ref 11.5–15.5)
WBC: 7.8 10*3/uL (ref 4.0–10.5)

## 2015-06-21 MED ORDER — ASPIRIN 325 MG PO TABS
325.0000 mg | ORAL_TABLET | Freq: Every day | ORAL | Status: DC
  Administered 2015-06-21 – 2015-06-22 (×2): 325 mg via ORAL
  Filled 2015-06-21 (×2): qty 1

## 2015-06-21 MED ORDER — CLOPIDOGREL BISULFATE 75 MG PO TABS
75.0000 mg | ORAL_TABLET | Freq: Every day | ORAL | Status: DC
  Administered 2015-06-21 – 2015-06-23 (×3): 75 mg via ORAL
  Filled 2015-06-21 (×3): qty 1

## 2015-06-21 NOTE — Progress Notes (Signed)
STROKE TEAM PROGRESS NOTE   HISTORY OF PRESENT ILLNESS (per record) [name] [name] is a 75 y.o. male who presents with acute onset of diffuse weakness, dysarthria and gait instability. Per ED note: "75 year old male who presents with dysarthria and gait instability. He has a history of relapsing and remitting multiple sclerosis, COPD, hypertension and hyperlipidemia. He was discharged from the hospital 2 days ago after a right MCA stroke and was discharged with residual dysarthria and gait instability. States that he has been in his baseline health since discharge, this morning overexerted himself on a walk, his wife was called by a neighbor who noticed that he appeared very unwell after his walk. States that he had worsening of his dysarthria, difficulty walking, and appeared very short of breath. He denies any cough, chest pain, nausea or vomiting, diarrhea, fever, chills, dysuria or urinary difficulty. States that his symptoms currently are worse than when he left the hospital. He had a mechanical fall yesterday but did not hit his head."   SUBJECTIVE (INTERVAL HISTORY) No family is at the bedside initially but wife came in later.  Overall he feels his condition is rapidly improving. He had no complains and would like to go home. His new symptoms yesterday with generalized weakness, profoundly dysphoretic episode concerning for pre-syncope. MRI did not show significant changes from last admission. Pt now back to previous baseline.   OBJECTIVE Temp:  [97.6 F (36.4 C)-98.1 F (36.7 C)] 98.1 F (36.7 C) (06/17 0600) Pulse Rate:  [63-77] 63 (06/17 0600) Cardiac Rhythm:  [-] Normal sinus rhythm (06/17 0700) Resp:  [12-20] 18 (06/17 0600) BP: (126-152)/(62-88) 152/88 mmHg (06/17 0600) SpO2:  [92 %-99 %] 92 % (06/17 0810)  CBC:   Recent Labs Lab 06/15/15 1010 06/20/15 0940 06/20/15 1033 06/21/15 0540  WBC 9.3 11.1*  --  7.8  NEUTROABS 5.2 7.0  --   --   HGB 14.3 12.7* 14.3 12.0*  HCT  45.4 41.5 42.0 39.3  MCV 94.8 95.6  --  95.6  PLT 373 319  --  123456    Basic Metabolic Panel:   Recent Labs Lab 06/20/15 0940 06/20/15 1033 06/21/15 0540  NA 138 140 141  K 4.8 4.6 4.2  CL 105 103 109  CO2 25  --  26  GLUCOSE 75 68 96  BUN 28* 34* 17  CREATININE 1.64* 1.60* 1.00  CALCIUM 9.1  --  8.7*    Lipid Panel:     Component Value Date/Time   CHOL 140 06/21/2015 0534   TRIG 239* 06/21/2015 0534   HDL 33* 06/21/2015 0534   CHOLHDL 4.2 06/21/2015 0534   VLDL 48* 06/21/2015 0534   LDLCALC 59 06/21/2015 0534   HgbA1c:  Lab Results  Component Value Date   HGBA1C 6.4* 06/16/2015   Urine Drug Screen:     Component Value Date/Time   LABOPIA POSITIVE* 06/15/2015 1907   COCAINSCRNUR NONE DETECTED 06/15/2015 1907   LABBENZ NONE DETECTED 06/15/2015 1907   AMPHETMU NONE DETECTED 06/15/2015 1907   THCU NONE DETECTED 06/15/2015 1907   LABBARB NONE DETECTED 06/15/2015 1907      IMAGING I have personally reviewed the radiological images below and agree with the radiology interpretations. Blue text is my interpretation  Ct Head Wo Contrast 06/20/2015   Stable atrophic and ischemic changes. No acute abnormality is noted.   Mr [name] [name] Wo Contrast 06/20/2015   Subcentimeter focus of restricted diffusion in the superior RIGHT insula not present previously, representing progression of  ischemia. Otherwise stable multifocal areas of acute RIGHT MCA infarction. No interval development of large vessel occlusion, or significant mass effect. Mild postcontrast enhancement, consistent with the subacute time course. There is no significant change on DWI comparing with previous MRI. The "new" DWI signal likely the same DWI as before just due to different cut.   CT Angio Head W CM & or Wo CM 06/16/2015 CT HEAD: Evolving small RIGHT frontal/ MCA territory infarct. Moderate to severe white matter changes better characterized on recent MRI of the brain. CTA HEAD: Negative.  2D  Echocardiogram 06/16/15 - Left ventricle: The cavity size was normal. There was mild focal basal hypertrophy of the septum. Systolic function was normal. The estimated ejection fraction was in the range of 60% to 65%. Wall motion was normal; there were no regional wall motion abnormalities. Doppler parameters are consistent with abnormal left ventricular relaxation (grade 1 diastolic dysfunction). - Aortic valve: There was mild regurgitation. Impressions: - No cardiac source of emboli was indentified.  Carotid Doppler 06/16/15 There is 1-39% bilateral ICA stenosis. Vertebral artery flow is antegrade.   LE venous doppler - pending   PHYSICAL EXAM  Temp:  [97.5 F (36.4 C)-98.1 F (36.7 C)] 97.5 F (36.4 C) (06/17 1332) Pulse Rate:  [63-73] 71 (06/17 1332) Resp:  [18-20] 20 (06/17 1332) BP: (126-168)/(67-88) 157/82 mmHg (06/17 1332) SpO2:  [92 %-100 %] 100 % (06/17 1332)  General - Well nourished, well developed, in no apparent distress.  Ophthalmologic - Fundi not visualized due to noncooperation.  Cardiovascular - Regular rate and rhythm.  Mental Status -  Level of arousal and orientation to time, place, and person were intact. Language including expression, naming, repetition, comprehension was assessed and found intact. Fund of Knowledge was assessed and was intact.  Cranial Nerves II - XII - II - Visual field intact OU. III, IV, VI - Extraocular movements intact. V - Facial sensation intact bilaterally. VII - Facial movement intact bilaterally. VIII - Hearing & vestibular intact bilaterally. X - Palate elevates symmetrically. XI - Chin turning & shoulder shrug intact bilaterally. XII - Tongue protrusion intact.  Motor Strength - The patient's strength was normal in all extremities and pronator drift was absent.  Bulk was normal and fasciculations were absent.   Motor Tone - Muscle tone was assessed at the neck and appendages and was normal.  Reflexes - The  patient's reflexes were 1+ in all extremities and he had no pathological reflexes.  Sensory - Light touch, temperature/pinprick, vibration and proprioception, and Romberg testing were assessed and were symmetrical.    Coordination - The patient had normal movements in the hands and feet with no ataxia or dysmetria.  Tremor was absent.  Gait and Station - The patient's transfers, posture, gait, station, and turns were observed as normal.   ASSESSMENT/PLAN [name] is a 75 y.o. male with history of MS, previous tobacco use, COPD, depression, recent mechanical fall, coronary artery disease, recent stroke, thoracic aortic aneurysm, history of a lung nodule, chronic kidney disease, mild dementia, congestive heart failure, hypertension, hyperlipidemia, and Brugada syndrome presenting with diffuse weakness, dysarthria, and gait instability. He did not receive IV t-PA due to recent stroke.  Possible pre-syncope with UTI vs. ? Urosepsis  Pt was working outside under sun, humid  Lightheaded and SOB with generalized weakness  Took lasix prior to the episode  Did not check BP at the time of episode  Dehydration at time of admission  Worsening dysarthria from recent  stroke  UTI  UA WBC TNTC  On rocephine  Urine Cx pending  Recent stroke:  R frontal MCA territory infarct - embolic - unknown source.  Resultant  Deficit resolved  MRI - RIGHT insula and right frontal MCA territory infarct, same as last admission.  CTA Head 06/16/2015 - negative  Carotid Doppler - 06/16/2015 - Bilateral: mild soft plaque CCA and origin ICA. 1-39% ICA. Vert. artery flow is antegrade.  2D Echo - 06/16/2015 - EF 60-65%. No cardiac source of emboli was identified.  Will do TEE and loop recorder. Pt agrees to stay.  LDL - 59  HgbA1c - 6.4  VTE prophylaxis - Lovenox Diet Heart Room service appropriate?: Yes; Fluid consistency:: Thin  aspirin 81 mg daily prior to admission, now on aspirin 325  mg daily and clopidogrel 75 mg daily  Patient counseled to be compliant with his antithrombotic medications  Ongoing aggressive stroke risk factor management  Therapy recommendations:  No PT or OT follow-up recommended.  Disposition:  Pending  Hypertension  Stable  BP goal normotensive  Avoid hypotension  Hyperlipidemia  Home meds: Lipitor 20 mg daily resumed in hospital  LDL 59, goal < 70  Continue statin at discharge  Other Stroke Risk Factors  Advanced age  Cigarette smoker - quit smoking 1995  Obesity, Body mass index is 29.83 kg/(m^2)., recommend weight loss, diet and exercise as appropriate   Hx stroke/TIA  Family hx stroke (mother)  Coronary artery disease  Thoracic aortic aneurysm 4.1 cm  Other Active Problems    Hospital day # 1  Rosalin Hawking, MD PhD Stroke Neurology 06/21/2015 4:52 PM   To contact Stroke Continuity provider, please refer to [email or web address] After hours, contact General Neurology

## 2015-06-21 NOTE — Progress Notes (Signed)
VASCULAR LAB    Patient had normal carotid duplex on 06/16/15.  Please advise if you would like study repeated.     Thank you,  Jaelynne Hockley, RVT 06/21/2015, 10:42 AM

## 2015-06-21 NOTE — Progress Notes (Signed)
OT Cancellation Note  Patient Details Name: [name] [name] MRN: [id number] DOB: 08/29/1940   Cancelled Treatment:    Reason Eval/Treat Not Completed: OT screened, no needs identified, will sign off. Pt reports he is currently at baseline; no UE weakness, sensation deficits, or visual changes. PT reports pt is at functional baseline with mobility. No acute OT needs identified; signing off at this time. Please re-consult if needs change. Thank you for this referral.  Binnie Kand M.S., OTR/L Pager: [phone]  06/21/2015, 11:47 AM

## 2015-06-21 NOTE — Progress Notes (Signed)
PROGRESS NOTE  [name] [name] [id number] DOB: 11/16/1940 DOA: 06/20/2015 PCP: Jenna Luo TOM, MD  HPI/Recap of past 24 hours:  Sitting up in chair, pleasant, denies weakness, report slurred speech has improved, no fever, no abdominal pain  Assessment/Plan: Active Problems:   Multiple sclerosis (HCC)   Essential hypertension   Depression   Chronic diastolic CHF (congestive heart failure) (HCC)   COPD (chronic obstructive pulmonary disease) (HCC)   Hyperlipidemia   Gastroesophageal reflux disease without esophagitis   Slurred speech   History of CVA (cerebrovascular accident)   AKI (acute kidney injury) ([place])   Stroke like symptoms. He is s/p recent right MCA stroke (embolic?) with residual slurred speech at baseline. Today with worsening slurred speech / fatigue / dysarthria possibly secondary to dehydration and / or UTI vrs progression of stroke. Slurred speech improved after liter of fluid though not back to baseline  -MRI brain w and wo contrast on admission showed progression of ischemia in the superior right insula -no weakness in extremities, no need of PT/OT, speech has improved, passed bedside swallow eval -neurology consulted, input appreciated, asa increased to 325mg  and started plavix, TEE/loop recorder on Monday per neuro recommendation  Dehydation / UTI / AKI. Baseline creatinine 1.06, up to 1.6 today, likely secondary to volume depletion and uti -Ivf, abx, hold lasix until renal function improves -hold ditropan - f/u urine culture -Continue Rocephin  Multiple sclerosis ([place]). No flares in a few years per wife. Not on treatment. -Continue Requip for restless legs  Essential hypertension, controlled. Patient has already taken home am BP meds.   Chronic diastolic CHF (congestive heart failure). Grade I diastolic dysfunction. LVEF 60-65% on echo 3 days ago . No evidence for volume overload.  -Needs IVF for dehydration -monitor daily wts and  I+0 -continue beta blocker, BP meds, asa, statin  COPD (chronic obstructive pulmonary disease) ([place]). Stable.  -continue home proventil and spriva   Hyperlipidemia -continue home statin  Gastroesophageal reflux disease  -continue home PPI  Dehydration with acute kidney injury, dry mucous membranes. . -Received a liter of normal saline, will bolus with another 500 cc and start maintenance fluids -Hold home Lasix  COPD, stable. . -continue home Proventilair, [place]. . Stable  -Continue home effexor, abilify   DVT prophylaxis: Lovenox Code Status: DNR Family Communication: patient Disposition Plan: Discharge home after tee/loop record placement, likely early next week  Consults called: neurology/cardiology contacted by neurology    Procedures: tee/loop record placement  Antibiotics:  rocephin   Objective: BP 152/88 mmHg  Pulse 63  Temp(Src) 98.1 F (36.7 C) (Oral)  Resp 18  Ht 6' (1.829 m)  Wt 99.791 kg (220 lb)  BMI 29.83 kg/m2  SpO2 92%  Intake/Output Summary (Last 24 hours) at 06/21/15 0907 Last data filed at 06/21/15 [id number]  Gross per 24 hour  Intake    720 ml  Output   2100 ml  Net  -1380 ml   Filed Weights   06/20/15 0946  Weight: 99.791 kg (220 lb)    Exam:   General:  NAD, speech largely clear, no repetition  Cardiovascular: RRR  Respiratory: CTABL  Abdomen: Soft/ND/NT, positive BS  Musculoskeletal: No Edema  Neuro: aaox3, slight dysarthria, no weakness in extremities  Data Reviewed: Basic Metabolic Panel:  Recent Labs Lab 06/15/15 1010 06/17/15 0347 06/20/15 0855 06/20/15 0940 06/20/15 1033 06/21/15 0540  NA 137 135  --  138 140 141  K 4.6 3.9  --  4.8 4.6 4.2  CL 106 101  --  105 103 109  CO2 21* 25  --  25  --  26  GLUCOSE 119* 103* 75 75 68 96  BUN 25* 19  --  28* 34* 17  CREATININE 1.20 1.06  --  1.64* 1.60* 1.00  CALCIUM 9.1 9.3  --  9.1  --  8.7*   Liver Function  Tests:  Recent Labs Lab 06/15/15 1010 06/20/15 0940  AST 24 24  ALT 23 21  ALKPHOS 72 72  BILITOT 0.6 0.3  PROT 7.2 6.7  ALBUMIN 4.0 3.9   No results for input(s): LIPASE, AMYLASE in the last 168 hours.  Recent Labs Lab 06/15/15 1037  AMMONIA 38*   CBC:  Recent Labs Lab 06/15/15 1010 06/20/15 0940 06/20/15 1033 06/21/15 0540  WBC 9.3 11.1*  --  7.8  NEUTROABS 5.2 7.0  --   --   HGB 14.3 12.7* 14.3 12.0*  HCT 45.4 41.5 42.0 39.3  MCV 94.8 95.6  --  95.6  PLT 373 319  --  280   Cardiac Enzymes:   No results for input(s): CKTOTAL, CKMB, CKMBINDEX, TROPONINI in the last 168 hours. BNP (last 3 results) No results for input(s): BNP in the last 8760 hours.  ProBNP (last 3 results) No results for input(s): PROBNP in the last 8760 hours.  CBG:  Recent Labs Lab 06/15/15 0956 06/20/15 1023  GLUCAP 123* 80    No results found for this or any previous visit (from the past 240 hour(s)).   Studies: Dg Chest 2 View  06/20/2015  CLINICAL DATA:  Altered mental status EXAM: CHEST  2 VIEW COMPARISON:  06/05/2015 FINDINGS: Cardiac shadow is within normal limits. The lungs demonstrate minimal platelike atelectasis on the left. The previously seen nodules in the right lung base are not well appreciated on this exam. No effusion is noted. No acute bony abnormality is seen. IMPRESSION: Stable changes from recent CT.  No acute abnormality noted. Electronically Signed   By: Inez Catalina M.D.   On: 06/20/2015 11:03   Ct Head Wo Contrast  06/20/2015  CLINICAL DATA:  Slurred speech and left-sided facial droop EXAM: CT HEAD WITHOUT CONTRAST TECHNIQUE: Contiguous axial images were obtained from the base of the skull through the vertex without intravenous contrast. COMPARISON:  06/16/2015 FINDINGS: Bony calvarium is intact. Mild atrophic changes are noted. Areas of decreased attenuation are noted consistent with chronic white matter ischemic change. The known right middle cerebral artery  infarct changes are again noted and stable. No findings to suggest acute hemorrhage, acute infarction or space-occupying mass lesion is seen. Note is made of branching air within the right parotid gland this is likely related to intravenous air from IV placement. IMPRESSION: Stable atrophic and ischemic changes. No acute abnormality is noted. Electronically Signed   By: Inez Catalina M.D.   On: 06/20/2015 10:53   Mr Jeri Cos [id number] Contrast  06/20/2015  CLINICAL DATA:  Recent stroke.  Increasing confusion. EXAM: MRI HEAD WITHOUT AND WITH CONTRAST TECHNIQUE: Multiplanar, multiecho pulse sequences of the brain and surrounding structures were obtained without and with intravenous contrast. CONTRAST:  61mL MULTIHANCE GADOBENATE DIMEGLUMINE 529 MG/ML IV SOLN COMPARISON:  MRI brain 06/15/2015.  CT head earlier today. FINDINGS: There is a subcentimeter focus of restricted diffusion in the superior RIGHT insula, see image 28 series 4 and image 21 series 7, not present previously although some insular ischemia was present on 06/15/15. This represents slight progression of acute RIGHT MCA infarction.  Otherwise, no other significant changes. No hemorrhagic transformation, significant mass effect, or proximal vascular occlusion. Baseline atrophy and small vessel disease is stable. Post infusion, mild intravascular and parenchymal enhancement within the areas of ischemia characterizing the subacute time course are evident. IMPRESSION: Subcentimeter focus of restricted diffusion in the superior RIGHT insula not present previously, representing progression of ischemia. Otherwise stable multifocal areas of acute RIGHT MCA infarction. No interval development of large vessel occlusion, or significant mass effect. Mild postcontrast enhancement, consistent with the subacute time course. Electronically Signed   By: Staci Righter M.D.   On: 06/20/2015 14:53    Scheduled Meds: . ARIPiprazole  5 mg Oral Daily  . aspirin  325 mg Oral Daily   . atorvastatin  20 mg Oral q1800  . cefTRIAXone (ROCEPHIN)  IV  1 g Intravenous Q24H  . clopidogrel  75 mg Oral Daily  . enoxaparin (LOVENOX) injection  40 mg Subcutaneous Q24H  . pantoprazole  40 mg Oral Daily  . rOPINIRole  1 mg Oral QHS  . tiotropium  18 mcg Inhalation Daily  . venlafaxine XR  150 mg Oral BID    Continuous Infusions: . sodium chloride 100 mL/hr at 06/21/15 [id number]     Time spent: 17mins  Ormand Senn MD, PhD  [facility] Pager [phone]. If 7PM-7AM, please contact night-coverage at [email or web address], password [facility] 06/21/2015, 9:07 AM

## 2015-06-21 NOTE — Evaluation (Signed)
Physical Therapy Evaluation Patient Details Name: [name] [name] MRN: [id number] DOB: 1940-08-09 Today's Date: 06/21/2015   History of Present Illness  [name] [name] is a 75 y.o. male with medical history significant for Rt CVA 06/15/15 and d/c home 6/13; multiple sclerosis, chronic diastolic heart failure on Lasix, hypertension, CAD, COPD, dyslipidemia, BPH, depression and GERD. Presented after walking dog and neighbor/wife noted incr dysarthria and difficulty walking. MRI showed Subcentimeter focus of restricted diffusion in the superior RIGHT    Clinical Impression  Patient evaluated by Physical Therapy with no further acute PT needs identified. Patient scored 4 (highest score) on mulitple higher-level tasks of the [employer] Assessment. Demonstrated safe ambulation pushing IV pole and with no device. Difficult to assess if his cognition is at his baseline (wife not present and pt very irritable about being hospitalized again and not allowed to get up and move around). PT is signing off. Patient and RN aware and in agreement. Thank you for this referral.     Follow Up Recommendations No PT follow up;Supervision/Assistance - 24 hour (?cognitive changes; wife not present to confirm)    Equipment Recommendations  None recommended by PT    Recommendations for Other Services       Precautions / Restrictions Precautions Precautions: Fall Precaution Comments: vision deficits on L (per recent record) Restrictions Weight Bearing Restrictions: No      Mobility  Bed Mobility Overal bed mobility: Modified Independent                Transfers Overall transfer level: Independent Equipment used: None                Ambulation/Gait Ambulation/Gait assistance: Modified independent (Device/Increase time) Ambulation Distance (Feet): 500 Feet Assistive device: None (vs pushing his IV pole) Gait Pattern/deviations: Step-through pattern;Decreased stride length;Shuffle  (shuffles more when pushing IV (pt desparately wants to walk )        Stairs Stairs: Yes Stairs assistance: Supervision Stair Management: One rail Right;Alternating pattern;Forwards Number of Stairs: 2 (x 2) General stair comments: supervision only due to IV tubing (pt with decr awareness of tubing)  Wheelchair Mobility    Modified Rankin (Stroke Patients Only) Modified Rankin (Stroke Patients Only) Pre-Morbid Rankin Score: Moderate disability Modified Rankin: Moderate disability     Balance Overall balance assessment: Modified Independent                               Standardized Balance Assessment Standardized Balance Assessment : Berg Balance Test Berg Balance Test Sit to Stand: Able to stand without using hands and stabilize independently Standing Unsupported: Able to stand safely 2 minutes Sitting with Back Unsupported but Feet Supported on Floor or Stool: Able to sit safely and securely 2 minutes Stand to Sit: Sits safely with minimal use of hands Transfers: Able to transfer safely, minor use of hands From Standing, Reach Forward with Outstretched Arm: Can reach confidently >25 cm (10") Turn 360 Degrees: Able to turn 360 degrees safely in 4 seconds or less Standing Unsupported, Alternately Place Feet on Step/Stool: Able to stand independently and safely and complete 8 steps in 20 seconds         Pertinent Vitals/Pain      Home Living Family/patient expects to be discharged to:: Private residence Living Arrangements: Spouse/significant other Available Help at Discharge: Family;Available 24 hours/day Type of Home: House Home Access: Level entry     Home Layout: One level Home  Equipment: Grab bars - tub/shower;Crutches;Walker - 2 wheels;Shower seat - built in      Prior Function Level of Independence: Independent         Comments: was just on a trip to [place] recently     Hand Dominance   Dominant Hand: Right    Extremity/Trunk  Assessment   Upper Extremity Assessment: Defer to OT evaluation           Lower Extremity Assessment: Overall WFL for tasks assessed      Cervical / Trunk Assessment: Normal  Communication   Communication: HOH  Cognition Arousal/Alertness: Awake/alert Behavior During Therapy:  (Frustrated/irritated at hospitalization) Overall Cognitive Status: No family/caregiver present to determine baseline cognitive functioning                      General Comments      Exercises        Assessment/Plan    PT Assessment Patent does not need any further PT services  PT Diagnosis Generalized weakness   PT Problem List    PT Treatment Interventions     PT Goals (Current goals can be found in the Care Plan section) Acute Rehab PT Goals Patient Stated Goal: home PT Goal Formulation: All assessment and education complete, DC therapy    Frequency     Barriers to discharge        Co-evaluation               End of Session Equipment Utilized During Treatment: Gait belt Activity Tolerance: Patient tolerated treatment well Patient left: in chair;with call bell/phone within reach;with chair alarm set Nurse Communication: Mobility status (initiated sign-off for up independently)    Functional Assessment Tool Used: clinical judgement Functional Limitation: Mobility: Walking and moving around Mobility: Walking and Moving Around Current Status [id number]): At least 1 percent but less than 20 percent impaired, limited or restricted Mobility: Walking and Moving Around Goal Status [phone]): At least 1 percent but less than 20 percent impaired, limited or restricted Mobility: Walking and Moving Around Discharge Status [phone]): At least 1 percent but less than 20 percent impaired, limited or restricted    Time: 0916-0944 PT Time Calculation (min) (ACUTE ONLY): 28 min   Charges:   PT Evaluation $PT Eval Low Complexity: 1 Procedure PT Treatments $Gait Training: 8-22 mins   PT  G Codes:   PT G-Codes **NOT FOR INPATIENT CLASS** Functional Assessment Tool Used: clinical judgement Functional Limitation: Mobility: Walking and moving around Mobility: Walking and Moving Around Current Status [id number]): At least 1 percent but less than 20 percent impaired, limited or restricted Mobility: Walking and Moving Around Goal Status [phone]): At least 1 percent but less than 20 percent impaired, limited or restricted Mobility: Walking and Moving Around Discharge Status [phone]): At least 1 percent but less than 20 percent impaired, limited or restricted    Tymere Depuy 06/21/2015, 10:04 AM Pager [phone]

## 2015-06-22 ENCOUNTER — Other Ambulatory Visit: Payer: Self-pay | Admitting: Cardiology

## 2015-06-22 ENCOUNTER — Inpatient Hospital Stay (HOSPITAL_COMMUNITY): Payer: Medicare Other

## 2015-06-22 DIAGNOSIS — R471 Dysarthria and anarthria: Secondary | ICD-10-CM | POA: Insufficient documentation

## 2015-06-22 DIAGNOSIS — I639 Cerebral infarction, unspecified: Secondary | ICD-10-CM

## 2015-06-22 LAB — CBC
HCT: 40.7 % (ref 39.0–52.0)
Hemoglobin: 12.6 g/dL — ABNORMAL LOW (ref 13.0–17.0)
MCH: 29.2 pg (ref 26.0–34.0)
MCHC: 31 g/dL (ref 30.0–36.0)
MCV: 94.4 fL (ref 78.0–100.0)
Platelets: 285 10*3/uL (ref 150–400)
RBC: 4.31 MIL/uL (ref 4.22–5.81)
RDW: 14.5 % (ref 11.5–15.5)
WBC: 7.4 10*3/uL (ref 4.0–10.5)

## 2015-06-22 LAB — BASIC METABOLIC PANEL
Anion gap: 7 (ref 5–15)
BUN: 11 mg/dL (ref 6–20)
CALCIUM: 8.8 mg/dL — AB (ref 8.9–10.3)
CO2: 26 mmol/L (ref 22–32)
CREATININE: 0.9 mg/dL (ref 0.61–1.24)
Chloride: 108 mmol/L (ref 101–111)
GFR calc non Af Amer: >60 mL/min (ref >60)
Glucose, Bld: 91 mg/dL (ref 65–99)
Potassium: 4.2 mmol/L (ref 3.5–5.1)
SODIUM: 141 mmol/L (ref 135–145)

## 2015-06-22 LAB — URINE CULTURE

## 2015-06-22 NOTE — Progress Notes (Signed)
PROGRESS NOTE  [name] [name] [id number] DOB: February 14, 1940 DOA: 06/20/2015 PCP: Jenna Luo TOM, MD  HPI/Recap of past 24 hours:  Sitting up in chair, pleasant, denies weakness, report slurred speech has improved, no fever, no abdominal pain  Assessment/Plan: Active Problems:   Multiple sclerosis (HCC)   Essential hypertension   Depression   Chronic diastolic CHF (congestive heart failure) (HCC)   COPD (chronic obstructive pulmonary disease) (HCC)   Hyperlipidemia   Gastroesophageal reflux disease without esophagitis   Slurred speech   History of CVA (cerebrovascular accident)   AKI (acute kidney injury) ([place])   Acute cystitis without hematuria   Stroke [facility])   Stroke like symptoms. He is s/p recent right MCA stroke (embolic?) with residual slurred speech at baseline. Today with worsening slurred speech / fatigue / dysarthria possibly secondary to dehydration and / or UTI vrs progression of stroke. Slurred speech improved after liter of fluid though not back to baseline  -MRI brain w and wo contrast on admission showed progression of ischemia in the superior right insula -no weakness in extremities, no need of PT/OT, speech has improved, passed bedside swallow eval -neurology consulted, input appreciated, asa increased to 325mg  and started plavix, TEE/loop recorder on Monday per neuro recommendation  Dehydation / UTI / AKI. Baseline creatinine 1.06, up to 1.6 today, likely secondary to volume depletion and uti -Ivf, abx, hold lasix until renal function improves -hold ditropan - f/u urine culture -Continue Rocephin  Multiple sclerosis ([place]). No flares in a few years per wife. Not on treatment. -Continue Requip for restless legs  Essential hypertension, controlled. Patient has already taken home am BP meds.   Chronic diastolic CHF (congestive heart failure). Grade I diastolic dysfunction. LVEF 60-65% on echo 3 days ago . No evidence for volume overload.  -Needs  IVF for dehydration -monitor daily wts and I+0 -continue beta blocker, BP meds, asa, statin  COPD (chronic obstructive pulmonary disease) ([place]). Stable.  -continue home proventil and spriva   Hyperlipidemia -continue home statin  Gastroesophageal reflux disease  -continue home PPI  Dehydration with acute kidney injury, dry mucous membranes. . -Received a liter of normal saline, will bolus with another 500 cc and start maintenance fluids -Hold home Lasix  COPD, stable. . -continue home Proventilair, [place]. . Stable  -Continue home effexor, abilify  H/o bladder cancer and bladder spasm: close followed with urology at [facility].  DVT prophylaxis: Lovenox Code Status: DNR Family Communication: patient Disposition Plan: Discharge home after tee/loop record placement, likely early next week  Consults called: neurology/cardiology contacted by neurology    Procedures: tee/loop record placement on 6/19  Antibiotics:  rocephin   Objective: BP 174/78 mmHg  Pulse 72  Temp(Src) 98.5 F (36.9 C) (Oral)  Resp 18  Ht 6' (1.829 m)  Wt 99.791 kg (220 lb)  BMI 29.83 kg/m2  SpO2 95% No intake or output data in the 24 hours ending 06/22/15 0913 Filed Weights   06/20/15 0946  Weight: 99.791 kg (220 lb)    Exam:   General:  NAD, speech largely clear, no repetition  Cardiovascular: RRR  Respiratory: CTABL  Abdomen: Soft/ND/NT, positive BS  Musculoskeletal: No Edema  Neuro: aaox3, slight dysarthria, no weakness in extremities  Data Reviewed: Basic Metabolic Panel:  Recent Labs Lab 06/15/15 1010 06/17/15 0347 06/20/15 0855 06/20/15 0940 06/20/15 1033 06/21/15 0540 06/22/15 0438  NA 137 135  --  138 140 141 141  K 4.6 3.9  --  4.8 4.6 4.2 4.2  CL 106 101  --  105 103 109 108  CO2 21* 25  --  25  --  26 26  GLUCOSE 119* 103* 75 75 68 96 91  BUN 25* 19  --  28* 34* 17 11  CREATININE 1.20 1.06  --  1.64* 1.60*  1.00 0.90  CALCIUM 9.1 9.3  --  9.1  --  8.7* 8.8*   Liver Function Tests:  Recent Labs Lab 06/15/15 1010 06/20/15 0940  AST 24 24  ALT 23 21  ALKPHOS 72 72  BILITOT 0.6 0.3  PROT 7.2 6.7  ALBUMIN 4.0 3.9   No results for input(s): LIPASE, AMYLASE in the last 168 hours.  Recent Labs Lab 06/15/15 1037  AMMONIA 38*   CBC:  Recent Labs Lab 06/15/15 1010 06/20/15 0940 06/20/15 1033 06/21/15 0540 06/22/15 0438  WBC 9.3 11.1*  --  7.8 7.4  NEUTROABS 5.2 7.0  --   --   --   HGB 14.3 12.7* 14.3 12.0* 12.6*  HCT 45.4 41.5 42.0 39.3 40.7  MCV 94.8 95.6  --  95.6 94.4  PLT 373 319  --  280 285   Cardiac Enzymes:   No results for input(s): CKTOTAL, CKMB, CKMBINDEX, TROPONINI in the last 168 hours. BNP (last 3 results) No results for input(s): BNP in the last 8760 hours.  ProBNP (last 3 results) No results for input(s): PROBNP in the last 8760 hours.  CBG:  Recent Labs Lab 06/15/15 0956 06/20/15 1023  GLUCAP 123* 80    Recent Results (from the past 240 hour(s))  Urine culture     Status: Abnormal   Collection Time: 06/20/15 11:40 AM  Result Value Ref Range Status   Specimen Description URINE, CATHETERIZED  Final   Special Requests NONE  Final   Culture >=100,000 COLONIES/mL KLEBSIELLA OXYTOCA (A)  Final   Report Status 06/22/2015 FINAL  Final   Organism ID, Bacteria KLEBSIELLA OXYTOCA (A)  Final      Susceptibility   Klebsiella oxytoca - MIC*    AMPICILLIN >=32 RESISTANT Resistant     CEFAZOLIN 8 SENSITIVE Sensitive     CEFTRIAXONE <=1 SENSITIVE Sensitive     CIPROFLOXACIN <=0.25 SENSITIVE Sensitive     GENTAMICIN <=1 SENSITIVE Sensitive     IMIPENEM <=0.25 SENSITIVE Sensitive     NITROFURANTOIN <=16 SENSITIVE Sensitive     TRIMETH/SULFA <=20 SENSITIVE Sensitive     AMPICILLIN/SULBACTAM 8 SENSITIVE Sensitive     PIP/TAZO <=4 SENSITIVE Sensitive     * >=100,000 COLONIES/mL KLEBSIELLA OXYTOCA     Studies: No results found.  Scheduled Meds: .  ARIPiprazole  5 mg Oral Daily  . aspirin  325 mg Oral Daily  . atorvastatin  20 mg Oral q1800  . cefTRIAXone (ROCEPHIN)  IV  1 g Intravenous Q24H  . clopidogrel  75 mg Oral Daily  . enoxaparin (LOVENOX) injection  40 mg Subcutaneous Q24H  . pantoprazole  40 mg Oral Daily  . rOPINIRole  1 mg Oral QHS  . tiotropium  18 mcg Inhalation Daily  . venlafaxine XR  150 mg Oral BID    Continuous Infusions: . sodium chloride 75 mL/hr at 06/21/15 1729     Time spent: 58mins  Prather Failla MD, PhD  [facility] Pager [phone]. If 7PM-7AM, please contact night-coverage at [email or web address], password [facility] 06/22/2015, 9:13 AM  LOS: 1 day

## 2015-06-22 NOTE — Progress Notes (Signed)
STROKE TEAM PROGRESS NOTE   SUBJECTIVE (INTERVAL HISTORY) No family is at the bedside.  He is now back to previous baseline. His symptoms for this admission more consistent with pre-syncope. He is waiting for TEE tomorrow. LE venous doppler not done yet.   OBJECTIVE Temp:  [97.5 F (36.4 C)-98.5 F (36.9 C)] 97.9 F (36.6 C) (06/18 1042) Pulse Rate:  [71-80] 80 (06/18 1042) Cardiac Rhythm:  [-] Bundle branch block;Heart block (06/18 0700) Resp:  [18-22] 20 (06/18 1042) BP: (152-193)/(69-88) 161/88 mmHg (06/18 1042) SpO2:  [95 %-100 %] 98 % (06/18 1042)  CBC:   Recent Labs Lab 06/20/15 0940  06/21/15 0540 06/22/15 0438  WBC 11.1*  --  7.8 7.4  NEUTROABS 7.0  --   --   --   HGB 12.7*  < > 12.0* 12.6*  HCT 41.5  < > 39.3 40.7  MCV 95.6  --  95.6 94.4  PLT 319  --  280 285  < > = values in this interval not displayed.  Basic Metabolic Panel:   Recent Labs Lab 06/21/15 0540 06/22/15 0438  NA 141 141  K 4.2 4.2  CL 109 108  CO2 26 26  GLUCOSE 96 91  BUN 17 11  CREATININE 1.00 0.90  CALCIUM 8.7* 8.8*    Lipid Panel:     Component Value Date/Time   CHOL 140 06/21/2015 0534   TRIG 239* 06/21/2015 0534   HDL 33* 06/21/2015 0534   CHOLHDL 4.2 06/21/2015 0534   VLDL 48* 06/21/2015 0534   LDLCALC 59 06/21/2015 0534   HgbA1c:  Lab Results  Component Value Date   HGBA1C 6.4* 06/16/2015   Urine Drug Screen:     Component Value Date/Time   LABOPIA POSITIVE* 06/15/2015 1907   COCAINSCRNUR NONE DETECTED 06/15/2015 1907   LABBENZ NONE DETECTED 06/15/2015 1907   AMPHETMU NONE DETECTED 06/15/2015 1907   THCU NONE DETECTED 06/15/2015 1907   LABBARB NONE DETECTED 06/15/2015 1907      IMAGING I have personally reviewed the radiological images below and agree with the radiology interpretations. Blue text is my interpretation  Ct Head Wo Contrast 06/20/2015   Stable atrophic and ischemic changes. No acute abnormality is noted.   Mr Kizzie Fantasia Contrast 06/20/2015    Subcentimeter focus of restricted diffusion in the superior RIGHT insula not present previously, representing progression of ischemia. Otherwise stable multifocal areas of acute RIGHT MCA infarction. No interval development of large vessel occlusion, or significant mass effect. Mild postcontrast enhancement, consistent with the subacute time course. There is no significant change on DWI comparing with previous MRI. The "new" DWI signal likely the same DWI as before just due to different cut.   CT Angio Head W CM & or Wo CM 06/16/2015 CT HEAD: Evolving small RIGHT frontal/ MCA territory infarct. Moderate to severe white matter changes better characterized on recent MRI of the brain. CTA HEAD: Negative.  2D Echocardiogram 06/16/15 - Left ventricle: The cavity size was normal. There was mild focal basal hypertrophy of the septum. Systolic function was normal. The estimated ejection fraction was in the range of 60% to 65%. Wall motion was normal; there were no regional wall motion abnormalities. Doppler parameters are consistent with abnormal left ventricular relaxation (grade 1 diastolic dysfunction). - Aortic valve: There was mild regurgitation. Impressions: - No cardiac source of emboli was indentified.  Carotid Doppler 06/16/15 There is 1-39% bilateral ICA stenosis. Vertebral artery flow is antegrade.   LE venous doppler - pending  TEE - pending   PHYSICAL EXAM  Temp:  [97.5 F (36.4 C)-98.5 F (36.9 C)] 97.9 F (36.6 C) (06/18 1042) Pulse Rate:  [71-80] 80 (06/18 1042) Resp:  [18-22] 20 (06/18 1042) BP: (152-193)/(69-88) 161/88 mmHg (06/18 1042) SpO2:  [95 %-100 %] 98 % (06/18 1042)  General - Well nourished, well developed, in no apparent distress.  Ophthalmologic - Fundi not visualized due to noncooperation.  Cardiovascular - Regular rate and rhythm.  Mental Status -  Level of arousal and orientation to time, place, and person were intact. Language including  expression, naming, repetition, comprehension was assessed and found intact. Fund of Knowledge was assessed and was intact.  Cranial Nerves II - XII - II - Visual field intact OU. III, IV, VI - Extraocular movements intact. V - Facial sensation intact bilaterally. VII - Facial movement intact bilaterally. VIII - Hearing & vestibular intact bilaterally. X - Palate elevates symmetrically. XI - Chin turning & shoulder shrug intact bilaterally. XII - Tongue protrusion intact.  Motor Strength - The patient's strength was normal in all extremities and pronator drift was absent.  Bulk was normal and fasciculations were absent.   Motor Tone - Muscle tone was assessed at the neck and appendages and was normal.  Reflexes - The patient's reflexes were 1+ in all extremities and he had no pathological reflexes.  Sensory - Light touch, temperature/pinprick, vibration and proprioception, and Romberg testing were assessed and were symmetrical.    Coordination - The patient had normal movements in the hands and feet with no ataxia or dysmetria.  Tremor was absent.  Gait and Station - The patient's transfers, posture, gait, station, and turns were observed as normal.   ASSESSMENT/PLAN [name] [name] is a 75 y.o. male with history of MS, previous tobacco use, COPD, depression, recent mechanical fall, coronary artery disease, recent stroke, thoracic aortic aneurysm, history of a lung nodule, chronic kidney disease, mild dementia, congestive heart failure, hypertension, hyperlipidemia, and Brugada syndrome presenting with diffuse weakness, dysarthria, and gait instability. He did not receive IV t-PA due to recent stroke.  Pre-syncope with UTI vs. ? Urosepsis  Pt was working outside under sun, humid  Lightheaded and SOB with generalized weakness - wanted to sit down  Took lasix prior to the episode  Did not check BP at the time of episode  Dehydration at time of admission  Worsening  dysarthria from recent stroke  UTI  UA WBC TNTC  On rocephine - Day # 2  Urine Cx > 100,000 col of Klebsiella - sensitive to Rocephin  Recent stroke:  R frontal MCA territory infarct - embolic - unknown source.  Resultant  Deficit resolved  MRI - RIGHT insula and right frontal MCA territory infarct, same as last admission.  CTA Head 06/16/2015 - negative  Carotid Doppler - 06/16/2015 - Bilateral: mild soft plaque CCA and origin ICA. 1-39% ICA. Vert. artery flow is antegrade.  2D Echo - 06/16/2015 - EF 60-65%. No cardiac source of emboli was identified.  LE venous doppler pending  Will do TEE and loop recorder. Pt agrees to stay. Tentatively scheduled for Monday. Message sent to [place].  LDL - 59  HgbA1c - 6.4  VTE prophylaxis - Lovenox Diet Heart Room service appropriate?: Yes; Fluid consistency:: Thin Diet NPO time specified Except for: Sips with Meds  aspirin 81 mg daily prior to admission, now on clopidogrel 75 mg daily. Continue plavix on discharge.  Patient counseled to be compliant with his antithrombotic medications  Ongoing aggressive stroke risk factor management  Therapy recommendations:  No PT or OT follow-up recommended.  Disposition:  Pending  Hypertension  Stable  BP goal normotensive  Avoid hypotension  Hyperlipidemia  Home meds: Lipitor 20 mg daily resumed in hospital  LDL 59, goal < 70  Continue statin at discharge  Other Stroke Risk Factors  Advanced age  Cigarette smoker - quit smoking 1995  Obesity, Body mass index is 29.83 kg/(m^2)., recommend weight loss, diet and exercise as appropriate   Hx stroke/TIA  Family hx stroke (mother)  Coronary artery disease  Thoracic aortic aneurysm 4.1 cm  Other Active Problems    Hospital day # 1  Rosalin Hawking, MD PhD Stroke Neurology 06/22/2015 3:02 PM    To contact Stroke Continuity provider, please refer to [email or web address] After hours, contact General Neurology

## 2015-06-22 NOTE — Progress Notes (Signed)
VASCULAR LAB PRELIMINARY  PRELIMINARY  PRELIMINARY  PRELIMINARY  Bilateral lower extremity venous duplex completed.    Preliminary report:  There is no DVT or SVT noted in the bilateral lower extremities.   Analea Muller, RVT 06/22/2015, 4:03 PM

## 2015-06-23 ENCOUNTER — Encounter (HOSPITAL_COMMUNITY)
Admission: EM | Disposition: A | Discharge status: Home / Self Care | Payer: Self-pay | Source: Home / Self Care | Attending: Internal Medicine

## 2015-06-23 ENCOUNTER — Encounter (HOSPITAL_COMMUNITY): Payer: Self-pay | Admitting: *Deleted

## 2015-06-23 ENCOUNTER — Inpatient Hospital Stay (HOSPITAL_COMMUNITY): Payer: Medicare Other

## 2015-06-23 ENCOUNTER — Ambulatory Visit: Payer: Medicare Other | Admitting: Family Medicine

## 2015-06-23 DIAGNOSIS — I639 Cerebral infarction, unspecified: Secondary | ICD-10-CM

## 2015-06-23 DIAGNOSIS — I351 Nonrheumatic aortic (valve) insufficiency: Secondary | ICD-10-CM

## 2015-06-23 DIAGNOSIS — R55 Syncope and collapse: Secondary | ICD-10-CM | POA: Insufficient documentation

## 2015-06-23 DIAGNOSIS — N390 Urinary tract infection, site not specified: Secondary | ICD-10-CM

## 2015-06-23 HISTORY — PX: EP IMPLANTABLE DEVICE: SHX172B

## 2015-06-23 HISTORY — PX: TEE WITHOUT CARDIOVERSION: SHX5443

## 2015-06-23 SURGERY — LOOP RECORDER INSERTION

## 2015-06-23 SURGERY — ECHOCARDIOGRAM, TRANSESOPHAGEAL
Anesthesia: Moderate Sedation

## 2015-06-23 MED ORDER — AMLODIPINE BESYLATE 5 MG PO TABS
5.0000 mg | ORAL_TABLET | Freq: Every day | ORAL | Status: DC
  Administered 2015-06-23: 5 mg via ORAL
  Filled 2015-06-23: qty 1

## 2015-06-23 MED ORDER — CLOPIDOGREL BISULFATE 75 MG PO TABS: 75.0000 mg | ORAL_TABLET | Freq: Every day | ORAL | Status: DC

## 2015-06-23 MED ORDER — FENTANYL CITRATE (PF) 100 MCG/2ML IJ SOLN
INTRAMUSCULAR | Status: AC
  Filled 2015-06-23: qty 2

## 2015-06-23 MED ORDER — CEPHALEXIN 500 MG PO CAPS: 500.0000 mg | ORAL_CAPSULE | Freq: Two times a day (BID) | ORAL | Status: DC

## 2015-06-23 MED ORDER — MIDAZOLAM HCL 5 MG/ML IJ SOLN
INTRAMUSCULAR | Status: AC
  Filled 2015-06-23: qty 2

## 2015-06-23 MED ORDER — LIDOCAINE-EPINEPHRINE 1 %-1:100000 IJ SOLN
INTRAMUSCULAR | Status: DC | PRN
  Administered 2015-06-23: 15 mL

## 2015-06-23 MED ORDER — BISOPROLOL FUMARATE 5 MG PO TABS
2.5000 mg | ORAL_TABLET | Freq: Every day | ORAL | Status: DC
  Administered 2015-06-23: 2.5 mg via ORAL
  Filled 2015-06-23: qty 0.5

## 2015-06-23 MED ORDER — LIDOCAINE VISCOUS 2 % MT SOLN
OROMUCOSAL | Status: AC
  Filled 2015-06-23: qty 15

## 2015-06-23 MED ORDER — LIDOCAINE-EPINEPHRINE 1 %-1:100000 IJ SOLN
INTRAMUSCULAR | Status: AC
  Filled 2015-06-23: qty 1

## 2015-06-23 MED ORDER — MIDAZOLAM HCL 10 MG/2ML IJ SOLN
INTRAMUSCULAR | Status: DC | PRN
  Administered 2015-06-23: 1 mg via INTRAVENOUS
  Administered 2015-06-23 (×2): 2 mg via INTRAVENOUS

## 2015-06-23 MED ORDER — FENTANYL CITRATE (PF) 100 MCG/2ML IJ SOLN
INTRAMUSCULAR | Status: DC | PRN
  Administered 2015-06-23 (×2): 25 ug via INTRAVENOUS

## 2015-06-23 SURGICAL SUPPLY — 2 items
LOOP REVEAL LINQSYS (Prosthesis & Implant Heart) ×3 IMPLANT
PACK LOOP INSERTION (CUSTOM PROCEDURE TRAY) ×3 IMPLANT

## 2015-06-23 NOTE — Progress Notes (Signed)
Discharge instructions reviewed with patient/family. All questions answered at this time. Transport home by family.   Weyman Bogdon, RN 

## 2015-06-23 NOTE — H&P (View-Only) (Signed)
PROGRESS NOTE  [name] [name] [id number] DOB: 1940-04-14 DOA: 06/20/2015 PCP: Jenna Luo TOM, MD  HPI/Recap of past 24 hours:  Sitting up in chair, pleasant, denies weakness, report slurred speech has improved, no fever, no abdominal pain  Assessment/Plan: Active Problems:   Multiple sclerosis (HCC)   Essential hypertension   Depression   Chronic diastolic CHF (congestive heart failure) (HCC)   COPD (chronic obstructive pulmonary disease) (HCC)   Hyperlipidemia   Gastroesophageal reflux disease without esophagitis   Slurred speech   History of CVA (cerebrovascular accident)   AKI (acute kidney injury) ([place])   Acute cystitis without hematuria   Stroke [facility])   Stroke like symptoms. He is s/p recent right MCA stroke (embolic?) with residual slurred speech at baseline. Today with worsening slurred speech / fatigue / dysarthria possibly secondary to dehydration and / or UTI vrs progression of stroke. Slurred speech improved after liter of fluid though not back to baseline  -MRI brain w and wo contrast on admission showed progression of ischemia in the superior right insula -no weakness in extremities, no need of PT/OT, speech has improved, passed bedside swallow eval -neurology consulted, input appreciated, asa increased to 325mg  and started plavix, TEE/loop recorder on Monday per neuro recommendation  Dehydation / UTI / AKI. Baseline creatinine 1.06, up to 1.6 today, likely secondary to volume depletion and uti -Ivf, abx, hold lasix until renal function improves -hold ditropan - f/u urine culture -Continue Rocephin  Multiple sclerosis ([place]). No flares in a few years per wife. Not on treatment. -Continue Requip for restless legs  Essential hypertension, controlled. Patient has already taken home am BP meds.   Chronic diastolic CHF (congestive heart failure). Grade I diastolic dysfunction. LVEF 60-65% on echo 3 days ago . No evidence for volume overload.  -Needs  IVF for dehydration -monitor daily wts and I+0 -continue beta blocker, BP meds, asa, statin  COPD (chronic obstructive pulmonary disease) ([place]). Stable.  -continue home proventil and spriva   Hyperlipidemia -continue home statin  Gastroesophageal reflux disease  -continue home PPI  Dehydration with acute kidney injury, dry mucous membranes. . -Received a liter of normal saline, will bolus with another 500 cc and start maintenance fluids -Hold home Lasix  COPD, stable. . -continue home Proventilair, [place]. . Stable  -Continue home effexor, abilify  H/o bladder cancer and bladder spasm: close followed with urology at [name].  DVT prophylaxis: Lovenox Code Status: DNR Family Communication: patient Disposition Plan: Discharge home after tee/loop record placement, likely early next week  Consults called: neurology/cardiology contacted by neurology    Procedures: tee/loop record placement on 6/19  Antibiotics:  rocephin   Objective: BP 174/78 mmHg  Pulse 72  Temp(Src) 98.5 F (36.9 C) (Oral)  Resp 18  Ht 6' (1.829 m)  Wt 99.791 kg (220 lb)  BMI 29.83 kg/m2  SpO2 95% No intake or output data in the 24 hours ending 06/22/15 0913 Filed Weights   06/20/15 0946  Weight: 99.791 kg (220 lb)    Exam:   General:  NAD, speech largely clear, no repetition  Cardiovascular: RRR  Respiratory: CTABL  Abdomen: Soft/ND/NT, positive BS  Musculoskeletal: No Edema  Neuro: aaox3, slight dysarthria, no weakness in extremities  Data Reviewed: Basic Metabolic Panel:  Recent Labs Lab 06/15/15 1010 06/17/15 0347 06/20/15 0855 06/20/15 0940 06/20/15 1033 06/21/15 0540 06/22/15 0438  NA 137 135  --  138 140 141 141  K 4.6 3.9  --  4.8 4.6 4.2 4.2  CL 106 101  --  105 103 109 108  CO2 21* 25  --  25  --  26 26  GLUCOSE 119* 103* 75 75 68 96 91  BUN 25* 19  --  28* 34* 17 11  CREATININE 1.20 1.06  --  1.64* 1.60*  1.00 0.90  CALCIUM 9.1 9.3  --  9.1  --  8.7* 8.8*   Liver Function Tests:  Recent Labs Lab 06/15/15 1010 06/20/15 0940  AST 24 24  ALT 23 21  ALKPHOS 72 72  BILITOT 0.6 0.3  PROT 7.2 6.7  ALBUMIN 4.0 3.9   No results for input(s): LIPASE, AMYLASE in the last 168 hours.  Recent Labs Lab 06/15/15 1037  AMMONIA 38*   CBC:  Recent Labs Lab 06/15/15 1010 06/20/15 0940 06/20/15 1033 06/21/15 0540 06/22/15 0438  WBC 9.3 11.1*  --  7.8 7.4  NEUTROABS 5.2 7.0  --   --   --   HGB 14.3 12.7* 14.3 12.0* 12.6*  HCT 45.4 41.5 42.0 39.3 40.7  MCV 94.8 95.6  --  95.6 94.4  PLT 373 319  --  280 285   Cardiac Enzymes:   No results for input(s): CKTOTAL, CKMB, CKMBINDEX, TROPONINI in the last 168 hours. BNP (last 3 results) No results for input(s): BNP in the last 8760 hours.  ProBNP (last 3 results) No results for input(s): PROBNP in the last 8760 hours.  CBG:  Recent Labs Lab 06/15/15 0956 06/20/15 1023  GLUCAP 123* 80    Recent Results (from the past 240 hour(s))  Urine culture     Status: Abnormal   Collection Time: 06/20/15 11:40 AM  Result Value Ref Range Status   Specimen Description URINE, CATHETERIZED  Final   Special Requests NONE  Final   Culture >=100,000 COLONIES/mL KLEBSIELLA OXYTOCA (A)  Final   Report Status 06/22/2015 FINAL  Final   Organism ID, Bacteria KLEBSIELLA OXYTOCA (A)  Final      Susceptibility   Klebsiella oxytoca - MIC*    AMPICILLIN >=32 RESISTANT Resistant     CEFAZOLIN 8 SENSITIVE Sensitive     CEFTRIAXONE <=1 SENSITIVE Sensitive     CIPROFLOXACIN <=0.25 SENSITIVE Sensitive     GENTAMICIN <=1 SENSITIVE Sensitive     IMIPENEM <=0.25 SENSITIVE Sensitive     NITROFURANTOIN <=16 SENSITIVE Sensitive     TRIMETH/SULFA <=20 SENSITIVE Sensitive     AMPICILLIN/SULBACTAM 8 SENSITIVE Sensitive     PIP/TAZO <=4 SENSITIVE Sensitive     * >=100,000 COLONIES/mL KLEBSIELLA OXYTOCA     Studies: No results found.  Scheduled Meds: .  ARIPiprazole  5 mg Oral Daily  . aspirin  325 mg Oral Daily  . atorvastatin  20 mg Oral q1800  . cefTRIAXone (ROCEPHIN)  IV  1 g Intravenous Q24H  . clopidogrel  75 mg Oral Daily  . enoxaparin (LOVENOX) injection  40 mg Subcutaneous Q24H  . pantoprazole  40 mg Oral Daily  . rOPINIRole  1 mg Oral QHS  . tiotropium  18 mcg Inhalation Daily  . venlafaxine XR  150 mg Oral BID    Continuous Infusions: . sodium chloride 75 mL/hr at 06/21/15 1729     Time spent: 91mins  Elim Economou MD, PhD  [facility] Pager [phone]. If 7PM-7AM, please contact night-coverage at [email or web address], password [facility] 06/22/2015, 9:13 AM  LOS: 1 day

## 2015-06-23 NOTE — Progress Notes (Signed)
Patient returned from TEE and loop recorder placement. VSS. Dressing CDI, no other distress noted. Will continue to monitor.  Ave Filter, RN

## 2015-06-23 NOTE — H&P (View-Only) (Signed)
ELECTROPHYSIOLOGY CONSULT NOTE  Patient ID: [name] [name] MRN: [id number], DOB/AGE: 01-18-1940   Admit date: 06/20/2015 Date of Consult: 06/23/2015  Primary Physician: [name], MD Primary Cardiologist: Aundra Dubin Reason for Consultation: Cryptogenic stroke; recommendations regarding Implantable Loop Recorder  History of Present Illness [name] [name] was admitted on 06/20/2015 with weakness, fatigue, and worsening dysarthria. He was recently admitted with R MCA infarct felt to be embolic 2/2 unknown source and discharged with planned outpatient TEE and LINQ.   Imaging demonstrated stable R insula and right frontal MCA infarct felt to be embolic 2/2 unknown source.  He has undergone workup for stroke including echocardiogram and carotid dopplers.  The patient has been monitored on telemetry which has demonstrated sinus rhythm with no arrhythmias.  Inpatient stroke work-up is to be completed with a TEE.   Echocardiogram 06/16/15 demonstrated EF [id number], grade 1 diastolic dysfunction, LA 32.    Prior to admission, the patient denies chest pain, shortness of breath, dizziness, palpitations, or syncope.  They are recovering from their stroke with plans to return home at discharge.  EP has been asked to evaluate for placement of an implantable loop recorder to monitor for atrial fibrillation.  Past Medical History  Diagnosis Date  . MS (multiple sclerosis) ([place])   . Arthritis     s/p TKR  . Right bundle branch block   . Brugada syndrome     Possible Type II Brugada ECG pattern. No family history of SCD, no syncope, no tachypalpitations.  Marland Kitchen COPD (chronic obstructive pulmonary disease) ([place])     Prior heavy smoker. PFTs (3/11): FVC 87%, FEV1 73%, ratio 0.57, DLCO 75%, TLC 121%. Moderate obstructive defect. PFTs (9/15): Only minimal obstruction, sugPrior heavy smoker. PFTs (3/11): FVC 87%, FEV1 73%, ratio 0.57, DLCO 75%, TLC 121%. Moderate obstructive defect. PFTs (9/15) minimal  obstruction, poss asthma component   . GERD (gastroesophageal reflux disease)   . Depression     with bipolar tendencies  . Urinary retention with incomplete bladder emptying     receives botox injections and treatment for BPH through [facility]  . BPH (benign prostatic hyperplasia)   . CAD (coronary artery disease)     a. LHC 1/15 - mid LCx 50 and 60%, proximal RCA 50%  . HLD (hyperlipidemia)   . HTN (hypertension)   . Chronic diastolic CHF (congestive heart failure) ([place])   . LVH (left ventricular hypertrophy)     a. Echo 12/14 Inferior and distal septal HK, mild LVH, EF 50-55%, mild MR, mild LAE  . Bladder cancer (HCC)     [facility], Ta low grade papillary urothileal carcinoma  . DOE (dyspnea on exertion)     a. Myoview 8/09- EF 57%, no ischemia // b. Myoview (3/11) EF 68%, diaphragmatic attenuation, no ischemia //  c Echo (4/11) EF 0000000, mild diastolic dysfunction, PASP 38 mmHg // d. PFTs 9/15 minimal obstruction  /  e. CT negative for ILD  . Mitral regurgitation     Echo (4/11) with PISA ERO 0.3 cm^2 and regurgitant volume 41 mL (moderate MR). Echo (12/14) with mild MR.   Marland Kitchen History of Doppler ultrasound     Carotid US 3/11 negative for significant stenosis  . Mild dementia   . Low testosterone   . CKD (chronic kidney disease)   . Lung nodule     a. CT in 2015 >> PET in 12/15 not sugg of malignancy   . Thoracic aortic aneurysm (HCC)     4.1 cm 2017  .  Stroke [facility])      Surgical History:  Past Surgical History  Procedure Laterality Date  . Total knee arthroplasty Right   . Transurethral resection of prostate    . Tonsillectomy    . Appendectomy    . Rotator cuff repair    . Hemorrhoid surgery    . Left heart catheterization with coronary angiogram N/A 01/05/2013    Procedure: LEFT HEART CATHETERIZATION WITH CORONARY ANGIOGRAM;  Surgeon: Larey Dresser, MD;  Location: [facility] CATH LAB;  Service: Cardiovascular;  Laterality: N/A;     Prescriptions prior to admission  Medication Sig  Dispense Refill Last Dose  . amLODipine (NORVASC) 5 MG tablet Take 5 mg by mouth daily.   06/20/2015  . ARIPiprazole (ABILIFY) 5 MG tablet TAKE 1 TABLET DAILY 90 tablet 2 06/20/2015  . aspirin EC 81 MG tablet Take 1 tablet (81 mg total) by mouth daily. 30 tablet 0 06/20/2015  . atorvastatin (LIPITOR) 20 MG tablet Take 20 mg by mouth daily.   06/19/2015 at Unknown time  . bisoprolol (ZEBETA) 5 MG tablet TAKE ONE-HALF (1/2) TABLET BY MOUTH ONCE  DAILY   06/20/2015 at 0500  . furosemide (LASIX) 40 MG tablet Take 1 tablet (40 mg total) by mouth daily as needed (swelling). Please keep upcoming appointment for further refills 180 tablet 0 06/20/2015  . HYDROcodone-acetaminophen (NORCO) 10-325 MG tablet Take 1 tablet by mouth every 6 (six) hours as needed for moderate pain or severe pain. 30 tablet 0 2-3 days  . omeprazole (PRILOSEC) 20 MG capsule Take 1 capsule (20 mg total) by mouth daily. 90 capsule 3 06/20/2015  . oxybutynin (DITROPAN-XL) 5 MG 24 hr tablet Take 5 mg by mouth at bedtime.   06/19/2015 at Unknown time  . predniSONE (DELTASONE) 10 MG tablet Take 10 mg by mouth daily as needed (FOR PAIN).   PRN  . PROAIR HFA 108 (90 BASE) MCG/ACT inhaler USE 1 TO 2 INHALATIONS EVERY 6 HOURS AS NEEDED FOR WHEEZING OR SHORTNESS OF BREATH 25.5 g 4 06/20/2015  . rOPINIRole (REQUIP) 1 MG tablet Take 1 mg by mouth at bedtime.    06/19/2015 at Unknown time  . SPIRIVA HANDIHALER 18 MCG inhalation capsule INHALE THE CONTENTS OF 1 CAPSULE WITH 2         INHALATIONS ONCE DAILY AS DIRECTED 1 capsule 11 06/19/2015 at Unknown time  . venlafaxine XR (EFFEXOR-XR) 150 MG 24 hr capsule Take 150 mg by mouth 2 (two) times daily.    06/20/2015  . vitamin B-12 (CYANOCOBALAMIN) 1000 MCG tablet Take 1,000 mcg by mouth daily.   06/20/2015    Inpatient Medications:  . ARIPiprazole  5 mg Oral Daily  . atorvastatin  20 mg Oral q1800  . cefTRIAXone (ROCEPHIN)  IV  1 g Intravenous Q24H  . clopidogrel  75 mg Oral Daily  . enoxaparin (LOVENOX)  injection  40 mg Subcutaneous Q24H  . pantoprazole  40 mg Oral Daily  . rOPINIRole  1 mg Oral QHS  . tiotropium  18 mcg Inhalation Daily  . venlafaxine XR  150 mg Oral BID    Allergies:  Allergies  Allergen Reactions  . Acetylcholine     unknown  . Alcohol-Sulfur [Sulfur]     unknown  . Amitriptyline     unknown  . Bupivacaine     unknown  . Clomipramine Hcl     unknown  . Cocaine     unknown  . Desipramine     unknown  . Ergonovine  unknown  . Flecainide     unknown  . Lithium     unknown  . Loxapine     unknown  . Nortriptyline     unknown  . Oxcarbazepine     unknown  . Procainamide     unknown  . Procaine     unknwon  . Propafenone     unknown  . Propofol     unknown  . Trifluoperazine     unknown    Social History   Social History  . Marital Status: Married    Spouse Name: N/A  . Number of Children: N/A  . Years of Education: N/A   Occupational History  . retired    Social History Main Topics  . Smoking status: Former Smoker -- 2.00 packs/day for 40 years    Types: Cigarettes    Quit date: 01/04/1993  . Smokeless tobacco: Former Systems developer    Types: Chew  . Alcohol Use: No     Comment: 6 pack/day - 06/20/15 Pt states wuit drinking in the 1970's  . Drug Use: No  . Sexual Activity: Not on file   Other Topics Concern  . Not on file   Social History Narrative   Retired from the [employer] after 20 years   [place] Veteran     Family History  Problem Relation Age of Onset  . Stroke Mother   . Stroke Father   . Hypertension Sister   . Kidney cancer Brother   . Arthritis Brother   . Other Brother     knee problems, Bil TKR  . Hypertension      family history      Review of Systems: All other systems reviewed and are otherwise negative except as noted above.  Physical Exam: Filed Vitals:   06/22/15 2041 06/23/15 0138 06/23/15 0541 06/23/15 0914  BP: 179/81 164/82 168/93   Pulse: 85 75 67   Temp: 97.7 F (36.5 C) 97.7 F  (36.5 C) 97.8 F (36.6 C)   TempSrc: Oral Oral Oral   Resp: 20 20 18    Height:      Weight:      SpO2: 95% 96% 96% 96%    GEN- The patient is well appearing, alert and oriented x 3 today.   Head- normocephalic, atraumatic Eyes-  Sclera clear, conjunctiva pink Ears- hearing intact Oropharynx- clear Neck- supple Lungs- Clear to ausculation bilaterally, normal work of breathing Heart- Regular rate and rhythm, no murmurs, rubs or gallops  GI- soft, NT, ND, + BS Extremities- no clubbing, cyanosis, or edema MS- no significant deformity or atrophy Skin- no rash or lesion Psych- euthymic mood, full affect   Labs:   Lab Results  Component Value Date   WBC 7.4 06/22/2015   HGB 12.6* 06/22/2015   HCT 40.7 06/22/2015   MCV 94.4 06/22/2015   PLT 285 06/22/2015    Recent Labs Lab 06/20/15 0940  06/22/15 0438  NA 138  < > 141  K 4.8  < > 4.2  CL 105  < > 108  CO2 25  < > 26  BUN 28*  < > 11  CREATININE 1.64*  < > 0.90  CALCIUM 9.1  < > 8.8*  PROT 6.7  --   --   BILITOT 0.3  --   --   ALKPHOS 72  --   --   ALT 21  --   --   AST 24  --   --   GLUCOSE  75  < > 91  < > = values in this interval not displayed.   Radiology/Studies: Ct Angio Head W/cm &/or Wo Cm 06/17/2015  CLINICAL DATA:  Follow-up stroke. History of multiple sclerosis, hypertension, hyperlipidemia, cancer. EXAM: CT ANGIOGRAPHY HEAD TECHNIQUE: Multidetector CT imaging of the head was performed using the standard protocol during bolus administration of intravenous contrast. Multiplanar CT image reconstructions and MIPs were obtained to evaluate the vascular anatomy. CONTRAST:  50 cc Isovue 370 COMPARISON:  None. FINDINGS: CT HEAD INTRACRANIAL CONTENTS: The ventricles and sulci are normal for age. No intraparenchymal hemorrhage, mass effect nor midline shift. Patchy supratentorial white matter hypodensities. Focal loss of RIGHT frontal gray-white matter differentiation corresponding to known acute stroke. No abnormal  extra-axial fluid collections. Basal cisterns are patent. Mild calcific atherosclerosis of the carotid siphons. No abnormal intracranial enhancement. ORBITS: The included ocular globes and orbital contents are non-suspicious. SINUSES: The mastoid aircells and included paranasal sinuses are well-aerated. SKULL/SOFT TISSUES: No skull fracture. No significant soft tissue swelling. Severe temporomandibular osteoarthrosis. CTA HEAD ANTERIOR CIRCULATION: Normal appearance of the cervical internal carotid arteries, petrous, cavernous and supra clinoid internal carotid arteries. Widely patent anterior communicating artery. Normal appearance of the anterior and middle cerebral arteries. POSTERIOR CIRCULATION: Codominant vertebral arteries with normal appearance of the vertebral arteries, vertebrobasilar junction and basilar artery, as well as main branch vessels. Normal appearance of the posterior cerebral arteries. No large vessel occlusion, hemodynamically significant stenosis, dissection, luminal irregularity, contrast extravasation or aneurysm within the anterior nor posterior circulation. IMPRESSION: CT HEAD: Evolving small RIGHT frontal/ MCA territory infarct. Moderate to severe white matter changes better characterized on recent MRI of the brain. CTA HEAD: Negative. Electronically Signed   By: Elon Alas M.D.   On: 06/17/2015 01:59   Dg Chest 2 View 06/20/2015  CLINICAL DATA:  Altered mental status EXAM: CHEST  2 VIEW COMPARISON:  06/05/2015 FINDINGS: Cardiac shadow is within normal limits. The lungs demonstrate minimal platelike atelectasis on the left. The previously seen nodules in the right lung base are not well appreciated on this exam. No effusion is noted. No acute bony abnormality is seen. IMPRESSION: Stable changes from recent CT.  No acute abnormality noted. Electronically Signed   By: Inez Catalina M.D.   On: 06/20/2015 11:03   12-lead ECG sinus rhythm, Brugada pattern, stable from previous All  prior EKG's in EPIC reviewed with no documented atrial fibrillation  Telemetry sinus rhythm  Assessment and Plan:  1. Cryptogenic stroke The patient presents with cryptogenic stroke.  The patient has a TEE planned for this AM.  I spoke at length with the patient about monitoring for afib with either a 30 day event monitor or an implantable loop recorder.  Risks, benefits, and alteratives to implantable loop recorder were discussed with the patient today.   At this time, the patient is very clear in their decision to proceed with implantable loop recorder.   Wound care was reviewed with the patient (keep incision clean and dry for 3 days).  Wound check scheduled for 07/07/15 at 11:30AM   Please call with questions.   Chanetta Marshall, NP 06/23/2015 9:16 AM   Seen and examined In addition to the concern re Cryptogenic Stroke we have the fatigue and presyncope assoc with effort.  No assoc chest pain or SOB  and it had been months since he had walked this far.  Known CAD but not obstructive.  If TEE neg will proceed with ILR implant to look for AFib as  cause

## 2015-06-23 NOTE — Progress Notes (Signed)
STROKE TEAM PROGRESS NOTE   SUBJECTIVE (INTERVAL HISTORY) No family at bedside. Pt is pending TEE. No acute issue overnight. No complains.   OBJECTIVE Temp:  [97.7 F (36.5 C)-98.5 F (36.9 C)] 98.2 F (36.8 C) (06/19 1002) Pulse Rate:  [67-85] 80 (06/19 1002) Cardiac Rhythm:  [-] Heart block (06/18 1900) Resp:  [18-20] 20 (06/19 1002) BP: (142-185)/(74-98) 142/74 mmHg (06/19 1002) SpO2:  [95 %-99 %] 98 % (06/19 1002)  CBC:   Recent Labs Lab 06/20/15 0940  06/21/15 0540 06/22/15 0438  WBC 11.1*  --  7.8 7.4  NEUTROABS 7.0  --   --   --   HGB 12.7*  < > 12.0* 12.6*  HCT 41.5  < > 39.3 40.7  MCV 95.6  --  95.6 94.4  PLT 319  --  280 285  < > = values in this interval not displayed.  Basic Metabolic Panel:   Recent Labs Lab 06/21/15 0540 06/22/15 0438  NA 141 141  K 4.2 4.2  CL 109 108  CO2 26 26  GLUCOSE 96 91  BUN 17 11  CREATININE 1.00 0.90  CALCIUM 8.7* 8.8*    Lipid Panel:     Component Value Date/Time   CHOL 140 06/21/2015 0534   TRIG 239* 06/21/2015 0534   HDL 33* 06/21/2015 0534   CHOLHDL 4.2 06/21/2015 0534   VLDL 48* 06/21/2015 0534   LDLCALC 59 06/21/2015 0534   HgbA1c:  Lab Results  Component Value Date   HGBA1C 6.4* 06/16/2015   Urine Drug Screen:     Component Value Date/Time   LABOPIA POSITIVE* 06/15/2015 1907   COCAINSCRNUR NONE DETECTED 06/15/2015 1907   LABBENZ NONE DETECTED 06/15/2015 1907   AMPHETMU NONE DETECTED 06/15/2015 1907   THCU NONE DETECTED 06/15/2015 1907   LABBARB NONE DETECTED 06/15/2015 1907      IMAGING I have personally reviewed the radiological images below and agree with the radiology interpretations. Blue text is Dr. Phoebe Sharps interpretation  Ct Head Wo Contrast 06/20/2015   Stable atrophic and ischemic changes. No acute abnormality is noted.   Mr Kizzie Fantasia Contrast 06/20/2015   Subcentimeter focus of restricted diffusion in the superior RIGHT insula not present previously, representing progression of  ischemia. Otherwise stable multifocal areas of acute RIGHT MCA infarction. No interval development of large vessel occlusion, or significant mass effect. Mild postcontrast enhancement, consistent with the subacute time course. There is no significant change on DWI comparing with previous MRI. The "new" DWI signal likely the same DWI as before just due to different cut.   CT Angio Head W CM & or Wo CM 06/16/2015 CT HEAD: Evolving small RIGHT frontal/ MCA territory infarct. Moderate to severe white matter changes better characterized on recent MRI of the brain. CTA HEAD: Negative.  2D Echocardiogram 06/16/15 - Left ventricle: The cavity size was normal. There was mild focal basal hypertrophy of the septum. Systolic function was normal. The estimated ejection fraction was in the range of 60% to 65%. Wall motion was normal; there were no regional wall motion abnormalities. Doppler parameters are consistent with abnormal left ventricular relaxation (grade 1 diastolic dysfunction). - Aortic valve: There was mild regurgitation. Impressions: - No cardiac source of emboli was indentified.  Carotid Doppler 06/16/15 There is 1-39% bilateral ICA stenosis. Vertebral artery flow is antegrade.   LE venous doppler - There is no DVT or SVT noted in the bilateral lower extremities.   TEE - normal EF, no SOE, no PFO  PHYSICAL EXAM  General - Well nourished, well developed, in no apparent distress.  Ophthalmologic - Fundi not visualized due to noncooperation.  Cardiovascular - Regular rate and rhythm.  Mental Status -  Level of arousal and orientation to time, place, and person were intact. Language including expression, naming, repetition, comprehension was assessed and found intact. Fund of Knowledge was assessed and was intact.  Cranial Nerves II - XII - II - Visual field intact OU. III, IV, VI - Extraocular movements intact. V - Facial sensation intact bilaterally. VII - Facial movement  intact bilaterally. VIII - Hearing & vestibular intact bilaterally. X - Palate elevates symmetrically. XI - Chin turning & shoulder shrug intact bilaterally. XII - Tongue protrusion intact.  Motor Strength - The patient's strength was normal in all extremities and pronator drift was absent.  Bulk was normal and fasciculations were absent.   Motor Tone - Muscle tone was assessed at the neck and appendages and was normal.  Reflexes - The patient's reflexes were 1+ in all extremities and he had no pathological reflexes.  Sensory - Light touch, temperature/pinprick, vibration and proprioception, and Romberg testing were assessed and were symmetrical.    Coordination - The patient had normal movements in the hands and feet with no ataxia or dysmetria.  Tremor was absent.  Gait and Station - The patient's transfers, posture, gait, station, and turns were observed as normal.   ASSESSMENT/PLAN [name] [name] is a 75 y.o. male with history of MS, previous tobacco use, COPD, depression, recent mechanical fall, coronary artery disease, recent stroke, thoracic aortic aneurysm, history of a lung nodule, chronic kidney disease, mild dementia, congestive heart failure, hypertension, hyperlipidemia, and Brugada syndrome presenting with diffuse weakness, dysarthria, and gait instability. He did not receive IV t-PA due to recent stroke.  Pre-syncope with UTI vs. ? Urosepsis  Pt was working outside under sun, humid  Lightheaded and SOB with generalized weakness - wanted to sit down  Took lasix prior to the episode  Did not check BP at the time of episode  Dehydration at time of admission  Worsening dysarthria from recent stroke  UTI  UA WBC TNTC  On rocephine - Day # 2  Urine Cx > 100,000 col of Klebsiella - sensitive to Rocephin  Recent stroke:  R frontal MCA territory infarct - embolic - unknown source.  Resultant  Deficit resolved  MRI - RIGHT insula and right frontal MCA  territory infarct, same as last admission.  CTA Head 06/16/2015 - negative  Carotid Doppler - 06/16/2015 - Bilateral: mild soft plaque CCA and origin ICA. 1-39% ICA. Vert. artery flow is antegrade.  2D Echo - 06/16/2015 - EF 60-65%. No cardiac source of emboli was identified.  LE venous doppler negative  TEE - negative, no PFO  Loop placed  LDL - 59  HgbA1c - 6.4  VTE prophylaxis - Lovenox Diet NPO time specified Except for: Sips with Meds  aspirin 81 mg daily prior to admission, now on clopidogrel 75 mg daily. Continue plavix on discharge.  Patient counseled to be compliant with his antithrombotic medications  Ongoing aggressive stroke risk factor management  Therapy recommendations:  No PT or OT follow-up recommended.  Disposition:  Return home  Hypertension  Stable  BP goal normotensive  Avoid hypotension  Hyperlipidemia  Home meds: Lipitor 20 mg daily resumed in hospital  LDL 59, goal < 70  Continue statin at discharge  Other Stroke Risk Factors  Advanced age  Cigarette smoker - quit  smoking 1995  Obesity, Body mass index is 29.83 kg/(m^2)., recommend weight loss, diet and exercise as appropriate   Hx stroke/TIA  Family hx stroke (mother)  Coronary artery disease  Thoracic aortic aneurysm 4.1 cm  Chronic diastolic CHF  Other Active Problems  Dehydration / UTI / AKI  Multiple sclerosis  COPD  GERD  depression  Hospital day # 1  Neurology will sign off. Please call with questions. Pt will follow up with Dr. Erlinda Hong at [facility] on 08/14/15. Thanks for the consult.   Rosalin Hawking, MD PhD Stroke Neurology 06/23/2015 10:47 AM    To contact Stroke Continuity provider, please refer to [email or web address] After hours, contact General Neurology

## 2015-06-23 NOTE — Consult Note (Signed)
ELECTROPHYSIOLOGY CONSULT NOTE  Patient ID: [name] [name] MRN: [id number], DOB/AGE: 1940-03-13   Admit date: 06/20/2015 Date of Consult: 06/23/2015  Primary Physician: [name], MD Primary Cardiologist: Aundra Dubin Reason for Consultation: Cryptogenic stroke; recommendations regarding Implantable Loop Recorder  History of Present Illness [name] [name] was admitted on 06/20/2015 with weakness, fatigue, and worsening dysarthria. He was recently admitted with R MCA infarct felt to be embolic 2/2 unknown source and discharged with planned outpatient TEE and LINQ.   Imaging demonstrated stable R insula and right frontal MCA infarct felt to be embolic 2/2 unknown source.  He has undergone workup for stroke including echocardiogram and carotid dopplers.  The patient has been monitored on telemetry which has demonstrated sinus rhythm with no arrhythmias.  Inpatient stroke work-up is to be completed with a TEE.   Echocardiogram 06/16/15 demonstrated EF [id number], grade 1 diastolic dysfunction, LA 32.    Prior to admission, the patient denies chest pain, shortness of breath, dizziness, palpitations, or syncope.  They are recovering from their stroke with plans to return home at discharge.  EP has been asked to evaluate for placement of an implantable loop recorder to monitor for atrial fibrillation.  Past Medical History  Diagnosis Date  . MS (multiple sclerosis) ([place])   . Arthritis     s/p TKR  . Right bundle branch block   . Brugada syndrome     Possible Type II Brugada ECG pattern. No family history of SCD, no syncope, no tachypalpitations.  Marland Kitchen COPD (chronic obstructive pulmonary disease) ([place])     Prior heavy smoker. PFTs (3/11): FVC 87%, FEV1 73%, ratio 0.57, DLCO 75%, TLC 121%. Moderate obstructive defect. PFTs (9/15): Only minimal obstruction, sugPrior heavy smoker. PFTs (3/11): FVC 87%, FEV1 73%, ratio 0.57, DLCO 75%, TLC 121%. Moderate obstructive defect. PFTs (9/15) minimal  obstruction, poss asthma component   . GERD (gastroesophageal reflux disease)   . Depression     with bipolar tendencies  . Urinary retention with incomplete bladder emptying     receives botox injections and treatment for BPH through [facility]  . BPH (benign prostatic hyperplasia)   . CAD (coronary artery disease)     a. LHC 1/15 - mid LCx 50 and 60%, proximal RCA 50%  . HLD (hyperlipidemia)   . HTN (hypertension)   . Chronic diastolic CHF (congestive heart failure) ([place])   . LVH (left ventricular hypertrophy)     a. Echo 12/14 Inferior and distal septal HK, mild LVH, EF 50-55%, mild MR, mild LAE  . Bladder cancer (HCC)     [facility], Ta low grade papillary urothileal carcinoma  . DOE (dyspnea on exertion)     a. Myoview 8/09- EF 57%, no ischemia // b. Myoview (3/11) EF 68%, diaphragmatic attenuation, no ischemia //  c Echo (4/11) EF 0000000, mild diastolic dysfunction, PASP 38 mmHg // d. PFTs 9/15 minimal obstruction  /  e. CT negative for ILD  . Mitral regurgitation     Echo (4/11) with PISA ERO 0.3 cm^2 and regurgitant volume 41 mL (moderate MR). Echo (12/14) with mild MR.   Marland Kitchen History of Doppler ultrasound     Carotid US 3/11 negative for significant stenosis  . Mild dementia   . Low testosterone   . CKD (chronic kidney disease)   . Lung nodule     a. CT in 2015 >> PET in 12/15 not sugg of malignancy   . Thoracic aortic aneurysm (HCC)     4.1 cm 2017  .  Stroke [facility]      Surgical History:  Past Surgical History  Procedure Laterality Date  . Total knee arthroplasty Right   . Transurethral resection of prostate    . Tonsillectomy    . Appendectomy    . Rotator cuff repair    . Hemorrhoid surgery    . Left heart catheterization with coronary angiogram N/A 01/05/2013    Procedure: LEFT HEART CATHETERIZATION WITH CORONARY ANGIOGRAM;  Surgeon: Larey Dresser, MD;  Location: [facility] CATH LAB;  Service: Cardiovascular;  Laterality: N/A;     Prescriptions prior to admission  Medication Sig  Dispense Refill Last Dose  . amLODipine (NORVASC) 5 MG tablet Take 5 mg by mouth daily.   06/20/2015  . ARIPiprazole (ABILIFY) 5 MG tablet TAKE 1 TABLET DAILY 90 tablet 2 06/20/2015  . aspirin EC 81 MG tablet Take 1 tablet (81 mg total) by mouth daily. 30 tablet 0 06/20/2015  . atorvastatin (LIPITOR) 20 MG tablet Take 20 mg by mouth daily.   06/19/2015 at Unknown time  . bisoprolol (ZEBETA) 5 MG tablet TAKE ONE-HALF (1/2) TABLET BY MOUTH ONCE  DAILY   06/20/2015 at 0500  . furosemide (LASIX) 40 MG tablet Take 1 tablet (40 mg total) by mouth daily as needed (swelling). Please keep upcoming appointment for further refills 180 tablet 0 06/20/2015  . HYDROcodone-acetaminophen (NORCO) 10-325 MG tablet Take 1 tablet by mouth every 6 (six) hours as needed for moderate pain or severe pain. 30 tablet 0 2-3 days  . omeprazole (PRILOSEC) 20 MG capsule Take 1 capsule (20 mg total) by mouth daily. 90 capsule 3 06/20/2015  . oxybutynin (DITROPAN-XL) 5 MG 24 hr tablet Take 5 mg by mouth at bedtime.   06/19/2015 at Unknown time  . predniSONE (DELTASONE) 10 MG tablet Take 10 mg by mouth daily as needed (FOR PAIN).   PRN  . PROAIR HFA 108 (90 BASE) MCG/ACT inhaler USE 1 TO 2 INHALATIONS EVERY 6 HOURS AS NEEDED FOR WHEEZING OR SHORTNESS OF BREATH 25.5 g 4 06/20/2015  . rOPINIRole (REQUIP) 1 MG tablet Take 1 mg by mouth at bedtime.    06/19/2015 at Unknown time  . SPIRIVA HANDIHALER 18 MCG inhalation capsule INHALE THE CONTENTS OF 1 CAPSULE WITH 2         INHALATIONS ONCE DAILY AS DIRECTED 1 capsule 11 06/19/2015 at Unknown time  . venlafaxine XR (EFFEXOR-XR) 150 MG 24 hr capsule Take 150 mg by mouth 2 (two) times daily.    06/20/2015  . vitamin B-12 (CYANOCOBALAMIN) 1000 MCG tablet Take 1,000 mcg by mouth daily.   06/20/2015    Inpatient Medications:  . ARIPiprazole  5 mg Oral Daily  . atorvastatin  20 mg Oral q1800  . cefTRIAXone (ROCEPHIN)  IV  1 g Intravenous Q24H  . clopidogrel  75 mg Oral Daily  . enoxaparin (LOVENOX)  injection  40 mg Subcutaneous Q24H  . pantoprazole  40 mg Oral Daily  . rOPINIRole  1 mg Oral QHS  . tiotropium  18 mcg Inhalation Daily  . venlafaxine XR  150 mg Oral BID    Allergies:  Allergies  Allergen Reactions  . Acetylcholine     unknown  . Alcohol-Sulfur [Sulfur]     unknown  . Amitriptyline     unknown  . Bupivacaine     unknown  . Clomipramine Hcl     unknown  . Cocaine     unknown  . Desipramine     unknown  . Ergonovine  unknown  . Flecainide     unknown  . Lithium     unknown  . Loxapine     unknown  . Nortriptyline     unknown  . Oxcarbazepine     unknown  . Procainamide     unknown  . Procaine     unknwon  . Propafenone     unknown  . Propofol     unknown  . Trifluoperazine     unknown    Social History   Social History  . Marital Status: Married    Spouse Name: N/A  . Number of Children: N/A  . Years of Education: N/A   Occupational History  . retired    Social History Main Topics  . Smoking status: Former Smoker -- 2.00 packs/day for 40 years    Types: Cigarettes    Quit date: 01/04/1993  . Smokeless tobacco: Former Systems developer    Types: Chew  . Alcohol Use: No     Comment: 6 pack/day - 06/20/15 Pt states wuit drinking in the 1970's  . Drug Use: No  . Sexual Activity: Not on file   Other Topics Concern  . Not on file   Social History Narrative   Retired from the [employer] after 20 years   [place] Veteran     Family History  Problem Relation Age of Onset  . Stroke Mother   . Stroke Father   . Hypertension Sister   . Kidney cancer Brother   . Arthritis Brother   . Other Brother     knee problems, Bil TKR  . Hypertension      family history      Review of Systems: All other systems reviewed and are otherwise negative except as noted above.  Physical Exam: Filed Vitals:   06/22/15 2041 06/23/15 0138 06/23/15 0541 06/23/15 0914  BP: 179/81 164/82 168/93   Pulse: 85 75 67   Temp: 97.7 F (36.5 C) 97.7 F  (36.5 C) 97.8 F (36.6 C)   TempSrc: Oral Oral Oral   Resp: 20 20 18    Height:      Weight:      SpO2: 95% 96% 96% 96%    GEN- The patient is well appearing, alert and oriented x 3 today.   Head- normocephalic, atraumatic Eyes-  Sclera clear, conjunctiva pink Ears- hearing intact Oropharynx- clear Neck- supple Lungs- Clear to ausculation bilaterally, normal work of breathing Heart- Regular rate and rhythm, no murmurs, rubs or gallops  GI- soft, NT, ND, + BS Extremities- no clubbing, cyanosis, or edema MS- no significant deformity or atrophy Skin- no rash or lesion Psych- euthymic mood, full affect   Labs:   Lab Results  Component Value Date   WBC 7.4 06/22/2015   HGB 12.6* 06/22/2015   HCT 40.7 06/22/2015   MCV 94.4 06/22/2015   PLT 285 06/22/2015    Recent Labs Lab 06/20/15 0940  06/22/15 0438  NA 138  < > 141  K 4.8  < > 4.2  CL 105  < > 108  CO2 25  < > 26  BUN 28*  < > 11  CREATININE 1.64*  < > 0.90  CALCIUM 9.1  < > 8.8*  PROT 6.7  --   --   BILITOT 0.3  --   --   ALKPHOS 72  --   --   ALT 21  --   --   AST 24  --   --   GLUCOSE  75  < > 91  < > = values in this interval not displayed.   Radiology/Studies: Ct Angio Head W/cm &/or Wo Cm 06/17/2015  CLINICAL DATA:  Follow-up stroke. History of multiple sclerosis, hypertension, hyperlipidemia, cancer. EXAM: CT ANGIOGRAPHY HEAD TECHNIQUE: Multidetector CT imaging of the head was performed using the standard protocol during bolus administration of intravenous contrast. Multiplanar CT image reconstructions and MIPs were obtained to evaluate the vascular anatomy. CONTRAST:  50 cc Isovue 370 COMPARISON:  None. FINDINGS: CT HEAD INTRACRANIAL CONTENTS: The ventricles and sulci are normal for age. No intraparenchymal hemorrhage, mass effect nor midline shift. Patchy supratentorial white matter hypodensities. Focal loss of RIGHT frontal gray-white matter differentiation corresponding to known acute stroke. No abnormal  extra-axial fluid collections. Basal cisterns are patent. Mild calcific atherosclerosis of the carotid siphons. No abnormal intracranial enhancement. ORBITS: The included ocular globes and orbital contents are non-suspicious. SINUSES: The mastoid aircells and included paranasal sinuses are well-aerated. SKULL/SOFT TISSUES: No skull fracture. No significant soft tissue swelling. Severe temporomandibular osteoarthrosis. CTA HEAD ANTERIOR CIRCULATION: Normal appearance of the cervical internal carotid arteries, petrous, cavernous and supra clinoid internal carotid arteries. Widely patent anterior communicating artery. Normal appearance of the anterior and middle cerebral arteries. POSTERIOR CIRCULATION: Codominant vertebral arteries with normal appearance of the vertebral arteries, vertebrobasilar junction and basilar artery, as well as main branch vessels. Normal appearance of the posterior cerebral arteries. No large vessel occlusion, hemodynamically significant stenosis, dissection, luminal irregularity, contrast extravasation or aneurysm within the anterior nor posterior circulation. IMPRESSION: CT HEAD: Evolving small RIGHT frontal/ MCA territory infarct. Moderate to severe white matter changes better characterized on recent MRI of the brain. CTA HEAD: Negative. Electronically Signed   By: Elon Alas M.D.   On: 06/17/2015 01:59   Dg Chest 2 View 06/20/2015  CLINICAL DATA:  Altered mental status EXAM: CHEST  2 VIEW COMPARISON:  06/05/2015 FINDINGS: Cardiac shadow is within normal limits. The lungs demonstrate minimal platelike atelectasis on the left. The previously seen nodules in the right lung base are not well appreciated on this exam. No effusion is noted. No acute bony abnormality is seen. IMPRESSION: Stable changes from recent CT.  No acute abnormality noted. Electronically Signed   By: Inez Catalina M.D.   On: 06/20/2015 11:03   12-lead ECG sinus rhythm, Brugada pattern, stable from previous All  prior EKG's in EPIC reviewed with no documented atrial fibrillation  Telemetry sinus rhythm  Assessment and Plan:  1. Cryptogenic stroke The patient presents with cryptogenic stroke.  The patient has a TEE planned for this AM.  I spoke at length with the patient about monitoring for afib with either a 30 day event monitor or an implantable loop recorder.  Risks, benefits, and alteratives to implantable loop recorder were discussed with the patient today.   At this time, the patient is very clear in their decision to proceed with implantable loop recorder.   Wound care was reviewed with the patient (keep incision clean and dry for 3 days).  Wound check scheduled for 07/07/15 at 11:30AM   Please call with questions.   Chanetta Marshall, NP 06/23/2015 9:16 AM   Seen and examined In addition to the concern re Cryptogenic Stroke we have the fatigue and presyncope assoc with effort.  No assoc chest pain or SOB  and it had been months since he had walked this far.  Known CAD but not obstructive.  If TEE neg will proceed with ILR implant to look for AFib as  cause

## 2015-06-23 NOTE — Care Management Important Message (Signed)
Important Message  Patient Details  Name: [name] [name] MRN: [id number] Date of Birth: [birth year]   Medicare Important Message Given:  Yes    Loann Quill 06/23/2015, 10:23 AM

## 2015-06-23 NOTE — Progress Notes (Signed)
Consent for loop recorder completed and in chart.  Ave Filter, RN

## 2015-06-23 NOTE — Interval H&P Note (Signed)
History and Physical Interval Note:  06/23/2015 2:13 PM  [name] [name]  has presented today for surgery, with the diagnosis of stroke  The various methods of treatment have been discussed with the patient and family. After consideration of risks, benefits and other options for treatment, the patient has consented to  Procedure(s): TRANSESOPHAGEAL ECHOCARDIOGRAM (TEE) (N/A) as a surgical intervention .  The patient's history has been reviewed, patient examined, no change in status, stable for surgery.  I have reviewed the patient's chart and labs.  Questions were answered to the patient's satisfaction.     Dorris Carnes

## 2015-06-23 NOTE — Progress Notes (Signed)
  Echocardiogram Echocardiogram Transesophageal has been performed.  Donata Clay 06/23/2015, 3:31 PM

## 2015-06-23 NOTE — Interval H&P Note (Signed)
History and Physical Interval Note:  06/23/2015 12:30 PM  [name] [name]  has presented today for surgery, with the diagnosis of stroke  The various methods of treatment have been discussed with the patient and family. After consideration of risks, benefits and other options for treatment, the patient has consented to  Procedure(s): TRANSESOPHAGEAL ECHOCARDIOGRAM (TEE) (N/A) as a surgical intervention .  The patient's history has been reviewed, patient examined, no change in status, stable for surgery.  I have reviewed the patient's chart and labs.  Questions were answered to the patient's satisfaction.     Dorris Carnes

## 2015-06-23 NOTE — Discharge Summary (Addendum)
Discharge Summary  [name] [name] [id number] DOB: Nov 28, 1940  PCP: [name] Fraction, MD  Admit date: 06/20/2015 Discharge date: 06/23/2015  Time spent: >27mins  Recommendations for Outpatient Follow-up:  1. F/u with PMD within a week  for hospital discharge follow up, repeat cbc/bmp at follow up, patient is advised to check blood pressure at home and bring record to hospital discharge appointment 2. F/u with neurology Dr [name] [name] and [facility] cardiology and [facility] as already scheduled.  Discharge Diagnoses:  Active Hospital Problems   Diagnosis Date Noted  . Syncope and collapse   . Dysarthria   . Acute CVA (cerebrovascular accident) ([place])   . Acute cystitis without hematuria   . Stroke ([place])   . Slurred speech 06/20/2015  . History of CVA (cerebrovascular accident) 06/20/2015  . AKI (acute kidney injury) ([place]) 06/20/2015  . Gastroesophageal reflux disease without esophagitis 06/05/2015  . Hyperlipidemia 06/05/2015  . COPD (chronic obstructive pulmonary disease) ([place]) 09/04/2013  . Chronic diastolic CHF (congestive heart failure) ([place]) 01/03/2013  . Depression   . Essential hypertension 04/08/2009  . Multiple sclerosis ([place]) 10/11/2006    Resolved Hospital Problems   Diagnosis Date Noted Date Resolved  No resolved problems to display.    Discharge Condition: stable  Diet recommendation: heart healthy  Filed Weights   06/20/15 0946  Weight: 99.791 kg (220 lb)    History of present illness:  Chief Complaint: slurred speech, weakness  HPI: [name] [name] is a 75 y.o. male with medical history significant for CAD, chronic diastolic CHF, COPD . Patient was hospitalized a few days ago with an acute right MCA stroke. He was discharged home with plans for outpatient PT, outpatient TEE and possibly Holter monitor. Patient did well for the two days he was home. This morning he took dog for a walk. A neighbor called the wife to report that she had found  patient sitting on the ground in the neighborhood. Patient states he tried to walk too far, became fatigued and had to sit down. When patient's wife arrived she found him with worsening slurred speech and worsening dysarthria. Patient saw PCP this a.m., he was transported by EMS from there to the ED. Since receiving a liter of fluid his neurologic symptoms have already improved though he is not back to baseline per wife.   ED Course:  Afebrile, hemodynamically stable. Normal oxygen saturation. WBC 11.1, Cr 28, 1.64 No acute changes on head CT Urinalysis: Many bacteria, positive nitrites, wbc too numerous to count EKG Sinus rhythm IVCD, consider atypical RBBB Borderline ST elevation, anterior leads  Hospital Course:  Active Problems:   Multiple sclerosis (HCC)   Essential hypertension   Depression   Chronic diastolic CHF (congestive heart failure) (HCC)   COPD (chronic obstructive pulmonary disease) (HCC)   Hyperlipidemia   Gastroesophageal reflux disease without esophagitis   Slurred speech   History of CVA (cerebrovascular accident)   AKI (acute kidney injury) ([place])   Acute cystitis without hematuria   Stroke [facility])   Dysarthria   Acute CVA (cerebrovascular accident) ([place])   Syncope and collapse   Stroke like symptoms. He is s/p recent right MCA stroke (embolic?) with residual slurred speech at baseline. with worsening slurred speech / fatigue / dysarthria possibly secondary to dehydration and / or UTI vrs progression of stroke. Slurred speech improved after liter of fluid though not back to baseline -MRI brain w and wo contrast on admission showed progression of ischemia in the superior right insula -no weakness  in extremities, no need of PT/OT, speech has improved, passed bedside swallow eval -neurology consulted, input appreciated, TEE/loop recorder on Monday per neuro recommendation -per neuro can discharge patient on plavix, continue statin, no need of  asa  Dehydation / UTI / AKI. Baseline creatinine 1.06, up to 1.64 on admission, likely secondary to volume depletion and uti -Ivf, abx, hold lasix until renal function improves -hold ditropan -  urine culture + klebsiella, sensitive to rocephin -discharged on keflex for additional 3days to finish total of 7day abx treatment for complicated uti.  Multiple sclerosis ([place]). No flares in a few years per wife. Not on treatment. -Continue Requip for restless legs  Essential hypertension,  continue beta blocker, norvasc restarted at discharge, avoid hypotension, bp goal normal tensive, pmd to continue monitor blood pressure control   Chronic diastolic CHF (congestive heart failure). Grade I diastolic dysfunction. LVEF 60-65% on echo 3 days ago . No evidence for volume overload.  -Needs IVF for dehydration in the hospital -betablocker and norvasc held in the hospital for permissive hypertension in the setting of acute stroke --continue beta blocker, norvasc restarted at discharge, avoid hypotension, bp goal normal tensive, pmd to continue monitor blood pressure control  COPD (chronic obstructive pulmonary disease) ([place]). Stable.  -continue home proventil and spriva Lung clear   Hyperlipidemia -continue home statin  Gastroesophageal reflux disease  -continue home PPI  Dehydration with acute kidney injury, dry mucous membranes. . -Received fluids bolus and maintenance fluids -Hold home Lasix ( he is on prn lasix at home) -cr normalized  COPD, stable. . -continue home Proventilair, Ovid. . Stable  -Continue home effexor, abilify  H/o bladder cancer and bladder spasm: close followed with urology at [facility]. Patient report he does self cath about one a month.   Code Status: DNR Family Communication: patient and his wife in room Disposition Plan: Discharge home after tee/loop record placement on 6/19  Consults called:  neurology/cardiology   Procedures: tee/loop record placement on 6/19  Antibiotics:  rocephin   Discharge Exam: BP 164/83 mmHg  Pulse 95  Temp(Src) 97.6 F (36.4 C) (Oral)  Resp 18  Ht 6' (1.829 m)  Wt 99.791 kg (220 lb)  BMI 29.83 kg/m2  SpO2 98%    General: NAD, speech largely clear, no repetition  Cardiovascular: RRR  Respiratory: CTABL  Abdomen: Soft/ND/NT, positive BS  Musculoskeletal: No Edema  Neuro: aaox3, slight dysarthria has almost resolved, no weakness in extremities   Discharge Instructions You were cared for by a hospitalist during your hospital stay. If you have any questions about your discharge medications or the care you received while you were in the hospital after you are discharged, you can call the unit and asked to speak with the hospitalist on call if the hospitalist that took care of you is not available. Once you are discharged, your primary care physician will handle any further medical issues. Please note that NO REFILLS for any discharge medications will be authorized once you are discharged, as it is imperative that you return to your primary care physician (or establish a relationship with a primary care physician if you do not have one) for your aftercare needs so that they can reassess your need for medications and monitor your lab values.      Discharge Instructions    Care order/instruction    Complete by:  As directed   Remove bulky dressing in Am Steristrips in place until seen  In office Keep wound dry for 2  days   Wound check in office , to be scheduled prior to release     Diet - low sodium heart healthy    Complete by:  As directed      Increase activity slowly    Complete by:  As directed             Medication List    STOP taking these medications        aspirin EC 81 MG tablet      TAKE these medications        amLODipine 5 MG tablet  Commonly known as:  NORVASC  Take 5 mg by mouth daily.      ARIPiprazole 5 MG tablet  Commonly known as:  ABILIFY  TAKE 1 TABLET DAILY     atorvastatin 20 MG tablet  Commonly known as:  LIPITOR  TAKE 1 TABLET DAILY     bisoprolol 5 MG tablet  Commonly known as:  ZEBETA  TAKE ONE-HALF (1/2) TABLET BY MOUTH ONCE  DAILY     cephALEXin 500 MG capsule  Commonly known as:  KEFLEX  Take 1 capsule (500 mg total) by mouth 2 (two) times daily.     clopidogrel 75 MG tablet  Commonly known as:  PLAVIX  Take 1 tablet (75 mg total) by mouth daily.     furosemide 40 MG tablet  Commonly known as:  LASIX  Take 1 tablet (40 mg total) by mouth daily as needed (swelling). Please keep upcoming appointment for further refills     HYDROcodone-acetaminophen 10-325 MG tablet  Commonly known as:  NORCO  Take 1 tablet by mouth every 6 (six) hours as needed for moderate pain or severe pain.     omeprazole 20 MG capsule  Commonly known as:  PRILOSEC  Take 1 capsule (20 mg total) by mouth daily.     oxybutynin 5 MG 24 hr tablet  Commonly known as:  DITROPAN-XL  Take 5 mg by mouth at bedtime.     predniSONE 10 MG tablet  Commonly known as:  DELTASONE  Take 10 mg by mouth daily as needed (FOR PAIN).     PROAIR HFA 108 (90 Base) MCG/ACT inhaler  Generic drug:  albuterol  USE 1 TO 2 INHALATIONS EVERY 6 HOURS AS NEEDED FOR WHEEZING OR SHORTNESS OF BREATH     rOPINIRole 1 MG tablet  Commonly known as:  REQUIP  Take 1 mg by mouth at bedtime.     SPIRIVA HANDIHALER 18 MCG inhalation capsule  Generic drug:  tiotropium  INHALE THE CONTENTS OF 1 CAPSULE WITH 2         INHALATIONS ONCE DAILY AS DIRECTED     venlafaxine XR 150 MG 24 hr capsule  Commonly known as:  EFFEXOR-XR  Take 150 mg by mouth 2 (two) times daily.     vitamin B-12 1000 MCG tablet  Commonly known as:  CYANOCOBALAMIN  Take 1,000 mcg by mouth daily.       Allergies  Allergen Reactions  . Acetylcholine     unknown  . Alcohol-Sulfur [Sulfur]     unknown  . Amitriptyline     unknown   . Bupivacaine     unknown  . Clomipramine Hcl     unknown  . Cocaine     unknown  . Desipramine     unknown  . Ergonovine     unknown  . Flecainide     unknown  . Lithium     unknown  .  Loxapine     unknown  . Nortriptyline     unknown  . Oxcarbazepine     unknown  . Procainamide     unknown  . Procaine     unknwon  . Propafenone     unknown  . Propofol     unknown  . Trifluoperazine     unknown   Follow-up Information    Follow up with [facility] On 07/07/2015.   Specialty:  Cardiology   Why:  at 11:30AM for wound check   Contact information:   [address] [place] [phone]      Follow up with Richardson Dopp, PA-C On 09/22/2015.   Specialties:  Cardiology, Physician Assistant   Why:  at 8:45AM    Contact information:   [address] [place] [address] [phone]       Follow up with [name] Fraction, MD On 06/26/2015.   Specialty:  Family Medicine   Why:  hospital discharge follow up, repeat bmp at follow up, monitor blood pressure.   Contact information:   [address] [place] [address] [phone]       Follow up with Shannen Flansburg,Jindong, MD. Go on 08/14/2015.   Specialty:  Neurology   Why:  stroke clinic   Contact information:   [address]  [address] [phone]        The results of significant diagnostics from this hospitalization (including imaging, microbiology, ancillary and laboratory) are listed below for reference.    Significant Diagnostic Studies: Ct Angio Head W/cm &/or Wo Cm  06/17/2015  CLINICAL DATA:  Follow-up stroke. History of multiple sclerosis, hypertension, hyperlipidemia, cancer. EXAM: CT ANGIOGRAPHY HEAD TECHNIQUE: Multidetector CT imaging of the head was performed using the standard protocol during bolus administration of intravenous contrast. Multiplanar CT image reconstructions and MIPs were  obtained to evaluate the vascular anatomy. CONTRAST:  50 cc Isovue 370 COMPARISON:  None. FINDINGS: CT HEAD INTRACRANIAL CONTENTS: The ventricles and sulci are normal for age. No intraparenchymal hemorrhage, mass effect nor midline shift. Patchy supratentorial white matter hypodensities. Focal loss of RIGHT frontal gray-white matter differentiation corresponding to known acute stroke. No abnormal extra-axial fluid collections. Basal cisterns are patent. Mild calcific atherosclerosis of the carotid siphons. No abnormal intracranial enhancement. ORBITS: The included ocular globes and orbital contents are non-suspicious. SINUSES: The mastoid aircells and included paranasal sinuses are well-aerated. SKULL/SOFT TISSUES: No skull fracture. No significant soft tissue swelling. Severe temporomandibular osteoarthrosis. CTA HEAD ANTERIOR CIRCULATION: Normal appearance of the cervical internal carotid arteries, petrous, cavernous and supra clinoid internal carotid arteries. Widely patent anterior communicating artery. Normal appearance of the anterior and middle cerebral arteries. POSTERIOR CIRCULATION: Codominant vertebral arteries with normal appearance of the vertebral arteries, vertebrobasilar junction and basilar artery, as well as main branch vessels. Normal appearance of the posterior cerebral arteries. No large vessel occlusion, hemodynamically significant stenosis, dissection, luminal irregularity, contrast extravasation or aneurysm within the anterior nor posterior circulation. IMPRESSION: CT HEAD: Evolving small RIGHT frontal/ MCA territory infarct. Moderate to severe white matter changes better characterized on recent MRI of the brain. CTA HEAD: Negative. Electronically Signed   By: Elon Alas M.D.   On: 06/17/2015 01:59   Dg Chest 2 View  06/20/2015  CLINICAL DATA:  Altered mental status EXAM: CHEST  2 VIEW COMPARISON:  06/05/2015 FINDINGS: Cardiac shadow is within normal limits. The lungs demonstrate  minimal platelike atelectasis on  the left. The previously seen nodules in the right lung base are not well appreciated on this exam. No effusion is noted. No acute bony abnormality is seen. IMPRESSION: Stable changes from recent CT.  No acute abnormality noted. Electronically Signed   By: Inez Catalina M.D.   On: 06/20/2015 11:03   Ct Head Wo Contrast  06/20/2015  CLINICAL DATA:  Slurred speech and left-sided facial droop EXAM: CT HEAD WITHOUT CONTRAST TECHNIQUE: Contiguous axial images were obtained from the base of the skull through the vertex without intravenous contrast. COMPARISON:  06/16/2015 FINDINGS: Bony calvarium is intact. Mild atrophic changes are noted. Areas of decreased attenuation are noted consistent with chronic white matter ischemic change. The known right middle cerebral artery infarct changes are again noted and stable. No findings to suggest acute hemorrhage, acute infarction or space-occupying mass lesion is seen. Note is made of branching air within the right parotid gland this is likely related to intravenous air from IV placement. IMPRESSION: Stable atrophic and ischemic changes. No acute abnormality is noted. Electronically Signed   By: Inez Catalina M.D.   On: 06/20/2015 10:53   Ct Head Wo Contrast  06/15/2015  CLINICAL DATA:  Slurred speech and confusion since Friday. History of dementia and multiple sclerosis. EXAM: CT HEAD WITHOUT CONTRAST TECHNIQUE: Contiguous axial images were obtained from the base of the skull through the vertex without intravenous contrast. COMPARISON:  08/12/2009 MRI FINDINGS: Sinuses/Soft tissues: Clear paranasal sinuses and mastoid air cells. Intracranial: Moderate periventricular white matter hypoattenuation is grossly similar to the prior MRI, given cross modality comparison. Mild cerebral and cerebellar atrophy. No mass lesion, hemorrhage, hydrocephalus, acute infarct, intra-axial, or extra-axial fluid collection. IMPRESSION: 1.  No acute intracranial  abnormality. 2. Periventricular white matter hypoattenuation may relate to the clinical history of multiple sclerosis and/or chronic small vessel ischemic change. 3. Cerebral and cerebellar atrophy. Electronically Signed   By: Abigail Miyamoto M.D.   On: 06/15/2015 11:03   Ct Chest Wo Contrast  06/05/2015  CLINICAL DATA:  Lung nodule followup EXAM: CT CHEST WITHOUT CONTRAST TECHNIQUE: Multidetector CT imaging of the chest was performed following the standard protocol without IV contrast. COMPARISON:  08/07/2014 FINDINGS: Mediastinum/Nodes: Coronary, aortic arch, and branch vessel atherosclerotic vascular disease. 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 Thick atheromatous calcification along the posterior margin of the descending thoracic aorta near the hiatus, extending into the lumen, as on prior exam, compatible with chronic calcified mural thrombus. No pathologic thoracic adenopathy. Lungs/Pleura: 0.6 by 0.5 cm right middle lobe pulmonary nodule, no change 06/26/2013. There are 3 nodular spiculated densities in the right middle lobe inferomedially. One of these measures 8 mm in thickness on image 109 series 3, previously the same on 06/06/2013. Another measures 2.4 by 1.1 cm on image 108 series 3, previously 2.0 by 1.6 cm in 2015. The most anterior, which extends adjacent to the diaphragm, measures 2.9 by 1.8 cm, formerly 2.8 by 1.6 cm by my measurements on 06/26/2013. Accordingly these 3 spiculated nodules are essentially stable as well. These nodules have no hypermetabolic activity on PET-CT of 12/11/2013. There is some scarring in the lingula which appears stable. No new nodule. Upper abdomen: Unremarkable Musculoskeletal: Thoracic spondylosis. IMPRESSION: 1. The nodules are stable from 06/26/2013. More spiculated nodules inferomedially in the right middle lobe were not previously positive on PET scan and accordingly are likely benign. The 5 by 6 mm nodule is also stable over the past 2 years. This 2  year stability is compatible  with benign process, and based on current guidelines, further follow up surveillance is not required. 2. 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 Recommend annual imaging followup by CTA or MRA. This recommendation follows 2010 ACCF/AHA/AATS/ACR/ASA/SCA/SCAI/SIR/STS/SVM Guidelines for the Diagnosis and Management of Patients with Thoracic Aortic Disease. Circulation. 2010; 121: [id number] 3. Coronary, aortic arch, and branch vessel atherosclerotic vascular disease. Electronically Signed   By: Van Clines M.D.   On: 06/05/2015 17:00   Mr Jeri Cos [id number] Contrast  06/20/2015  CLINICAL DATA:  Recent stroke.  Increasing confusion. EXAM: MRI HEAD WITHOUT AND WITH CONTRAST TECHNIQUE: Multiplanar, multiecho pulse sequences of the brain and surrounding structures were obtained without and with intravenous contrast. CONTRAST:  90mL MULTIHANCE GADOBENATE DIMEGLUMINE 529 MG/ML IV SOLN COMPARISON:  MRI brain 06/15/2015.  CT head earlier today. FINDINGS: There is a subcentimeter focus of restricted diffusion in the superior RIGHT insula, see image 28 series 4 and image 21 series 7, not present previously although some insular ischemia was present on 06/15/15. This represents slight progression of acute RIGHT MCA infarction. Otherwise, no other significant changes. No hemorrhagic transformation, significant mass effect, or proximal vascular occlusion. Baseline atrophy and small vessel disease is stable. Post infusion, mild intravascular and parenchymal enhancement within the areas of ischemia characterizing the subacute time course are evident. IMPRESSION: Subcentimeter focus of restricted diffusion in the superior RIGHT insula not present previously, representing progression of ischemia. Otherwise stable multifocal areas of acute RIGHT MCA infarction. No interval development of large vessel occlusion, or significant mass effect. Mild postcontrast enhancement, consistent with the subacute  time course. Electronically Signed   By: Staci Righter M.D.   On: 06/20/2015 14:53   Mr Jeri Cos [id number] Contrast  06/15/2015  CLINICAL DATA:  Acute mental status changes. Headache. Numbness and tingling. History of multiple sclerosis. EXAM: MRI HEAD WITHOUT AND WITH CONTRAST TECHNIQUE: Multiplanar, multiecho pulse sequences of the brain and surrounding structures were obtained without and with intravenous contrast. CONTRAST:  68mL MULTIHANCE GADOBENATE DIMEGLUMINE 529 MG/ML IV SOLN COMPARISON:  CT same day.  MRI 08/12/2009. FINDINGS: Diffusion imaging shows scattered foci of restricted diffusion in the insular and posterior frontal region consistent with right MCA branch vessel territory infarction. No other vascular territory acute infarction. No swelling or hemorrhage. The brainstem is normal. There are old small vessel cerebellar infarctions. Cerebral hemispheres show extensive chronic small vessel ischemic changes throughout the deep white matter. Given the history of multiple sclerosis, some of these white matter lesions could relate to chronic demyelination. No mass lesion, hemorrhage, hydrocephalus or extra-axial collection. No pituitary mass. No inflammatory sinus disease. No skull or skullbase lesion. Major vessels at the base of the brain show flow. After contrast administration, no abnormal enhancement occurs. IMPRESSION: Areas of acute infarction in the region of the insula and posterior frontal lobe on the right consistent with right MCA branch vessel occlusion. No swelling or hemorrhage. Extensive chronic small vessel ischemic changes elsewhere throughout the brain, progressive since the previous exam. Given the history of multiple sclerosis clinically, some of these white matter lesions could relate to chronic demyelination. Electronically Signed   By: Nelson Chimes M.D.   On: 06/15/2015 13:36    Microbiology: Recent Results (from the past 240 hour(s))  Urine culture     Status: Abnormal    Collection Time: 06/20/15 11:40 AM  Result Value Ref Range Status   Specimen Description URINE, CATHETERIZED  Final   Special Requests NONE  Final   Culture >=100,000 COLONIES/mL KLEBSIELLA  OXYTOCA (A)  Final   Report Status 06/22/2015 FINAL  Final   Organism ID, Bacteria KLEBSIELLA OXYTOCA (A)  Final      Susceptibility   Klebsiella oxytoca - MIC*    AMPICILLIN >=32 RESISTANT Resistant     CEFAZOLIN 8 SENSITIVE Sensitive     CEFTRIAXONE <=1 SENSITIVE Sensitive     CIPROFLOXACIN <=0.25 SENSITIVE Sensitive     GENTAMICIN <=1 SENSITIVE Sensitive     IMIPENEM <=0.25 SENSITIVE Sensitive     NITROFURANTOIN <=16 SENSITIVE Sensitive     TRIMETH/SULFA <=20 SENSITIVE Sensitive     AMPICILLIN/SULBACTAM 8 SENSITIVE Sensitive     PIP/TAZO <=4 SENSITIVE Sensitive     * >=100,000 COLONIES/mL KLEBSIELLA OXYTOCA     Labs: Basic Metabolic Panel:  Recent Labs Lab 06/17/15 0347 06/20/15 0855 06/20/15 0940 06/20/15 1033 06/21/15 0540 06/22/15 0438  NA 135  --  138 140 141 141  K 3.9  --  4.8 4.6 4.2 4.2  CL 101  --  105 103 109 108  CO2 25  --  25  --  26 26  GLUCOSE 103* 75 75 68 96 91  BUN 19  --  28* 34* 17 11  CREATININE 1.06  --  1.64* 1.60* 1.00 0.90  CALCIUM 9.3  --  9.1  --  8.7* 8.8*   Liver Function Tests:  Recent Labs Lab 06/20/15 0940  AST 24  ALT 21  ALKPHOS 72  BILITOT 0.3  PROT 6.7  ALBUMIN 3.9   No results for input(s): LIPASE, AMYLASE in the last 168 hours. No results for input(s): AMMONIA in the last 168 hours. CBC:  Recent Labs Lab 06/20/15 0940 06/20/15 1033 06/21/15 0540 06/22/15 0438  WBC 11.1*  --  7.8 7.4  NEUTROABS 7.0  --   --   --   HGB 12.7* 14.3 12.0* 12.6*  HCT 41.5 42.0 39.3 40.7  MCV 95.6  --  95.6 94.4  PLT 319  --  280 285   Cardiac Enzymes: No results for input(s): CKTOTAL, CKMB, CKMBINDEX, TROPONINI in the last 168 hours. BNP: BNP (last 3 results) No results for input(s): BNP in the last 8760 hours.  ProBNP (last 3  results) No results for input(s): PROBNP in the last 8760 hours.  CBG:  Recent Labs Lab 06/20/15 1023  GLUCAP 80       Signed:  Lucero Auzenne MD, PhD  [facility] 06/23/2015, 6:58 PM

## 2015-06-24 ENCOUNTER — Encounter (HOSPITAL_COMMUNITY): Payer: Self-pay | Admitting: Internal Medicine

## 2015-06-24 ENCOUNTER — Telehealth: Payer: Self-pay | Admitting: Family Medicine

## 2015-06-24 ENCOUNTER — Encounter: Payer: Self-pay | Admitting: Internal Medicine

## 2015-06-24 MED ORDER — CLOPIDOGREL BISULFATE 75 MG PO TABS
75.0000 mg | ORAL_TABLET | Freq: Every day | ORAL | Status: DC
Start: 2015-06-24 — End: 2015-07-18

## 2015-06-24 MED ORDER — CEPHALEXIN 500 MG PO CAPS: 500.0000 mg | ORAL_CAPSULE | Freq: Two times a day (BID) | ORAL | Status: DC

## 2015-06-24 NOTE — Telephone Encounter (Signed)
Medication called/sent to requested pharmacy  

## 2015-06-24 NOTE — Telephone Encounter (Signed)
Pt needs Clopidogrel 75 mg and Cephalexin 500 mg sent to the [facility] on [address]. Pt's wife is asking these to be called in tonight as he is currently out.

## 2015-06-26 ENCOUNTER — Ambulatory Visit (INDEPENDENT_AMBULATORY_CARE_PROVIDER_SITE_OTHER): Payer: Medicare Other | Admitting: Family Medicine

## 2015-06-26 ENCOUNTER — Encounter: Payer: Self-pay | Admitting: Family Medicine

## 2015-06-26 VITALS — BP 144/82 | HR 78 | Temp 98.5°F | Resp 18 | Ht 71.0 in | Wt 215.0 lb

## 2015-06-26 DIAGNOSIS — I10 Essential (primary) hypertension: Secondary | ICD-10-CM

## 2015-06-26 DIAGNOSIS — Z09 Encounter for follow-up examination after completed treatment for conditions other than malignant neoplasm: Secondary | ICD-10-CM | POA: Diagnosis not present

## 2015-06-26 DIAGNOSIS — E785 Hyperlipidemia, unspecified: Secondary | ICD-10-CM | POA: Diagnosis not present

## 2015-06-26 DIAGNOSIS — N3000 Acute cystitis without hematuria: Secondary | ICD-10-CM | POA: Diagnosis not present

## 2015-06-26 DIAGNOSIS — I639 Cerebral infarction, unspecified: Secondary | ICD-10-CM

## 2015-06-26 DIAGNOSIS — R7303 Prediabetes: Secondary | ICD-10-CM

## 2015-06-26 DIAGNOSIS — R7309 Other abnormal glucose: Secondary | ICD-10-CM | POA: Diagnosis not present

## 2015-06-26 LAB — CBC WITH DIFFERENTIAL/PLATELET
Basophils Absolute: 196 cells/uL (ref 0–200)
Basophils Relative: 2 %
EOS ABS: 882 {cells}/uL — AB (ref 15–500)
EOS PCT: 9 %
HCT: 44.9 % (ref 38.5–50.0)
HEMOGLOBIN: 14.8 g/dL (ref 13.0–17.0)
Lymphocytes Relative: 22 %
Lymphs Abs: 2156 cells/uL (ref 850–3900)
MCH: 30.3 pg (ref 27.0–33.0)
MCHC: 33 g/dL (ref 32.0–36.0)
MCV: 92 fL (ref 80.0–100.0)
MONO ABS: 882 {cells}/uL (ref 200–950)
MONOS PCT: 9 %
MPV: 10.2 fL (ref 7.5–12.5)
NEUTROS PCT: 58 %
Neutro Abs: 5684 cells/uL (ref 1500–7800)
PLATELETS: 384 10*3/uL (ref 140–400)
RBC: 4.88 MIL/uL (ref 4.20–5.80)
RDW: 14.7 % (ref 11.0–15.0)
WBC: 9.8 10*3/uL (ref 3.8–10.8)

## 2015-06-26 LAB — COMPLETE METABOLIC PANEL WITH GFR
ALBUMIN: 4.4 g/dL (ref 3.6–5.1)
ALK PHOS: 81 U/L (ref 40–115)
ALT: 29 U/L (ref 9–46)
AST: 28 U/L (ref 10–35)
BILIRUBIN TOTAL: 0.5 mg/dL (ref 0.2–1.2)
BUN: 22 mg/dL (ref 7–25)
CO2: 26 mmol/L (ref 20–31)
Calcium: 9.5 mg/dL (ref 8.6–10.3)
Chloride: 99 mmol/L (ref 98–110)
Creat: 1.32 mg/dL — ABNORMAL HIGH (ref 0.70–1.18)
GFR, EST AFRICAN AMERICAN: 61 mL/min (ref >=60)
GFR, EST NON AFRICAN AMERICAN: 53 mL/min — AB (ref >=60)
GLUCOSE: 101 mg/dL — AB (ref 70–99)
POTASSIUM: 4.8 mmol/L (ref 3.5–5.3)
SODIUM: 139 mmol/L (ref 135–146)
Total Protein: 7.4 g/dL (ref 6.1–8.1)

## 2015-06-26 LAB — HEMOGLOBIN A1C
Hgb A1c MFr Bld: 6.3 % — ABNORMAL HIGH (ref <5.7)
MEAN PLASMA GLUCOSE: 134 mg/dL

## 2015-06-26 NOTE — Progress Notes (Signed)
Subjective:    Patient ID: [name] [name], male    DOB: 01/05/1940, 75 y.o.   MRN: [id number]  HPI Recently admitted to hospital.  I have copied relevant portions of the DC summary below for reference: Admit date: 06/20/2015 Discharge date: 06/23/2015  Time spent: >46mins  Recommendations for Outpatient Follow-up:  1. F/u with PMD within a week for hospital discharge follow up, repeat cbc/bmp at follow up, patient is advised to check blood pressure at home and bring record to hospital discharge appointment 2. F/u with neurology Dr Lottie Rater and [facility] cardiology and [place] [facility] as already scheduled.  Discharge Diagnoses:  Active Hospital Problems   Diagnosis Date Noted  . Syncope and collapse   . Dysarthria   . Acute CVA (cerebrovascular accident) ([place])   . Acute cystitis without hematuria   . Stroke ([place])   . Slurred speech 06/20/2015  . History of CVA (cerebrovascular accident) 06/20/2015  . AKI (acute kidney injury) ([place]) 06/20/2015  . Gastroesophageal reflux disease without esophagitis 06/05/2015  . Hyperlipidemia 06/05/2015  . COPD (chronic obstructive pulmonary disease) ([place]) 09/04/2013  . Chronic diastolic CHF (congestive heart failure) ([place]) 01/03/2013  . Depression   . Essential hypertension 04/08/2009  . Multiple sclerosis ([place]) 10/11/2006    Resolved Hospital Problems   Diagnosis Date Noted Date Resolved  No resolved problems to display.    Discharge Condition: stable  Diet recommendation: heart healthy  Filed Weights   06/20/15 0946  Weight: 99.791 kg (220 lb)    History of present illness:  Chief Complaint: slurred speech, weakness  HPI: [name] [name] is a 75 y.o. male with medical history significant for CAD, chronic diastolic CHF, COPD . Patient was hospitalized a few days ago with an acute right MCA stroke. He was discharged home with plans for outpatient PT, outpatient TEE  and possibly Holter monitor. Patient did well for the two days he was home. This morning he took dog for a walk. A neighbor called the wife to report that she had found patient sitting on the ground in the neighborhood. Patient states he tried to walk too far, became fatigued and had to sit down. When patient's wife arrived she found him with worsening slurred speech and worsening dysarthria. Patient saw PCP this a.m., he was transported by EMS from there to the ED. Since receiving a liter of fluid his neurologic symptoms have already improved though he is not back to baseline per wife.   ED Course:  Afebrile, hemodynamically stable. Normal oxygen saturation. WBC 11.1, Cr 28, 1.64 No acute changes on head CT Urinalysis: Many bacteria, positive nitrites, wbc too numerous to count EKG Sinus rhythm IVCD, consider atypical RBBB Borderline ST elevation, anterior leads  Hospital Course:  Active Problems:  Multiple sclerosis (HCC)  Essential hypertension  Depression  Chronic diastolic CHF (congestive heart failure) (HCC)  COPD (chronic obstructive pulmonary disease) (HCC)  Hyperlipidemia  Gastroesophageal reflux disease without esophagitis  Slurred speech  History of CVA (cerebrovascular accident)  AKI (acute kidney injury) ([place])  Acute cystitis without hematuria  Stroke [facility])  Dysarthria  Acute CVA (cerebrovascular accident) ([place])  Syncope and collapse   Stroke like symptoms. He is s/p recent right MCA stroke (embolic?) with residual slurred speech at baseline. with worsening slurred speech / fatigue / dysarthria possibly secondary to dehydration and / or UTI vrs progression of stroke. Slurred speech improved after liter of fluid though not back to baseline -MRI brain w and wo contrast on admission showed  progression of ischemia in the superior right insula -no weakness in extremities, no need of PT/OT, speech has improved, passed bedside swallow eval -neurology  consulted, input appreciated, TEE/loop recorder on Monday per neuro recommendation -per neuro can discharge patient on plavix, continue statin, no need of asa  Dehydation / UTI / AKI. Baseline creatinine 1.06, up to 1.64 on admission, likely secondary to volume depletion and uti -Ivf, abx, hold lasix until renal function improves -hold ditropan - urine culture + klebsiella, sensitive to rocephin -discharged on keflex for additional 3days to finish total of 7day abx treatment for complicated uti.  Multiple sclerosis ([place]). No flares in a few years per wife. Not on treatment. -Continue Requip for restless legs  Essential hypertension,  continue beta blocker, norvasc restarted at discharge, avoid hypotension, bp goal normal tensive, pmd to continue monitor blood pressure control   Chronic diastolic CHF (congestive heart failure). Grade I diastolic dysfunction. LVEF 60-65% on echo 3 days ago . No evidence for volume overload.  -Needs IVF for dehydration in the hospital -betablocker and norvasc held in the hospital for permissive hypertension in the setting of acute stroke --continue beta blocker, norvasc restarted at discharge, avoid hypotension, bp goal normal tensive, pmd to continue monitor blood pressure control  COPD (chronic obstructive pulmonary disease) ([place]). Stable.  -continue home proventil and spriva Lung clear   Hyperlipidemia -continue home statin  Gastroesophageal reflux disease  -continue home PPI  Dehydration with acute kidney injury, dry mucous membranes. . -Received fluids bolus and maintenance fluids -Hold home Lasix ( he is on prn lasix at home) -cr normalized  COPD, stable. . -continue home Proventilair, [place]. . Stable  -Continue home effexor, abilify  H/o bladder cancer and bladder spasm: close followed with urology at [facility]. Patient report he does self cath about one a month.   Code Status: DNR Family  Communication: patient and his wife in room Disposition Plan: Discharge home after tee/loop record placement on 6/19  Consults called: neurology/cardiology   Procedures: tee/loop record placement on 6/19     He is here for follow up.  He has been taking aspirin 325 mg a day and Plavix since discharge. I explained to both the patient and the wife that he only needs to be on Plavix. Otherwise he has been doing well since hospital discharge. Based on his baseline, his thought processes do seem to be somewhat delayed. He does seem to have some mild memory impairment since the stroke. For instance yesterday he was pumping gas in the car and forgot to remove the handle from the gas tank. When he pulled away from the pump, he ripped the handle off. His wife is driving. He is no longer driving. However this is not the patient's baseline. He still has 3 days on his antibiotics. He denies any fevers or chills. His blood pressure at home has been between 130 and 140. Diastolic blood pressures have been excellent. I am satisfied with these blood pressures I want to avoid hypotension given his recent CVA. Otherwise he is doing well without concerns Past Medical History  Diagnosis Date  . MS (multiple sclerosis) ([place])   . Arthritis     s/p TKR  . Right bundle branch block   . Brugada syndrome     Possible Type II Brugada ECG pattern. No family history of SCD, no syncope, no tachypalpitations.  Marland Kitchen COPD (chronic obstructive pulmonary disease) ([place])     Prior heavy smoker. PFTs (3/11): FVC 87%, FEV1  73%, ratio 0.57, DLCO 75%, TLC 121%. Moderate obstructive defect. PFTs (9/15): Only minimal obstruction, sugPrior heavy smoker. PFTs (3/11): FVC 87%, FEV1 73%, ratio 0.57, DLCO 75%, TLC 121%. Moderate obstructive defect. PFTs (9/15) minimal obstruction, poss asthma component   . GERD (gastroesophageal reflux disease)   . Depression     with bipolar tendencies  . Urinary retention with incomplete bladder  emptying     receives botox injections and treatment for BPH through [facility]  . BPH (benign prostatic hyperplasia)   . CAD (coronary artery disease)     a. LHC 1/15 - mid LCx 50 and 60%, proximal RCA 50%  . HLD (hyperlipidemia)   . HTN (hypertension)   . Chronic diastolic CHF (congestive heart failure) ([place])   . LVH (left ventricular hypertrophy)     a. Echo 12/14 Inferior and distal septal HK, mild LVH, EF 50-55%, mild MR, mild LAE  . Bladder cancer (HCC)     [facility], Ta low grade papillary urothileal carcinoma  . DOE (dyspnea on exertion)     a. Myoview 8/09- EF 57%, no ischemia // b. Myoview (3/11) EF 68%, diaphragmatic attenuation, no ischemia //  c Echo (4/11) EF 0000000, mild diastolic dysfunction, PASP 38 mmHg // d. PFTs 9/15 minimal obstruction  /  e. CT negative for ILD  . Mitral regurgitation     Echo (4/11) with PISA ERO 0.3 cm^2 and regurgitant volume 41 mL (moderate MR). Echo (12/14) with mild MR.   Marland Kitchen History of Doppler ultrasound     Carotid US 3/11 negative for significant stenosis  . Mild dementia   . Low testosterone   . CKD (chronic kidney disease)   . Lung nodule     a. CT in 2015 >> PET in 12/15 not sugg of malignancy   . Thoracic aortic aneurysm (HCC)     4.1 cm 2017  . Stroke [facility])    Past Surgical History  Procedure Laterality Date  . Total knee arthroplasty Right   . Transurethral resection of prostate    . Tonsillectomy    . Appendectomy    . Rotator cuff repair    . Hemorrhoid surgery    . Left heart catheterization with coronary angiogram N/A 01/05/2013    Procedure: LEFT HEART CATHETERIZATION WITH CORONARY ANGIOGRAM;  Surgeon: Larey Dresser, MD;  Location: [facility] CATH LAB;  Service: Cardiovascular;  Laterality: N/A;  . Ep implantable device N/A 06/23/2015    Procedure: Loop Recorder Insertion;  Surgeon: Deboraha Sprang, MD;  Location: [facility] CV LAB;  Service: Cardiovascular;  Laterality: N/A;  . Tee without cardioversion N/A 06/23/2015    Procedure:  TRANSESOPHAGEAL ECHOCARDIOGRAM (TEE);  Surgeon: Fay Records, MD;  Location: [facility] ENDOSCOPY;  Service: Cardiovascular;  Laterality: N/A;   Current Outpatient Prescriptions on File Prior to Visit  Medication Sig Dispense Refill  . amLODipine (NORVASC) 5 MG tablet Take 5 mg by mouth daily.    . ARIPiprazole (ABILIFY) 5 MG tablet TAKE 1 TABLET DAILY 90 tablet 2  . atorvastatin (LIPITOR) 20 MG tablet TAKE 1 TABLET DAILY 90 tablet 3  . bisoprolol (ZEBETA) 5 MG tablet TAKE ONE-HALF (1/2) TABLET BY MOUTH ONCE  DAILY    . cephALEXin (KEFLEX) 500 MG capsule Take 1 capsule (500 mg total) by mouth 2 (two) times daily. 6 capsule 0  . clopidogrel (PLAVIX) 75 MG tablet Take 1 tablet (75 mg total) by mouth daily. 30 tablet 5  . furosemide (LASIX) 40 MG tablet Take 1 tablet (  40 mg total) by mouth daily as needed (swelling). Please keep upcoming appointment for further refills 180 tablet 0  . HYDROcodone-acetaminophen (NORCO) 10-325 MG tablet Take 1 tablet by mouth every 6 (six) hours as needed for moderate pain or severe pain. 30 tablet 0  . omeprazole (PRILOSEC) 20 MG capsule Take 1 capsule (20 mg total) by mouth daily. 90 capsule 3  . oxybutynin (DITROPAN-XL) 5 MG 24 hr tablet Take 5 mg by mouth at bedtime.    . predniSONE (DELTASONE) 10 MG tablet Take 10 mg by mouth daily as needed (FOR PAIN).    Marland Kitchen PROAIR HFA 108 (90 BASE) MCG/ACT inhaler USE 1 TO 2 INHALATIONS EVERY 6 HOURS AS NEEDED FOR WHEEZING OR SHORTNESS OF BREATH 25.5 g 4  . rOPINIRole (REQUIP) 1 MG tablet Take 1 mg by mouth at bedtime.     Marland Kitchen SPIRIVA HANDIHALER 18 MCG inhalation capsule INHALE THE CONTENTS OF 1 CAPSULE WITH 2         INHALATIONS ONCE DAILY AS DIRECTED 1 capsule 11  . venlafaxine XR (EFFEXOR-XR) 150 MG 24 hr capsule Take 150 mg by mouth 2 (two) times daily.     . vitamin B-12 (CYANOCOBALAMIN) 1000 MCG tablet Take 1,000 mcg by mouth daily.     No current facility-administered medications on file prior to visit.   Allergies  Allergen  Reactions  . Acetylcholine     unknown  . Alcohol-Sulfur [Sulfur]     unknown  . Amitriptyline     unknown  . Bupivacaine     unknown  . Clomipramine Hcl     unknown  . Cocaine     unknown  . Desipramine     unknown  . Ergonovine     unknown  . Flecainide     unknown  . Lithium     unknown  . Loxapine     unknown  . Nortriptyline     unknown  . Oxcarbazepine     unknown  . Procainamide     unknown  . Procaine     unknwon  . Propafenone     unknown  . Propofol     unknown  . Trifluoperazine     unknown   Social History   Social History  . Marital Status: Married    Spouse Name: N/A  . Number of Children: N/A  . Years of Education: N/A   Occupational History  . retired    Social History Main Topics  . Smoking status: Former Smoker -- 2.00 packs/day for 40 years    Types: Cigarettes    Quit date: 01/04/1993  . Smokeless tobacco: Former Systems developer    Types: Chew  . Alcohol Use: No     Comment: 6 pack/day - 06/20/15 Pt states wuit drinking in the 1970's  . Drug Use: No  . Sexual Activity: Not on file   Other Topics Concern  . Not on file   Social History Narrative   Retired from the [employer] after 20 years   [place] Veteran      Review of Systems  All other systems reviewed and are negative.      Objective:   Physical Exam  Constitutional: He is oriented to person, place, and time.  Cardiovascular: Normal rate, regular rhythm and normal heart sounds.   Pulmonary/Chest: Effort normal and breath sounds normal. No respiratory distress. He has no wheezes. He has no rales.  Abdominal: Soft. Bowel sounds are normal.  Neurological: He is alert  and oriented to person, place, and time. He has normal reflexes. He displays normal reflexes. No cranial nerve deficit. He exhibits normal muscle tone. Coordination normal.  Psychiatric: He has a normal mood and affect. His speech is normal. He is slowed.  Vitals reviewed.         Assessment & Plan:    Hospital discharge follow-up  Right sided cerebral hemisphere cerebrovascular accident (CVA) ([place])  Acute cystitis without hematuria  Prediabetes - Plan: CBC with Differential/Platelet, COMPLETE METABOLIC PANEL WITH GFR, Hemoglobin A1c  HLD (hyperlipidemia)  Benign essential HTN  Overall, the patient looks much better than he did prior to his recent hospitalization. I want him to monitor his blood pressure 2-3 times a day and recheck with me next week. Ideally I would like his blood pressure to be less than [id number] systolic. I would like him to return next week for urine sample off antibiotics to ensure resolution of his urinary tract infection. I emphasized to the patient he needs to stop aspirin and only take Plavix. We will recheck a fasting lipid panel and hemoglobin A1c in 3 months. He will follow-up with his neurologist in August.

## 2015-06-27 ENCOUNTER — Telehealth: Payer: Self-pay | Admitting: Family Medicine

## 2015-06-27 NOTE — Telephone Encounter (Signed)
Patient calling to let you know that his blood pressure range is  160 high low [phone]

## 2015-06-27 NOTE — Telephone Encounter (Signed)
That is too high.  I would increase bisoprolol to 1 whole tablet a day (records show he is on 1/2 tab).

## 2015-06-27 NOTE — Telephone Encounter (Signed)
lmtrc

## 2015-06-30 ENCOUNTER — Ambulatory Visit (INDEPENDENT_AMBULATORY_CARE_PROVIDER_SITE_OTHER): Payer: Medicare Other | Admitting: Physician Assistant

## 2015-06-30 ENCOUNTER — Inpatient Hospital Stay: Admission: RE | Admit: 2015-06-30 | Payer: Medicare Other | Source: Ambulatory Visit

## 2015-06-30 ENCOUNTER — Encounter: Payer: Self-pay | Admitting: Physician Assistant

## 2015-06-30 VITALS — BP 140/72 | HR 82 | Temp 98.3°F | Resp 16 | Ht 71.0 in | Wt 218.0 lb

## 2015-06-30 DIAGNOSIS — N390 Urinary tract infection, site not specified: Secondary | ICD-10-CM | POA: Diagnosis not present

## 2015-06-30 DIAGNOSIS — I10 Essential (primary) hypertension: Secondary | ICD-10-CM

## 2015-06-30 DIAGNOSIS — I639 Cerebral infarction, unspecified: Secondary | ICD-10-CM | POA: Diagnosis not present

## 2015-06-30 MED ORDER — AMLODIPINE BESYLATE 10 MG PO TABS: 10.0000 mg | ORAL_TABLET | Freq: Every day | ORAL | Status: DC

## 2015-06-30 NOTE — Progress Notes (Signed)
Patient ID: WELSEY DIA MRN: [id number], DOB: 1940/09/07, 75 y.o. Date of Encounter: @DATE @  Chief Complaint:  Chief Complaint  Patient presents with  . FOllow up    HTN    HPI: 75 y.o. year old male  presents with his wife for OV today.  I reviewed Dr. Samella Parr OV note dated 06/26/2015.   He is here today to f/u Hypertension.  He is also here to recheck UA after completing abx to treat UTI.   Wife brings in BP log. Has been checking BP every day.  06/27/2015---165/118 6/24----------132/82 6/25----------159/60 6/26---------170/93 6/26--------156/78  They report that he took the last dose of his antibiotic Sat 06/28/15.  No new complaints or concerns today.   Past Medical History  Diagnosis Date  . MS (multiple sclerosis) ([place])   . Arthritis     s/p TKR  . Right bundle branch block   . Brugada syndrome     Possible Type II Brugada ECG pattern. No family history of SCD, no syncope, no tachypalpitations.  Marland Kitchen COPD (chronic obstructive pulmonary disease) ([place])     Prior heavy smoker. PFTs (3/11): FVC 87%, FEV1 73%, ratio 0.57, DLCO 75%, TLC 121%. Moderate obstructive defect. PFTs (9/15): Only minimal obstruction, sugPrior heavy smoker. PFTs (3/11): FVC 87%, FEV1 73%, ratio 0.57, DLCO 75%, TLC 121%. Moderate obstructive defect. PFTs (9/15) minimal obstruction, poss asthma component   . GERD (gastroesophageal reflux disease)   . Depression     with bipolar tendencies  . Urinary retention with incomplete bladder emptying     receives botox injections and treatment for BPH through [facility]  . BPH (benign prostatic hyperplasia)   . CAD (coronary artery disease)     a. LHC 1/15 - mid LCx 50 and 60%, proximal RCA 50%  . HLD (hyperlipidemia)   . HTN (hypertension)   . Chronic diastolic CHF (congestive heart failure) ([place])   . LVH (left ventricular hypertrophy)     a. Echo 12/14 Inferior and distal septal HK, mild LVH, EF 50-55%, mild MR, mild LAE  . Bladder cancer (HCC)     [facility], Ta low grade papillary urothileal carcinoma  . DOE (dyspnea on exertion)     a. Myoview 8/09- EF 57%, no ischemia // b. Myoview (3/11) EF 68%, diaphragmatic attenuation, no ischemia //  c Echo (4/11) EF 0000000, mild diastolic dysfunction, PASP 38 mmHg // d. PFTs 9/15 minimal obstruction  /  e. CT negative for ILD  . Mitral regurgitation     Echo (4/11) with PISA ERO 0.3 cm^2 and regurgitant volume 41 mL (moderate MR). Echo (12/14) with mild MR.   Marland Kitchen History of Doppler ultrasound     Carotid US 3/11 negative for significant stenosis  . Mild dementia   . Low testosterone   . CKD (chronic kidney disease)   . Lung nodule     a. CT in 2015 >> PET in 12/15 not sugg of malignancy   . Thoracic aortic aneurysm (HCC)     4.1 cm 2017  . Stroke [facility])      Home Meds: Outpatient Prescriptions Prior to Visit  Medication Sig Dispense Refill  . ARIPiprazole (ABILIFY) 5 MG tablet TAKE 1 TABLET DAILY 90 tablet 2  . atorvastatin (LIPITOR) 20 MG tablet TAKE 1 TABLET DAILY 90 tablet 3  . bisoprolol (ZEBETA) 5 MG tablet TAKE ONE-HALF (1/2) TABLET BY MOUTH ONCE  DAILY    . cephALEXin (KEFLEX) 500 MG capsule Take 1 capsule (500 mg total) by mouth  2 (two) times daily. 6 capsule 0  . clopidogrel (PLAVIX) 75 MG tablet Take 1 tablet (75 mg total) by mouth daily. 30 tablet 5  . furosemide (LASIX) 40 MG tablet Take 1 tablet (40 mg total) by mouth daily as needed (swelling). Please keep upcoming appointment for further refills 180 tablet 0  . HYDROcodone-acetaminophen (NORCO) 10-325 MG tablet Take 1 tablet by mouth every 6 (six) hours as needed for moderate pain or severe pain. 30 tablet 0  . omeprazole (PRILOSEC) 20 MG capsule Take 1 capsule (20 mg total) by mouth daily. 90 capsule 3  . oxybutynin (DITROPAN-XL) 5 MG 24 hr tablet Take 5 mg by mouth at bedtime.    . predniSONE (DELTASONE) 10 MG tablet Take 10 mg by mouth daily as needed (FOR PAIN).    Marland Kitchen PROAIR HFA 108 (90 BASE) MCG/ACT inhaler USE 1 TO 2  INHALATIONS EVERY 6 HOURS AS NEEDED FOR WHEEZING OR SHORTNESS OF BREATH 25.5 g 4  . rOPINIRole (REQUIP) 1 MG tablet Take 1 mg by mouth at bedtime.     Marland Kitchen SPIRIVA HANDIHALER 18 MCG inhalation capsule INHALE THE CONTENTS OF 1 CAPSULE WITH 2         INHALATIONS ONCE DAILY AS DIRECTED 1 capsule 11  . venlafaxine XR (EFFEXOR-XR) 150 MG 24 hr capsule Take 150 mg by mouth 2 (two) times daily.     . vitamin B-12 (CYANOCOBALAMIN) 1000 MCG tablet Take 1,000 mcg by mouth daily.    Marland Kitchen amLODipine (NORVASC) 5 MG tablet Take 5 mg by mouth daily.     No facility-administered medications prior to visit.    Allergies:  Allergies  Allergen Reactions  . Acetylcholine     unknown  . Alcohol-Sulfur [Sulfur]     unknown  . Amitriptyline     unknown  . Bupivacaine     unknown  . Clomipramine Hcl     unknown  . Cocaine     unknown  . Desipramine     unknown  . Ergonovine     unknown  . Flecainide     unknown  . Lithium     unknown  . Loxapine     unknown  . Nortriptyline     unknown  . Oxcarbazepine     unknown  . Procainamide     unknown  . Procaine     unknwon  . Propafenone     unknown  . Propofol     unknown  . Trifluoperazine     unknown    Social History   Social History  . Marital Status: Married    Spouse Name: N/A  . Number of Children: N/A  . Years of Education: N/A   Occupational History  . retired    Social History Main Topics  . Smoking status: Former Smoker -- 2.00 packs/day for 40 years    Types: Cigarettes    Quit date: 01/04/1993  . Smokeless tobacco: Former Systems developer    Types: Chew  . Alcohol Use: No     Comment: 6 pack/day - 06/20/15 Pt states wuit drinking in the 1970's  . Drug Use: No  . Sexual Activity: Not on file   Other Topics Concern  . Not on file   Social History Narrative   Retired from the [employer] after 20 years   [place] Veteran    Family History  Problem Relation Age of Onset  . Stroke Mother   . Stroke Father   . Hypertension  Sister   .  Kidney cancer Brother   . Arthritis Brother   . Other Brother     knee problems, Bil TKR  . Hypertension      family history     Review of Systems:  See HPI for pertinent ROS. All other ROS negative.    Physical Exam: Blood pressure 140/72, pulse 82, temperature 98.3 F (36.8 C), temperature source Oral, resp. rate 16, height 5\' 11"  (1.803 m), weight 218 lb (98.884 kg)., Body mass index is 30.42 kg/(m^2). General: WNWD WM. Appears in no acute distress. Neck: Supple. No thyromegaly. No lymphadenopathy. Lungs: Clear bilaterally to auscultation without wheezes, rales, or rhonchi. Breathing is unlabored. Heart: RRR with S1 S2. No murmurs, rubs, or gallops. Musculoskeletal:  Strength and tone normal for age. Extremities/Skin: Warm and dry. No LE Edema.  Neuro: Alert and oriented X 3. Moves all extremities spontaneously. Gait is normal. CNII-XII grossly in tact. Psych:  Responds to questions appropriately with a normal affect.     ASSESSMENT AND PLAN:  75 y.o. year old male with  1. Essential hypertension BP readings at home are high.He has no LE edema on exam.  Increase Norvasc to 10mg  QD. Recommend that they bring home BP cuff to next OV for [place] to check to make sure it is accurate.  He is to schedule f/u OV with Dr. Dennard Schaumann in 1 -2 weeks.  - amLODipine (NORVASC) 10 MG tablet; Take 1 tablet (10 mg total) by mouth daily.  Dispense: 30 tablet; Refill: 3  2. Urinary tract infection, site not specified UA normal today. No sign of residual infxn. Also, I reviewed on hospital d/c summary--they reviewed culture results prior to hospital discharge.   Marin Olp [place], [place], [facility] 06/30/2015 2:58 PM

## 2015-07-01 ENCOUNTER — Other Ambulatory Visit: Payer: Self-pay | Admitting: *Deleted

## 2015-07-01 NOTE — Patient Outreach (Signed)
[facility]) Care Management  07/01/2015  ALEZANDER LYCETT [birth year] [id number]  EMMI-Stroke referral via dashboard report-red on new or worsening pain/fever/shortness of breath on 06/28/2015:  Telephone call to patient; left messages requesting call back on home & mobile phones.  Plan:  Will follow up.   Sherrin Daisy, RN BSN [place] Management Coordinator [facility] Care Management  [phone]

## 2015-07-02 ENCOUNTER — Other Ambulatory Visit: Payer: Self-pay | Admitting: *Deleted

## 2015-07-02 DIAGNOSIS — N390 Urinary tract infection, site not specified: Secondary | ICD-10-CM | POA: Diagnosis not present

## 2015-07-02 LAB — URINALYSIS, ROUTINE W REFLEX MICROSCOPIC
BILIRUBIN URINE: NEGATIVE
GLUCOSE, UA: NEGATIVE
HGB URINE DIPSTICK: NEGATIVE
KETONES UR: NEGATIVE
LEUKOCYTES UA: NEGATIVE
Nitrite: NEGATIVE
PH: 5 (ref 5.0–8.0)
PROTEIN: NEGATIVE
Specific Gravity, Urine: 1.01 (ref 1.001–1.035)

## 2015-07-02 NOTE — Patient Outreach (Signed)
[facility]) Care Management  07/02/2015  [name] [name] Jan 13, 1940 [id number]   EMMI-Stroke referral from Dashboard-red on new or worsening symptoms-pain/fever/shortness of breath on 06/28/15.  Telephone call to patient who was advised of reason for call.  Patient voices that he is currently not having any new or worsening symptoms since he left the hospital. States she does recall answering that question.   States he was admitted to hospital from 06/11-6/14 with trouble speaking, recall and fine motor skills. States he was  diagnosed with stroke. States symptoms cleared up while he was in hospital and he was discharged home with spouse who is a retried Marine scientist. States he was readmitted 6/16 because he passed out & collapsed while walking his dog. States he was advised that he was extremely dehydrated and that he did not have another stroke. States currently he is feeling well and has not had any signs of stroke.  Patient was able to state 6 stroke symptoms and action plan of calling 911.   Voices that he is independent with self care and had not had any falls since his last hospital stay. States he manages his own medications and is compliant with taking them as instructed by his doctors. States he has had follow up with primary care provider since hospital stay. Voices that his spouse takes him to MD office visits. States he has not been recommended any home or outpatient therapy. States he does not have any activity limitations.   Voices that he has support from his spouse as or if needed.  Patient voices no health concerns at this time. States he does not need any additional educational materials on stroke.  EMMI-Stroke dashboard has been addressed.  Plan:   Close case. Send to care management assistant.  Sherrin Daisy, RN BSN [place] Management Coordinator [facility] Care Management  [phone]

## 2015-07-02 NOTE — Addendum Note (Signed)
Addended by: Louis Matte on: 07/02/2015 08:42 AM   Modules accepted: Orders

## 2015-07-04 ENCOUNTER — Encounter: Payer: Self-pay | Admitting: Physical Medicine & Rehabilitation

## 2015-07-04 ENCOUNTER — Encounter: Payer: Medicare Other | Attending: Physical Medicine & Rehabilitation | Admitting: Physical Medicine & Rehabilitation

## 2015-07-04 VITALS — BP 146/82 | HR 77

## 2015-07-04 DIAGNOSIS — Z8551 Personal history of malignant neoplasm of bladder: Secondary | ICD-10-CM | POA: Insufficient documentation

## 2015-07-04 DIAGNOSIS — F0390 Unspecified dementia without behavioral disturbance: Secondary | ICD-10-CM | POA: Diagnosis not present

## 2015-07-04 DIAGNOSIS — R4189 Other symptoms and signs involving cognitive functions and awareness: Secondary | ICD-10-CM | POA: Diagnosis not present

## 2015-07-04 DIAGNOSIS — Z8673 Personal history of transient ischemic attack (TIA), and cerebral infarction without residual deficits: Secondary | ICD-10-CM | POA: Insufficient documentation

## 2015-07-04 DIAGNOSIS — I130 Hypertensive heart and chronic kidney disease with heart failure and stage 1 through stage 4 chronic kidney disease, or unspecified chronic kidney disease: Secondary | ICD-10-CM | POA: Diagnosis not present

## 2015-07-04 DIAGNOSIS — Z9889 Other specified postprocedural states: Secondary | ICD-10-CM | POA: Diagnosis not present

## 2015-07-04 DIAGNOSIS — I5032 Chronic diastolic (congestive) heart failure: Secondary | ICD-10-CM | POA: Insufficient documentation

## 2015-07-04 DIAGNOSIS — F329 Major depressive disorder, single episode, unspecified: Secondary | ICD-10-CM | POA: Diagnosis not present

## 2015-07-04 DIAGNOSIS — R251 Tremor, unspecified: Secondary | ICD-10-CM | POA: Insufficient documentation

## 2015-07-04 DIAGNOSIS — E785 Hyperlipidemia, unspecified: Secondary | ICD-10-CM | POA: Insufficient documentation

## 2015-07-04 DIAGNOSIS — I69919 Unspecified symptoms and signs involving cognitive functions following unspecified cerebrovascular disease: Secondary | ICD-10-CM

## 2015-07-04 DIAGNOSIS — F3176 Bipolar disorder, in full remission, most recent episode depressed: Secondary | ICD-10-CM | POA: Diagnosis not present

## 2015-07-04 DIAGNOSIS — I2510 Atherosclerotic heart disease of native coronary artery without angina pectoris: Secondary | ICD-10-CM | POA: Diagnosis not present

## 2015-07-04 DIAGNOSIS — I639 Cerebral infarction, unspecified: Secondary | ICD-10-CM

## 2015-07-04 DIAGNOSIS — Z87891 Personal history of nicotine dependence: Secondary | ICD-10-CM | POA: Diagnosis not present

## 2015-07-04 DIAGNOSIS — I712 Thoracic aortic aneurysm, without rupture: Secondary | ICD-10-CM | POA: Diagnosis not present

## 2015-07-04 DIAGNOSIS — G35 Multiple sclerosis: Secondary | ICD-10-CM | POA: Diagnosis not present

## 2015-07-04 DIAGNOSIS — K219 Gastro-esophageal reflux disease without esophagitis: Secondary | ICD-10-CM | POA: Diagnosis not present

## 2015-07-04 DIAGNOSIS — M1990 Unspecified osteoarthritis, unspecified site: Secondary | ICD-10-CM | POA: Insufficient documentation

## 2015-07-04 DIAGNOSIS — F32A Depression, unspecified: Secondary | ICD-10-CM

## 2015-07-04 DIAGNOSIS — I4510 Unspecified right bundle-branch block: Secondary | ICD-10-CM | POA: Diagnosis not present

## 2015-07-04 DIAGNOSIS — Z96653 Presence of artificial knee joint, bilateral: Secondary | ICD-10-CM | POA: Diagnosis not present

## 2015-07-04 DIAGNOSIS — J449 Chronic obstructive pulmonary disease, unspecified: Secondary | ICD-10-CM | POA: Diagnosis not present

## 2015-07-04 DIAGNOSIS — N400 Enlarged prostate without lower urinary tract symptoms: Secondary | ICD-10-CM | POA: Diagnosis not present

## 2015-07-04 MED ORDER — AMANTADINE HCL 100 MG PO CAPS: 100.0000 mg | ORAL_CAPSULE | Freq: Two times a day (BID) | ORAL | Status: DC

## 2015-07-04 NOTE — Telephone Encounter (Signed)
Finally spoke to wife and his BP has actually been better taking the Amlopidine so that will keep a watch on it and discuss at next ov

## 2015-07-04 NOTE — Progress Notes (Signed)
Subjective:    Patient ID: [name] [name], male    DOB: 30-Jan-1940, 75 y.o.   MRN: [id number]  HPI  75 y/o with pmh of multiple sclerosis followed by neurology services at [facility] - no current medications, chronic diastolic congestive heart failure, hypertension, CAD maintained on aspirin 81 mg daily, COPD, BPH (cont meds), depression/bipolar presents for follow after right MCA stroke.  He was high functioning at discharge and did not require CIR services.  He has been doing well since he left the hospital.  He denies falls. He notes some word finding difficulties.  His wife seems to indicate some higher level cognitive deficits, but pt denies difficulties.  He is currently not in therapies.   Pain Inventory Average Pain 5 Pain Right Now 5 My pain is constant and burning  In the last 24 hours, has pain interfered with the following? General activity 5 Relation with others 0 Enjoyment of life 10 What TIME of day is your pain at its worst? all Sleep (in general) Good  Pain is worse with: sitting and inactivity Pain improves with: pacing activities and medication Relief from Meds: 7  Mobility walk without assistance how many minutes can you walk? 20-30 ability to climb steps?  yes do you drive?  yes  Function retired  Neuro/Psych bladder control problems weakness tremor depression anxiety  Prior Studies CT/MRI hospital f/u  Physicians involved in your care hospital follow up   Family History  Problem Relation Age of Onset  . Stroke Mother   . Stroke Father   . Hypertension Sister   . Kidney cancer Brother   . Arthritis Brother   . Other Brother     knee problems, Bil TKR  . Hypertension      family history   Social History   Social History  . Marital Status: Married    Spouse Name: N/A  . Number of Children: N/A  . Years of Education: N/A   Occupational History  . retired    Social History Main Topics  . Smoking status: Former  Smoker -- 2.00 packs/day for 40 years    Types: Cigarettes    Quit date: 01/04/1993  . Smokeless tobacco: Former Systems developer    Types: Chew  . Alcohol Use: No     Comment: 6 pack/day - 06/20/15 Pt states wuit drinking in the 1970's  . Drug Use: No  . Sexual Activity: Not Asked   Other Topics Concern  . None   Social History Narrative   Retired from the [employer] after 20 years   [place] Veteran   Past Surgical History  Procedure Laterality Date  . Total knee arthroplasty Right   . Transurethral resection of prostate    . Tonsillectomy    . Appendectomy    . Rotator cuff repair    . Hemorrhoid surgery    . Left heart catheterization with coronary angiogram N/A 01/05/2013    Procedure: LEFT HEART CATHETERIZATION WITH CORONARY ANGIOGRAM;  Surgeon: Larey Dresser, MD;  Location: [facility] CATH LAB;  Service: Cardiovascular;  Laterality: N/A;  . Ep implantable device N/A 06/23/2015    Procedure: Loop Recorder Insertion;  Surgeon: Deboraha Sprang, MD;  Location: [place] CV LAB;  Service: Cardiovascular;  Laterality: N/A;  . Tee without cardioversion N/A 06/23/2015    Procedure: TRANSESOPHAGEAL ECHOCARDIOGRAM (TEE);  Surgeon: Fay Records, MD;  Location: [facility] ENDOSCOPY;  Service: Cardiovascular;  Laterality: N/A;   Past Medical History  Diagnosis Date  .  MS (multiple sclerosis) ([place])   . Arthritis     s/p TKR  . Right bundle branch block   . Brugada syndrome     Possible Type II Brugada ECG pattern. No family history of SCD, no syncope, no tachypalpitations.  Marland Kitchen COPD (chronic obstructive pulmonary disease) ([place])     Prior heavy smoker. PFTs (3/11): FVC 87%, FEV1 73%, ratio 0.57, DLCO 75%, TLC 121%. Moderate obstructive defect. PFTs (9/15): Only minimal obstruction, sugPrior heavy smoker. PFTs (3/11): FVC 87%, FEV1 73%, ratio 0.57, DLCO 75%, TLC 121%. Moderate obstructive defect. PFTs (9/15) minimal obstruction, poss asthma component   . GERD (gastroesophageal reflux disease)   . Depression      with bipolar tendencies  . Urinary retention with incomplete bladder emptying     receives botox injections and treatment for BPH through [facility]  . BPH (benign prostatic hyperplasia)   . CAD (coronary artery disease)     a. LHC 1/15 - mid LCx 50 and 60%, proximal RCA 50%  . HLD (hyperlipidemia)   . HTN (hypertension)   . Chronic diastolic CHF (congestive heart failure) ([place])   . LVH (left ventricular hypertrophy)     a. Echo 12/14 Inferior and distal septal HK, mild LVH, EF 50-55%, mild MR, mild LAE  . Bladder cancer (HCC)     [facility], Ta low grade papillary urothileal carcinoma  . DOE (dyspnea on exertion)     a. Myoview 8/09- EF 57%, no ischemia // b. Myoview (3/11) EF 68%, diaphragmatic attenuation, no ischemia //  c Echo (4/11) EF 0000000, mild diastolic dysfunction, PASP 38 mmHg // d. PFTs 9/15 minimal obstruction  /  e. CT negative for ILD  . Mitral regurgitation     Echo (4/11) with PISA ERO 0.3 cm^2 and regurgitant volume 41 mL (moderate MR). Echo (12/14) with mild MR.   Marland Kitchen History of Doppler ultrasound     Carotid US 3/11 negative for significant stenosis  . Mild dementia   . Low testosterone   . CKD (chronic kidney disease)   . Lung nodule     a. CT in 2015 >> PET in 12/15 not sugg of malignancy   . Thoracic aortic aneurysm (HCC)     4.1 cm 2017  . Stroke ([place])    BP 146/82 mmHg  Pulse 77  SpO2 90%  Opioid Risk Score:   Fall Risk Score:  `1  Depression screen PHQ 2/9  Depression screen PHQ 2/9 07/02/2015  Decreased Interest 0  Down, Depressed, Hopeless 0  PHQ - 2 Score 0     Review of Systems  HENT: Negative.   Eyes: Negative.   Respiratory: Positive for shortness of breath.   Cardiovascular: Negative.   Gastrointestinal: Positive for constipation.  Endocrine: Negative.   Genitourinary: Positive for frequency.  Musculoskeletal: Positive for arthralgias.  Skin: Negative.   Allergic/Immunologic: Negative.   Neurological: Positive for tremors.  Hematological:  Negative.   Psychiatric/Behavioral: Positive for dysphoric mood. The patient is nervous/anxious.   All other systems reviewed and are negative.     Objective:   Physical Exam Constitutional: He appears well-developed and well-nourished. NAD.  HENT:  Normocephalic and atraumatic. HOH. Eyes: Conjunctivae and EOM are normal.   Cardiovascular: Normal rate and regular rhythm.  Murmur heard. Respiratory: Effort normal and breath sounds normal. No respiratory distress.  GI: Soft. Bowel sounds are normal. He exhibits no distension.  Musculoskeletal: He exhibits no edema or tenderness.  Neurological: He is alert and oriented to person, place,  and time.  He has some mild decrease in fine motor skills.  Right facial weakness Motor: 4+/5 grossly throughout Sensation intact to light touch DTRs 3+ b/l LE Brief MMS: Some mild deficits in attention/concentration Skin: Skin is warm and dry.  Psychiatric: He has a normal mood and affect. His behavior is normal. Thought content normal.     Assessment & Plan:  75 y/o with pmh of multiple sclerosis followed by neurology services at [facility] - no current medications, chronic diastolic congestive heart failure, hypertension, CAD maintained on aspirin 81 mg daily, COPD, BPH (cont meds), depression/bipolar presents for follow after right MCA stroke.    1. 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  Pt also some word finding difficulties as well.    Will order SLP  2. Multiple Sclerosis  Cont to follow at [facility]  Likely contributing to pt's fatigue and lack of desire to engage in activities  Pt has tried Provigil without benefit  Will start amantadine 100 BID with breakfast and lunch  Will consider Rital based on efficacy   3. Depression/Bipolar  Cont Abilify per PCP  Will monitor for interactions  4. COPD  Contributing to poor endurance  Will try other interventions and  consider further workup if necessary

## 2015-07-07 ENCOUNTER — Ambulatory Visit (INDEPENDENT_AMBULATORY_CARE_PROVIDER_SITE_OTHER): Payer: Medicare Other | Admitting: *Deleted

## 2015-07-07 DIAGNOSIS — Z95818 Presence of other cardiac implants and grafts: Secondary | ICD-10-CM

## 2015-07-07 LAB — CUP PACEART INCLINIC DEVICE CHECK: MDC IDC SESS DTM: 20170703115608

## 2015-07-07 NOTE — Progress Notes (Signed)
Loop wound check in clinic. Steri-strips previously removed. Incision well healed; no redness, swelling or drainage. Patient educated about wound care and Carelink monitoring. [name] [name] is awaiting a cellular extender from [employer] due to low cellular signal at home. Battery status: good. No episodes. Monthly summary reports (pending Carelink connection) and ROV with SK in 12 months.

## 2015-07-10 ENCOUNTER — Ambulatory Visit: Payer: TRICARE For Life (TFL) | Admitting: Internal Medicine

## 2015-07-14 ENCOUNTER — Ambulatory Visit: Payer: Medicare Other | Admitting: Family Medicine

## 2015-07-14 DIAGNOSIS — H04123 Dry eye syndrome of bilateral lacrimal glands: Secondary | ICD-10-CM | POA: Diagnosis not present

## 2015-07-14 DIAGNOSIS — H25013 Cortical age-related cataract, bilateral: Secondary | ICD-10-CM | POA: Diagnosis not present

## 2015-07-14 DIAGNOSIS — H2513 Age-related nuclear cataract, bilateral: Secondary | ICD-10-CM | POA: Diagnosis not present

## 2015-07-14 DIAGNOSIS — H40013 Open angle with borderline findings, low risk, bilateral: Secondary | ICD-10-CM | POA: Diagnosis not present

## 2015-07-15 ENCOUNTER — Telehealth: Payer: Self-pay

## 2015-07-15 ENCOUNTER — Encounter: Payer: Self-pay | Admitting: Family Medicine

## 2015-07-15 ENCOUNTER — Ambulatory Visit (INDEPENDENT_AMBULATORY_CARE_PROVIDER_SITE_OTHER): Payer: Medicare Other | Admitting: Family Medicine

## 2015-07-15 VITALS — BP 138/74 | HR 86 | Temp 98.4°F | Resp 18 | Ht 71.0 in | Wt 211.0 lb

## 2015-07-15 DIAGNOSIS — I10 Essential (primary) hypertension: Secondary | ICD-10-CM | POA: Diagnosis not present

## 2015-07-15 DIAGNOSIS — I639 Cerebral infarction, unspecified: Secondary | ICD-10-CM | POA: Diagnosis not present

## 2015-07-15 NOTE — Progress Notes (Signed)
Subjective:    Patient ID: [name] [name], male    DOB: 05-Jul-1940, 75 y.o.   MRN: [id number]  HPI  Recently admitted to hospital.  I have copied relevant portions of the DC summary below for reference: Admit date: 06/20/2015 Discharge date: 06/23/2015  Time spent: >56mins  Recommendations for Outpatient Follow-up:  1. F/u with PMD within a week for hospital discharge follow up, repeat cbc/bmp at follow up, patient is advised to check blood pressure at home and bring record to hospital discharge appointment 2. F/u with neurology Dr Lottie Rater and [facility] cardiology and [facility] as already scheduled.  Discharge Diagnoses:  Active Hospital Problems   Diagnosis Date Noted  . Syncope and collapse   . Dysarthria   . Acute CVA (cerebrovascular accident) ([place])   . Acute cystitis without hematuria   . Stroke ([place])   . Slurred speech 06/20/2015  . History of CVA (cerebrovascular accident) 06/20/2015  . AKI (acute kidney injury) ([place]) 06/20/2015  . Gastroesophageal reflux disease without esophagitis 06/05/2015  . Hyperlipidemia 06/05/2015  . COPD (chronic obstructive pulmonary disease) ([place]) 09/04/2013  . Chronic diastolic CHF (congestive heart failure) ([place]) 01/03/2013  . Depression   . Essential hypertension 04/08/2009  . Multiple sclerosis ([place]) 10/11/2006    Resolved Hospital Problems   Diagnosis Date Noted Date Resolved  No resolved problems to display.    Discharge Condition: stable  Diet recommendation: heart healthy  Filed Weights   06/20/15 0946  Weight: 99.791 kg (220 lb)    History of present illness:  Chief Complaint: slurred speech, weakness  HPI: [name] [name] is a 75 y.o. male with medical history significant for CAD, chronic diastolic CHF, COPD . Patient was hospitalized a few days ago with an acute right MCA stroke. He was discharged home with plans for outpatient PT, outpatient  TEE and possibly Holter monitor. Patient did well for the two days he was home. This morning he took dog for a walk. A neighbor called the wife to report that she had found patient sitting on the ground in the neighborhood. Patient states he tried to walk too far, became fatigued and had to sit down. When patient's wife arrived she found him with worsening slurred speech and worsening dysarthria. Patient saw PCP this a.m., he was transported by EMS from there to the ED. Since receiving a liter of fluid his neurologic symptoms have already improved though he is not back to baseline per wife.   ED Course:  Afebrile, hemodynamically stable. Normal oxygen saturation. WBC 11.1, Cr 28, 1.64 No acute changes on head CT Urinalysis: Many bacteria, positive nitrites, wbc too numerous to count EKG Sinus rhythm IVCD, consider atypical RBBB Borderline ST elevation, anterior leads  Hospital Course:  Active Problems:  Multiple sclerosis (HCC)  Essential hypertension  Depression  Chronic diastolic CHF (congestive heart failure) (HCC)  COPD (chronic obstructive pulmonary disease) (HCC)  Hyperlipidemia  Gastroesophageal reflux disease without esophagitis  Slurred speech  History of CVA (cerebrovascular accident)  AKI (acute kidney injury) ([name] [name])  Acute cystitis without hematuria  Stroke [facility])  Dysarthria  Acute CVA (cerebrovascular accident) ([name] [name])  Syncope and collapse   Stroke like symptoms. He is s/p recent right MCA stroke (embolic?) with residual slurred speech at baseline. with worsening slurred speech / fatigue / dysarthria possibly secondary to dehydration and / or UTI vrs progression of stroke. Slurred speech improved after liter of fluid though not back to baseline -MRI brain w and wo contrast on admission  showed progression of ischemia in the superior right insula -no weakness in extremities, no need of PT/OT, speech has improved, passed bedside swallow eval -neurology  consulted, input appreciated, TEE/loop recorder on Monday per neuro recommendation -per neuro can discharge patient on plavix, continue statin, no need of asa  Dehydation / UTI / AKI. Baseline creatinine 1.06, up to 1.64 on admission, likely secondary to volume depletion and uti -Ivf, abx, hold lasix until renal function improves -hold ditropan - urine culture + klebsiella, sensitive to rocephin -discharged on keflex for additional 3days to finish total of 7day abx treatment for complicated uti.  Multiple sclerosis ([name] [name]). No flares in a few years per wife. Not on treatment. -Continue Requip for restless legs  Essential hypertension,  continue beta blocker, norvasc restarted at discharge, avoid hypotension, bp goal normal tensive, pmd to continue monitor blood pressure control   Chronic diastolic CHF (congestive heart failure). Grade I diastolic dysfunction. LVEF 60-65% on echo 3 days ago . No evidence for volume overload.  -Needs IVF for dehydration in the hospital -betablocker and norvasc held in the hospital for permissive hypertension in the setting of acute stroke --continue beta blocker, norvasc restarted at discharge, avoid hypotension, bp goal normal tensive, pmd to continue monitor blood pressure control  COPD (chronic obstructive pulmonary disease) ([name] [name]). Stable.  -continue home proventil and spriva Lung clear   Hyperlipidemia -continue home statin  Gastroesophageal reflux disease  -continue home PPI  Dehydration with acute kidney injury, dry mucous membranes. . -Received fluids bolus and maintenance fluids -Hold home Lasix ( he is on prn lasix at home) -cr normalized  COPD, stable. . -continue home Proventilair, [name] [name]. . Stable  -Continue home effexor, abilify  H/o bladder cancer and bladder spasm: close followed with urology at [name]. Patient report he does self cath about one a month.   Code Status: DNR Family  Communication: patient and his wife in room Disposition Plan: Discharge home after tee/loop record placement on 6/19  Consults called: neurology/cardiology   Procedures: tee/loop record placement on 6/19     He is here for follow up.  He has been taking aspirin 325 mg a day and Plavix since discharge. I explained to both the patient and the wife that he only needs to be on Plavix. Otherwise he has been doing well since hospital discharge. Based on his baseline, his thought processes do seem to be somewhat delayed. He does seem to have some mild memory impairment since the stroke. For instance yesterday he was pumping gas in the car and forgot to remove the handle from the gas tank. When he pulled away from the pump, he ripped the handle off. His wife is driving. He is no longer driving. However this is not the patient's baseline. He still has 3 days on his antibiotics. He denies any fevers or chills. His blood pressure at home has been between 130 and 140. Diastolic blood pressures have been excellent. I am satisfied with these blood pressures I want to avoid hypotension given his recent CVA. Otherwise he is doing well without concerns.  At that time, my plan was: Overall, the patient looks much better than he did prior to his recent hospitalization. I want him to monitor his blood pressure 2-3 times a day and recheck with me next week. Ideally I would like his blood pressure to be less than [id number] systolic. I would like him to return next week for urine sample off antibiotics to ensure resolution of his urinary  tract infection. I emphasized to the patient he needs to stop aspirin and only take Plavix. We will recheck a fasting lipid panel and hemoglobin A1c in 3 months. He will follow-up with his neurologist in August.  07/15/15 He is here today for follow-up. His blood pressure at home has been averaging between [id number] and 0000000 systolic over [id number] diastolic. Otherwise he is doing well with no  concerns. Past Medical History  Diagnosis Date  . MS (multiple sclerosis) ([place])   . Arthritis     s/p TKR  . Right bundle branch block   . Brugada syndrome     Possible Type II Brugada ECG pattern. No family history of SCD, no syncope, no tachypalpitations.  Marland Kitchen COPD (chronic obstructive pulmonary disease) ([place])     Prior heavy smoker. PFTs (3/11): FVC 87%, FEV1 73%, ratio 0.57, DLCO 75%, TLC 121%. Moderate obstructive defect. PFTs (9/15): Only minimal obstruction, sugPrior heavy smoker. PFTs (3/11): FVC 87%, FEV1 73%, ratio 0.57, DLCO 75%, TLC 121%. Moderate obstructive defect. PFTs (9/15) minimal obstruction, poss asthma component   . GERD (gastroesophageal reflux disease)   . Depression     with bipolar tendencies  . Urinary retention with incomplete bladder emptying     receives botox injections and treatment for BPH through [facility]  . BPH (benign prostatic hyperplasia)   . CAD (coronary artery disease)     a. LHC 1/15 - mid LCx 50 and 60%, proximal RCA 50%  . HLD (hyperlipidemia)   . HTN (hypertension)   . Chronic diastolic CHF (congestive heart failure) ([place])   . LVH (left ventricular hypertrophy)     a. Echo 12/14 Inferior and distal septal HK, mild LVH, EF 50-55%, mild MR, mild LAE  . Bladder cancer (HCC)     [facility], Ta low grade papillary urothileal carcinoma  . DOE (dyspnea on exertion)     a. Myoview 8/09- EF 57%, no ischemia // b. Myoview (3/11) EF 68%, diaphragmatic attenuation, no ischemia //  c Echo (4/11) EF 0000000, mild diastolic dysfunction, PASP 38 mmHg // d. PFTs 9/15 minimal obstruction  /  e. CT negative for ILD  . Mitral regurgitation     Echo (4/11) with PISA ERO 0.3 cm^2 and regurgitant volume 41 mL (moderate MR). Echo (12/14) with mild MR.   Marland Kitchen History of Doppler ultrasound     Carotid US 3/11 negative for significant stenosis  . Mild dementia   . Low testosterone   . CKD (chronic kidney disease)   . Lung nodule     a. CT in 2015 >> PET in 12/15 not sugg of  malignancy   . Thoracic aortic aneurysm (HCC)     4.1 cm 2017  . Stroke [facility])    Past Surgical History  Procedure Laterality Date  . Total knee arthroplasty Right   . Transurethral resection of prostate    . Tonsillectomy    . Appendectomy    . Rotator cuff repair    . Hemorrhoid surgery    . Left heart catheterization with coronary angiogram N/A 01/05/2013    Procedure: LEFT HEART CATHETERIZATION WITH CORONARY ANGIOGRAM;  Surgeon: Larey Dresser, MD;  Location: [facility] CATH LAB;  Service: Cardiovascular;  Laterality: N/A;  . Ep implantable device N/A 06/23/2015    Procedure: Loop Recorder Insertion;  Surgeon: Deboraha Sprang, MD;  Location: [facility] CV LAB;  Service: Cardiovascular;  Laterality: N/A;  . Tee without cardioversion N/A 06/23/2015    Procedure: TRANSESOPHAGEAL ECHOCARDIOGRAM (TEE);  Surgeon:  Fay Records, MD;  Location: [facility] ENDOSCOPY;  Service: Cardiovascular;  Laterality: N/A;   Current Outpatient Prescriptions on File Prior to Visit  Medication Sig Dispense Refill  . amantadine (SYMMETREL) 100 MG capsule Take 1 capsule (100 mg total) by mouth 2 (two) times daily with breakfast and lunch. 60 capsule 0  . amLODipine (NORVASC) 10 MG tablet Take 1 tablet (10 mg total) by mouth daily. 30 tablet 3  . ARIPiprazole (ABILIFY) 5 MG tablet TAKE 1 TABLET DAILY 90 tablet 2  . atorvastatin (LIPITOR) 20 MG tablet TAKE 1 TABLET DAILY 90 tablet 3  . bisoprolol (ZEBETA) 5 MG tablet TAKE ONE-HALF (1/2) TABLET BY MOUTH ONCE  DAILY    . cholecalciferol (VITAMIN D) 400 units TABS tablet Take 400 Units by mouth.    . clopidogrel (PLAVIX) 75 MG tablet Take 1 tablet (75 mg total) by mouth daily. 30 tablet 5  . furosemide (LASIX) 40 MG tablet Take 1 tablet (40 mg total) by mouth daily as needed (swelling). Please keep upcoming appointment for further refills 180 tablet 0  . HYDROcodone-acetaminophen (NORCO) 10-325 MG tablet Take 1 tablet by mouth every 6 (six) hours as needed for moderate pain or severe  pain. 30 tablet 0  . omeprazole (PRILOSEC) 20 MG capsule Take 1 capsule (20 mg total) by mouth daily. 90 capsule 3  . oxybutynin (DITROPAN-XL) 5 MG 24 hr tablet Take 5 mg by mouth at bedtime.    . predniSONE (DELTASONE) 10 MG tablet Take 10 mg by mouth daily as needed (FOR PAIN).    Marland Kitchen PROAIR HFA 108 (90 BASE) MCG/ACT inhaler USE 1 TO 2 INHALATIONS EVERY 6 HOURS AS NEEDED FOR WHEEZING OR SHORTNESS OF BREATH 25.5 g 4  . rOPINIRole (REQUIP) 1 MG tablet Take 1 mg by mouth at bedtime.     Marland Kitchen SPIRIVA HANDIHALER 18 MCG inhalation capsule INHALE THE CONTENTS OF 1 CAPSULE WITH 2         INHALATIONS ONCE DAILY AS DIRECTED 1 capsule 11  . venlafaxine XR (EFFEXOR-XR) 150 MG 24 hr capsule Take 150 mg by mouth 2 (two) times daily.     . vitamin B-12 (CYANOCOBALAMIN) 1000 MCG tablet Take 1,000 mcg by mouth daily.     No current facility-administered medications on file prior to visit.   Allergies  Allergen Reactions  . Acetylcholine     unknown  . Alcohol-Sulfur [Sulfur]     unknown  . Amitriptyline     unknown  . Bupivacaine     unknown  . Clomipramine Hcl     unknown  . Cocaine     unknown  . Desipramine     unknown  . Ergonovine     unknown  . Flecainide     unknown  . Lithium     unknown  . Loxapine     unknown  . Nortriptyline     unknown  . Oxcarbazepine     unknown  . Procainamide     unknown  . Procaine     unknwon  . Propafenone     unknown  . Propofol     unknown  . Trifluoperazine     unknown   Social History   Social History  . Marital Status: Married    Spouse Name: N/A  . Number of Children: N/A  . Years of Education: N/A   Occupational History  . retired    Social History Main Topics  . Smoking status: Former Smoker -- 2.00 packs/day  for 40 years    Types: Cigarettes    Quit date: 01/04/1993  . Smokeless tobacco: Former Systems developer    Types: Chew  . Alcohol Use: No     Comment: 6 pack/day - 06/20/15 Pt states wuit drinking in the 1970's  . Drug Use: No  .  Sexual Activity: Not on file   Other Topics Concern  . Not on file   Social History Narrative   Retired from the [employer] after 20 years   [place] Veteran      Review of Systems  All other systems reviewed and are negative.      Objective:   Physical Exam  Constitutional: He is oriented to person, place, and time.  Cardiovascular: Normal rate, regular rhythm and normal heart sounds.   Pulmonary/Chest: Effort normal and breath sounds normal. No respiratory distress. He has no wheezes. He has no rales.  Abdominal: Soft. Bowel sounds are normal.  Neurological: He is alert and oriented to person, place, and time. He has normal reflexes. No cranial nerve deficit. He exhibits normal muscle tone. Coordination normal.  Psychiatric: He has a normal mood and affect. His speech is normal. He is slowed.  Vitals reviewed.         Assessment & Plan:  Essential hypertension Increase bisoprolol to 5 mg daily and recheck blood pressure in 2 weeks.

## 2015-07-15 NOTE — Telephone Encounter (Signed)
Rn call patients wife about which pharmacy she wants the plavix to be sent. Pts wife stated that they needed the medication that day for plavix when he was discharge from the hospital. Pts wife stated ongoing they will be using [facility] for all their regular routine medications. Pts wife is going today to [facility] to pick up 30 days prescription for plavix. Rn stated on Friday the plavix will be sent to [facility] for 90 days ongoing.Pts wife verbalized understanding.

## 2015-07-16 ENCOUNTER — Telehealth: Payer: Self-pay | Admitting: Cardiology

## 2015-07-16 NOTE — Telephone Encounter (Signed)
Spoke w/ pt wife after calling [employer] tech support to get information about wire - t and ethernet  cord for pt home monitor. The wire-t was ordered today and pt will have it in 7-10 business days. Tech services gave me instructions on how to set up the monitor w/ the wire-t. Called pt wife back and informed her of the information that I had received. Pt wife seemed very stressed about how pt had a stroke and his home monitor has not worked since he had loop recorder implanted. She is stressed that pt can have A-Fib any day and w/o his home monitor working we wouldn't know and the patient could have another stroke b/c of it. Offered pt wife several options. Option 1 - take monitor to town and send the transmission at least weekly until she receives the wire - t. Option 2 to come to the office and have the loop recorder interrogated for episodes since he was checked on 07-07-15. Pt wife agreed to an appt on 07-07-15 at 9:30 AM.

## 2015-07-17 ENCOUNTER — Ambulatory Visit (INDEPENDENT_AMBULATORY_CARE_PROVIDER_SITE_OTHER): Payer: Medicare Other | Admitting: *Deleted

## 2015-07-17 DIAGNOSIS — Z95818 Presence of other cardiac implants and grafts: Secondary | ICD-10-CM

## 2015-07-17 LAB — CUP PACEART INCLINIC DEVICE CHECK: Date Time Interrogation Session: 20170713101656

## 2015-07-17 NOTE — Progress Notes (Signed)
Loop check in clinic. Battery status: good. R-waves 0.22mV. No symptom, tachy, pause, brady, or AF episodes. Patient to go into town weekly to send manual transmissions for review until ethernet cable from [employer] arrives. Monthly summary reports and ROV with SK in 07/2016.

## 2015-07-18 ENCOUNTER — Other Ambulatory Visit: Payer: Self-pay

## 2015-07-18 MED ORDER — CLOPIDOGREL BISULFATE 75 MG PO TABS: 75.0000 mg | ORAL_TABLET | Freq: Every day | ORAL | Status: DC

## 2015-07-18 NOTE — Telephone Encounter (Signed)
Pt wife want plavix sent to [facility] for 90 day supply.

## 2015-07-24 DIAGNOSIS — C678 Malignant neoplasm of overlapping sites of bladder: Secondary | ICD-10-CM | POA: Diagnosis not present

## 2015-07-31 ENCOUNTER — Other Ambulatory Visit: Payer: Self-pay | Admitting: Family Medicine

## 2015-07-31 MED ORDER — AMANTADINE HCL 100 MG PO CAPS
100.0000 mg | ORAL_CAPSULE | Freq: Two times a day (BID) | ORAL | 0 refills | Status: DC
Start: 2015-07-31 — End: 2015-07-31

## 2015-07-31 NOTE — Telephone Encounter (Signed)
Refill appropriate and filled per protocol. 

## 2015-08-01 ENCOUNTER — Encounter: Payer: Medicare Other | Attending: Physical Medicine & Rehabilitation | Admitting: Physical Medicine & Rehabilitation

## 2015-08-01 DIAGNOSIS — E785 Hyperlipidemia, unspecified: Secondary | ICD-10-CM | POA: Insufficient documentation

## 2015-08-01 DIAGNOSIS — F329 Major depressive disorder, single episode, unspecified: Secondary | ICD-10-CM | POA: Insufficient documentation

## 2015-08-01 DIAGNOSIS — K219 Gastro-esophageal reflux disease without esophagitis: Secondary | ICD-10-CM | POA: Insufficient documentation

## 2015-08-01 DIAGNOSIS — I5032 Chronic diastolic (congestive) heart failure: Secondary | ICD-10-CM | POA: Insufficient documentation

## 2015-08-01 DIAGNOSIS — I130 Hypertensive heart and chronic kidney disease with heart failure and stage 1 through stage 4 chronic kidney disease, or unspecified chronic kidney disease: Secondary | ICD-10-CM | POA: Insufficient documentation

## 2015-08-01 DIAGNOSIS — N400 Enlarged prostate without lower urinary tract symptoms: Secondary | ICD-10-CM | POA: Insufficient documentation

## 2015-08-01 DIAGNOSIS — Z96653 Presence of artificial knee joint, bilateral: Secondary | ICD-10-CM | POA: Insufficient documentation

## 2015-08-01 DIAGNOSIS — F0390 Unspecified dementia without behavioral disturbance: Secondary | ICD-10-CM | POA: Insufficient documentation

## 2015-08-01 DIAGNOSIS — R251 Tremor, unspecified: Secondary | ICD-10-CM | POA: Insufficient documentation

## 2015-08-01 DIAGNOSIS — J449 Chronic obstructive pulmonary disease, unspecified: Secondary | ICD-10-CM | POA: Insufficient documentation

## 2015-08-01 DIAGNOSIS — R4189 Other symptoms and signs involving cognitive functions and awareness: Secondary | ICD-10-CM | POA: Insufficient documentation

## 2015-08-01 DIAGNOSIS — Z8673 Personal history of transient ischemic attack (TIA), and cerebral infarction without residual deficits: Secondary | ICD-10-CM | POA: Insufficient documentation

## 2015-08-01 DIAGNOSIS — I712 Thoracic aortic aneurysm, without rupture: Secondary | ICD-10-CM | POA: Insufficient documentation

## 2015-08-01 DIAGNOSIS — I4510 Unspecified right bundle-branch block: Secondary | ICD-10-CM | POA: Insufficient documentation

## 2015-08-01 DIAGNOSIS — M1990 Unspecified osteoarthritis, unspecified site: Secondary | ICD-10-CM | POA: Insufficient documentation

## 2015-08-01 DIAGNOSIS — I2510 Atherosclerotic heart disease of native coronary artery without angina pectoris: Secondary | ICD-10-CM | POA: Insufficient documentation

## 2015-08-01 DIAGNOSIS — Z87891 Personal history of nicotine dependence: Secondary | ICD-10-CM | POA: Insufficient documentation

## 2015-08-01 DIAGNOSIS — Z9889 Other specified postprocedural states: Secondary | ICD-10-CM | POA: Insufficient documentation

## 2015-08-01 DIAGNOSIS — G35 Multiple sclerosis: Secondary | ICD-10-CM | POA: Insufficient documentation

## 2015-08-01 DIAGNOSIS — Z8551 Personal history of malignant neoplasm of bladder: Secondary | ICD-10-CM | POA: Insufficient documentation

## 2015-08-14 ENCOUNTER — Encounter: Payer: Self-pay | Admitting: Neurology

## 2015-08-14 ENCOUNTER — Ambulatory Visit (INDEPENDENT_AMBULATORY_CARE_PROVIDER_SITE_OTHER): Payer: Medicare Other | Admitting: Neurology

## 2015-08-14 VITALS — BP 115/71 | HR 76 | Ht 71.0 in | Wt 211.6 lb

## 2015-08-14 DIAGNOSIS — I712 Thoracic aortic aneurysm, without rupture, unspecified: Secondary | ICD-10-CM

## 2015-08-14 DIAGNOSIS — I10 Essential (primary) hypertension: Secondary | ICD-10-CM

## 2015-08-14 DIAGNOSIS — E785 Hyperlipidemia, unspecified: Secondary | ICD-10-CM | POA: Diagnosis not present

## 2015-08-14 DIAGNOSIS — I639 Cerebral infarction, unspecified: Secondary | ICD-10-CM

## 2015-08-14 DIAGNOSIS — I5032 Chronic diastolic (congestive) heart failure: Secondary | ICD-10-CM

## 2015-08-14 DIAGNOSIS — I63511 Cerebral infarction due to unspecified occlusion or stenosis of right middle cerebral artery: Secondary | ICD-10-CM | POA: Diagnosis not present

## 2015-08-14 DIAGNOSIS — G35 Multiple sclerosis: Secondary | ICD-10-CM

## 2015-08-14 NOTE — Patient Instructions (Addendum)
-   continue plavix and lipitor for stroke prevention - continue follow up loop record - Follow up with your primary care physician for stroke risk factor modification. Recommend maintain blood pressure goal <130/80, diabetes with hemoglobin A1c goal below 7.0% and lipids with LDL cholesterol goal below 70 mg/dL.  - check BP at home, avoid low BP. Keep hydrated  - healthy diet and regular exercise - follow up in 3 months.

## 2015-08-15 NOTE — Progress Notes (Signed)
STROKE NEUROLOGY FOLLOW UP NOTE  NAME: [name] [name] DOB: 1940-03-31  REASON FOR VISIT: stroke follow up HISTORY FROM: pt and wife and chart  Today we had the pleasure of seeing [name] [name] in follow-up at our Neurology [facility]. Pt was accompanied by wife.   History Summary [name] [name] is a 75 y.o. male with history of MS diagnosis in 1963 not on meds, CAD, thoracic AA, history of lung nodule benign, urethral carcinoma following with [facility], diastolic CHF, HTN, HLD and GERD admitted on 06/15/2015 for slurred speech. 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 CT head, carotid Doppler, TTE were all negative. LDL 91 and A1c 6.4. His aspirin 81 changed to 325, resumed Lipitor 20, and he was discharged home outpatient PT OT and plan with outpatient TEE and Loop recorder. However, he was readmitted on 06/20/2015 for presyncope with UTI, generalized weakness, dysarthria and gait instability. MRI showed essentially the same infarct as last admission. Symptoms consider as recrudescence of previous stroke in the setting of infection and presyncope. DVT and TEE negative without PFO. Loop recorder placed. Aspirin was changed to Plavix on discharge.  Interval History During the interval time, the patient has been doing well. Wife complains of mild memory loss after stroke. Blood pressure today 105/71. On Plavix and Lipitor, without side effect. Not candidate for RESPECT ESUS due to urethral cancer.     REVIEW OF SYSTEMS: Full 14 system review of systems performed and notable only for those listed below and in HPI above, all others are negative:  Constitutional:   Cardiovascular: Leg swelling Ear/Nose/Throat:  Hearing loss Skin:  Eyes:   Respiratory:  SOB Gastroitestinal:  Constipation Genitourinary:  Hematology/Lymphatic:   Endocrine: Cold intolerance Musculoskeletal:  Joint pain, joint swelling Allergy/Immunology:     Neurological:  Memory loss, numbness Psychiatric: Depression Sleep: Restless leg, daytime sleepiness, snoring  The following represents the patient's updated allergies and side effects list: Allergies  Allergen Reactions  . Acetylcholine     unknown  . Alcohol-Sulfur [Sulfur]     unknown  . Amitriptyline     unknown  . Bupivacaine     unknown  . Clomipramine Hcl     unknown  . Cocaine     unknown  . Desipramine     unknown  . Ergonovine     unknown  . Flecainide     unknown  . Lithium     unknown  . Loxapine     unknown  . Nortriptyline     unknown  . Oxcarbazepine     unknown  . Procainamide     unknown  . Procaine     unknwon  . Propafenone     unknown  . Propofol     unknown  . Trifluoperazine     unknown    The neurologically relevant items on the patient's problem list were reviewed on today's visit.  Neurologic Examination  A problem focused neurological exam (12 or more points of the single system neurologic examination, vital signs counts as 1 point, cranial nerves count for 8 points) was performed.  Blood pressure 115/71, pulse 76, height 5\' 11"  (1.803 m), weight 211 lb 9.6 oz (96 kg).  General - Well nourished, well developed, in no apparent distress.  Ophthalmologic - Sharp disc margins OU.   Cardiovascular - Regular rate and rhythm with no murmur.  Mental Status -  Level of arousal and orientation to  time, place, and person were intact. Language including expression, naming, repetition, comprehension was assessed and found intact.  Cranial Nerves II - XII - II - Visual field intact OU. III, IV, VI - Extraocular movements intact. V - Facial sensation intact bilaterally. VII - Facial movement intact bilaterally. VIII - Hearing & vestibular intact bilaterally. X - Palate elevates symmetrically. XI - Chin turning & shoulder shrug intact bilaterally. XII - Tongue protrusion intact.  Motor Strength - The patient's strength was normal in  all extremities and pronator drift was absent.  Bulk was normal and fasciculations were absent.   Motor Tone - Muscle tone was assessed at the neck and appendages and was normal.  Reflexes - The patient's reflexes were 1+ in all extremities and he had no pathological reflexes.  Sensory - Light touch, temperature/pinprick, vibration and proprioception, and Romberg testing were assessed and were normal.    Coordination - The patient had normal movements in the hands and feet with no ataxia or dysmetria.  Tremor was absent.  Gait and Station - The patient's transfers, posture, gait, station, and turns were observed as normal.   Functional score  mRS = 1   0 - No symptoms.   1 - No significant disability. Able to carry out all usual activities, despite some symptoms.   2 - Slight disability. Able to look after own affairs without assistance, but unable to carry out all previous activities.   3 - Moderate disability. Requires some help, but able to walk unassisted.   4 - Moderately severe disability. Unable to attend to own bodily needs without assistance, and unable to walk unassisted.   5 - Severe disability. Requires constant nursing care and attention, bedridden, incontinent.   6 - Dead.   NIH Stroke Scale = 0   Data reviewed: I personally reviewed the images and agree with the radiology interpretations.  Ct Head Wo Contrast 06/20/2015   Stable atrophic and ischemic changes. No acute abnormality is noted.   MRI brain 06/15/15 Areas of acute infarction in the region of the insula and posterior frontal lobe on the right consistent with right MCA branch vessel occlusion. No swelling or hemorrhage. Extensive chronic small vessel ischemic changes elsewhere throughout the brain, progressive since the previous exam. Given the history of multiple sclerosis clinically, some of these white matter lesions could relate to chronic demyelination.  [name] [name] Contrast 06/20/2015    Subcentimeter focus of restricted diffusion in the superior RIGHT insula not present previously, representing progression of ischemia. Otherwise stable multifocal areas of acute RIGHT MCA infarction. No interval development of large vessel occlusion, or significant mass effect. Mild postcontrast enhancement, consistent with the subacute time course. There is no significant change on DWI comparing with previous MRI. The "new" DWI signal likely the same DWI as before just due to different cut.   CT Angio Head 06/16/2015 CT HEAD: Evolving small RIGHT frontal/ MCA territory infarct. Moderate to severe white matter changes better characterized on recent MRI of the brain. CTA HEAD: Negative.  2D Echocardiogram 06/16/15 - Left ventricle: The cavity size was normal. There was mild focal basal hypertrophy of the septum. Systolic function was normal. The estimated ejection fraction was in the range of 60% to 65%. Wall motion was normal; there were no regional wall motion abnormalities. Doppler parameters are consistent with abnormal left ventricular relaxation (grade 1 diastolic dysfunction). - Aortic valve: There was mild regurgitation. Impressions: - No cardiac source of emboli was indentified.  Carotid Doppler  06/16/15 There is 1-39% bilateral ICA stenosis. Vertebral artery flow is antegrade.   LE venous doppler - There is no DVT or SVT noted in the bilateral lower extremities.   TEE - normal EF, no SOE, no PFO  CT chest 06/05/15 1. The nodules are stable from 06/26/2013. More spiculated nodules inferomedially in the right middle lobe were not previously positive on PET scan and accordingly are likely benign. The 5 by 6 mm nodule is also stable over the past 2 years. This 2 year stability is compatible with benign process, and based on current guidelines, further follow up surveillance is not required. 2. 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 Recommend annual imaging followup  by CTA or MRA. 3. Coronary, aortic arch, and branch vessel atherosclerotic vascular disease.  Component     Latest Ref Rng & Units 06/16/2015 06/21/2015 06/26/2015  Cholesterol     0 - 200 mg/dL 185 140   Triglycerides     <150 mg/dL 256 (H) 239 (H)   HDL Cholesterol     >40 mg/dL 43 33 (L)   Total CHOL/HDL Ratio     RATIO 4.3 4.2   VLDL     0 - 40 mg/dL 51 (H) 48 (H)   LDL (calc)     0 - 99 mg/dL 91 59   Hemoglobin A1C     <5.7 % 6.4 (H)  6.3 (H)  Mean Plasma Glucose     mg/dL 137  134    Assessment: As you may recall, he is a 75 y.o. Caucasian male with PMH of MS diagnosis in 1963 not on meds, CAD, thoracic AA, history of lung nodule benign, urethral carcinoma following with [facility], diastolic CHF, HTN, HLD and GERD admitted on 06/15/2015 for slurred speech. 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 CT head, carotid Doppler, TTE were all negative. LDL 91 and A1c 6.4. His aspirin 81 changed to 325, resumed Lipitor 20, and he was discharged home outpatient PT OT and plan with outpatient TEE and Loop recorder. However, he was readmitted on 06/20/2015 for presyncope with UTI, generalized weakness, dysarthria and gait instability. MRI showed essentially the same infarct as last admission. Symptoms consider as recrudescence of previous stroke in the setting of infection and presyncope. DVT and TEE negative without PFO. Loop recorder placed. Aspirin was changed to Plavix on discharge. Not candidate for RESPECT ESUS due to urethral cancer. So far loop recording negative for afib.  Regarding previous MS diagnosis in 1960s, patient has not been on any MS medication, no MS relapse, no significant symptoms of MS, I am not sure about the diagnosis, MRI white matter changes could be small vessel etiology instead of chronic demyelination. Do not think he needs of initiating MS treatment at this time.  Plan:  - continue plavix and lipitor for  stroke prevention - continue follow up loop record - Follow up with your primary care physician for stroke risk factor modification. Recommend maintain blood pressure goal <130/80, diabetes with hemoglobin A1c goal below 7.0% and lipids with LDL cholesterol goal below 70 mg/dL.  - check BP at home, avoid low BP. Keep hydrated  - healthy diet and regular exercise - follow up in 3 months.  I spent more than 25 minutes of face to face time with the patient. Greater than 50% of time was spent in counseling and coordination of care. We discussed clinical trials, continued antiplatelet therapy, and follow up  with oncology in [place].   No orders of the defined types were placed in this encounter.   Meds ordered this encounter  Medications  . DISCONTD: amantadine (SYMMETREL) 100 MG capsule    Sig: TAKE 1 CAPSULE BY MOUTH TWICE DAILY WITH BREAKFAST AND LUNCH  . DISCONTD: rOPINIRole (REQUIP) 1 MG tablet    Sig: Take by mouth.  . DISCONTD: amLODipine (NORVASC) 10 MG tablet    Sig: Take by mouth.  . DISCONTD: ARIPiprazole (ABILIFY) 5 MG tablet    Sig: Take by mouth.  . bisoprolol (ZEBETA) 5 MG tablet    Sig: Take by mouth.  . DISCONTD: clopidogrel (PLAVIX) 75 MG tablet    Sig: Take by mouth.  . DISCONTD: furosemide (LASIX) 40 MG tablet    Sig: Take by mouth.  . DISCONTD: HYDROcodone-acetaminophen (NORCO) 10-325 MG tablet    Sig: Take by mouth.  . DISCONTD: omeprazole (PRILOSEC) 20 MG capsule    Sig: Take by mouth.  . DISCONTD: oxybutynin (DITROPAN-XL) 10 MG 24 hr tablet  . predniSONE (DELTASONE) 10 MG tablet    Sig: Take by mouth.  Marland Kitchen albuterol (PROVENTIL HFA;VENTOLIN HFA) 108 (90 Base) MCG/ACT inhaler    Sig: Inhale into the lungs.  Marland Kitchen DISCONTD: tiotropium (SPIRIVA) 18 MCG inhalation capsule    Sig: Place into inhaler and inhale.  . Cyanocobalamin (VITAMIN B-12 CR) 1000 MCG TBCR    Sig: Take by mouth.  . DISCONTD: losartan (COZAAR) 50 MG tablet    Patient Instructions  - continue  plavix and lipitor for stroke prevention - continue follow up loop record - Follow up with your primary care physician for stroke risk factor modification. Recommend maintain blood pressure goal <130/80, diabetes with hemoglobin A1c goal below 7.0% and lipids with LDL cholesterol goal below 70 mg/dL.  - check BP at home, avoid low BP. Keep hydrated  - healthy diet and regular exercise - follow up in 3 months.   Rosalin Hawking, MD PhD [facility] [address], [place], [place] [address] [phone]

## 2015-08-18 ENCOUNTER — Other Ambulatory Visit: Payer: Self-pay | Admitting: Family Medicine

## 2015-08-18 NOTE — Telephone Encounter (Signed)
Refill appropriate and filled per protocol. 

## 2015-08-19 ENCOUNTER — Telehealth: Payer: Self-pay | Admitting: Family Medicine

## 2015-08-19 NOTE — Telephone Encounter (Signed)
Requesting refill on Hydrocodone - Ok to refill??        

## 2015-08-21 MED ORDER — HYDROCODONE-ACETAMINOPHEN 10-325 MG PO TABS: 1.0000 | ORAL_TABLET | Freq: Four times a day (QID) | ORAL | 0 refills | Status: DC | PRN

## 2015-08-21 NOTE — Telephone Encounter (Signed)
ok 

## 2015-08-21 NOTE — Telephone Encounter (Signed)
RX printed, left up front and patient aware to pick up after 2 pm via vm 

## 2015-08-22 ENCOUNTER — Other Ambulatory Visit: Payer: Self-pay | Admitting: Family Medicine

## 2015-08-22 MED ORDER — HYDROCODONE-ACETAMINOPHEN 10-325 MG PO TABS: 1.0000 | ORAL_TABLET | Freq: Four times a day (QID) | ORAL | 0 refills | Status: DC | PRN

## 2015-08-22 NOTE — Telephone Encounter (Signed)
RX printed, left up front and patient aware to pick up  

## 2015-08-26 ENCOUNTER — Ambulatory Visit (INDEPENDENT_AMBULATORY_CARE_PROVIDER_SITE_OTHER): Payer: Medicare Other | Admitting: Internal Medicine

## 2015-08-26 ENCOUNTER — Telehealth: Payer: Self-pay | Admitting: Internal Medicine

## 2015-08-26 ENCOUNTER — Encounter: Payer: Self-pay | Admitting: Internal Medicine

## 2015-08-26 ENCOUNTER — Other Ambulatory Visit: Payer: Medicare Other

## 2015-08-26 VITALS — BP 124/62 | HR 76 | Ht 71.0 in | Wt 215.0 lb

## 2015-08-26 DIAGNOSIS — I712 Thoracic aortic aneurysm, without rupture, unspecified: Secondary | ICD-10-CM

## 2015-08-26 DIAGNOSIS — J449 Chronic obstructive pulmonary disease, unspecified: Secondary | ICD-10-CM

## 2015-08-26 DIAGNOSIS — I639 Cerebral infarction, unspecified: Secondary | ICD-10-CM

## 2015-08-26 DIAGNOSIS — R911 Solitary pulmonary nodule: Secondary | ICD-10-CM | POA: Diagnosis not present

## 2015-08-26 NOTE — Progress Notes (Signed)
Subjective:     Patient ID: [name] [name], male   DOB: 30-Mar-1940, 75 y.o.   MRN: [id number]  PCP [name], MD   HPI  OV 04/03/2015  Chief Complaint  Patient presents with  . Follow-up    Pt states his breathing is doing well. Pt denies SOB, cough, wheezing, CP/tightness.     Follow-up mild to moderate COPD. He has multiple medical problems. Last seen by my nurse practitioner several months ago. He also has right middle lobe nodule which she follows up 6 mm present since summer 2015. Most recent CT chest August 2016 show stability of the nodule. Currently he denies any symptoms. He says he only uses albuterol as needed. He does not use his Spiriva. He does not have any nocturnal symptoms. No limitations with activities of daily living. He has good functional status. No shortness of breath or wheezing or cough. He is only limited by his visceral obesity. Otherwise feels well. Up-to-date with flu shot and    OV 08/26/2015  Chief Complaint  Patient presents with  . Follow-up    Pt states his breathing is unchanged since last OV, SOB is at baseline. Pt c/o prod cough with white mucus. Pt denies CP/tightness and f/c/s.     Follow-up Gold stage II COPD based on 2015 PFTs showing FEV1 2.42 L/77% and ratio 72 with a DLCO of 21.06 and 66%.  - Overall he reports stable health. Last seen in spring 2017. In June 2017 he did have an echocardiogram that shows ejection fraction 65% and grade 1 diastolic dysfunction but otherwise fine. He reports stable health on Spiriva but looking at Score gets significant exertional dyspnea and fatigue. Walking desaturation test today in the office 185 feet 3 laps on room air: He did not desaturate below 94% got tachycardic to 103 and got very dyspneic. No chest pains no cough. No wheeze or hemoptysis. Denies any coronary artery disease history. Review of the chart shows in 2015 he had nonobstructive disease by cardiac catheterization. He was last seen by  cardiology 2017 June. His CT chest did show thoracic Ira aneurysm but I'm not so sure cardiology is aware of this was in the   Follow-up lung nodules: He had in June 2017 CT chest that shows stability of lung nodules for 2 years. He is eligible for lung cancer low-dose screening next year summer 2018  Off note: Lab review shows a creatinine bump from 1.32 mg percent but his baseline is fluctuate anywhere from 0.91.6 milligrams percent; all of this was in June 2017.  CAT COPD Symptom & Quality of Life Score (GSK trademark) 0 is no burden. 5 is highest burden 08/26/2015   Never Cough -> Cough all the time 3  No phlegm in chest -> Chest is full of phlegm 2  No chest tightness -> Chest feels very tight 0  No dyspnea for 1 flight stairs/hill -> Very dyspneic for 1 flight of stairs 5  No limitations for ADL at home -> Very limited with ADL at home 4  Confident leaving home -> Not at all confident leaving home 2  Sleep soundly -> Do not sleep soundly because of lung condition 0  Lots of Energy -> No energy at all 5  TOTAL Score (max 40)  21       IMPRESSION: 1. The nodules are stable from 06/26/2013. More spiculated nodules inferomedially in the right middle lobe were not previously positive on PET scan and accordingly are likely benign.  The 5 by 6 mm nodule is also stable over the past 2 years. This 2 year stability is compatible with benign process, and based on current guidelines, further follow up surveillance is not required. 2. 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 Recommend annual imaging followup by CTA or MRA. This recommendation follows 2010 ACCF/AHA/AATS/ACR/ASA/SCA/SCAI/SIR/STS/SVM Guidelines for the Diagnosis and Management of Patients with Thoracic Aortic Disease. Circulation. 2010; 121: [id number] 3. Coronary, aortic arch, and branch vessel atherosclerotic vascular disease.   Electronically Signed   By: Van Clines M.D.   On: 06/05/2015  17:00      has a past medical history of Arthritis; Bladder cancer ([place]); BPH (benign prostatic hyperplasia); Brugada syndrome; CAD (coronary artery disease); Chronic diastolic CHF (congestive heart failure) (HCC); CKD (chronic kidney disease); COPD (chronic obstructive pulmonary disease) ([place]); Depression; DOE (dyspnea on exertion); GERD (gastroesophageal reflux disease); History of Doppler ultrasound; HLD (hyperlipidemia); HTN (hypertension); Low testosterone; Lung nodule; LVH (left ventricular hypertrophy); Mild dementia; Mitral regurgitation; MS (multiple sclerosis) ([place]); Right bundle branch block; Stroke ([place]); Thoracic aortic aneurysm ([place]); and Urinary retention with incomplete bladder emptying.   reports that he quit smoking about 22 years ago. His smoking use included Cigarettes. He has a 80.00 pack-year smoking history. He has quit using smokeless tobacco. His smokeless tobacco use included Chew.  Past Surgical History:  Procedure Laterality Date  . APPENDECTOMY    . EP IMPLANTABLE DEVICE N/A 06/23/2015   Procedure: Loop Recorder Insertion;  Surgeon: Deboraha Sprang, MD;  Location: [facility] CV LAB;  Service: Cardiovascular;  Laterality: N/A;  . HEMORRHOID SURGERY    . LEFT HEART CATHETERIZATION WITH CORONARY ANGIOGRAM N/A 01/05/2013   Procedure: LEFT HEART CATHETERIZATION WITH CORONARY ANGIOGRAM;  Surgeon: Larey Dresser, MD;  Location[facility] CATH LAB;  Service: Cardiovascular;  Laterality: N/A;  . ROTATOR CUFF REPAIR    . TEE WITHOUT CARDIOVERSION N/A 06/23/2015   Procedure: TRANSESOPHAGEAL ECHOCARDIOGRAM (TEE);  Surgeon: Fay Records, MD;  Location: [facility] ENDOSCOPY;  Service: Cardiovascular;  Laterality: N/A;  . TONSILLECTOMY    . TOTAL KNEE ARTHROPLASTY Right   . TRANSURETHRAL RESECTION OF PROSTATE      Allergies  Allergen Reactions  . Acetylcholine     unknown  . Alcohol-Sulfur [Sulfur]     unknown  . Amitriptyline     unknown  . Bupivacaine     unknown  . Clomipramine Hcl      unknown  . Cocaine     unknown  . Desipramine     unknown  . Ergonovine     unknown  . Flecainide     unknown  . Lithium     unknown  . Loxapine     unknown  . Nortriptyline     unknown  . Oxcarbazepine     unknown  . Procainamide     unknown  . Procaine     unknwon  . Propafenone     unknown  . Propofol     unknown  . Trifluoperazine     unknown    Immunization History  Administered Date(s) Administered  . Influenza Whole 11/07/2006, 09/30/2008  . Influenza, High Dose Seasonal PF 10/17/2012  . Influenza,inj,Quad PF,36+ Mos 09/04/2013, 11/18/2014  . Pneumococcal Conjugate-13 08/06/2014  . Pneumococcal Polysaccharide-23 11/07/2006  . Td 11/07/2006  . Tdap 07/04/2013  . Zoster 11/20/2009    Family History  Problem Relation Age of Onset  . Stroke Father   . Stroke Mother   . Hypertension Sister   .  Kidney cancer Brother   . Arthritis Brother   . Other Brother     knee problems, Bil TKR  . Hypertension      family history     Current Outpatient Prescriptions:  .  acetaminophen (TYLENOL) 325 MG tablet, Take 650 mg by mouth every 6 (six) hours as needed., Disp: , Rfl:  .  amantadine (SYMMETREL) 100 MG capsule, TAKE 1 CAPSULE BY MOUTH TWICE DAILY WITH BREAKFAST AND LUNCH, Disp: 180 capsule, Rfl: 0 .  amLODipine (NORVASC) 10 MG tablet, Take 1 tablet (10 mg total) by mouth daily., Disp: 30 tablet, Rfl: 3 .  ARIPiprazole (ABILIFY) 5 MG tablet, TAKE 1 TABLET DAILY, Disp: 90 tablet, Rfl: 2 .  atorvastatin (LIPITOR) 20 MG tablet, TAKE 1 TABLET DAILY, Disp: 90 tablet, Rfl: 3 .  bisoprolol (ZEBETA) 5 MG tablet, TAKE ONE-HALF (1/2) TABLET DAILY, Disp: 45 tablet, Rfl: 0 .  cholecalciferol (VITAMIN D) 400 units TABS tablet, Take 400 Units by mouth., Disp: , Rfl:  .  clopidogrel (PLAVIX) 75 MG tablet, Take 1 tablet (75 mg total) by mouth daily., Disp: 90 tablet, Rfl: 2 .  Cyanocobalamin (VITAMIN B-12 CR) 1000 MCG TBCR, Take by mouth., Disp: , Rfl:  .  furosemide  (LASIX) 40 MG tablet, Take 1 tablet (40 mg total) by mouth daily as needed (swelling). Please keep upcoming appointment for further refills (Patient taking differently: Take 40 mg by mouth 2 (two) times daily. Please keep upcoming appointment for further refills), Disp: 180 tablet, Rfl: 0 .  HYDROcodone-acetaminophen (NORCO) 10-325 MG tablet, Take 1 tablet by mouth every 6 (six) hours as needed for moderate pain or severe pain., Disp: 100 tablet, Rfl: 0 .  omeprazole (PRILOSEC) 20 MG capsule, Take 1 capsule (20 mg total) by mouth daily., Disp: 90 capsule, Rfl: 3 .  oxybutynin (DITROPAN-XL) 5 MG 24 hr tablet, Take 5 mg by mouth at bedtime., Disp: , Rfl:  .  PROAIR HFA 108 (90 BASE) MCG/ACT inhaler, USE 1 TO 2 INHALATIONS EVERY 6 HOURS AS NEEDED FOR WHEEZING OR SHORTNESS OF BREATH, Disp: 25.5 g, Rfl: 4 .  rOPINIRole (REQUIP) 1 MG tablet, Take 1 mg by mouth at bedtime. , Disp: , Rfl:  .  SPIRIVA HANDIHALER 18 MCG inhalation capsule, INHALE THE CONTENTS OF 1 CAPSULE WITH 2         INHALATIONS ONCE DAILY AS DIRECTED, Disp: 1 capsule, Rfl: 11 .  venlafaxine XR (EFFEXOR-XR) 150 MG 24 hr capsule, Take 150 mg by mouth 2 (two) times daily. , Disp: , Rfl:     Review of Systems     Objective:   Physical Exam  Constitutional: He is oriented to person, place, and time. He appears well-developed and well-nourished. No distress.  HENT:  Head: Normocephalic and atraumatic.  Right Ear: External ear normal.  Left Ear: External ear normal.  Mouth/Throat: Oropharynx is clear and moist. No oropharyngeal exudate.  Eyes: Conjunctivae and EOM are normal. Pupils are equal, round, and reactive to light. Right eye exhibits no discharge. Left eye exhibits no discharge. No scleral icterus.  Neck: Normal range of motion. Neck supple. No JVD present. No tracheal deviation present. No thyromegaly present.  Cardiovascular: Normal rate, regular rhythm and intact distal pulses.  Exam reveals no gallop and no friction rub.   No  murmur heard. Pulmonary/Chest: Effort normal and breath sounds normal. No respiratory distress. He has no wheezes. He has no rales. He exhibits no tenderness.  Abdominal: Soft. Bowel sounds are normal. He exhibits  no distension and no mass. There is no tenderness. There is no rebound and no guarding.  Visceral obesity present  Musculoskeletal: Normal range of motion. He exhibits no edema or tenderness.  Bilateral knee scar present  Lymphadenopathy:    He has no cervical adenopathy.  Neurological: He is alert and oriented to person, place, and time. He has normal reflexes. No cranial nerve deficit. Coordination normal.  Skin: Skin is warm and dry. No rash noted. He is not diaphoretic. No erythema. No pallor.  Psychiatric: He has a normal mood and affect. His behavior is normal. Judgment and thought content normal.  Nursing note and vitals reviewed.   Vitals:   08/26/15 1434  BP: 124/62  Pulse: 76  SpO2: 95%  Weight: 215 lb (97.5 kg)  Height: 5\' 11"  (1.803 m)        Assessment:       ICD-9-CM ICD-10-CM   1. COPD, moderate ([place]) 496 J44.9 AMB referral to pulmonary rehabilitation     Alpha-1 antitrypsin phenotype  2. Lung nodule 793.11 R91.1    I suspect a lot of dyspnea and fatigue is due to physical deconditioning we'll refer him to pulmonary rehhabitation. If this does not work then he will need a pulmonary stress test or nuclear medicine cardiac stress test. Is also eligible for lung cancer screening in a year from now. Rest as below. He is interested in pulmonary rehabilitation.    Plan:      Lung nodule  - stable in 2 years  - will evaluate for lung cancer screening in summer 2018  COPD - stable  - continue albuterol as needed  - continue spiriva  - check alpha 1 genetic test for copd (do not see evidence we checked last time)  - refer pulmonary rehab  - flu shot in fall  Kidney injury   - noticed new rise v fluctuation within range in creatinine in June 2017 -  please talk to [place], MD   Followup  -4-6 months or sooner if needed

## 2015-08-26 NOTE — Patient Instructions (Addendum)
ICD-9-CM ICD-10-CM   1. COPD, moderate ([place]) 496 J44.9   2. Lung nodule 793.11 R91.1     Lung nodule  - stable in 2 years  - will evaluate for lung cancer screening in summer 2018  COPD - stable  - continue albuterol as needed  - continue spiriva  - check alpha 1 genetic test for copd (do not see evidence we checked last time)  - refer pulmonary rehab  - flu shot in fall  Kidney injury   - noticed new rise v fluctuation within range in creatinine in June 2017 - please talk to Hector, MD   Followup  -4-6 months or sooner if needed

## 2015-08-26 NOTE — Telephone Encounter (Signed)
TO cards: please malke note of 4.1 Tho Aorta aneurusm on ct June 2017  To pcp [facility] TOM, MD - > please make note of higher end creatinine late June 2017  Dr. Brand Males, M.D., F.C.C.P Pulmonary and Critical Care Medicine Staff Physician [place] [facility] Pager: [phone], If no answer or between  15:00h - 7:00h: call [phone]  08/26/2015 3:08 PM

## 2015-08-27 ENCOUNTER — Other Ambulatory Visit: Payer: Self-pay

## 2015-08-27 MED ORDER — FUROSEMIDE 40 MG PO TABS: 40.0000 mg | ORAL_TABLET | Freq: Two times a day (BID) | ORAL | 1 refills | Status: DC

## 2015-08-27 NOTE — Telephone Encounter (Signed)
Yes, that is a good plan.

## 2015-08-27 NOTE — Telephone Encounter (Signed)
Dalton Can you take a look at his CT? I reviewed his chart. He had a CT in 2015 with asc Aorta 4.2 cm This CT is 4.1 cm. He just had a TEE in the hospital in 6/17 for recent CVA. Should we just do a chest MRA in 1 year? Scott

## 2015-08-28 NOTE — Telephone Encounter (Signed)
Please arrange Cardiac MRI and Chest MRA in 1 year to evaluate ascending thoracic aortic aneurysm. Richardson Dopp, PA-C   08/28/2015 7:21 AM

## 2015-08-28 NOTE — Telephone Encounter (Signed)
Renal fcn is up in June.  Recheck BMP.  If still elevated, my need to back down on lasix.

## 2015-08-29 ENCOUNTER — Other Ambulatory Visit: Payer: Self-pay | Admitting: Family Medicine

## 2015-08-29 DIAGNOSIS — R799 Abnormal finding of blood chemistry, unspecified: Secondary | ICD-10-CM

## 2015-08-29 NOTE — Telephone Encounter (Signed)
DPR for pt's wife who I s/w in regards to recommendation from Dr. Aundra Dubin and Brynda Rim. PA to have a Chest MRA and Cardiac MRI to be done in 1 yr. these have both been ordered.

## 2015-08-29 NOTE — Telephone Encounter (Signed)
Called and LMOVM for pt to come in to have BMP rechecked

## 2015-09-01 ENCOUNTER — Telehealth: Payer: Self-pay | Admitting: Internal Medicine

## 2015-09-01 DIAGNOSIS — J438 Other emphysema: Secondary | ICD-10-CM

## 2015-09-01 NOTE — Telephone Encounter (Signed)
Called and spoke to Huson with pulm rehab. Portia states the PFT does not qualify pt to go to pulm rehab under the COPD diagnosis, however emphysema can be the diagnosis.   MR please advise if ok to associate pulm rehab order with emphysema, this was not in pt's problem list.

## 2015-09-02 NOTE — Telephone Encounter (Signed)
pls change dx to emphysema  Thanks  Dr. Brand Males, M.D., [facility].C.P Pulmonary and Critical Care Medicine Staff Physician [place] [facility] Pager: [phone], If no answer or between  15:00h - 7:00h: call [phone]  09/02/2015 9:19 AM

## 2015-09-02 NOTE — Telephone Encounter (Signed)
Spoke with pulmonary rehab and advised to change dx to Emphysema per Dr Chase Caller.  New order placed with new dx.

## 2015-09-03 LAB — ALPHA-1 ANTITRYPSIN PHENOTYPE: A-1 Antitrypsin: 180 mg/dL (ref 83–199)

## 2015-09-03 NOTE — Progress Notes (Signed)
lmtcb for pt.  

## 2015-09-04 ENCOUNTER — Encounter: Payer: Self-pay | Admitting: Family Medicine

## 2015-09-04 ENCOUNTER — Ambulatory Visit (INDEPENDENT_AMBULATORY_CARE_PROVIDER_SITE_OTHER): Payer: Medicare Other | Admitting: Family Medicine

## 2015-09-04 VITALS — BP 132/70 | HR 72 | Temp 98.2°F | Resp 20 | Ht 71.0 in | Wt 218.0 lb

## 2015-09-04 DIAGNOSIS — R7303 Prediabetes: Secondary | ICD-10-CM

## 2015-09-04 DIAGNOSIS — R5383 Other fatigue: Secondary | ICD-10-CM | POA: Diagnosis not present

## 2015-09-04 DIAGNOSIS — I639 Cerebral infarction, unspecified: Secondary | ICD-10-CM

## 2015-09-04 DIAGNOSIS — N289 Disorder of kidney and ureter, unspecified: Secondary | ICD-10-CM | POA: Diagnosis not present

## 2015-09-04 NOTE — Progress Notes (Signed)
Subjective:    Patient ID: [name] [name], male    DOB: 08-02-1940, 75 y.o.   MRN: [id number]  HPI Recently admitted to hospital.  I have copied relevant portions of the DC summary below for reference: Admit date: 06/20/2015 Discharge date: 06/23/2015  Time spent: >54mins  Recommendations for Outpatient Follow-up:  1. F/u with PMD within a week for hospital discharge follow up, repeat cbc/bmp at follow up, patient is advised to check blood pressure at home and bring record to hospital discharge appointment 2. F/u with neurology Dr Lottie Rater and [facility] cardiology and [place] urology as already scheduled.  Discharge Diagnoses:  Active Hospital Problems   Diagnosis Date Noted  . Syncope and collapse   . Dysarthria   . Acute CVA (cerebrovascular accident) ([place])   . Acute cystitis without hematuria   . Stroke ([place])   . Slurred speech 06/20/2015  . History of CVA (cerebrovascular accident) 06/20/2015  . AKI (acute kidney injury) ([place]) 06/20/2015  . Gastroesophageal reflux disease without esophagitis 06/05/2015  . Hyperlipidemia 06/05/2015  . COPD (chronic obstructive pulmonary disease) ([place]) 09/04/2013  . Chronic diastolic CHF (congestive heart failure) ([place]) 01/03/2013  . Depression   . Essential hypertension 04/08/2009  . Multiple sclerosis ([place]) 10/11/2006    Resolved Hospital Problems   Diagnosis Date Noted Date Resolved  No resolved problems to display.    Discharge Condition: stable  Diet recommendation: heart healthy  Filed Weights   06/20/15 0946  Weight: 99.791 kg (220 lb)    History of present illness:  Chief Complaint: slurred speech, weakness  HPI: [name] [name] is a 75 y.o. male with medical history significant for CAD, chronic diastolic CHF, COPD . Patient was hospitalized a few days ago with an acute right MCA stroke. He was discharged home with plans for outpatient PT, outpatient TEE  and possibly Holter monitor. Patient did well for the two days he was home. This morning he took dog for a walk. A neighbor called the wife to report that she had found patient sitting on the ground in the neighborhood. Patient states he tried to walk too far, became fatigued and had to sit down. When patient's wife arrived she found him with worsening slurred speech and worsening dysarthria. Patient saw PCP this a.m., he was transported by EMS from there to the ED. Since receiving a liter of fluid his neurologic symptoms have already improved though he is not back to baseline per wife.   ED Course:  Afebrile, hemodynamically stable. Normal oxygen saturation. WBC 11.1, Cr 28, 1.64 No acute changes on head CT Urinalysis: Many bacteria, positive nitrites, wbc too numerous to count EKG Sinus rhythm IVCD, consider atypical RBBB Borderline ST elevation, anterior leads  Hospital Course:  Active Problems:  Multiple sclerosis (HCC)  Essential hypertension  Depression  Chronic diastolic CHF (congestive heart failure) (HCC)  COPD (chronic obstructive pulmonary disease) (HCC)  Hyperlipidemia  Gastroesophageal reflux disease without esophagitis  Slurred speech  History of CVA (cerebrovascular accident)  AKI (acute kidney injury) ([place])  Acute cystitis without hematuria  Stroke [facility])  Dysarthria  Acute CVA (cerebrovascular accident) ([place])  Syncope and collapse   Stroke like symptoms. He is s/p recent right MCA stroke (embolic?) with residual slurred speech at baseline. with worsening slurred speech / fatigue / dysarthria possibly secondary to dehydration and / or UTI vrs progression of stroke. Slurred speech improved after liter of fluid though not back to baseline -MRI brain w and wo contrast on admission showed  progression of ischemia in the superior right insula -no weakness in extremities, no need of PT/OT, speech has improved, passed bedside swallow eval -neurology  consulted, input appreciated, TEE/loop recorder on Monday per neuro recommendation -per neuro can discharge patient on plavix, continue statin, no need of asa  Dehydation / UTI / AKI. Baseline creatinine 1.06, up to 1.64 on admission, likely secondary to volume depletion and uti -Ivf, abx, hold lasix until renal function improves -hold ditropan - urine culture + klebsiella, sensitive to rocephin -discharged on keflex for additional 3days to finish total of 7day abx treatment for complicated uti.  Multiple sclerosis ([place]). No flares in a few years per wife. Not on treatment. -Continue Requip for restless legs  Essential hypertension,  continue beta blocker, norvasc restarted at discharge, avoid hypotension, bp goal normal tensive, pmd to continue monitor blood pressure control   Chronic diastolic CHF (congestive heart failure). Grade I diastolic dysfunction. LVEF 60-65% on echo 3 days ago . No evidence for volume overload.  -Needs IVF for dehydration in the hospital -betablocker and norvasc held in the hospital for permissive hypertension in the setting of acute stroke --continue beta blocker, norvasc restarted at discharge, avoid hypotension, bp goal normal tensive, pmd to continue monitor blood pressure control  COPD (chronic obstructive pulmonary disease) ([place]). Stable.  -continue home proventil and spriva Lung clear   Hyperlipidemia -continue home statin  Gastroesophageal reflux disease  -continue home PPI  Dehydration with acute kidney injury, dry mucous membranes. . -Received fluids bolus and maintenance fluids -Hold home Lasix ( he is on prn lasix at home) -cr normalized  COPD, stable. . -continue home Proventilair, [place]. . Stable  -Continue home effexor, abilify  H/o bladder cancer and bladder spasm: close followed with [facility]rology at [facility]. Patient report he does self cath about one a month.   Code Status: DNR Family  Communication: patient and his wife in room Disposition Plan: Discharge home after tee/loop record placement on 6/19  Consults called: neurology/cardiology   Procedures: tee/loop record placement on 6/19     He is here for follow up.  He has been taking aspirin 325 mg a day and Plavix since discharge. I explained to both the patient and the wife that he only needs to be on Plavix. Otherwise he has been doing well since hospital discharge. Based on his baseline, his thought processes do seem to be somewhat delayed. He does seem to have some mild memory impairment since the stroke. For instance yesterday he was pumping gas in the car and forgot to remove the handle from the gas tank. When he pulled away from the pump, he ripped the handle off. His wife is driving. He is no longer driving. However this is not the patient's baseline. He still has 3 days on his antibiotics. He denies any fevers or chills. His blood pressure at home has been between 130 and 140. Diastolic blood pressures have been excellent. I am satisfied with these blood pressures I want to avoid hypotension given his recent CVA. Otherwise he is doing well without concerns.  At that time, my plan was: Overall, the patient looks much better than he did prior to his recent hospitalization. I want him to monitor his blood pressure 2-3 times a day and recheck with me next week. Ideally I would like his blood pressure to be less than [id number] systolic. I would like him to return next week for urine sample off antibiotics to ensure resolution of his urinary tract  infection. I emphasized to the patient he needs to stop aspirin and only take Plavix. We will recheck a fasting lipid panel and hemoglobin A1c in 3 months. He will follow-up with his neurologist in August.  07/15/15 He is here today for follow-up. His blood pressure at home has been averaging between [id number] and 0000000 systolic over [id number] diastolic. Otherwise he is doing well with no  concerns.  AT that time, my plan was: Increase bisoprolol to 5 mg daily and recheck blood pressure in 2 weeks.  09/04/15 Dr. Chase Caller recently center staff message noting that the patient's creatinine had risen to 1.3. His baseline is around 1. Is having concern that the patient was dehydrated. Therefore I asked him to come in for evaluation. I had asked the patient to temporarily discontinue his Lasix and try to drink more fluid. I have asked him for a urine sample today but he is unable to provide a urine sample. He has been off his Lasix. Therefore his legs are swelling. He has +1 edema in both legs to the knee. He denies any shortness of breath at rest. He has stable chronic dyspnea on exertion likely due to deconditioning as well as his COPD. Although he is here today mainly to recheck his renal function, he seems very lethargic and very sleepy. He honestly appears overmedicated. He is slow to respond to questions. There are no neurologic deficits on his exam. He is not taking any pain medication in the last 48 hours. He denies any other substance use. He is not taking Benadryl. However he is on many centrally acting medications including Abilify as well as ditropan.   Past Medical History:  Diagnosis Date  . Arthritis    s/p TKR  . Bladder cancer (HCC)    [facility], [facility] low grade papillary urothileal carcinoma  . BPH (benign prostatic hyperplasia)   . Brugada syndrome    Possible Type II Brugada ECG pattern. No family history of SCD, no syncope, no tachypalpitations.  Marland Kitchen CAD (coronary artery disease)    a. LHC 1/15 - mid LCx 50 and 60%, proximal RCA 50%  . Chronic diastolic CHF (congestive heart failure) ([place])   . CKD (chronic kidney disease)   . COPD (chronic obstructive pulmonary disease) ([place])    Prior heavy smoker. PFTs (3/11): FVC 87%, FEV1 73%, ratio 0.57, DLCO 75%, TLC 121%. Moderate obstructive defect. PFTs (9/15): Only minimal obstruction, sugPrior heavy smoker. PFTs (3/11): FVC 87%, FEV1  73%, ratio 0.57, DLCO 75%, TLC 121%. Moderate obstructive defect. PFTs (9/15) minimal obstruction, poss asthma component   . Depression    with bipolar tendencies  . DOE (dyspnea on exertion)    a. Myoview 8/09- EF 57%, no ischemia // b. Myoview (3/11) EF 68%, diaphragmatic attenuation, no ischemia //  c Echo (4/11) EF 0000000, mild diastolic dysfunction, PASP 38 mmHg // d. PFTs 9/15 minimal obstruction  /  e. CT negative for ILD  . GERD (gastroesophageal reflux disease)   . History of Doppler ultrasound    Carotid US 3/11 negative for significant stenosis  . HLD (hyperlipidemia)   . HTN (hypertension)   . Low testosterone   . Lung nodule    a. CT in 2015 >> PET in 12/15 not sugg of malignancy   . LVH (left ventricular hypertrophy)    a. Echo 12/14 Inferior and distal septal HK, mild LVH, EF 50-55%, mild MR, mild LAE  . Mild dementia   . Mitral regurgitation    Echo (4/11) with  PISA ERO 0.3 cm^2 and regurgitant volume 41 mL (moderate MR). Echo (12/14) with mild MR.   . MS (multiple sclerosis) ([place])   . Right bundle branch block   . Stroke ([place])   . Thoracic aortic aneurysm (HCC)    4.1 cm 2017  . Urinary retention with incomplete bladder emptying    receives botox injections and treatment for BPH through [facility]   Past Surgical History:  Procedure Laterality Date  . APPENDECTOMY    . EP IMPLANTABLE DEVICE N/A 06/23/2015   Procedure: Loop Recorder Insertion;  Surgeon: Deboraha Sprang, MD;  Location: [facility] CV LAB;  Service: Cardiovascular;  Laterality: N/A;  . HEMORRHOID SURGERY    . LEFT HEART CATHETERIZATION WITH CORONARY ANGIOGRAM N/A 01/05/2013   Procedure: LEFT HEART CATHETERIZATION WITH CORONARY ANGIOGRAM;  Surgeon: Larey Dresser, MD;  Location[facility] C[facility] LAB;  Service: Cardiovascular;  Laterality: N/A;  . ROTATOR CUFF REPAIR    . TEE WITHOUT CARDIOVERSION N/A 06/23/2015   Procedure: TRANSESOPHAGEAL ECHOCARDIOGRAM (TEE);  Surgeon: Fay Records, MD;  Location[facility] ENDOSCOPY;   Service: Cardiovascular;  Laterality: N/A;  . TONSILLECTOMY    . TOTAL KNEE ARTHROPLASTY Right   . TRANSURETHRAL RESECTION OF PROSTATE     Current Outpatient Prescriptions on File Prior to Visit  Medication Sig Dispense Refill  . acetaminophen (TYLENOL) 325 MG tablet Take 650 mg by mouth every 6 (six) hours as needed.    Marland Kitchen amantadine (SYMMETREL) 100 MG capsule TAKE 1 CAPSULE BY MOUTH TWICE DAILY WITH BREAKFAST AND LUNCH 180 capsule 0  . amLODipine (NORVASC) 10 MG tablet Take 1 tablet (10 mg total) by mouth daily. 30 tablet 3  . ARIPiprazole (ABILIFY) 5 MG tablet TAKE 1 TABLET DAILY 90 tablet 2  . atorvastatin (LIPITOR) 20 MG tablet TAKE 1 TABLET DAILY 90 tablet 3  . bisoprolol (ZEBETA) 5 MG tablet TAKE ONE-HALF (1/2) TABLET DAILY 45 tablet 0  . cholecalciferol (VITAMIN D) 400 units TABS tablet Take 400 Units by mouth.    . clopidogrel (PLAVIX) 75 MG tablet Take 1 tablet (75 mg total) by mouth daily. 90 tablet 2  . Cyanocobalamin (VITAMIN B-12 CR) 1000 MCG TBCR Take by mouth.    . furosemide (LASIX) 40 MG tablet Take 1 tablet (40 mg total) by mouth 2 (two) times daily. Please keep upcoming appointment for further refills 60 tablet 1  . HYDROcodone-acetaminophen (NORCO) 10-325 MG tablet Take 1 tablet by mouth every 6 (six) hours as needed for moderate pain or severe pain. 100 tablet 0  . omeprazole (PRILOSEC) 20 MG capsule Take 1 capsule (20 mg total) by mouth daily. 90 capsule 3  . oxybutynin (DITROPAN-XL) 5 MG 24 hr tablet Take 5 mg by mouth at bedtime.    Marland Kitchen PROAIR HFA 108 (90 BASE) MCG/ACT inhaler USE 1 TO 2 INHALATIONS EVERY 6 HOURS AS NEEDED FOR WHEEZING OR SHORTNESS OF BREATH 25.5 g 4  . rOPINIRole (REQUIP) 1 MG tablet Take 1 mg by mouth at bedtime.     Marland Kitchen SPIRIVA HANDIHALER 18 MCG inhalation capsule INHALE THE CONTENTS OF 1 CAPSULE WITH 2         INHALATIONS ONCE DAILY AS DIRECTED 1 capsule 11  . venlafaxine XR (EFFEXOR-XR) 150 MG 24 hr capsule Take 150 mg by mouth 2 (two) times daily.        No current facility-administered medications on file prior to visit.    Allergies  Allergen Reactions  . Acetylcholine     unknown  .  Alcohol-Sulfur [Sulfur]     unknown  . Amitriptyline     unknown  . Bupivacaine     unknown  . Clomipramine Hcl     unknown  . Cocaine     unknown  . Desipramine     unknown  . Ergonovine     unknown  . Flecainide     unknown  . Lithium     unknown  . Loxapine     unknown  . Nortriptyline     unknown  . Oxcarbazepine     unknown  . Procainamide     unknown  . Procaine     unknwon  . Propafenone     unknown  . Propofol     unknown  . Trifluoperazine     unknown   Social History   Social History  . Marital status: Married    Spouse name: N/A  . Number of children: N/A  . Years of education: N/A   Occupational History  . retired Retired   Social History Main Topics  . Smoking status: Former Smoker    Packs/day: 2.00    Years: 40.00    Types: Cigarettes    Quit date: 01/04/1993  . Smokeless tobacco: Former Systems developer    Types: Chew  . Alcohol use No     Comment: 6 pack/day - 06/20/15 Pt states wuit drinking in the 1970's  . Drug use: No  . Sexual activity: Not on file   Other Topics Concern  . Not on file   Social History Narrative   Retired from the [employer] after 20 years   [place] Veteran      Review of Systems  All other systems reviewed and are negative.      Objective:   Physical Exam  Constitutional: He is oriented to person, place, and time.  Cardiovascular: Normal rate, regular rhythm and normal heart sounds.   Pulmonary/Chest: Effort normal and breath sounds normal. No respiratory distress. He has no wheezes. He has no rales.  Abdominal: Soft. Bowel sounds are normal.  Neurological: He is alert and oriented to person, place, and time. He has normal reflexes. No cranial nerve deficit. He exhibits normal muscle tone. Coordination normal.  Psychiatric: He has a normal mood and affect. His speech  is normal. He is slowed.  Vitals reviewed.         Assessment & Plan:  Prediabetes - Plan: Microalbumin, urine, BASIC METABOLIC PANEL WITH GFR  Renal insufficiency  Lethargic Patient is a prediabetic. I will check a urine microalbumin whenever he can give [place] urine sample. His blood pressures at home are borderline. The majority are between [id number] and [id number] systolic. However I'll recheck the patient's renal function today. I believe the increase in his creatinine was likely due to dehydration, his Lasix pill, and the fact that his wife has been giving him Celebrex on a daily basis for joint pain. We will recheck his renal function today and if his renal function continues to be elevated, we will need to have the patient avoid NSAIDs. I'm more concerned by his lethargy. I believe he is overmedicated. I recommended discontinuing oxybutynin. Reduce Abilify to 2.5 mg a day. Recheck in one week. Consider a sleep study for obstructive sleep apnea if he continues to be hypersomnolent.

## 2015-09-04 NOTE — Progress Notes (Signed)
Pt's wife received call. Informed her of the results per MR. Pt's wife verbalized understanding and denied any further questions or concerns at this time.

## 2015-09-05 ENCOUNTER — Other Ambulatory Visit: Payer: Self-pay | Admitting: *Deleted

## 2015-09-05 DIAGNOSIS — N289 Disorder of kidney and ureter, unspecified: Secondary | ICD-10-CM

## 2015-09-05 LAB — BASIC METABOLIC PANEL WITH GFR
BUN: 20 mg/dL (ref 7–25)
CO2: 24 mmol/L (ref 20–31)
Calcium: 9.7 mg/dL (ref 8.6–10.3)
Chloride: 99 mmol/L (ref 98–110)
Creat: 1.43 mg/dL — ABNORMAL HIGH (ref 0.70–1.18)
GFR, Est African American: 56 mL/min — ABNORMAL LOW (ref >=60)
GFR, Est Non African American: 48 mL/min — ABNORMAL LOW (ref >=60)
Glucose, Bld: 71 mg/dL (ref 70–99)
Potassium: 5 mmol/L (ref 3.5–5.3)
Sodium: 135 mmol/L (ref 135–146)

## 2015-09-05 NOTE — Telephone Encounter (Signed)
This med was called in on 06/05/2015

## 2015-09-11 ENCOUNTER — Telehealth: Payer: Self-pay | Admitting: *Deleted

## 2015-09-11 ENCOUNTER — Encounter: Payer: Self-pay | Admitting: Family Medicine

## 2015-09-11 ENCOUNTER — Ambulatory Visit (INDEPENDENT_AMBULATORY_CARE_PROVIDER_SITE_OTHER): Payer: Medicare Other | Admitting: Family Medicine

## 2015-09-11 VITALS — BP 118/78 | HR 74 | Temp 97.8°F | Resp 18 | Ht 71.0 in | Wt 216.0 lb

## 2015-09-11 DIAGNOSIS — I639 Cerebral infarction, unspecified: Secondary | ICD-10-CM

## 2015-09-11 DIAGNOSIS — N183 Chronic kidney disease, stage 3 unspecified: Secondary | ICD-10-CM

## 2015-09-11 LAB — BASIC METABOLIC PANEL WITH GFR
BUN: 18 mg/dL (ref 7–25)
CALCIUM: 9.6 mg/dL (ref 8.6–10.3)
CO2: 22 mmol/L (ref 20–31)
Chloride: 101 mmol/L (ref 98–110)
Creat: 0.9 mg/dL (ref 0.70–1.18)
GFR, EST NON AFRICAN AMERICAN: 84 mL/min (ref >=60)
GLUCOSE: 114 mg/dL — AB (ref 70–99)
Potassium: 4.8 mmol/L (ref 3.5–5.3)
Sodium: 136 mmol/L (ref 135–146)

## 2015-09-11 NOTE — Progress Notes (Signed)
Subjective:    Patient ID: [name] [name], male    DOB: 1940/06/29, 75 y.o.   MRN: [id number]  HPI  09/04/15 Dr. Chase Caller recently center staff message noting that the patient's creatinine had risen to 1.3. His baseline is around 1. Is having concern that the patient was dehydrated. Therefore I asked him to come in for evaluation. I had asked the patient to temporarily discontinue his Lasix and try to drink more fluid. I have asked him for a urine sample today but he is unable to provide a urine sample. He has been off his Lasix. Therefore his legs are swelling. He has +1 edema in both legs to the knee. He denies any shortness of breath at rest. He has stable chronic dyspnea on exertion likely due to deconditioning as well as his COPD. Although he is here today mainly to recheck his renal function, he seems very lethargic and very sleepy. He honestly appears overmedicated. He is slow to respond to questions. There are no neurologic deficits on his exam. He is not taking any pain medication in the last 48 hours. He denies any other substance use. He is not taking Benadryl. However he is on many centrally acting medications including Abilify as well as ditropan.  At that time, my plan was: Patient is a prediabetic. I will check a urine microalbumin whenever he can give [place] urine sample. His blood pressures at home are borderline. The majority are between [id number] and [id number] systolic. However I'll recheck the patient's renal function today. I believe the increase in his creatinine was likely due to dehydration, his Lasix pill, and the fact that his wife has been giving him Celebrex on a daily basis for joint pain. We will recheck his renal function today and if his renal function continues to be elevated, we will need to have the patient avoid NSAIDs. I'm more concerned by his lethargy. I believe he is overmedicated. I recommended discontinuing oxybutynin. Reduce Abilify to 2.5 mg a day. Recheck in one week.  Consider a sleep study for obstructive sleep apnea if he continues to be hypersomnolent.  09/11/15 When I repeated the patient's renal function, his creatinine had risen to 1.43. Therefore since early June when his creatinine was 0.9 and his glomerular filtration rate was greater than 60, the patient's glomerular filtration rate had dropped to 43 mL of blood per minute with a creatinine of 1.43 by the end of August. However this is much as up with the initiation of Celebrex for his polyarthralgias. Furthermore the patient was also taking naproxen at home on his own in addition to his Lasix. Over the last week I have had the patient avoid all NSAIDs and also continue to hold his Lasix and push fluids. He is here today for recheck of his renal function. He has a renal ultrasound scheduled for next week. Of note he seems less lethargic and more alert now that he is no longer taking the oxybutynin and on the reduced dose of Abilify   Past Medical History:  Diagnosis Date  . Arthritis    s/p TKR  . Bladder cancer (HCC)    [facility] low grade papillary urothileal carcinoma  . BPH (benign prostatic hyperplasia)   . Brugada syndrome    Possible Type II Brugada ECG pattern. No family history of SCD, no syncope, no tachypalpitations.  Marland Kitchen CAD (coronary artery disease)    a. LHC 1/15 - mid LCx 50 and 60%, proximal RCA 50%  . Chronic  diastolic CHF (congestive heart failure) ([place])   . CKD (chronic kidney disease)   . COPD (chronic obstructive pulmonary disease) ([place])    Prior heavy smoker. PFTs (3/11): FVC 87%, FEV1 73%, ratio 0.57, DLCO 75%, TLC 121%. Moderate obstructive defect. PFTs (9/15): Only minimal obstruction, sugPrior heavy smoker. PFTs (3/11): FVC 87%, FEV1 73%, ratio 0.57, DLCO 75%, TLC 121%. Moderate obstructive defect. PFTs (9/15) minimal obstruction, poss asthma component   . Depression    with bipolar tendencies  . DOE (dyspnea on exertion)    a. Myoview 8/09- EF 57%, no ischemia // b. Myoview  (3/11) EF 68%, diaphragmatic attenuation, no ischemia //  c Echo (4/11) EF 0000000, mild diastolic dysfunction, PASP 38 mmHg // d. PFTs 9/15 minimal obstruction  /  e. CT negative for ILD  . GERD (gastroesophageal reflux disease)   . History of Doppler ultrasound    Carotid US 3/11 negative for significant stenosis  . HLD (hyperlipidemia)   . HTN (hypertension)   . Low testosterone   . Lung nodule    a. CT in 2015 >> PET in 12/15 not sugg of malignancy   . LVH (left ventricular hypertrophy)    a. Echo 12/14 Inferior and distal septal HK, mild LVH, EF 50-55%, mild MR, mild LAE  . Mild dementia   . Mitral regurgitation    Echo (4/11) with PISA ERO 0.3 cm^2 and regurgitant volume 41 mL (moderate MR). Echo (12/14) with mild MR.   . MS (multiple sclerosis) ([place])   . Right bundle branch block   . Stroke ([place])   . Thoracic aortic aneurysm (HCC)    4.1 cm 2017  . Urinary retention with incomplete bladder emptying    receives botox injections and treatment for BPH through [facility]   Past Surgical History:  Procedure Laterality Date  . APPENDECTOMY    . EP IMPLANTABLE DEVICE N/A 06/23/2015   Procedure: Loop Recorder Insertion;  Surgeon: Deboraha Sprang, MD;  Location: [place] CV LAB;  Service: Cardiovascular;  Laterality: N/A;  . HEMORRHOID SURGERY    . LEFT HEART CATHETERIZATION WITH CORONARY ANGIOGRAM N/A 01/05/2013   Procedure: LEFT HEART CATHETERIZATION WITH CORONARY ANGIOGRAM;  Surgeon: Larey Dresser, MD;  Location: [facility] CATH LAB;  Service: Cardiovascular;  Laterality: N/A;  . ROTATOR CUFF REPAIR    . TEE WITHOUT CARDIOVERSION N/A 06/23/2015   Procedure: TRANSESOPHAGEAL ECHOCARDIOGRAM (TEE);  Surgeon: Fay Records, MD;  Location: [facility] ENDOSCOPY;  Service: Cardiovascular;  Laterality: N/A;  . TONSILLECTOMY    . TOTAL KNEE ARTHROPLASTY Right   . TRANSURETHRAL RESECTION OF PROSTATE     Current Outpatient Prescriptions on File Prior to Visit  Medication Sig Dispense Refill  . acetaminophen  (TYLENOL) 325 MG tablet Take 650 mg by mouth every 6 (six) hours as needed.    Marland Kitchen amantadine (SYMMETREL) 100 MG capsule TAKE 1 CAPSULE BY MOUTH TWICE DAILY WITH BREAKFAST AND LUNCH 180 capsule 0  . amLODipine (NORVASC) 10 MG tablet Take 1 tablet (10 mg total) by mouth daily. 30 tablet 3  . ARIPiprazole (ABILIFY) 5 MG tablet TAKE 1 TABLET DAILY 90 tablet 2  . atorvastatin (LIPITOR) 20 MG tablet TAKE 1 TABLET DAILY 90 tablet 3  . bisoprolol (ZEBETA) 5 MG tablet TAKE ONE-HALF (1/2) TABLET DAILY 45 tablet 0  . cholecalciferol (VITAMIN D) 400 units TABS tablet Take 400 Units by mouth.    . clopidogrel (PLAVIX) 75 MG tablet Take 1 tablet (75 mg total) by mouth daily. 90 tablet 2  .  Cyanocobalamin (VITAMIN B-12 CR) 1000 MCG TBCR Take by mouth.    . furosemide (LASIX) 40 MG tablet Take 1 tablet (40 mg total) by mouth 2 (two) times daily. Please keep upcoming appointment for further refills 60 tablet 1  . HYDROcodone-acetaminophen (NORCO) 10-325 MG tablet Take 1 tablet by mouth every 6 (six) hours as needed for moderate pain or severe pain. 100 tablet 0  . omeprazole (PRILOSEC) 20 MG capsule Take 1 capsule (20 mg total) by mouth daily. 90 capsule 3  . oxybutynin (DITROPAN-XL) 5 MG 24 hr tablet Take 5 mg by mouth at bedtime.    Marland Kitchen PROAIR HFA 108 (90 BASE) MCG/ACT inhaler USE 1 TO 2 INHALATIONS EVERY 6 HOURS AS NEEDED FOR WHEEZING OR SHORTNESS OF BREATH 25.5 g 4  . rOPINIRole (REQUIP) 1 MG tablet Take 1 mg by mouth at bedtime.     Marland Kitchen SPIRIVA HANDIHALER 18 MCG inhalation capsule INHALE THE CONTENTS OF 1 CAPSULE WITH 2         INHALATIONS ONCE DAILY AS DIRECTED 1 capsule 11  . venlafaxine XR (EFFEXOR-XR) 150 MG 24 hr capsule Take 150 mg by mouth 2 (two) times daily.      No current facility-administered medications on file prior to visit.    Allergies  Allergen Reactions  . Acetylcholine     unknown  . Alcohol-Sulfur [Sulfur]     unknown  . Amitriptyline     unknown  . Bupivacaine     unknown  .  Clomipramine Hcl     unknown  . Cocaine     unknown  . Desipramine     unknown  . Ergonovine     unknown  . Flecainide     unknown  . Lithium     unknown  . Loxapine     unknown  . Nortriptyline     unknown  . Oxcarbazepine     unknown  . Procainamide     unknown  . Procaine     unknwon  . Propafenone     unknown  . Propofol     unknown  . Trifluoperazine     unknown   Social History   Social History  . Marital status: Married    Spouse name: N/A  . Number of children: N/A  . Years of education: N/A   Occupational History  . retired Retired   Social History Main Topics  . Smoking status: Former Smoker    Packs/day: 2.00    Years: 40.00    Types: Cigarettes    Quit date: 01/04/1993  . Smokeless tobacco: Former Systems developer    Types: Chew  . Alcohol use No     Comment: 6 pack/day - 06/20/15 Pt states wuit drinking in the 1970's  . Drug use: No  . Sexual activity: Not on file   Other Topics Concern  . Not on file   Social History Narrative   Retired from the [employer] after 20 years   [place] Veteran      Review of Systems  All other systems reviewed and are negative.      Objective:   Physical Exam  Constitutional: He is oriented to person, place, and time.  Cardiovascular: Normal rate, regular rhythm and normal heart sounds.   Pulmonary/Chest: Effort normal and breath sounds normal. No respiratory distress. He has no wheezes. He has no rales.  Abdominal: Soft. Bowel sounds are normal.  Neurological: He is alert and oriented to person, place, and time. He  has normal reflexes. No cranial nerve deficit. He exhibits normal muscle tone. Coordination normal.  Psychiatric: He has a normal mood and affect. His speech is normal and behavior is normal. Thought content normal. He is not slowed. Cognition and memory are normal.  Vitals reviewed.         Assessment & Plan:  CKD (chronic kidney disease) stage 3, GFR 30-59 ml/min - Plan: BASIC METABOLIC  PANEL WITH GFR  I really believe the patient was overmedicated at his last visit. Therefore I'll make no changes in his medicine at this time. I am also hopeful that his decline in renal function can be attributed to his overuse of NSAIDs. I explained this in great detail to the patient. He should not use NSAIDs any further. Repeat renal function today. Obtain renal ultrasound. If renal ultrasound is reassuring and his renal function has improved off the NSAIDs, no further workup is necessary. However we will need to create a plan to help manage the patient's pain. He is also using more than 3000 mg a day of Tylenol coupled with hydrocodone, I believe that this is dangerous to his liver. Therefore we agreed that we will try to do is use tramadol 50 mg 3 times a day on a scheduled basis and then hydrocodone/acetaminophen once a day for breakthrough pain. Both the patient and his wife are comfortable with this. The patient would then avoid any NSAIDs and any Tylenol. Await the results of his renal function test prior to initiating this plan

## 2015-09-11 NOTE — Telephone Encounter (Signed)
LMOVM requesting call back.  Gave device clinic phone number for return call.  Patient's home monitor has not been set up.  Need to see if patient received ethernet adapter cable from [employer].  If so, will request manual transmission.  Will plan to check device in person at appointment with Richardson Dopp, PA on 09/22/15 at 9:15am.

## 2015-09-16 ENCOUNTER — Ambulatory Visit
Admission: RE | Admit: 2015-09-16 | Discharge: 2015-09-16 | Disposition: A | Discharge status: Home / Self Care | Payer: Medicare Other | Source: Ambulatory Visit | Attending: Family Medicine | Admitting: Family Medicine

## 2015-09-16 DIAGNOSIS — N289 Disorder of kidney and ureter, unspecified: Secondary | ICD-10-CM

## 2015-09-16 DIAGNOSIS — N2889 Other specified disorders of kidney and ureter: Secondary | ICD-10-CM | POA: Diagnosis not present

## 2015-09-17 ENCOUNTER — Telehealth: Payer: Self-pay | Admitting: Family Medicine

## 2015-09-17 MED ORDER — TRAMADOL HCL 50 MG PO TABS: 50.0000 mg | ORAL_TABLET | Freq: Three times a day (TID) | ORAL | 0 refills | Status: DC

## 2015-09-17 NOTE — Telephone Encounter (Signed)
As per LOV WTP will try patient on Tramadol 50 mg tid - med called to pharm

## 2015-09-17 NOTE — Telephone Encounter (Signed)
Pt's wife is still concerned about pt's behavior as in he just sits and watches the things like the weather channel all day long for several days without watching anything else. He is not sleeping as much but still very sleepy. She was wondering if maybe he should restart the abiilify at a half dose or whatever you recommend???

## 2015-09-17 NOTE — Telephone Encounter (Signed)
[name] [name] [name] [name] calling to say that doug would like tramadol called into [facility] [place] [facility] if possible  [phone]

## 2015-09-17 NOTE — Telephone Encounter (Signed)
LMOVM requesting call back.  Gave device clinic phone number for return call. 

## 2015-09-18 NOTE — Telephone Encounter (Signed)
He is supposed to be on abilify 2.5 mg (1/2 pill) a day, not off the medication?  If he is off the med, resume 2.5 mg a day.  If he is taking 1/2 a pill a day, increase back to 5 mg a day (1 pill).

## 2015-09-19 ENCOUNTER — Encounter (HOSPITAL_COMMUNITY): Payer: Self-pay

## 2015-09-19 ENCOUNTER — Encounter (HOSPITAL_COMMUNITY)
Admission: RE | Admit: 2015-09-19 | Discharge: 2015-09-19 | Disposition: A | Discharge status: Home / Self Care | Payer: Medicare Other | Source: Ambulatory Visit | Attending: Internal Medicine | Admitting: Internal Medicine

## 2015-09-19 VITALS — BP 131/87 | HR 82 | Resp 18 | Ht 69.75 in | Wt 211.9 lb

## 2015-09-19 DIAGNOSIS — J439 Emphysema, unspecified: Secondary | ICD-10-CM | POA: Insufficient documentation

## 2015-09-19 NOTE — Progress Notes (Signed)
[name] [name] 75 y.o. male Pulmonary Rehab Orientation Note Patient arrived today in Cardiac and Pulmonary Rehab for orientation to Pulmonary Rehab. He ambulated from [employer] with minimal difficulty. He has not been prescribed oxygen for home use Color good, skin warm and dry. Patient is oriented to time and place. Patient's medical history, psychosocial health, and medications reviewed. Psychosocial assessment reveals pt lives with their spouse. Pt is currently retired. Pt hobbies include reading, sitting on his deck when the weather is nice,  and traveling. However, he describes mild tension when travel is discussed with his wife. He has always enjoyed using their travel camper, yet his wife no longer wants to travel now that she is retired. Pt reports his stress level is low. Areas of stress/anxiety include his wifes health. She has been diagnosed with treatable lung cancer yet she continues to smoke. Pt does not exhibit signs of depression. PHQ2/9 score 0/na. Pt shows good  coping skills with positive outlook. He was offered emotional support and reassurance. It is questionable if the patient is back to his baseline cognitive status post CVA. Patient feels he does not have any lingering deficits, however per EMR, his wife still has some concerns. Physical assessment reveals heart rate is normal, breath sounds clear to auscultation, no wheezes, rales, or rhonchi. Grip strength equal, strong. Distal pulses palpable. Mild pitting edema noted to ankles bilat. Patient reports he does take medications as prescribed. Patient states he follows a Regular diet. The patient reports no specific efforts to gain or lose weight.. Patient's weight will be monitored closely. Demonstration and practice of PLB using pulse oximeter. Patient able to return demonstration satisfactorily. Safety and hand hygiene in the exercise area reviewed with patient. Patient voices understanding of the information reviewed. Department  expectations discussed with patient and achievable goals were set. The patient shows enthusiasm about attending the program and we look forward to working with this nice gentleman. The patient is scheduled for a 6 min walk test on 09/25/15 and to begin exercise on 10/02/15 in the 1030 class.   45 minutes was spent on a variety of activities such as assessment of the patient, obtaining baseline data including height, weight, BMI, and grip strength, verifying medical history, allergies, and current medications, and teaching patient strategies for performing tasks with less respiratory effort with emphasis on pursed lip breathing.

## 2015-09-21 NOTE — Progress Notes (Signed)
Cardiology Office Note:    Date:  09/22/2015   ID:  [name], DOB April 10, 1940, MRN [id number]  PCP:  [name], MD  Cardiologist:  [name]   Electrophysiologist:  N/a Pulmonology: Dr. Chase Caller Neurology: Dr. Erlinda Hong  Referring MD: [name], MD   Chief Complaint  Patient presents with  . Hospitalization Follow-up    Admx 6/17 with R MCA stroke, ILR implanted   History of Present Illness:    AUDEL ANTONIEWICZ is a 75 y.o. male with a hx of mod non-obstructive CAD on LHC in 0000000, diastolic HF, HTN, HL, GERD, COPD.  He has struggled with bipolar depression.   Last seen in 6/17. Patient was then admitted 6/11-6/14 with R MCA distribution stroke. He was then readmitted 6/12-6/19 with altered mental status secondary to UTI with associated AKI. TEE demonstrated no PFO, no LAA clot and normal thoracic aorta. Patient underwent implantation of Linq ILR by Dr. [name]. He has since had worsening creatinine related to NSAID use which has been discontinued. Most recent creatinine was normal.  He returns for follow-up.  Here with his wife.  He is doing well.  Breathing remains labored but is stable.  He starts pulmonary rehab soon.  He denies chest pain, orthopnea, PND, syncope. He does have LE edema. This is much better while he is on Lasix on a daily basis.    Prior CV studies that were reviewed today include:    Echo 06/16/15 Mild focal basal septal hypertrophy, EF 60-65%, normal wall motion, grade 1 diastolic dysfunction, mild AI  Carotid US 06/16/15 Summary:Bilateral: mild soft plaque CCA and origin ICA. 1-39% ICA plaquing.  Chest CT 6/117 IMPRESSION: 1. The nodules are stable from 06/26/2013. More spiculated nodules inferomedially in the right middle lobe were not previously positive on PET scan and accordingly are likely benign. The 5 by 6 mm nodule is also stable over the past 2 years. This 2 year stability is compatible with benign process, and based on  current guidelines, further follow up surveillance is not required. 2. 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 Recommend annual imaging followup by CTA or MRA. This recommendation follows 2010 ACCF/AHA/AATS/ACR/ASA/SCA/SCAI/SIR/STS/SVM Guidelines for the Diagnosis and Management of Patients with Thoracic Aortic Disease. Circulation. 2010; 121: [id number] 3. Coronary, aortic arch, and branch vessel atherosclerotic vascular disease.  LHC 1/15 LM No significant disease.  LAD: Luminal irregularities.  LCx: serial 50% and 60% stenoses in the mid LCx.  RCA: 50% proximal RCA stenosis.  LVEF is estimated at 55% without significant wall motion abnormalities noted in RAO projection.  Final Conclusions: There is moderate coronary disease involving the mid LCx and the proximal RCA. There are no severe stenoses. I do not think that coronary disease is causing the patient's exertional dyspnea. His LVEDP also is not elevated on the higher Lasix dose. I suspect that at this point COPD is the major cause of his dyspnea.   Echo 12/14 Inferior and distal septal HK, mild LVH, EF 50-55%, mild MR, mild LAE   Past Medical History:  Diagnosis Date  . Arthritis    s/p TKR  . Bladder cancer (HCC)    [facility], [facility] low grade papillary urothileal carcinoma  . BPH (benign prostatic hyperplasia)   . Brugada syndrome    Possible Type II Brugada ECG pattern. No family history of SCD, no syncope, no tachypalpitations.  Marland Kitchen CAD (coronary artery disease)    a. LHC 1/15 - mid LCx 50 and 60%, proximal  RCA 50%  . Chronic diastolic CHF (congestive heart failure) ([place])   . CKD (chronic kidney disease)   . COPD (chronic obstructive pulmonary disease) ([place])    Prior heavy smoker. PFTs (3/11): FVC 87%, FEV1 73%, ratio 0.57, DLCO 75%, TLC 121%. Moderate obstructive defect. PFTs (9/15): Only minimal obstruction, sugPrior heavy smoker. PFTs (3/11): FVC 87%, FEV1 73%, ratio 0.57, DLCO 75%, TLC 121%. Moderate  obstructive defect. PFTs (9/15) minimal obstruction, poss asthma component   . Depression    with bipolar tendencies  . DOE (dyspnea on exertion)    a. Myoview 8/09- EF 57%, no ischemia // b. Myoview (3/11) EF 68%, diaphragmatic attenuation, no ischemia //  c Echo (4/11) EF 0000000, mild diastolic dysfunction, PASP 38 mmHg // d. PFTs 9/15 minimal obstruction  /  e. CT negative for ILD  . GERD (gastroesophageal reflux disease)   . History of Doppler ultrasound    Carotid US 3/11 negative for significant stenosis  //  carotid US 6/17: Mild bilateral plaque, 1-39% ICA  . History of echocardiogram    a. Echo 12/14 Inferior and distal septal HK, mild LVH, EF 50-55%, mild MR, mild LAE  //  b. Echo 6/17: Mild focal basal septal hypertrophy, EF 60-65%, normal wall motion, grade 1 diastolic dysfunction, mild AI  . HLD (hyperlipidemia)   . HTN (hypertension)   . Low testosterone   . Lung nodule    a. CT in 2015 >> PET in 12/15 not sugg of malignancy   . LVH (left ventricular hypertrophy)    a. Echo 12/14 Inferior and distal septal HK, mild LVH, EF 50-55%, mild MR, mild LAE  . Mild dementia   . Mitral regurgitation    Echo (4/11) with PISA ERO 0.3 cm^2 and regurgitant volume 41 mL (moderate MR). Echo (12/14) with mild MR.   . MS (multiple sclerosis) ([place])   . Right bundle branch block   . Stroke ([place])   . Thoracic aortic aneurysm (HCC)    4.1 cm 2017  . Urinary retention with incomplete bladder emptying    receives botox injections and treatment for BPH through [facility]    Past Surgical History:  Procedure Laterality Date  . APPENDECTOMY    . EP IMPLANTABLE DEVICE N/A 06/23/2015   Procedure: Loop Recorder Insertion;  Surgeon: Deboraha Sprang, MD;  Location: [facility];  Service: Cardiovascular;  Laterality: N/A;  . HEMORRHOID SURGERY    . LEFT HEART CATHETERIZATION WITH CORONARY ANGIOGRAM N/A 01/05/2013   Procedure: LEFT HEART CATHETERIZATION WITH CORONARY ANGIOGRAM;  Surgeon: Larey Dresser, MD;  Location: [facility] CATH LAB;  Service: Cardiovascular;  Laterality: N/A;  . ROTATOR CUFF REPAIR    . TEE WITHOUT CARDIOVERSION N/A 06/23/2015   Procedure: TRANSESOPHAGEAL ECHOCARDIOGRAM (TEE);  Surgeon: Fay Records, MD;  Location: [facility] ENDOSCOPY;  Service: Cardiovascular;  Laterality: N/A;  . TONSILLECTOMY    . TOTAL KNEE ARTHROPLASTY Right   . TRANSURETHRAL RESECTION OF PROSTATE      Current Medications: Current Meds  Medication Sig  . acetaminophen (TYLENOL) 325 MG tablet Take 650 mg by mouth every 6 (six) hours as needed.  Marland Kitchen amantadine (SYMMETREL) 100 MG capsule TAKE 1 CAPSULE BY MOUTH TWICE DAILY WITH BREAKFAST AND LUNCH  . amLODipine (NORVASC) 10 MG tablet Take 1 tablet (10 mg total) by mouth daily.  . ARIPiprazole (ABILIFY) 5 MG tablet TAKE 1 TABLET DAILY  . atorvastatin (LIPITOR) 20 MG tablet TAKE 1 TABLET DAILY  . bisoprolol (ZEBETA) 5 MG  tablet Take 1 tablet (5 mg total) by mouth daily.  . cholecalciferol (VITAMIN D) 400 units TABS tablet Take 400 Units by mouth.  . clopidogrel (PLAVIX) 75 MG tablet Take 1 tablet (75 mg total) by mouth daily.  . Cyanocobalamin (VITAMIN B-12 CR) 1000 MCG TBCR Take by mouth.  . furosemide (LASIX) 40 MG tablet Take 1 tablet (40 mg total) by mouth 2 (two) times daily. Please keep upcoming appointment for further refills  . HYDROcodone-acetaminophen (NORCO) 10-325 MG tablet Take 1 tablet by mouth every 6 (six) hours as needed for moderate pain or severe pain.  Marland Kitchen losartan (COZAAR) 50 MG tablet Take 50 mg by mouth daily.  Marland Kitchen PROAIR HFA 108 (90 BASE) MCG/ACT inhaler USE 1 TO 2 INHALATIONS EVERY 6 HOURS AS NEEDED FOR WHEEZING OR SHORTNESS OF BREATH  . rOPINIRole (REQUIP) 1 MG tablet Take 1 mg by mouth at bedtime.   Marland Kitchen SPIRIVA HANDIHALER 18 MCG inhalation capsule INHALE THE CONTENTS OF 1 CAPSULE WITH 2         INHALATIONS ONCE DAILY AS DIRECTED  . traMADol (ULTRAM) 50 MG tablet Take 1 tablet (50 mg total) by mouth 3 (three) times daily.  Marland Kitchen venlafaxine  XR (EFFEXOR-XR) 150 MG 24 hr capsule Take 150 mg by mouth 2 (two) times daily.   . [DISCONTINUED] bisoprolol (ZEBETA) 5 MG tablet TAKE ONE-HALF (1/2) TABLET DAILY       Allergies:   Acetylcholine; Alcohol-sulfur [sulfur]; Amitriptyline; Bupivacaine; Clomipramine hcl; Cocaine; Desipramine; Ergonovine; Flecainide; Lithium; Loxapine; Nortriptyline; Oxcarbazepine; Procainamide; Procaine; Propafenone; Propofol; and Trifluoperazine   Social History   Social History  . Marital status: Married    Spouse name: N/A  . Number of children: N/A  . Years of education: N/A   Occupational History  . retired Retired   Social History Main Topics  . Smoking status: Former Smoker    Packs/day: 2.00    Years: 40.00    Types: Cigarettes    Quit date: 01/04/1993  . Smokeless tobacco: Former Systems developer    Types: Chew     Comment: patient still smokes a pipe daily  . Alcohol use No     Comment: 6 pack/day - 06/20/15 Pt states wuit drinking in the 1970's  . Drug use: No  . Sexual activity: Not Asked   Other Topics Concern  . None   Social History Narrative   Retired from the [employer] after 20 years   [place] Veteran     Family History:  The patient's family history includes Arthritis in his brother; Hypertension in his sister; Kidney cancer in his brother; Other in his brother; Stroke in his father and mother.   ROS:   Please see the history of present illness.    ROS All other systems reviewed and are negative.   EKGs/Labs/Other Test Reviewed:    EKG:  EKG is  ordered today.  The ekg ordered today demonstrates NSR, HR 69, LAD, incomplete RBBB, no change from prior tracings  Recent Labs: 06/26/2015: ALT 29; Hemoglobin 14.8; Platelets 384 09/11/2015: BUN 18; Creat 0.90; Potassium 4.8; Sodium 136   Recent Lipid Panel    Component Value Date/Time   CHOL 140 06/21/2015 0534   TRIG 239 (H) 06/21/2015 0534   HDL 33 (L) 06/21/2015 0534   CHOLHDL 4.2 06/21/2015 0534   VLDL 48 (H) 06/21/2015 0534     LDLCALC 59 06/21/2015 0534   LDLDIRECT 87.0 02/01/2014 1019     Physical Exam:    VS:  BP 140/82  Pulse 69   Ht 5\' 9"  (1.753 m)   Wt 215 lb 1.9 oz (97.6 kg)   BMI 31.77 kg/m     Wt Readings from Last 3 Encounters:  09/22/15 215 lb 1.9 oz (97.6 kg)  09/19/15 211 lb 13.8 oz (96.1 kg)  09/11/15 216 lb (98 kg)     Physical Exam  Constitutional: He is oriented to person, place, and time. He appears well-developed and well-nourished. No distress.  HENT:  Head: Normocephalic and atraumatic.  Eyes: No scleral icterus.  Neck: No JVD present.  Cardiovascular: Normal rate, regular rhythm and normal heart sounds.   No murmur heard. Pulmonary/Chest: He has decreased breath sounds. He has no wheezes. He has no rhonchi. He has no rales.  Abdominal: Soft. There is no tenderness.  Musculoskeletal: He exhibits edema.  Trace bilateral ankle edema  Neurological: He is alert and oriented to person, place, and time.  Skin: Skin is warm and dry.  Psychiatric: He has a normal mood and affect.    ASSESSMENT:    1. History of ischemic right MCA stroke   2. Chronic diastolic CHF (congestive heart failure) ([place])   3. Coronary artery disease involving native coronary artery of native heart without angina pectoris   4. Essential hypertension   5. Hyperlipidemia   6. Thoracic aortic aneurysm without rupture ([place])    PLAN:    In order of problems listed above:  1. S/p R MCA Stroke - Admx in 6/17.  He is s/p ILR.  No significant findings to date.  He remains on Plavix and will FU with Neuro as planned.   2. Chronic diastolic HF - Volume stable. Continue current therapy.  3. CAD - Nonobstructive disease by cardiac catheterization in 2015.  No angina. Continue Plavix, aspirin.  4. HTN -   Blood pressure is borderline control. We discussed life some modifications. If his blood pressure remains above 130/80, consider increasing losartan to 100 mg daily.  5. HL - Managed by primary care.  Continue statin.  6. Ascending thoracic aortic aneurysm - 4.1 cm by CT in 6/17.  Will need FU CT vs MRA in 1 year.   Medication Adjustments/Labs and Tests Ordered: Current medicines are reviewed at length with the patient today.  Concerns regarding medicines are outlined above.  Medication changes, Labs and Tests ordered today are outlined in the Patient Instructions noted below. Patient Instructions  Medication Instructions:  No changes.  Labwork: None today.  Testing/Procedures: You will need a CT scan or MRI of your chest in 06/2016 to follow up on your aneurysm.  We will order that at your next visit.  Follow-Up: [name] or Richardson Dopp, PA-C in 6 months.   Any Other Special Instructions Will Be Listed Below (If Applicable). Stop smoking. Work on weight loss. Decrease caffeine. Exercising at Pulmonary Rehab is a great idea. Our goal for your BP is < 130/80.  Hopefully, these things will help.  If you need a refill on your cardiac medications before your next appointment, please call your pharmacy.   Signed, Richardson Dopp, PA-C  09/22/2015 9:46 AM    [facility] [address], [place], [place]  [address] Phone: [phone]; Fax: [phone]

## 2015-09-22 ENCOUNTER — Telehealth: Payer: Self-pay | Admitting: Family Medicine

## 2015-09-22 ENCOUNTER — Encounter: Payer: Self-pay | Admitting: Physician Assistant

## 2015-09-22 ENCOUNTER — Encounter: Payer: TRICARE For Life (TFL) | Admitting: Physician Assistant

## 2015-09-22 ENCOUNTER — Ambulatory Visit (INDEPENDENT_AMBULATORY_CARE_PROVIDER_SITE_OTHER): Payer: Medicare Other | Admitting: Physician Assistant

## 2015-09-22 VITALS — BP 140/82 | HR 69 | Ht 69.0 in | Wt 215.1 lb

## 2015-09-22 DIAGNOSIS — I639 Cerebral infarction, unspecified: Secondary | ICD-10-CM

## 2015-09-22 DIAGNOSIS — I712 Thoracic aortic aneurysm, without rupture, unspecified: Secondary | ICD-10-CM

## 2015-09-22 DIAGNOSIS — I5032 Chronic diastolic (congestive) heart failure: Secondary | ICD-10-CM

## 2015-09-22 DIAGNOSIS — E785 Hyperlipidemia, unspecified: Secondary | ICD-10-CM

## 2015-09-22 DIAGNOSIS — Z8673 Personal history of transient ischemic attack (TIA), and cerebral infarction without residual deficits: Secondary | ICD-10-CM

## 2015-09-22 DIAGNOSIS — I2510 Atherosclerotic heart disease of native coronary artery without angina pectoris: Secondary | ICD-10-CM | POA: Diagnosis not present

## 2015-09-22 DIAGNOSIS — I10 Essential (primary) hypertension: Secondary | ICD-10-CM

## 2015-09-22 MED ORDER — BISOPROLOL FUMARATE 5 MG PO TABS: 5.0000 mg | ORAL_TABLET | Freq: Every day | ORAL | Status: DC

## 2015-09-22 NOTE — Telephone Encounter (Signed)
I could biopsy it.  I could not remove it all.  Why won't Dr. Lacinda Axon biopsy it before he operates????

## 2015-09-22 NOTE — Progress Notes (Signed)
He can see Dr. Saunders Revel.

## 2015-09-22 NOTE — Patient Instructions (Signed)
Medication Instructions:  No changes.  Labwork: None today.  Testing/Procedures: You will need a CT scan or MRI of your chest in 06/2016 to follow up on your aneurysm.  We will order that at your next visit.  Follow-Up: [name] or Richardson Dopp, PA-C in 6 months.   Any Other Special Instructions Will Be Listed Below (If Applicable). Stop smoking. Work on weight loss. Decrease caffeine. Exercising at Pulmonary Rehab is a great idea. Our goal for your BP is < 130/80.  Hopefully, these things will help.  If you need a refill on your cardiac medications before your next appointment, please call your pharmacy.

## 2015-09-22 NOTE — Telephone Encounter (Signed)
[name] wife called states Dr. Lacinda Axon states patient needs a biopsy of his nose on the left side before he can be scheduled. Is this something Dr. Dennard Schaumann could do. She states the last time Dr. Lacinda Axon had to take half of his nose.  CB# [phone]

## 2015-09-23 ENCOUNTER — Ambulatory Visit (INDEPENDENT_AMBULATORY_CARE_PROVIDER_SITE_OTHER): Payer: Medicare Other

## 2015-09-23 ENCOUNTER — Encounter (HOSPITAL_COMMUNITY): Payer: Self-pay | Admitting: *Deleted

## 2015-09-23 DIAGNOSIS — Z23 Encounter for immunization: Secondary | ICD-10-CM | POA: Diagnosis not present

## 2015-09-25 ENCOUNTER — Encounter (HOSPITAL_COMMUNITY)
Admission: RE | Admit: 2015-09-25 | Discharge: 2015-09-25 | Disposition: A | Discharge status: Home / Self Care | Payer: Medicare Other | Source: Ambulatory Visit | Attending: Internal Medicine | Admitting: Internal Medicine

## 2015-09-25 DIAGNOSIS — J439 Emphysema, unspecified: Secondary | ICD-10-CM

## 2015-09-25 NOTE — Progress Notes (Signed)
Pulmonary Individual Treatment Plan  Patient Details  Name: [name] [name] MRN: [id number] Date of Birth: 11-Jun-1940 Referring Provider:   April Manson Pulmonary Rehab Walk Test from 09/25/2015 in [place]  Referring Provider  Dr. Chase Caller      Initial Encounter Date:  Flowsheet Row Pulmonary Rehab Walk Test from 09/25/2015 in [place]  Date  09/25/15  Referring Provider  Dr. Chase Caller      Visit Diagnosis: Pulmonary emphysema, unspecified emphysema type ([place])  Patient's Home Medications on Admission:   Current Outpatient Prescriptions:  .  acetaminophen (TYLENOL) 325 MG tablet, Take 650 mg by mouth every 6 (six) hours as needed., Disp: , Rfl:  .  amantadine (SYMMETREL) 100 MG capsule, TAKE 1 CAPSULE BY MOUTH TWICE DAILY WITH BREAKFAST AND LUNCH, Disp: 180 capsule, Rfl: 0 .  amLODipine (NORVASC) 10 MG tablet, Take 1 tablet (10 mg total) by mouth daily., Disp: 30 tablet, Rfl: 3 .  ARIPiprazole (ABILIFY) 5 MG tablet, TAKE 1 TABLET DAILY, Disp: 90 tablet, Rfl: 2 .  atorvastatin (LIPITOR) 20 MG tablet, TAKE 1 TABLET DAILY, Disp: 90 tablet, Rfl: 3 .  bisoprolol (ZEBETA) 5 MG tablet, Take 1 tablet (5 mg total) by mouth daily., Disp: , Rfl:  .  cholecalciferol (VITAMIN D) 400 units TABS tablet, Take 400 Units by mouth., Disp: , Rfl:  .  clopidogrel (PLAVIX) 75 MG tablet, Take 1 tablet (75 mg total) by mouth daily., Disp: 90 tablet, Rfl: 2 .  Cyanocobalamin (VITAMIN B-12 CR) 1000 MCG TBCR, Take by mouth., Disp: , Rfl:  .  furosemide (LASIX) 40 MG tablet, Take 1 tablet (40 mg total) by mouth 2 (two) times daily. Please keep upcoming appointment for further refills, Disp: 60 tablet, Rfl: 1 .  HYDROcodone-acetaminophen (NORCO) 10-325 MG tablet, Take 1 tablet by mouth every 6 (six) hours as needed for moderate pain or severe pain., Disp: 100 tablet, Rfl: 0 .  losartan (COZAAR) 50 MG tablet, Take 50 mg by mouth daily., Disp: ,  Rfl:  .  PROAIR HFA 108 (90 BASE) MCG/ACT inhaler, USE 1 TO 2 INHALATIONS EVERY 6 HOURS AS NEEDED FOR WHEEZING OR SHORTNESS OF BREATH, Disp: 25.5 g, Rfl: 4 .  rOPINIRole (REQUIP) 1 MG tablet, Take 1 mg by mouth at bedtime. , Disp: , Rfl:  .  SPIRIVA HANDIHALER 18 MCG inhalation capsule, INHALE THE CONTENTS OF 1 CAPSULE WITH 2         INHALATIONS ONCE DAILY AS DIRECTED, Disp: 1 capsule, Rfl: 11 .  traMADol (ULTRAM) 50 MG tablet, Take 1 tablet (50 mg total) by mouth 3 (three) times daily., Disp: 90 tablet, Rfl: 0 .  venlafaxine XR (EFFEXOR-XR) 150 MG 24 hr capsule, Take 150 mg by mouth 2 (two) times daily. , Disp: , Rfl:   Past Medical History: Past Medical History:  Diagnosis Date  . Arthritis    s/p TKR  . Bladder cancer (HCC)    [facility], [facility] low grade papillary urothileal carcinoma  . BPH (benign prostatic hyperplasia)   . Brugada syndrome    Possible Type II Brugada ECG pattern. No family history of SCD, no syncope, no tachypalpitations.  [place] CAD (coronary artery disease)    a. LHC 1/15 - mid LCx 50 and 60%, proximal RCA 50%  . Chronic diastolic CHF (congestive heart failure) ([place])   . CKD (chronic kidney disease)   . COPD (chronic obstructive pulmonary disease) ([place])    Prior heavy smoker. PFTs (3/11):  FVC 87%, FEV1 73%, ratio 0.57, DLCO 75%, TLC 121%. Moderate obstructive defect. PFTs (9/15): Only minimal obstruction, sugPrior heavy smoker. PFTs (3/11): FVC 87%, FEV1 73%, ratio 0.57, DLCO 75%, TLC 121%. Moderate obstructive defect. PFTs (9/15) minimal obstruction, poss asthma component   . Depression    with bipolar tendencies  . DOE (dyspnea on exertion)    a. Myoview 8/09- EF 57%, no ischemia // b. Myoview (3/11) EF 68%, diaphragmatic attenuation, no ischemia //  c Echo (4/11) EF 0000000, mild diastolic dysfunction, PASP 38 mmHg // d. PFTs 9/15 minimal obstruction  /  e. CT negative for ILD  . GERD (gastroesophageal reflux disease)   . History of Doppler ultrasound    Carotid US 3/11  negative for significant stenosis  //  carotid US 6/17: Mild bilateral plaque, 1-39% ICA  . History of echocardiogram    a. Echo 12/14 Inferior and distal septal HK, mild LVH, EF 50-55%, mild MR, mild LAE  //  b. Echo 6/17: Mild focal basal septal hypertrophy, EF 60-65%, normal wall motion, grade 1 diastolic dysfunction, mild AI  . HLD (hyperlipidemia)   . HTN (hypertension)   . Low testosterone   . Lung nodule    a. CT in 2015 >> PET in 12/15 not sugg of malignancy   . LVH (left ventricular hypertrophy)    a. Echo 12/14 Inferior and distal septal HK, mild LVH, EF 50-55%, mild MR, mild LAE  . Mild dementia   . Mitral regurgitation    Echo (4/11) with PISA ERO 0.3 cm^2 and regurgitant volume 41 mL (moderate MR). Echo (12/14) with mild MR.   . MS (multiple sclerosis) ([place])   . Right bundle branch block   . Stroke ([place])   . Thoracic aortic aneurysm (HCC)    4.1 cm 2017  . Urinary retention with incomplete bladder emptying    receives botox injections and treatment for BPH through [facility]    Tobacco Use: History  Smoking Status  . Former Smoker  . Packs/day: 2.00  . Years: 40.00  . Types: Cigarettes  . Quit date: 01/04/1993  Smokeless Tobacco  . Former Systems developer  . Types: Chew    Comment: patient still smokes a pipe daily    Labs: Recent Review Flowsheet Data    Labs for ITP Cardiac and Pulmonary Rehab Latest Ref Rng & Units 03/31/2015 06/16/2015 06/20/2015 06/21/2015 06/26/2015   Cholestrol 0 - 200 mg/dL 158 185 - 140 -   LDLCALC 0 - 99 mg/dL 76 91 - 59 -   LDLDIRECT mg/dL - - - - -   HDL >40 mg/dL 48 43 - 33(L) -   Trlycerides <150 mg/dL 170(H) 256(H) - 239(H) -   Hemoglobin A1c <5.7 % 6.4(H) 6.4(H) - - 6.3(H)   TCO2 0 - 100 mmol/L - - 27 - -      Capillary Blood Glucose: Lab Results  Component Value Date   GLUCAP 80 06/20/2015   GLUCAP 123 (H) 06/15/2015   GLUCAP 94 12/11/2013     ADL UCSD:     Pulmonary Assessment Scores    Row Name 09/23/15 1621         ADL UCSD    ADL Phase Entry     SOB Score total 15        Pulmonary Function Assessment:     Pulmonary Function Assessment - 09/19/15 1022      Breath   Bilateral Breath Sounds Clear   Shortness of Breath Yes;Limiting activity  Exercise Target Goals: Date: 09/25/15  Exercise Program Goal: Individual exercise prescription set with THRR, safety & activity barriers. Participant demonstrates ability to understand and report RPE using BORG scale, to self-measure pulse accurately, and to acknowledge the importance of the exercise prescription.  Exercise Prescription Goal: Starting with aerobic activity 30 plus minutes a day, 3 days per week for initial exercise prescription. Provide home exercise prescription and guidelines that participant acknowledges understanding prior to discharge.  Activity Barriers & Risk Stratification:     Activity Barriers & Cardiac Risk Stratification - 09/19/15 1021      Activity Barriers & Cardiac Risk Stratification   Activity Barriers Shortness of Breath;Left Knee Replacement;Right Knee Replacement;Arthritis;Joint Problems;Muscular Weakness      6 Minute Walk:     6 Minute Walk    Row Name 09/25/15 1624         6 Minute Walk   Phase Initial     Distance 1100 feet     Walk Time 6 minutes     # of Rest Breaks 0     MPH 2.08     METS 2.53     RPE 12     Perceived Dyspnea  2     Symptoms Yes (comment)     Comments back ache     Resting HR 70 bpm     Resting BP 124/64     Max Ex. HR 97 bpm     Max Ex. BP 140/68       Interval HR   Baseline HR 70     1 Minute HR 80     2 Minute HR 91     3 Minute HR 97     4 Minute HR 87     5 Minute HR 96     6 Minute HR 96     2 Minute Post HR 74     Interval Heart Rate? Yes       Interval Oxygen   Interval Oxygen? Yes     Baseline Oxygen Saturation % 93 %     Baseline Liters of Oxygen 0 L     1 Minute Oxygen Saturation % 91 %     1 Minute Liters of Oxygen 0 L     2 Minute Oxygen Saturation  % 92 %     2 Minute Liters of Oxygen 0 L     3 Minute Oxygen Saturation % 92 %     3 Minute Liters of Oxygen 0 L     4 Minute Oxygen Saturation % 93 %     4 Minute Liters of Oxygen 0 L     5 Minute Oxygen Saturation % 93 %     5 Minute Liters of Oxygen 0 L     6 Minute Oxygen Saturation % 93 %     6 Minute Liters of Oxygen 0 L     2 Minute Post Oxygen Saturation % 96 %     2 Minute Post Liters of Oxygen 0 L        Initial Exercise Prescription:     Initial Exercise Prescription - 09/25/15 1600      Date of Initial Exercise RX and Referring Provider   Date 09/25/15   Referring Provider Dr. Chase Caller     Bike   Level 0.5   Minutes 17     NuStep   Level 2   Minutes 17   METs 1.5     Track  Laps 6   Minutes 17     Prescription Details   Frequency (times per week) 2   Duration Progress to 45 minutes of aerobic exercise without signs/symptoms of physical distress     Intensity   THRR 40-80% of Max Heartrate 59-118   Ratings of Perceived Exertion 11-13   Perceived Dyspnea 0-4     Progression   Progression Continue progressive overload as per policy without signs/symptoms or physical distress.     Resistance Training   Training Prescription Yes   Weight blue bands   Reps 10-12      Perform Capillary Blood Glucose checks as needed.  Exercise Prescription Changes:   Exercise Comments:   Discharge Exercise Prescription (Final Exercise Prescription Changes):    Nutrition:  Target Goals: Understanding of nutrition guidelines, daily intake of sodium 1500mg , cholesterol 200mg , calories 30% from fat and 7% or less from saturated fats, daily to have 5 or more servings of fruits and vegetables.  Biometrics:     Pre Biometrics - 09/19/15 1056      Pre Biometrics   Grip Strength 35 kg       Nutrition Therapy Plan and Nutrition Goals:   Nutrition Discharge: Rate Your Plate Scores:   Psychosocial: Target Goals: Acknowledge presence or absence of  depression, maximize coping skills, provide positive support system. Participant is able to verbalize types and ability to use techniques and skills needed for reducing stress and depression.  Initial Review & Psychosocial Screening:     Initial Psych Review & Screening - 09/19/15 [place]? Yes     Barriers   Psychosocial barriers to participate in program Psychosocial barriers identified (see note)     Screening Interventions   Interventions Encouraged to exercise      Quality of Life Scores:     Quality of Life - 09/23/15 1622      Quality of Life Scores   Health/Function Pre 14.73 %   Socioeconomic Pre 21.07 %   Psych/Spiritual Pre 15.36 %   Family Pre 3 %   GLOBAL Pre 14.79 %      PHQ-9: Recent Review Flowsheet Data    Depression screen [facility] 2/9 09/19/2015 07/04/2015 07/02/2015   Decreased Interest 0 3 0   Down, Depressed, Hopeless 0 3 0   PHQ - 2 Score 0 6 0   Altered sleeping - 2 -   Tired, decreased energy - 3 -   Change in appetite - 3 -   Feeling bad or failure about yourself  - 3 -   Trouble concentrating - 2 -   Moving slowly or fidgety/restless - 0 -   Suicidal thoughts - 0 -   PHQ-9 Score - 19 -   Difficult doing work/chores - Extremely dIfficult -      Psychosocial Evaluation and Intervention:     Psychosocial Evaluation - 09/19/15 1037      Psychosocial Evaluation & Interventions   Interventions Encouraged to exercise with the program and follow exercise prescription      Psychosocial Re-Evaluation:  Education: Education Goals: Education classes will be provided on a weekly basis, covering required topics. Participant will state understanding/return demonstration of topics presented.  Learning Barriers/Preferences:     Learning Barriers/Preferences - 09/19/15 1022      Learning Barriers/Preferences   Learning Barriers None   Learning Preferences Computer/Internet;Written Material;Skilled  Demonstration;Individual Instruction;Group Instruction      Education Topics: Risk Factor  Reduction:  -Group instruction that is supported by a PowerPoint presentation. Instructor discusses the definition of a risk factor, different risk factors for pulmonary disease, and how the heart and lungs work together.     Nutrition for Pulmonary Patient:  -Group instruction provided by PowerPoint slides, verbal discussion, and written materials to support subject matter. The instructor gives an explanation and review of healthy diet recommendations, which includes a discussion on weight management, recommendations for fruit and vegetable consumption, as well as protein, fluid, caffeine, fiber, sodium, sugar, and alcohol. Tips for eating when patients are short of breath are discussed.   Pursed Lip Breathing:  -Group instruction that is supported by demonstration and informational handouts. Instructor discusses the benefits of pursed lip and diaphragmatic breathing and detailed demonstration on how to preform both.     Oxygen Safety:  -Group instruction provided by PowerPoint, verbal discussion, and written material to support subject matter. There is an overview of "What is Oxygen" and "Why do we need it".  Instructor also reviews how to create a safe environment for oxygen use, the importance of using oxygen as prescribed, and the risks of noncompliance. There is a brief discussion on traveling with oxygen and resources the patient may utilize.   Oxygen Equipment:  -Group instruction provided by [facility] Staff utilizing handouts, written materials, and equipment demonstrations.   Signs and Symptoms:  -Group instruction provided by written material and verbal discussion to support subject matter. Warning signs and symptoms of infection, stroke, and heart attack are reviewed and when to call the physician/911 reinforced. Tips for preventing the spread of infection discussed.   Advanced  Directives:  -Group instruction provided by verbal instruction and written material to support subject matter. Instructor reviews Advanced Directive laws and proper instruction for filling out document.   Pulmonary Video:  -Group video education that reviews the importance of medication and oxygen compliance, exercise, good nutrition, pulmonary hygiene, and pursed lip and diaphragmatic breathing for the pulmonary patient.   Exercise for the Pulmonary Patient:  -Group instruction that is supported by a PowerPoint presentation. Instructor discusses benefits of exercise, core components of exercise, frequency, duration, and intensity of an exercise routine, importance of utilizing pulse oximetry during exercise, safety while exercising, and options of places to exercise outside of rehab.     Pulmonary Medications:  -Verbally interactive group education provided by instructor with focus on inhaled medications and proper administration.   Anatomy and Physiology of the Respiratory System and Intimacy:  -Group instruction provided by PowerPoint, verbal discussion, and written material to support subject matter. Instructor reviews respiratory cycle and anatomical components of the respiratory system and their functions. Instructor also reviews differences in obstructive and restrictive respiratory diseases with examples of each. Intimacy, Sex, and Sexuality differences are reviewed with a discussion on how relationships can change when diagnosed with pulmonary disease. Common sexual concerns are reviewed.   Knowledge Questionnaire Score:     Knowledge Questionnaire Score - 09/23/15 1620      Knowledge Questionnaire Score   Pre Score 6/13      Core Components/Risk Factors/Patient Goals at Admission:     Personal Goals and Risk Factors at Admission - 09/19/15 1023      Core Components/Risk Factors/Patient Goals on Admission    Weight Management Yes;Obesity;Weight Loss   Intervention Weight  Management/Obesity: Establish reasonable short term and long term weight goals.;Obesity: Provide education and appropriate resources to help participant work on and attain dietary goals.   Expected Outcomes Short Term:  Continue to assess and modify interventions until short term weight is achieved;Long Term: Adherence to nutrition and physical activity/exercise program aimed toward attainment of established weight goal;Weight Loss: Understanding of general recommendations for a balanced deficit meal plan, which promotes 1-2 lb weight loss per week and includes a negative energy balance of [phone] kcal/d;Understanding recommendations for meals to include 15-35% energy as protein, 25-35% energy from fat, 35-60% energy from carbohydrates, less than 200mg  of dietary cholesterol, 20-35 gm of total fiber daily;Understanding of distribution of calorie intake throughout the day with the consumption of 4-5 meals/snacks   Sedentary Yes   Intervention Provide advice, education, support and counseling about physical activity/exercise needs.;Develop an individualized exercise prescription for aerobic and resistive training based on initial evaluation findings, risk stratification, comorbidities and participant's personal goals.   Expected Outcomes Achievement of increased cardiorespiratory fitness and enhanced flexibility, muscular endurance and strength shown through measurements of functional capacity and personal statement of participant.   Increase Strength and Stamina Yes   Intervention Provide advice, education, support and counseling about physical activity/exercise needs.;Develop an individualized exercise prescription for aerobic and resistive training based on initial evaluation findings, risk stratification, comorbidities and participant's personal goals.   Expected Outcomes Achievement of increased cardiorespiratory fitness and enhanced flexibility, muscular endurance and strength shown through measurements  of functional capacity and personal statement of participant.   Tobacco Cessation Yes   Intervention Assist the participant in steps to quit. Provide individualized education and counseling about committing to Tobacco Cessation, relapse prevention, and pharmacological support that can be provided by physician.;Advice worker, assist with locating and accessing local/national Quit Smoking programs, and support quit date choice.   Expected Outcomes Short Term: Will demonstrate readiness to quit, by selecting a quit date.;Long Term: Complete abstinence from all tobacco products for at least 12 months from quit date.;Short Term: Will quit all tobacco product use, adhering to prevention of relapse plan.   Improve shortness of breath with ADL's Yes   Intervention Provide education, individualized exercise plan and daily activity instruction to help decrease symptoms of SOB with activities of daily living.   Expected Outcomes Short Term: Achieves a reduction of symptoms when performing activities of daily living.   Develop more efficient breathing techniques such as purse lipped breathing and diaphragmatic breathing; and practicing self-pacing with activity Yes   Intervention Provide education, demonstration and support about specific breathing techniuqes utilized for more efficient breathing. Include techniques such as pursed lipped breathing, diaphragmatic breathing and self-pacing activity.   Expected Outcomes Short Term: Participant will be able to demonstrate and use breathing techniques as needed throughout daily activities.      Core Components/Risk Factors/Patient Goals Review:    Core Components/Risk Factors/Patient Goals at Discharge (Final Review):    ITP Comments:   Comments:

## 2015-09-30 ENCOUNTER — Ambulatory Visit (INDEPENDENT_AMBULATORY_CARE_PROVIDER_SITE_OTHER): Payer: Medicare Other | Admitting: Family Medicine

## 2015-09-30 ENCOUNTER — Encounter: Payer: Self-pay | Admitting: Family Medicine

## 2015-09-30 VITALS — BP 144/80 | HR 78 | Temp 97.5°F | Resp 18 | Ht 71.0 in | Wt 212.0 lb

## 2015-09-30 DIAGNOSIS — D485 Neoplasm of uncertain behavior of skin: Secondary | ICD-10-CM | POA: Diagnosis not present

## 2015-09-30 DIAGNOSIS — I639 Cerebral infarction, unspecified: Secondary | ICD-10-CM | POA: Diagnosis not present

## 2015-09-30 NOTE — Telephone Encounter (Signed)
Pt scheduled for bx

## 2015-09-30 NOTE — Progress Notes (Signed)
Subjective:    Patient ID: [name] [name], male    DOB: 1940/12/14, 75 y.o.   MRN: [id number]  HPI A she has a history of skin cancer on the right side of his nose above the right nostril. Recently he has noticed a scaly papule on the left side of his nose just above the nostril. It is difficult to see today. Is approximately 2-3 mm in diameter. You can feel it better than see it. It is a small skin colored papule with a scaly texture. Difficult to determine whether this a [place] keratosis versus an early cancer given his history he is obviously concerned. He is requesting treatment. Given the location and the size, I believe a shave biopsy was too aggressive given the risk of scarring. I recommended trying cryotherapy Past Medical History:  Diagnosis Date  . Arthritis    s/p TKR  . Bladder cancer (HCC)    [facility] low grade papillary urothileal carcinoma  . BPH (benign prostatic hyperplasia)   . Brugada syndrome    Possible Type II Brugada ECG pattern. No family history of SCD, no syncope, no tachypalpitations.  Marland Kitchen CAD (coronary artery disease)    a. LHC 1/15 - mid LCx 50 and 60%, proximal RCA 50%  . Chronic diastolic CHF (congestive heart failure) ([place])   . CKD (chronic kidney disease)   . COPD (chronic obstructive pulmonary disease) ([place])    Prior heavy smoker. PFTs (3/11): FVC 87%, FEV1 73%, ratio 0.57, DLCO 75%, TLC 121%. Moderate obstructive defect. PFTs (9/15): Only minimal obstruction, sugPrior heavy smoker. PFTs (3/11): FVC 87%, FEV1 73%, ratio 0.57, DLCO 75%, TLC 121%. Moderate obstructive defect. PFTs (9/15) minimal obstruction, poss asthma component   . Depression    with bipolar tendencies  . DOE (dyspnea on exertion)    a. Myoview 8/09- EF 57%, no ischemia // b. Myoview (3/11) EF 68%, diaphragmatic attenuation, no ischemia //  c Echo (4/11) EF 0000000, mild diastolic dysfunction, PASP 38 mmHg // d. PFTs 9/15 minimal obstruction  /  e. CT negative for ILD  . GERD  (gastroesophageal reflux disease)   . History of Doppler ultrasound    Carotid US 3/11 negative for significant stenosis  //  carotid US 6/17: Mild bilateral plaque, 1-39% ICA  . History of echocardiogram    a. Echo 12/14 Inferior and distal septal HK, mild LVH, EF 50-55%, mild MR, mild LAE  //  b. Echo 6/17: Mild focal basal septal hypertrophy, EF 60-65%, normal wall motion, grade 1 diastolic dysfunction, mild AI  . HLD (hyperlipidemia)   . HTN (hypertension)   . Low testosterone   . Lung nodule    a. CT in 2015 >> PET in 12/15 not sugg of malignancy   . LVH (left ventricular hypertrophy)    a. Echo 12/14 Inferior and distal septal HK, mild LVH, EF 50-55%, mild MR, mild LAE  . Mild dementia   . Mitral regurgitation    Echo (4/11) with PISA ERO 0.3 cm^2 and regurgitant volume 41 mL (moderate MR). Echo (12/14) with mild MR.   . MS (multiple sclerosis) ([place])   . Right bundle branch block   . Stroke ([place])   . Thoracic aortic aneurysm (HCC)    4.1 cm 2017  . Urinary retention with incomplete bladder emptying    receives botox injections and treatment for BPH through [facility]   Past Surgical History:  Procedure Laterality Date  . APPENDECTOMY    . EP IMPLANTABLE DEVICE N/A  06/23/2015   Procedure: Loop Recorder Insertion;  Surgeon: Deboraha Sprang, MD;  Location: [place] [facility];  Service: Cardiovascular;  Laterality: N/A;  . HEMORRHOID SURGERY    . LEFT HEART CATHETERIZATION WITH CORONARY ANGIOGRAM N/A 01/05/2013   Procedure: LEFT HEART CATHETERIZATION WITH CORONARY ANGIOGRAM;  Surgeon: Larey Dresser, MD;  Location: [facility] CATH LAB;  Service: Cardiovascular;  Laterality: N/A;  . ROTATOR CUFF REPAIR    . TEE WITHOUT CARDIOVERSION N/A 06/23/2015   Procedure: TRANSESOPHAGEAL ECHOCARDIOGRAM (TEE);  Surgeon: Fay Records, MD;  Location: [facility] ENDOSCOPY;  Service: Cardiovascular;  Laterality: N/A;  . TONSILLECTOMY    . TOTAL KNEE ARTHROPLASTY Right   . TRANSURETHRAL RESECTION OF PROSTATE      Current Outpatient Prescriptions on File Prior to Visit  Medication Sig Dispense Refill  . acetaminophen (TYLENOL) 325 MG tablet Take 650 mg by mouth every 6 (six) hours as needed.    Marland Kitchen amantadine (SYMMETREL) 100 MG capsule TAKE 1 CAPSULE BY MOUTH TWICE DAILY WITH BREAKFAST AND LUNCH 180 capsule 0  . amLODipine (NORVASC) 10 MG tablet Take 1 tablet (10 mg total) by mouth daily. 30 tablet 3  . ARIPiprazole (ABILIFY) 5 MG tablet TAKE 1 TABLET DAILY 90 tablet 2  . atorvastatin (LIPITOR) 20 MG tablet TAKE 1 TABLET DAILY 90 tablet 3  . bisoprolol (ZEBETA) 5 MG tablet Take 1 tablet (5 mg total) by mouth daily.    . cholecalciferol (VITAMIN D) 400 units TABS tablet Take 400 Units by mouth.    . clopidogrel (PLAVIX) 75 MG tablet Take 1 tablet (75 mg total) by mouth daily. 90 tablet 2  . Cyanocobalamin (VITAMIN B-12 CR) 1000 MCG TBCR Take by mouth.    . furosemide (LASIX) 40 MG tablet Take 1 tablet (40 mg total) by mouth 2 (two) times daily. Please keep upcoming appointment for further refills 60 tablet 1  . HYDROcodone-acetaminophen (NORCO) 10-325 MG tablet Take 1 tablet by mouth every 6 (six) hours as needed for moderate pain or severe pain. 100 tablet 0  . losartan (COZAAR) 50 MG tablet Take 50 mg by mouth daily.    Marland Kitchen PROAIR HFA 108 (90 BASE) MCG/ACT inhaler USE 1 TO 2 INHALATIONS EVERY 6 HOURS AS NEEDED FOR WHEEZING OR SHORTNESS OF BREATH 25.5 g 4  . rOPINIRole (REQUIP) 1 MG tablet Take 1 mg by mouth at bedtime.     Marland Kitchen SPIRIVA HANDIHALER 18 MCG inhalation capsule INHALE THE CONTENTS OF 1 CAPSULE WITH 2         INHALATIONS ONCE DAILY AS DIRECTED 1 capsule 11  . traMADol (ULTRAM) 50 MG tablet Take 1 tablet (50 mg total) by mouth 3 (three) times daily. 90 tablet 0  . venlafaxine XR (EFFEXOR-XR) 150 MG 24 hr capsule Take 150 mg by mouth 2 (two) times daily.      No current facility-administered medications on file prior to visit.    Allergies  Allergen Reactions  . Acetylcholine     unknown  .  Alcohol-Sulfur [Sulfur]     unknown  . Amitriptyline     unknown  . Bupivacaine     unknown  . Clomipramine Hcl     unknown  . Cocaine     unknown  . Desipramine     unknown  . Ergonovine     unknown  . Flecainide     unknown  . Lithium     unknown  . Loxapine     unknown  . Nortriptyline  unknown  . Oxcarbazepine     unknown  . Procainamide     unknown  . Procaine     unknwon  . Propafenone     unknown  . Propofol     unknown  . Trifluoperazine     unknown   Social History   Social History  . Marital status: Married    Spouse name: N/A  . Number of children: N/A  . Years of education: N/A   Occupational History  . retired Retired   Social History Main Topics  . Smoking status: Former Smoker    Packs/day: 2.00    Years: 40.00    Types: Cigarettes    Quit date: 01/04/1993  . Smokeless tobacco: Former Systems developer    Types: Chew     Comment: patient still smokes a pipe daily  . Alcohol use No     Comment: 6 pack/day - 06/20/15 Pt states wuit drinking in the 1970's  . Drug use: No  . Sexual activity: Not on file   Other Topics Concern  . Not on file   Social History Narrative   Retired from the [employer] after 20 years   [place] Veteran      Review of Systems  All other systems reviewed and are negative.      Objective:   Physical Exam  Cardiovascular: Normal rate, regular rhythm and normal heart sounds.   Pulmonary/Chest: Effort normal and breath sounds normal.  Vitals reviewed.         Assessment & Plan:  Neoplasm of uncertain behavior of skin  The lesion on the tip of the nose was treated with liquid nitrogen cryotherapy for a total of 30 seconds. Wound care was discussed. Recheck if the lesion persist after 4 weeks

## 2015-10-02 ENCOUNTER — Encounter (HOSPITAL_COMMUNITY)
Admission: RE | Admit: 2015-10-02 | Discharge: 2015-10-02 | Disposition: A | Discharge status: Home / Self Care | Payer: Medicare Other | Source: Ambulatory Visit | Attending: Internal Medicine | Admitting: Internal Medicine

## 2015-10-02 ENCOUNTER — Other Ambulatory Visit: Payer: Self-pay | Admitting: Family Medicine

## 2015-10-02 VITALS — Wt 210.8 lb

## 2015-10-02 DIAGNOSIS — J439 Emphysema, unspecified: Secondary | ICD-10-CM | POA: Diagnosis not present

## 2015-10-02 NOTE — Progress Notes (Signed)
Daily Session Note  Patient Details  Name: [name] [name] MRN: [id number] Date of Birth: [birth year] Referring Provider:   April Manson Pulmonary Rehab Walk Test from 09/25/2015 in [place]  Referring Provider  Dr. Chase Caller      Encounter Date: 10/02/2015  Check In:     Session Check In - 10/02/15 1030      Check-In   Location [facility]   Staff Present Su Hilt, MS, ACSM RCEP, Exercise Physiologist;Joan Leonia Reeves, RN, BSN;Lisa Hughes, RN;Portia Rollene Rotunda, RN, BSN   Supervising physician immediately available to respond to emergencies [facility] immediately available   Physician(s) Dr. Cathlean Sauer   Medication changes reported     No   Fall or balance concerns reported    No   Warm-up and Cool-down Performed as group-led Location manager Performed Yes   VAD Patient? No     Pain Assessment   Currently in Pain? No/denies   Multiple Pain Sites No      Capillary Blood Glucose: No results found for this or any previous visit (from the past 24 hour(s)).      Exercise Prescription Changes - 10/02/15 1200      Response to Exercise   Blood Pressure (Admit) 120/60   Blood Pressure (Exercise) 150/70   Blood Pressure (Exit) 134/82   Heart Rate (Admit) 81 bpm   Heart Rate (Exercise) 95 bpm   Heart Rate (Exit) 87 bpm   Oxygen Saturation (Admit) 96 %   Oxygen Saturation (Exercise) 97 %   Oxygen Saturation (Exit) 97 %   Rating of Perceived Exertion (Exercise) 11   Perceived Dyspnea (Exercise) 0   Duration Progress to 45 minutes of aerobic exercise without signs/symptoms of physical distress   Intensity THRR unchanged     Progression   Progression Continue progressive overload as per policy without signs/symptoms or physical distress.     Resistance Training   Training Prescription Yes   Weight blue bands   Reps 10-12     Interval Training   Interval Training No     Bike   Level 0.5   Minutes 17     NuStep   Level 2   Minutes 17   METs 2     Goals Met:  Exercise tolerated well No report of cardiac concerns or symptoms Strength training completed today  Goals Unmet:  Not Applicable  Comments: Service time is from 10:30am to 12:20pm    Dr. Rush Farmer is Medical Director for Pulmonary Rehab at [facility].

## 2015-10-07 ENCOUNTER — Encounter (HOSPITAL_COMMUNITY)
Admission: RE | Admit: 2015-10-07 | Discharge: 2015-10-07 | Disposition: A | Discharge status: Home / Self Care | Payer: Medicare Other | Source: Ambulatory Visit | Attending: Internal Medicine | Admitting: Internal Medicine

## 2015-10-07 VITALS — Wt 213.2 lb

## 2015-10-07 DIAGNOSIS — J439 Emphysema, unspecified: Secondary | ICD-10-CM | POA: Insufficient documentation

## 2015-10-07 NOTE — Telephone Encounter (Signed)
Spoke with patient's wife.  Confirmed [employer] monitor SN.  Patient has monitor plugged into his internet modem with the ethernet attachment.  Patient's wife says that he sends transmissions multiple times a week and gets a check mark indicating that the transmission was successful.  Right now the screen shows that the monitor is transmitting to our office, so she is unable to see the last successful transmission date.  Patient is at away at pulmonary rehab right now.  Advised patient's wife that I will contact tech services.  In the interim, requested that patient bring his home monitor to pulmonary rehab on Thursday to send a manual transmission.  Someone from our office will be in contact with her later this week regarding the results of the manual transmission.  She is agreeable to this plan and denies additional questions or concerns at this time.

## 2015-10-07 NOTE — Progress Notes (Signed)
Daily Session Note  Patient Details  Name: [name] [name] MRN: [id number] Date of Birth: Sep 17, 1940 Referring Provider:   April Manson Pulmonary Rehab Walk Test from 09/25/2015 in [place]  Referring Provider  Dr. Chase Caller      Encounter Date: 10/07/2015  Check In:     Session Check In - 10/07/15 1029      Check-In   Location [facility]   Staff Present Su Hilt, MS, ACSM RCEP, Exercise Physiologist;Akirah Storck Leonia Reeves, RN, BSN;Lisa Hughes, RN;Portia Rollene Rotunda, RN, BSN   Supervising physician immediately available to respond to emergencies [facility] immediately available   Physician(s) DR. Maylene Roes   Medication changes reported     No   Fall or balance concerns reported    No   Warm-up and Cool-down Performed as group-led Location manager Performed Yes   VAD Patient? No     Pain Assessment   Currently in Pain? No/denies   Multiple Pain Sites No      Capillary Blood Glucose: No results found for this or any previous visit (from the past 24 hour(s)).      Exercise Prescription Changes - 10/07/15 1200      Response to Exercise   Blood Pressure (Admit) 106/60   Blood Pressure (Exercise) 132/80   Blood Pressure (Exit) 110/72   Heart Rate (Admit) 84 bpm   Heart Rate (Exercise) 87 bpm   Heart Rate (Exit) 69 bpm   Oxygen Saturation (Admit) 94 %   Oxygen Saturation (Exercise) 93 %   Oxygen Saturation (Exit) 95 %   Rating of Perceived Exertion (Exercise) 13   Perceived Dyspnea (Exercise) 2   Duration Progress to 45 minutes of aerobic exercise without signs/symptoms of physical distress   Intensity THRR unchanged     Progression   Progression Continue progressive overload as per policy without signs/symptoms or physical distress.     Resistance Training   Training Prescription Yes   Weight blue bands   Reps 10-12  10 minutesof strength training     Interval Training   Interval Training No     Bike   Level 0.5   Minutes 17     NuStep   Level 2   Minutes 17   METs 1.9     Track   Laps 9   Minutes 17     Goals Met:  Exercise tolerated well Strength training completed today  Goals Unmet:  Not Applicable  Comments: Service time is from 1030 to 1210    Dr. Rush Farmer is Medical Director for Pulmonary Rehab at [facility].

## 2015-10-08 NOTE — Telephone Encounter (Signed)
LMOM advising that [employer] tech services should assist them with troubleshooting patient's home monitor.  Per tech services rep, Wire[employer] ethernet adapter may not be set up correctly.  Gave tech services phone number for further assistance and the [facility] phone number if any further questions.

## 2015-10-09 ENCOUNTER — Encounter (HOSPITAL_COMMUNITY)
Admission: RE | Admit: 2015-10-09 | Discharge: 2015-10-09 | Disposition: A | Discharge status: Home / Self Care | Payer: Medicare Other | Source: Ambulatory Visit | Attending: Internal Medicine | Admitting: Internal Medicine

## 2015-10-09 VITALS — Wt 211.0 lb

## 2015-10-09 DIAGNOSIS — J439 Emphysema, unspecified: Secondary | ICD-10-CM | POA: Diagnosis not present

## 2015-10-09 NOTE — Progress Notes (Signed)
Daily Session Note  Patient Details  Name: [name] [name] MRN: [id number] Date of Birth: 1940/02/12 Referring Provider:   April Manson Pulmonary Rehab Walk Test from 09/25/2015 in [place]  Referring Provider  Dr. Chase Caller      Encounter Date: 10/09/2015  Check In:     Session Check In - 10/09/15 1029      Check-In   Location [facility]   Staff Present Su Hilt, MS, ACSM RCEP, Exercise Physiologist;Joan Leonia Reeves, RN, BSN;Lisa Hughes, RN;Portia Rollene Rotunda, RN, BSN   Supervising physician immediately available to respond to emergencies [facility] immediately available   Physician(s) Dr. [place] Pink   Medication changes reported     No   Fall or balance concerns reported    No   Warm-up and Cool-down Performed as group-led instruction   Resistance Training Performed Yes   VAD Patient? No     Pain Assessment   Currently in Pain? No/denies   Multiple Pain Sites No      Capillary Blood Glucose: No results found for this or any previous visit (from the past 24 hour(s)).      Exercise Prescription Changes - 10/09/15 1200      Response to Exercise   Blood Pressure (Admit) 118/70   Blood Pressure (Exercise) 138/78   Blood Pressure (Exit) 116/60   Heart Rate (Admit) 70 bpm   Heart Rate (Exercise) 92 bpm   Heart Rate (Exit) 76 bpm   Oxygen Saturation (Admit) 97 %   Oxygen Saturation (Exercise) 97 %   Oxygen Saturation (Exit) 96 %   Rating of Perceived Exertion (Exercise) 11   Perceived Dyspnea (Exercise) 0   Duration Progress to 45 minutes of aerobic exercise without signs/symptoms of physical distress   Intensity THRR unchanged     Progression   Progression Continue progressive overload as per policy without signs/symptoms or physical distress.     Resistance Training   Training Prescription Yes   Weight blue bands   Reps 10-12  10 minutesof strength training     Interval Training   Interval Training  No     Bike   Level 0.5   Minutes 17     Track   Laps 14   Minutes 17     Goals Met:  Exercise tolerated well No report of cardiac concerns or symptoms Strength training completed today  Goals Unmet:  Not Applicable  Comments: Service time is from 10:30am to 12:20pm    Dr. Rush Farmer is Medical Director for Pulmonary Rehab at [facility].

## 2015-10-13 ENCOUNTER — Encounter: Payer: Self-pay | Admitting: Family Medicine

## 2015-10-13 DIAGNOSIS — H2513 Age-related nuclear cataract, bilateral: Secondary | ICD-10-CM | POA: Diagnosis not present

## 2015-10-13 DIAGNOSIS — H25013 Cortical age-related cataract, bilateral: Secondary | ICD-10-CM | POA: Diagnosis not present

## 2015-10-13 DIAGNOSIS — H04123 Dry eye syndrome of bilateral lacrimal glands: Secondary | ICD-10-CM | POA: Diagnosis not present

## 2015-10-13 DIAGNOSIS — H40013 Open angle with borderline findings, low risk, bilateral: Secondary | ICD-10-CM | POA: Diagnosis not present

## 2015-10-14 ENCOUNTER — Telehealth: Payer: Self-pay | Admitting: Family Medicine

## 2015-10-14 ENCOUNTER — Ambulatory Visit (INDEPENDENT_AMBULATORY_CARE_PROVIDER_SITE_OTHER): Payer: Medicare Other | Admitting: *Deleted

## 2015-10-14 ENCOUNTER — Encounter (HOSPITAL_COMMUNITY)
Admission: RE | Admit: 2015-10-14 | Discharge: 2015-10-14 | Disposition: A | Discharge status: Home / Self Care | Payer: Medicare Other | Source: Ambulatory Visit | Attending: Internal Medicine | Admitting: Internal Medicine

## 2015-10-14 VITALS — Wt 217.2 lb

## 2015-10-14 DIAGNOSIS — J439 Emphysema, unspecified: Secondary | ICD-10-CM

## 2015-10-14 DIAGNOSIS — Z95818 Presence of other cardiac implants and grafts: Secondary | ICD-10-CM

## 2015-10-14 MED ORDER — ARIPIPRAZOLE 5 MG PO TABS: 5.0000 mg | ORAL_TABLET | Freq: Every day | ORAL | 2 refills | Status: DC

## 2015-10-14 NOTE — Telephone Encounter (Signed)
Medication called/sent to requested pharmacy  - Last phone note she requested CA.

## 2015-10-14 NOTE — Telephone Encounter (Signed)
Pt came into the office today Tuesday 10-14-15 to have loop recorder checked.

## 2015-10-14 NOTE — Telephone Encounter (Signed)
Patient needs a refill for his abilify 5 mg called into [facility] on [address] not [address]. Wife isn't sure how long maybe 4-5 days he hasn't had this medication.  CB# [phone]

## 2015-10-14 NOTE — Progress Notes (Signed)
Pulmonary Individual Treatment Plan  Patient Details  Name: [name] [name] MRN: [id number] Date of Birth: Apr 14, 1940 Referring Provider:   April Manson Pulmonary Rehab Walk Test from 09/25/2015 in [place]  Referring Provider  Dr. Chase Caller      Initial Encounter Date:  Flowsheet Row Pulmonary Rehab Walk Test from 09/25/2015 in [place]  Date  09/25/15  Referring Provider  Dr. Chase Caller      Visit Diagnosis: Pulmonary emphysema, unspecified emphysema type ([place])  Patient's Home Medications on Admission:   Current Outpatient Prescriptions:  .  acetaminophen (TYLENOL) 325 MG tablet, Take 650 mg by mouth every 6 (six) hours as needed., Disp: , Rfl:  .  amantadine (SYMMETREL) 100 MG capsule, TAKE 1 CAPSULE BY MOUTH TWICE DAILY WITH BREAKFAST AND LUNCH, Disp: 180 capsule, Rfl: 0 .  amLODipine (NORVASC) 10 MG tablet, Take 1 tablet (10 mg total) by mouth daily., Disp: 30 tablet, Rfl: 3 .  ARIPiprazole (ABILIFY) 5 MG tablet, TAKE 1 TABLET DAILY, Disp: 90 tablet, Rfl: 2 .  atorvastatin (LIPITOR) 20 MG tablet, TAKE 1 TABLET DAILY, Disp: 90 tablet, Rfl: 3 .  bisoprolol (ZEBETA) 5 MG tablet, Take 1 tablet (5 mg total) by mouth daily., Disp: , Rfl:  .  cholecalciferol (VITAMIN D) 400 units TABS tablet, Take 400 Units by mouth., Disp: , Rfl:  .  clopidogrel (PLAVIX) 75 MG tablet, Take 1 tablet (75 mg total) by mouth daily., Disp: 90 tablet, Rfl: 2 .  Cyanocobalamin (VITAMIN B-12 CR) 1000 MCG TBCR, Take by mouth., Disp: , Rfl:  .  furosemide (LASIX) 40 MG tablet, Take 1 tablet (40 mg total) by mouth 2 (two) times daily. Please keep upcoming appointment for further refills, Disp: 60 tablet, Rfl: 1 .  HYDROcodone-acetaminophen (NORCO) 10-325 MG tablet, Take 1 tablet by mouth every 6 (six) hours as needed for moderate pain or severe pain., Disp: 100 tablet, Rfl: 0 .  losartan (COZAAR) 50 MG tablet, Take 50 mg by mouth daily., Disp: ,  Rfl:  .  PROAIR HFA 108 (90 BASE) MCG/ACT inhaler, USE 1 TO 2 INHALATIONS EVERY 6 HOURS AS NEEDED FOR WHEEZING OR SHORTNESS OF BREATH, Disp: 25.5 g, Rfl: 4 .  rOPINIRole (REQUIP) 1 MG tablet, Take 1 mg by mouth at bedtime. , Disp: , Rfl:  .  SPIRIVA HANDIHALER 18 MCG inhalation capsule, INHALE THE CONTENTS OF 1 CAPSULE WITH 2         INHALATIONS ONCE DAILY AS DIRECTED, Disp: 1 capsule, Rfl: 11 .  traMADol (ULTRAM) 50 MG tablet, Take 1 tablet (50 mg total) by mouth 3 (three) times daily., Disp: 90 tablet, Rfl: 0 .  venlafaxine XR (EFFEXOR-XR) 150 MG 24 hr capsule, TAKE 1 CAPSULE TWICE A DAY, Disp: 180 capsule, Rfl: 3  Past Medical History: Past Medical History:  Diagnosis Date  . Arthritis    s/p TKR  . Bladder cancer (HCC)    [facility], [facility] low grade papillary urothileal carcinoma  . BPH (benign prostatic hyperplasia)   . Brugada syndrome    Possible Type II Brugada ECG pattern. No family history of SCD, no syncope, no tachypalpitations.  [place] CAD (coronary artery disease)    a. LHC 1/15 - mid LCx 50 and 60%, proximal RCA 50%  . Chronic diastolic CHF (congestive heart failure) ([place])   . CKD (chronic kidney disease)   . COPD (chronic obstructive pulmonary disease) ([place])    Prior heavy smoker. PFTs (3/11): FVC 87%, FEV1  73%, ratio 0.57, DLCO 75%, TLC 121%. Moderate obstructive defect. PFTs (9/15): Only minimal obstruction, sugPrior heavy smoker. PFTs (3/11): FVC 87%, FEV1 73%, ratio 0.57, DLCO 75%, TLC 121%. Moderate obstructive defect. PFTs (9/15) minimal obstruction, poss asthma component   . Depression    with bipolar tendencies  . DOE (dyspnea on exertion)    a. Myoview 8/09- EF 57%, no ischemia // b. Myoview (3/11) EF 68%, diaphragmatic attenuation, no ischemia //  c Echo (4/11) EF 0000000, mild diastolic dysfunction, PASP 38 mmHg // d. PFTs 9/15 minimal obstruction  /  e. CT negative for ILD  . GERD (gastroesophageal reflux disease)   . History of Doppler ultrasound    Carotid US 3/11 negative  for significant stenosis  //  carotid US 6/17: Mild bilateral plaque, 1-39% ICA  . History of echocardiogram    a. Echo 12/14 Inferior and distal septal HK, mild LVH, EF 50-55%, mild MR, mild LAE  //  b. Echo 6/17: Mild focal basal septal hypertrophy, EF 60-65%, normal wall motion, grade 1 diastolic dysfunction, mild AI  . HLD (hyperlipidemia)   . HTN (hypertension)   . Low testosterone   . Lung nodule    a. CT in 2015 >> PET in 12/15 not sugg of malignancy   . LVH (left ventricular hypertrophy)    a. Echo 12/14 Inferior and distal septal HK, mild LVH, EF 50-55%, mild MR, mild LAE  . Mild dementia   . Mitral regurgitation    Echo (4/11) with PISA ERO 0.3 cm^2 and regurgitant volume 41 mL (moderate MR). Echo (12/14) with mild MR.   . MS (multiple sclerosis) ([place])   . Right bundle branch block   . Stroke ([place])   . Thoracic aortic aneurysm (HCC)    4.1 cm 2017  . Urinary retention with incomplete bladder emptying    receives botox injections and treatment for BPH through [facility]    Tobacco Use: History  Smoking Status  . Former Smoker  . Packs/day: 2.00  . Years: 40.00  . Types: Cigarettes  . Quit date: 01/04/1993  Smokeless Tobacco  . Former Systems developer  . Types: Chew    Comment: patient still smokes a pipe daily    Labs: Recent Review Flowsheet Data    Labs for ITP Cardiac and Pulmonary Rehab Latest Ref Rng & Units 03/31/2015 06/16/2015 06/20/2015 06/21/2015 06/26/2015   Cholestrol 0 - 200 mg/dL 158 185 - 140 -   LDLCALC 0 - 99 mg/dL 76 91 - 59 -   LDLDIRECT mg/dL - - - - -   HDL >40 mg/dL 48 43 - 33(L) -   Trlycerides <150 mg/dL 170(H) 256(H) - 239(H) -   Hemoglobin A1c <5.7 % 6.4(H) 6.4(H) - - 6.3(H)   TCO2 0 - 100 mmol/L - - 27 - -      Capillary Blood Glucose: Lab Results  Component Value Date   GLUCAP 80 06/20/2015   GLUCAP 123 (H) 06/15/2015   GLUCAP 94 12/11/2013     ADL UCSD:     Pulmonary Assessment Scores    Row Name 09/23/15 1621         ADL UCSD   ADL  Phase Entry     SOB Score total 15        Pulmonary Function Assessment:     Pulmonary Function Assessment - 09/19/15 1022      Breath   Bilateral Breath Sounds Clear   Shortness of Breath Yes;Limiting activity  Exercise Target Goals:    Exercise Program Goal: Individual exercise prescription set with THRR, safety & activity barriers. Participant demonstrates ability to understand and report RPE using BORG scale, to self-measure pulse accurately, and to acknowledge the importance of the exercise prescription.  Exercise Prescription Goal: Starting with aerobic activity 30 plus minutes a day, 3 days per week for initial exercise prescription. Provide home exercise prescription and guidelines that participant acknowledges understanding prior to discharge.  Activity Barriers & Risk Stratification:     Activity Barriers & Cardiac Risk Stratification - 09/19/15 1021      Activity Barriers & Cardiac Risk Stratification   Activity Barriers Shortness of Breath;Left Knee Replacement;Right Knee Replacement;Arthritis;Joint Problems;Muscular Weakness      6 Minute Walk:     6 Minute Walk    Row Name 09/25/15 1624         6 Minute Walk   Phase Initial     Distance 1100 feet     Walk Time 6 minutes     # of Rest Breaks 0     MPH 2.08     METS 2.53     RPE 12     Perceived Dyspnea  2     Symptoms Yes (comment)     Comments back ache     Resting HR 70 bpm     Resting BP 124/64     Max Ex. HR 97 bpm     Max Ex. BP 140/68       Interval HR   Baseline HR 70     1 Minute HR 80     2 Minute HR 91     3 Minute HR 97     4 Minute HR 87     5 Minute HR 96     6 Minute HR 96     2 Minute Post HR 74     Interval Heart Rate? Yes       Interval Oxygen   Interval Oxygen? Yes     Baseline Oxygen Saturation % 93 %     Baseline Liters of Oxygen 0 L     1 Minute Oxygen Saturation % 91 %     1 Minute Liters of Oxygen 0 L     2 Minute Oxygen Saturation % 92 %     2 Minute  Liters of Oxygen 0 L     3 Minute Oxygen Saturation % 92 %     3 Minute Liters of Oxygen 0 L     4 Minute Oxygen Saturation % 93 %     4 Minute Liters of Oxygen 0 L     5 Minute Oxygen Saturation % 93 %     5 Minute Liters of Oxygen 0 L     6 Minute Oxygen Saturation % 93 %     6 Minute Liters of Oxygen 0 L     2 Minute Post Oxygen Saturation % 96 %     2 Minute Post Liters of Oxygen 0 L        Initial Exercise Prescription:     Initial Exercise Prescription - 09/25/15 1600      Date of Initial Exercise RX and Referring Provider   Date 09/25/15   Referring Provider Dr. Chase Caller     Bike   Level 0.5   Minutes 17     NuStep   Level 2   Minutes 17   METs 1.5     Track  Laps 6   Minutes 17     Prescription Details   Frequency (times per week) 2   Duration Progress to 45 minutes of aerobic exercise without signs/symptoms of physical distress     Intensity   THRR 40-80% of Max Heartrate 59-118   Ratings of Perceived Exertion 11-13   Perceived Dyspnea 0-4     Progression   Progression Continue progressive overload as per policy without signs/symptoms or physical distress.     Resistance Training   Training Prescription Yes   Weight blue bands   Reps 10-12      Perform Capillary Blood Glucose checks as needed.  Exercise Prescription Changes:     Exercise Prescription Changes    Row Name 10/02/15 1200 10/07/15 1200 10/09/15 1200         Response to Exercise   Blood Pressure (Admit) 120/60 106/60 118/70     Blood Pressure (Exercise) 150/70 132/80 138/78     Blood Pressure (Exit) 134/82 110/72 116/60     Heart Rate (Admit) 81 bpm 84 bpm 70 bpm     Heart Rate (Exercise) 95 bpm 87 bpm 92 bpm     Heart Rate (Exit) 87 bpm 69 bpm 76 bpm     Oxygen Saturation (Admit) 96 % 94 % 97 %     Oxygen Saturation (Exercise) 97 % 93 % 97 %     Oxygen Saturation (Exit) 97 % 95 % 96 %     Rating of Perceived Exertion (Exercise) 11 13 11      Perceived Dyspnea (Exercise)  0 2 0     Duration Progress to 45 minutes of aerobic exercise without signs/symptoms of physical distress Progress to 45 minutes of aerobic exercise without signs/symptoms of physical distress Progress to 45 minutes of aerobic exercise without signs/symptoms of physical distress     Intensity THRR unchanged THRR unchanged THRR unchanged       Progression   Progression Continue progressive overload as per policy without signs/symptoms or physical distress. Continue progressive overload as per policy without signs/symptoms or physical distress. Continue progressive overload as per policy without signs/symptoms or physical distress.       Resistance Training   Training Prescription Yes Yes Yes     Weight blue bands blue bands blue bands     Reps 10-12 10-12  10 minutesof strength training 10-12  10 minutesof strength training       Interval Training   Interval Training No No No       Bike   Level 0.5 0.5 0.5     Minutes 17 17 17        NuStep   Level 2 2  -     Minutes 17 17  -     METs 2 1.9  -       Track   Laps  - 9 14     Minutes  - 17 17        Exercise Comments:     Exercise Comments    Row Name 10/13/15 1019           Exercise Comments Has only attended three sessions. Too soon to monitor progression.           Discharge Exercise Prescription (Final Exercise Prescription Changes):     Exercise Prescription Changes - 10/09/15 1200      Response to Exercise   Blood Pressure (Admit) 118/70   Blood Pressure (Exercise) 138/78   Blood Pressure (Exit) 116/60  Heart Rate (Admit) 70 bpm   Heart Rate (Exercise) 92 bpm   Heart Rate (Exit) 76 bpm   Oxygen Saturation (Admit) 97 %   Oxygen Saturation (Exercise) 97 %   Oxygen Saturation (Exit) 96 %   Rating of Perceived Exertion (Exercise) 11   Perceived Dyspnea (Exercise) 0   Duration Progress to 45 minutes of aerobic exercise without signs/symptoms of physical distress   Intensity THRR unchanged     Progression    Progression Continue progressive overload as per policy without signs/symptoms or physical distress.     Resistance Training   Training Prescription Yes   Weight blue bands   Reps 10-12  10 minutesof strength training     Interval Training   Interval Training No     Bike   Level 0.5   Minutes 17     Track   Laps 14   Minutes 17       Nutrition:  Target Goals: Understanding of nutrition guidelines, daily intake of sodium 1500mg , cholesterol 200mg , calories 30% from fat and 7% or less from saturated fats, daily to have 5 or more servings of fruits and vegetables.  Biometrics:     Pre Biometrics - 09/19/15 1056      Pre Biometrics   Grip Strength 35 kg       Nutrition Therapy Plan and Nutrition Goals:   Nutrition Discharge: Rate Your Plate Scores:   Psychosocial: Target Goals: Acknowledge presence or absence of depression, maximize coping skills, provide positive support system. Participant is able to verbalize types and ability to use techniques and skills needed for reducing stress and depression.  Initial Review & Psychosocial Screening:     Initial Psych Review & Screening - 10/14/15 0712      Barriers   Psychosocial barriers to participate in program There are no identifiable barriers or psychosocial needs.      Quality of Life Scores:     Quality of Life - 09/23/15 1622      Quality of Life Scores   Health/Function Pre 14.73 %   Socioeconomic Pre 21.07 %   Psych/Spiritual Pre 15.36 %   Family Pre 3 %   GLOBAL Pre 14.79 %      PHQ-9: Recent Review Flowsheet Data    Depression screen [facility] 2/9 09/19/2015 07/04/2015 07/02/2015   Decreased Interest 0 3 0   Down, Depressed, Hopeless 0 3 0   PHQ - 2 Score 0 6 0   Altered sleeping - 2 -   Tired, decreased energy - 3 -   Change in appetite - 3 -   Feeling bad or failure about yourself  - 3 -   Trouble concentrating - 2 -   Moving slowly or fidgety/restless - 0 -   Suicidal thoughts - 0 -    PHQ-9 Score - 19 -   Difficult doing work/chores - Extremely dIfficult -      Psychosocial Evaluation and Intervention:     Psychosocial Evaluation - 10/14/15 0713      Psychosocial Evaluation & Interventions   Interventions Encouraged to exercise with the program and follow exercise prescription   Comments Initial review identified potential psychosocial barriers however patient has attended all exercise and education sessions since admission      Psychosocial Re-Evaluation:     Psychosocial Re-Evaluation    [place] Name 10/14/15 0714             Psychosocial Re-Evaluation   Interventions Encouraged to attend Pulmonary Rehabilitation for  the exercise       Comments no psychosocial barriers to participation identified at this time         Education: Education Goals: Education classes will be provided on a weekly basis, covering required topics. Participant will state understanding/return demonstration of topics presented.  Learning Barriers/Preferences:     Learning Barriers/Preferences - 09/19/15 1022      Learning Barriers/Preferences   Learning Barriers None   Learning Preferences Computer/Internet;Written Material;Skilled Demonstration;Individual Instruction;Group Instruction      Education Topics: Risk Factor Reduction:  -Group instruction that is supported by a PowerPoint presentation. Instructor discusses the definition of a risk factor, different risk factors for pulmonary disease, and how the heart and lungs work together.     Nutrition for Pulmonary Patient:  -Group instruction provided by PowerPoint slides, verbal discussion, and written materials to support subject matter. The instructor gives an explanation and review of healthy diet recommendations, which includes a discussion on weight management, recommendations for fruit and vegetable consumption, as well as protein, fluid, caffeine, fiber, sodium, sugar, and alcohol. Tips for eating when patients are  short of breath are discussed.   Pursed Lip Breathing:  -Group instruction that is supported by demonstration and informational handouts. Instructor discusses the benefits of pursed lip and diaphragmatic breathing and detailed demonstration on how to preform both.     Oxygen Safety:  -Group instruction provided by PowerPoint, verbal discussion, and written material to support subject matter. There is an overview of "What is Oxygen" and "Why do we need it".  Instructor also reviews how to create a safe environment for oxygen use, the importance of using oxygen as prescribed, and the risks of noncompliance. There is a brief discussion on traveling with oxygen and resources the patient may utilize.   Oxygen Equipment:  -Group instruction provided by [facility] Staff utilizing handouts, written materials, and equipment demonstrations.   Signs and Symptoms:  -Group instruction provided by written material and verbal discussion to support subject matter. Warning signs and symptoms of infection, stroke, and heart attack are reviewed and when to call the physician/911 reinforced. Tips for preventing the spread of infection discussed. Flowsheet Row PULMONARY REHAB OTHER RESPIRATORY from 10/09/2015 in [place]  Date  10/02/15  Educator  RN  Instruction Review Code  2- meets goals/outcomes      Advanced Directives:  -Group instruction provided by verbal instruction and written material to support subject matter. Instructor reviews Advanced Directive laws and proper instruction for filling out document.   Pulmonary Video:  -Group video education that reviews the importance of medication and oxygen compliance, exercise, good nutrition, pulmonary hygiene, and pursed lip and diaphragmatic breathing for the pulmonary patient.   Exercise for the Pulmonary Patient:  -Group instruction that is supported by a PowerPoint presentation. Instructor discusses benefits of  exercise, core components of exercise, frequency, duration, and intensity of an exercise routine, importance of utilizing pulse oximetry during exercise, safety while exercising, and options of places to exercise outside of rehab.     Pulmonary Medications:  -Verbally interactive group education provided by instructor with focus on inhaled medications and proper administration.   Anatomy and Physiology of the Respiratory System and Intimacy:  -Group instruction provided by PowerPoint, verbal discussion, and written material to support subject matter. Instructor reviews respiratory cycle and anatomical components of the respiratory system and their functions. Instructor also reviews differences in obstructive and restrictive respiratory diseases with examples of each. Intimacy, Sex, and Sexuality differences  are reviewed with a discussion on how relationships can change when diagnosed with pulmonary disease. Common sexual concerns are reviewed.   Knowledge Questionnaire Score:     Knowledge Questionnaire Score - 09/23/15 1620      Knowledge Questionnaire Score   Pre Score 6/13      Core Components/Risk Factors/Patient Goals at Admission:     Personal Goals and Risk Factors at Admission - 09/19/15 1023      Core Components/Risk Factors/Patient Goals on Admission    Weight Management Yes;Obesity;Weight Loss   Intervention Weight Management/Obesity: Establish reasonable short term and long term weight goals.;Obesity: Provide education and appropriate resources to help participant work on and attain dietary goals.   Expected Outcomes Short Term: Continue to assess and modify interventions until short term weight is achieved;Long Term: Adherence to nutrition and physical activity/exercise program aimed toward attainment of established weight goal;Weight Loss: Understanding of general recommendations for a balanced deficit meal plan, which promotes 1-2 lb weight loss per week and includes a  negative energy balance of [phone] kcal/d;Understanding recommendations for meals to include 15-35% energy as protein, 25-35% energy from fat, 35-60% energy from carbohydrates, less than 200mg  of dietary cholesterol, 20-35 gm of total fiber daily;Understanding of distribution of calorie intake throughout the day with the consumption of 4-5 meals/snacks   Sedentary Yes   Intervention Provide advice, education, support and counseling about physical activity/exercise needs.;Develop an individualized exercise prescription for aerobic and resistive training based on initial evaluation findings, risk stratification, comorbidities and participant's personal goals.   Expected Outcomes Achievement of increased cardiorespiratory fitness and enhanced flexibility, muscular endurance and strength shown through measurements of functional capacity and personal statement of participant.   Increase Strength and Stamina Yes   Intervention Provide advice, education, support and counseling about physical activity/exercise needs.;Develop an individualized exercise prescription for aerobic and resistive training based on initial evaluation findings, risk stratification, comorbidities and participant's personal goals.   Expected Outcomes Achievement of increased cardiorespiratory fitness and enhanced flexibility, muscular endurance and strength shown through measurements of functional capacity and personal statement of participant.   Tobacco Cessation Yes   Intervention Assist the participant in steps to quit. Provide individualized education and counseling about committing to Tobacco Cessation, relapse prevention, and pharmacological support that can be provided by physician.;Advice worker, assist with locating and accessing local/national Quit Smoking programs, and support quit date choice.   Expected Outcomes Short Term: Will demonstrate readiness to quit, by selecting a quit date.;Long Term: Complete  abstinence from all tobacco products for at least 12 months from quit date.;Short Term: Will quit all tobacco product use, adhering to prevention of relapse plan.   Improve shortness of breath with ADL's Yes   Intervention Provide education, individualized exercise plan and daily activity instruction to help decrease symptoms of SOB with activities of daily living.   Expected Outcomes Short Term: Achieves a reduction of symptoms when performing activities of daily living.   Develop more efficient breathing techniques such as purse lipped breathing and diaphragmatic breathing; and practicing self-pacing with activity Yes   Intervention Provide education, demonstration and support about specific breathing techniuqes utilized for more efficient breathing. Include techniques such as pursed lipped breathing, diaphragmatic breathing and self-pacing activity.   Expected Outcomes Short Term: Participant will be able to demonstrate and use breathing techniques as needed throughout daily activities.      Core Components/Risk Factors/Patient Goals Review:      Goals and Risk Factor Review    Row Name 10/14/15  0706             Core Components/Risk Factors/Patient Goals Review   Personal Goals Review Increase Strength and Stamina;Sedentary;Weight Management/Obesity;Tobacco Cessation;Improve shortness of breath with ADL's;Develop more efficient breathing techniques such as purse lipped breathing and diaphragmatic breathing and practicing self-pacing with activity.       Review see "comments" section on ITP       Expected Outcomes See "Admission" expected outcomes          Core Components/Risk Factors/Patient Goals at Discharge (Final Review):      Goals and Risk Factor Review - 10/14/15 0706      Core Components/Risk Factors/Patient Goals Review   Personal Goals Review Increase Strength and Stamina;Sedentary;Weight Management/Obesity;Tobacco Cessation;Improve shortness of breath with ADL's;Develop  more efficient breathing techniques such as purse lipped breathing and diaphragmatic breathing and practicing self-pacing with activity.   Review see "comments" section on ITP   Expected Outcomes See "Admission" expected outcomes      ITP Comments:   Comments: ITP REVIEW Pt is making expected progress towards most pulmonary rehab goals after completing 3 sessions. He does continue to smoke and has not cut back at this time. Will continue to encourage and support patient with smoking cessation. Recommend continued exercise, life style modification, education, and utilization of breathing techniques to increase stamina and strength and decrease shortness of breath with exertion.

## 2015-10-14 NOTE — Progress Notes (Signed)
Loop check in clinic. Battery status: Good. R-waves 0.55mV. 0 symptom episodes, 0 tachy episodes, 0 pause episodes, 0 brady episodes. 0 AF episodes. Pt to call [employer] to troubleshoot home monitor. If unable to get home monitor to work, will discuss further with SK about how often to bring pt to clinic.

## 2015-10-14 NOTE — Telephone Encounter (Signed)
Patient needs abilify called into [facility] asap, wife says he has been out for three days and does not want him to start having effects from this  [phone]

## 2015-10-14 NOTE — Progress Notes (Signed)
Daily Session Note  Patient Details  Name: [name] [name] MRN: [id number] Date of Birth: 1940/02/23 Referring Provider:   April Manson Pulmonary Rehab Walk Test from 09/25/2015 in [place]  Referring Provider  Dr. Chase Caller      Encounter Date: 10/14/2015  Check In:     Session Check In - 10/14/15 1013      Check-In   Location [facility]   Staff Present Rosebud Poles, RN, BSN;Molly diVincenzo, MS, ACSM RCEP, Exercise Physiologist;Lasheena Frieze Ysidro Evert, RN;Portia Rollene Rotunda, RN, BSN   Supervising physician immediately available to respond to emergencies [facility] immediately available   Physician(s) Dr. Waldron Labs   Medication changes reported     No   Fall or balance concerns reported    No   Warm-up and Cool-down Performed as group-led instruction   Resistance Training Performed Yes   VAD Patient? No     Pain Assessment   Currently in Pain? No/denies   Multiple Pain Sites No      Capillary Blood Glucose: No results found for this or any previous visit (from the past 24 hour(s)).      Exercise Prescription Changes - 10/14/15 1200      Exercise Review   Progression Yes     Response to Exercise   Blood Pressure (Admit) 120/60   Blood Pressure (Exercise) 130/70   Blood Pressure (Exit) 124/64   Heart Rate (Admit) 77 bpm   Heart Rate (Exercise) 90 bpm   Heart Rate (Exit) 72 bpm   Oxygen Saturation (Admit) 97 %   Oxygen Saturation (Exercise) 96 %   Oxygen Saturation (Exit) 97 %   Rating of Perceived Exertion (Exercise) 13   Perceived Dyspnea (Exercise) 1   Duration Progress to 45 minutes of aerobic exercise without signs/symptoms of physical distress   Intensity THRR unchanged     Progression   Progression Continue to progress workloads to maintain intensity without signs/symptoms of physical distress.     Resistance Training   Training Prescription Yes   Weight blue bands   Reps 10-12  10 minutes of strength  training     Interval Training   Interval Training No     Bike   Level 0.5   Minutes 17     NuStep   Level 3   Minutes 17   METs 1.9     Track   Laps 16   Minutes 17     Goals Met:  Exercise tolerated well No report of cardiac concerns or symptoms Strength training completed today  Goals Unmet:  Not Applicable  Comments: Service time is from 1030 to 1200    Dr. Rush Farmer is Medical Director for Pulmonary Rehab at [facility].

## 2015-10-14 NOTE — Telephone Encounter (Signed)
Medication called/sent to requested pharmacy  

## 2015-10-16 ENCOUNTER — Encounter (HOSPITAL_COMMUNITY)
Admission: RE | Admit: 2015-10-16 | Discharge: 2015-10-16 | Disposition: A | Discharge status: Home / Self Care | Payer: Medicare Other | Source: Ambulatory Visit | Attending: Internal Medicine | Admitting: Internal Medicine

## 2015-10-16 VITALS — Wt 213.2 lb

## 2015-10-16 DIAGNOSIS — J439 Emphysema, unspecified: Secondary | ICD-10-CM

## 2015-10-16 NOTE — Progress Notes (Signed)
Daily Session Note  Patient Details  Name: [name] [name] MRN: [id number] Date of Birth: 06/14/1940 Referring Provider:   April Manson Pulmonary Rehab Walk Test from 09/25/2015 in [place]  Referring Provider  Dr. Chase Caller      Encounter Date: 10/16/2015  Check In:     Session Check In - 10/16/15 1218      Check-In   Location [facility]   Staff Present Rosebud Poles, RN, BSN;Caulder Wehner, MS, ACSM RCEP, Exercise Physiologist;Lisa Ysidro Evert, RN;Portia Rollene Rotunda, RN, BSN   Supervising physician immediately available to respond to emergencies [facility] immediately available   Physician(s) Dr. Alfredia Ferguson   Medication changes reported     No   Fall or balance concerns reported    No   Warm-up and Cool-down Performed as group-led instruction   Resistance Training Performed Yes   VAD Patient? No     Pain Assessment   Currently in Pain? No/denies   Multiple Pain Sites No      Capillary Blood Glucose: No results found for this or any previous visit (from the past 24 hour(s)).      Exercise Prescription Changes - 10/16/15 1200      Exercise Review   Progression Yes     Response to Exercise   Blood Pressure (Admit) 110/50   Blood Pressure (Exercise) 116/60   Blood Pressure (Exit) 104/60   Heart Rate (Admit) 65 bpm   Heart Rate (Exercise) 83 bpm   Heart Rate (Exit) 73 bpm   Oxygen Saturation (Admit) 97 %   Oxygen Saturation (Exercise) 98 %   Oxygen Saturation (Exit) 96 %   Rating of Perceived Exertion (Exercise) 13   Perceived Dyspnea (Exercise) 1   Duration Progress to 45 minutes of aerobic exercise without signs/symptoms of physical distress   Intensity THRR unchanged     Progression   Progression Continue to progress workloads to maintain intensity without signs/symptoms of physical distress.     Resistance Training   Training Prescription Yes   Weight blue bands   Reps 10-12  10 minutes of strength  training     Interval Training   Interval Training No     Bike   Level 0.5   Minutes 17     NuStep   Level 4   Minutes 17   METs 2.3     Goals Met:  Exercise tolerated well No report of cardiac concerns or symptoms Strength training completed today  Goals Unmet:  Not Applicable  Comments: Service time is from 10:30am to 12:10pm    Dr. Rush Farmer is Medical Director for Pulmonary Rehab at [facility].

## 2015-10-17 ENCOUNTER — Other Ambulatory Visit: Payer: Self-pay | Admitting: Family Medicine

## 2015-10-21 ENCOUNTER — Encounter (HOSPITAL_COMMUNITY)
Admission: RE | Admit: 2015-10-21 | Discharge: 2015-10-21 | Disposition: A | Discharge status: Home / Self Care | Payer: Medicare Other | Source: Ambulatory Visit | Attending: Internal Medicine | Admitting: Internal Medicine

## 2015-10-21 VITALS — Wt 214.9 lb

## 2015-10-21 DIAGNOSIS — J439 Emphysema, unspecified: Secondary | ICD-10-CM | POA: Diagnosis not present

## 2015-10-21 NOTE — Progress Notes (Signed)
Daily Session Note  Patient Details  Name: [name] [name] MRN: [id number] Date of Birth: August 10, 1940 Referring Provider:   April Manson Pulmonary Rehab Walk Test from 09/25/2015 in [place]  Referring Provider  Dr. Chase Caller      Encounter Date: 10/21/2015  Check In:     Session Check In - 10/21/15 1030      Check-In   Location [facility]   Staff Present Rosebud Poles, RN, BSN;Arne Schlender, MS, ACSM RCEP, Exercise Physiologist;Annedrea Rosezella [place], RN, MHA;Portia Rollene Rotunda, RN, BSN   Supervising physician immediately available to respond to emergencies [facility] immediately available   Physician(s) Dr. Alfredia Ferguson   Medication changes reported     No   Fall or balance concerns reported    No   Warm-up and Cool-down Performed as group-led instruction   Resistance Training Performed Yes   VAD Patient? No     Pain Assessment   Currently in Pain? No/denies   Multiple Pain Sites No      Capillary Blood Glucose: No results found for this or any previous visit (from the past 24 hour(s)).      Exercise Prescription Changes - 10/21/15 1200      Exercise Review   Progression Yes     Response to Exercise   Blood Pressure (Admit) 110/54   Blood Pressure (Exercise) 120/80   Blood Pressure (Exit) 130/62   Heart Rate (Admit) 65 bpm   Heart Rate (Exercise) 76 bpm   Heart Rate (Exit) 73 bpm   Oxygen Saturation (Admit) 97 %   Oxygen Saturation (Exercise) 95 %   Oxygen Saturation (Exit) 96 %   Rating of Perceived Exertion (Exercise) 11   Perceived Dyspnea (Exercise) 1   Duration Progress to 45 minutes of aerobic exercise without signs/symptoms of physical distress   Intensity THRR unchanged     Progression   Progression Continue to progress workloads to maintain intensity without signs/symptoms of physical distress.     Resistance Training   Training Prescription Yes   Weight blue bands   Reps 10-12  10 minutes of  strength training     Interval Training   Interval Training No     Bike   Level 0.8   Minutes 17     NuStep   Level 4   Minutes 17   METs 2.8     Track   Laps 19   Minutes 17     Goals Met:  Exercise tolerated well No report of cardiac concerns or symptoms Strength training completed today  Goals Unmet:  Not Applicable  Comments: Service time is from 10:30AM to 12:05PM    Dr. Rush Farmer is Medical Director for Pulmonary Rehab at [facility].

## 2015-10-22 ENCOUNTER — Encounter: Payer: Self-pay | Admitting: Family Medicine

## 2015-10-23 ENCOUNTER — Encounter (HOSPITAL_COMMUNITY): Payer: Medicare Other

## 2015-10-28 ENCOUNTER — Encounter (HOSPITAL_COMMUNITY): Payer: Medicare Other

## 2015-10-28 ENCOUNTER — Telehealth (HOSPITAL_COMMUNITY): Payer: Self-pay | Admitting: Family Medicine

## 2015-10-30 ENCOUNTER — Encounter (HOSPITAL_COMMUNITY)
Admission: RE | Admit: 2015-10-30 | Discharge: 2015-10-30 | Disposition: A | Discharge status: Home / Self Care | Payer: Medicare Other | Source: Ambulatory Visit | Attending: Internal Medicine | Admitting: Internal Medicine

## 2015-10-30 VITALS — Wt 220.0 lb

## 2015-10-30 DIAGNOSIS — J439 Emphysema, unspecified: Secondary | ICD-10-CM

## 2015-10-30 NOTE — Progress Notes (Signed)
Daily Session Note  Patient Details  Name: [name] [name] MRN: [id number] Date of Birth: [birth year] Referring Provider:   April Manson Pulmonary Rehab Walk Test from 09/25/2015 in [place]  Referring Provider  Dr. Chase Caller      Encounter Date: 10/30/2015  Check In:     Session Check In - 10/30/15 1014      Check-In   Location [facility]   Staff Present Rosebud Poles, RN, BSN;Lasaundra Riche, MS, ACSM RCEP, Exercise Physiologist;Lisa Ysidro Evert, RN;Portia Rollene Rotunda, RN, BSN   Supervising physician immediately available to respond to emergencies [facility] immediately available   Physician(s) Dr. Alfredia Ferguson   Medication changes reported     No   Fall or balance concerns reported    No   Warm-up and Cool-down Performed as group-led instruction   Resistance Training Performed Yes   VAD Patient? No     Pain Assessment   Currently in Pain? No/denies   Multiple Pain Sites No      Capillary Blood Glucose: No results found for this or any previous visit (from the past 24 hour(s)).      Exercise Prescription Changes - 10/30/15 1200      Response to Exercise   Blood Pressure (Admit) 128/64   Blood Pressure (Exercise) 144/66   Blood Pressure (Exit) 138/74   Heart Rate (Admit) 87 bpm   Heart Rate (Exercise) 97 bpm   Heart Rate (Exit) 78 bpm   Oxygen Saturation (Admit) 90 %   Oxygen Saturation (Exercise) 95 %   Oxygen Saturation (Exit) 95 %   Rating of Perceived Exertion (Exercise) 9   Perceived Dyspnea (Exercise) 0   Duration Progress to 45 minutes of aerobic exercise without signs/symptoms of physical distress   Intensity THRR unchanged     Progression   Progression Continue to progress workloads to maintain intensity without signs/symptoms of physical distress.     Resistance Training   Training Prescription Yes   Weight blue bands   Reps 10-12  10 minutes of strength training     Interval Training   Interval  Training No     Bike   Level 0.9   Minutes 17     NuStep   Level 4   Minutes 17   METs 2.3     Goals Met:  Exercise tolerated well No report of cardiac concerns or symptoms Strength training completed today  Goals Unmet:  Not Applicable  Comments: Service time is from 10:30am to 12:30pm. Patient attended education today with Dr. Nelda Marseille    Dr. Rush Farmer is Medical Director for Pulmonary Rehab at [facility].

## 2015-11-04 ENCOUNTER — Encounter (HOSPITAL_COMMUNITY): Payer: Medicare Other

## 2015-11-06 ENCOUNTER — Encounter (HOSPITAL_COMMUNITY): Admission: RE | Admit: 2015-11-06 | Payer: Medicare Other | Source: Ambulatory Visit

## 2015-11-11 ENCOUNTER — Encounter (HOSPITAL_COMMUNITY)
Admission: RE | Admit: 2015-11-11 | Discharge: 2015-11-11 | Disposition: A | Discharge status: Home / Self Care | Payer: Medicare Other | Source: Ambulatory Visit | Attending: Internal Medicine | Admitting: Internal Medicine

## 2015-11-11 DIAGNOSIS — N3281 Overactive bladder: Secondary | ICD-10-CM | POA: Diagnosis not present

## 2015-11-11 DIAGNOSIS — J439 Emphysema, unspecified: Secondary | ICD-10-CM | POA: Insufficient documentation

## 2015-11-11 DIAGNOSIS — N320 Bladder-neck obstruction: Secondary | ICD-10-CM | POA: Diagnosis not present

## 2015-11-11 DIAGNOSIS — N138 Other obstructive and reflux uropathy: Secondary | ICD-10-CM | POA: Diagnosis not present

## 2015-11-11 DIAGNOSIS — N319 Neuromuscular dysfunction of bladder, unspecified: Secondary | ICD-10-CM | POA: Diagnosis not present

## 2015-11-11 DIAGNOSIS — N401 Enlarged prostate with lower urinary tract symptoms: Secondary | ICD-10-CM | POA: Diagnosis not present

## 2015-11-11 DIAGNOSIS — R350 Frequency of micturition: Secondary | ICD-10-CM | POA: Diagnosis not present

## 2015-11-11 DIAGNOSIS — N3941 Urge incontinence: Secondary | ICD-10-CM | POA: Diagnosis not present

## 2015-11-11 NOTE — Progress Notes (Signed)
Pulmonary Individual Treatment Plan  Patient Details  Name: [name] [name] MRN: [id number] Date of Birth: Mar 12, 1940 Referring Provider:   April Manson Pulmonary Rehab Walk Test from 09/25/2015 in [place]  Referring Provider  Dr. Chase Caller      Initial Encounter Date:  Flowsheet Row Pulmonary Rehab Walk Test from 09/25/2015 in [place]  Date  09/25/15  Referring Provider  Dr. Chase Caller      Visit Diagnosis: Pulmonary emphysema, unspecified emphysema type ([place])  Patient's Home Medications on Admission:   Current Outpatient Prescriptions:  .  acetaminophen (TYLENOL) 325 MG tablet, Take 650 mg by mouth every 6 (six) hours as needed., Disp: , Rfl:  .  amantadine (SYMMETREL) 100 MG capsule, TAKE 1 CAPSULE BY MOUTH TWICE DAILY WITH BREAKFAST AND LUNCH, Disp: 180 capsule, Rfl: 0 .  amLODipine (NORVASC) 10 MG tablet, Take 1 tablet (10 mg total) by mouth daily., Disp: 30 tablet, Rfl: 3 .  ARIPiprazole (ABILIFY) 5 MG tablet, Take 1 tablet (5 mg total) by mouth daily., Disp: 90 tablet, Rfl: 2 .  atorvastatin (LIPITOR) 20 MG tablet, TAKE 1 TABLET DAILY, Disp: 90 tablet, Rfl: 3 .  bisoprolol (ZEBETA) 5 MG tablet, Take 1 tablet (5 mg total) by mouth daily., Disp: , Rfl:  .  cholecalciferol (VITAMIN D) 400 units TABS tablet, Take 400 Units by mouth., Disp: , Rfl:  .  clopidogrel (PLAVIX) 75 MG tablet, Take 1 tablet (75 mg total) by mouth daily., Disp: 90 tablet, Rfl: 2 .  Cyanocobalamin (VITAMIN B-12 CR) 1000 MCG TBCR, Take by mouth., Disp: , Rfl:  .  furosemide (LASIX) 40 MG tablet, Take 1 tablet (40 mg total) by mouth 2 (two) times daily. Please keep upcoming appointment for further refills, Disp: 60 tablet, Rfl: 1 .  HYDROcodone-acetaminophen (NORCO) 10-325 MG tablet, Take 1 tablet by mouth every 6 (six) hours as needed for moderate pain or severe pain., Disp: 100 tablet, Rfl: 0 .  losartan (COZAAR) 50 MG tablet, Take 50 mg  by mouth daily., Disp: , Rfl:  .  PROAIR HFA 108 (90 Base) MCG/ACT inhaler, USE 1 TO 2 INHALATIONS EVERY 6 HOURS AS NEEDED FOR WHEEZING OR SHORTNESS OF BREATH, Disp: 25.5 g, Rfl: 4 .  rOPINIRole (REQUIP) 1 MG tablet, Take 1 mg by mouth at bedtime. , Disp: , Rfl:  .  SPIRIVA HANDIHALER 18 MCG inhalation capsule, INHALE THE CONTENTS OF 1 CAPSULE WITH 2         INHALATIONS ONCE DAILY AS DIRECTED, Disp: 1 capsule, Rfl: 11 .  traMADol (ULTRAM) 50 MG tablet, Take 1 tablet (50 mg total) by mouth 3 (three) times daily., Disp: 90 tablet, Rfl: 0 .  venlafaxine XR (EFFEXOR-XR) 150 MG 24 hr capsule, TAKE 1 CAPSULE TWICE A DAY, Disp: 180 capsule, Rfl: 3  Past Medical History: Past Medical History:  Diagnosis Date  . Arthritis    s/p TKR  . Bladder cancer (HCC)    [facility], [facility] low grade papillary urothileal carcinoma  . BPH (benign prostatic hyperplasia)   . Brugada syndrome    Possible Type II Brugada ECG pattern. No family history of SCD, no syncope, no tachypalpitations.  [place] CAD (coronary artery disease)    a. LHC 1/15 - mid LCx 50 and 60%, proximal RCA 50%  . Chronic diastolic CHF (congestive heart failure) ([place])   . CKD (chronic kidney disease)   . COPD (chronic obstructive pulmonary disease) ([place])    Prior heavy smoker.  PFTs (3/11): FVC 87%, FEV1 73%, ratio 0.57, DLCO 75%, TLC 121%. Moderate obstructive defect. PFTs (9/15): Only minimal obstruction, sugPrior heavy smoker. PFTs (3/11): FVC 87%, FEV1 73%, ratio 0.57, DLCO 75%, TLC 121%. Moderate obstructive defect. PFTs (9/15) minimal obstruction, poss asthma component   . Depression    with bipolar tendencies  . DOE (dyspnea on exertion)    a. Myoview 8/09- EF 57%, no ischemia // b. Myoview (3/11) EF 68%, diaphragmatic attenuation, no ischemia //  c Echo (4/11) EF 0000000, mild diastolic dysfunction, PASP 38 mmHg // d. PFTs 9/15 minimal obstruction  /  e. CT negative for ILD  . GERD (gastroesophageal reflux disease)   . History of Doppler ultrasound     Carotid US 3/11 negative for significant stenosis  //  carotid US 6/17: Mild bilateral plaque, 1-39% ICA  . History of echocardiogram    a. Echo 12/14 Inferior and distal septal HK, mild LVH, EF 50-55%, mild MR, mild LAE  //  b. Echo 6/17: Mild focal basal septal hypertrophy, EF 60-65%, normal wall motion, grade 1 diastolic dysfunction, mild AI  . HLD (hyperlipidemia)   . HTN (hypertension)   . Low testosterone   . Lung nodule    a. CT in 2015 >> PET in 12/15 not sugg of malignancy   . LVH (left ventricular hypertrophy)    a. Echo 12/14 Inferior and distal septal HK, mild LVH, EF 50-55%, mild MR, mild LAE  . Mild dementia   . Mitral regurgitation    Echo (4/11) with PISA ERO 0.3 cm^2 and regurgitant volume 41 mL (moderate MR). Echo (12/14) with mild MR.   . MS (multiple sclerosis) ([place])   . Right bundle branch block   . Stroke ([place])   . Thoracic aortic aneurysm (HCC)    4.1 cm 2017  . Urinary retention with incomplete bladder emptying    receives botox injections and treatment for BPH through [facility]    Tobacco Use: History  Smoking Status  . Former Smoker  . Packs/day: 2.00  . Years: 40.00  . Types: Cigarettes  . Quit date: 01/04/1993  Smokeless Tobacco  . Former Systems developer  . Types: Chew    Comment: patient still smokes a pipe daily    Labs: Recent Review Flowsheet Data    Labs for ITP Cardiac and Pulmonary Rehab Latest Ref Rng & Units 03/31/2015 06/16/2015 06/20/2015 06/21/2015 06/26/2015   Cholestrol 0 - 200 mg/dL 158 185 - 140 -   LDLCALC 0 - 99 mg/dL 76 91 - 59 -   LDLDIRECT mg/dL - - - - -   HDL >40 mg/dL 48 43 - 33(L) -   Trlycerides <150 mg/dL 170(H) 256(H) - 239(H) -   Hemoglobin A1c <5.7 % 6.4(H) 6.4(H) - - 6.3(H)   TCO2 0 - 100 mmol/L - - 27 - -      Capillary Blood Glucose: Lab Results  Component Value Date   GLUCAP 80 06/20/2015   GLUCAP 123 (H) 06/15/2015   GLUCAP 94 12/11/2013     ADL UCSD:     Pulmonary Assessment Scores    Row Name 09/23/15 1621          ADL UCSD   ADL Phase Entry     SOB Score total 15        Pulmonary Function Assessment:     Pulmonary Function Assessment - 09/19/15 1022      Breath   Bilateral Breath Sounds Clear   Shortness of Breath Yes;Limiting activity  Exercise Target Goals:    Exercise Program Goal: Individual exercise prescription set with THRR, safety & activity barriers. Participant demonstrates ability to understand and report RPE using BORG scale, to self-measure pulse accurately, and to acknowledge the importance of the exercise prescription.  Exercise Prescription Goal: Starting with aerobic activity 30 plus minutes a day, 3 days per week for initial exercise prescription. Provide home exercise prescription and guidelines that participant acknowledges understanding prior to discharge.  Activity Barriers & Risk Stratification:     Activity Barriers & Cardiac Risk Stratification - 09/19/15 1021      Activity Barriers & Cardiac Risk Stratification   Activity Barriers Shortness of Breath;Left Knee Replacement;Right Knee Replacement;Arthritis;Joint Problems;Muscular Weakness      6 Minute Walk:     6 Minute Walk    Row Name 09/25/15 1624         6 Minute Walk   Phase Initial     Distance 1100 feet     Walk Time 6 minutes     # of Rest Breaks 0     MPH 2.08     METS 2.53     RPE 12     Perceived Dyspnea  2     Symptoms Yes (comment)     Comments back ache     Resting HR 70 bpm     Resting BP 124/64     Max Ex. HR 97 bpm     Max Ex. BP 140/68       Interval HR   Baseline HR 70     1 Minute HR 80     2 Minute HR 91     3 Minute HR 97     4 Minute HR 87     5 Minute HR 96     6 Minute HR 96     2 Minute Post HR 74     Interval Heart Rate? Yes       Interval Oxygen   Interval Oxygen? Yes     Baseline Oxygen Saturation % 93 %     Baseline Liters of Oxygen 0 L     1 Minute Oxygen Saturation % 91 %     1 Minute Liters of Oxygen 0 L     2 Minute Oxygen  Saturation % 92 %     2 Minute Liters of Oxygen 0 L     3 Minute Oxygen Saturation % 92 %     3 Minute Liters of Oxygen 0 L     4 Minute Oxygen Saturation % 93 %     4 Minute Liters of Oxygen 0 L     5 Minute Oxygen Saturation % 93 %     5 Minute Liters of Oxygen 0 L     6 Minute Oxygen Saturation % 93 %     6 Minute Liters of Oxygen 0 L     2 Minute Post Oxygen Saturation % 96 %     2 Minute Post Liters of Oxygen 0 L        Initial Exercise Prescription:     Initial Exercise Prescription - 09/25/15 1600      Date of Initial Exercise RX and Referring Provider   Date 09/25/15   Referring Provider Dr. Chase Caller     Bike   Level 0.5   Minutes 17     NuStep   Level 2   Minutes 17   METs 1.5     Track  Laps 6   Minutes 17     Prescription Details   Frequency (times per week) 2   Duration Progress to 45 minutes of aerobic exercise without signs/symptoms of physical distress     Intensity   THRR 40-80% of Max Heartrate 59-118   Ratings of Perceived Exertion 11-13   Perceived Dyspnea 0-4     Progression   Progression Continue progressive overload as per policy without signs/symptoms or physical distress.     Resistance Training   Training Prescription Yes   Weight blue bands   Reps 10-12      Perform Capillary Blood Glucose checks as needed.  Exercise Prescription Changes:     Exercise Prescription Changes    Row Name 10/02/15 1200 10/07/15 1200 10/09/15 1200 10/14/15 1200 10/16/15 1200     Exercise Review   Progression  -  -  - Yes Yes     Response to Exercise   Blood Pressure (Admit) 120/60 106/60 118/70 120/60 110/50   Blood Pressure (Exercise) 150/70 132/80 138/78 130/70 116/60   Blood Pressure (Exit) 134/82 110/72 116/60 124/64 104/60   Heart Rate (Admit) 81 bpm 84 bpm 70 bpm 77 bpm 65 bpm   Heart Rate (Exercise) 95 bpm 87 bpm 92 bpm 90 bpm 83 bpm   Heart Rate (Exit) 87 bpm 69 bpm 76 bpm 72 bpm 73 bpm   Oxygen Saturation (Admit) 96 % 94 % 97 %  97 % 97 %   Oxygen Saturation (Exercise) 97 % 93 % 97 % 96 % 98 %   Oxygen Saturation (Exit) 97 % 95 % 96 % 97 % 96 %   Rating of Perceived Exertion (Exercise) 11 13 11 13 13    Perceived Dyspnea (Exercise) 0 2 0 1 1   Duration Progress to 45 minutes of aerobic exercise without signs/symptoms of physical distress Progress to 45 minutes of aerobic exercise without signs/symptoms of physical distress Progress to 45 minutes of aerobic exercise without signs/symptoms of physical distress Progress to 45 minutes of aerobic exercise without signs/symptoms of physical distress Progress to 45 minutes of aerobic exercise without signs/symptoms of physical distress   Intensity THRR unchanged THRR unchanged THRR unchanged THRR unchanged THRR unchanged     Progression   Progression Continue progressive overload as per policy without signs/symptoms or physical distress. Continue progressive overload as per policy without signs/symptoms or physical distress. Continue progressive overload as per policy without signs/symptoms or physical distress. Continue to progress workloads to maintain intensity without signs/symptoms of physical distress. Continue to progress workloads to maintain intensity without signs/symptoms of physical distress.     Resistance Training   Training Prescription Yes Yes Yes Yes Yes   Weight blue bands blue bands blue bands blue bands blue bands   Reps 10-12 10-12  10 minutesof strength training 10-12  10 minutesof strength training 10-12  10 minutes of strength training 10-12  10 minutes of strength training     Interval Training   Interval Training No No No No No     Bike   Level 0.5 0.5 0.5 0.5 0.5   Minutes 17 17 17 17 17      NuStep   Level 2 2  - 3 4   Minutes 17 17  - 17 17   METs 2 1.9  - 1.9 2.3     Track   Laps  - 9 14 16   -   Minutes  - 17 17 17   -   Row Name 10/21/15  1200 10/30/15 1200           Exercise Review   Progression Yes  -        Response to  Exercise   Blood Pressure (Admit) 110/54 128/64      Blood Pressure (Exercise) 120/80 144/66      Blood Pressure (Exit) 130/62 138/74      Heart Rate (Admit) 65 bpm 87 bpm      Heart Rate (Exercise) 76 bpm 97 bpm      Heart Rate (Exit) 73 bpm 78 bpm      Oxygen Saturation (Admit) 97 % 90 %      Oxygen Saturation (Exercise) 95 % 95 %      Oxygen Saturation (Exit) 96 % 95 %      Rating of Perceived Exertion (Exercise) 11 9      Perceived Dyspnea (Exercise) 1 0      Duration Progress to 45 minutes of aerobic exercise without signs/symptoms of physical distress Progress to 45 minutes of aerobic exercise without signs/symptoms of physical distress      Intensity THRR unchanged THRR unchanged        Progression   Progression Continue to progress workloads to maintain intensity without signs/symptoms of physical distress. Continue to progress workloads to maintain intensity without signs/symptoms of physical distress.        Resistance Training   Training Prescription Yes Yes      Weight blue bands blue bands      Reps 10-12  10 minutes of strength training 10-12  10 minutes of strength training        Interval Training   Interval Training No No        Bike   Level 0.8 0.9      Minutes 17 17        NuStep   Level 4 4      Minutes 17 17      METs 2.8 2.3        Track   Laps 19  -      Minutes 17  -         Exercise Comments:     Exercise Comments    Row Name 10/13/15 1019 11/10/15 1649         Exercise Comments Has only attended three sessions. Too soon to monitor progression.  Patient is slowly progressing. Patient is up to walking 19 laps in 15 minutes. Is currently out on vacation. Will cont. to monitor and progress when he returns to rehab.          Discharge Exercise Prescription (Final Exercise Prescription Changes):     Exercise Prescription Changes - 10/30/15 1200      Response to Exercise   Blood Pressure (Admit) 128/64   Blood Pressure (Exercise) 144/66    Blood Pressure (Exit) 138/74   Heart Rate (Admit) 87 bpm   Heart Rate (Exercise) 97 bpm   Heart Rate (Exit) 78 bpm   Oxygen Saturation (Admit) 90 %   Oxygen Saturation (Exercise) 95 %   Oxygen Saturation (Exit) 95 %   Rating of Perceived Exertion (Exercise) 9   Perceived Dyspnea (Exercise) 0   Duration Progress to 45 minutes of aerobic exercise without signs/symptoms of physical distress   Intensity THRR unchanged     Progression   Progression Continue to progress workloads to maintain intensity without signs/symptoms of physical distress.     Resistance Training   Training Prescription Yes   Weight  blue bands   Reps 10-12  10 minutes of strength training     Interval Training   Interval Training No     Bike   Level 0.9   Minutes 17     NuStep   Level 4   Minutes 17   METs 2.3       Nutrition:  Target Goals: Understanding of nutrition guidelines, daily intake of sodium 1500mg , cholesterol 200mg , calories 30% from fat and 7% or less from saturated fats, daily to have 5 or more servings of fruits and vegetables.  Biometrics:     Pre Biometrics - 09/19/15 1056      Pre Biometrics   Grip Strength 35 kg       Nutrition Therapy Plan and Nutrition Goals:   Nutrition Discharge: Rate Your Plate Scores:   Psychosocial: Target Goals: Acknowledge presence or absence of depression, maximize coping skills, provide positive support system. Participant is able to verbalize types and ability to use techniques and skills needed for reducing stress and depression.  Initial Review & Psychosocial Screening:     Initial Psych Review & Screening - 10/14/15 0712      Barriers   Psychosocial barriers to participate in program There are no identifiable barriers or psychosocial needs.      Quality of Life Scores:     Quality of Life - 09/23/15 1622      Quality of Life Scores   Health/Function Pre 14.73 %   Socioeconomic Pre 21.07 %   Psych/Spiritual Pre 15.36 %    Family Pre 3 %   GLOBAL Pre 14.79 %      PHQ-9: Recent Review Flowsheet Data    Depression screen [facility] 2/9 09/19/2015 07/04/2015 07/02/2015   Decreased Interest 0 3 0   Down, Depressed, Hopeless 0 3 0   PHQ - 2 Score 0 6 0   Altered sleeping - 2 -   Tired, decreased energy - 3 -   Change in appetite - 3 -   Feeling bad or failure about yourself  - 3 -   Trouble concentrating - 2 -   Moving slowly or fidgety/restless - 0 -   Suicidal thoughts - 0 -   PHQ-9 Score - 19 -   Difficult doing work/chores - Extremely dIfficult -      Psychosocial Evaluation and Intervention:     Psychosocial Evaluation - 10/14/15 0713      Psychosocial Evaluation & Interventions   Interventions Encouraged to exercise with the program and follow exercise prescription   Comments Initial review identified potential psychosocial barriers however patient has attended all exercise and education sessions since admission      Psychosocial Re-Evaluation:     Psychosocial Re-Evaluation    Row Name 10/14/15 0714 11/10/15 1640           Psychosocial Re-Evaluation   Interventions Encouraged to attend Pulmonary Rehabilitation for the exercise Encouraged to attend Pulmonary Rehabilitation for the exercise      Comments no psychosocial barriers to participation identified at this time no psychosocial barriers to participation identified at this time        Education: Education Goals: Education classes will be provided on a weekly basis, covering required topics. Participant will state understanding/return demonstration of topics presented.  Learning Barriers/Preferences:     Learning Barriers/Preferences - 09/19/15 1022      Learning Barriers/Preferences   Learning Barriers None   Learning Preferences Computer/Internet;Written Material;Skilled Demonstration;Individual Instruction;Group Instruction      Education  Topics: Risk Factor Reduction:  -Group instruction that is supported by a  PowerPoint presentation. Instructor discusses the definition of a risk factor, different risk factors for pulmonary disease, and how the heart and lungs work together.     Nutrition for Pulmonary Patient:  -Group instruction provided by PowerPoint slides, verbal discussion, and written materials to support subject matter. The instructor gives an explanation and review of healthy diet recommendations, which includes a discussion on weight management, recommendations for fruit and vegetable consumption, as well as protein, fluid, caffeine, fiber, sodium, sugar, and alcohol. Tips for eating when patients are short of breath are discussed.   Pursed Lip Breathing:  -Group instruction that is supported by demonstration and informational handouts. Instructor discusses the benefits of pursed lip and diaphragmatic breathing and detailed demonstration on how to preform both.   Flowsheet Row PULMONARY REHAB OTHER RESPIRATORY from 10/16/2015 in [place]  Date  10/16/15  Educator  ep  Instruction Review Code  2- meets goals/outcomes      Oxygen Safety:  -Group instruction provided by PowerPoint, verbal discussion, and written material to support subject matter. There is an overview of "What is Oxygen" and "Why do we need it".  Instructor also reviews how to create a safe environment for oxygen use, the importance of using oxygen as prescribed, and the risks of noncompliance. There is a brief discussion on traveling with oxygen and resources the patient may utilize.   Oxygen Equipment:  -Group instruction provided by [facility] Staff utilizing handouts, written materials, and equipment demonstrations.   Signs and Symptoms:  -Group instruction provided by written material and verbal discussion to support subject matter. Warning signs and symptoms of infection, stroke, and heart attack are reviewed and when to call the physician/911 reinforced. Tips for preventing the  spread of infection discussed. Flowsheet Row PULMONARY REHAB OTHER RESPIRATORY from 10/16/2015 in [place]  Date  10/02/15  Educator  RN  Instruction Review Code  2- meets goals/outcomes      Advanced Directives:  -Group instruction provided by verbal instruction and written material to support subject matter. Instructor reviews Advanced Directive laws and proper instruction for filling out document.   Pulmonary Video:  -Group video education that reviews the importance of medication and oxygen compliance, exercise, good nutrition, pulmonary hygiene, and pursed lip and diaphragmatic breathing for the pulmonary patient.   Exercise for the Pulmonary Patient:  -Group instruction that is supported by a PowerPoint presentation. Instructor discusses benefits of exercise, core components of exercise, frequency, duration, and intensity of an exercise routine, importance of utilizing pulse oximetry during exercise, safety while exercising, and options of places to exercise outside of rehab.     Pulmonary Medications:  -Verbally interactive group education provided by instructor with focus on inhaled medications and proper administration.   Anatomy and Physiology of the Respiratory System and Intimacy:  -Group instruction provided by PowerPoint, verbal discussion, and written material to support subject matter. Instructor reviews respiratory cycle and anatomical components of the respiratory system and their functions. Instructor also reviews differences in obstructive and restrictive respiratory diseases with examples of each. Intimacy, Sex, and Sexuality differences are reviewed with a discussion on how relationships can change when diagnosed with pulmonary disease. Common sexual concerns are reviewed.   Knowledge Questionnaire Score:     Knowledge Questionnaire Score - 09/23/15 1620      Knowledge Questionnaire Score   Pre Score 6/13      Core  Components/Risk Factors/Patient Goals at Admission:     Personal Goals and Risk Factors at Admission - 09/19/15 1023      Core Components/Risk Factors/Patient Goals on Admission    Weight Management Yes;Obesity;Weight Loss   Intervention Weight Management/Obesity: Establish reasonable short term and long term weight goals.;Obesity: Provide education and appropriate resources to help participant work on and attain dietary goals.   Expected Outcomes Short Term: Continue to assess and modify interventions until short term weight is achieved;Long Term: Adherence to nutrition and physical activity/exercise program aimed toward attainment of established weight goal;Weight Loss: Understanding of general recommendations for a balanced deficit meal plan, which promotes 1-2 lb weight loss per week and includes a negative energy balance of [phone] kcal/d;Understanding recommendations for meals to include 15-35% energy as protein, 25-35% energy from fat, 35-60% energy from carbohydrates, less than 200mg  of dietary cholesterol, 20-35 gm of total fiber daily;Understanding of distribution of calorie intake throughout the day with the consumption of 4-5 meals/snacks   Sedentary Yes   Intervention Provide advice, education, support and counseling about physical activity/exercise needs.;Develop an individualized exercise prescription for aerobic and resistive training based on initial evaluation findings, risk stratification, comorbidities and participant's personal goals.   Expected Outcomes Achievement of increased cardiorespiratory fitness and enhanced flexibility, muscular endurance and strength shown through measurements of functional capacity and personal statement of participant.   Increase Strength and Stamina Yes   Intervention Provide advice, education, support and counseling about physical activity/exercise needs.;Develop an individualized exercise prescription for aerobic and resistive training based on  initial evaluation findings, risk stratification, comorbidities and participant's personal goals.   Expected Outcomes Achievement of increased cardiorespiratory fitness and enhanced flexibility, muscular endurance and strength shown through measurements of functional capacity and personal statement of participant.   Tobacco Cessation Yes   Intervention Assist the participant in steps to quit. Provide individualized education and counseling about committing to Tobacco Cessation, relapse prevention, and pharmacological support that can be provided by physician.;Advice worker, assist with locating and accessing local/national Quit Smoking programs, and support quit date choice.   Expected Outcomes Short Term: Will demonstrate readiness to quit, by selecting a quit date.;Long Term: Complete abstinence from all tobacco products for at least 12 months from quit date.;Short Term: Will quit all tobacco product use, adhering to prevention of relapse plan.   Improve shortness of breath with ADL's Yes   Intervention Provide education, individualized exercise plan and daily activity instruction to help decrease symptoms of SOB with activities of daily living.   Expected Outcomes Short Term: Achieves a reduction of symptoms when performing activities of daily living.   Develop more efficient breathing techniques such as purse lipped breathing and diaphragmatic breathing; and practicing self-pacing with activity Yes   Intervention Provide education, demonstration and support about specific breathing techniuqes utilized for more efficient breathing. Include techniques such as pursed lipped breathing, diaphragmatic breathing and self-pacing activity.   Expected Outcomes Short Term: Participant will be able to demonstrate and use breathing techniques as needed throughout daily activities.      Core Components/Risk Factors/Patient Goals Review:      Goals and Risk Factor Review    Row Name 10/14/15  0706 11/10/15 1640           Core Components/Risk Factors/Patient Goals Review   Personal Goals Review Increase Strength and Stamina;Sedentary;Weight Management/Obesity;Tobacco Cessation;Improve shortness of breath with ADL's;Develop more efficient breathing techniques such as purse lipped breathing and diaphragmatic breathing and practicing self-pacing with activity. Increase Strength and  Stamina;Sedentary;Weight Management/Obesity;Tobacco Cessation;Improve shortness of breath with ADL's;Develop more efficient breathing techniques such as purse lipped breathing and diaphragmatic breathing and practicing self-pacing with activity.      Review see "comments" section on ITP see "comments" section on ITP      Expected Outcomes See "Admission" expected outcomes See "Admission" expected outcomes         Core Components/Risk Factors/Patient Goals at Discharge (Final Review):      Goals and Risk Factor Review - 11/10/15 1640      Core Components/Risk Factors/Patient Goals Review   Personal Goals Review Increase Strength and Stamina;Sedentary;Weight Management/Obesity;Tobacco Cessation;Improve shortness of breath with ADL's;Develop more efficient breathing techniques such as purse lipped breathing and diaphragmatic breathing and practicing self-pacing with activity.   Review see "comments" section on ITP   Expected Outcomes See "Admission" expected outcomes      ITP Comments:   Comments: ITP REVIEW Pt continues to make slow progress toward pulmonary rehab goals after completing 7 sessions. This is due to his "spotty" attendance. Things such as vacations and discomforts from arthritis have prevented his regular attendance. Recommend continued exercise, life style modification, education, and utilization of breathing techniques to increase stamina and strength and decrease shortness of breath with exertion.

## 2015-11-13 ENCOUNTER — Encounter (HOSPITAL_COMMUNITY)
Admission: RE | Admit: 2015-11-13 | Discharge: 2015-11-13 | Disposition: A | Discharge status: Home / Self Care | Payer: Medicare Other | Source: Ambulatory Visit | Attending: Internal Medicine | Admitting: Internal Medicine

## 2015-11-13 VITALS — Wt 216.7 lb

## 2015-11-13 DIAGNOSIS — J439 Emphysema, unspecified: Secondary | ICD-10-CM

## 2015-11-13 NOTE — Progress Notes (Signed)
Daily Session Note  Patient Details  Name: ALFIE RIDEAUX MRN: [id number] Date of Birth: 28-Aug-1940 Referring Provider:   April Manson Pulmonary Rehab Walk Test from 09/25/2015 in [place]  Referring Provider  Dr. Chase Caller      Encounter Date: 11/13/2015  Check In:     Session Check In - 11/13/15 1020      Check-In   Location [facility]   Staff Present Rosebud Poles, RN, BSN;Molly diVincenzo, MS, ACSM RCEP, Exercise Physiologist;Marquice Uddin Ysidro Evert, Felipe Drone, RN, MHA;Portia Rollene Rotunda, RN, BSN   Supervising physician immediately available to respond to emergencies [facility] immediately available   Physician(s) Dr. Tana Coast   Medication changes reported     No   Warm-up and Cool-down Performed as group-led instruction   Resistance Training Performed Yes   VAD Patient? No     Pain Assessment   Currently in Pain? No/denies   Multiple Pain Sites No      Capillary Blood Glucose: No results found for this or any previous visit (from the past 24 hour(s)).      Exercise Prescription Changes - 11/13/15 1300      Response to Exercise   Blood Pressure (Admit) 116/60   Blood Pressure (Exercise) 104/60   Blood Pressure (Exit) 120/70   Heart Rate (Admit) 75 bpm   Heart Rate (Exercise) 90 bpm   Heart Rate (Exit) 72 bpm   Oxygen Saturation (Admit) 95 %   Oxygen Saturation (Exercise) 95 %   Oxygen Saturation (Exit) 97 %   Rating of Perceived Exertion (Exercise) 9   Perceived Dyspnea (Exercise) 0   Duration Progress to 45 minutes of aerobic exercise without signs/symptoms of physical distress   Intensity THRR unchanged     Progression   Progression Continue to progress workloads to maintain intensity without signs/symptoms of physical distress.     Resistance Training   Training Prescription Yes   Weight blue bands   Reps 10-12  10 minutes of strength training     Interval Training   Interval Training No     Bike   Level 0.8   Minutes 17     NuStep   Level 5   Minutes 17   METs 2.7     Goals Met:  Exercise tolerated well No report of cardiac concerns or symptoms Strength training completed today  Goals Unmet:  Not Applicable  Comments: Service time is from 1030 to 1230    Dr. Rush Farmer is Medical Director for Pulmonary Rehab at [facility] [place].

## 2015-11-16 ENCOUNTER — Other Ambulatory Visit: Payer: Self-pay | Admitting: Family Medicine

## 2015-11-17 ENCOUNTER — Encounter: Payer: Self-pay | Admitting: Neurology

## 2015-11-17 ENCOUNTER — Ambulatory Visit (INDEPENDENT_AMBULATORY_CARE_PROVIDER_SITE_OTHER): Payer: Medicare Other | Admitting: Neurology

## 2015-11-17 VITALS — BP 123/80 | HR 81 | Wt 219.2 lb

## 2015-11-17 DIAGNOSIS — E785 Hyperlipidemia, unspecified: Secondary | ICD-10-CM | POA: Diagnosis not present

## 2015-11-17 DIAGNOSIS — I712 Thoracic aortic aneurysm, without rupture, unspecified: Secondary | ICD-10-CM

## 2015-11-17 DIAGNOSIS — I10 Essential (primary) hypertension: Secondary | ICD-10-CM | POA: Diagnosis not present

## 2015-11-17 DIAGNOSIS — I639 Cerebral infarction, unspecified: Secondary | ICD-10-CM | POA: Diagnosis not present

## 2015-11-17 DIAGNOSIS — Z8673 Personal history of transient ischemic attack (TIA), and cerebral infarction without residual deficits: Secondary | ICD-10-CM | POA: Diagnosis not present

## 2015-11-17 NOTE — Progress Notes (Signed)
STROKE NEUROLOGY FOLLOW UP NOTE  NAME: [name] [name] DOB: 09/22/1940  REASON FOR VISIT: stroke follow up HISTORY FROM: pt and wife and chart  Today we had the pleasure of seeing [name] [name] in follow-up at our Neurology Clinic. Pt was accompanied by wife.   History Summary [name] [name] is a 75 y.o. male with history of MS diagnosis in 1963 not on meds, CAD, thoracic AA, history of lung nodule benign, urethral carcinoma following with [facility], diastolic CHF, HTN, HLD and GERD admitted on 06/15/2015 for slurred speech. 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 CT head, carotid Doppler, TTE were all negative. LDL 91 and A1c 6.4. His aspirin 81 changed to 325, resumed Lipitor 20, and he was discharged home outpatient PT OT and plan with outpatient TEE and Loop recorder. However, he was readmitted on 06/20/2015 for presyncope with UTI, generalized weakness, dysarthria and gait instability. MRI showed essentially the same infarct as last admission. Symptoms consider as recrudescence of previous stroke in the setting of infection and presyncope. DVT and TEE negative without PFO. Loop recorder placed. Aspirin was changed to Plavix on discharge.  Follow up 08/14/15 - the patient has been doing well. Wife complains of mild memory loss after stroke. Blood pressure today 105/71. On Plavix and Lipitor, without side effect. Not candidate for RESPECT ESUS due to urethral cancer.     Interval History During the interval time, he has been doing well. Had loop recorder placed but has some signal problems and no report since July. Pt and wife are contacting cardiology. On plavix and lipitor and no side effect. Today BP 123/80 in clinic but at home still high between 140-155. He is on 3 BP meds, following with PCP.   REVIEW OF SYSTEMS: Full 14 system review of systems performed and notable only for those listed below and in HPI above, all  others are negative:  Constitutional:  fatigue Cardiovascular: Leg swelling Ear/Nose/Throat:  Hearing loss Skin:  Eyes:   Respiratory:  SOB, cough, wheezing Gastroitestinal:  Constipation Genitourinary: frequent urination, urgency Hematology/Lymphatic:   Endocrine: Cold intolerance, excessive eating Musculoskeletal:  Joint pain Allergy/Immunology:   Neurological:  Memory loss, weakness Psychiatric: Decreased concentration Sleep: Restless leg, daytime sleepiness, snoring, frequent waking  The following represents the patient's updated allergies and side effects list: Allergies  Allergen Reactions  . Acetylcholine     unknown  . Alcohol-Sulfur [Sulfur]     unknown  . Amitriptyline     unknown  . Bupivacaine     unknown  . Clomipramine Hcl     unknown  . Cocaine     unknown  . Desipramine     unknown  . Ergonovine     unknown  . Flecainide     unknown  . Lithium     unknown  . Loxapine     unknown  . Nortriptyline     unknown  . Oxcarbazepine     unknown  . Procainamide     unknown  . Procaine     unknwon  . Propafenone     unknown  . Propofol     unknown  . Trifluoperazine     unknown    The neurologically relevant items on the patient's problem list were reviewed on today's visit.  Neurologic Examination  A problem focused neurological exam (12 or more points of the single system neurologic examination, vital signs counts as 1 point,  cranial nerves count for 8 points) was performed.  Blood pressure 123/80, pulse 81, weight 219 lb 3.2 oz (99.4 kg).  General - Well nourished, well developed, in no apparent distress.  Ophthalmologic - Sharp disc margins OU.   Cardiovascular - Regular rate and rhythm with no murmur.  Mental Status -  Level of arousal and orientation to time, place, and person were intact. Language including expression, naming, repetition, comprehension was assessed and found intact.  Cranial Nerves II - XII - II - Visual field  intact OU. III, IV, VI - Extraocular movements intact. V - Facial sensation intact bilaterally. VII - Facial movement intact bilaterally. VIII - Hearing & vestibular intact bilaterally. X - Palate elevates symmetrically. XI - Chin turning & shoulder shrug intact bilaterally. XII - Tongue protrusion intact.  Motor Strength - The patient's strength was normal in all extremities and pronator drift was absent.  Bulk was normal and fasciculations were absent.   Motor Tone - Muscle tone was assessed at the neck and appendages and was normal.  Reflexes - The patient's reflexes were 1+ in all extremities and he had no pathological reflexes.  Sensory - Light touch, temperature/pinprick, vibration and proprioception, and Romberg testing were assessed and were normal.    Coordination - The patient had normal movements in the hands and feet with no ataxia or dysmetria.  Tremor was absent.  Gait and Station - The patient's transfers, posture, gait, station, and turns were observed as normal.   Data reviewed: I personally reviewed the images and agree with the radiology interpretations.  Ct Head Wo Contrast 06/20/2015   Stable atrophic and ischemic changes. No acute abnormality is noted.   MRI brain 06/15/15 Areas of acute infarction in the region of the insula and posterior frontal lobe on the right consistent with right MCA branch vessel occlusion. No swelling or hemorrhage. Extensive chronic small vessel ischemic changes elsewhere throughout the brain, progressive since the previous exam. Given the history of multiple sclerosis clinically, some of these white matter lesions could relate to chronic demyelination.  [name] [name] Contrast 06/20/2015   Subcentimeter focus of restricted diffusion in the superior RIGHT insula not present previously, representing progression of ischemia. Otherwise stable multifocal areas of acute RIGHT MCA infarction. No interval development of large vessel  occlusion, or significant mass effect. Mild postcontrast enhancement, consistent with the subacute time course. There is no significant change on DWI comparing with previous MRI. The "new" DWI signal likely the same DWI as before just due to different cut.   CT Angio Head 06/16/2015 CT HEAD: Evolving small RIGHT frontal/ MCA territory infarct. Moderate to severe white matter changes better characterized on recent MRI of the brain. CTA HEAD: Negative.  2D Echocardiogram 06/16/15 - Left ventricle: The cavity size was normal. There was mild focal basal hypertrophy of the septum. Systolic function was normal. The estimated ejection fraction was in the range of 60% to 65%. Wall motion was normal; there were no regional wall motion abnormalities. Doppler parameters are consistent with abnormal left ventricular relaxation (grade 1 diastolic dysfunction). - Aortic valve: There was mild regurgitation. Impressions: - No cardiac source of emboli was indentified.  Carotid Doppler 06/16/15 There is 1-39% bilateral ICA stenosis. Vertebral artery flow is antegrade.   LE venous doppler - There is no DVT or SVT noted in the bilateral lower extremities.   TEE - normal EF, no SOE, no PFO  CT chest 06/05/15 1. The nodules are stable from 06/26/2013. More spiculated  nodules inferomedially in the right middle lobe were not previously positive on PET scan and accordingly are likely benign. The 5 by 6 mm nodule is also stable over the past 2 years. This 2 year stability is compatible with benign process, and based on current guidelines, further follow up surveillance is not required. 2. 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 Recommend annual imaging followup by CTA or MRA. 3. Coronary, aortic arch, and branch vessel atherosclerotic vascular disease.  Component     Latest Ref Rng & Units 06/16/2015 06/21/2015 06/26/2015  Cholesterol     0 - 200 mg/dL 185 140   Triglycerides     <150 mg/dL 256  (H) 239 (H)   HDL Cholesterol     >40 mg/dL 43 33 (L)   Total CHOL/HDL Ratio     RATIO 4.3 4.2   VLDL     0 - 40 mg/dL 51 (H) 48 (H)   LDL (calc)     0 - 99 mg/dL 91 59   Hemoglobin A1C     <5.7 % 6.4 (H)  6.3 (H)  Mean Plasma Glucose     mg/dL 137  134    Assessment: As you may recall, he is a 75 y.o. Caucasian male with PMH of MS diagnosis in 1963 not on meds, CAD, thoracic AA, history of lung nodule benign, urethral carcinoma following with [facility], diastolic CHF, HTN, HLD and GERD admitted on 06/15/2015 for right MCA branch vessel occlusion with infarcts at right insular and posterior frontal lobe. CT head, carotid Doppler, TTE were all negative. LDL 91 and A1c 6.4. His aspirin 81 changed to 325, resumed Lipitor 20, and he was discharged home outpatient PT OT and plan with outpatient TEE and Loop recorder. However, he was readmitted on 06/20/2015 for presyncope with UTI, generalized weakness, dysarthria and gait instability. MRI showed essentially the same infarct as last admission. Symptoms consider as recrudescence of previous stroke in the setting of infection and presyncope. DVT and TEE negative without PFO. Loop recorder placed. Aspirin was changed to Plavix on discharge. Not candidate for RESPECT ESUS due to urethral cancer. So far loop recording no report since 07/2015, need to contact cardiology.  Regarding previous MS diagnosis in 1960s, patient has not been on any MS medication, no MS relapse, no significant symptoms of MS, I am not sure about the diagnosis, MRI white matter changes could be small vessel etiology instead of chronic demyelination. Do not think he needs of initiating MS treatment at this time.  Plan:  - continue plavix and lipitor for stroke prevention - follow up loop recorder with cardiology - Follow up with your primary care physician for stroke risk factor modification. Recommend maintain blood pressure goal 130-140/80-90, diabetes with hemoglobin A1c goal below  7.0% and lipids with LDL cholesterol goal below 70 mg/dL.  - check BP at home and record. Consider increase bisoprolol to 10mg  if BP constantly high. Call Dr. Dennard Schaumann for early appointment - healthy diet and regular exercise - follow up in 6 months.  I spent more than 25 minutes of face to face time with the patient. Greater than 50% of time was spent in counseling and coordination of care. We discussed continued antiplatelet therapy, BP monitoring and management, and follow up with cardiology for loop recorder.   No orders of the defined types were placed in this encounter.   Meds ordered this encounter  Medications  . MYRBETRIQ 25 MG TB24 tablet  . Cyanocobalamin (B-12) 1000  MCG CAPS    Sig: Take 10,000 mcg by mouth daily.  . folic acid (FOLVITE) 1 MG tablet    Sig: Take 1 mg by mouth daily.  Marland Kitchen MAGNESIUM CITRATE PO    Sig: Take by mouth.  Marland Kitchen UNABLE TO FIND    Sig: Tumeric daily    Patient Instructions  - continue plavix and lipitor for stroke prevention - follow up loop recorder with cardiology - Follow up with your primary care physician for stroke risk factor modification. Recommend maintain blood pressure goal 130-140/80-90, diabetes with hemoglobin A1c goal below 7.0% and lipids with LDL cholesterol goal below 70 mg/dL.  - check BP at home and record. Consider increase bisoprolol to 10mg  if BP constantly high. Call Dr. Dennard Schaumann for early appointment - healthy diet and regular exercise - follow up in 6 months.   Rosalin Hawking, MD PhD [facility] [address] [place], [place] [address] [phone]

## 2015-11-17 NOTE — Patient Instructions (Addendum)
-   continue plavix and lipitor for stroke prevention - follow up loop recorder with cardiology - Follow up with your primary care physician for stroke risk factor modification. Recommend maintain blood pressure goal 130-140/80-90, diabetes with hemoglobin A1c goal below 7.0% and lipids with LDL cholesterol goal below 70 mg/dL.  - check BP at home and record. Consider increase bisoprolol to 10mg  if BP constantly high. Call Dr. Dennard Schaumann for early appointment - healthy diet and regular exercise - follow up in 6 months.

## 2015-11-18 ENCOUNTER — Encounter (HOSPITAL_COMMUNITY)
Admission: RE | Admit: 2015-11-18 | Discharge: 2015-11-18 | Disposition: A | Discharge status: Home / Self Care | Payer: Medicare Other | Source: Ambulatory Visit | Attending: Internal Medicine | Admitting: Internal Medicine

## 2015-11-18 VITALS — Wt 217.6 lb

## 2015-11-18 DIAGNOSIS — J439 Emphysema, unspecified: Secondary | ICD-10-CM | POA: Diagnosis not present

## 2015-11-18 LAB — CUP PACEART INCLINIC DEVICE CHECK
Implantable Pulse Generator Implant Date: 20170619
MDC IDC SESS DTM: 20171114150944

## 2015-11-18 NOTE — Progress Notes (Signed)
Daily Session Note  Patient Details  Name: [name] [name] MRN: [id number] Date of Birth: Apr 19, 1940 Referring Provider:   April Manson Pulmonary Rehab Walk Test from 09/25/2015 in [place]  Referring Provider  Dr. Chase Caller      Encounter Date: 11/18/2015  Check In:     Session Check In - 11/18/15 1214      Check-In   Location [facility]   Staff Present Rosebud Poles, RN, BSN;Wilver Tignor, MS, ACSM RCEP, Exercise Physiologist;Lisa Ysidro Evert, Duffy Bruce, RN, BSN   Supervising physician immediately available to respond to emergencies [facility] immediately available   Physician(s) Dr. Tana Coast   Medication changes reported     No   Fall or balance concerns reported    No   Warm-up and Cool-down Performed as group-led instruction   Resistance Training Performed Yes   VAD Patient? No     Pain Assessment   Currently in Pain? No/denies   Multiple Pain Sites No      Capillary Blood Glucose: No results found for this or any previous visit (from the past 24 hour(s)).      Exercise Prescription Changes - 11/18/15 1200      Exercise Review   Progression Yes     Response to Exercise   Blood Pressure (Admit) 108/60   Blood Pressure (Exercise) 144/80   Blood Pressure (Exit) 118/64   Heart Rate (Admit) 98 bpm   Heart Rate (Exercise) 114 bpm   Heart Rate (Exit) 92 bpm   Oxygen Saturation (Admit) 98 %   Oxygen Saturation (Exercise) 96 %   Oxygen Saturation (Exit) 96 %   Rating of Perceived Exertion (Exercise) 9   Perceived Dyspnea (Exercise) 0   Duration Progress to 45 minutes of aerobic exercise without signs/symptoms of physical distress   Intensity THRR unchanged     Progression   Progression Continue to progress workloads to maintain intensity without signs/symptoms of physical distress.     Resistance Training   Training Prescription Yes   Weight blue bands   Reps 10-12  10 minutes of strength  training     Interval Training   Interval Training No     Bike   Level 1.2   Minutes 17     NuStep   Level 5   Minutes 17   METs 2.6     Track   Laps 13   Minutes 17     Goals Met:  Exercise tolerated well No report of cardiac concerns or symptoms Strength training completed today  Goals Unmet:  Not Applicable  Comments: Service time is from 10:30am to 12:05pm    Dr. Rush Farmer is Medical Director for Pulmonary Rehab at [facility].

## 2015-11-20 ENCOUNTER — Encounter (HOSPITAL_COMMUNITY)
Admission: RE | Admit: 2015-11-20 | Discharge: 2015-11-20 | Disposition: A | Discharge status: Home / Self Care | Payer: Medicare Other | Source: Ambulatory Visit | Attending: Internal Medicine | Admitting: Internal Medicine

## 2015-11-20 VITALS — Wt 220.2 lb

## 2015-11-20 DIAGNOSIS — J439 Emphysema, unspecified: Secondary | ICD-10-CM | POA: Diagnosis not present

## 2015-11-20 NOTE — Progress Notes (Signed)
Daily Session Note  Patient Details  Name: [name] [name] MRN: [id number] Date of Birth: 12-Oct-1940 Referring Provider:   April Manson Pulmonary Rehab Walk Test from 09/25/2015 in [place]  Referring Provider  Dr. Chase Caller      Encounter Date: 11/20/2015  Check In:     Session Check In - 11/20/15 1051      Check-In   Location [facility]      Capillary Blood Glucose: No results found for this or any previous visit (from the past 24 hour(s)).      Exercise Prescription Changes - 11/20/15 1200      Exercise Review   Progression Yes     Response to Exercise   Blood Pressure (Admit) 156/74   Blood Pressure (Exercise) 140/70   Blood Pressure (Exit) 118/64   Heart Rate (Admit) 87 bpm   Heart Rate (Exercise) 91 bpm   Heart Rate (Exit) 80 bpm   Oxygen Saturation (Admit) 98 %   Oxygen Saturation (Exercise) 97 %   Oxygen Saturation (Exit) 96 %   Rating of Perceived Exertion (Exercise) 11   Perceived Dyspnea (Exercise) 1   Duration Progress to 45 minutes of aerobic exercise without signs/symptoms of physical distress   Intensity THRR unchanged     Progression   Progression Continue to progress workloads to maintain intensity without signs/symptoms of physical distress.     Resistance Training   Training Prescription Yes   Weight blue bands   Reps 10-12  10 minutes of strength training     Interval Training   Interval Training No     NuStep   Level 6   Minutes 17   METs 2.2     Track   Laps 13   Minutes 17     Goals Met:  Exercise tolerated well No report of cardiac concerns or symptoms Strength training completed today  Goals Unmet:  Not Applicable  Comments: Service time is from 1030 to 1220     Dr. Rush Farmer is Medical Director for Pulmonary Rehab at [facility].

## 2015-11-25 ENCOUNTER — Encounter (HOSPITAL_COMMUNITY)
Admission: RE | Admit: 2015-11-25 | Discharge: 2015-11-25 | Disposition: A | Discharge status: Home / Self Care | Payer: Medicare Other | Source: Ambulatory Visit | Attending: Internal Medicine | Admitting: Internal Medicine

## 2015-11-25 VITALS — Wt 218.0 lb

## 2015-11-25 DIAGNOSIS — J439 Emphysema, unspecified: Secondary | ICD-10-CM | POA: Diagnosis not present

## 2015-11-25 NOTE — Progress Notes (Signed)
Daily Session Note  Patient Details  Name: [name] [name] MRN: [id number] Date of Birth: [birth year] Referring Provider:   April Manson Pulmonary Rehab Walk Test from 09/25/2015 in [place]  Referring Provider  Dr. Chase Caller      Encounter Date: 11/25/2015  Check In:     Session Check In - 11/25/15 1030      Check-In   Location [facility]   Staff Present Rosebud Poles, RN, BSN;Molly diVincenzo, MS, ACSM RCEP, Exercise Physiologist;Lisa Ysidro Evert, RN;Hibo Blasdell Rollene Rotunda, RN, BSN   Supervising physician immediately available to respond to emergencies [facility] immediately available   Physician(s) Dr. Grandville Silos   Medication changes reported     No   Fall or balance concerns reported    No   Warm-up and Cool-down Performed as group-led instruction   Resistance Training Performed Yes   VAD Patient? No     Pain Assessment   Currently in Pain? No/denies      Capillary Blood Glucose: No results found for this or any previous visit (from the past 24 hour(s)).      Exercise Prescription Changes - 11/25/15 1207      Response to Exercise   Blood Pressure (Admit) 124/66   Blood Pressure (Exercise) 130/74   Blood Pressure (Exit) 112/60   Heart Rate (Admit) 68 bpm   Heart Rate (Exercise) 96 bpm   Heart Rate (Exit) 81 bpm   Oxygen Saturation (Admit) 98 %   Oxygen Saturation (Exercise) 95 %   Oxygen Saturation (Exit) 94 %   Rating of Perceived Exertion (Exercise) 13   Perceived Dyspnea (Exercise) 1   Duration Progress to 45 minutes of aerobic exercise without signs/symptoms of physical distress   Intensity THRR unchanged     Progression   Progression Continue to progress workloads to maintain intensity without signs/symptoms of physical distress.     Resistance Training   Training Prescription Yes   Weight blue bands   Reps 10-12  10 minutes of strength training     Interval Training   Interval Training No     Bike   Level 1.2   Minutes 17     NuStep   Level 6   Minutes 17   METs 2.4     Track   Laps 10   Minutes 17     Goals Met:  Improved SOB with ADL's Exercise tolerated well Queuing for purse lip breathing No report of cardiac concerns or symptoms Strength training completed today  Goals Unmet:  Not Applicable  Comments: Service time is from 1030 to 1200   Dr. Rush Farmer is Medical Director for Pulmonary Rehab at [facility].

## 2015-11-27 ENCOUNTER — Encounter (HOSPITAL_COMMUNITY): Payer: Medicare Other

## 2015-12-01 ENCOUNTER — Encounter: Payer: Self-pay | Admitting: Family Medicine

## 2015-12-01 ENCOUNTER — Ambulatory Visit (INDEPENDENT_AMBULATORY_CARE_PROVIDER_SITE_OTHER): Payer: Medicare Other | Admitting: Family Medicine

## 2015-12-01 VITALS — BP 136/80 | HR 68 | Temp 98.0°F | Resp 18 | Ht 71.0 in | Wt 222.0 lb

## 2015-12-01 DIAGNOSIS — N289 Disorder of kidney and ureter, unspecified: Secondary | ICD-10-CM

## 2015-12-01 DIAGNOSIS — I10 Essential (primary) hypertension: Secondary | ICD-10-CM | POA: Diagnosis not present

## 2015-12-01 DIAGNOSIS — I639 Cerebral infarction, unspecified: Secondary | ICD-10-CM

## 2015-12-01 DIAGNOSIS — N183 Chronic kidney disease, stage 3 unspecified: Secondary | ICD-10-CM

## 2015-12-01 DIAGNOSIS — R7303 Prediabetes: Secondary | ICD-10-CM | POA: Diagnosis not present

## 2015-12-01 MED ORDER — HYDROCODONE-ACETAMINOPHEN 10-325 MG PO TABS: 1.0000 | ORAL_TABLET | Freq: Four times a day (QID) | ORAL | 0 refills | Status: DC | PRN

## 2015-12-01 MED ORDER — PANTOPRAZOLE SODIUM 40 MG PO TBEC: 40.0000 mg | DELAYED_RELEASE_TABLET | Freq: Every day | ORAL | 3 refills | Status: DC

## 2015-12-01 MED ORDER — BISOPROLOL FUMARATE 10 MG PO TABS: 10.0000 mg | ORAL_TABLET | Freq: Every day | ORAL | 5 refills | Status: DC

## 2015-12-01 NOTE — Progress Notes (Signed)
Subjective:    Patient ID: [name] [name], male    DOB: [birth year], 75 y.o.   MRN: [id number]  HPI  09/04/15 Dr. Chase Caller recently center staff message noting that the patient's creatinine had risen to 1.3. His baseline is around 1. Is having concern that the patient was dehydrated. Therefore I asked him to come in for evaluation. I had asked the patient to temporarily discontinue his Lasix and try to drink more fluid. I have asked him for a urine sample today but he is unable to provide a urine sample. He has been off his Lasix. Therefore his legs are swelling. He has +1 edema in both legs to the knee. He denies any shortness of breath at rest. He has stable chronic dyspnea on exertion likely due to deconditioning as well as his COPD. Although he is here today mainly to recheck his renal function, he seems very lethargic and very sleepy. He honestly appears overmedicated. He is slow to respond to questions. There are no neurologic deficits on his exam. He is not taking any pain medication in the last 48 hours. He denies any other substance use. He is not taking Benadryl. However he is on many centrally acting medications including Abilify as well as ditropan.  At that time, my plan was: Patient is a prediabetic. I will check a urine microalbumin whenever he can give [place] urine sample. His blood pressures at home are borderline. The majority are between [id number] and [id number] systolic. However I'll recheck the patient's renal function today. I believe the increase in his creatinine was likely due to dehydration, his Lasix pill, and the fact that his wife has been giving him Celebrex on a daily basis for joint pain. We will recheck his renal function today and if his renal function continues to be elevated, we will need to have the patient avoid NSAIDs. I'm more concerned by his lethargy. I believe he is overmedicated. I recommended discontinuing oxybutynin. Reduce Abilify to 2.5 mg a day. Recheck in one week.  Consider a sleep study for obstructive sleep apnea if he continues to be hypersomnolent.  09/11/15 When I repeated the patient's renal function, his creatinine had risen to 1.43. Therefore since early June when his creatinine was 0.9 and his glomerular filtration rate was greater than 60, the patient's glomerular filtration rate had dropped to 43 mL of blood per minute with a creatinine of 1.43 by the end of August. However this is much as up with the initiation of Celebrex for his polyarthralgias. Furthermore the patient was also taking naproxen at home on his own in addition to his Lasix. Over the last week I have had the patient avoid all NSAIDs and also continue to hold his Lasix and push fluids. He is here today for recheck of his renal function. He has a renal ultrasound scheduled for next week. Of note he seems less lethargic and more alert now that he is no longer taking the oxybutynin and on the reduced dose of Abilify.  At that time, my plan was: I really believe the patient was overmedicated at his last visit. Therefore I'll make no changes in his medicine at this time. I am also hopeful that his decline in renal function can be attributed to his overuse of NSAIDs. I explained this in great detail to the patient. He should not use NSAIDs any further. Repeat renal function today. Obtain renal ultrasound. If renal ultrasound is reassuring and his renal function has improved off the NSAIDs,  no further workup is necessary. However we will need to create a plan to help manage the patient's pain. He is also using more than 3000 mg a day of Tylenol coupled with hydrocodone, I believe that this is dangerous to his liver. Therefore we agreed that we will try to do is use tramadol 50 mg 3 times a day on a scheduled basis and then hydrocodone/acetaminophen once a day for breakthrough pain. Both the patient and his wife are comfortable with this. The patient would then avoid any NSAIDs and any Tylenol. Await the  results of his renal function test prior to initiating this plan  12/01/15 Patient was recently seen at the [place]. Total cholesterol was 140. LDL was 72. HDL was 60. Hemoglobin A1c was 6.0. Creatinine was 1.22. Potassium was normal. CBC was within normal limits. Unfortunately blood pressure at home has been averaging [id number] systolic over [id number]. He denies any chest pain. He is having occasional wheezing. He is no longer taking losartan after he had hyperkalemia in January.  Past Medical History:  Diagnosis Date  . Arthritis    s/p TKR  . Bladder cancer (HCC)    [facility] low grade papillary urothileal carcinoma  . BPH (benign prostatic hyperplasia)   . Brugada syndrome    Possible Type II Brugada ECG pattern. No family history of SCD, no syncope, no tachypalpitations.  Marland Kitchen CAD (coronary artery disease)    a. LHC 1/15 - mid LCx 50 and 60%, proximal RCA 50%  . Chronic diastolic CHF (congestive heart failure) ([place])   . CKD (chronic kidney disease)   . COPD (chronic obstructive pulmonary disease) ([place])    Prior heavy smoker. PFTs (3/11): FVC 87%, FEV1 73%, ratio 0.57, DLCO 75%, TLC 121%. Moderate obstructive defect. PFTs (9/15): Only minimal obstruction, sugPrior heavy smoker. PFTs (3/11): FVC 87%, FEV1 73%, ratio 0.57, DLCO 75%, TLC 121%. Moderate obstructive defect. PFTs (9/15) minimal obstruction, poss asthma component   . Depression    with bipolar tendencies  . DOE (dyspnea on exertion)    a. Myoview 8/09- EF 57%, no ischemia // b. Myoview (3/11) EF 68%, diaphragmatic attenuation, no ischemia //  c Echo (4/11) EF 0000000, mild diastolic dysfunction, PASP 38 mmHg // d. PFTs 9/15 minimal obstruction  /  e. CT negative for ILD  . GERD (gastroesophageal reflux disease)   . History of Doppler ultrasound    Carotid US 3/11 negative for significant stenosis  //  carotid US 6/17: Mild bilateral plaque, 1-39% ICA  . History of echocardiogram    a. Echo 12/14 Inferior and distal septal HK, mild LVH, EF  50-55%, mild MR, mild LAE  //  b. Echo 6/17: Mild focal basal septal hypertrophy, EF 60-65%, normal wall motion, grade 1 diastolic dysfunction, mild AI  . HLD (hyperlipidemia)   . HTN (hypertension)   . Low testosterone   . Lung nodule    a. CT in 2015 >> PET in 12/15 not sugg of malignancy   . LVH (left ventricular hypertrophy)    a. Echo 12/14 Inferior and distal septal HK, mild LVH, EF 50-55%, mild MR, mild LAE  . Mild dementia   . Mitral regurgitation    Echo (4/11) with PISA ERO 0.3 cm^2 and regurgitant volume 41 mL (moderate MR). Echo (12/14) with mild MR.   . MS (multiple sclerosis) ([place])   . Right bundle branch block   . Stroke ([place])   . Thoracic aortic aneurysm (HCC)    4.1 cm 2017  .  Urinary retention with incomplete bladder emptying    receives botox injections and treatment for BPH through [facility]   Past Surgical History:  Procedure Laterality Date  . APPENDECTOMY    . EP IMPLANTABLE DEVICE N/A 06/23/2015   Procedure: Loop Recorder Insertion;  Surgeon: Deboraha Sprang, MD;  Location: [facility];  Service: Cardiovascular;  Laterality: N/A;  . HEMORRHOID SURGERY    . LEFT HEART CATHETERIZATION WITH CORONARY ANGIOGRAM N/A 01/05/2013   Procedure: LEFT HEART CATHETERIZATION WITH CORONARY ANGIOGRAM;  Surgeon: Larey Dresser, MD;  Location: [facility] CATH LAB;  Service: Cardiovascular;  Laterality: N/A;  . ROTATOR CUFF REPAIR    . TEE WITHOUT CARDIOVERSION N/A 06/23/2015   Procedure: TRANSESOPHAGEAL ECHOCARDIOGRAM (TEE);  Surgeon: Fay Records, MD;  Location: [facility] ENDOSCOPY;  Service: Cardiovascular;  Laterality: N/A;  . TONSILLECTOMY    . TOTAL KNEE ARTHROPLASTY Right   . TRANSURETHRAL RESECTION OF PROSTATE     Current Outpatient Prescriptions on File Prior to Visit  Medication Sig Dispense Refill  . acetaminophen (TYLENOL) 325 MG tablet Take 650 mg by mouth every 6 (six) hours as needed.    Marland Kitchen amantadine (SYMMETREL) 100 MG capsule TAKE 1 CAPSULE BY MOUTH TWICE DAILY WITH  BREAKFAST AND LUNCH 180 capsule 0  . amLODipine (NORVASC) 10 MG tablet Take 1 tablet (10 mg total) by mouth daily. 30 tablet 3  . ARIPiprazole (ABILIFY) 5 MG tablet Take 1 tablet (5 mg total) by mouth daily. 90 tablet 2  . atorvastatin (LIPITOR) 20 MG tablet TAKE 1 TABLET DAILY 90 tablet 3  . bisoprolol (ZEBETA) 5 MG tablet TAKE ONE-HALF (1/2) TABLET DAILY 45 tablet 3  . cholecalciferol (VITAMIN D) 400 units TABS tablet Take 400 Units by mouth.    . clopidogrel (PLAVIX) 75 MG tablet Take 1 tablet (75 mg total) by mouth daily. 90 tablet 2  . Cyanocobalamin (B-12) 1000 MCG CAPS Take 10,000 mcg by mouth daily.    . Cyanocobalamin (VITAMIN B-12 CR) 1000 MCG TBCR Take by mouth.    . folic acid (FOLVITE) 1 MG tablet Take 1 mg by mouth daily.    . furosemide (LASIX) 40 MG tablet Take 1 tablet (40 mg total) by mouth 2 (two) times daily. Please keep upcoming appointment for further refills 60 tablet 1  . HYDROcodone-acetaminophen (NORCO) 10-325 MG tablet Take 1 tablet by mouth every 6 (six) hours as needed for moderate pain or severe pain. 100 tablet 0  . losartan (COZAAR) 50 MG tablet Take 50 mg by mouth daily.    Marland Kitchen MAGNESIUM CITRATE PO Take by mouth.    Marland Kitchen MYRBETRIQ 25 MG TB24 tablet     . PROAIR HFA 108 (90 Base) MCG/ACT inhaler USE 1 TO 2 INHALATIONS EVERY 6 HOURS AS NEEDED FOR WHEEZING OR SHORTNESS OF BREATH 25.5 g 4  . rOPINIRole (REQUIP) 1 MG tablet TAKE 1 TABLET AT BEDTIME 90 tablet 3  . SPIRIVA HANDIHALER 18 MCG inhalation capsule INHALE THE CONTENTS OF 1 CAPSULE WITH 2         INHALATIONS ONCE DAILY AS DIRECTED 1 capsule 11  . UNABLE TO FIND Tumeric daily    . venlafaxine XR (EFFEXOR-XR) 150 MG 24 hr capsule TAKE 1 CAPSULE TWICE A DAY 180 capsule 3   No current facility-administered medications on file prior to visit.    Allergies  Allergen Reactions  . Acetylcholine     unknown  . Alcohol-Sulfur [Sulfur]     unknown  . Amitriptyline  unknown  . Bupivacaine     unknown  .  Clomipramine Hcl     unknown  . Cocaine     unknown  . Desipramine     unknown  . Ergonovine     unknown  . Flecainide     unknown  . Lithium     unknown  . Loxapine     unknown  . Nortriptyline     unknown  . Oxcarbazepine     unknown  . Procainamide     unknown  . Procaine     unknwon  . Propafenone     unknown  . Propofol     unknown  . Trifluoperazine     unknown   Social History   Social History  . Marital status: Married    Spouse name: N/A  . Number of children: N/A  . Years of education: N/A   Occupational History  . retired Retired   Social History Main Topics  . Smoking status: Former Smoker    Packs/day: 2.00    Years: 40.00    Types: Cigarettes    Quit date: 01/04/1993  . Smokeless tobacco: Former Systems developer    Types: Chew     Comment: patient still smokes a pipe daily  . Alcohol use No     Comment: 6 pack/day - 06/20/15 Pt states wuit drinking in the 1970's  . Drug use: No  . Sexual activity: Not on file   Other Topics Concern  . Not on file   Social History Narrative   Retired from the [employer] after 20 years   [place] Veteran      Review of Systems  All other systems reviewed and are negative.      Objective:   Physical Exam  Constitutional: He is oriented to person, place, and time.  Cardiovascular: Normal rate, regular rhythm and normal heart sounds.   Pulmonary/Chest: Effort normal and breath sounds normal. No respiratory distress. He has no wheezes. He has no rales.  Abdominal: Soft. Bowel sounds are normal.  Neurological: He is alert and oriented to person, place, and time. He has normal reflexes. No cranial nerve deficit. He exhibits normal muscle tone. Coordination normal.  Psychiatric: He has a normal mood and affect. His speech is normal and behavior is normal. Thought content normal. He is not slowed. Cognition and memory are normal.  Vitals reviewed.         Assessment & Plan:  Essential hypertension  CKD  (chronic kidney disease) stage 3, GFR 30-59 ml/min  Prediabetes  Renal insufficiency  Given his history of chronic kidney disease and hyperkalemia, I will avoid losartan. Although this would twice a day he'll given his prediabetes and history of chronic kidney disease, I am concerned about hyperkalemia. I will avoid hydrochlorthiazide for similar reasons. Therefore I'll increase his bisoprolol to 10 mg a day and recheck blood pressures in one month.

## 2015-12-02 ENCOUNTER — Other Ambulatory Visit: Payer: Self-pay | Admitting: Family Medicine

## 2015-12-02 ENCOUNTER — Telehealth: Payer: Self-pay | Admitting: Family Medicine

## 2015-12-02 ENCOUNTER — Encounter (HOSPITAL_COMMUNITY)
Admission: RE | Admit: 2015-12-02 | Discharge: 2015-12-02 | Disposition: A | Discharge status: Home / Self Care | Payer: Medicare Other | Source: Ambulatory Visit | Attending: Internal Medicine | Admitting: Internal Medicine

## 2015-12-02 VITALS — Wt 224.0 lb

## 2015-12-02 DIAGNOSIS — J439 Emphysema, unspecified: Secondary | ICD-10-CM

## 2015-12-02 NOTE — Progress Notes (Signed)
I have reviewed a Home Exercise Prescription with [name] [name] . [name] [name] is not currently exercising at home.  The patient was advised to walk 2-3 days a week for 30-45 minutes.  [name] [name] and I discussed how to progress their exercise prescription.  The patient stated that their goals were to lose 30 pounds.  The patient stated that they understand the exercise prescription.  We reviewed exercise guidelines, target heart rate during exercise, oxygen use, weather, home pulse oximeter, endpoints for exercise, and goals.  Patient is encouraged to come to me with any questions. I will continue to follow up with the patient to assist them with progression and safety.

## 2015-12-02 NOTE — Progress Notes (Signed)
[name] [name] 75 y.o. male Nutrition Note Spoke with pt. Pt is obese.  Pt eats 2 meals a day; most prepared at home. There are some ways the pt can make his eating habits healthier.  Pt's Rate Your Plate results reviewed with pt. Pt avoids many salty foods; does not use canned/ convenience food.  Pt adds salt to food at the table. The role of sodium in lung disease reviewed with pt. Pt is pre-diabetic according to his last A1c. Per discussion, pt wants to lose wt since "I've gained 30 lb over the past year." Pt expressed understanding of the information reviewed via feedback method.    Lab Results  Component Value Date   HGBA1C 6.3 (H) 06/26/2015   Nutrition Diagnosis ? Food-and nutrition-related knowledge deficit related to lack of exposure to information as related to diagnosis of pulmonary disease ? Obesity related to excessive energy intake as evidenced by a BMI of 31.0 Nutrition Intervention ? Pt's individual nutrition plan and goals reviewed with pt. ? Benefits of adopting healthy eating habits discussed when pt's Rate Your Plate reviewed. ? Pt to attend the Nutrition and Lung Disease class ? Continual client-centered nutrition education by RD, as part of interdisciplinary care. Goal(s) 1. Identify food quantities necessary to achieve wt loss of  -2# per week to a goal wt loss of 2.7-10.9 kg (6-24 lb) at graduation from pulmonary rehab. 2. Describe the benefit of including fruits, vegetables, whole grains, and low-fat dairy products in a healthy meal plan. Monitor and Evaluate progress toward nutrition goal with team.   Derek Mound, M.Ed, RD, LDN, CDE 12/02/2015 12:10 PM

## 2015-12-02 NOTE — Telephone Encounter (Signed)
Requesting a refill on Amantadine - Ok to refill??

## 2015-12-02 NOTE — Progress Notes (Signed)
Daily Session Note  Patient Details  Name: [name] [name] MRN: [id number] Date of Birth: January 29, 1940 Referring Provider:   April Manson Pulmonary Rehab Walk Test from 09/25/2015 in [place]  Referring Provider  Dr. Chase Caller      Encounter Date: 12/02/2015  Check In:     Session Check In - 12/02/15 1024      Check-In   Location [facility]   Staff Present Rosebud Poles, RN, BSN;Ronie Barnhart, MS, ACSM RCEP, Exercise Physiologist;Lisa Ysidro Evert, RN;Portia Rollene Rotunda, RN, BSN   Supervising physician immediately available to respond to emergencies [facility] immediately available   Physician(s) Dr. Cathlean Sauer   Medication changes reported     No   Fall or balance concerns reported    No   Warm-up and Cool-down Performed as group-led instruction   Resistance Training Performed Yes   VAD Patient? No     Pain Assessment   Currently in Pain? No/denies   Multiple Pain Sites No      Capillary Blood Glucose: No results found for this or any previous visit (from the past 24 hour(s)).      Exercise Prescription Changes - 12/02/15 1200      Response to Exercise   Blood Pressure (Admit) 124/64   Blood Pressure (Exercise) 148/74   Blood Pressure (Exit) 114/60   Heart Rate (Admit) 73 bpm   Heart Rate (Exercise) 79 bpm   Heart Rate (Exit) 69 bpm   Oxygen Saturation (Admit) 95 %   Oxygen Saturation (Exercise) 96 %   Oxygen Saturation (Exit) 95 %   Rating of Perceived Exertion (Exercise) 9   Perceived Dyspnea (Exercise) 1   Duration Progress to 45 minutes of aerobic exercise without signs/symptoms of physical distress   Intensity THRR unchanged     Progression   Progression Continue to progress workloads to maintain intensity without signs/symptoms of physical distress.     Resistance Training   Training Prescription Yes   Weight blue bands   Reps 10-12  10 minutes of strength training     Interval Training   Interval  Training No     Bike   Level 1.2   Minutes 34     NuStep   Level 6   Minutes 17   METs 2     Home Exercise Plan   Plans to continue exercise at [employer] (comment)   Frequency Add 3 additional days to program exercise sessions.     Goals Met:  Exercise tolerated well No report of cardiac concerns or symptoms Strength training completed today  Goals Unmet:  Not Applicable  Comments: Service time is from 10:30am to 12:00pm    Dr. Rush Farmer is Medical Director for Pulmonary Rehab at [facility].

## 2015-12-03 MED ORDER — AMANTADINE HCL 100 MG PO CAPS: ORAL_CAPSULE | ORAL | 2 refills | Status: DC

## 2015-12-03 NOTE — Telephone Encounter (Signed)
ok 

## 2015-12-03 NOTE — Telephone Encounter (Signed)
Medication called/sent to requested pharmacy  

## 2015-12-04 ENCOUNTER — Encounter (HOSPITAL_COMMUNITY)
Admission: RE | Admit: 2015-12-04 | Discharge: 2015-12-04 | Disposition: A | Discharge status: Home / Self Care | Payer: Medicare Other | Source: Ambulatory Visit | Attending: Internal Medicine | Admitting: Internal Medicine

## 2015-12-04 ENCOUNTER — Other Ambulatory Visit: Payer: Self-pay | Admitting: Family Medicine

## 2015-12-04 VITALS — Wt 221.8 lb

## 2015-12-04 DIAGNOSIS — J439 Emphysema, unspecified: Secondary | ICD-10-CM

## 2015-12-04 NOTE — Progress Notes (Signed)
Daily Session Note  Patient Details  Name: [name] [name] MRN: [id number] Date of Birth: Mar 26, 1940 Referring Provider:   April Manson Pulmonary Rehab Walk Test from 09/25/2015 in [place]  Referring Provider  Dr. Chase Caller      Encounter Date: 12/04/2015  Check In:     Session Check In - 12/04/15 1021      Check-In   Location [facility]   Staff Present Rosebud Poles, RN, BSN;Lisa Ysidro Evert, RN;Molly diVincenzo, MS, ACSM RCEP, Exercise Physiologist;Tallin Hart Rollene Rotunda, RN, BSN   Supervising physician immediately available to respond to emergencies [facility] immediately available   Physician(s) Dr. Ree Kida   Medication changes reported     No   Fall or balance concerns reported    No   Warm-up and Cool-down Performed as group-led instruction   Resistance Training Performed Yes   VAD Patient? No     Pain Assessment   Currently in Pain? No/denies   Multiple Pain Sites No      Capillary Blood Glucose: No results found for this or any previous visit (from the past 24 hour(s)).      Exercise Prescription Changes - 12/04/15 1223      Response to Exercise   Blood Pressure (Admit) 120/80   Blood Pressure (Exercise) 140/80   Blood Pressure (Exit) 120/60   Heart Rate (Admit) 63 bpm   Heart Rate (Exercise) 70 bpm   Heart Rate (Exit) 69 bpm   Oxygen Saturation (Admit) 96 %   Oxygen Saturation (Exercise) 96 %   Oxygen Saturation (Exit) 96 %   Rating of Perceived Exertion (Exercise) 13   Perceived Dyspnea (Exercise) 2   Duration Progress to 45 minutes of aerobic exercise without signs/symptoms of physical distress   Intensity THRR unchanged     Progression   Progression Continue to progress workloads to maintain intensity without signs/symptoms of physical distress.     Resistance Training   Training Prescription Yes   Weight blue bands   Reps 10-12  10 minutes of strength training     Interval Training   Interval  Training No     Bike   Level 1.2   Minutes 12     Track   Laps 13   Minutes 17     Goals Met:  Independence with exercise equipment Improved SOB with ADL's Using PLB without cueing & demonstrates good technique Exercise tolerated well No report of cardiac concerns or symptoms Strength training completed today  Goals Unmet:  Not Applicable  Comments: Service time is from 1030 to 1215   Dr. Rush Farmer is Medical Director for Pulmonary Rehab at [facility].

## 2015-12-09 ENCOUNTER — Encounter (HOSPITAL_COMMUNITY)
Admission: RE | Admit: 2015-12-09 | Discharge: 2015-12-09 | Disposition: A | Discharge status: Home / Self Care | Payer: Medicare Other | Source: Ambulatory Visit | Attending: Internal Medicine | Admitting: Internal Medicine

## 2015-12-09 VITALS — Wt 218.7 lb

## 2015-12-09 DIAGNOSIS — J439 Emphysema, unspecified: Secondary | ICD-10-CM | POA: Diagnosis not present

## 2015-12-09 NOTE — Progress Notes (Signed)
Pulmonary Individual Treatment Plan  Patient Details  Name: [name] [name] MRN: [id number] Date of Birth: 1940/03/16 Referring Provider:   April Manson Pulmonary Rehab Walk Test from 09/25/2015 in [place]  Referring Provider  Dr. Chase Caller      Initial Encounter Date:  Flowsheet Row Pulmonary Rehab Walk Test from 09/25/2015 in [place]  Date  09/25/15  Referring Provider  Dr. Chase Caller      Visit Diagnosis: Pulmonary emphysema, unspecified emphysema type ([place])  Patient's Home Medications on Admission:   Current Outpatient Prescriptions:  .  acetaminophen (TYLENOL) 325 MG tablet, Take 650 mg by mouth every 6 (six) hours as needed., Disp: , Rfl:  .  amantadine (SYMMETREL) 100 MG capsule, TAKE 1 CAPSULE BY MOUTH TWICE DAILY WITH BREAKFAST AND LUNCH, Disp: 180 capsule, Rfl: 2 .  amantadine (SYMMETREL) 100 MG capsule, TAKE 1 CAPSULE BY MOUTH TWICE DAILY WITH BREAKFAST AND LUNCH, Disp: 180 capsule, Rfl: 0 .  amLODipine (NORVASC) 10 MG tablet, Take 1 tablet (10 mg total) by mouth daily., Disp: 30 tablet, Rfl: 3 .  ARIPiprazole (ABILIFY) 5 MG tablet, Take 1 tablet (5 mg total) by mouth daily., Disp: 90 tablet, Rfl: 2 .  atorvastatin (LIPITOR) 20 MG tablet, TAKE 1 TABLET DAILY, Disp: 90 tablet, Rfl: 3 .  bisoprolol (ZEBETA) 10 MG tablet, Take 1 tablet (10 mg total) by mouth daily., Disp: 30 tablet, Rfl: 5 .  bisoprolol (ZEBETA) 5 MG tablet, TAKE ONE-HALF (1/2) TABLET DAILY (Patient taking differently: TAKE ONE TABLET DAILY), Disp: 45 tablet, Rfl: 3 .  cholecalciferol (VITAMIN D) 400 units TABS tablet, Take 400 Units by mouth., Disp: , Rfl:  .  clopidogrel (PLAVIX) 75 MG tablet, Take 1 tablet (75 mg total) by mouth daily., Disp: 90 tablet, Rfl: 2 .  Cyanocobalamin (B-12) 1000 MCG CAPS, Take 10,000 mcg by mouth daily., Disp: , Rfl:  .  Cyanocobalamin (VITAMIN B-12 CR) 1000 MCG TBCR, Take by mouth., Disp: , Rfl:  .  folic  acid (FOLVITE) 1 MG tablet, Take 1 mg by mouth daily., Disp: , Rfl:  .  furosemide (LASIX) 40 MG tablet, Take 1 tablet (40 mg total) by mouth 2 (two) times daily. Please keep upcoming appointment for further refills, Disp: 60 tablet, Rfl: 1 .  HYDROcodone-acetaminophen (NORCO) 10-325 MG tablet, Take 1 tablet by mouth every 6 (six) hours as needed for moderate pain or severe pain., Disp: 100 tablet, Rfl: 0 .  losartan (COZAAR) 50 MG tablet, Take 50 mg by mouth daily., Disp: , Rfl:  .  MAGNESIUM CITRATE PO, Take by mouth., Disp: , Rfl:  .  MYRBETRIQ 25 MG TB24 tablet, , Disp: , Rfl:  .  pantoprazole (PROTONIX) 40 MG tablet, Take 1 tablet (40 mg total) by mouth daily., Disp: 30 tablet, Rfl: 3 .  PROAIR HFA 108 (90 Base) MCG/ACT inhaler, USE 1 TO 2 INHALATIONS EVERY 6 HOURS AS NEEDED FOR WHEEZING OR SHORTNESS OF BREATH, Disp: 25.5 g, Rfl: 4 .  rOPINIRole (REQUIP) 1 MG tablet, TAKE 1 TABLET AT BEDTIME, Disp: 90 tablet, Rfl: 3 .  SPIRIVA HANDIHALER 18 MCG inhalation capsule, INHALE THE CONTENTS OF 1 CAPSULE WITH 2         INHALATIONS ONCE DAILY AS DIRECTED, Disp: 1 capsule, Rfl: 11 .  UNABLE TO FIND, Tumeric daily, Disp: , Rfl:  .  venlafaxine XR (EFFEXOR-XR) 150 MG 24 hr capsule, TAKE 1 CAPSULE TWICE A DAY, Disp: 180 capsule, Rfl: 3  Past Medical History: Past Medical History:  Diagnosis Date  . Arthritis    s/p TKR  . Bladder cancer (HCC)    [facility], [facility] low grade papillary urothileal carcinoma  . BPH (benign prostatic hyperplasia)   . Brugada syndrome    Possible Type II Brugada ECG pattern. No family history of SCD, no syncope, no tachypalpitations.  [place] CAD (coronary artery disease)    a. LHC 1/15 - mid LCx 50 and 60%, proximal RCA 50%  . Chronic diastolic CHF (congestive heart failure) ([place])   . CKD (chronic kidney disease)   . COPD (chronic obstructive pulmonary disease) ([place])    Prior heavy smoker. PFTs (3/11): FVC 87%, FEV1 73%, ratio 0.57, DLCO 75%, TLC 121%. Moderate obstructive defect.  PFTs (9/15): Only minimal obstruction, sugPrior heavy smoker. PFTs (3/11): FVC 87%, FEV1 73%, ratio 0.57, DLCO 75%, TLC 121%. Moderate obstructive defect. PFTs (9/15) minimal obstruction, poss asthma component   . Depression    with bipolar tendencies  . DOE (dyspnea on exertion)    a. Myoview 8/09- EF 57%, no ischemia // b. Myoview (3/11) EF 68%, diaphragmatic attenuation, no ischemia //  c Echo (4/11) EF 0000000, mild diastolic dysfunction, PASP 38 mmHg // d. PFTs 9/15 minimal obstruction  /  e. CT negative for ILD  . GERD (gastroesophageal reflux disease)   . History of Doppler ultrasound    Carotid US 3/11 negative for significant stenosis  //  carotid US 6/17: Mild bilateral plaque, 1-39% ICA  . History of echocardiogram    a. Echo 12/14 Inferior and distal septal HK, mild LVH, EF 50-55%, mild MR, mild LAE  //  b. Echo 6/17: Mild focal basal septal hypertrophy, EF 60-65%, normal wall motion, grade 1 diastolic dysfunction, mild AI  . HLD (hyperlipidemia)   . HTN (hypertension)   . Low testosterone   . Lung nodule    a. CT in 2015 >> PET in 12/15 not sugg of malignancy   . LVH (left ventricular hypertrophy)    a. Echo 12/14 Inferior and distal septal HK, mild LVH, EF 50-55%, mild MR, mild LAE  . Mild dementia   . Mitral regurgitation    Echo (4/11) with PISA ERO 0.3 cm^2 and regurgitant volume 41 mL (moderate MR). Echo (12/14) with mild MR.   . MS (multiple sclerosis) ([place])   . Right bundle branch block   . Stroke ([place])   . Thoracic aortic aneurysm (HCC)    4.1 cm 2017  . Urinary retention with incomplete bladder emptying    receives botox injections and treatment for BPH through [facility]    Tobacco Use: History  Smoking Status  . Former Smoker  . Packs/day: 2.00  . Years: 40.00  . Types: Cigarettes  . Quit date: 01/04/1993  Smokeless Tobacco  . Former Systems developer  . Types: Chew    Comment: patient still smokes a pipe daily    Labs: Recent Review Flowsheet Data    Labs for ITP  Cardiac and Pulmonary Rehab Latest Ref Rng & Units 03/31/2015 06/16/2015 06/20/2015 06/21/2015 06/26/2015   Cholestrol 0 - 200 mg/dL 158 185 - 140 -   LDLCALC 0 - 99 mg/dL 76 91 - 59 -   LDLDIRECT mg/dL - - - - -   HDL >40 mg/dL 48 43 - 33(L) -   Trlycerides <150 mg/dL 170(H) 256(H) - 239(H) -   Hemoglobin A1c <5.7 % 6.4(H) 6.4(H) - - 6.3(H)   TCO2 0 - 100 mmol/L - - 27 - -  Capillary Blood Glucose: Lab Results  Component Value Date   GLUCAP 80 06/20/2015   GLUCAP 123 (H) 06/15/2015   GLUCAP 94 12/11/2013     ADL UCSD:   Pulmonary Function Assessment:     Pulmonary Function Assessment - 09/19/15 1022      Breath   Bilateral Breath Sounds Clear   Shortness of Breath Yes;Limiting activity      Exercise Target Goals:    Exercise Program Goal: Individual exercise prescription set with THRR, safety & activity barriers. Participant demonstrates ability to understand and report RPE using BORG scale, to self-measure pulse accurately, and to acknowledge the importance of the exercise prescription.  Exercise Prescription Goal: Starting with aerobic activity 30 plus minutes a day, 3 days per week for initial exercise prescription. Provide home exercise prescription and guidelines that participant acknowledges understanding prior to discharge.  Activity Barriers & Risk Stratification:     Activity Barriers & Cardiac Risk Stratification - 09/19/15 1021      Activity Barriers & Cardiac Risk Stratification   Activity Barriers Shortness of Breath;Left Knee Replacement;Right Knee Replacement;Arthritis;Joint Problems;Muscular Weakness      6 Minute Walk:     6 Minute Walk    Row Name 09/25/15 1624         6 Minute Walk   Phase Initial     Distance 1100 feet     Walk Time 6 minutes     # of Rest Breaks 0     MPH 2.08     METS 2.53     RPE 12     Perceived Dyspnea  2     Symptoms Yes (comment)     Comments back ache     Resting HR 70 bpm     Resting BP 124/64      Max Ex. HR 97 bpm     Max Ex. BP 140/68       Interval HR   Baseline HR 70     1 Minute HR 80     2 Minute HR 91     3 Minute HR 97     4 Minute HR 87     5 Minute HR 96     6 Minute HR 96     2 Minute Post HR 74     Interval Heart Rate? Yes       Interval Oxygen   Interval Oxygen? Yes     Baseline Oxygen Saturation % 93 %     Baseline Liters of Oxygen 0 L     1 Minute Oxygen Saturation % 91 %     1 Minute Liters of Oxygen 0 L     2 Minute Oxygen Saturation % 92 %     2 Minute Liters of Oxygen 0 L     3 Minute Oxygen Saturation % 92 %     3 Minute Liters of Oxygen 0 L     4 Minute Oxygen Saturation % 93 %     4 Minute Liters of Oxygen 0 L     5 Minute Oxygen Saturation % 93 %     5 Minute Liters of Oxygen 0 L     6 Minute Oxygen Saturation % 93 %     6 Minute Liters of Oxygen 0 L     2 Minute Post Oxygen Saturation % 96 %     2 Minute Post Liters of Oxygen 0 L        Initial Exercise  Prescription:     Initial Exercise Prescription - 09/25/15 1600      Date of Initial Exercise RX and Referring Provider   Date 09/25/15   Referring Provider Dr. Chase Caller     Bike   Level 0.5   Minutes 17     NuStep   Level 2   Minutes 17   METs 1.5     Track   Laps 6   Minutes 17     Prescription Details   Frequency (times per week) 2   Duration Progress to 45 minutes of aerobic exercise without signs/symptoms of physical distress     Intensity   THRR 40-80% of Max Heartrate 59-118   Ratings of Perceived Exertion 11-13   Perceived Dyspnea 0-4     Progression   Progression Continue progressive overload as per policy without signs/symptoms or physical distress.     Resistance Training   Training Prescription Yes   Weight blue bands   Reps 10-12      Perform Capillary Blood Glucose checks as needed.  Exercise Prescription Changes:     Exercise Prescription Changes    Row Name 10/02/15 1200 10/07/15 1200 10/09/15 1200 10/14/15 1200 10/16/15 1200      Exercise Review   Progression  -  -  - Yes Yes     Response to Exercise   Blood Pressure (Admit) 120/60 106/60 118/70 120/60 110/50   Blood Pressure (Exercise) 150/70 132/80 138/78 130/70 116/60   Blood Pressure (Exit) 134/82 110/72 116/60 124/64 104/60   Heart Rate (Admit) 81 bpm 84 bpm 70 bpm 77 bpm 65 bpm   Heart Rate (Exercise) 95 bpm 87 bpm 92 bpm 90 bpm 83 bpm   Heart Rate (Exit) 87 bpm 69 bpm 76 bpm 72 bpm 73 bpm   Oxygen Saturation (Admit) 96 % 94 % 97 % 97 % 97 %   Oxygen Saturation (Exercise) 97 % 93 % 97 % 96 % 98 %   Oxygen Saturation (Exit) 97 % 95 % 96 % 97 % 96 %   Rating of Perceived Exertion (Exercise) 11 13 11 13 13    Perceived Dyspnea (Exercise) 0 2 0 1 1   Duration Progress to 45 minutes of aerobic exercise without signs/symptoms of physical distress Progress to 45 minutes of aerobic exercise without signs/symptoms of physical distress Progress to 45 minutes of aerobic exercise without signs/symptoms of physical distress Progress to 45 minutes of aerobic exercise without signs/symptoms of physical distress Progress to 45 minutes of aerobic exercise without signs/symptoms of physical distress   Intensity THRR unchanged THRR unchanged THRR unchanged THRR unchanged THRR unchanged     Progression   Progression Continue progressive overload as per policy without signs/symptoms or physical distress. Continue progressive overload as per policy without signs/symptoms or physical distress. Continue progressive overload as per policy without signs/symptoms or physical distress. Continue to progress workloads to maintain intensity without signs/symptoms of physical distress. Continue to progress workloads to maintain intensity without signs/symptoms of physical distress.     Resistance Training   Training Prescription Yes Yes Yes Yes Yes   Weight blue bands blue bands blue bands blue bands blue bands   Reps 10-12 10-12  10 minutesof strength training 10-12  10 minutesof strength  training 10-12  10 minutes of strength training 10-12  10 minutes of strength training     Interval Training   Interval Training No No No No No     Bike   Level 0.5 0.5 0.5  0.5 0.5   Minutes 17 17 17 17 17      NuStep   Level 2 2  - 3 4   Minutes 17 17  - 17 17   METs 2 1.9  - 1.9 2.3     Track   Laps  - 9 14 16   -   Minutes  - 17 17 17   -   Row Name 10/21/15 1200 10/30/15 1200 11/13/15 1300 11/18/15 1200 11/20/15 1200     Exercise Review   Progression Yes  -  - Yes Yes     Response to Exercise   Blood Pressure (Admit) 110/54 128/64 116/60 108/60 156/74   Blood Pressure (Exercise) 120/80 144/66 104/60 144/80 140/70   Blood Pressure (Exit) 130/62 138/74 120/70 118/64 118/64   Heart Rate (Admit) 65 bpm 87 bpm 75 bpm 98 bpm 87 bpm   Heart Rate (Exercise) 76 bpm 97 bpm 90 bpm 114 bpm 91 bpm   Heart Rate (Exit) 73 bpm 78 bpm 72 bpm 92 bpm 80 bpm   Oxygen Saturation (Admit) 97 % 90 % 95 % 98 % 98 %   Oxygen Saturation (Exercise) 95 % 95 % 95 % 96 % 97 %   Oxygen Saturation (Exit) 96 % 95 % 97 % 96 % 96 %   Rating of Perceived Exertion (Exercise) 11 9 9 9 11    Perceived Dyspnea (Exercise) 1 0 0 0 1   Duration Progress to 45 minutes of aerobic exercise without signs/symptoms of physical distress Progress to 45 minutes of aerobic exercise without signs/symptoms of physical distress Progress to 45 minutes of aerobic exercise without signs/symptoms of physical distress Progress to 45 minutes of aerobic exercise without signs/symptoms of physical distress Progress to 45 minutes of aerobic exercise without signs/symptoms of physical distress   Intensity THRR unchanged THRR unchanged THRR unchanged THRR unchanged THRR unchanged     Progression   Progression Continue to progress workloads to maintain intensity without signs/symptoms of physical distress. Continue to progress workloads to maintain intensity without signs/symptoms of physical distress. Continue to progress workloads to maintain  intensity without signs/symptoms of physical distress. Continue to progress workloads to maintain intensity without signs/symptoms of physical distress. Continue to progress workloads to maintain intensity without signs/symptoms of physical distress.     Resistance Training   Training Prescription Yes Yes Yes Yes Yes   Weight blue bands blue bands blue bands blue bands blue bands   Reps 10-12  10 minutes of strength training 10-12  10 minutes of strength training 10-12  10 minutes of strength training 10-12  10 minutes of strength training 10-12  10 minutes of strength training     Interval Training   Interval Training No No No No No     Bike   Level 0.8 0.9 0.8 1.2  -   Minutes 17 17 17 17   -     NuStep   Level 4 4 5 5 6    Minutes 17 17 17 17 17    METs 2.8 2.3 2.7 2.6 2.2     Track   Laps 19  -  - 13 13   Minutes 17  -  - 17 17   [place] Name 11/25/15 1207 12/02/15 1200 12/04/15 1223         Response to Exercise   Blood Pressure (Admit) 124/66 124/64 120/80     Blood Pressure (Exercise) 130/74 148/74 140/80     Blood Pressure (Exit) 112/60 114/60 120/60  Heart Rate (Admit) 68 bpm 73 bpm 63 bpm     Heart Rate (Exercise) 96 bpm 79 bpm 70 bpm     Heart Rate (Exit) 81 bpm 69 bpm 69 bpm     Oxygen Saturation (Admit) 98 % 95 % 96 %     Oxygen Saturation (Exercise) 95 % 96 % 96 %     Oxygen Saturation (Exit) 94 % 95 % 96 %     Rating of Perceived Exertion (Exercise) 13 9 13      Perceived Dyspnea (Exercise) 1 1 2      Duration Progress to 45 minutes of aerobic exercise without signs/symptoms of physical distress Progress to 45 minutes of aerobic exercise without signs/symptoms of physical distress Progress to 45 minutes of aerobic exercise without signs/symptoms of physical distress     Intensity THRR unchanged THRR unchanged THRR unchanged       Progression   Progression Continue to progress workloads to maintain intensity without signs/symptoms of physical distress. Continue  to progress workloads to maintain intensity without signs/symptoms of physical distress. Continue to progress workloads to maintain intensity without signs/symptoms of physical distress.       Resistance Training   Training Prescription Yes Yes Yes     Weight blue bands blue bands blue bands     Reps 10-12  10 minutes of strength training 10-12  10 minutes of strength training 10-12  10 minutes of strength training       Interval Training   Interval Training No No No       Bike   Level 1.2 1.2 1.2     Minutes 17 34 12       NuStep   Level 6 6  -     Minutes 17 17  -     METs 2.4 2  -       Track   Laps 10  - 13     Minutes 17  - 17       Home Exercise Plan   Plans to continue exercise at  [facility] (comment)  -     Frequency  - Add 3 additional days to program exercise sessions.  -        Exercise Comments:     Exercise Comments    Row Name 10/13/15 1019 11/10/15 1649 12/02/15 1324 12/08/15 1024     Exercise Comments Has only attended three sessions. Too soon to monitor progression.  Patient is slowly progressing. Patient is up to walking 19 laps in 15 minutes. Is currently out on vacation. Will cont. to monitor and progress when he returns to rehab.  Home exercise completed Patient is slowly progressing in program. Often needs to be encouraged to increase speed (due to forgetfullness). Will cont. to monitor and progress.       Discharge Exercise Prescription (Final Exercise Prescription Changes):     Exercise Prescription Changes - 12/04/15 1223      Response to Exercise   Blood Pressure (Admit) 120/80   Blood Pressure (Exercise) 140/80   Blood Pressure (Exit) 120/60   Heart Rate (Admit) 63 bpm   Heart Rate (Exercise) 70 bpm   Heart Rate (Exit) 69 bpm   Oxygen Saturation (Admit) 96 %   Oxygen Saturation (Exercise) 96 %   Oxygen Saturation (Exit) 96 %   Rating of Perceived Exertion (Exercise) 13   Perceived Dyspnea (Exercise) 2   Duration Progress  to 45 minutes of aerobic exercise without signs/symptoms of physical  distress   Intensity THRR unchanged     Progression   Progression Continue to progress workloads to maintain intensity without signs/symptoms of physical distress.     Resistance Training   Training Prescription Yes   Weight blue bands   Reps 10-12  10 minutes of strength training     Interval Training   Interval Training No     Bike   Level 1.2   Minutes 12     Track   Laps 13   Minutes 17       Nutrition:  Target Goals: Understanding of nutrition guidelines, daily intake of sodium 1500mg , cholesterol 200mg , calories 30% from fat and 7% or less from saturated fats, daily to have 5 or more servings of fruits and vegetables.  Biometrics:     Pre Biometrics - 09/19/15 1056      Pre Biometrics   Grip Strength 35 kg       Nutrition Therapy Plan and Nutrition Goals:     Nutrition Therapy & Goals - 12/02/15 1209      Nutrition Therapy   Diet Therapeutic Lifestyle Changes     Personal Nutrition Goals   Personal Goal #1 1-2 lb wt loss/week to a wt loss goal of 6-24 lb at graduation from [place], educate and counsel regarding individualized specific dietary modifications aiming towards targeted core components such as weight, hypertension, lipid management, diabetes, heart failure and other comorbidities.   Expected Outcomes Short Term Goal: Understand basic principles of dietary content, such as calories, fat, sodium, cholesterol and nutrients.;Long Term Goal: Adherence to prescribed nutrition plan.      Nutrition Discharge: Rate Your Plate Scores:     Nutrition Assessments - 12/02/15 1209      Rate Your Plate Scores   Pre Score 50      Psychosocial: Target Goals: Acknowledge presence or absence of depression, maximize coping skills, provide positive support system. Participant is able to verbalize types and ability to use techniques  and skills needed for reducing stress and depression.  Initial Review & Psychosocial Screening:     Initial Psych Review & Screening - 10/14/15 0712      Barriers   Psychosocial barriers to participate in program There are no identifiable barriers or psychosocial needs.      Quality of Life Scores:   PHQ-9: Recent Review Flowsheet Data    Depression screen [facility] 2/9 09/19/2015 07/04/2015 07/02/2015   Decreased Interest 0 3 0   Down, Depressed, Hopeless 0 3 0   PHQ - 2 Score 0 6 0   Altered sleeping - 2 -   Tired, decreased energy - 3 -   Change in appetite - 3 -   Feeling bad or failure about yourself  - 3 -   Trouble concentrating - 2 -   Moving slowly or fidgety/restless - 0 -   Suicidal thoughts - 0 -   PHQ-9 Score - 19 -   Difficult doing work/chores - Extremely dIfficult -      Psychosocial Evaluation and Intervention:     Psychosocial Evaluation - 10/14/15 0713      Psychosocial Evaluation & Interventions   Interventions Encouraged to exercise with the program and follow exercise prescription   Comments Initial review identified potential psychosocial barriers however patient has attended all exercise and education sessions since admission      Psychosocial Re-Evaluation:     Psychosocial Re-Evaluation    Row  Name 10/14/15 [id number] 11/10/15 1640 12/09/15 0856         Psychosocial Re-Evaluation   Interventions Encouraged to attend Pulmonary Rehabilitation for the exercise Encouraged to attend Pulmonary Rehabilitation for the exercise Encouraged to attend Pulmonary Rehabilitation for the exercise     Comments no psychosocial barriers to participation identified at this time no psychosocial barriers to participation identified at this time no psychosocial barriers to participation identified at this time       Education: Education Goals: Education classes will be provided on a weekly basis, covering required topics. Participant will state understanding/return  demonstration of topics presented.  Learning Barriers/Preferences:     Learning Barriers/Preferences - 09/19/15 1022      Learning Barriers/Preferences   Learning Barriers None   Learning Preferences Computer/Internet;Written Material;Skilled Demonstration;Individual Instruction;Group Instruction      Education Topics: Risk Factor Reduction:  -Group instruction that is supported by a PowerPoint presentation. Instructor discusses the definition of a risk factor, different risk factors for pulmonary disease, and how the heart and lungs work together.     Nutrition for Pulmonary Patient:  -Group instruction provided by PowerPoint slides, verbal discussion, and written materials to support subject matter. The instructor gives an explanation and review of healthy diet recommendations, which includes a discussion on weight management, recommendations for fruit and vegetable consumption, as well as protein, fluid, caffeine, fiber, sodium, sugar, and alcohol. Tips for eating when patients are short of breath are discussed. Flowsheet Row PULMONARY REHAB OTHER RESPIRATORY from 12/04/2015 in [place]  Date  11/13/15 [facility] Eating During the [place]  Educator  RD  Instruction Review Code  2- meets goals/outcomes      Pursed Lip Breathing:  -Group instruction that is supported by demonstration and informational handouts. Instructor discusses the benefits of pursed lip and diaphragmatic breathing and detailed demonstration on how to preform both.   Flowsheet Row PULMONARY REHAB OTHER RESPIRATORY from 12/04/2015 in [place]  Date  10/16/15  Educator  ep  Instruction Review Code  2- meets goals/outcomes      Oxygen Safety:  -Group instruction provided by PowerPoint, verbal discussion, and written material to support subject matter. There is an overview of "What is Oxygen" and "Why do we need it".  Instructor also reviews how  to create a safe environment for oxygen use, the importance of using oxygen as prescribed, and the risks of noncompliance. There is a brief discussion on traveling with oxygen and resources the patient may utilize.   Oxygen Equipment:  -Group instruction provided by [facility] Staff utilizing handouts, written materials, and equipment demonstrations. Flowsheet Row PULMONARY REHAB OTHER RESPIRATORY from 12/04/2015 in [place]  Date  11/20/15  Educator  rep  Instruction Review Code  2- meets goals/outcomes      Signs and Symptoms:  -Group instruction provided by written material and verbal discussion to support subject matter. Warning signs and symptoms of infection, stroke, and heart attack are reviewed and when to call the physician/911 reinforced. Tips for preventing the spread of infection discussed. Flowsheet Row PULMONARY REHAB OTHER RESPIRATORY from 12/04/2015 in [place]  Date  10/02/15  Educator  RN  Instruction Review Code  2- meets goals/outcomes      Advanced Directives:  -Group instruction provided by verbal instruction and written material to support subject matter. Instructor reviews Advanced Directive laws and proper instruction for filling out document.   Pulmonary  Video:  -Group video education that reviews the importance of medication and oxygen compliance, exercise, good nutrition, pulmonary hygiene, and pursed lip and diaphragmatic breathing for the pulmonary patient.   Exercise for the Pulmonary Patient:  -Group instruction that is supported by a PowerPoint presentation. Instructor discusses benefits of exercise, core components of exercise, frequency, duration, and intensity of an exercise routine, importance of utilizing pulse oximetry during exercise, safety while exercising, and options of places to exercise outside of rehab.   Flowsheet Row PULMONARY REHAB OTHER RESPIRATORY from 12/04/2015 in  [place]  Date  12/04/15  Educator  EP  Instruction Review Code  2- meets goals/outcomes      Pulmonary Medications:  -Verbally interactive group education provided by instructor with focus on inhaled medications and proper administration.   Anatomy and Physiology of the Respiratory System and Intimacy:  -Group instruction provided by PowerPoint, verbal discussion, and written material to support subject matter. Instructor reviews respiratory cycle and anatomical components of the respiratory system and their functions. Instructor also reviews differences in obstructive and restrictive respiratory diseases with examples of each. Intimacy, Sex, and Sexuality differences are reviewed with a discussion on how relationships can change when diagnosed with pulmonary disease. Common sexual concerns are reviewed.   Knowledge Questionnaire Score:   Core Components/Risk Factors/Patient Goals at Admission:     Personal Goals and Risk Factors at Admission - 09/19/15 1023      Core Components/Risk Factors/Patient Goals on Admission    Weight Management Yes;Obesity;Weight Loss   Intervention Weight Management/Obesity: Establish reasonable short term and long term weight goals.;Obesity: Provide education and appropriate resources to help participant work on and attain dietary goals.   Expected Outcomes Short Term: Continue to assess and modify interventions until short term weight is achieved;Long Term: Adherence to nutrition and physical activity/exercise program aimed toward attainment of established weight goal;Weight Loss: Understanding of general recommendations for a balanced deficit meal plan, which promotes 1-2 lb weight loss per week and includes a negative energy balance of [phone] kcal/d;Understanding recommendations for meals to include 15-35% energy as protein, 25-35% energy from fat, 35-60% energy from carbohydrates, less than 200mg  of dietary cholesterol,  20-35 gm of total fiber daily;Understanding of distribution of calorie intake throughout the day with the consumption of 4-5 meals/snacks   Sedentary Yes   Intervention Provide advice, education, support and counseling about physical activity/exercise needs.;Develop an individualized exercise prescription for aerobic and resistive training based on initial evaluation findings, risk stratification, comorbidities and participant's personal goals.   Expected Outcomes Achievement of increased cardiorespiratory fitness and enhanced flexibility, muscular endurance and strength shown through measurements of functional capacity and personal statement of participant.   Increase Strength and Stamina Yes   Intervention Provide advice, education, support and counseling about physical activity/exercise needs.;Develop an individualized exercise prescription for aerobic and resistive training based on initial evaluation findings, risk stratification, comorbidities and participant's personal goals.   Expected Outcomes Achievement of increased cardiorespiratory fitness and enhanced flexibility, muscular endurance and strength shown through measurements of functional capacity and personal statement of participant.   Tobacco Cessation Yes   Intervention Assist the participant in steps to quit. Provide individualized education and counseling about committing to Tobacco Cessation, relapse prevention, and pharmacological support that can be provided by physician.;Advice worker, assist with locating and accessing local/national Quit Smoking programs, and support quit date choice.   Expected Outcomes Short Term: Will demonstrate readiness to quit, by selecting a quit date.;Long Term: Complete abstinence  from all tobacco products for at least 12 months from quit date.;Short Term: Will quit all tobacco product use, adhering to prevention of relapse plan.   Improve shortness of breath with ADL's Yes   Intervention  Provide education, individualized exercise plan and daily activity instruction to help decrease symptoms of SOB with activities of daily living.   Expected Outcomes Short Term: Achieves a reduction of symptoms when performing activities of daily living.   Develop more efficient breathing techniques such as purse lipped breathing and diaphragmatic breathing; and practicing self-pacing with activity Yes   Intervention Provide education, demonstration and support about specific breathing techniuqes utilized for more efficient breathing. Include techniques such as pursed lipped breathing, diaphragmatic breathing and self-pacing activity.   Expected Outcomes Short Term: Participant will be able to demonstrate and use breathing techniques as needed throughout daily activities.      Core Components/Risk Factors/Patient Goals Review:      Goals and Risk Factor Review    Row Name 10/14/15 0706 11/10/15 1640 12/09/15 0856         Core Components/Risk Factors/Patient Goals Review   Personal Goals Review Increase Strength and Stamina;Sedentary;Weight Management/Obesity;Tobacco Cessation;Improve shortness of breath with ADL's;Develop more efficient breathing techniques such as purse lipped breathing and diaphragmatic breathing and practicing self-pacing with activity. Increase Strength and Stamina;Sedentary;Weight Management/Obesity;Tobacco Cessation;Improve shortness of breath with ADL's;Develop more efficient breathing techniques such as purse lipped breathing and diaphragmatic breathing and practicing self-pacing with activity. Increase Strength and Stamina;Sedentary;Weight Management/Obesity;Tobacco Cessation;Improve shortness of breath with ADL's;Develop more efficient breathing techniques such as purse lipped breathing and diaphragmatic breathing and practicing self-pacing with activity.     Review see "comments" section on ITP see "comments" section on ITP see "comments" section on ITP     Expected  Outcomes See "Admission" expected outcomes See "Admission" expected outcomes See "Admission" expected outcomes        Core Components/Risk Factors/Patient Goals at Discharge (Final Review):      Goals and Risk Factor Review - 12/09/15 0856      Core Components/Risk Factors/Patient Goals Review   Personal Goals Review Increase Strength and Stamina;Sedentary;Weight Management/Obesity;Tobacco Cessation;Improve shortness of breath with ADL's;Develop more efficient breathing techniques such as purse lipped breathing and diaphragmatic breathing and practicing self-pacing with activity.   Review see "comments" section on ITP   Expected Outcomes See "Admission" expected outcomes      ITP Comments:   Comments: ITP REVIEW Pt is making slow progress toward pulmonary rehab goals after completing 13 sessions. He continues to display memory deficits as evidenced by his inability to remember what equipment he has been assigned to and what his workload goals are on each peace. He has to be prompted to work harder. Recommend continued exercise, life style modification, education, and utilization of breathing techniques to increase stamina and strength and decrease shortness of breath with exertion.

## 2015-12-09 NOTE — Progress Notes (Signed)
Daily Session Note  Patient Details  Name: [name] [name] MRN: [id number] Date of Birth: 1940/02/26 Referring Provider:   April Manson Pulmonary Rehab Walk Test from 09/25/2015 in [place]  Referring Provider  Dr. Chase Caller      Encounter Date: 12/09/2015  Check In:     Session Check In - 12/09/15 1026      Check-In   Location [facility]   Staff Present Rosebud Poles, RN, BSN;Lisa Ysidro Evert, RN;Molly diVincenzo, MS, ACSM RCEP, Exercise Physiologist;Portia Rollene Rotunda, RN, BSN   Supervising physician immediately available to respond to emergencies [facility] immediately available   Physician(s) Dr. Ree Kida   Medication changes reported     No   Fall or balance concerns reported    No   Warm-up and Cool-down Performed as group-led instruction   Resistance Training Performed Yes   VAD Patient? No     Pain Assessment   Currently in Pain? No/denies   Multiple Pain Sites No      Capillary Blood Glucose: No results found for this or any previous visit (from the past 24 hour(s)).      Exercise Prescription Changes - 12/09/15 1200      Response to Exercise   Blood Pressure (Admit) 136/70   Blood Pressure (Exercise) 130/70   Blood Pressure (Exit) 106/60   Heart Rate (Admit) 84 bpm   Heart Rate (Exercise) 100 bpm   Heart Rate (Exit) 84 bpm   Oxygen Saturation (Admit) 97 %   Oxygen Saturation (Exercise) 95 %   Oxygen Saturation (Exit) 96 %   Rating of Perceived Exertion (Exercise) 11   Perceived Dyspnea (Exercise) 1   Duration Progress to 45 minutes of aerobic exercise without signs/symptoms of physical distress   Intensity THRR unchanged     Progression   Progression Continue to progress workloads to maintain intensity without signs/symptoms of physical distress.     Resistance Training   Training Prescription Yes   Weight blue bands   Reps 10-12  10 minutes of strength training     Interval Training   Interval  Training No     Bike   Level 1.2   Minutes 12     NuStep   Level 6   Minutes 17   METs 2.5     Track   Laps 14   Minutes 17     Goals Met:  Exercise tolerated well Strength training completed today  Goals Unmet:  Not Applicable  Comments: Service time is from 1030 to 1200    Dr. Rush Farmer is Medical Director for Pulmonary Rehab at [facility].

## 2015-12-11 ENCOUNTER — Encounter (HOSPITAL_COMMUNITY)
Admission: RE | Admit: 2015-12-11 | Discharge: 2015-12-11 | Disposition: A | Discharge status: Home / Self Care | Payer: Medicare Other | Source: Ambulatory Visit | Attending: Internal Medicine | Admitting: Internal Medicine

## 2015-12-11 VITALS — Wt 220.7 lb

## 2015-12-11 DIAGNOSIS — J439 Emphysema, unspecified: Secondary | ICD-10-CM

## 2015-12-11 NOTE — Progress Notes (Signed)
Daily Session Note  Patient Details  Name: [name] [name] MRN: [id number] Date of Birth: 1940/07/21 Referring Provider:   April Manson Pulmonary Rehab Walk Test from 09/25/2015 in [place]  Referring Provider  Dr. Chase Caller      Encounter Date: 12/11/2015  Check In:     Session Check In - 12/11/15 1102      Check-In   Location [facility]   Staff Present Rosebud Poles, RN, BSN;Lisa Ysidro Evert, RN;Molly diVincenzo, MS, ACSM RCEP, Exercise Physiologist;Portia Rollene Rotunda, RN, BSN   Supervising physician immediately available to respond to emergencies [facility] immediately available   Physician(s) Dr. Allyson Sabal   Medication changes reported     No   Fall or balance concerns reported    No   Warm-up and Cool-down Performed as group-led instruction   Resistance Training Performed Yes   VAD Patient? No     Pain Assessment   Currently in Pain? No/denies   Multiple Pain Sites No      Capillary Blood Glucose: No results found for this or any previous visit (from the past 24 hour(s)).      Exercise Prescription Changes - 12/11/15 1200      Response to Exercise   Blood Pressure (Admit) 136/70   Blood Pressure (Exercise) 146/74   Blood Pressure (Exit) 110/60   Heart Rate (Admit) 71 bpm   Heart Rate (Exercise) 75 bpm   Heart Rate (Exit) 73 bpm   Oxygen Saturation (Admit) 96 %   Oxygen Saturation (Exercise) 99 %   Oxygen Saturation (Exit) 96 %   Rating of Perceived Exertion (Exercise) 11   Perceived Dyspnea (Exercise) 1   Duration Progress to 45 minutes of aerobic exercise without signs/symptoms of physical distress   Intensity THRR unchanged     Progression   Progression Continue to progress workloads to maintain intensity without signs/symptoms of physical distress.     Resistance Training   Training Prescription Yes   Weight blue bands   Reps 10-12  10 minutes of strength training     Interval Training   Interval  Training No     Bike   Level 1.2   Minutes 12     Track   Laps 13   Minutes 17     Goals Met:  Exercise tolerated well Strength training completed today  Goals Unmet:  Not Applicable  Comments: Service time is from 1030 to 1215    Dr. Rush Farmer is Medical Director for Pulmonary Rehab at [facility].

## 2015-12-12 ENCOUNTER — Other Ambulatory Visit: Payer: Self-pay | Admitting: Family Medicine

## 2015-12-12 MED ORDER — FUROSEMIDE 40 MG PO TABS: 40.0000 mg | ORAL_TABLET | Freq: Two times a day (BID) | ORAL | 1 refills | Status: DC

## 2015-12-12 MED ORDER — PANTOPRAZOLE SODIUM 40 MG PO TBEC: 40.0000 mg | DELAYED_RELEASE_TABLET | Freq: Every day | ORAL | 3 refills | Status: DC

## 2015-12-15 ENCOUNTER — Other Ambulatory Visit: Payer: Self-pay | Admitting: Cardiology

## 2015-12-15 DIAGNOSIS — I10 Essential (primary) hypertension: Secondary | ICD-10-CM

## 2015-12-15 MED ORDER — AMLODIPINE BESYLATE 10 MG PO TABS: 10.0000 mg | ORAL_TABLET | Freq: Every day | ORAL | 3 refills | Status: DC

## 2015-12-16 ENCOUNTER — Encounter (HOSPITAL_COMMUNITY)
Admission: RE | Admit: 2015-12-16 | Discharge: 2015-12-16 | Disposition: A | Discharge status: Home / Self Care | Payer: Medicare Other | Source: Ambulatory Visit | Attending: Internal Medicine | Admitting: Internal Medicine

## 2015-12-16 VITALS — Wt 223.1 lb

## 2015-12-16 DIAGNOSIS — M25561 Pain in right knee: Secondary | ICD-10-CM | POA: Diagnosis not present

## 2015-12-16 DIAGNOSIS — J439 Emphysema, unspecified: Secondary | ICD-10-CM | POA: Diagnosis not present

## 2015-12-16 DIAGNOSIS — M25562 Pain in left knee: Secondary | ICD-10-CM | POA: Diagnosis not present

## 2015-12-16 NOTE — Progress Notes (Signed)
Daily Session Note  Patient Details  Name: [name] [name] MRN: [id number] Date of Birth: [birth year] Referring Provider:   April Manson Pulmonary Rehab Walk Test from 09/25/2015 in [place]  Referring Provider  Dr. Chase Caller      Encounter Date: 12/16/2015  Check In:     Session Check In - 12/16/15 1030      Check-In   [facility]   Staff Present Rosebud Poles, RN, BSN;Molly diVincenzo, MS, ACSM RCEP, Exercise Physiologist;Daje Stark Ysidro Evert, RN;Portia Rollene Rotunda, RN, BSN   Supervising physician immediately available to respond to emergencies [facility] immediately available   Physician(s) Dr. Nada Maclachlan   Medication changes reported     No   Fall or balance concerns reported    No   Warm-up and Cool-down Performed as group-led instruction   Resistance Training Performed Yes   VAD Patient? No     Pain Assessment   Currently in Pain? No/denies   Multiple Pain Sites No      Capillary Blood Glucose: No results found for this or any previous visit (from the past 24 hour(s)).      Exercise Prescription Changes - 12/16/15 1200      Exercise Review   Progression Yes     Response to Exercise   Blood Pressure (Admit) 134/64   Blood Pressure (Exercise) 144/74   Blood Pressure (Exit) 110/68   Heart Rate (Admit) 68 bpm   Heart Rate (Exercise) 78 bpm   Heart Rate (Exit) 64 bpm   Oxygen Saturation (Admit) 94 %   Oxygen Saturation (Exercise) 93 %   Oxygen Saturation (Exit) 93 %   Rating of Perceived Exertion (Exercise) 9   Perceived Dyspnea (Exercise) 1   Duration Progress to 45 minutes of aerobic exercise without signs/symptoms of physical distress   Intensity THRR unchanged     Progression   Progression Continue to progress workloads to maintain intensity without signs/symptoms of physical distress.     Resistance Training   Training Prescription Yes   Weight blue bands   Reps 10-12  10 minutes of strength  training     Interval Training   Interval Training No     Bike   Level 1.5   Minutes 17     NuStep   Level 5   Minutes 17   METs 1.7     Track   Laps 12   Minutes 17     Goals Met:  Exercise tolerated well No report of cardiac concerns or symptoms Strength training completed today  Goals Unmet:  Not Applicable  Comments: Service time is from 1030 to 1200    Dr. Rush Farmer is Medical Director for Pulmonary Rehab at [facility].

## 2015-12-17 ENCOUNTER — Ambulatory Visit
Admission: RE | Admit: 2015-12-17 | Discharge: 2015-12-17 | Disposition: A | Discharge status: Home / Self Care | Payer: Medicare Other | Source: Ambulatory Visit | Attending: Physical Medicine & Rehabilitation | Admitting: Physical Medicine & Rehabilitation

## 2015-12-17 ENCOUNTER — Other Ambulatory Visit: Payer: Self-pay | Admitting: Physical Medicine & Rehabilitation

## 2015-12-17 DIAGNOSIS — M19011 Primary osteoarthritis, right shoulder: Secondary | ICD-10-CM | POA: Diagnosis not present

## 2015-12-17 DIAGNOSIS — M19012 Primary osteoarthritis, left shoulder: Secondary | ICD-10-CM | POA: Diagnosis not present

## 2015-12-17 DIAGNOSIS — R52 Pain, unspecified: Secondary | ICD-10-CM

## 2015-12-18 ENCOUNTER — Encounter (HOSPITAL_COMMUNITY): Payer: Medicare Other

## 2015-12-18 DIAGNOSIS — M25561 Pain in right knee: Secondary | ICD-10-CM | POA: Diagnosis not present

## 2015-12-18 DIAGNOSIS — M25562 Pain in left knee: Secondary | ICD-10-CM | POA: Diagnosis not present

## 2015-12-22 DIAGNOSIS — M25561 Pain in right knee: Secondary | ICD-10-CM | POA: Diagnosis not present

## 2015-12-22 DIAGNOSIS — M25562 Pain in left knee: Secondary | ICD-10-CM | POA: Diagnosis not present

## 2015-12-23 ENCOUNTER — Encounter (HOSPITAL_COMMUNITY)
Admission: RE | Admit: 2015-12-23 | Discharge: 2015-12-23 | Disposition: A | Discharge status: Home / Self Care | Payer: Medicare Other | Source: Ambulatory Visit | Attending: Internal Medicine | Admitting: Internal Medicine

## 2015-12-23 VITALS — Wt 218.0 lb

## 2015-12-23 DIAGNOSIS — J439 Emphysema, unspecified: Secondary | ICD-10-CM | POA: Diagnosis not present

## 2015-12-23 NOTE — Progress Notes (Signed)
Daily Session Note  Patient Details  Name: [name] [name] MRN: [id number] Date of Birth: 09-25-1940 Referring Provider:   April Manson Pulmonary Rehab Walk Test from 09/25/2015 in [place]  Referring Provider  Dr. Chase Caller      Encounter Date: 12/23/2015  Check In:     Session Check In - 12/23/15 1209      Check-In   [facility]   Staff Present Su Hilt, MS, ACSM RCEP, Exercise Physiologist;Maria Whitaker, RN, Luisa Hart, RN, BSN;Ramon Dredge, RN, [facility]   Supervising physician immediately available to respond to emergencies [facility] immediately available   Physician(s) Dr. Carles Collet   Medication changes reported     No   Fall or balance concerns reported    No   Warm-up and Cool-down Performed as group-led instruction   Resistance Training Performed Yes   VAD Patient? No     Pain Assessment   Currently in Pain? No/denies   Multiple Pain Sites No      Capillary Blood Glucose: No results found for this or any previous visit (from the past 24 hour(s)).      Exercise Prescription Changes - 12/23/15 1200      Response to Exercise   Blood Pressure (Admit) 142/78   Blood Pressure (Exercise) 152/76   Blood Pressure (Exit) 132/70   Heart Rate (Admit) 84 bpm   Heart Rate (Exercise) 93 bpm   Heart Rate (Exit) 78 bpm   Oxygen Saturation (Admit) 96 %   Oxygen Saturation (Exercise) 95 %   Oxygen Saturation (Exit) 96 %   Rating of Perceived Exertion (Exercise) 9   Perceived Dyspnea (Exercise) 1   Duration Progress to 45 minutes of aerobic exercise without signs/symptoms of physical distress   Intensity THRR unchanged     Progression   Progression Continue to progress workloads to maintain intensity without signs/symptoms of physical distress.     Resistance Training   Training Prescription Yes   Weight blue bands   Reps 10-12  10 minutes of strength training     Interval Training   Interval Training No     Bike   Level 1.2   Minutes 17     NuStep   Level 6   Minutes 17   METs 2.7     Track   Laps 16   Minutes 17     Goals Met:  Exercise tolerated well No report of cardiac concerns or symptoms Strength training completed today  Goals Unmet:  Not Applicable  Comments: Service time is from 10:30am to 12:00pm    Dr. Rush Farmer is Medical Director for Pulmonary Rehab at [facility].

## 2015-12-25 ENCOUNTER — Encounter (HOSPITAL_COMMUNITY): Payer: Medicare Other

## 2015-12-30 ENCOUNTER — Encounter (HOSPITAL_COMMUNITY)
Admission: RE | Admit: 2015-12-30 | Discharge: 2015-12-30 | Disposition: A | Discharge status: Home / Self Care | Payer: Medicare Other | Source: Ambulatory Visit | Attending: Internal Medicine | Admitting: Internal Medicine

## 2015-12-30 VITALS — Wt 223.1 lb

## 2015-12-30 DIAGNOSIS — J439 Emphysema, unspecified: Secondary | ICD-10-CM | POA: Diagnosis not present

## 2015-12-30 NOTE — Progress Notes (Signed)
Daily Session Note  Patient Details  Name: [name] [name] MRN: [id number] Date of Birth: 01/12/1940 Referring Provider:   April Manson Pulmonary Rehab Walk Test from 09/25/2015 in [place]  Referring Provider  Dr. Chase Caller      Encounter Date: 12/30/2015  Check In:     Session Check In - 12/30/15 1030      Check-In   [facility]   Staff Present Rosebud Poles, RN, BSN;Ramon Dredge, RN, MHA;Idania Desouza Rollene Rotunda, RN, BSN   Supervising physician immediately available to respond to emergencies [facility] immediately available   Physician(s) Dr. Ree Kida   Medication changes reported     No   Fall or balance concerns reported    No   Warm-up and Cool-down Performed as group-led instruction   Resistance Training Performed Yes   VAD Patient? No     Pain Assessment   Currently in Pain? No/denies   Multiple Pain Sites No      Capillary Blood Glucose: No results found for this or any previous visit (from the past 24 hour(s)).      Exercise Prescription Changes - 12/30/15 1216      Response to Exercise   Blood Pressure (Admit) 120/66   Blood Pressure (Exercise) 160/78   Blood Pressure (Exit) 124/88   Heart Rate (Admit) 78 bpm   Heart Rate (Exercise) 97 bpm   Heart Rate (Exit) 84 bpm   Oxygen Saturation (Admit) 98 %   Oxygen Saturation (Exercise) 96 %   Oxygen Saturation (Exit) 95 %   Rating of Perceived Exertion (Exercise) 15   Perceived Dyspnea (Exercise) 2   Duration Progress to 45 minutes of aerobic exercise without signs/symptoms of physical distress   Intensity THRR unchanged     Progression   Progression Continue to progress workloads to maintain intensity without signs/symptoms of physical distress.     Resistance Training   Training Prescription Yes   Weight blue bands   Reps 10-12  10 minutes of strength training     Interval Training   Interval Training No     Bike   Level 1.2   Minutes 17     NuStep   Level 6   Minutes 17   METs 2.7     Track   Laps 10   Minutes 17     Goals Met:  Improved SOB with ADL's Using PLB without cueing & demonstrates good technique Exercise tolerated well No report of cardiac concerns or symptoms Strength training completed today  Goals Unmet:  Not Applicable  Comments: Service time is from 1030 to 1200   Dr. Rush Farmer is Medical Director for Pulmonary Rehab at [facility].

## 2016-01-01 ENCOUNTER — Encounter (HOSPITAL_COMMUNITY)
Admission: RE | Admit: 2016-01-01 | Discharge: 2016-01-01 | Disposition: A | Discharge status: Home / Self Care | Payer: Medicare Other | Source: Ambulatory Visit | Attending: Internal Medicine | Admitting: Internal Medicine

## 2016-01-01 VITALS — Wt 222.2 lb

## 2016-01-01 DIAGNOSIS — J439 Emphysema, unspecified: Secondary | ICD-10-CM

## 2016-01-01 NOTE — Progress Notes (Signed)
Pulmonary Individual Treatment Plan  Patient Details  Name: [name] [name] MRN: [id number] Date of Birth: July 14, 1940 Referring Provider:   April Manson Pulmonary Rehab Walk Test from 09/25/2015 in [facility]  Referring Provider  Dr. Chase Caller      Initial Encounter Date:  Flowsheet Row Pulmonary Rehab Walk Test from 09/25/2015 in [facility]  Date  09/25/15  Referring Provider  Dr. Chase Caller      Visit Diagnosis: Pulmonary emphysema, unspecified emphysema type ([place])  Patient's Home Medications on Admission:   Current Outpatient Prescriptions:  .  acetaminophen (TYLENOL) 325 MG tablet, Take 650 mg by mouth every 6 (six) hours as needed., Disp: , Rfl:  .  amantadine (SYMMETREL) 100 MG capsule, TAKE 1 CAPSULE BY MOUTH TWICE DAILY WITH BREAKFAST AND LUNCH, Disp: 180 capsule, Rfl: 2 .  amantadine (SYMMETREL) 100 MG capsule, TAKE 1 CAPSULE BY MOUTH TWICE DAILY WITH BREAKFAST AND LUNCH, Disp: 180 capsule, Rfl: 0 .  amLODipine (NORVASC) 10 MG tablet, Take 1 tablet (10 mg total) by mouth daily., Disp: 90 tablet, Rfl: 3 .  ARIPiprazole (ABILIFY) 5 MG tablet, Take 1 tablet (5 mg total) by mouth daily., Disp: 90 tablet, Rfl: 2 .  atorvastatin (LIPITOR) 20 MG tablet, TAKE 1 TABLET DAILY, Disp: 90 tablet, Rfl: 3 .  bisoprolol (ZEBETA) 10 MG tablet, Take 1 tablet (10 mg total) by mouth daily., Disp: 30 tablet, Rfl: 5 .  bisoprolol (ZEBETA) 5 MG tablet, TAKE ONE-HALF (1/2) TABLET DAILY (Patient taking differently: TAKE ONE TABLET DAILY), Disp: 45 tablet, Rfl: 3 .  cholecalciferol (VITAMIN D) 400 units TABS tablet, Take 400 Units by mouth., Disp: , Rfl:  .  clopidogrel (PLAVIX) 75 MG tablet, Take 1 tablet (75 mg total) by mouth daily., Disp: 90 tablet, Rfl: 2 .  Cyanocobalamin (B-12) 1000 MCG CAPS, Take 10,000 mcg by mouth daily., Disp: , Rfl:  .  Cyanocobalamin (VITAMIN B-12 CR) 1000 MCG TBCR, Take by mouth., Disp: , Rfl:  .  folic  acid (FOLVITE) 1 MG tablet, Take 1 mg by mouth daily., Disp: , Rfl:  .  furosemide (LASIX) 40 MG tablet, Take 1 tablet (40 mg total) by mouth 2 (two) times daily., Disp: 180 tablet, Rfl: 1 .  HYDROcodone-acetaminophen (NORCO) 10-325 MG tablet, Take 1 tablet by mouth every 6 (six) hours as needed for moderate pain or severe pain., Disp: 100 tablet, Rfl: 0 .  losartan (COZAAR) 50 MG tablet, Take 50 mg by mouth daily., Disp: , Rfl:  .  MAGNESIUM CITRATE PO, Take by mouth., Disp: , Rfl:  .  MYRBETRIQ 25 MG TB24 tablet, , Disp: , Rfl:  .  pantoprazole (PROTONIX) 40 MG tablet, Take 1 tablet (40 mg total) by mouth daily., Disp: 90 tablet, Rfl: 3 .  PROAIR HFA 108 (90 Base) MCG/ACT inhaler, USE 1 TO 2 INHALATIONS EVERY 6 HOURS AS NEEDED FOR WHEEZING OR SHORTNESS OF BREATH, Disp: 25.5 g, Rfl: 4 .  rOPINIRole (REQUIP) 1 MG tablet, TAKE 1 TABLET AT BEDTIME, Disp: 90 tablet, Rfl: 3 .  SPIRIVA HANDIHALER 18 MCG inhalation capsule, INHALE THE CONTENTS OF 1 CAPSULE WITH 2         INHALATIONS ONCE DAILY AS DIRECTED, Disp: 1 capsule, Rfl: 11 .  UNABLE TO FIND, Tumeric daily, Disp: , Rfl:  .  venlafaxine XR (EFFEXOR-XR) 150 MG 24 hr capsule, TAKE 1 CAPSULE TWICE A DAY, Disp: 180 capsule, Rfl: 3  Past Medical History: Past Medical History:  Diagnosis Date  . Arthritis    s/p TKR  . Bladder cancer (HCC)    [facility], [facility] low grade papillary urothileal carcinoma  . BPH (benign prostatic hyperplasia)   . Brugada syndrome    Possible Type II Brugada ECG pattern. No family history of SCD, no syncope, no tachypalpitations.  [place] CAD (coronary artery disease)    a. LHC 1/15 - mid LCx 50 and 60%, proximal RCA 50%  . Chronic diastolic CHF (congestive heart failure) ([place])   . CKD (chronic kidney disease)   . COPD (chronic obstructive pulmonary disease) ([place])    Prior heavy smoker. PFTs (3/11): FVC 87%, FEV1 73%, ratio 0.57, DLCO 75%, TLC 121%. Moderate obstructive defect. PFTs (9/15): Only minimal obstruction, sugPrior heavy  smoker. PFTs (3/11): FVC 87%, FEV1 73%, ratio 0.57, DLCO 75%, TLC 121%. Moderate obstructive defect. PFTs (9/15) minimal obstruction, poss asthma component   . Depression    with bipolar tendencies  . DOE (dyspnea on exertion)    a. Myoview 8/09- EF 57%, no ischemia // b. Myoview (3/11) EF 68%, diaphragmatic attenuation, no ischemia //  c Echo (4/11) EF 0000000, mild diastolic dysfunction, PASP 38 mmHg // d. PFTs 9/15 minimal obstruction  /  e. CT negative for ILD  . GERD (gastroesophageal reflux disease)   . History of Doppler ultrasound    Carotid US 3/11 negative for significant stenosis  //  carotid US 6/17: Mild bilateral plaque, 1-39% ICA  . History of echocardiogram    a. Echo 12/14 Inferior and distal septal HK, mild LVH, EF 50-55%, mild MR, mild LAE  //  b. Echo 6/17: Mild focal basal septal hypertrophy, EF 60-65%, normal wall motion, grade 1 diastolic dysfunction, mild AI  . HLD (hyperlipidemia)   . HTN (hypertension)   . Low testosterone   . Lung nodule    a. CT in 2015 >> PET in 12/15 not sugg of malignancy   . LVH (left ventricular hypertrophy)    a. Echo 12/14 Inferior and distal septal HK, mild LVH, EF 50-55%, mild MR, mild LAE  . Mild dementia   . Mitral regurgitation    Echo (4/11) with PISA ERO 0.3 cm^2 and regurgitant volume 41 mL (moderate MR). Echo (12/14) with mild MR.   . MS (multiple sclerosis) ([place])   . Right bundle branch block   . Stroke ([place])   . Thoracic aortic aneurysm (HCC)    4.1 cm 2017  . Urinary retention with incomplete bladder emptying    receives botox injections and treatment for BPH through [facility]    Tobacco Use: History  Smoking Status  . Former Smoker  . Packs/day: 2.00  . Years: 40.00  . Types: Cigarettes  . Quit date: 01/04/1993  Smokeless Tobacco  . Former Systems developer  . Types: Chew    Comment: patient still smokes a pipe daily    Labs: Recent Review Flowsheet Data    Labs for ITP Cardiac and Pulmonary Rehab Latest Ref Rng & Units  03/31/2015 06/16/2015 06/20/2015 06/21/2015 06/26/2015   Cholestrol 0 - 200 mg/dL 158 185 - 140 -   LDLCALC 0 - 99 mg/dL 76 91 - 59 -   LDLDIRECT mg/dL - - - - -   HDL >40 mg/dL 48 43 - 33(L) -   Trlycerides <150 mg/dL 170(H) 256(H) - 239(H) -   Hemoglobin A1c <5.7 % 6.4(H) 6.4(H) - - 6.3(H)   TCO2 0 - 100 mmol/L - - 27 - -      Capillary  Blood Glucose: Lab Results  Component Value Date   GLUCAP 80 06/20/2015   GLUCAP 123 (H) 06/15/2015   GLUCAP 94 12/11/2013     ADL UCSD:   Pulmonary Function Assessment:     Pulmonary Function Assessment - 09/19/15 1022      Breath   Bilateral Breath Sounds Clear   Shortness of Breath Yes;Limiting activity      Exercise Target Goals:    Exercise Program Goal: Individual exercise prescription set with THRR, safety & activity barriers. Participant demonstrates ability to understand and report RPE using BORG scale, to self-measure pulse accurately, and to acknowledge the importance of the exercise prescription.  Exercise Prescription Goal: Starting with aerobic activity 30 plus minutes a day, 3 days per week for initial exercise prescription. Provide home exercise prescription and guidelines that participant acknowledges understanding prior to discharge.  Activity Barriers & Risk Stratification:     Activity Barriers & Cardiac Risk Stratification - 09/19/15 1021      Activity Barriers & Cardiac Risk Stratification   Activity Barriers Shortness of Breath;Left Knee Replacement;Right Knee Replacement;Arthritis;Joint Problems;Muscular Weakness      6 Minute Walk:     6 Minute Walk    Row Name 09/25/15 1624         6 Minute Walk   Phase Initial     Distance 1100 feet     Walk Time 6 minutes     # of Rest Breaks 0     MPH 2.08     METS 2.53     RPE 12     Perceived Dyspnea  2     Symptoms Yes (comment)     Comments back ache     Resting HR 70 bpm     Resting BP 124/64     Max Ex. HR 97 bpm     Max Ex. BP 140/68        Interval HR   Baseline HR 70     1 Minute HR 80     2 Minute HR 91     3 Minute HR 97     4 Minute HR 87     5 Minute HR 96     6 Minute HR 96     2 Minute Post HR 74     Interval Heart Rate? Yes       Interval Oxygen   Interval Oxygen? Yes     Baseline Oxygen Saturation % 93 %     Baseline Liters of Oxygen 0 L     1 Minute Oxygen Saturation % 91 %     1 Minute Liters of Oxygen 0 L     2 Minute Oxygen Saturation % 92 %     2 Minute Liters of Oxygen 0 L     3 Minute Oxygen Saturation % 92 %     3 Minute Liters of Oxygen 0 L     4 Minute Oxygen Saturation % 93 %     4 Minute Liters of Oxygen 0 L     5 Minute Oxygen Saturation % 93 %     5 Minute Liters of Oxygen 0 L     6 Minute Oxygen Saturation % 93 %     6 Minute Liters of Oxygen 0 L     2 Minute Post Oxygen Saturation % 96 %     2 Minute Post Liters of Oxygen 0 L        Initial Exercise Prescription:  Initial Exercise Prescription - 09/25/15 1600      Date of Initial Exercise RX and Referring Provider   Date 09/25/15   Referring Provider Dr. Chase Caller     Bike   Level 0.5   Minutes 17     NuStep   Level 2   Minutes 17   METs 1.5     Track   Laps 6   Minutes 17     Prescription Details   Frequency (times per week) 2   Duration Progress to 45 minutes of aerobic exercise without signs/symptoms of physical distress     Intensity   THRR 40-80% of Max Heartrate 59-118   Ratings of Perceived Exertion 11-13   Perceived Dyspnea 0-4     Progression   Progression Continue progressive overload as per policy without signs/symptoms or physical distress.     Resistance Training   Training Prescription Yes   Weight blue bands   Reps 10-12      Perform Capillary Blood Glucose checks as needed.  Exercise Prescription Changes:     Exercise Prescription Changes    Row Name 10/02/15 1200 10/07/15 1200 10/09/15 1200 10/14/15 1200 10/16/15 1200     Exercise Review   Progression  -  -  - Yes Yes      Response to Exercise   Blood Pressure (Admit) 120/60 106/60 118/70 120/60 110/50   Blood Pressure (Exercise) 150/70 132/80 138/78 130/70 116/60   Blood Pressure (Exit) 134/82 110/72 116/60 124/64 104/60   Heart Rate (Admit) 81 bpm 84 bpm 70 bpm 77 bpm 65 bpm   Heart Rate (Exercise) 95 bpm 87 bpm 92 bpm 90 bpm 83 bpm   Heart Rate (Exit) 87 bpm 69 bpm 76 bpm 72 bpm 73 bpm   Oxygen Saturation (Admit) 96 % 94 % 97 % 97 % 97 %   Oxygen Saturation (Exercise) 97 % 93 % 97 % 96 % 98 %   Oxygen Saturation (Exit) 97 % 95 % 96 % 97 % 96 %   Rating of Perceived Exertion (Exercise) 11 13 11 13 13    Perceived Dyspnea (Exercise) 0 2 0 1 1   Duration Progress to 45 minutes of aerobic exercise without signs/symptoms of physical distress Progress to 45 minutes of aerobic exercise without signs/symptoms of physical distress Progress to 45 minutes of aerobic exercise without signs/symptoms of physical distress Progress to 45 minutes of aerobic exercise without signs/symptoms of physical distress Progress to 45 minutes of aerobic exercise without signs/symptoms of physical distress   Intensity THRR unchanged THRR unchanged THRR unchanged THRR unchanged THRR unchanged     Progression   Progression Continue progressive overload as per policy without signs/symptoms or physical distress. Continue progressive overload as per policy without signs/symptoms or physical distress. Continue progressive overload as per policy without signs/symptoms or physical distress. Continue to progress workloads to maintain intensity without signs/symptoms of physical distress. Continue to progress workloads to maintain intensity without signs/symptoms of physical distress.     Resistance Training   Training Prescription Yes Yes Yes Yes Yes   Weight blue bands blue bands blue bands blue bands blue bands   Reps 10-12 10-12  10 minutesof strength training 10-12  10 minutesof strength training 10-12  10 minutes of strength training 10-12   10 minutes of strength training     Interval Training   Interval Training No No No No No     Bike   Level 0.5 0.5 0.5 0.5 0.5   Minutes  17 17 17 17 17      NuStep   Level 2 2  - 3 4   Minutes 17 17  - 17 17   METs 2 1.9  - 1.9 2.3     Track   Laps  - 9 14 16   -   Minutes  - 17 17 17   -   Row Name 10/21/15 1200 10/30/15 1200 11/13/15 1300 11/18/15 1200 11/20/15 1200     Exercise Review   Progression Yes  -  - Yes Yes     Response to Exercise   Blood Pressure (Admit) 110/54 128/64 116/60 108/60 156/74   Blood Pressure (Exercise) 120/80 144/66 104/60 144/80 140/70   Blood Pressure (Exit) 130/62 138/74 120/70 118/64 118/64   Heart Rate (Admit) 65 bpm 87 bpm 75 bpm 98 bpm 87 bpm   Heart Rate (Exercise) 76 bpm 97 bpm 90 bpm 114 bpm 91 bpm   Heart Rate (Exit) 73 bpm 78 bpm 72 bpm 92 bpm 80 bpm   Oxygen Saturation (Admit) 97 % 90 % 95 % 98 % 98 %   Oxygen Saturation (Exercise) 95 % 95 % 95 % 96 % 97 %   Oxygen Saturation (Exit) 96 % 95 % 97 % 96 % 96 %   Rating of Perceived Exertion (Exercise) 11 9 9 9 11    Perceived Dyspnea (Exercise) 1 0 0 0 1   Duration Progress to 45 minutes of aerobic exercise without signs/symptoms of physical distress Progress to 45 minutes of aerobic exercise without signs/symptoms of physical distress Progress to 45 minutes of aerobic exercise without signs/symptoms of physical distress Progress to 45 minutes of aerobic exercise without signs/symptoms of physical distress Progress to 45 minutes of aerobic exercise without signs/symptoms of physical distress   Intensity THRR unchanged THRR unchanged THRR unchanged THRR unchanged THRR unchanged     Progression   Progression Continue to progress workloads to maintain intensity without signs/symptoms of physical distress. Continue to progress workloads to maintain intensity without signs/symptoms of physical distress. Continue to progress workloads to maintain intensity without signs/symptoms of physical distress.  Continue to progress workloads to maintain intensity without signs/symptoms of physical distress. Continue to progress workloads to maintain intensity without signs/symptoms of physical distress.     Resistance Training   Training Prescription Yes Yes Yes Yes Yes   Weight blue bands blue bands blue bands blue bands blue bands   Reps 10-12  10 minutes of strength training 10-12  10 minutes of strength training 10-12  10 minutes of strength training 10-12  10 minutes of strength training 10-12  10 minutes of strength training     Interval Training   Interval Training No No No No No     Bike   Level 0.8 0.9 0.8 1.2  -   Minutes 17 17 17 17   -     NuStep   Level 4 4 5 5 6    Minutes 17 17 17 17 17    METs 2.8 2.3 2.7 2.6 2.2     Track   Laps 19  -  - 13 13   Minutes 17  -  - [place] Name 11/25/15 1207 12/02/15 1200 12/04/15 1223 12/09/15 1200 12/11/15 1200     Response to Exercise   Blood Pressure (Admit) 124/66 124/64 120/80 136/70 136/70   Blood Pressure (Exercise) 130/74 148/74 140/80 130/70 146/74   Blood Pressure (Exit) 112/60 114/60 120/60 106/60 110/60   Heart Rate (Admit)  68 bpm 73 bpm 63 bpm 84 bpm 71 bpm   Heart Rate (Exercise) 96 bpm 79 bpm 70 bpm 100 bpm 75 bpm   Heart Rate (Exit) 81 bpm 69 bpm 69 bpm 84 bpm 73 bpm   Oxygen Saturation (Admit) 98 % 95 % 96 % 97 % 96 %   Oxygen Saturation (Exercise) 95 % 96 % 96 % 95 % 99 %   Oxygen Saturation (Exit) 94 % 95 % 96 % 96 % 96 %   Rating of Perceived Exertion (Exercise) 13 9 13 11 11    Perceived Dyspnea (Exercise) 1 1 2 1 1    Duration Progress to 45 minutes of aerobic exercise without signs/symptoms of physical distress Progress to 45 minutes of aerobic exercise without signs/symptoms of physical distress Progress to 45 minutes of aerobic exercise without signs/symptoms of physical distress Progress to 45 minutes of aerobic exercise without signs/symptoms of physical distress Progress to 45 minutes of aerobic exercise  without signs/symptoms of physical distress   Intensity THRR unchanged THRR unchanged THRR unchanged THRR unchanged THRR unchanged     Progression   Progression Continue to progress workloads to maintain intensity without signs/symptoms of physical distress. Continue to progress workloads to maintain intensity without signs/symptoms of physical distress. Continue to progress workloads to maintain intensity without signs/symptoms of physical distress. Continue to progress workloads to maintain intensity without signs/symptoms of physical distress. Continue to progress workloads to maintain intensity without signs/symptoms of physical distress.     Resistance Training   Training Prescription Yes Yes Yes Yes Yes   Weight blue bands blue bands blue bands blue bands blue bands   Reps 10-12  10 minutes of strength training 10-12  10 minutes of strength training 10-12  10 minutes of strength training 10-12  10 minutes of strength training 10-12  10 minutes of strength training     Interval Training   Interval Training No No No No No     Bike   Level 1.2 1.2 1.2 1.2 1.2   Minutes 17 34 12 12 12      NuStep   Level 6 6  - 6  -   Minutes 17 17  - 17  -   METs 2.4 2  - 2.5  -     Track   Laps 10  - 13 14 13    Minutes 17  - 17 17 17      Home Exercise Plan   Plans to continue exercise at  [facility] (comment)  -  -  -   Frequency  - Add 3 additional days to program exercise sessions.  -  -  -   Row Name 12/16/15 1200 12/23/15 1200 12/30/15 1216         Exercise Review   Progression Yes  -  -       Response to Exercise   Blood Pressure (Admit) 134/64 142/78 120/66     Blood Pressure (Exercise) 144/74 152/76 160/78     Blood Pressure (Exit) 110/68 132/70 124/88     Heart Rate (Admit) 68 bpm 84 bpm 78 bpm     Heart Rate (Exercise) 78 bpm 93 bpm 97 bpm     Heart Rate (Exit) 64 bpm 78 bpm 84 bpm     Oxygen Saturation (Admit) 94 % 96 % 98 %     Oxygen Saturation (Exercise) 93  % 95 % 96 %     Oxygen Saturation (Exit) 93 % 96 % 95 %  Rating of Perceived Exertion (Exercise) 9 9 15      Perceived Dyspnea (Exercise) 1 1 2      Duration Progress to 45 minutes of aerobic exercise without signs/symptoms of physical distress Progress to 45 minutes of aerobic exercise without signs/symptoms of physical distress Progress to 45 minutes of aerobic exercise without signs/symptoms of physical distress     Intensity THRR unchanged THRR unchanged THRR unchanged       Progression   Progression Continue to progress workloads to maintain intensity without signs/symptoms of physical distress. Continue to progress workloads to maintain intensity without signs/symptoms of physical distress. Continue to progress workloads to maintain intensity without signs/symptoms of physical distress.       Resistance Training   Training Prescription Yes Yes Yes     Weight blue bands blue bands blue bands     Reps 10-12  10 minutes of strength training 10-12  10 minutes of strength training 10-12  10 minutes of strength training       Interval Training   Interval Training No No No       Bike   Level 1.5 1.2 1.2     Minutes 17 17 17        NuStep   Level 5 6 6      Minutes 17 17 17      METs 1.7 2.7 2.7       Track   Laps 12 16 10      Minutes 17 17 17         Exercise Comments:     Exercise Comments    Row Name 10/13/15 1019 11/10/15 1649 12/02/15 1324 12/08/15 1024 12/23/15 0814   Exercise Comments Has only attended three sessions. Too soon to monitor progression.  Patient is slowly progressing. Patient is up to walking 19 laps in 15 minutes. Is currently out on vacation. Will cont. to monitor and progress when he returns to rehab.  Home exercise completed Patient is slowly progressing in program. Often needs to be encouraged to increase speed (due to forgetfullness). Will cont. to monitor and progress. Patient is slowly progressing in program. Often needs to be encouraged to increase  speed (due to forgetfullness). Will cont. to monitor and progress.      Discharge Exercise Prescription (Final Exercise Prescription Changes):     Exercise Prescription Changes - 12/30/15 1216      Response to Exercise   Blood Pressure (Admit) 120/66   Blood Pressure (Exercise) 160/78   Blood Pressure (Exit) 124/88   Heart Rate (Admit) 78 bpm   Heart Rate (Exercise) 97 bpm   Heart Rate (Exit) 84 bpm   Oxygen Saturation (Admit) 98 %   Oxygen Saturation (Exercise) 96 %   Oxygen Saturation (Exit) 95 %   Rating of Perceived Exertion (Exercise) 15   Perceived Dyspnea (Exercise) 2   Duration Progress to 45 minutes of aerobic exercise without signs/symptoms of physical distress   Intensity THRR unchanged     Progression   Progression Continue to progress workloads to maintain intensity without signs/symptoms of physical distress.     Resistance Training   Training Prescription Yes   Weight blue bands   Reps 10-12  10 minutes of strength training     Interval Training   Interval Training No     Bike   Level 1.2   Minutes 17     NuStep   Level 6   Minutes 17   METs 2.7     Track   Laps 10  Minutes 17       Nutrition:  Target Goals: Understanding of nutrition guidelines, daily intake of sodium 1500mg , cholesterol 200mg , calories 30% from fat and 7% or less from saturated fats, daily to have 5 or more servings of fruits and vegetables.  Biometrics:     Pre Biometrics - 09/19/15 1056      Pre Biometrics   Grip Strength 35 kg       Nutrition Therapy Plan and Nutrition Goals:     Nutrition Therapy & Goals - 12/02/15 1209      Nutrition Therapy   Diet Therapeutic Lifestyle Changes     Personal Nutrition Goals   Personal Goal #1 1-2 lb wt loss/week to a wt loss goal of 6-24 lb at graduation from [place], educate and counsel regarding individualized specific dietary modifications aiming towards  targeted core components such as weight, hypertension, lipid management, diabetes, heart failure and other comorbidities.   Expected Outcomes Short Term Goal: Understand basic principles of dietary content, such as calories, fat, sodium, cholesterol and nutrients.;Long Term Goal: Adherence to prescribed nutrition plan.      Nutrition Discharge: Rate Your Plate Scores:     Nutrition Assessments - 12/02/15 1209      Rate Your Plate Scores   Pre Score 50      Psychosocial: Target Goals: Acknowledge presence or absence of depression, maximize coping skills, provide positive support system. Participant is able to verbalize types and ability to use techniques and skills needed for reducing stress and depression.  Initial Review & Psychosocial Screening:     Initial Psych Review & Screening - 10/14/15 0712      Barriers   Psychosocial barriers to participate in program There are no identifiable barriers or psychosocial needs.      Quality of Life Scores:   PHQ-9: Recent Review Flowsheet Data    Depression screen [facility] 2/9 09/19/2015 07/04/2015 07/02/2015   Decreased Interest 0 3 0   Down, Depressed, Hopeless 0 3 0   PHQ - 2 Score 0 6 0   Altered sleeping - 2 -   Tired, decreased energy - 3 -   Change in appetite - 3 -   Feeling bad or failure about yourself  - 3 -   Trouble concentrating - 2 -   Moving slowly or fidgety/restless - 0 -   Suicidal thoughts - 0 -   PHQ-9 Score - 19 -   Difficult doing work/chores - Extremely dIfficult -      Psychosocial Evaluation and Intervention:     Psychosocial Evaluation - 10/14/15 0713      Psychosocial Evaluation & Interventions   Interventions Encouraged to exercise with the program and follow exercise prescription   Comments Initial review identified potential psychosocial barriers however patient has attended all exercise and education sessions since admission      Psychosocial Re-Evaluation:     Psychosocial Re-Evaluation     Row Name 10/14/15 0714 11/10/15 1640 12/09/15 0856 12/30/15 0835       Psychosocial Re-Evaluation   Interventions Encouraged to attend Pulmonary Rehabilitation for the exercise Encouraged to attend Pulmonary Rehabilitation for the exercise Encouraged to attend Pulmonary Rehabilitation for the exercise Encouraged to attend Pulmonary Rehabilitation for the exercise    Comments no psychosocial barriers to participation identified at this time no psychosocial barriers to participation identified at this time no psychosocial barriers to participation identified at this time no psychosocial barriers  to participation identified at this time      Education: Education Goals: Education classes will be provided on a weekly basis, covering required topics. Participant will state understanding/return demonstration of topics presented.  Learning Barriers/Preferences:     Learning Barriers/Preferences - 09/19/15 1022      Learning Barriers/Preferences   Learning Barriers None   Learning Preferences Computer/Internet;Written Material;Skilled Demonstration;Individual Instruction;Group Instruction      Education Topics: Risk Factor Reduction:  -Group instruction that is supported by a PowerPoint presentation. Instructor discusses the definition of a risk factor, different risk factors for pulmonary disease, and how the heart and lungs work together.     Nutrition for Pulmonary Patient:  -Group instruction provided by PowerPoint slides, verbal discussion, and written materials to support subject matter. The instructor gives an explanation and review of healthy diet recommendations, which includes a discussion on weight management, recommendations for fruit and vegetable consumption, as well as protein, fluid, caffeine, fiber, sodium, sugar, and alcohol. Tips for eating when patients are short of breath are discussed. Flowsheet Row PULMONARY REHAB OTHER RESPIRATORY from 12/11/2015 in [place]  Date  11/13/15 [facility] Ms Eating During the [place]  Educator  RD  Instruction Review Code  2- meets goals/outcomes      Pursed Lip Breathing:  -Group instruction that is supported by demonstration and informational handouts. Instructor discusses the benefits of pursed lip and diaphragmatic breathing and detailed demonstration on how to preform both.   Flowsheet Row PULMONARY REHAB OTHER RESPIRATORY from 12/11/2015 in [place]  Date  10/16/15  Educator  ep  Instruction Review Code  2- meets goals/outcomes      Oxygen Safety:  -Group instruction provided by PowerPoint, verbal discussion, and written material to support subject matter. There is an overview of "What is Oxygen" and "Why do we need it".  Instructor also reviews how to create a safe environment for oxygen use, the importance of using oxygen as prescribed, and the risks of noncompliance. There is a brief discussion on traveling with oxygen and resources the patient may utilize.   Oxygen Equipment:  -Group instruction provided by [facility] Staff utilizing handouts, written materials, and equipment demonstrations. Flowsheet Row PULMONARY REHAB OTHER RESPIRATORY from 12/11/2015 in [place]  Date  11/20/15  Educator  rep  Instruction Review Code  2- meets goals/outcomes      Signs and Symptoms:  -Group instruction provided by written material and verbal discussion to support subject matter. Warning signs and symptoms of infection, stroke, and heart attack are reviewed and when to call the physician/911 reinforced. Tips for preventing the spread of infection discussed. Flowsheet Row PULMONARY REHAB OTHER RESPIRATORY from 12/11/2015 in [place]  Date  10/02/15  Educator  RN  Instruction Review Code  2- meets goals/outcomes      Advanced Directives:  -Group instruction provided by verbal instruction  and written material to support subject matter. Instructor reviews Advanced Directive laws and proper instruction for filling out document.   Pulmonary Video:  -Group video education that reviews the importance of medication and oxygen compliance, exercise, good nutrition, pulmonary hygiene, and pursed lip and diaphragmatic breathing for the pulmonary patient.   Exercise for the Pulmonary Patient:  -Group instruction that is supported by a PowerPoint presentation. Instructor discusses benefits of exercise, core components of exercise, frequency, duration, and intensity of an exercise routine, importance of utilizing pulse oximetry during exercise, safety  while exercising, and options of places to exercise outside of rehab.   Flowsheet Row PULMONARY REHAB OTHER RESPIRATORY from 12/11/2015 in [place]  Date  12/04/15  Educator  EP  Instruction Review Code  2- meets goals/outcomes      Pulmonary Medications:  -Verbally interactive group education provided by instructor with focus on inhaled medications and proper administration.   Anatomy and Physiology of the Respiratory System and Intimacy:  -Group instruction provided by PowerPoint, verbal discussion, and written material to support subject matter. Instructor reviews respiratory cycle and anatomical components of the respiratory system and their functions. Instructor also reviews differences in obstructive and restrictive respiratory diseases with examples of each. Intimacy, Sex, and Sexuality differences are reviewed with a discussion on how relationships can change when diagnosed with pulmonary disease. Common sexual concerns are reviewed.   Knowledge Questionnaire Score:   Core Components/Risk Factors/Patient Goals at Admission:     Personal Goals and Risk Factors at Admission - 09/19/15 1023      Core Components/Risk Factors/Patient Goals on Admission    Weight Management Yes;Obesity;Weight Loss    Intervention Weight Management/Obesity: Establish reasonable short term and long term weight goals.;Obesity: Provide education and appropriate resources to help participant work on and attain dietary goals.   Expected Outcomes Short Term: Continue to assess and modify interventions until short term weight is achieved;Long Term: Adherence to nutrition and physical activity/exercise program aimed toward attainment of established weight goal;Weight Loss: Understanding of general recommendations for a balanced deficit meal plan, which promotes 1-2 lb weight loss per week and includes a negative energy balance of [phone] kcal/d;Understanding recommendations for meals to include 15-35% energy as protein, 25-35% energy from fat, 35-60% energy from carbohydrates, less than 200mg  of dietary cholesterol, 20-35 gm of total fiber daily;Understanding of distribution of calorie intake throughout the day with the consumption of 4-5 meals/snacks   Sedentary Yes   Intervention Provide advice, education, support and counseling about physical activity/exercise needs.;Develop an individualized exercise prescription for aerobic and resistive training based on initial evaluation findings, risk stratification, comorbidities and participant's personal goals.   Expected Outcomes Achievement of increased cardiorespiratory fitness and enhanced flexibility, muscular endurance and strength shown through measurements of functional capacity and personal statement of participant.   Increase Strength and Stamina Yes   Intervention Provide advice, education, support and counseling about physical activity/exercise needs.;Develop an individualized exercise prescription for aerobic and resistive training based on initial evaluation findings, risk stratification, comorbidities and participant's personal goals.   Expected Outcomes Achievement of increased cardiorespiratory fitness and enhanced flexibility, muscular endurance and strength shown  through measurements of functional capacity and personal statement of participant.   Tobacco Cessation Yes   Intervention Assist the participant in steps to quit. Provide individualized education and counseling about committing to Tobacco Cessation, relapse prevention, and pharmacological support that can be provided by physician.;Advice worker, assist with locating and accessing local/national Quit Smoking programs, and support quit date choice.   Expected Outcomes Short Term: Will demonstrate readiness to quit, by selecting a quit date.;Long Term: Complete abstinence from all tobacco products for at least 12 months from quit date.;Short Term: Will quit all tobacco product use, adhering to prevention of relapse plan.   Improve shortness of breath with ADL's Yes   Intervention Provide education, individualized exercise plan and daily activity instruction to help decrease symptoms of SOB with activities of daily living.   Expected Outcomes Short Term: Achieves a reduction of symptoms when performing activities  of daily living.   Develop more efficient breathing techniques such as purse lipped breathing and diaphragmatic breathing; and practicing self-pacing with activity Yes   Intervention Provide education, demonstration and support about specific breathing techniuqes utilized for more efficient breathing. Include techniques such as pursed lipped breathing, diaphragmatic breathing and self-pacing activity.   Expected Outcomes Short Term: Participant will be able to demonstrate and use breathing techniques as needed throughout daily activities.      Core Components/Risk Factors/Patient Goals Review:      Goals and Risk Factor Review    Row Name 10/14/15 0706 11/10/15 1640 12/09/15 0856 12/30/15 0835       Core Components/Risk Factors/Patient Goals Review   Personal Goals Review Increase Strength and Stamina;Sedentary;Weight Management/Obesity;Tobacco Cessation;Improve shortness of  breath with ADL's;Develop more efficient breathing techniques such as purse lipped breathing and diaphragmatic breathing and practicing self-pacing with activity. Increase Strength and Stamina;Sedentary;Weight Management/Obesity;Tobacco Cessation;Improve shortness of breath with ADL's;Develop more efficient breathing techniques such as purse lipped breathing and diaphragmatic breathing and practicing self-pacing with activity. Increase Strength and Stamina;Sedentary;Weight Management/Obesity;Tobacco Cessation;Improve shortness of breath with ADL's;Develop more efficient breathing techniques such as purse lipped breathing and diaphragmatic breathing and practicing self-pacing with activity. Increase Strength and Stamina;Sedentary;Weight Management/Obesity;Tobacco Cessation;Improve shortness of breath with ADL's;Develop more efficient breathing techniques such as purse lipped breathing and diaphragmatic breathing and practicing self-pacing with activity.    Review see "comments" section on ITP see "comments" section on ITP see "comments" section on ITP see "comments" section on ITP    Expected Outcomes See "Admission" expected outcomes See "Admission" expected outcomes See "Admission" expected outcomes See "Admission" expected outcomes       Core Components/Risk Factors/Patient Goals at Discharge (Final Review):      Goals and Risk Factor Review - 12/30/15 0835      Core Components/Risk Factors/Patient Goals Review   Personal Goals Review Increase Strength and Stamina;Sedentary;Weight Management/Obesity;Tobacco Cessation;Improve shortness of breath with ADL's;Develop more efficient breathing techniques such as purse lipped breathing and diaphragmatic breathing and practicing self-pacing with activity.   Review see "comments" section on ITP   Expected Outcomes See "Admission" expected outcomes      ITP Comments:   Comments: ITP REVIEW Pt is making some expected progress toward pulmonary rehab  goals after completing 18 sessions. He is still struggling with weight gain. He is not exercising at home which may be related to his memory issues. He remains somewhat sedentary according to his wife. She is in the program and is hopeful after the holidays they can both begin exercising at home. Recommend continued exercise, life style modification, education, and utilization of breathing techniques to increase stamina and strength and decrease shortness of breath with exertion.

## 2016-01-01 NOTE — Progress Notes (Signed)
Daily Session Note  Patient Details  Name: STOKES RATTIGAN MRN: [id number] Date of Birth: 03/16/1940 Referring Provider:   April Manson Pulmonary Rehab Walk Test from 09/25/2015 in [place]  Referring Provider  Dr. Chase Caller      Encounter Date: 01/01/2016  Check In:     Session Check In - 01/01/16 1030      Check-In   Location [facility]   Staff Present Trish Fountain, RN, BSN;Ramon Dredge, RN, MHA;Joan Leonia Reeves, RN, Roque Cash, RN   Supervising physician immediately available to respond to emergencies [facility] immediately available   Physician(s) Dr. Wynetta Emery   Medication changes reported     No   Fall or balance concerns reported    No   Warm-up and Cool-down Performed as group-led instruction   Resistance Training Performed Yes   VAD Patient? No     Pain Assessment   Currently in Pain? No/denies   Multiple Pain Sites No      Capillary Blood Glucose: No results found for this or any previous visit (from the past 24 hour(s)).      Exercise Prescription Changes - 01/01/16 1227      Response to Exercise   Blood Pressure (Admit) 110/54   Blood Pressure (Exercise) 118/70   Blood Pressure (Exit) 110/70   Heart Rate (Admit) 73 bpm   Heart Rate (Exercise) 73 bpm   Heart Rate (Exit) 70 bpm   Oxygen Saturation (Admit) 93 %   Oxygen Saturation (Exercise) 95 %   Oxygen Saturation (Exit) 95 %   Rating of Perceived Exertion (Exercise) 7   Perceived Dyspnea (Exercise) 1   Duration Progress to 45 minutes of aerobic exercise without signs/symptoms of physical distress   Intensity THRR unchanged     Progression   Progression Continue to progress workloads to maintain intensity without signs/symptoms of physical distress.     Resistance Training   Training Prescription Yes   Weight blue bands   Reps 10-12  10 minutes of strength training     Interval Training   Interval Training No     Bike   Level 1.2   Minutes 17     NuStep   Level 6   Minutes 10     Goals Met:  No report of cardiac concerns or symptoms Strength training completed today  Goals Unmet:  RPE  Comments: Marden Noble was extremely lethargic today. Slept during education. Eyes closed on nu-step. When questioned he stated he took 3 pain pills this am prior to coming to rehab. Wife at side (she is also in the program). She stated she was unaware that he took pain medication. She also stated she will have to regulate his medications related to memory loss. Educated patient regarding side effects of taking too many narcotic pain pills at one time. Wife verbalized she would remove narcotics from patients reach. Patient discharge from daily session early into wife's care.   Dr. Rush Farmer is Medical Director for Pulmonary Rehab at [facility].

## 2016-01-06 ENCOUNTER — Encounter (HOSPITAL_COMMUNITY): Payer: Self-pay

## 2016-01-06 ENCOUNTER — Telehealth (HOSPITAL_COMMUNITY): Payer: Self-pay | Admitting: Family Medicine

## 2016-01-06 ENCOUNTER — Encounter (HOSPITAL_COMMUNITY): Payer: Medicare Other

## 2016-01-06 NOTE — Telephone Encounter (Signed)
Pt called to cancel due to personal reasons.... [name] [name] °

## 2016-01-07 DIAGNOSIS — M25519 Pain in unspecified shoulder: Secondary | ICD-10-CM | POA: Diagnosis not present

## 2016-01-08 ENCOUNTER — Encounter (HOSPITAL_COMMUNITY)
Admission: RE | Admit: 2016-01-08 | Discharge: 2016-01-08 | Disposition: A | Discharge status: Home / Self Care | Payer: Medicare Other | Source: Ambulatory Visit | Attending: Internal Medicine | Admitting: Internal Medicine

## 2016-01-08 VITALS — Wt 224.2 lb

## 2016-01-08 DIAGNOSIS — J439 Emphysema, unspecified: Secondary | ICD-10-CM

## 2016-01-08 NOTE — Progress Notes (Signed)
Daily Session Note  Patient Details  Name: [name] [name] MRN: [id number] Date of Birth: 1940/09/21 Referring Provider:   April Manson Pulmonary Rehab Walk Test from 09/25/2015 in [place]  Referring Provider  Dr. Chase Caller      Encounter Date: 01/08/2016  Check In:     Session Check In - 01/08/16 1025      Check-In   Location [facility]   Staff Present Su Hilt, MS, ACSM RCEP, Exercise Physiologist;Joan Leonia Reeves, RN, Luisa Hart, RN, Roque Cash, RN   Supervising physician immediately available to respond to emergencies [facility] immediately available   Physician(s) Dr. Tana Coast   Medication changes reported     No   Fall or balance concerns reported    No   Warm-up and Cool-down Performed as group-led instruction   Resistance Training Performed Yes   VAD Patient? No     Pain Assessment   Currently in Pain? No/denies   Multiple Pain Sites No      Capillary Blood Glucose: No results found for this or any previous visit (from the past 24 hour(s)).      Exercise Prescription Changes - 01/08/16 1236      Exercise Review   Progression Yes     Response to Exercise   Blood Pressure (Admit) 140/66   Blood Pressure (Exercise) 142/80   Blood Pressure (Exit) 124/70   Heart Rate (Admit) 73 bpm   Heart Rate (Exercise) 78 bpm   Heart Rate (Exit) 65 bpm   Oxygen Saturation (Admit) 91 %   Oxygen Saturation (Exercise) 93 %   Oxygen Saturation (Exit) 95 %   Rating of Perceived Exertion (Exercise) 7   Perceived Dyspnea (Exercise) 1   Duration Progress to 45 minutes of aerobic exercise without signs/symptoms of physical distress   Intensity THRR unchanged     Progression   Progression Continue to progress workloads to maintain intensity without signs/symptoms of physical distress.     Resistance Training   Training Prescription Yes   Weight blue bands   Reps 10-12  10 minutes of strength training     Interval Training   Interval Training No     Bike   Level 1.5   Minutes 17     NuStep   Level 6   Minutes 10   METs 1.8     Goals Met:  Improved SOB with ADL's Queuing for purse lip breathing No report of cardiac concerns or symptoms Strength training completed today  Goals Unmet:  Not Applicable  Comments: Service time is from 1030 to 1230   Dr. Rush Farmer is Medical Director for Pulmonary Rehab at [facility].

## 2016-01-13 ENCOUNTER — Encounter (HOSPITAL_COMMUNITY)
Admission: RE | Admit: 2016-01-13 | Discharge: 2016-01-13 | Disposition: A | Discharge status: Home / Self Care | Payer: Medicare Other | Source: Ambulatory Visit | Attending: Internal Medicine | Admitting: Internal Medicine

## 2016-01-13 VITALS — Wt 224.9 lb

## 2016-01-13 DIAGNOSIS — J439 Emphysema, unspecified: Secondary | ICD-10-CM

## 2016-01-13 NOTE — Progress Notes (Signed)
Daily Session Note  Patient Details  Name: [name] [name] MRN: [id number] Date of Birth: 1940-12-30 Referring Provider:   April Manson Pulmonary Rehab Walk Test from 09/25/2015 in [place]  Referring Provider  Dr. Chase Caller      Encounter Date: 01/13/2016  Check In:     Session Check In - 01/13/16 1032      Check-In   Location [facility]   Staff Present Rosebud Poles, RN, BSN;Takeru Bose, MS, ACSM RCEP, Exercise Physiologist;Lisa Ysidro Evert, RN;Portia Rollene Rotunda, RN, BSN   Supervising physician immediately available to respond to emergencies [facility] immediately available   Physician(s) Dr. Tana Coast   Medication changes reported     No   Fall or balance concerns reported    No   Warm-up and Cool-down Performed as group-led instruction   Resistance Training Performed Yes   VAD Patient? No     Pain Assessment   Currently in Pain? No/denies   Multiple Pain Sites No      Capillary Blood Glucose: No results found for this or any previous visit (from the past 24 hour(s)).      Exercise Prescription Changes - 01/13/16 1200      Response to Exercise   Blood Pressure (Admit) 134/60   Blood Pressure (Exercise) 136/60   Blood Pressure (Exit) 114/60   Heart Rate (Admit) 70 bpm   Heart Rate (Exercise) 77 bpm   Heart Rate (Exit) 63 bpm   Oxygen Saturation (Admit) 97 %   Oxygen Saturation (Exercise) 93 %   Oxygen Saturation (Exit) 94 %   Rating of Perceived Exertion (Exercise) 11   Perceived Dyspnea (Exercise) 1   Duration Progress to 45 minutes of aerobic exercise without signs/symptoms of physical distress   Intensity THRR unchanged     Progression   Progression Continue to progress workloads to maintain intensity without signs/symptoms of physical distress.     Resistance Training   Training Prescription Yes   Weight blue bands   Reps 10-12  10 minutes of strength training     Interval Training   Interval  Training No     Bike   Level 1.5   Minutes 17     NuStep   Level 6   Minutes 10   METs 1.9     Track   Laps 10   Minutes 17     Goals Met:  Exercise tolerated well No report of cardiac concerns or symptoms Strength training completed today  Goals Unmet:  Not Applicable  Comments: Service time is from 10:30am to 12:00p    Dr. Rush Farmer is Medical Director for Pulmonary Rehab at [facility].

## 2016-01-15 ENCOUNTER — Encounter (HOSPITAL_COMMUNITY)
Admission: RE | Admit: 2016-01-15 | Discharge: 2016-01-15 | Disposition: A | Discharge status: Home / Self Care | Payer: Medicare Other | Source: Ambulatory Visit | Attending: Internal Medicine | Admitting: Internal Medicine

## 2016-01-15 ENCOUNTER — Ambulatory Visit (INDEPENDENT_AMBULATORY_CARE_PROVIDER_SITE_OTHER): Payer: Medicare Other | Admitting: *Deleted

## 2016-01-15 VITALS — Wt 221.3 lb

## 2016-01-15 DIAGNOSIS — J439 Emphysema, unspecified: Secondary | ICD-10-CM

## 2016-01-15 DIAGNOSIS — Z8673 Personal history of transient ischemic attack (TIA), and cerebral infarction without residual deficits: Secondary | ICD-10-CM

## 2016-01-15 LAB — CUP PACEART INCLINIC DEVICE CHECK
Date Time Interrogation Session: 20180111132508
Implantable Pulse Generator Implant Date: 20170619

## 2016-01-15 NOTE — Progress Notes (Signed)
Daily Session Note  Patient Details  Name: [name] [name] MRN: [id number] Date of Birth: 10/03/1940 Referring Provider:   April Manson Pulmonary Rehab Walk Test from 09/25/2015 in [place]  Referring Provider  Dr. Chase Caller      Encounter Date: 01/15/2016  Check In:     Session Check In - 01/15/16 1030      Check-In   Location [facility]   Staff Present Rosebud Poles, RN, BSN;Clessie Karras, MS, ACSM RCEP, Exercise Physiologist;Lisa Ysidro Evert, RN;Portia Rollene Rotunda, RN, BSN   Supervising physician immediately available to respond to emergencies [facility] immediately available   Physician(s) Dr. Eliseo Squires   Medication changes reported     No   Fall or balance concerns reported    No   Warm-up and Cool-down Performed as group-led instruction   Resistance Training Performed Yes   VAD Patient? No     Pain Assessment   Currently in Pain? No/denies   Multiple Pain Sites No      Capillary Blood Glucose: No results found for this or any previous visit (from the past 24 hour(s)).      Exercise Prescription Changes - 01/15/16 1200      Response to Exercise   Blood Pressure (Admit) 118/64   Blood Pressure (Exercise) 136/76   Blood Pressure (Exit) 120/60   Heart Rate (Admit) 68 bpm   Heart Rate (Exercise) 84 bpm   Heart Rate (Exit) 76 bpm   Oxygen Saturation (Admit) 97 %   Oxygen Saturation (Exercise) 96 %   Oxygen Saturation (Exit) 98 %   Rating of Perceived Exertion (Exercise) 11   Perceived Dyspnea (Exercise) 1   Duration Progress to 45 minutes of aerobic exercise without signs/symptoms of physical distress   Intensity THRR unchanged     Progression   Progression Continue to progress workloads to maintain intensity without signs/symptoms of physical distress.     Resistance Training   Training Prescription Yes   Weight blue bands   Reps 10-12  10 minutes of strength training     Interval Training   Interval  Training No     NuStep   Level 6   Minutes 10   METs 2.4     Track   Laps 14   Minutes 17     Goals Met:  Exercise tolerated well No report of cardiac concerns or symptoms Strength training completed today  Goals Unmet:  Not Applicable  Comments: Service time is from 10:30am to 12:30p    Dr. Rush Farmer is Medical Director for Pulmonary Rehab at [facility].

## 2016-01-15 NOTE — Progress Notes (Signed)
Loop check in clinic. Battery status: GOOD. R-waves 0.72mV. 0 symptom episodes, 0 tachy episodes, 0 pause episodes, 0 brady episodes. 0 AF episodes (0% burden). Monthly summary reports and ROV with SK in 07-2016.

## 2016-01-20 ENCOUNTER — Other Ambulatory Visit: Payer: Self-pay | Admitting: Family Medicine

## 2016-01-20 ENCOUNTER — Encounter (HOSPITAL_COMMUNITY)
Admission: RE | Admit: 2016-01-20 | Discharge: 2016-01-20 | Disposition: A | Discharge status: Home / Self Care | Payer: Medicare Other | Source: Ambulatory Visit | Attending: Internal Medicine | Admitting: Internal Medicine

## 2016-01-20 VITALS — Wt 228.4 lb

## 2016-01-20 DIAGNOSIS — I10 Essential (primary) hypertension: Secondary | ICD-10-CM

## 2016-01-20 DIAGNOSIS — J439 Emphysema, unspecified: Secondary | ICD-10-CM

## 2016-01-20 MED ORDER — AMLODIPINE BESYLATE 10 MG PO TABS: 10.0000 mg | ORAL_TABLET | Freq: Every day | ORAL | 3 refills | Status: DC

## 2016-01-20 NOTE — Progress Notes (Signed)
Daily Session Note  Patient Details  Name: [name] [name] MRN: [id number] Date of Birth: 1940-10-14 Referring Provider:   April Manson Pulmonary Rehab Walk Test from 09/25/2015 in [place]  Referring Provider  Dr. Chase Caller      Encounter Date: 01/20/2016  Check In:     Session Check In - 01/20/16 1030      Check-In   Location [facility]   Staff Present Rosebud Poles, RN, BSN;Noel Rodier, MS, ACSM RCEP, Exercise Physiologist;Lisa Ysidro Evert, RN;Portia Rollene Rotunda, RN, BSN   Supervising physician immediately available to respond to emergencies [facility] immediately available   Physician(s) Dr. Eliseo Squires   Medication changes reported     No   Fall or balance concerns reported    No   Warm-up and Cool-down Performed as group-led instruction   Resistance Training Performed Yes   VAD Patient? No     Pain Assessment   Currently in Pain? No/denies   Multiple Pain Sites No      Capillary Blood Glucose: No results found for this or any previous visit (from the past 24 hour(s)).      Exercise Prescription Changes - 01/20/16 1200      Response to Exercise   Blood Pressure (Admit) 152/78   Blood Pressure (Exercise) 164/90   Blood Pressure (Exit) 152/68   Heart Rate (Admit) 78 bpm   Heart Rate (Exercise) 86 bpm   Heart Rate (Exit) 79 bpm   Oxygen Saturation (Admit) 98 %   Oxygen Saturation (Exercise) 97 %   Oxygen Saturation (Exit) 95 %   Rating of Perceived Exertion (Exercise) 9   Perceived Dyspnea (Exercise) 1   Duration Progress to 45 minutes of aerobic exercise without signs/symptoms of physical distress   Intensity THRR unchanged     Progression   Progression Continue to progress workloads to maintain intensity without signs/symptoms of physical distress.     Resistance Training   Training Prescription Yes   Weight blue bands   Reps 10-12  10 minutes of strength training     Interval Training   Interval  Training No     Bike   Level 1.5   Minutes 17     NuStep   Level 6   Minutes 10   METs 2.3     Track   Laps 11   Minutes 17     Goals Met:  Exercise tolerated well No report of cardiac concerns or symptoms Strength training completed today  Goals Unmet:  Not Applicable  Comments: Service time is from 10:30am to 12:10p    Dr. Rush Farmer is Medical Director for Pulmonary Rehab at [facility].

## 2016-01-23 ENCOUNTER — Telehealth: Payer: Self-pay | Admitting: Family Medicine

## 2016-01-23 MED ORDER — HYDROCODONE-ACETAMINOPHEN 10-325 MG PO TABS: 1.0000 | ORAL_TABLET | Freq: Four times a day (QID) | ORAL | 0 refills | Status: DC | PRN

## 2016-01-23 NOTE — Telephone Encounter (Signed)
Patient calling to get rx for hydrocodone  [phone]

## 2016-01-23 NOTE — Telephone Encounter (Signed)
Ok to refill 

## 2016-01-23 NOTE — Telephone Encounter (Signed)
ok 

## 2016-01-23 NOTE — Telephone Encounter (Signed)
RX printed, left up front and patient's wife aware to pick up  

## 2016-01-27 ENCOUNTER — Encounter (HOSPITAL_COMMUNITY)
Admission: RE | Admit: 2016-01-27 | Discharge: 2016-01-27 | Disposition: A | Discharge status: Home / Self Care | Payer: Medicare Other | Source: Ambulatory Visit | Attending: Internal Medicine | Admitting: Internal Medicine

## 2016-01-27 DIAGNOSIS — J439 Emphysema, unspecified: Secondary | ICD-10-CM | POA: Diagnosis not present

## 2016-01-27 NOTE — Progress Notes (Signed)
Pulmonary Individual Treatment Plan  Patient Details  Name: [name] [name] MRN: [id number] Date of Birth: Jan 04, 1941 Referring Provider:   April Manson Pulmonary Rehab Walk Test from 09/25/2015 in [place]  Referring Provider  Dr. Chase Caller      Initial Encounter Date:  Flowsheet Row Pulmonary Rehab Walk Test from 09/25/2015 in [place]  Date  09/25/15  Referring Provider  Dr. Chase Caller      Visit Diagnosis: Pulmonary emphysema, unspecified emphysema type ([place])  Patient's Home Medications on Admission:   Current Outpatient Prescriptions:  .  acetaminophen (TYLENOL) 325 MG tablet, Take 650 mg by mouth every 6 (six) hours as needed., Disp: , Rfl:  .  amantadine (SYMMETREL) 100 MG capsule, TAKE 1 CAPSULE BY MOUTH TWICE DAILY WITH BREAKFAST AND LUNCH, Disp: 180 capsule, Rfl: 2 .  amLODipine (NORVASC) 10 MG tablet, Take 1 tablet (10 mg total) by mouth daily., Disp: 90 tablet, Rfl: 3 .  ARIPiprazole (ABILIFY) 5 MG tablet, Take 1 tablet (5 mg total) by mouth daily., Disp: 90 tablet, Rfl: 2 .  atorvastatin (LIPITOR) 20 MG tablet, TAKE 1 TABLET DAILY, Disp: 90 tablet, Rfl: 3 .  bisoprolol (ZEBETA) 10 MG tablet, Take 1 tablet (10 mg total) by mouth daily., Disp: 30 tablet, Rfl: 5 .  cholecalciferol (VITAMIN D) 400 units TABS tablet, Take 400 Units by mouth., Disp: , Rfl:  .  clopidogrel (PLAVIX) 75 MG tablet, Take 1 tablet (75 mg total) by mouth daily., Disp: 90 tablet, Rfl: 2 .  Cyanocobalamin (B-12) 1000 MCG CAPS, Take 10,000 mcg by mouth daily., Disp: , Rfl:  .  folic acid (FOLVITE) 1 MG tablet, Take 1 mg by mouth daily., Disp: , Rfl:  .  furosemide (LASIX) 40 MG tablet, Take 1 tablet (40 mg total) by mouth 2 (two) times daily., Disp: 180 tablet, Rfl: 1 .  HYDROcodone-acetaminophen (NORCO) 10-325 MG tablet, Take 1 tablet by mouth every 6 (six) hours as needed for moderate pain or severe pain., Disp: 100 tablet, Rfl: 0 .   losartan (COZAAR) 50 MG tablet, Take 50 mg by mouth daily., Disp: , Rfl:  .  MAGNESIUM CITRATE PO, Take by mouth., Disp: , Rfl:  .  MYRBETRIQ 25 MG TB24 tablet, , Disp: , Rfl:  .  pantoprazole (PROTONIX) 40 MG tablet, Take 1 tablet (40 mg total) by mouth daily., Disp: 90 tablet, Rfl: 3 .  PROAIR HFA 108 (90 Base) MCG/ACT inhaler, USE 1 TO 2 INHALATIONS EVERY 6 HOURS AS NEEDED FOR WHEEZING OR SHORTNESS OF BREATH, Disp: 25.5 g, Rfl: 4 .  rOPINIRole (REQUIP) 1 MG tablet, TAKE 1 TABLET AT BEDTIME, Disp: 90 tablet, Rfl: 3 .  SPIRIVA HANDIHALER 18 MCG inhalation capsule, INHALE THE CONTENTS OF 1 CAPSULE WITH 2         INHALATIONS ONCE DAILY AS DIRECTED, Disp: 1 capsule, Rfl: 11 .  UNABLE TO FIND, Tumeric daily, Disp: , Rfl:  .  venlafaxine XR (EFFEXOR-XR) 150 MG 24 hr capsule, TAKE 1 CAPSULE TWICE A DAY, Disp: 180 capsule, Rfl: 3  Past Medical History: Past Medical History:  Diagnosis Date  . Arthritis    s/p TKR  . Bladder cancer (HCC)    [facility], [facility] low grade papillary urothileal carcinoma  . BPH (benign prostatic hyperplasia)   . Brugada syndrome    Possible Type II Brugada ECG pattern. No family history of SCD, no syncope, no tachypalpitations.  [place] [facility] CAD (coronary artery disease)  a. LHC 1/15 - mid LCx 50 and 60%, proximal RCA 50%  . Chronic diastolic CHF (congestive heart failure) ([place])   . CKD (chronic kidney disease)   . COPD (chronic obstructive pulmonary disease) ([place])    Prior heavy smoker. PFTs (3/11): FVC 87%, FEV1 73%, ratio 0.57, DLCO 75%, TLC 121%. Moderate obstructive defect. PFTs (9/15): Only minimal obstruction, sugPrior heavy smoker. PFTs (3/11): FVC 87%, FEV1 73%, ratio 0.57, DLCO 75%, TLC 121%. Moderate obstructive defect. PFTs (9/15) minimal obstruction, poss asthma component   . Depression    with bipolar tendencies  . DOE (dyspnea on exertion)    a. Myoview 8/09- EF 57%, no ischemia // b. Myoview (3/11) EF 68%, diaphragmatic attenuation, no ischemia //  c Echo (4/11) EF  39-03%, mild diastolic dysfunction, PASP 38 mmHg // d. PFTs 9/15 minimal obstruction  /  e. CT negative for ILD  . GERD (gastroesophageal reflux disease)   . History of Doppler ultrasound    Carotid US 3/11 negative for significant stenosis  //  carotid US 6/17: Mild bilateral plaque, 1-39% ICA  . History of echocardiogram    a. Echo 12/14 Inferior and distal septal HK, mild LVH, EF 50-55%, mild MR, mild LAE  //  b. Echo 6/17: Mild focal basal septal hypertrophy, EF 60-65%, normal wall motion, grade 1 diastolic dysfunction, mild AI  . HLD (hyperlipidemia)   . HTN (hypertension)   . Low testosterone   . Lung nodule    a. CT in 2015 >> PET in 12/15 not sugg of malignancy   . LVH (left ventricular hypertrophy)    a. Echo 12/14 Inferior and distal septal HK, mild LVH, EF 50-55%, mild MR, mild LAE  . Mild dementia   . Mitral regurgitation    Echo (4/11) with PISA ERO 0.3 cm^2 and regurgitant volume 41 mL (moderate MR). Echo (12/14) with mild MR.   . MS (multiple sclerosis) ([place])   . Right bundle branch block   . Stroke ([place])   . Thoracic aortic aneurysm (HCC)    4.1 cm 2017  . Urinary retention with incomplete bladder emptying    receives botox injections and treatment for BPH through [facility]    Tobacco Use: History  Smoking Status  . Former Smoker  . Packs/day: 2.00  . Years: 40.00  . Types: Cigarettes  . Quit date: 01/04/1993  Smokeless Tobacco  . Former Systems developer  . Types: Chew    Comment: patient still smokes a pipe daily    Labs: Recent Review Flowsheet Data    Labs for ITP Cardiac and Pulmonary Rehab Latest Ref Rng & Units 03/31/2015 06/16/2015 06/20/2015 06/21/2015 06/26/2015   Cholestrol 0 - 200 mg/dL 158 185 - 140 -   LDLCALC 0 - 99 mg/dL 76 91 - 59 -   LDLDIRECT mg/dL - - - - -   HDL >40 mg/dL 48 43 - 33(L) -   Trlycerides <150 mg/dL 170(H) 256(H) - 239(H) -   Hemoglobin A1c <5.7 % 6.4(H) 6.4(H) - - 6.3(H)   TCO2 0 - 100 mmol/L - - 27 - -      Capillary Blood  Glucose: Lab Results  Component Value Date   GLUCAP 80 06/20/2015   GLUCAP 123 (H) 06/15/2015   GLUCAP 94 12/11/2013     ADL UCSD:   Pulmonary Function Assessment:     Pulmonary Function Assessment - 09/19/15 1022      Breath   Bilateral Breath Sounds Clear   Shortness of Breath Yes;Limiting  activity      Exercise Target Goals:    Exercise Program Goal: Individual exercise prescription set with THRR, safety & activity barriers. Participant demonstrates ability to understand and report RPE using BORG scale, to self-measure pulse accurately, and to acknowledge the importance of the exercise prescription.  Exercise Prescription Goal: Starting with aerobic activity 30 plus minutes a day, 3 days per week for initial exercise prescription. Provide home exercise prescription and guidelines that participant acknowledges understanding prior to discharge.  Activity Barriers & Risk Stratification:     Activity Barriers & Cardiac Risk Stratification - 09/19/15 1021      Activity Barriers & Cardiac Risk Stratification   Activity Barriers Shortness of Breath;Left Knee Replacement;Right Knee Replacement;Arthritis;Joint Problems;Muscular Weakness      6 Minute Walk:     6 Minute Walk    Row Name 09/25/15 1624         6 Minute Walk   Phase Initial     Distance 1100 feet     Walk Time 6 minutes     # of Rest Breaks 0     MPH 2.08     METS 2.53     RPE 12     Perceived Dyspnea  2     Symptoms Yes (comment)     Comments back ache     Resting HR 70 bpm     Resting BP 124/64     Max Ex. HR 97 bpm     Max Ex. BP 140/68       Interval HR   Baseline HR 70     1 Minute HR 80     2 Minute HR 91     3 Minute HR 97     4 Minute HR 87     5 Minute HR 96     6 Minute HR 96     2 Minute Post HR 74     Interval Heart Rate? Yes       Interval Oxygen   Interval Oxygen? Yes     Baseline Oxygen Saturation % 93 %     Baseline Liters of Oxygen 0 L     1 Minute Oxygen  Saturation % 91 %     1 Minute Liters of Oxygen 0 L     2 Minute Oxygen Saturation % 92 %     2 Minute Liters of Oxygen 0 L     3 Minute Oxygen Saturation % 92 %     3 Minute Liters of Oxygen 0 L     4 Minute Oxygen Saturation % 93 %     4 Minute Liters of Oxygen 0 L     5 Minute Oxygen Saturation % 93 %     5 Minute Liters of Oxygen 0 L     6 Minute Oxygen Saturation % 93 %     6 Minute Liters of Oxygen 0 L     2 Minute Post Oxygen Saturation % 96 %     2 Minute Post Liters of Oxygen 0 L        Initial Exercise Prescription:     Initial Exercise Prescription - 09/25/15 1600      Date of Initial Exercise RX and Referring Provider   Date 09/25/15   Referring Provider Dr. Chase Caller     Bike   Level 0.5   Minutes 17     NuStep   Level 2   Minutes 17   METs 1.5  Track   Laps 6   Minutes 17     Prescription Details   Frequency (times per week) 2   Duration Progress to 45 minutes of aerobic exercise without signs/symptoms of physical distress     Intensity   THRR 40-80% of Max Heartrate 59-118   Ratings of Perceived Exertion 11-13   Perceived Dyspnea 0-4     Progression   Progression Continue progressive overload as per policy without signs/symptoms or physical distress.     Resistance Training   Training Prescription Yes   Weight blue bands   Reps 10-12      Perform Capillary Blood Glucose checks as needed.  Exercise Prescription Changes:     Exercise Prescription Changes    Row Name 10/02/15 1200 10/07/15 1200 10/09/15 1200 10/14/15 1200 10/16/15 1200     Exercise Review   Progression  -  -  - Yes Yes     Response to Exercise   Blood Pressure (Admit) 120/60 106/60 118/70 120/60 110/50   Blood Pressure (Exercise) 150/70 132/80 138/78 130/70 116/60   Blood Pressure (Exit) 134/82 110/72 116/60 124/64 104/60   Heart Rate (Admit) 81 bpm 84 bpm 70 bpm 77 bpm 65 bpm   Heart Rate (Exercise) 95 bpm 87 bpm 92 bpm 90 bpm 83 bpm   Heart Rate (Exit) 87 bpm  69 bpm 76 bpm 72 bpm 73 bpm   Oxygen Saturation (Admit) 96 % 94 % 97 % 97 % 97 %   Oxygen Saturation (Exercise) 97 % 93 % 97 % 96 % 98 %   Oxygen Saturation (Exit) 97 % 95 % 96 % 97 % 96 %   Rating of Perceived Exertion (Exercise) 11 13 11 13 13    Perceived Dyspnea (Exercise) 0 2 0 1 1   Duration Progress to 45 minutes of aerobic exercise without signs/symptoms of physical distress Progress to 45 minutes of aerobic exercise without signs/symptoms of physical distress Progress to 45 minutes of aerobic exercise without signs/symptoms of physical distress Progress to 45 minutes of aerobic exercise without signs/symptoms of physical distress Progress to 45 minutes of aerobic exercise without signs/symptoms of physical distress   Intensity THRR unchanged THRR unchanged THRR unchanged THRR unchanged THRR unchanged     Progression   Progression Continue progressive overload as per policy without signs/symptoms or physical distress. Continue progressive overload as per policy without signs/symptoms or physical distress. Continue progressive overload as per policy without signs/symptoms or physical distress. Continue to progress workloads to maintain intensity without signs/symptoms of physical distress. Continue to progress workloads to maintain intensity without signs/symptoms of physical distress.     Resistance Training   Training Prescription Yes Yes Yes Yes Yes   Weight blue bands blue bands blue bands blue bands blue bands   Reps 10-12 10-12  10 minutesof strength training 10-12  10 minutesof strength training 10-12  10 minutes of strength training 10-12  10 minutes of strength training     Interval Training   Interval Training No No No No No     Bike   Level 0.5 0.5 0.5 0.5 0.5   Minutes 17 17 17 17 17      NuStep   Level 2 2  - 3 4   Minutes 17 17  - 17 17   METs 2 1.9  - 1.9 2.3     Track   Laps  - 9 14 16   -   Minutes  - 17 17 17   -  Row Name 10/21/15 1200 10/30/15 1200 11/13/15  1300 11/18/15 1200 11/20/15 1200     Exercise Review   Progression Yes  -  - Yes Yes     Response to Exercise   Blood Pressure (Admit) 110/54 128/64 116/60 108/60 156/74   Blood Pressure (Exercise) 120/80 144/66 104/60 144/80 140/70   Blood Pressure (Exit) 130/62 138/74 120/70 118/64 118/64   Heart Rate (Admit) 65 bpm 87 bpm 75 bpm 98 bpm 87 bpm   Heart Rate (Exercise) 76 bpm 97 bpm 90 bpm 114 bpm 91 bpm   Heart Rate (Exit) 73 bpm 78 bpm 72 bpm 92 bpm 80 bpm   Oxygen Saturation (Admit) 97 % 90 % 95 % 98 % 98 %   Oxygen Saturation (Exercise) 95 % 95 % 95 % 96 % 97 %   Oxygen Saturation (Exit) 96 % 95 % 97 % 96 % 96 %   Rating of Perceived Exertion (Exercise) 11 9 9 9 11    Perceived Dyspnea (Exercise) 1 0 0 0 1   Duration Progress to 45 minutes of aerobic exercise without signs/symptoms of physical distress Progress to 45 minutes of aerobic exercise without signs/symptoms of physical distress Progress to 45 minutes of aerobic exercise without signs/symptoms of physical distress Progress to 45 minutes of aerobic exercise without signs/symptoms of physical distress Progress to 45 minutes of aerobic exercise without signs/symptoms of physical distress   Intensity THRR unchanged THRR unchanged THRR unchanged THRR unchanged THRR unchanged     Progression   Progression Continue to progress workloads to maintain intensity without signs/symptoms of physical distress. Continue to progress workloads to maintain intensity without signs/symptoms of physical distress. Continue to progress workloads to maintain intensity without signs/symptoms of physical distress. Continue to progress workloads to maintain intensity without signs/symptoms of physical distress. Continue to progress workloads to maintain intensity without signs/symptoms of physical distress.     Resistance Training   Training Prescription Yes Yes Yes Yes Yes   Weight blue bands blue bands blue bands blue bands blue bands   Reps 10-12  10  minutes of strength training 10-12  10 minutes of strength training 10-12  10 minutes of strength training 10-12  10 minutes of strength training 10-12  10 minutes of strength training     Interval Training   Interval Training No No No No No     Bike   Level 0.8 0.9 0.8 1.2  -   Minutes 17 17 17 17   -     NuStep   Level 4 4 5 5 6    Minutes 17 17 17 17 17    METs 2.8 2.3 2.7 2.6 2.2     Track   Laps 19  -  - 13 13   Minutes 17  -  - 17 17   Row Name 11/25/15 1207 12/02/15 1200 12/04/15 1223 12/09/15 1200 12/11/15 1200     Response to Exercise   Blood Pressure (Admit) 124/66 124/64 120/80 136/70 136/70   Blood Pressure (Exercise) 130/74 148/74 140/80 130/70 146/74   Blood Pressure (Exit) 112/60 114/60 120/60 106/60 110/60   Heart Rate (Admit) 68 bpm 73 bpm 63 bpm 84 bpm 71 bpm   Heart Rate (Exercise) 96 bpm 79 bpm 70 bpm 100 bpm 75 bpm   Heart Rate (Exit) 81 bpm 69 bpm 69 bpm 84 bpm 73 bpm   Oxygen Saturation (Admit) 98 % 95 % 96 % 97 % 96 %   Oxygen Saturation (Exercise) 95 % 96 %  96 % 95 % 99 %   Oxygen Saturation (Exit) 94 % 95 % 96 % 96 % 96 %   Rating of Perceived Exertion (Exercise) 13 9 13 11 11    Perceived Dyspnea (Exercise) 1 1 2 1 1    Duration Progress to 45 minutes of aerobic exercise without signs/symptoms of physical distress Progress to 45 minutes of aerobic exercise without signs/symptoms of physical distress Progress to 45 minutes of aerobic exercise without signs/symptoms of physical distress Progress to 45 minutes of aerobic exercise without signs/symptoms of physical distress Progress to 45 minutes of aerobic exercise without signs/symptoms of physical distress   Intensity THRR unchanged THRR unchanged THRR unchanged THRR unchanged THRR unchanged     Progression   Progression Continue to progress workloads to maintain intensity without signs/symptoms of physical distress. Continue to progress workloads to maintain intensity without signs/symptoms of physical  distress. Continue to progress workloads to maintain intensity without signs/symptoms of physical distress. Continue to progress workloads to maintain intensity without signs/symptoms of physical distress. Continue to progress workloads to maintain intensity without signs/symptoms of physical distress.     Resistance Training   Training Prescription Yes Yes Yes Yes Yes   Weight blue bands blue bands blue bands blue bands blue bands   Reps 10-12  10 minutes of strength training 10-12  10 minutes of strength training 10-12  10 minutes of strength training 10-12  10 minutes of strength training 10-12  10 minutes of strength training     Interval Training   Interval Training No No No No No     Bike   Level 1.2 1.2 1.2 1.2 1.2   Minutes 17 34 12 12 12      NuStep   Level 6 6  - 6  -   Minutes 17 17  - 17  -   METs 2.4 2  - 2.5  -     Track   Laps 10  - 13 14 13    Minutes 17  - 17 17 17      Home Exercise Plan   Plans to continue exercise at  [facility] (comment)  -  -  -   Frequency  - Add 3 additional days to program exercise sessions.  -  -  -   Row Name 12/16/15 1200 12/23/15 1200 12/30/15 1216 01/01/16 1227 01/08/16 1236     Exercise Review   Progression Yes  -  -  - Yes     Response to Exercise   Blood Pressure (Admit) 134/64 142/78 120/66 110/54 140/66   Blood Pressure (Exercise) 144/74 152/76 160/78 118/70 142/80   Blood Pressure (Exit) 110/68 132/70 124/88 110/70 124/70   Heart Rate (Admit) 68 bpm 84 bpm 78 bpm 73 bpm 73 bpm   Heart Rate (Exercise) 78 bpm 93 bpm 97 bpm 73 bpm 78 bpm   Heart Rate (Exit) 64 bpm 78 bpm 84 bpm 70 bpm 65 bpm   Oxygen Saturation (Admit) 94 % 96 % 98 % 93 % 91 %   Oxygen Saturation (Exercise) 93 % 95 % 96 % 95 % 93 %   Oxygen Saturation (Exit) 93 % 96 % 95 % 95 % 95 %   Rating of Perceived Exertion (Exercise) 9 9 15 7 7    Perceived Dyspnea (Exercise) 1 1 2 1 1    Duration Progress to 45 minutes of aerobic exercise without  signs/symptoms of physical distress Progress to 45 minutes of aerobic exercise without signs/symptoms of physical distress  Progress to 45 minutes of aerobic exercise without signs/symptoms of physical distress Progress to 45 minutes of aerobic exercise without signs/symptoms of physical distress Progress to 45 minutes of aerobic exercise without signs/symptoms of physical distress   Intensity THRR unchanged THRR unchanged THRR unchanged THRR unchanged THRR unchanged     Progression   Progression Continue to progress workloads to maintain intensity without signs/symptoms of physical distress. Continue to progress workloads to maintain intensity without signs/symptoms of physical distress. Continue to progress workloads to maintain intensity without signs/symptoms of physical distress. Continue to progress workloads to maintain intensity without signs/symptoms of physical distress. Continue to progress workloads to maintain intensity without signs/symptoms of physical distress.     Resistance Training   Training Prescription Yes Yes Yes Yes Yes   Weight blue bands blue bands blue bands blue bands blue bands   Reps 10-12  10 minutes of strength training 10-12  10 minutes of strength training 10-12  10 minutes of strength training 10-12  10 minutes of strength training 10-12  10 minutes of strength training     Interval Training   Interval Training No No No No No     Bike   Level 1.5 1.2 1.2 1.2 1.5   Minutes 17 17 17 17 17      NuStep   Level 5 6 6 6 6    Minutes 17 17 17 10 10    METs 1.7 2.7 2.7  - 1.8     Track   Laps 12 16 10   -  -   Minutes 17 17 17   -  -   Row Name 01/13/16 1200 01/15/16 1200 01/20/16 1200         Response to Exercise   Blood Pressure (Admit) 134/60 118/64 152/78     Blood Pressure (Exercise) 136/60 136/76 164/90     Blood Pressure (Exit) 114/60 120/60 152/68     Heart Rate (Admit) 70 bpm 68 bpm 78 bpm     Heart Rate (Exercise) 77 bpm 84 bpm 86 bpm     Heart  Rate (Exit) 63 bpm 76 bpm 79 bpm     Oxygen Saturation (Admit) 97 % 97 % 98 %     Oxygen Saturation (Exercise) 93 % 96 % 97 %     Oxygen Saturation (Exit) 94 % 98 % 95 %     Rating of Perceived Exertion (Exercise) 11 11 9      Perceived Dyspnea (Exercise) 1 1 1      Duration Progress to 45 minutes of aerobic exercise without signs/symptoms of physical distress Progress to 45 minutes of aerobic exercise without signs/symptoms of physical distress Progress to 45 minutes of aerobic exercise without signs/symptoms of physical distress     Intensity THRR unchanged THRR unchanged THRR unchanged       Progression   Progression Continue to progress workloads to maintain intensity without signs/symptoms of physical distress. Continue to progress workloads to maintain intensity without signs/symptoms of physical distress. Continue to progress workloads to maintain intensity without signs/symptoms of physical distress.       Resistance Training   Training Prescription Yes Yes Yes     Weight blue bands blue bands blue bands     Reps 10-12  10 minutes of strength training 10-12  10 minutes of strength training 10-12  10 minutes of strength training       Interval Training   Interval Training No No No       Bike   Level 1.5  -  1.5     Minutes 17  - 17       NuStep   Level 6 6 6      Minutes 10 10 10      METs 1.9 2.4 2.3       Track   Laps 10 14 11      Minutes 17 17 17         Exercise Comments:     Exercise Comments    Row Name 10/13/15 1019 11/10/15 1649 12/02/15 1324 12/08/15 1024 12/23/15 0814   Exercise Comments Has only attended three sessions. Too soon to monitor progression.  Patient is slowly progressing. Patient is up to walking 19 laps in 15 minutes. Is currently out on vacation. Will cont. to monitor and progress when he returns to rehab.  Home exercise completed Patient is slowly progressing in program. Often needs to be encouraged to increase speed (due to forgetfullness). Will  cont. to monitor and progress. Patient is slowly progressing in program. Often needs to be encouraged to increase speed (due to forgetfullness). Will cont. to monitor and progress.   [place] Name 01/26/16 1638           Exercise Comments Patient is slowly progressing in program. He comments frequently on his overall pain. Wife states that he is rather sedentary at home. MET level places him at a low level. He will be graduating on his next session.           Discharge Exercise Prescription (Final Exercise Prescription Changes):     Exercise Prescription Changes - 01/20/16 1200      Response to Exercise   Blood Pressure (Admit) 152/78   Blood Pressure (Exercise) 164/90   Blood Pressure (Exit) 152/68   Heart Rate (Admit) 78 bpm   Heart Rate (Exercise) 86 bpm   Heart Rate (Exit) 79 bpm   Oxygen Saturation (Admit) 98 %   Oxygen Saturation (Exercise) 97 %   Oxygen Saturation (Exit) 95 %   Rating of Perceived Exertion (Exercise) 9   Perceived Dyspnea (Exercise) 1   Duration Progress to 45 minutes of aerobic exercise without signs/symptoms of physical distress   Intensity THRR unchanged     Progression   Progression Continue to progress workloads to maintain intensity without signs/symptoms of physical distress.     Resistance Training   Training Prescription Yes   Weight blue bands   Reps 10-12  10 minutes of strength training     Interval Training   Interval Training No     Bike   Level 1.5   Minutes 17     NuStep   Level 6   Minutes 10   METs 2.3     Track   Laps 11   Minutes 17      Nutrition:  Target Goals: Understanding of nutrition guidelines, daily intake of sodium <1550m, cholesterol <2084m calories 30% from fat and 7% or less from saturated fats, daily to have 5 or more servings of fruits and vegetables.  Biometrics:     Pre Biometrics - 09/19/15 1056      Pre Biometrics   Grip Strength 35 kg       Nutrition Therapy Plan and Nutrition Goals:      Nutrition Therapy & Goals - 12/02/15 1209      Nutrition Therapy   Diet Therapeutic Lifestyle Changes     Personal Nutrition Goals   Personal Goal #1 1-2 lb wt loss/week to a wt loss goal of 6-24 lb at graduation  from Pulmonary Rehab     Intervention Plan   Intervention Prescribe, educate and counsel regarding individualized specific dietary modifications aiming towards targeted core components such as weight, hypertension, lipid management, diabetes, heart failure and other comorbidities.   Expected Outcomes Short Term Goal: Understand basic principles of dietary content, such as calories, fat, sodium, cholesterol and nutrients.;Long Term Goal: Adherence to prescribed nutrition plan.      Nutrition Discharge: Rate Your Plate Scores:     Nutrition Assessments - 12/02/15 1209      Rate Your Plate Scores   Pre Score 50      Psychosocial: Target Goals: Acknowledge presence or absence of depression, maximize coping skills, provide positive support system. Participant is able to verbalize types and ability to use techniques and skills needed for reducing stress and depression.  Initial Review & Psychosocial Screening:     Initial Psych Review & Screening - 10/14/15 0712      Barriers   Psychosocial barriers to participate in program There are no identifiable barriers or psychosocial needs.      Quality of Life Scores:   PHQ-9: Recent Review Flowsheet Data    Depression screen [facility] 2/9 09/19/2015 07/04/2015 07/02/2015   Decreased Interest 0 3 0   Down, Depressed, Hopeless 0 3 0   PHQ - 2 Score 0 6 0   Altered sleeping - 2 -   Tired, decreased energy - 3 -   Change in appetite - 3 -   Feeling bad or failure about yourself  - 3 -   Trouble concentrating - 2 -   Moving slowly or fidgety/restless - 0 -   Suicidal thoughts - 0 -   PHQ-9 Score - 19 -   Difficult doing work/chores - Extremely dIfficult -      Psychosocial Evaluation and Intervention:     Psychosocial  Evaluation - 01/27/16 0718      Discharge Psychosocial Assessment & Intervention   Comments He continues to have barriers to exercise however his wife is a great caregiver and she insures that he overcomes those barriers daily.      Psychosocial Re-Evaluation:     Psychosocial Re-Evaluation    Row Name 10/14/15 [phone] 11/10/15 1640 12/09/15 0856 12/30/15 0835 01/27/16 0718     Psychosocial Re-Evaluation   Interventions Encouraged to attend Pulmonary Rehabilitation for the exercise Encouraged to attend Pulmonary Rehabilitation for the exercise Encouraged to attend Pulmonary Rehabilitation for the exercise Encouraged to attend Pulmonary Rehabilitation for the exercise Encouraged to attend Pulmonary Rehabilitation for the exercise   Comments no psychosocial barriers to participation identified at this time no psychosocial barriers to participation identified at this time no psychosocial barriers to participation identified at this time no psychosocial barriers to participation identified at this time no psychosocial barriers to participation identified at this time      Education: Education Goals: Education classes will be provided on a weekly basis, covering required topics. Participant will state understanding/return demonstration of topics presented.  Learning Barriers/Preferences:     Learning Barriers/Preferences - 09/19/15 1022      Learning Barriers/Preferences   Learning Barriers None   Learning Preferences Computer/Internet;Written Material;Skilled Demonstration;Individual Instruction;Group Instruction      Education Topics: How Lungs Work and Diseases: - Discuss the anatomy of the lungs and diseases that can affect the lungs, such as COPD.   Exercise: -Discuss the importance of exercise, FITT principles of exercise, normal and abnormal responses to exercise, and how to exercise safely.   Environmental  Irritants: -Discuss types of environmental irritants and how to  limit exposure to environmental irritants.   Meds/Inhalers and oxygen: - Discuss respiratory medications, definition of an inhaler and oxygen, and the proper way to use an inhaler and oxygen.   Energy Saving Techniques: - Discuss methods to conserve energy and decrease shortness of breath when performing activities of daily living.    Bronchial Hygiene / Breathing Techniques: - Discuss breathing mechanics, pursed-lip breathing technique,  proper posture, effective ways to clear airways, and other functional breathing techniques   Cleaning Equipment: - Provides group verbal and written instruction about the health risks of elevated stress, cause of high stress, and healthy ways to reduce stress.   Nutrition I: Fats: - Discuss the types of cholesterol, what cholesterol does to the body, and how cholesterol levels can be controlled.   Nutrition II: Labels: -Discuss the different components of food labels and how to read food labels.   Respiratory Infections: - Discuss the signs and symptoms of respiratory infections, ways to prevent respiratory infections, and the importance of seeking medical treatment when having a respiratory infection.   Stress I: Signs and Symptoms: - Discuss the causes of stress, how stress may lead to anxiety and depression, and ways to limit stress.   Stress II: Relaxation: -Discuss relaxation techniques to limit stress.   Oxygen for Home/Travel: - Discuss how to prepare for travel when on oxygen and proper ways to transport and store oxygen to ensure safety.   Knowledge Questionnaire Score:   Core Components/Risk Factors/Patient Goals at Admission:     Personal Goals and Risk Factors at Admission - 09/19/15 1023      Core Components/Risk Factors/Patient Goals on Admission    Weight Management Yes;Obesity;Weight Loss   Intervention Weight Management/Obesity: Establish reasonable short term and long term weight goals.;Obesity: Provide  education and appropriate resources to help participant work on and attain dietary goals.   Expected Outcomes Short Term: Continue to assess and modify interventions until short term weight is achieved;Long Term: Adherence to nutrition and physical activity/exercise program aimed toward attainment of established weight goal;Weight Loss: Understanding of general recommendations for a balanced deficit meal plan, which promotes 1-2 lb weight loss per week and includes a negative energy balance of [phone] kcal/d;Understanding recommendations for meals to include 15-35% energy as protein, 25-35% energy from fat, 35-60% energy from carbohydrates, less than 270m of dietary cholesterol, 20-35 gm of total fiber daily;Understanding of distribution of calorie intake throughout the day with the consumption of 4-5 meals/snacks   Sedentary Yes   Intervention Provide advice, education, support and counseling about physical activity/exercise needs.;Develop an individualized exercise prescription for aerobic and resistive training based on initial evaluation findings, risk stratification, comorbidities and participant's personal goals.   Expected Outcomes Achievement of increased cardiorespiratory fitness and enhanced flexibility, muscular endurance and strength shown through measurements of functional capacity and personal statement of participant.   Increase Strength and Stamina Yes   Intervention Provide advice, education, support and counseling about physical activity/exercise needs.;Develop an individualized exercise prescription for aerobic and resistive training based on initial evaluation findings, risk stratification, comorbidities and participant's personal goals.   Expected Outcomes Achievement of increased cardiorespiratory fitness and enhanced flexibility, muscular endurance and strength shown through measurements of functional capacity and personal statement of participant.   Tobacco Cessation Yes    Intervention Assist the participant in steps to quit. Provide individualized education and counseling about committing to Tobacco Cessation, relapse prevention, and pharmacological support that can be provided by  physician.;Advice worker, assist with locating and accessing local/national Quit Smoking programs, and support quit date choice.   Expected Outcomes Short Term: Will demonstrate readiness to quit, by selecting a quit date.;Long Term: Complete abstinence from all tobacco products for at least 12 months from quit date.;Short Term: Will quit all tobacco product use, adhering to prevention of relapse plan.   Improve shortness of breath with ADL's Yes   Intervention Provide education, individualized exercise plan and daily activity instruction to help decrease symptoms of SOB with activities of daily living.   Expected Outcomes Short Term: Achieves a reduction of symptoms when performing activities of daily living.   Develop more efficient breathing techniques such as purse lipped breathing and diaphragmatic breathing; and practicing self-pacing with activity Yes   Intervention Provide education, demonstration and support about specific breathing techniuqes utilized for more efficient breathing. Include techniques such as pursed lipped breathing, diaphragmatic breathing and self-pacing activity.   Expected Outcomes Short Term: Participant will be able to demonstrate and use breathing techniques as needed throughout daily activities.      Core Components/Risk Factors/Patient Goals Review:      Goals and Risk Factor Review    Row Name 10/14/15 0706 11/10/15 1640 12/09/15 0856 12/30/15 0835 01/27/16 0717     Core Components/Risk Factors/Patient Goals Review   Personal Goals Review Increase Strength and Stamina;Sedentary;Weight Management/Obesity;Tobacco Cessation;Improve shortness of breath with ADL's;Develop more efficient breathing techniques such as purse lipped breathing and  diaphragmatic breathing and practicing self-pacing with activity. Increase Strength and Stamina;Sedentary;Weight Management/Obesity;Tobacco Cessation;Improve shortness of breath with ADL's;Develop more efficient breathing techniques such as purse lipped breathing and diaphragmatic breathing and practicing self-pacing with activity. Increase Strength and Stamina;Sedentary;Weight Management/Obesity;Tobacco Cessation;Improve shortness of breath with ADL's;Develop more efficient breathing techniques such as purse lipped breathing and diaphragmatic breathing and practicing self-pacing with activity. Increase Strength and Stamina;Sedentary;Weight Management/Obesity;Tobacco Cessation;Improve shortness of breath with ADL's;Develop more efficient breathing techniques such as purse lipped breathing and diaphragmatic breathing and practicing self-pacing with activity. Increase Strength and Stamina;Sedentary;Weight Management/Obesity;Tobacco Cessation;Improve shortness of breath with ADL's;Develop more efficient breathing techniques such as purse lipped breathing and diaphragmatic breathing and practicing self-pacing with activity.   Review see "comments" section on ITP see "comments" section on ITP see "comments" section on ITP see "comments" section on ITP see "comments" section on ITP   Expected Outcomes See "Admission" expected outcomes See "Admission" expected outcomes See "Admission" expected outcomes See "Admission" expected outcomes See "Admission" expected outcomes      Core Components/Risk Factors/Patient Goals at Discharge (Final Review):      Goals and Risk Factor Review - 01/27/16 0717      Core Components/Risk Factors/Patient Goals Review   Personal Goals Review Increase Strength and Stamina;Sedentary;Weight Management/Obesity;Tobacco Cessation;Improve shortness of breath with ADL's;Develop more efficient breathing techniques such as purse lipped breathing and diaphragmatic breathing and practicing  self-pacing with activity.   Review see "comments" section on ITP   Expected Outcomes See "Admission" expected outcomes      ITP Comments:   Comments: ITP REVIEW Pt has not made expected progress toward pulmonary rehab goals after completing 24 sessions. He has tolerated workload increases however related to his decreased cognitive function has to reminded at each session to exercise. He has not lost weight and does not monitor his diet at home. He is extremely limited by his chronic pain. His wife states he remains sedentary at home unless she continuously encourages him to exercise with her. He will be discharged today. Recommend continued  exercise at home, life style modification, education, and utilization of breathing techniques to increase stamina and strength and decrease shortness of breath with exertion.

## 2016-01-30 DIAGNOSIS — N3281 Overactive bladder: Secondary | ICD-10-CM | POA: Diagnosis not present

## 2016-02-10 ENCOUNTER — Other Ambulatory Visit: Payer: Self-pay | Admitting: Internal Medicine

## 2016-02-13 ENCOUNTER — Encounter (HOSPITAL_COMMUNITY): Payer: Self-pay

## 2016-02-13 DIAGNOSIS — J439 Emphysema, unspecified: Secondary | ICD-10-CM

## 2016-02-13 NOTE — Progress Notes (Signed)
Discharge Summary  Patient Details  Name: [name] [name] MRN: [id number] Date of Birth: Jun 22, 1940 Referring Provider:   April Manson Pulmonary Rehab Walk Test from 09/25/2015 in [place]  Referring Provider  Dr. Chase Caller       Number of Visits: 24  Reason for Discharge:  Patient reached a stable level of exercise. He is no longer progressing.  Smoking History:  History  Smoking Status  . Former Smoker  . Packs/day: 2.00  . Years: 40.00  . Types: Cigarettes  . Quit date: 01/04/1993  Smokeless Tobacco  . Former Systems developer  . Types: Chew    Comment: patient still smokes a pipe daily    Diagnosis:  Pulmonary emphysema, unspecified emphysema type (Funk)  ADL UCSD:   Initial Exercise Prescription:   Discharge Exercise Prescription (Final Exercise Prescription Changes):     Exercise Prescription Changes - 01/20/16 1200      Response to Exercise   Blood Pressure (Admit) 152/78   Blood Pressure (Exercise) 164/90   Blood Pressure (Exit) 152/68   Heart Rate (Admit) 78 bpm   Heart Rate (Exercise) 86 bpm   Heart Rate (Exit) 79 bpm   Oxygen Saturation (Admit) 98 %   Oxygen Saturation (Exercise) 97 %   Oxygen Saturation (Exit) 95 %   Rating of Perceived Exertion (Exercise) 9   Perceived Dyspnea (Exercise) 1   Duration Progress to 45 minutes of aerobic exercise without signs/symptoms of physical distress   Intensity THRR unchanged     Progression   Progression Continue to progress workloads to maintain intensity without signs/symptoms of physical distress.     Resistance Training   Training Prescription Yes   Weight blue bands   Reps 10-12  10 minutes of strength training     Interval Training   Interval Training No     Bike   Level 1.5   Minutes 17     NuStep   Level 6   Minutes 10   METs 2.3     Track   Laps 11   Minutes 17      Functional Capacity:     6 Minute Walk    Row Name 01/27/16 1215         6  Minute Walk   Phase Discharge     Distance 1300 feet     Walk Time 6 minutes     # of Rest Breaks 0     MPH 2.46     METS 2.84     RPE 12     Perceived Dyspnea  1     Symptoms Yes (comment)     Comments backache     Resting HR 76 bpm     Resting BP 124/78     Max Ex. HR 96 bpm     Max Ex. BP 144/60       Interval HR   Baseline HR 76     1 Minute HR 96     2 Minute HR 92     3 Minute HR 96     4 Minute HR 95     5 Minute HR 96     6 Minute HR 96     2 Minute Post HR 83     Interval Heart Rate? Yes       Interval Oxygen   Interval Oxygen? Yes     Baseline Oxygen Saturation % 96 %     Baseline Liters of Oxygen 0  L     1 Minute Oxygen Saturation % 94 %     1 Minute Liters of Oxygen 0 L     2 Minute Oxygen Saturation % 94 %     2 Minute Liters of Oxygen 0 L     3 Minute Oxygen Saturation % 93 %     3 Minute Liters of Oxygen 0 L     4 Minute Oxygen Saturation % 95 %     4 Minute Liters of Oxygen 0 L     5 Minute Oxygen Saturation % 96 %     5 Minute Liters of Oxygen 0 L     6 Minute Oxygen Saturation % 95 %     6 Minute Liters of Oxygen 0 L     2 Minute Post Oxygen Saturation % 97 %     2 Minute Post Liters of Oxygen 0 L        Psychological, QOL, Others - Outcomes: PHQ 2/9: Depression screen [facility] 2/9 01/27/2016 09/19/2015 07/04/2015 07/02/2015  Decreased Interest 0 0 3 0  Down, Depressed, Hopeless 0 0 3 0  PHQ - 2 Score 0 0 6 0  Altered sleeping - - 2 -  Tired, decreased energy - - 3 -  Change in appetite - - 3 -  Feeling bad or failure about yourself  - - 3 -  Trouble concentrating - - 2 -  Moving slowly or fidgety/restless - - 0 -  Suicidal thoughts - - 0 -  PHQ-9 Score - - 19 -  Difficult doing work/chores - - Extremely dIfficult -    Quality of Life:   Personal Goals: Goals established at orientation with interventions provided to work toward goal.    Personal Goals Discharge:     Goals and Risk Factor Review    Row Name 12/30/15 0835 01/27/16  0717           Core Components/Risk Factors/Patient Goals Review   Personal Goals Review Increase Strength and Stamina;Sedentary;Weight Management/Obesity;Tobacco Cessation;Improve shortness of breath with ADL's;Develop more efficient breathing techniques such as purse lipped breathing and diaphragmatic breathing and practicing self-pacing with activity. Increase Strength and Stamina;Sedentary;Weight Management/Obesity;Tobacco Cessation;Improve shortness of breath with ADL's;Develop more efficient breathing techniques such as purse lipped breathing and diaphragmatic breathing and practicing self-pacing with activity.      Review see "comments" section on ITP see "comments" section on ITP      Expected Outcomes See "Admission" expected outcomes See "Admission" expected outcomes         Nutrition & Weight - Outcomes:    Nutrition:   Nutrition Discharge:   Education Questionnaire Score:   Pt has not made expected progress toward pulmonary rehab goals after completing 24 sessions. He has tolerated workload increases however related to his decreased cognitive function has to reminded at each session to exercise. He has not lost weight and does not monitor his diet at home. He is extremely limited by his chronic pain. His wife states he remains sedentary at home unless she continuously encourages him to exercise with her. He is in the process of joining the [facility]. Recommend continued exercise at home, life style modification, education, and utilization of breathing techniques to increase stamina and strength and decrease shortness of breath with exertion.

## 2016-02-23 ENCOUNTER — Ambulatory Visit (INDEPENDENT_AMBULATORY_CARE_PROVIDER_SITE_OTHER): Payer: Medicare Other | Admitting: *Deleted

## 2016-02-23 DIAGNOSIS — I639 Cerebral infarction, unspecified: Secondary | ICD-10-CM

## 2016-02-24 DIAGNOSIS — N320 Bladder-neck obstruction: Secondary | ICD-10-CM | POA: Diagnosis not present

## 2016-02-24 DIAGNOSIS — G35 Multiple sclerosis: Secondary | ICD-10-CM | POA: Diagnosis not present

## 2016-02-24 DIAGNOSIS — R339 Retention of urine, unspecified: Secondary | ICD-10-CM | POA: Diagnosis not present

## 2016-02-24 DIAGNOSIS — Z9079 Acquired absence of other genital organ(s): Secondary | ICD-10-CM | POA: Diagnosis not present

## 2016-02-24 DIAGNOSIS — N319 Neuromuscular dysfunction of bladder, unspecified: Secondary | ICD-10-CM | POA: Diagnosis not present

## 2016-02-24 DIAGNOSIS — C680 Malignant neoplasm of urethra: Secondary | ICD-10-CM | POA: Diagnosis not present

## 2016-02-24 DIAGNOSIS — N318 Other neuromuscular dysfunction of bladder: Secondary | ICD-10-CM | POA: Diagnosis not present

## 2016-02-24 DIAGNOSIS — N3941 Urge incontinence: Secondary | ICD-10-CM | POA: Diagnosis not present

## 2016-02-24 DIAGNOSIS — N401 Enlarged prostate with lower urinary tract symptoms: Secondary | ICD-10-CM | POA: Diagnosis not present

## 2016-02-24 NOTE — Progress Notes (Signed)
Carelink Summary Report / Loop Recorder 

## 2016-03-01 ENCOUNTER — Telehealth: Payer: Self-pay | Admitting: Family Medicine

## 2016-03-01 MED ORDER — ONDANSETRON HCL 4 MG PO TABS: 4.0000 mg | ORAL_TABLET | Freq: Three times a day (TID) | ORAL | 0 refills | Status: DC | PRN

## 2016-03-01 NOTE — Telephone Encounter (Signed)
Medication called/sent to requested pharmacy  

## 2016-03-01 NOTE — Telephone Encounter (Signed)
Patient calling to see if he can get refill on zofran, he is nauseated from his ms  [facility] [place] [phone] (H)

## 2016-03-16 LAB — CUP PACEART REMOTE DEVICE CHECK
MDC IDC PG IMPLANT DT: 20170619
MDC IDC SESS DTM: 20180219183520

## 2016-03-17 ENCOUNTER — Other Ambulatory Visit: Payer: Self-pay | Admitting: Family Medicine

## 2016-03-17 NOTE — Telephone Encounter (Signed)
Pt wants a refill on hydrocodone.

## 2016-03-17 NOTE — Telephone Encounter (Signed)
Ok to refill 

## 2016-03-18 MED ORDER — HYDROCODONE-ACETAMINOPHEN 10-325 MG PO TABS: 1.0000 | ORAL_TABLET | Freq: Four times a day (QID) | ORAL | 0 refills | Status: DC | PRN

## 2016-03-18 NOTE — Telephone Encounter (Signed)
ok 

## 2016-03-18 NOTE — Addendum Note (Signed)
Addended by: Sheral Flow on: 03/18/2016 11:39 AM   Modules accepted: Orders

## 2016-03-18 NOTE — Telephone Encounter (Signed)
Prescription printed and patient made aware to come to office to pick up after 2pm on 03/18/2016 per VM.

## 2016-03-19 ENCOUNTER — Other Ambulatory Visit: Payer: Self-pay | Admitting: Family Medicine

## 2016-03-19 ENCOUNTER — Ambulatory Visit (INDEPENDENT_AMBULATORY_CARE_PROVIDER_SITE_OTHER): Payer: Medicare Other | Admitting: Family Medicine

## 2016-03-19 ENCOUNTER — Encounter: Payer: Self-pay | Admitting: Family Medicine

## 2016-03-19 VITALS — BP 160/70 | HR 74 | Temp 97.9°F | Resp 18 | Ht 71.0 in | Wt 222.0 lb

## 2016-03-19 DIAGNOSIS — Z79899 Other long term (current) drug therapy: Secondary | ICD-10-CM | POA: Diagnosis not present

## 2016-03-19 DIAGNOSIS — I639 Cerebral infarction, unspecified: Secondary | ICD-10-CM | POA: Diagnosis not present

## 2016-03-19 DIAGNOSIS — N390 Urinary tract infection, site not specified: Secondary | ICD-10-CM | POA: Diagnosis not present

## 2016-03-19 DIAGNOSIS — F3170 Bipolar disorder, currently in remission, most recent episode unspecified: Secondary | ICD-10-CM | POA: Diagnosis not present

## 2016-03-19 DIAGNOSIS — N183 Chronic kidney disease, stage 3 unspecified: Secondary | ICD-10-CM

## 2016-03-19 DIAGNOSIS — I10 Essential (primary) hypertension: Secondary | ICD-10-CM

## 2016-03-19 LAB — URINALYSIS, ROUTINE W REFLEX MICROSCOPIC
BILIRUBIN URINE: NEGATIVE
Glucose, UA: NEGATIVE
KETONES UR: NEGATIVE
Nitrite: POSITIVE — AB
PROTEIN: NEGATIVE
SPECIFIC GRAVITY, URINE: 1.01 (ref 1.001–1.035)
pH: 5.5 (ref 5.0–8.0)

## 2016-03-19 LAB — URINALYSIS, MICROSCOPIC ONLY
Casts: NONE SEEN [LPF]
Crystals: NONE SEEN [HPF]
Yeast: NONE SEEN [HPF]

## 2016-03-19 MED ORDER — DOXAZOSIN MESYLATE 2 MG PO TABS: 2.0000 mg | ORAL_TABLET | Freq: Every day | ORAL | 2 refills | Status: DC

## 2016-03-19 MED ORDER — CEPHALEXIN 500 MG PO CAPS: 500.0000 mg | ORAL_CAPSULE | Freq: Three times a day (TID) | ORAL | 0 refills | Status: DC

## 2016-03-19 NOTE — Addendum Note (Signed)
Addended by: Shary Decamp B on: 03/19/2016 03:23 PM   Modules accepted: Orders

## 2016-03-19 NOTE — Progress Notes (Signed)
Subjective:    Patient ID: [name] [name], male    DOB: [birth year], 76 y.o.   MRN: [id number]  Medication Refill     09/04/15 Dr. Chase Caller recently center staff message noting that the patient's creatinine had risen to 1.3. His baseline is around 1. Is having concern that the patient was dehydrated. Therefore I asked him to come in for evaluation. I had asked the patient to temporarily discontinue his Lasix and try to drink more fluid. I have asked him for a urine sample today but he is unable to provide a urine sample. He has been off his Lasix. Therefore his legs are swelling. He has +1 edema in both legs to the knee. He denies any shortness of breath at rest. He has stable chronic dyspnea on exertion likely due to deconditioning as well as his COPD. Although he is here today mainly to recheck his renal function, he seems very lethargic and very sleepy. He honestly appears overmedicated. He is slow to respond to questions. There are no neurologic deficits on his exam. He is not taking any pain medication in the last 48 hours. He denies any other substance use. He is not taking Benadryl. However he is on many centrally acting medications including Abilify as well as ditropan.  At that time, my plan was: Patient is a prediabetic. I will check a urine microalbumin whenever he can give [place] urine sample. His blood pressures at home are borderline. The majority are between 267 and 124 systolic. However I'll recheck the patient's renal function today. I believe the increase in his creatinine was likely due to dehydration, his Lasix pill, and the fact that his wife has been giving him Celebrex on a daily basis for joint pain. We will recheck his renal function today and if his renal function continues to be elevated, we will need to have the patient avoid NSAIDs. I'm more concerned by his lethargy. I believe he is overmedicated. I recommended discontinuing oxybutynin. Reduce Abilify to 2.5 mg a day. Recheck  in one week. Consider a sleep study for obstructive sleep apnea if he continues to be hypersomnolent.  09/11/15 When I repeated the patient's renal function, his creatinine had risen to 1.43. Therefore since early June when his creatinine was 0.9 and his glomerular filtration rate was greater than 60, the patient's glomerular filtration rate had dropped to 43 mL of blood per minute with a creatinine of 1.43 by the end of August. However this is much as up with the initiation of Celebrex for his polyarthralgias. Furthermore the patient was also taking naproxen at home on his own in addition to his Lasix. Over the last week I have had the patient avoid all NSAIDs and also continue to hold his Lasix and push fluids. He is here today for recheck of his renal function. He has a renal ultrasound scheduled for next week. Of note he seems less lethargic and more alert now that he is no longer taking the oxybutynin and on the reduced dose of Abilify.  At that time, my plan was: I really believe the patient was overmedicated at his last visit. Therefore I'll make no changes in his medicine at this time. I am also hopeful that his decline in renal function can be attributed to his overuse of NSAIDs. I explained this in great detail to the patient. He should not use NSAIDs any further. Repeat renal function today. Obtain renal ultrasound. If renal ultrasound is reassuring and his renal function has  improved off the NSAIDs, no further workup is necessary. However we will need to create a plan to help manage the patient's pain. He is also using more than 3000 mg a day of Tylenol coupled with hydrocodone, I believe that this is dangerous to his liver. Therefore we agreed that we will try to do is use tramadol 50 mg 3 times a day on a scheduled basis and then hydrocodone/acetaminophen once a day for breakthrough pain. Both the patient and his wife are comfortable with this. The patient would then avoid any NSAIDs and any Tylenol.  Await the results of his renal function test prior to initiating this plan  12/01/15 Patient was recently seen at the [place]. Total cholesterol was 140. LDL was 72. HDL was 60. Hemoglobin A1c was 6.0. Creatinine was 1.22. Potassium was normal. CBC was within normal limits. Unfortunately blood pressure at home has been averaging 114-643 systolic over 14-27. He denies any chest pain. He is having occasional wheezing. He is no longer taking losartan after he had hyperkalemia in January.  At that time, my plan was: Given his history of chronic kidney disease and hyperkalemia, I will avoid losartan. Although this would twice a day he'll given his prediabetes and history of chronic kidney disease, I am concerned about hyperkalemia. I will avoid hydrochlorthiazide for similar reasons. Therefore I'll increase his bisoprolol to 10 mg a day and recheck blood pressures in one month.  03/19/16 Ultimately, the patient resumed losartan 50 mg a day. His wife has been checking his blood pressures and finding them to be ranging between 670 and 110 systolic. She asked me about this at her visit 2 weeks ago and I suggested that they increase losartan to 100 mg a day. He is been doing this for last 2 weeks. They're here today to review his blood pressures. He has an isolated 034 systolic reading. However the vast majority of systolic blood pressures are 150-160 indicating poor control. Recently he was seen at [facility]. He is having to perform in and out catheterizations of his bladder because of urinary retention however he still taking her better. He was recently treated for urinary tract infection and they're requesting a repeat urinalysis to rule out persistent infection I'm not his echo sure why taking her better particular since he is having to in and out cath. I suggested that they call his urologist do given the patient's polypharmacy and verify that they do in fact want him to take her better. Patient also seems somewhat slowed  today on his reaction time. His wife states that he has been slurring his speech on occasion. At times he seems overmedicated. His wife is resume the Abilify he was taking previously for bipolar. Today the patient seems somewhat hypersomnolent with delayed reaction time and poor concentration. I believe his overmedicated  Past Medical History:  Diagnosis Date  . Arthritis    s/p TKR  . Bladder cancer (HCC)    [facility], [facility] low grade papillary urothileal carcinoma  . BPH (benign prostatic hyperplasia)   . Brugada syndrome    Possible Type II Brugada ECG pattern. No family history of SCD, no syncope, no tachypalpitations.  Marland Kitchen CAD (coronary artery disease)    a. LHC 1/15 - mid LCx 50 and 60%, proximal RCA 50%  . Chronic diastolic CHF (congestive heart failure) ([place])   . CKD (chronic kidney disease)   . COPD (chronic obstructive pulmonary disease) ([place])    Prior heavy smoker. PFTs (3/11): FVC 87%, FEV1 73%,  ratio 0.57, DLCO 75%, TLC 121%. Moderate obstructive defect. PFTs (9/15): Only minimal obstruction, sugPrior heavy smoker. PFTs (3/11): FVC 87%, FEV1 73%, ratio 0.57, DLCO 75%, TLC 121%. Moderate obstructive defect. PFTs (9/15) minimal obstruction, poss asthma component   . Depression    with bipolar tendencies  . DOE (dyspnea on exertion)    a. Myoview 8/09- EF 57%, no ischemia // b. Myoview (3/11) EF 68%, diaphragmatic attenuation, no ischemia //  c Echo (4/11) EF 07-37%, mild diastolic dysfunction, PASP 38 mmHg // d. PFTs 9/15 minimal obstruction  /  e. CT negative for ILD  . GERD (gastroesophageal reflux disease)   . History of Doppler ultrasound    Carotid US 3/11 negative for significant stenosis  //  carotid US 6/17: Mild bilateral plaque, 1-39% ICA  . History of echocardiogram    a. Echo 12/14 Inferior and distal septal HK, mild LVH, EF 50-55%, mild MR, mild LAE  //  b. Echo 6/17: Mild focal basal septal hypertrophy, EF 60-65%, normal wall motion, grade 1 diastolic dysfunction, mild AI  .  HLD (hyperlipidemia)   . HTN (hypertension)   . Low testosterone   . Lung nodule    a. CT in 2015 >> PET in 12/15 not sugg of malignancy   . LVH (left ventricular hypertrophy)    a. Echo 12/14 Inferior and distal septal HK, mild LVH, EF 50-55%, mild MR, mild LAE  . Mild dementia   . Mitral regurgitation    Echo (4/11) with PISA ERO 0.3 cm^2 and regurgitant volume 41 mL (moderate MR). Echo (12/14) with mild MR.   . MS (multiple sclerosis) ([place])   . Right bundle branch block   . Stroke ([place])   . Thoracic aortic aneurysm (HCC)    4.1 cm 2017  . Urinary retention with incomplete bladder emptying    receives botox injections and treatment for BPH through [facility]   Past Surgical History:  Procedure Laterality Date  . APPENDECTOMY    . EP IMPLANTABLE DEVICE N/A 06/23/2015   Procedure: Loop Recorder Insertion;  Surgeon: Deboraha Sprang, MD;  Location: [place] CV LAB;  Service: Cardiovascular;  Laterality: N/A;  . HEMORRHOID SURGERY    . LEFT HEART CATHETERIZATION WITH CORONARY ANGIOGRAM N/A 01/05/2013   Procedure: LEFT HEART CATHETERIZATION WITH CORONARY ANGIOGRAM;  Surgeon: Larey Dresser, MD;  Location: [facility] CATH LAB;  Service: Cardiovascular;  Laterality: N/A;  . ROTATOR CUFF REPAIR    . TEE WITHOUT CARDIOVERSION N/A 06/23/2015   Procedure: TRANSESOPHAGEAL ECHOCARDIOGRAM (TEE);  Surgeon: Fay Records, MD;  Location: [facility] ENDOSCOPY;  Service: Cardiovascular;  Laterality: N/A;  . TONSILLECTOMY    . TOTAL KNEE ARTHROPLASTY Right   . TRANSURETHRAL RESECTION OF PROSTATE     Current Outpatient Prescriptions on File Prior to Visit  Medication Sig Dispense Refill  . acetaminophen (TYLENOL) 325 MG tablet Take 650 mg by mouth every 6 (six) hours as needed.    Marland Kitchen amantadine (SYMMETREL) 100 MG capsule TAKE 1 CAPSULE BY MOUTH TWICE DAILY WITH BREAKFAST AND LUNCH 180 capsule 2  . amLODipine (NORVASC) 10 MG tablet Take 1 tablet (10 mg total) by mouth daily. 90 tablet 3  . ARIPiprazole (ABILIFY) 5 MG  tablet Take 1 tablet (5 mg total) by mouth daily. 90 tablet 2  . atorvastatin (LIPITOR) 20 MG tablet TAKE 1 TABLET DAILY 90 tablet 3  . bisoprolol (ZEBETA) 10 MG tablet Take 1 tablet (10 mg total) by mouth daily. 30 tablet 5  . cholecalciferol (  VITAMIN D) 400 units TABS tablet Take 400 Units by mouth.    . clopidogrel (PLAVIX) 75 MG tablet Take 1 tablet (75 mg total) by mouth daily. 90 tablet 2  . Cyanocobalamin (B-12) 1000 MCG CAPS Take 10,000 mcg by mouth daily.    . folic acid (FOLVITE) 1 MG tablet Take 1 mg by mouth daily.    . furosemide (LASIX) 40 MG tablet Take 1 tablet (40 mg total) by mouth 2 (two) times daily. 180 tablet 1  . HYDROcodone-acetaminophen (NORCO) 10-325 MG tablet Take 1 tablet by mouth every 6 (six) hours as needed for moderate pain or severe pain. 100 tablet 0  . losartan (COZAAR) 50 MG tablet Take 100 mg by mouth daily.     Marland Kitchen MAGNESIUM CITRATE PO Take by mouth.    Marland Kitchen MYRBETRIQ 25 MG TB24 tablet     . ondansetron (ZOFRAN) 4 MG tablet Take 1 tablet (4 mg total) by mouth every 8 (eight) hours as needed for nausea or vomiting. 30 tablet 0  . pantoprazole (PROTONIX) 40 MG tablet Take 1 tablet (40 mg total) by mouth daily. 90 tablet 3  . PROAIR HFA 108 (90 Base) MCG/ACT inhaler USE 1 TO 2 INHALATIONS EVERY 6 HOURS AS NEEDED FOR WHEEZING OR SHORTNESS OF BREATH 25.5 g 4  . rOPINIRole (REQUIP) 1 MG tablet TAKE 1 TABLET AT BEDTIME 90 tablet 3  . SPIRIVA HANDIHALER 18 MCG inhalation capsule INHALE THE CONTENTS OF 1 CAPSULE WITH 2         INHALATIONS ONCE DAILY AS DIRECTED 1 capsule 11  . UNABLE TO FIND Tumeric daily    . venlafaxine XR (EFFEXOR-XR) 150 MG 24 hr capsule TAKE 1 CAPSULE TWICE A DAY 180 capsule 3   No current facility-administered medications on file prior to visit.    Allergies  Allergen Reactions  . Acetylcholine     unknown  . Alcohol-Sulfur [Sulfur]     unknown  . Amitriptyline     unknown  . Bupivacaine     unknown  . Clomipramine Hcl     unknown  .  Cocaine     unknown  . Desipramine     unknown  . Ergonovine     unknown  . Flecainide     unknown  . Lithium     unknown  . Loxapine     unknown  . Nortriptyline     unknown  . Oxcarbazepine     unknown  . Procainamide     unknown  . Procaine     unknwon  . Propafenone     unknown  . Propofol     unknown  . Trifluoperazine     unknown   Social History   Social History  . Marital status: Married    Spouse name: N/A  . Number of children: N/A  . Years of education: N/A   Occupational History  . retired Retired   Social History Main Topics  . Smoking status: Former Smoker    Packs/day: 2.00    Years: 40.00    Types: Cigarettes    Quit date: 01/04/1993  . Smokeless tobacco: Former Systems developer    Types: Chew     Comment: patient still smokes a pipe daily  . Alcohol use No     Comment: 6 pack/day - 06/20/15 Pt states wuit drinking in the 1970's  . Drug use: No  . Sexual activity: Not on file   Other Topics Concern  . Not on file  Social History Narrative   Retired from the [employer] after 20 years   [place] Veteran      Review of Systems  All other systems reviewed and are negative.      Objective:   Physical Exam  Constitutional: He is oriented to person, place, and time.  Cardiovascular: Normal rate, regular rhythm and normal heart sounds.   Pulmonary/Chest: Effort normal and breath sounds normal. No respiratory distress. He has no wheezes. He has no rales.  Abdominal: Soft. Bowel sounds are normal.  Neurological: He is alert and oriented to person, place, and time. He has normal reflexes. No cranial nerve deficit. He exhibits normal muscle tone. Coordination normal.  Psychiatric: He has a normal mood and affect. His speech is normal and behavior is normal. Thought content normal. He is not slowed. Cognition and memory are normal.  Vitals reviewed.         Assessment & Plan:  Urinary tract infection without hematuria, site unspecified - Plan:  Urinalysis, Routine w reflex microscopic Ess htn Polypharmacy Bipolar Disorder Sedation  Decrease Abilify to 2.5 mg by mouth daily for 1 week and then stop the medication. I'm concerned about polypharmacy and overmedication causing sedation and slurred speech. We'll watch the patient closely for this. His blood pressure is too high. I will add Cardura 2 mg by mouth daily to his other medication and recheck his blood pressure in 2-3 weeks. I suggested that they call his urologist at [facility] to verify that they do in fact want him to continue to take her better since he's having to perform in and out catheterizations. It is possible that using this for bladder spasms and pain control but I'm not certain and the patient nor his wife ischemia clear history on the indication for that medication. I will gladly repeat a urinalysis. The patient's unable to use the bathroom at the present time even with the in and out cath. He would like to bring the specimen back later this afternoon which would be fine.

## 2016-03-21 LAB — URINE CULTURE

## 2016-03-24 ENCOUNTER — Ambulatory Visit (INDEPENDENT_AMBULATORY_CARE_PROVIDER_SITE_OTHER): Payer: Medicare Other | Admitting: *Deleted

## 2016-03-24 ENCOUNTER — Telehealth: Payer: Self-pay | Admitting: Family Medicine

## 2016-03-24 DIAGNOSIS — I639 Cerebral infarction, unspecified: Secondary | ICD-10-CM

## 2016-03-24 NOTE — Telephone Encounter (Signed)
Closing encounter

## 2016-03-25 ENCOUNTER — Other Ambulatory Visit: Payer: Self-pay | Admitting: Family Medicine

## 2016-03-25 NOTE — Progress Notes (Signed)
Carelink Summary Report / Loop Recorder 

## 2016-03-30 ENCOUNTER — Telehealth: Payer: Self-pay | Admitting: *Deleted

## 2016-03-30 ENCOUNTER — Encounter: Payer: Self-pay | Admitting: Physician Assistant

## 2016-03-30 ENCOUNTER — Ambulatory Visit (INDEPENDENT_AMBULATORY_CARE_PROVIDER_SITE_OTHER): Payer: Medicare Other | Admitting: Physician Assistant

## 2016-03-30 VITALS — BP 150/70 | HR 70 | Ht 71.0 in | Wt 228.8 lb

## 2016-03-30 DIAGNOSIS — Z8673 Personal history of transient ischemic attack (TIA), and cerebral infarction without residual deficits: Secondary | ICD-10-CM | POA: Diagnosis not present

## 2016-03-30 DIAGNOSIS — I712 Thoracic aortic aneurysm, without rupture, unspecified: Secondary | ICD-10-CM

## 2016-03-30 DIAGNOSIS — I639 Cerebral infarction, unspecified: Secondary | ICD-10-CM

## 2016-03-30 DIAGNOSIS — I5032 Chronic diastolic (congestive) heart failure: Secondary | ICD-10-CM

## 2016-03-30 DIAGNOSIS — R0602 Shortness of breath: Secondary | ICD-10-CM

## 2016-03-30 DIAGNOSIS — I110 Hypertensive heart disease with heart failure: Secondary | ICD-10-CM | POA: Diagnosis not present

## 2016-03-30 DIAGNOSIS — I2510 Atherosclerotic heart disease of native coronary artery without angina pectoris: Secondary | ICD-10-CM

## 2016-03-30 LAB — BASIC METABOLIC PANEL
BUN/Creatinine Ratio: 11 (ref 10–24)
BUN: 12 mg/dL (ref 8–27)
CO2: 23 mmol/L (ref 18–29)
Calcium: 9.2 mg/dL (ref 8.6–10.2)
Chloride: 98 mmol/L (ref 96–106)
Creatinine, Ser: 1.13 mg/dL (ref 0.76–1.27)
GFR calc Af Amer: 74 mL/min/{1.73_m2} (ref >59)
GFR calc non Af Amer: 64 mL/min/{1.73_m2} (ref >59)
GLUCOSE: 85 mg/dL (ref 65–99)
POTASSIUM: 5 mmol/L (ref 3.5–5.2)
SODIUM: 138 mmol/L (ref 134–144)

## 2016-03-30 LAB — CBC
Hematocrit: 40.7 % (ref 37.5–51.0)
Hemoglobin: 13.4 g/dL (ref 13.0–17.7)
MCH: 32.4 pg (ref 26.6–33.0)
MCHC: 32.9 g/dL (ref 31.5–35.7)
MCV: 99 fL — ABNORMAL HIGH (ref 79–97)
PLATELETS: 321 10*3/uL (ref 150–379)
RBC: 4.13 x10E6/uL — AB (ref 4.14–5.80)
RDW: 14 % (ref 12.3–15.4)
WBC: 8 10*3/uL (ref 3.4–10.8)

## 2016-03-30 LAB — PRO B NATRIURETIC PEPTIDE: NT-PRO BNP: 386 pg/mL — AB (ref 0–376)

## 2016-03-30 NOTE — Telephone Encounter (Signed)
-----   Message from Liliane Shi, [place] sent at 03/30/2016  5:19 PM EDT ----- Please call the patient Kidney function and hemoglobin are normal BNP is mildly elevated.  Continue with current treatment plan. Add BNP to repeat BMET next week. Richardson Dopp, PA-C   03/30/2016 5:17 PM

## 2016-03-30 NOTE — Patient Instructions (Addendum)
Medication Instructions:  1. INCREASE LASIX TO 40 MG TWICE DAILY FOR 3 DAYS; AFTER THE 3 DAYS YOU WILL GO BACK TO LASIX 40 MG ONCE A DAY  Labwork: 1. TODAY BMET, CBC, PRO BNP 2. BMET TO BE DONE IN 1 WEEK  Testing/Procedures: YOU WILL NEED TO BE SCHEDULED FOR CHEST MR-A WITH AND W/O CONTRAST AND CARDIAC MORPHOLOGY WITH AND WITH OUT CONTRAST TO BE DONE IN 06/2016  Follow-Up: 1. SCOTT WEAVER, PAC 3-4 WEEKS 2. Your physician wants you to follow-up in: [place] will receive a reminder letter in the mail two months in advance. If you don't receive a letter, please call our office to schedule the follow-up appointment.  Any Other Special Instructions Will Be Listed Below (If Applicable).  If you need a refill on your cardiac medications before your next appointment, please call your pharmacy.

## 2016-03-30 NOTE — Progress Notes (Signed)
Cardiology Office Note:    Date:  03/30/2016   ID:  [name] [name], DOB [birth year], MRN [id number]  PCP:  [name], MD  Cardiologist:  [name]  >> Dr. Harrell Gave End  Electrophysiologist:  N/a Pulmonology: Dr. Chase Caller Neurology: Dr. Erlinda Hong  Referring MD: [name], MD   Chief Complaint  Patient presents with  . Shortness of Breath    History of Present Illness:    [name] [name] is a 76 y.o. male with a hx of mod non-obstructive CAD on LHC in 4/78, diastolic HF, HTN, HL, GERD, COPD, prior stroke, AKI assoc with NSAID use.  He has struggled with bipolar depression.   Admitted 6/17 with a R MCA distribution stroke. TEE demonstrated no PFO, no LAA clot and normal thoracic aorta. He had a Linq ILR implanted by Dr. [name].   He returns for Cardiology follow up.  He is here with his wife.  He has completed pulmonary rehab.  Over the past 6 mos, he has noted increasing dyspnea on exertion.  He denies orthopnea, PND.  He has noted increasing LE edema.  His weight is unchanged.  He has a chronic cough.  Denies any bleeding issues.  He has frequent UTIs and just finished antibiotics.  He denies chest pain.    Prior CV studies:   The following studies were reviewed today:  Echo 06/16/15 Mild focal basal septal hypertrophy, EF 60-65%, normal wall motion, grade 1 diastolic dysfunction, mild AI  Carotid US 06/16/15 Summary:Bilateral: mild soft plaque CCA and origin ICA. 1-39% ICA plaquing.  Chest CT 6/17 IMPRESSION: 1. The nodules are stable from 06/26/2013. More spiculated nodules inferomedially in the right middle lobe were not previously positive on PET scan and accordingly are likely benign. The 5 by 6 mm nodule is also stable over the past 2 years. This 2 year stability is compatible with benign process, and based on current guidelines, further follow up surveillance is not required. 2. 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 Recommend  annual imaging followup by CTA or MRA. This recommendation follows 2010 ACCF/AHA/AATS/ACR/ASA/SCA/SCAI/SIR/STS/SVM Guidelines for the Diagnosis and Management of Patients with Thoracic Aortic Disease. Circulation. 2010; 121: G956-O130 3. Coronary, aortic arch, and branch vessel atherosclerotic vascular disease.  LHC 1/15 LM No significant disease.  LAD: Luminal irregularities.  LCx: serial 50% and 60% stenoses in the mid LCx.  RCA: 50% proximal RCA stenosis.  LVEF is estimated at 55% without significant wall motion abnormalities noted in RAO projection.  Final Conclusions: There is moderate coronary disease involving the mid LCx and the proximal RCA. There are no severe stenoses. I do not think that coronary disease is causing the patient's exertional dyspnea. His LVEDP also is not elevated on the higher Lasix dose. I suspect that at this point COPD is the major cause of his dyspnea.   Echo 12/14 Inferior and distal septal HK, mild LVH, EF 50-55%, mild MR, mild LAE  Past Medical History:  Diagnosis Date  . Arthritis    s/p TKR  . Bladder cancer (HCC)    [facility], Ta low grade papillary urothileal carcinoma  . BPH (benign prostatic hyperplasia)   . Brugada syndrome    Possible Type II Brugada ECG pattern. No family history of SCD, no syncope, no tachypalpitations.  Marland Kitchen CAD (coronary artery disease)    a. LHC 1/15 - mid LCx 50 and 60%, proximal RCA 50%  . Chronic diastolic CHF (congestive heart failure) ([place])   . CKD (chronic  kidney disease)   . COPD (chronic obstructive pulmonary disease) ([place])    Prior heavy smoker. PFTs (3/11): FVC 87%, FEV1 73%, ratio 0.57, DLCO 75%, TLC 121%. Moderate obstructive defect. PFTs (9/15): Only minimal obstruction, sugPrior heavy smoker. PFTs (3/11): FVC 87%, FEV1 73%, ratio 0.57, DLCO 75%, TLC 121%. Moderate obstructive defect. PFTs (9/15) minimal obstruction, poss asthma component   . Depression    with bipolar tendencies  . DOE (dyspnea on  exertion)    a. Myoview 8/09- EF 57%, no ischemia // b. Myoview (3/11) EF 68%, diaphragmatic attenuation, no ischemia //  c Echo (4/11) EF 97-98%, mild diastolic dysfunction, PASP 38 mmHg // d. PFTs 9/15 minimal obstruction  /  e. CT negative for ILD  . GERD (gastroesophageal reflux disease)   . History of Doppler ultrasound    Carotid US 3/11 negative for significant stenosis  //  carotid US 6/17: Mild bilateral plaque, 1-39% ICA  . History of echocardiogram    a. Echo 12/14 Inferior and distal septal HK, mild LVH, EF 50-55%, mild MR, mild LAE  //  b. Echo 6/17: Mild focal basal septal hypertrophy, EF 60-65%, normal wall motion, grade 1 diastolic dysfunction, mild AI  . HLD (hyperlipidemia)   . Hypertensive heart disease with CHF (congestive heart failure) ([place]) 04/08/2009  . Low testosterone   . Lung nodule    a. CT in 2015 >> PET in 12/15 not sugg of malignancy   . LVH (left ventricular hypertrophy)    a. Echo 12/14 Inferior and distal septal HK, mild LVH, EF 50-55%, mild MR, mild LAE  . Mild dementia   . Mitral regurgitation    Echo (4/11) with PISA ERO 0.3 cm^2 and regurgitant volume 41 mL (moderate MR). Echo (12/14) with mild MR.   . MS (multiple sclerosis) ([place])   . MS (multiple sclerosis) ([place])   . Right bundle branch block   . Stroke ([place])   . Thoracic aortic aneurysm (HCC)    4.1 cm 2017  . Urinary retention with incomplete bladder emptying    receives botox injections and treatment for BPH through [facility]    Past Surgical History:  Procedure Laterality Date  . APPENDECTOMY    . EP IMPLANTABLE DEVICE N/A 06/23/2015   Procedure: Loop Recorder Insertion;  Surgeon: Deboraha Sprang, MD;  Location: [facility];  Service: Cardiovascular;  Laterality: N/A;  . HEMORRHOID SURGERY    . LEFT HEART CATHETERIZATION WITH CORONARY ANGIOGRAM N/A 01/05/2013   Procedure: LEFT HEART CATHETERIZATION WITH CORONARY ANGIOGRAM;  Surgeon: Larey Dresser, MD;  Location[facility] CATH LAB;  Service:  Cardiovascular;  Laterality: N/A;  . ROTATOR CUFF REPAIR    . TEE WITHOUT CARDIOVERSION N/A 06/23/2015   Procedure: TRANSESOPHAGEAL ECHOCARDIOGRAM (TEE);  Surgeon: Fay Records, MD;  Location: [facility] ENDOSCOPY;  Service: Cardiovascular;  Laterality: N/A;  . TONSILLECTOMY    . TOTAL KNEE ARTHROPLASTY Right   . TRANSURETHRAL RESECTION OF PROSTATE      Current Medications: Current Meds  Medication Sig  . acetaminophen (TYLENOL) 325 MG tablet Take 650 mg by mouth every 6 (six) hours as needed.  Marland Kitchen amantadine (SYMMETREL) 100 MG capsule TAKE 1 CAPSULE BY MOUTH TWICE DAILY WITH BREAKFAST AND LUNCH  . amLODipine (NORVASC) 10 MG tablet Take 1 tablet (10 mg total) by mouth daily.  Marland Kitchen atorvastatin (LIPITOR) 20 MG tablet TAKE 1 TABLET DAILY  . bisoprolol (ZEBETA) 10 MG tablet Take 1 tablet (10 mg total) by mouth daily.  . cholecalciferol (  VITAMIN D) 400 units TABS tablet Take 400 Units by mouth.  . clopidogrel (PLAVIX) 75 MG tablet Take 1 tablet (75 mg total) by mouth daily.  . Cyanocobalamin (B-12) 1000 MCG CAPS Take 10,000 mcg by mouth daily.  Marland Kitchen doxazosin (CARDURA) 2 MG tablet Take 1 tablet (2 mg total) by mouth daily.  . folic acid (FOLVITE) 1 MG tablet Take 1 mg by mouth daily.  . furosemide (LASIX) 40 MG tablet Take 40 mg by mouth daily.  Marland Kitchen HYDROcodone-acetaminophen (NORCO) 10-325 MG tablet Take 1 tablet by mouth every 6 (six) hours as needed for moderate pain or severe pain.  Marland Kitchen losartan (COZAAR) 50 MG tablet Take 100 mg by mouth daily.   Marland Kitchen MAGNESIUM CITRATE PO Take by mouth.  . ondansetron (ZOFRAN) 4 MG tablet TAKE 1 TABLET(4 MG) BY MOUTH EVERY 8 HOURS AS NEEDED FOR NAUSEA OR VOMITING  . pantoprazole (PROTONIX) 40 MG tablet Take 1 tablet (40 mg total) by mouth daily.  Marland Kitchen PROAIR HFA 108 (90 Base) MCG/ACT inhaler USE 1 TO 2 INHALATIONS EVERY 6 HOURS AS NEEDED FOR WHEEZING OR SHORTNESS OF BREATH  . rOPINIRole (REQUIP) 1 MG tablet TAKE 1 TABLET AT BEDTIME  . SPIRIVA HANDIHALER 18 MCG inhalation capsule  INHALE THE CONTENTS OF 1 CAPSULE WITH 2         INHALATIONS ONCE DAILY AS DIRECTED  . UNABLE TO FIND Tumeric daily  . venlafaxine XR (EFFEXOR-XR) 150 MG 24 hr capsule TAKE 1 CAPSULE TWICE A DAY     Allergies:   Acetylcholine; Alcohol-sulfur [sulfur]; Amitriptyline; Bupivacaine; Clomipramine hcl; Cocaine; Desipramine; Ergonovine; Flecainide; Lithium; Loxapine; Nortriptyline; Oxcarbazepine; Procainamide; Procaine; Propafenone; Propofol; and Trifluoperazine   Social History   Social History  . Marital status: Married    Spouse name: N/A  . Number of children: N/A  . Years of education: N/A   Occupational History  . retired Retired   Social History Main Topics  . Smoking status: Former Smoker    Packs/day: 2.00    Years: 40.00    Types: Cigarettes    Quit date: 01/04/1993  . Smokeless tobacco: Former Systems developer    Types: Chew     Comment: patient still smokes a pipe daily  . Alcohol use No     Comment: 6 pack/day - 06/20/15 Pt states wuit drinking in the 1970's  . Drug use: No  . Sexual activity: Not Asked   Other Topics Concern  . None   Social History Narrative   Retired from the [employer] after 20 years   [place] Veteran     Family History  Problem Relation Age of Onset  . Stroke Father   . Stroke Mother   . Hypertension Sister   . Kidney cancer Brother   . Arthritis Brother   . Other Brother     knee problems, Bil TKR  . Hypertension      family history     ROS:   Please see the history of present illness.    Review of Systems  Cardiovascular: Positive for dyspnea on exertion and leg swelling.  Respiratory: Positive for shortness of breath and wheezing.   Neurological: Positive for dizziness and loss of balance.   All other systems reviewed and are negative.   EKGs/Labs/Other Test Reviewed:    EKG:  EKG is  ordered today.  The ekg ordered today demonstrates NSR, HR 70, LAD, inc RBBB, QTc 432 ms, no sig change from prior tracing.   Recent Labs: 06/26/2015:  ALT 29;  Hemoglobin 14.8; Platelets 384 09/11/2015: BUN 18; Creat 0.90; Potassium 4.8; Sodium 136   Recent Lipid Panel    Component Value Date/Time   CHOL 140 06/21/2015 0534   TRIG 239 (H) 06/21/2015 0534   HDL 33 (L) 06/21/2015 0534   CHOLHDL 4.2 06/21/2015 0534   VLDL 48 (H) 06/21/2015 0534   LDLCALC 59 06/21/2015 0534   LDLDIRECT 87.0 02/01/2014 1019     Physical Exam:    VS:  BP (!) 150/70   Pulse 70   Ht 5\' 11"  (1.803 m)   Wt 228 lb 12.8 oz (103.8 kg)   BMI 31.91 kg/m     Wt Readings from Last 3 Encounters:  03/30/16 228 lb 12.8 oz (103.8 kg)  03/19/16 222 lb (100.7 kg)  01/20/16 228 lb 6.3 oz (103.6 kg)     Physical Exam  Constitutional: He is oriented to person, place, and time. He appears well-developed and well-nourished. No distress.  HENT:  Head: Normocephalic and atraumatic.  Eyes: No scleral icterus.  Neck: Normal range of motion. JVD (JVP 6 cm) present.  Cardiovascular: Normal rate, regular rhythm, S1 normal and S2 normal.   No murmur heard. Pulmonary/Chest: Effort normal. He has decreased breath sounds. He has no wheezes. He has no rhonchi. He has rales (?bibasilar crackles - faint).  Abdominal: Soft. There is no tenderness.  Musculoskeletal: He exhibits edema (1+ bilat LE).  Neurological: He is alert and oriented to person, place, and time.  Skin: Skin is warm and dry.  Psychiatric: He has a normal mood and affect.    ASSESSMENT:    1. Shortness of breath   2. Chronic diastolic CHF (congestive heart failure) ([place])   3. Coronary artery disease involving native coronary artery of native heart without angina pectoris   4. Hypertensive heart disease with CHF (congestive heart failure) ([place])   5. History of ischemic right MCA stroke   6. Thoracic aortic aneurysm without rupture ([place])    PLAN:    In order of problems listed above:  1. Shortness of breath -  He has chronic dyspnea that is likely multifactorial.  He has COPD, CHF, MS, deconditioning.   He does exhibit some signs of volume excess on exam.  He also has a hx of mod non-obs CAD.  -  BMET, CBC, BNP today  -  Increase Lasix to 40 mg bid x 3 days, then resume 40 mg QD  -  BMET in 1 week  -  If BNP high, continue higher dose of Lasix  -  If BNP normal, consider arranging nuclear stress test  -  FU with me in 3-4 weeks.   2. Chronic diastolic CHF (congestive heart failure) ([place]) -   As noted, I suspect his volume excess is driving his dyspnea on exertion.  Increase Lasix x 3 days as noted.  FU as planned.   3. Coronary artery disease involving native coronary artery of native heart without angina pectoris -  Mod non-obs CAD by LHC in 2015.  If his BNP is normal or his dyspnea on exertion worsens, I will arrange a Myocardial Perfusion Imaging Study.  Continue Plavix, statin.    4. Hypertensive heart disease with CHF (congestive heart failure) (HCC) -  BP is optimal at home.  He has not had any medications yet today.  5. History of ischemic right MCA stroke -  s/p ILR.  No AF detected to date.  6. Thoracic aortic aneurysm without rupture (HCC) - 4.1 cm by  CT in 6/17.  MRI/MRA planned in 6/18 for follow up.     Dispo:  Return in about 4 weeks (around 04/27/2016) for Close Follow Up, w/ Richardson Dopp, PA-C.   Medication Adjustments/Labs and Tests Ordered: Current medicines are reviewed at length with the patient today.  Concerns regarding medicines are outlined above.  Medication changes, Labs and Tests ordered today are outlined in the Patient Instructions noted below. Patient Instructions  Medication Instructions:  1. INCREASE LASIX TO 40 MG TWICE DAILY FOR 3 DAYS; AFTER THE 3 DAYS YOU WILL GO BACK TO LASIX 40 MG ONCE A DAY  Labwork: 1. TODAY BMET, CBC, PRO BNP 2. BMET TO BE DONE IN 1 WEEK  Testing/Procedures: YOU WILL NEED TO BE SCHEDULED FOR CHEST MR-A WITH AND W/O CONTRAST AND CARDIAC MORPHOLOGY WITH AND WITH OUT CONTRAST TO BE DONE IN 06/2016  Follow-Up: 1. Canda Podgorski, PAC 3-4 WEEKS 2. Your physician wants you to follow-up in: [facility] will receive a reminder letter in the mail two months in advance. If you don't receive a letter, please call our office to schedule the follow-up appointment.  Any Other Special Instructions Will Be Listed Below (If Applicable).  If you need a refill on your cardiac medications before your next appointment, please call your pharmacy.  Signed, Richardson Dopp, PA-C  03/30/2016 10:19 AM    [facility] [place], [place], [place]  [address] Phone: [phone]; Fax: [phone]

## 2016-03-30 NOTE — Telephone Encounter (Signed)
Lmtcb to go over lab results 

## 2016-03-31 NOTE — Telephone Encounter (Signed)
DPR for pt's wife. I s/w pt's wife about lab results for the pt and findings. Mrs. Gavigan verbalized understanding to results given today. Repeat labs to be done 04/07/16 BMET, Pro BNP.

## 2016-03-31 NOTE — Telephone Encounter (Signed)
-----   Message from Liliane Shi, [place] sent at 03/30/2016  5:19 PM EDT ----- Please call the patient Kidney function and hemoglobin are normal BNP is mildly elevated.  Continue with current treatment plan. Add BNP to repeat BMET next week. Richardson Dopp, PA-C   03/30/2016 5:17 PM

## 2016-03-31 NOTE — Telephone Encounter (Signed)
°  New Message   pt wife verbalized that she is returning call for rn for lab results

## 2016-03-31 NOTE — Telephone Encounter (Signed)
Lmtcb x 2 to go over lab results 

## 2016-04-01 ENCOUNTER — Other Ambulatory Visit: Payer: Medicare Other

## 2016-04-01 ENCOUNTER — Ambulatory Visit: Payer: Medicare Other | Admitting: Family Medicine

## 2016-04-01 DIAGNOSIS — N390 Urinary tract infection, site not specified: Secondary | ICD-10-CM

## 2016-04-01 LAB — URINALYSIS, MICROSCOPIC ONLY
Casts: NONE SEEN [LPF]
Crystals: NONE SEEN [HPF]
Squamous Epithelial / LPF: NONE SEEN [HPF] (ref <=5)
WBC, UA: >60 WBC/HPF — AB (ref <=5)
YEAST: NONE SEEN [HPF]

## 2016-04-01 LAB — URINALYSIS, ROUTINE W REFLEX MICROSCOPIC
Bilirubin Urine: NEGATIVE
Glucose, UA: NEGATIVE
HGB URINE DIPSTICK: NEGATIVE
Ketones, ur: NEGATIVE
NITRITE: NEGATIVE
PROTEIN: NEGATIVE
SPECIFIC GRAVITY, URINE: 1.01 (ref 1.001–1.035)
pH: 5.5 (ref 5.0–8.0)

## 2016-04-03 LAB — URINE CULTURE

## 2016-04-04 ENCOUNTER — Inpatient Hospital Stay (HOSPITAL_COMMUNITY)
Admission: EM | Admit: 2016-04-04 | Discharge: 2016-04-06 | DRG: 062 | Disposition: A | Discharge status: Home / Self Care | Payer: Medicare Other | Attending: Neurology | Admitting: Neurology

## 2016-04-04 ENCOUNTER — Emergency Department (HOSPITAL_COMMUNITY): Payer: Medicare Other

## 2016-04-04 ENCOUNTER — Encounter (HOSPITAL_COMMUNITY): Payer: Self-pay | Admitting: Emergency Medicine

## 2016-04-04 DIAGNOSIS — I110 Hypertensive heart disease with heart failure: Secondary | ICD-10-CM | POA: Diagnosis present

## 2016-04-04 DIAGNOSIS — I63213 Cerebral infarction due to unspecified occlusion or stenosis of bilateral vertebral arteries: Secondary | ICD-10-CM | POA: Diagnosis not present

## 2016-04-04 DIAGNOSIS — Z888 Allergy status to other drugs, medicaments and biological substances status: Secondary | ICD-10-CM | POA: Diagnosis not present

## 2016-04-04 DIAGNOSIS — R4781 Slurred speech: Secondary | ICD-10-CM | POA: Diagnosis present

## 2016-04-04 DIAGNOSIS — I2510 Atherosclerotic heart disease of native coronary artery without angina pectoris: Secondary | ICD-10-CM | POA: Diagnosis present

## 2016-04-04 DIAGNOSIS — I69351 Hemiplegia and hemiparesis following cerebral infarction affecting right dominant side: Secondary | ICD-10-CM | POA: Diagnosis not present

## 2016-04-04 DIAGNOSIS — Z8551 Personal history of malignant neoplasm of bladder: Secondary | ICD-10-CM | POA: Diagnosis not present

## 2016-04-04 DIAGNOSIS — R29810 Facial weakness: Secondary | ICD-10-CM | POA: Diagnosis present

## 2016-04-04 DIAGNOSIS — R0602 Shortness of breath: Secondary | ICD-10-CM | POA: Diagnosis not present

## 2016-04-04 DIAGNOSIS — I5032 Chronic diastolic (congestive) heart failure: Secondary | ICD-10-CM | POA: Diagnosis present

## 2016-04-04 DIAGNOSIS — R471 Dysarthria and anarthria: Secondary | ICD-10-CM | POA: Diagnosis present

## 2016-04-04 DIAGNOSIS — E785 Hyperlipidemia, unspecified: Secondary | ICD-10-CM | POA: Diagnosis present

## 2016-04-04 DIAGNOSIS — I63411 Cerebral infarction due to embolism of right middle cerebral artery: Principal | ICD-10-CM | POA: Diagnosis present

## 2016-04-04 DIAGNOSIS — Z96651 Presence of right artificial knee joint: Secondary | ICD-10-CM | POA: Diagnosis present

## 2016-04-04 DIAGNOSIS — F17290 Nicotine dependence, other tobacco product, uncomplicated: Secondary | ICD-10-CM | POA: Diagnosis present

## 2016-04-04 DIAGNOSIS — I130 Hypertensive heart and chronic kidney disease with heart failure and stage 1 through stage 4 chronic kidney disease, or unspecified chronic kidney disease: Secondary | ICD-10-CM | POA: Diagnosis present

## 2016-04-04 DIAGNOSIS — Z8249 Family history of ischemic heart disease and other diseases of the circulatory system: Secondary | ICD-10-CM | POA: Diagnosis not present

## 2016-04-04 DIAGNOSIS — K219 Gastro-esophageal reflux disease without esophagitis: Secondary | ICD-10-CM | POA: Diagnosis present

## 2016-04-04 DIAGNOSIS — Z87891 Personal history of nicotine dependence: Secondary | ICD-10-CM | POA: Diagnosis not present

## 2016-04-04 DIAGNOSIS — G459 Transient cerebral ischemic attack, unspecified: Secondary | ICD-10-CM | POA: Diagnosis not present

## 2016-04-04 DIAGNOSIS — F329 Major depressive disorder, single episode, unspecified: Secondary | ICD-10-CM | POA: Diagnosis present

## 2016-04-04 DIAGNOSIS — Z6830 Body mass index (BMI) 30.0-30.9, adult: Secondary | ICD-10-CM

## 2016-04-04 DIAGNOSIS — I639 Cerebral infarction, unspecified: Secondary | ICD-10-CM | POA: Diagnosis not present

## 2016-04-04 DIAGNOSIS — G35 Multiple sclerosis: Secondary | ICD-10-CM | POA: Diagnosis present

## 2016-04-04 DIAGNOSIS — Z823 Family history of stroke: Secondary | ICD-10-CM

## 2016-04-04 DIAGNOSIS — I6310 Cerebral infarction due to embolism of unspecified precerebral artery: Secondary | ICD-10-CM | POA: Diagnosis not present

## 2016-04-04 DIAGNOSIS — I6789 Other cerebrovascular disease: Secondary | ICD-10-CM | POA: Diagnosis not present

## 2016-04-04 DIAGNOSIS — I712 Thoracic aortic aneurysm, without rupture, unspecified: Secondary | ICD-10-CM | POA: Diagnosis present

## 2016-04-04 DIAGNOSIS — E669 Obesity, unspecified: Secondary | ICD-10-CM | POA: Diagnosis present

## 2016-04-04 DIAGNOSIS — G35D Multiple sclerosis, unspecified: Secondary | ICD-10-CM | POA: Diagnosis present

## 2016-04-04 DIAGNOSIS — R4189 Other symptoms and signs involving cognitive functions and awareness: Secondary | ICD-10-CM | POA: Diagnosis present

## 2016-04-04 DIAGNOSIS — J449 Chronic obstructive pulmonary disease, unspecified: Secondary | ICD-10-CM | POA: Diagnosis present

## 2016-04-04 DIAGNOSIS — J438 Other emphysema: Secondary | ICD-10-CM | POA: Diagnosis not present

## 2016-04-04 DIAGNOSIS — Z79899 Other long term (current) drug therapy: Secondary | ICD-10-CM

## 2016-04-04 DIAGNOSIS — Z8673 Personal history of transient ischemic attack (TIA), and cerebral infarction without residual deficits: Secondary | ICD-10-CM

## 2016-04-04 DIAGNOSIS — I340 Nonrheumatic mitral (valve) insufficiency: Secondary | ICD-10-CM | POA: Diagnosis not present

## 2016-04-04 DIAGNOSIS — N189 Chronic kidney disease, unspecified: Secondary | ICD-10-CM | POA: Diagnosis present

## 2016-04-04 DIAGNOSIS — I6340 Cerebral infarction due to embolism of unspecified cerebral artery: Secondary | ICD-10-CM | POA: Diagnosis present

## 2016-04-04 DIAGNOSIS — F32A Depression, unspecified: Secondary | ICD-10-CM | POA: Diagnosis present

## 2016-04-04 LAB — DIFFERENTIAL
BASOS PCT: 1 %
Basophils Absolute: 0.1 10*3/uL (ref 0.0–0.1)
EOS ABS: 0.7 10*3/uL (ref 0.0–0.7)
Eosinophils Relative: 9 %
Lymphocytes Relative: 13 %
Lymphs Abs: 1 10*3/uL (ref 0.7–4.0)
Monocytes Absolute: 0.5 10*3/uL (ref 0.1–1.0)
Monocytes Relative: 7 %
NEUTROS ABS: 5.3 10*3/uL (ref 1.7–7.7)
NEUTROS PCT: 70 %

## 2016-04-04 LAB — CBG MONITORING, ED: Glucose-Capillary: 105 mg/dL — ABNORMAL HIGH (ref 65–99)

## 2016-04-04 LAB — COMPREHENSIVE METABOLIC PANEL
ALBUMIN: 4.1 g/dL (ref 3.5–5.0)
ALT: 15 U/L — ABNORMAL LOW (ref 17–63)
AST: 23 U/L (ref 15–41)
Alkaline Phosphatase: 74 U/L (ref 38–126)
Anion gap: 9 (ref 5–15)
BUN: 17 mg/dL (ref 6–20)
CHLORIDE: 101 mmol/L (ref 101–111)
CO2: 26 mmol/L (ref 22–32)
CREATININE: 1.34 mg/dL — AB (ref 0.61–1.24)
Calcium: 8.8 mg/dL — ABNORMAL LOW (ref 8.9–10.3)
GFR calc Af Amer: 59 mL/min — ABNORMAL LOW (ref >60)
GFR calc non Af Amer: 51 mL/min — ABNORMAL LOW (ref >60)
GLUCOSE: 112 mg/dL — AB (ref 65–99)
POTASSIUM: 4.5 mmol/L (ref 3.5–5.1)
SODIUM: 136 mmol/L (ref 135–145)
Total Bilirubin: 0.6 mg/dL (ref 0.3–1.2)
Total Protein: 7 g/dL (ref 6.5–8.1)

## 2016-04-04 LAB — I-STAT CHEM 8, ED
BUN: 21 mg/dL — ABNORMAL HIGH (ref 6–20)
Calcium, Ion: 1.06 mmol/L — ABNORMAL LOW (ref 1.15–1.40)
Chloride: 99 mmol/L — ABNORMAL LOW (ref 101–111)
Creatinine, Ser: 1.3 mg/dL — ABNORMAL HIGH (ref 0.61–1.24)
Glucose, Bld: 108 mg/dL — ABNORMAL HIGH (ref 65–99)
HCT: 43 % (ref 39.0–52.0)
Hemoglobin: 14.6 g/dL (ref 13.0–17.0)
POTASSIUM: 4.4 mmol/L (ref 3.5–5.1)
SODIUM: 138 mmol/L (ref 135–145)
TCO2: 29 mmol/L (ref 0–100)

## 2016-04-04 LAB — I-STAT TROPONIN, ED: TROPONIN I, POC: 0 ng/mL (ref 0.00–0.08)

## 2016-04-04 LAB — CBC
HCT: 42.7 % (ref 39.0–52.0)
Hemoglobin: 13.8 g/dL (ref 13.0–17.0)
MCH: 32.1 pg (ref 26.0–34.0)
MCHC: 32.3 g/dL (ref 30.0–36.0)
MCV: 99.3 fL (ref 78.0–100.0)
PLATELETS: 306 10*3/uL (ref 150–400)
RBC: 4.3 MIL/uL (ref 4.22–5.81)
RDW: 13.7 % (ref 11.5–15.5)
WBC: 7.5 10*3/uL (ref 4.0–10.5)

## 2016-04-04 LAB — PROTIME-INR
INR: 0.97
PROTHROMBIN TIME: 12.9 s (ref 11.4–15.2)

## 2016-04-04 LAB — MRSA PCR SCREENING: MRSA BY PCR: NEGATIVE

## 2016-04-04 LAB — APTT: aPTT: 28 seconds (ref 24–36)

## 2016-04-04 MED ORDER — DOXAZOSIN MESYLATE 2 MG PO TABS
2.0000 mg | ORAL_TABLET | Freq: Every day | ORAL | Status: DC
  Administered 2016-04-05 – 2016-04-06 (×2): 2 mg via ORAL
  Filled 2016-04-04 (×2): qty 1

## 2016-04-04 MED ORDER — ACETAMINOPHEN 160 MG/5ML PO SOLN: 650.0000 mg | ORAL | Status: DC | PRN

## 2016-04-04 MED ORDER — ACETAMINOPHEN 325 MG PO TABS: 650.0000 mg | ORAL_TABLET | ORAL | Status: DC | PRN

## 2016-04-04 MED ORDER — VENLAFAXINE HCL ER 75 MG PO CP24
150.0000 mg | ORAL_CAPSULE | Freq: Two times a day (BID) | ORAL | Status: DC
  Administered 2016-04-04 – 2016-04-06 (×5): 150 mg via ORAL
  Filled 2016-04-04: qty 2
  Filled 2016-04-04 (×4): qty 1
  Filled 2016-04-04: qty 2

## 2016-04-04 MED ORDER — FUROSEMIDE 40 MG PO TABS
40.0000 mg | ORAL_TABLET | Freq: Every day | ORAL | Status: DC
  Administered 2016-04-05 – 2016-04-06 (×2): 40 mg via ORAL
  Filled 2016-04-04: qty 1
  Filled 2016-04-04: qty 2

## 2016-04-04 MED ORDER — SODIUM CHLORIDE 0.9 % IV BOLUS (SEPSIS)
500.0000 mL | Freq: Once | INTRAVENOUS | Status: AC
  Administered 2016-04-04: 500 mL via INTRAVENOUS

## 2016-04-04 MED ORDER — STROKE: EARLY STAGES OF RECOVERY BOOK
Freq: Once | Status: AC
  Administered 2016-04-04: 14:00:00
  Filled 2016-04-04 (×2): qty 1

## 2016-04-04 MED ORDER — PANTOPRAZOLE SODIUM 40 MG IV SOLR
40.0000 mg | Freq: Every day | INTRAVENOUS | Status: DC
  Administered 2016-04-04 – 2016-04-05 (×2): 40 mg via INTRAVENOUS
  Filled 2016-04-04 (×2): qty 40

## 2016-04-04 MED ORDER — PANTOPRAZOLE SODIUM 40 MG PO TBEC: 40.0000 mg | DELAYED_RELEASE_TABLET | Freq: Every day | ORAL | Status: DC

## 2016-04-04 MED ORDER — ALTEPLASE (STROKE) FULL DOSE INFUSION
90.0000 mg | Freq: Once | INTRAVENOUS | Status: AC
  Administered 2016-04-04: 81 mg via INTRAVENOUS

## 2016-04-04 MED ORDER — TIOTROPIUM BROMIDE MONOHYDRATE 18 MCG IN CAPS
18.0000 ug | ORAL_CAPSULE | Freq: Every day | RESPIRATORY_TRACT | Status: DC
  Administered 2016-04-04 – 2016-04-05 (×2): 18 ug via RESPIRATORY_TRACT
  Filled 2016-04-04: qty 5

## 2016-04-04 MED ORDER — ATORVASTATIN CALCIUM 10 MG PO TABS
20.0000 mg | ORAL_TABLET | Freq: Every day | ORAL | Status: DC
  Administered 2016-04-04 – 2016-04-05 (×2): 20 mg via ORAL
  Filled 2016-04-04 (×2): qty 1

## 2016-04-04 MED ORDER — ALBUTEROL SULFATE (2.5 MG/3ML) 0.083% IN NEBU: 3.0000 mL | INHALATION_SOLUTION | Freq: Four times a day (QID) | RESPIRATORY_TRACT | Status: DC | PRN

## 2016-04-04 MED ORDER — IOPAMIDOL (ISOVUE-370) INJECTION 76%
INTRAVENOUS | Status: AC
  Administered 2016-04-04: 50 mL
  Filled 2016-04-04: qty 100

## 2016-04-04 MED ORDER — SODIUM CHLORIDE 0.9 % IV SOLN: INTRAVENOUS | Status: DC

## 2016-04-04 MED ORDER — ACETAMINOPHEN 650 MG RE SUPP: 650.0000 mg | RECTAL | Status: DC | PRN

## 2016-04-04 MED ORDER — ROPINIROLE HCL 1 MG PO TABS
1.0000 mg | ORAL_TABLET | Freq: Every day | ORAL | Status: DC
  Administered 2016-04-04 – 2016-04-05 (×2): 1 mg via ORAL
  Filled 2016-04-04 (×3): qty 1

## 2016-04-04 MED ORDER — AMANTADINE HCL 100 MG PO CAPS
100.0000 mg | ORAL_CAPSULE | Freq: Two times a day (BID) | ORAL | Status: DC
  Administered 2016-04-04 – 2016-04-06 (×4): 100 mg via ORAL
  Filled 2016-04-04 (×6): qty 1

## 2016-04-04 NOTE — ED Notes (Signed)
ED Provider at bedside. 

## 2016-04-04 NOTE — Progress Notes (Signed)
Code stroke called at 0755, Patient arrived to [facility] ED via [facility].  AS per patient LSN 0630 and around 0715 noticed he was not able to use his right leg.  He has had slurred speech that has been unchanged for the last month or so.  TPA was given at 0841, NIHSS 6.

## 2016-04-04 NOTE — H&P (Signed)
Neurology H&P  CC: Right sided weakness  History is obtained from:patient  HPI: [name] [name] is a 76 y.o. male with a history of stroke, CAD, CHF who presents with right leg weakness that started acutely around 7 AM. He states that he got up at 6:30 AM, and was completely normal, walking around and making coffee. He then had sudden onset of right leg weakness. He denies leg pain, denies back pain, denies neck pain.  Of note, he has had slurred speech that has been present for little bit over a month.  911 was called and he was brought to the emergency department as a code stroke.  He has mild right facial weakness and right leg weakness which is a disabling deficit and therefore IV TPA was offered. I discussed risks and benefits of IV TPA, including IF his slurred speech was a stroke that he may be at increased risk of bleeding. He expressed understanding and wished to proceed with IV TPA.   LKW: 6:30 am tpa given?: yes  ROS: A 14 point ROS was performed and is negative except as noted in the HPI.   Past Medical History:  Diagnosis Date  . Arthritis    s/p TKR  . Bladder cancer (HCC)    [facility], Ta low grade papillary urothileal carcinoma  . BPH (benign prostatic hyperplasia)   . Brugada syndrome    Possible Type II Brugada ECG pattern. No family history of SCD, no syncope, no tachypalpitations.  Marland Kitchen CAD (coronary artery disease)    a. LHC 1/15 - mid LCx 50 and 60%, proximal RCA 50%  . Chronic diastolic CHF (congestive heart failure) ([place])   . CKD (chronic kidney disease)   . COPD (chronic obstructive pulmonary disease) ([place])    Prior heavy smoker. PFTs (3/11): FVC 87%, FEV1 73%, ratio 0.57, DLCO 75%, TLC 121%. Moderate obstructive defect. PFTs (9/15): Only minimal obstruction, sugPrior heavy smoker. PFTs (3/11): FVC 87%, FEV1 73%, ratio 0.57, DLCO 75%, TLC 121%. Moderate obstructive defect. PFTs (9/15) minimal obstruction, poss asthma component   . Depression    with bipolar  tendencies  . DOE (dyspnea on exertion)    a. Myoview 8/09- EF 57%, no ischemia // b. Myoview (3/11) EF 68%, diaphragmatic attenuation, no ischemia //  c Echo (4/11) EF 48-54%, mild diastolic dysfunction, PASP 38 mmHg // d. PFTs 9/15 minimal obstruction  /  e. CT negative for ILD  . GERD (gastroesophageal reflux disease)   . History of Doppler ultrasound    Carotid US 3/11 negative for significant stenosis  //  carotid US 6/17: Mild bilateral plaque, 1-39% ICA  . History of echocardiogram    a. Echo 12/14 Inferior and distal septal HK, mild LVH, EF 50-55%, mild MR, mild LAE  //  b. Echo 6/17: Mild focal basal septal hypertrophy, EF 60-65%, normal wall motion, grade 1 diastolic dysfunction, mild AI  . HLD (hyperlipidemia)   . Hypertensive heart disease with CHF (congestive heart failure) ([place]) 04/08/2009  . Low testosterone   . Lung nodule    a. CT in 2015 >> PET in 12/15 not sugg of malignancy   . LVH (left ventricular hypertrophy)    a. Echo 12/14 Inferior and distal septal HK, mild LVH, EF 50-55%, mild MR, mild LAE  . Mild dementia   . Mitral regurgitation    Echo (4/11) with PISA ERO 0.3 cm^2 and regurgitant volume 41 mL (moderate MR). Echo (12/14) with mild MR.   . MS (multiple sclerosis) ([place])   .  MS (multiple sclerosis) ([place])   . Right bundle branch block   . Stroke ([place])   . Thoracic aortic aneurysm (HCC)    4.1 cm 2017  . Urinary retention with incomplete bladder emptying    receives botox injections and treatment for BPH through [facility]     Family History  Problem Relation Age of Onset  . Stroke Father   . Stroke Mother   . Hypertension Sister   . Kidney cancer Brother   . Arthritis Brother   . Other Brother     knee problems, Bil TKR  . Hypertension      family history     Social History:  reports that he quit smoking about 23 years ago. His smoking use included Cigarettes. He has a 80.00 pack-year smoking history. He has quit using smokeless tobacco. His  smokeless tobacco use included Chew. He reports that he does not drink alcohol or use drugs.   Exam: Current vital signs: BP 133/65 (BP Location: Right Arm)   Pulse 70   Temp 97.9 F (36.6 C) (Oral)   Resp 17   Wt 103 kg (227 lb 1.2 oz)   SpO2 100%   BMI 31.67 kg/m  Vital signs in last 24 hours: Temp:  [97.9 F (36.6 C)] 97.9 F (36.6 C) (04/01 0840) Pulse Rate:  [65-70] 70 (04/01 0850) Resp:  [17] 17 (04/01 0850) BP: (125-133)/(60-65) 133/65 (04/01 0850) SpO2:  [100 %] 100 % (04/01 0850) Weight:  [103 kg (227 lb 1.2 oz)] 103 kg (227 lb 1.2 oz) (04/01 0840)  Physical Exam  Constitutional: Appears well-developed and well-nourished.  Psych: Affect appropriate to situation Eyes: No scleral injection HENT: No OP obstrucion Head: Normocephalic.  Cardiovascular: Normal rate and regular rhythm.  Respiratory: Effort normal and breath sounds normal to anterior ascultation GI: Soft.  No distension. There is no tenderness.  Skin: WDI  Neuro: Mental Status: Patient is awake, alert, oriented to person, place, month, year, and situation. Patient is able to give a clear and coherent history. No signs of aphasia or neglect Cranial Nerves: II: Visual Fields are full. Pupils are equal, round, and reactive to light.   III,IV, VI: EOMI without ptosis or diploplia.  V: Facial sensation is symmetric to temperature VII: Facial movement with mild right facial weakness VIII: hearing is intact to voice X: Uvula elevates symmetrically XI: Shoulder shrug is symmetric. XII: tongue is midline without atrophy or fasciculations.  Motor: Tone is normal. Bulk is normal. 5/5 strength was present in bilateral arms without drift, he has diffuse weakness in the right lower extremity, with some preserved adduction, but absent knee extension/flexion, ankle extension/flexion, any ankle movements. Sensory: Sensation is symmetric to light touch and temperature in the arms and legs. Cerebellar: He has  normal finger-nose-finger bilaterally  I have reviewed labs in epic and the results pertinent to this consultation are: CMP-borderline creatinine  I have reviewed the images obtained: CT/CTA-no acute findings  Impression: 76 year old male with a history of previous stroke who presents with right leg weakness. There were no clear contraindications to IV TPA, not clear if his slurred speech was in fact stroke and if it was, it would've been a very small one given that his CT appears stable.  In this setting, I have offered IV TPA and patient wishes to proceed and therefore he has been administered this.  No signs of large vessel occlusion.  Recommendations: 1. HgbA1c, fasting lipid panel 2. MRI, MRA  of the brain without contrast  3. Frequent neuro checks 4. Echocardiogram 5. Carotid dopplers 6. Prophylactic therapy-none for 24 hours 7. Risk factor modification 8. Telemetry monitoring 9. PT consult, OT consult, Speech consult 10. please page stroke NP  Or  PA  Or MD  from 8am -4 pm   This patient is critically ill and at significant risk of neurological worsening, death and care requires constant monitoring of vital signs, hemodynamics,respiratory and cardiac monitoring, neurological assessment, discussion with family, other specialists and medical decision making of high complexity. I spent 60 minutes of neurocritical care time  in the care of  this patient.  Roland Rack, MD [facility] [phone]  If 7pm- 7am, please page neurology on call as listed in [place]. 04/04/2016  9:03 AM

## 2016-04-04 NOTE — ED Notes (Signed)
Pt arrives by [facility] from home. Per ems pt was making coffee at 0630 and had a sudden onset of weakness in his right leg and was unable to use his right leg. Pt arrives to ED alert and ox4. Unable to move his right leg, slurred speech.

## 2016-04-04 NOTE — ED Notes (Signed)
Per pt request wife called and made aware he would be going to [facility]

## 2016-04-04 NOTE — ED Notes (Signed)
Arrived back to ED room from CT

## 2016-04-04 NOTE — ED Provider Notes (Signed)
[facility] DEPT Provider Note   CSN: [id number] Arrival date & time: 04/04/16  6203   An emergency department physician performed an initial assessment on this suspected stroke patient at 0827.  History   Chief Complaint Chief Complaint  Patient presents with  . Code Stroke    HPI [name] [name] is a 76 y.o. male.  HPI Patient states he woke up at 6:30 this morning in his normal state of health. Around 7:15 am he had difficulty moving his right leg. EMS was called and brought in as a code stroke. EMS noted normal blood pressure and blood glucose en route. Had some mild right facial droop and slurred speech. Patient patient denies any pain. Specifically denies headache, neck pain, chest pain or abdominal pain. No nausea or vomiting. No visual changes. Denies sensory changes. No recent blood in stool or urine. Past Medical History:  Diagnosis Date  . Arthritis    s/p TKR  . Bladder cancer (HCC)    [facility] low grade papillary urothileal carcinoma  . BPH (benign prostatic hyperplasia)   . Brugada syndrome    Possible Type II Brugada ECG pattern. No family history of SCD, no syncope, no tachypalpitations.  Marland Kitchen CAD (coronary artery disease)    a. LHC 1/15 - mid LCx 50 and 60%, proximal RCA 50%  . Chronic diastolic CHF (congestive heart failure) ([place])   . CKD (chronic kidney disease)   . COPD (chronic obstructive pulmonary disease) ([place])    Prior heavy smoker. PFTs (3/11): FVC 87%, FEV1 73%, ratio 0.57, DLCO 75%, TLC 121%. Moderate obstructive defect. PFTs (9/15): Only minimal obstruction, sugPrior heavy smoker. PFTs (3/11): FVC 87%, FEV1 73%, ratio 0.57, DLCO 75%, TLC 121%. Moderate obstructive defect. PFTs (9/15) minimal obstruction, poss asthma component   . Depression    with bipolar tendencies  . DOE (dyspnea on exertion)    a. Myoview 8/09- EF 57%, no ischemia // b. Myoview (3/11) EF 68%, diaphragmatic attenuation, no ischemia //  c Echo (4/11) EF 55-97%, mild diastolic  dysfunction, PASP 38 mmHg // d. PFTs 9/15 minimal obstruction  /  e. CT negative for ILD  . GERD (gastroesophageal reflux disease)   . History of Doppler ultrasound    Carotid US 3/11 negative for significant stenosis  //  carotid US 6/17: Mild bilateral plaque, 1-39% ICA  . History of echocardiogram    a. Echo 12/14 Inferior and distal septal HK, mild LVH, EF 50-55%, mild MR, mild LAE  //  b. Echo 6/17: Mild focal basal septal hypertrophy, EF 60-65%, normal wall motion, grade 1 diastolic dysfunction, mild AI  . HLD (hyperlipidemia)   . Hypertensive heart disease with CHF (congestive heart failure) ([place]) 04/08/2009  . Low testosterone   . Lung nodule    a. CT in 2015 >> PET in 12/15 not sugg of malignancy   . LVH (left ventricular hypertrophy)    a. Echo 12/14 Inferior and distal septal HK, mild LVH, EF 50-55%, mild MR, mild LAE  . Mild dementia   . Mitral regurgitation    Echo (4/11) with PISA ERO 0.3 cm^2 and regurgitant volume 41 mL (moderate MR). Echo (12/14) with mild MR.   . MS (multiple sclerosis) ([place])   . MS (multiple sclerosis) ([place])   . Right bundle branch block   . Stroke ([place])   . Thoracic aortic aneurysm (HCC)    4.1 cm 2017  . Urinary retention with incomplete bladder emptying    receives botox injections and treatment  for BPH through [facility]    Patient Active Problem List   Diagnosis Date Noted  . Stroke (cerebrum) ([place]) 04/04/2016  . Syncope and collapse   . Dysarthria   . Acute cystitis without hematuria   . History of ischemic right MCA stroke   . Slurred speech 06/20/2015  . AKI (acute kidney injury) ([place]) 06/20/2015  . Thoracic aortic aneurysm ([place])   . Hyperlipidemia 06/05/2015  . Gastroesophageal reflux disease without esophagitis 06/05/2015  . COPD (chronic obstructive pulmonary disease) ([place]) 09/04/2013  . Lung nodule 09/04/2013  . Cancer ([place])   . CAD (coronary artery disease) 02/01/2013  . Chronic diastolic CHF (congestive heart failure) ([place])  01/03/2013  . Depression   . Urinary retention with incomplete bladder emptying   . BPH (benign prostatic hyperplasia)   . Brugada syndrome 08/23/2011  . Hypertensive heart disease with CHF (congestive heart failure) ([place]) 04/08/2009  . Mitral valve disorder 04/08/2009  . CAROTID BRUIT, RIGHT 03/06/2009  . SHORTNESS OF BREATH 03/06/2009  . OTHER CHEST PAIN 03/06/2009  . Multiple sclerosis ([place]) 10/11/2006    Past Surgical History:  Procedure Laterality Date  . APPENDECTOMY    . EP IMPLANTABLE DEVICE N/A 06/23/2015   Procedure: Loop Recorder Insertion;  Surgeon: Deboraha Sprang, MD;  Location: [facility] CV LAB;  Service: Cardiovascular;  Laterality: N/A;  . HEMORRHOID SURGERY    . LEFT HEART CATHETERIZATION WITH CORONARY ANGIOGRAM N/A 01/05/2013   Procedure: LEFT HEART CATHETERIZATION WITH CORONARY ANGIOGRAM;  Surgeon: Larey Dresser, MD;  Location: [facility] CATH LAB;  Service: Cardiovascular;  Laterality: N/A;  . ROTATOR CUFF REPAIR    . TEE WITHOUT CARDIOVERSION N/A 06/23/2015   Procedure: TRANSESOPHAGEAL ECHOCARDIOGRAM (TEE);  Surgeon: Fay Records, MD;  Location: [facility] ENDOSCOPY;  Service: Cardiovascular;  Laterality: N/A;  . TONSILLECTOMY    . TOTAL KNEE ARTHROPLASTY Right   . TRANSURETHRAL RESECTION OF PROSTATE         Home Medications    Prior to Admission medications   Medication Sig Start Date End Date Taking? Authorizing Provider  acetaminophen (TYLENOL) 325 MG tablet Take 650 mg by mouth every 6 (six) hours as needed.    Historical Provider, MD  amantadine (SYMMETREL) 100 MG capsule TAKE 1 CAPSULE BY MOUTH TWICE DAILY WITH BREAKFAST AND LUNCH 12/03/15   [name], MD  amLODipine (NORVASC) 10 MG tablet Take 1 tablet (10 mg total) by mouth daily. 01/20/16   [name], MD  atorvastatin (LIPITOR) 20 MG tablet TAKE 1 TABLET DAILY 06/23/15   Larey Dresser, MD  bisoprolol (ZEBETA) 10 MG tablet Take 1 tablet (10 mg total) by mouth daily. 12/01/15   [name], MD    cholecalciferol (VITAMIN D) 400 units TABS tablet Take 400 Units by mouth.    Historical Provider, MD  clopidogrel (PLAVIX) 75 MG tablet Take 1 tablet (75 mg total) by mouth daily. 07/18/15   Rosalin Hawking, MD  Cyanocobalamin (B-12) 1000 MCG CAPS Take 10,000 mcg by mouth daily.    Historical Provider, MD  doxazosin (CARDURA) 2 MG tablet Take 1 tablet (2 mg total) by mouth daily. 03/19/16   [name], MD  folic acid (FOLVITE) 1 MG tablet Take 1 mg by mouth daily.    Historical Provider, MD  furosemide (LASIX) 40 MG tablet Take 40 mg by mouth daily.    Historical Provider, MD  HYDROcodone-acetaminophen (NORCO) 10-325 MG tablet Take 1 tablet by mouth every 6 (six) hours as needed for moderate  pain or severe pain. 03/18/16   [name], MD  losartan (COZAAR) 50 MG tablet Take 100 mg by mouth daily.  09/16/15   Historical Provider, MD  MAGNESIUM CITRATE PO Take by mouth.    Historical Provider, MD  ondansetron (ZOFRAN) 4 MG tablet TAKE 1 TABLET(4 MG) BY MOUTH EVERY 8 HOURS AS NEEDED FOR NAUSEA OR VOMITING 03/25/16   [name], MD  pantoprazole (PROTONIX) 40 MG tablet Take 1 tablet (40 mg total) by mouth daily. 12/12/15   [name], MD  PROAIR HFA 108 [phone] Base) MCG/ACT inhaler USE 1 TO 2 INHALATIONS EVERY 6 HOURS AS NEEDED FOR WHEEZING OR SHORTNESS OF BREATH 10/17/15   [name], MD  rOPINIRole (REQUIP) 1 MG tablet TAKE 1 TABLET AT BEDTIME 11/17/15   [name], MD  SPIRIVA HANDIHALER 18 MCG inhalation capsule INHALE THE CONTENTS OF 1 CAPSULE WITH 2         INHALATIONS ONCE DAILY AS DIRECTED    [name], MD  UNABLE TO FIND Tumeric daily    Historical Provider, MD  venlafaxine XR (EFFEXOR-XR) 150 MG 24 hr capsule TAKE 1 CAPSULE TWICE A DAY 10/02/15   [name], MD    Family History Family History  Problem Relation Age of Onset  . Stroke Father   . Stroke Mother   . Hypertension Sister   . Kidney cancer Brother   . Arthritis Brother   . Other Brother      knee problems, Bil TKR  . Hypertension      family history    Social History Social History  Substance Use Topics  . Smoking status: Former Smoker    Packs/day: 2.00    Years: 40.00    Types: Cigarettes    Quit date: 01/04/1993  . Smokeless tobacco: Former Systems developer    Types: Chew     Comment: patient still smokes a pipe daily  . Alcohol use No     Comment: 6 pack/day - 06/20/15 Pt states wuit drinking in the 1970's     Allergies   Acetylcholine; Alcohol-sulfur [sulfur]; Amitriptyline; Bupivacaine; Clomipramine hcl; Cocaine; Desipramine; Ergonovine; Flecainide; Lithium; Loxapine; Nortriptyline; Oxcarbazepine; Procainamide; Procaine; Propafenone; Propofol; and Trifluoperazine   Review of Systems Review of Systems  Constitutional: Negative for chills and fever.  HENT: Negative for facial swelling.   Eyes: Negative for visual disturbance.  Respiratory: Negative for shortness of breath.   Cardiovascular: Negative for chest pain.  Gastrointestinal: Negative for abdominal pain, nausea and vomiting.  Genitourinary: Negative for dysuria, flank pain and frequency.  Musculoskeletal: Negative for back pain, myalgias and neck pain.  Skin: Negative for rash and wound.  Neurological: Positive for speech difficulty and weakness. Negative for dizziness, light-headedness, numbness and headaches.  All other systems reviewed and are negative.    Physical Exam Updated Vital Signs BP 133/65 (BP Location: Right Arm)   Pulse 70   Temp 97.9 F (36.6 C) (Oral)   Resp 17   Wt 227 lb 1.2 oz (103 kg)   SpO2 100%   BMI 31.67 kg/m   Physical Exam  Constitutional: He is oriented to person, place, and time. He appears well-developed and well-nourished. No distress.  HENT:  Head: Normocephalic and atraumatic.  Mouth/Throat: Oropharynx is clear and moist. No oropharyngeal exudate.  Slurred speech. Mild right-sided nasolabial fold flattening  Eyes: EOM are normal. Pupils are equal, round, and  reactive to light.  Neck: Normal range of motion. Neck supple.  No  meningismus.  Cardiovascular: Normal rate and regular rhythm.  Exam reveals no gallop and no friction rub.   No murmur heard. Pulmonary/Chest: Effort normal. No respiratory distress. He has wheezes (few scattered respiratory wheezes). He has no rales. He exhibits no tenderness.  No respiratory distress. Protecting airway.  Abdominal: Soft. Bowel sounds are normal. There is no tenderness. There is no rebound and no guarding.  Musculoskeletal: Normal range of motion. He exhibits no edema or tenderness.  No lower extremity swelling, asymmetry. Distal pulses intact.  Neurological: He is alert and oriented to person, place, and time.  Slurred speech. Equal bilateral grip strength. Right-sided lower facial weakness. 0/5 motor in right lower extremity. 5/5 motor and left lower extremity. Questionable mild proximal right upper extremity weakness. 5/5 motor and left upper extremity. Sensation to light touch intact.  Skin: Skin is warm and dry. Capillary refill takes less than 2 seconds. No rash noted. No erythema.  Psychiatric: He has a normal mood and affect. His behavior is normal.  Nursing note and vitals reviewed.    ED Treatments / Results  Labs (all labs ordered are listed, but only abnormal results are displayed) Labs Reviewed  CBG MONITORING, ED - Abnormal; Notable for the following:       Result Value   Glucose-Capillary 105 (*)    All other components within normal limits  I-STAT CHEM 8, ED - Abnormal; Notable for the following:    Chloride 99 (*)    BUN 21 (*)    Creatinine, Ser 1.30 (*)    Glucose, Bld 108 (*)    Calcium, Ion 1.06 (*)    All other components within normal limits  PROTIME-INR  APTT  CBC  DIFFERENTIAL  COMPREHENSIVE METABOLIC PANEL  I-STAT TROPOININ, ED    EKG  EKG Interpretation  Date/Time:  Sunday April 04 2016 08:58:00 EDT Ventricular Rate:  128 PR Interval:    QRS Duration: 120 QT  Interval:  356 QTC Calculation: 516 R Axis:   -64 Text Interpretation:  Multiform ventricular premature complexes Prolonged PR interval IVCD, consider atypical RBB  rct Baseline wander in lead(s) V2 V3 V4 V5 V6 Confirmed by Lita Mains  MD, Senon Nixon ([id number]) on 04/04/2016 9:12:42 AM       Radiology Ct Head Code Stroke W/o Cm  Result Date: 04/04/2016 CLINICAL DATA:  Code stroke.  Right leg flaccid.  Slurred speech. EXAM: CT HEAD WITHOUT CONTRAST TECHNIQUE: Contiguous axial images were obtained from the base of the skull through the vertex without intravenous contrast. COMPARISON:  CT head 06/20/2015 FINDINGS: Brain: Generalized atrophy. Chronic microvascular ischemic change. Chronic infarct right frontal lobe. Negative for acute infarct. Negative for acute hemorrhage or mass. No shift of the midline structures. Vascular: Negative for hyperdense vessel. Skull: Negative Sinuses/Orbits: Negative Other: None ASPECTS ([place] Stroke Program Early CT Score) - Ganglionic level infarction (caudate, lentiform nuclei, internal capsule, insula, M1-M3 cortex): 7 - Supraganglionic infarction (M4-M6 cortex): 3 Total score (0-10 with 10 being normal): 10 IMPRESSION: 1. No acute abnormality 2. ASPECTS is 10 These results were called by telephone at the time of interpretation on 04/04/2016 at 8:42 am to Dr. Leonel Ramsay, who verbally acknowledged these results. Electronically Signed   By: Franchot Gallo M.D.   On: 04/04/2016 08:44    Procedures Procedures (including critical care time)  Medications Ordered in ED Medications  alteplase (ACTIVASE) 1 mg/mL infusion 90 mg (81 mg Intravenous New Bag/Given 04/04/16 0841)  iopamidol (ISOVUE-370) 76 % injection (50 mLs  Contrast Given 04/04/16 0846)  CRITICAL CARE Performed by: Lita Mains, Broden Holt Total critical care time: 25 minutes Critical care time was exclusive of separately billable procedures and treating other patients. Critical care was necessary to treat or prevent imminent  or life-threatening deterioration. Critical care was time spent personally by me on the following activities: development of treatment plan with patient and/or surrogate as well as nursing, discussions with consultants, evaluation of patient's response to treatment, examination of patient, obtaining history from patient or surrogate, ordering and performing treatments and interventions, ordering and review of laboratory studies, ordering and review of radiographic studies, pulse oximetry and re-evaluation of patient's condition.  Initial Impression / Assessment and Plan / ED Course  I have reviewed the triage vital signs and the nursing notes.  Pertinent labs & imaging results that were available during my care of the patient were reviewed by me and considered in my medical decision making (see chart for details).     In conduction with neurologist decision made to give TPA. Will be admitted to the neuro ICU by neurologist.  Final Clinical Impressions(s) / ED Diagnoses   Final diagnoses:  Stroke (cerebrum) [facility])    New Prescriptions New Prescriptions   No medications on file     Julianne Rice, MD 04/04/16 [phone]

## 2016-04-04 NOTE — ED Notes (Signed)
EKG given to Dr. Yelverton 

## 2016-04-04 NOTE — Progress Notes (Signed)
PT Cancellation Note  Patient Details Name: [name] [name] MRN: [id number] DOB: December 18, 1940   Cancelled Treatment:    Reason Eval/Treat Not Completed: Patient not medically ready. Pt with TPA and on bedrest. Will continue to follow for medical readiness.   [name] 04/04/2016, 12:39 PM [employer] PT [phone]

## 2016-04-04 NOTE — ED Notes (Signed)
Report called to 65M RN, pt being transported by this RN at this time.

## 2016-04-05 ENCOUNTER — Telehealth: Payer: Self-pay | Admitting: Family Medicine

## 2016-04-05 ENCOUNTER — Inpatient Hospital Stay (HOSPITAL_COMMUNITY): Payer: Medicare Other

## 2016-04-05 DIAGNOSIS — I6310 Cerebral infarction due to embolism of unspecified precerebral artery: Secondary | ICD-10-CM

## 2016-04-05 DIAGNOSIS — I2510 Atherosclerotic heart disease of native coronary artery without angina pectoris: Secondary | ICD-10-CM

## 2016-04-05 DIAGNOSIS — I639 Cerebral infarction, unspecified: Secondary | ICD-10-CM

## 2016-04-05 DIAGNOSIS — J438 Other emphysema: Secondary | ICD-10-CM

## 2016-04-05 DIAGNOSIS — R0602 Shortness of breath: Secondary | ICD-10-CM

## 2016-04-05 LAB — CUP PACEART REMOTE DEVICE CHECK
MDC IDC PG IMPLANT DT: 20170619
MDC IDC SESS DTM: 20180321183555

## 2016-04-05 LAB — LIPID PANEL
Cholesterol: 134 mg/dL (ref 0–200)
HDL: 43 mg/dL (ref >40)
LDL CALC: 61 mg/dL (ref 0–99)
TRIGLYCERIDES: 150 mg/dL — AB (ref <150)
Total CHOL/HDL Ratio: 3.1 RATIO
VLDL: 30 mg/dL (ref 0–40)

## 2016-04-05 MED ORDER — AMLODIPINE BESYLATE 10 MG PO TABS
10.0000 mg | ORAL_TABLET | Freq: Every day | ORAL | Status: DC
  Administered 2016-04-05 – 2016-04-06 (×2): 10 mg via ORAL
  Filled 2016-04-05 (×2): qty 1

## 2016-04-05 MED ORDER — ACETAMINOPHEN 325 MG PO TABS
650.0000 mg | ORAL_TABLET | Freq: Every day | ORAL | Status: DC
  Administered 2016-04-05: 650 mg via ORAL
  Filled 2016-04-05: qty 2

## 2016-04-05 MED ORDER — LOSARTAN POTASSIUM 50 MG PO TABS
100.0000 mg | ORAL_TABLET | Freq: Every day | ORAL | Status: DC
  Administered 2016-04-05 – 2016-04-06 (×2): 100 mg via ORAL
  Filled 2016-04-05 (×2): qty 2

## 2016-04-05 MED ORDER — ASPIRIN-DIPYRIDAMOLE ER 25-200 MG PO CP12
1.0000 | ORAL_CAPSULE | Freq: Every day | ORAL | Status: DC
  Administered 2016-04-05: 1 via ORAL
  Filled 2016-04-05 (×2): qty 1

## 2016-04-05 MED ORDER — ASPIRIN-DIPYRIDAMOLE ER 25-200 MG PO CP12: 1.0000 | ORAL_CAPSULE | Freq: Two times a day (BID) | ORAL | Status: DC

## 2016-04-05 MED ORDER — QUETIAPINE FUMARATE 25 MG PO TABS
25.0000 mg | ORAL_TABLET | Freq: Every day | ORAL | Status: DC
  Administered 2016-04-05: 25 mg via ORAL
  Filled 2016-04-05: qty 1

## 2016-04-05 NOTE — Evaluation (Signed)
Occupational Therapy Evaluation Patient Details Name: [name] [name] MRN: [id number] DOB: 14-Mar-1940 Today's Date: 04/05/2016    History of Present Illness 76 yo male s/p Subcentimeter acute RIGHT cerebellar and RIGHT occipital lobe infarcts remote R frontal lobe infarct and small cerebellar infarcts   Clinical Impression   PT admitted with R cerebellar and R occipital lobe infaracts. Pt currently with functional limitiations due to the deficits listed below (see OT problem list). PTA was independent with adls.  Pt will benefit from skilled OT to increase their independence and safety with adls and balance to allow discharge home without follow.     Follow Up Recommendations  No OT follow up    Equipment Recommendations  None recommended by OT    Recommendations for Other Services       Precautions / Restrictions Precautions Precautions: Fall      Mobility Bed Mobility Overal bed mobility: Modified Independent                Transfers Overall transfer level: Needs assistance   Transfers: Sit to/from Stand Sit to Stand: Min guard              Balance Overall balance assessment: Needs assistance Sitting-balance support: Bilateral upper extremity supported;Feet supported Sitting balance-Leahy Scale: Good     Standing balance support: No upper extremity supported;During functional activity Standing balance-Leahy Scale: Fair Standing balance comment: reports dizziness with static standing                           ADL either performed or assessed with clinical judgement   ADL Overall ADL's : Needs assistance/impaired     Grooming: Wash/dry hands;Wash/dry face;Oral care;Brushing hair;Modified independent;Sitting   Upper Body Bathing: Supervision/ safety;Sitting               Toilet Transfer: Min guard           Functional mobility during ADLs: Min guard General ADL Comments: Pt with wife present and using a sound machine to  help patient hear therapist due to hearing aids not working     Museum/gallery curator      Pertinent Vitals/Pain Pain Assessment: No/denies pain     Hand Dominance Right   Extremity/Trunk Assessment Upper Extremity Assessment Upper Extremity Assessment: Overall WFL for tasks assessed;RUE deficits/detail RUE Deficits / Details: ROM 100 degrees s/p x2 shoulder surg and this is baseline   Lower Extremity Assessment Lower Extremity Assessment: Defer to PT evaluation   Cervical / Trunk Assessment Cervical / Trunk Assessment: Normal   Communication Communication Communication: HOH;Other (comment) (sound ampifer )   Cognition Arousal/Alertness: Awake/alert Behavior During Therapy: WFL for tasks assessed/performed Overall Cognitive Status: Within Functional Limits for tasks assessed                                     General Comments       Exercises     Shoulder Instructions      Home Living Family/patient expects to be discharged to:: Private residence Living Arrangements: Spouse/significant other Available Help at Discharge: Family;Available 24 hours/day Type of Home: House Home Access: Level entry     Home Layout: One level     Bathroom Shower/Tub: Occupational psychologist: Standard     Home Equipment: Grab bars -  tub/shower;Crutches;Walker - 2 wheels;Shower seat - built in          Prior Functioning/Environment Level of Independence: Independent                 OT Problem List: Decreased strength;Decreased activity tolerance;Impaired balance (sitting and/or standing);Decreased safety awareness;Decreased knowledge of use of DME or AE;Decreased knowledge of precautions      OT Treatment/Interventions: Self-care/ADL training;Therapeutic exercise;DME and/or AE instruction;Energy conservation;Therapeutic activities;Patient/family education;Balance training    OT Goals(Current goals can be found in the care  plan section) Acute Rehab OT Goals Patient Stated Goal: to get home today OT Goal Formulation: With patient Time For Goal Achievement: 04/19/16 Potential to Achieve Goals: Good  OT Frequency: Min 2X/week   Barriers to D/C:            Co-evaluation              End of Session Equipment Utilized During Treatment: Gait belt Nurse Communication: Mobility status;Precautions  Activity Tolerance: Patient tolerated treatment well Patient left: in chair;with call bell/phone within reach;with family/visitor present;with chair alarm set  OT Visit Diagnosis: Unsteadiness on feet (R26.81)                Time: 5909-3112 OT Time Calculation (min): 33 min Charges:  OT General Charges $OT Visit: 1 Procedure OT Evaluation $OT Eval Moderate Complexity: 1 Procedure OT Treatments $Self Care/Home Management : 8-22 mins G-Codes:      Jeri Modena   OTR/L Pager: [phone] Office: [phone] .   Parke Poisson B 04/05/2016, 10:16 AM

## 2016-04-05 NOTE — Telephone Encounter (Signed)
[name] [name] calling to let you know the he had a stroke but is doing better today would like for you to know that  [phone]

## 2016-04-05 NOTE — Progress Notes (Signed)
Patient arrived to the unit via wheelchair from [place] at 1852.  Welcomed to unit. Oriented to unit policies and procedures. Patient aware of use of bed alarm. Given telephone and call light. Patient agreeable to call RN when needing to get out of bed. Wendee Copp

## 2016-04-05 NOTE — Consult Note (Addendum)
CARDIOLOGY CONSULT NOTE   Patient ID: [name] [name] MRN: [id number] DOB/AGE: [birth year] 76 y.o.  Admit date: 04/04/2016  Requesting Physician: Dr. Leonie Man Primary Physician:   [name], MD Primary Cardiologist: Dr. Kirk Ruths McLean>> Dr. Harrell Gave End  Reason for Consultation:  SOB  HPI [name] [name] is a 76 y.o. male who is being seen today for the evaluation of CHF at the request of Dr. Leonie Man.   [name] [name] is a 76 y.o. male with a history of mod non-obstructive CAD on LHC in 6/22, diastolic CHF, HTN, HLD, thoracic aortic aneurysm, possible MS, GERD, COPD, prior stroke, AKI assoc with NSAID use, tobacco abuse and bipolar depression who presented to [facility] on 04/04/16 with weakness. Code stroke was called and he was given TPA. Cardiology asked to help with management of dyspnea.   Admitted 6/17 with a R MCA distribution stroke. TEE demonstrated no PFO, no LAA clot and normal thoracic aorta. He had a Linq ILR implanted by Dr. [name].   He has been followed by Dr. Chase Caller for at least moderate COPD and ongoing exertional dyspnea. Dr Chase Caller suspected dyspnea/fatigue was due to deconditioning and he was was referred to pulmonary rehab. He continued to smoke cigars while doing this.   He was seen by Richardson Dopp PA-C on 03/30/16 for follow up. He complained of continued dyspnea. He was found to have mild volume overload (BNP mildly elevated ~300) and lasix increased to 40mg  BID x 3days then back to 40mg  daily. Weight was 228 lbs at that time.   He was in his usual state of health until yesterday AM around 7:30am when he had sudden onset of right leg weakness. EMS was called and brought in as a code stroke. EMS noted normal blood pressure and blood glucose en route. Had some mild right facial droop and slurred speech. Dr. Leonel Ramsay with neurology administered TPA. CTA was negative but MRI showed an acute right cerebellar and right occipital lobe infarcts. Patient is  now feeling completely back to normal in terms of weakness and speech. Per patient, his LE edema resolved with increased lasix but his dyspnea has not improved at all. He is extremely SOB with just walking a few feet or getting himself dressed. This has been ongoing and unchanged. No LE edema, orthopnea or PND. No dizziness or syncope. No chest pain. No blood in stool or urine. No palpitations. His biggest complaint is being limited by dyspnea. Otherwise, he is "ready to get out of here."     Past Medical History:  Diagnosis Date  . Arthritis    s/p TKR  . Bladder cancer (HCC)    [facility], [facility] low grade papillary urothileal carcinoma  . BPH (benign prostatic hyperplasia)   . Brugada syndrome    Possible Type II Brugada ECG pattern. No family history of SCD, no syncope, no tachypalpitations.  Marland Kitchen CAD (coronary artery disease)    a. LHC 1/15 - mid LCx 50 and 60%, proximal RCA 50%  . Chronic diastolic CHF (congestive heart failure) ([place])   . CKD (chronic kidney disease)   . COPD (chronic obstructive pulmonary disease) ([place])    Prior heavy smoker. PFTs (3/11): FVC 87%, FEV1 73%, ratio 0.57, DLCO 75%, TLC 121%. Moderate obstructive defect. PFTs (9/15): Only minimal obstruction, sugPrior heavy smoker. PFTs (3/11): FVC 87%, FEV1 73%, ratio 0.57, DLCO 75%, TLC 121%. Moderate obstructive defect. PFTs (9/15) minimal obstruction, poss asthma component   . Depression    with bipolar  tendencies  . DOE (dyspnea on exertion)    a. Myoview 8/09- EF 57%, no ischemia // b. Myoview (3/11) EF 68%, diaphragmatic attenuation, no ischemia //  c Echo (4/11) EF 24-82%, mild diastolic dysfunction, PASP 38 mmHg // d. PFTs 9/15 minimal obstruction  /  e. CT negative for ILD  . GERD (gastroesophageal reflux disease)   . History of Doppler ultrasound    Carotid US 3/11 negative for significant stenosis  //  carotid US 6/17: Mild bilateral plaque, 1-39% ICA  . History of echocardiogram    a. Echo 12/14 Inferior and distal  septal HK, mild LVH, EF 50-55%, mild MR, mild LAE  //  b. Echo 6/17: Mild focal basal septal hypertrophy, EF 60-65%, normal wall motion, grade 1 diastolic dysfunction, mild AI  . HLD (hyperlipidemia)   . Hypertensive heart disease with CHF (congestive heart failure) ([place]) 04/08/2009  . Low testosterone   . Lung nodule    a. CT in 2015 >> PET in 12/15 not sugg of malignancy   . LVH (left ventricular hypertrophy)    a. Echo 12/14 Inferior and distal septal HK, mild LVH, EF 50-55%, mild MR, mild LAE  . Mild dementia   . Mitral regurgitation    Echo (4/11) with PISA ERO 0.3 cm^2 and regurgitant volume 41 mL (moderate MR). Echo (12/14) with mild MR.   . MS (multiple sclerosis) ([place])   . MS (multiple sclerosis) ([place])   . Right bundle branch block   . Stroke ([name])   . Thoracic aortic aneurysm (HCC)    4.1 cm 2017  . Urinary retention with incomplete bladder emptying    receives botox injections and treatment for BPH through [facility]     Past Surgical History:  Procedure Laterality Date  . APPENDECTOMY    . EP IMPLANTABLE DEVICE N/A 06/23/2015   Procedure: Loop Recorder Insertion;  Surgeon: Deboraha Sprang, MD;  Location: [place] CV LAB;  Service: Cardiovascular;  Laterality: N/A;  . HEMORRHOID SURGERY    . LEFT HEART CATHETERIZATION WITH CORONARY ANGIOGRAM N/A 01/05/2013   Procedure: LEFT HEART CATHETERIZATION WITH CORONARY ANGIOGRAM;  Surgeon: Larey Dresser, MD;  Location: [facility] CATH LAB;  Service: Cardiovascular;  Laterality: N/A;  . ROTATOR CUFF REPAIR    . TEE WITHOUT CARDIOVERSION N/A 06/23/2015   Procedure: TRANSESOPHAGEAL ECHOCARDIOGRAM (TEE);  Surgeon: Fay Records, MD;  Location: [facility] ENDOSCOPY;  Service: Cardiovascular;  Laterality: N/A;  . TONSILLECTOMY    . TOTAL KNEE ARTHROPLASTY Right   . TRANSURETHRAL RESECTION OF PROSTATE      Allergies  Allergen Reactions  . Acetylcholine     Patient was suspected of having "Brugada Syndrome": (BrS) is a genetic condition that results in  abnormal electrical activity within the heart, increasing the risk of sudden cardiac death. Those affected may have episodes of passing out.  Marland Kitchen Alcohol-Sulfur [Sulfur] Other (See Comments)    Patient was suspected of having "Brugada Syndrome"  . Amitriptyline Other (See Comments)    Patient was suspected of having "Brugada Syndrome"  . Bupivacaine Other (See Comments)    Patient was suspected of having "Brugada Syndrome"  . Clomipramine Hcl Other (See Comments)    Patient was suspected of having "Brugada Syndrome"  . Cocaine Other (See Comments)    Patient was suspected of having "Brugada Syndrome"  . Desipramine Other (See Comments)    Patient was suspected of having "Brugada Syndrome"  . Ergonovine Other (See Comments)    Patient was suspected of having "  Brugada Syndrome"  . Flecainide Other (See Comments)    Patient was suspected of having "Brugada Syndrome"  . Lithium Other (See Comments)    Patient was suspected of having "Brugada Syndrome"  . Loxapine Other (See Comments)    Patient was suspected of having "Brugada Syndrome"  . Nortriptyline Other (See Comments)    Patient was suspected of having "Brugada Syndrome"  . Oxcarbazepine Other (See Comments)    Patient was suspected of having "Brugada Syndrome"  . Procainamide Other (See Comments)    Patient was suspected of having "Brugada Syndrome"  . Procaine Other (See Comments)    Patient was suspected of having "Brugada Syndrome"  . Propafenone Other (See Comments)    Patient was suspected of having "Brugada Syndrome"  . Propofol Other (See Comments)    Patient was suspected of having "Brugada Syndrome"  . Trifluoperazine Other (See Comments)    Patient was suspected of having "Brugada Syndrome"    I have reviewed the patient's current medications . amantadine  100 mg Oral BID  . atorvastatin  20 mg Oral q1800  . doxazosin  2 mg Oral Daily  . furosemide  40 mg Oral Daily  . pantoprazole (PROTONIX) IV  40 mg Intravenous  QHS  . rOPINIRole  1 mg Oral QHS  . tiotropium  18 mcg Inhalation Daily  . venlafaxine XR  150 mg Oral BID    acetaminophen **OR** acetaminophen (TYLENOL) oral liquid 160 mg/5 mL **OR** acetaminophen, albuterol  Prior to Admission medications   Medication Sig Start Date End Date Taking? Authorizing Provider  acetaminophen (TYLENOL) 325 MG tablet Take 650 mg by mouth every 6 (six) hours as needed (for pain or headaches).    Yes Historical Provider, MD  amantadine (SYMMETREL) 100 MG capsule TAKE 1 CAPSULE BY MOUTH TWICE DAILY WITH BREAKFAST AND LUNCH 12/03/15  Yes [name], MD  amLODipine (NORVASC) 10 MG tablet Take 1 tablet (10 mg total) by mouth daily. 01/20/16  Yes [name], MD  atorvastatin (LIPITOR) 20 MG tablet TAKE 1 TABLET DAILY 06/23/15  Yes Larey Dresser, MD  cholecalciferol (VITAMIN D) 400 units TABS tablet Take 400 Units by mouth daily.    Yes Historical Provider, MD  clopidogrel (PLAVIX) 75 MG tablet Take 1 tablet (75 mg total) by mouth daily. 07/18/15  Yes Rosalin Hawking, MD  Cyanocobalamin (B-12) 1000 MCG CAPS Take 1,000 mcg by mouth daily.    Yes Historical Provider, MD  doxazosin (CARDURA) 2 MG tablet Take 1 tablet (2 mg total) by mouth daily. 03/19/16  Yes [name], MD  folic acid (FOLVITE) 1 MG tablet Take 1 mg by mouth daily.   Yes Historical Provider, MD  furosemide (LASIX) 40 MG tablet Take 40 mg by mouth daily.   Yes Historical Provider, MD  HYDROcodone-acetaminophen (NORCO) 10-325 MG tablet Take 1 tablet by mouth every 6 (six) hours as needed for moderate pain or severe pain. 03/18/16  Yes [name], MD  losartan (COZAAR) 50 MG tablet Take 100 mg by mouth daily.  09/16/15  Yes Historical Provider, MD  MAGNESIUM CITRATE PO Take 0.5-1 Bottles by mouth once as needed (for mild constipation).    Yes Historical Provider, MD  ondansetron (ZOFRAN) 4 MG tablet TAKE 1 TABLET(4 MG) BY MOUTH EVERY 8 HOURS AS NEEDED FOR NAUSEA OR VOMITING 03/25/16  Yes [name], MD  pantoprazole (PROTONIX) 40 MG tablet Take 1 tablet (40 mg total) by mouth daily. 12/12/15  Yes Cletus Gash  Avel Peace, MD  PROAIR HFA 108 (90 Base) MCG/ACT inhaler USE 1 TO 2 INHALATIONS EVERY 6 HOURS AS NEEDED FOR WHEEZING OR SHORTNESS OF BREATH 10/17/15  Yes [name], MD  rOPINIRole (REQUIP) 1 MG tablet TAKE 1 TABLET AT BEDTIME 11/17/15  Yes [name], MD  SPIRIVA HANDIHALER 18 MCG inhalation capsule INHALE THE CONTENTS OF 1 CAPSULE WITH 2         INHALATIONS ONCE DAILY AS DIRECTED   Yes [name], MD  venlafaxine XR (EFFEXOR-XR) 150 MG 24 hr capsule TAKE 1 CAPSULE TWICE A DAY 10/02/15  Yes [name], MD  bisoprolol (ZEBETA) 10 MG tablet Take 1 tablet (10 mg total) by mouth daily. 12/01/15   [name], MD     Social History   Social History  . Marital status: Married    Spouse name: N/A  . Number of children: N/A  . Years of education: N/A   Occupational History  . retired Retired   Social History Main Topics  . Smoking status: Former Smoker    Packs/day: 2.00    Years: 40.00    Types: Cigarettes    Quit date: 01/04/1993  . Smokeless tobacco: Former Systems developer    Types: Chew     Comment: patient still smokes a pipe daily  . Alcohol use 15.0 oz/week    25 Cans of beer per week     Comment: 6 pack/day - 06/20/15 Pt states wuit drinking in the 1970's  . Drug use: No  . Sexual activity: Not on file   Other Topics Concern  . Not on file   Social History Narrative   Retired from the [employer] after 20 years   [place] Veteran    Family Status  Relation Status  . Father Deceased  . Mother Deceased  . Sister   . Brother   . Brother   . Brother   .     Family History  Problem Relation Age of Onset  . Stroke Father   . Stroke Mother   . Hypertension Sister   . Kidney cancer Brother   . Arthritis Brother   . Other Brother     knee problems, Bil TKR  . Hypertension      family history    ROS:  Full 14 point review of systems  complete and found to be negative unless listed above.  Physical Exam: Blood pressure (!) 148/66, pulse 73, temperature 98.6 F (37 C), temperature source Oral, resp. rate 19, height 6' (1.829 m), weight 221 lb 9 oz (100.5 kg), SpO2 99 %.  General: Well developed, well nourished, male in no acute distress Head: Eyes PERRLA, No xanthomas.   Normocephalic and atraumatic, oropharynx without edema or exudate.  Lungs: When walking to bed, increased resp effort. Decreased air movement bilaterally. No Wheeze Heart: HRRR S1 S2, no rub/gallop, Heart regular rate and rhythm with S1, S2  No murmur. pulses are 2+ extrem.   Neck: No carotid bruits. No lymphadenopathy. No JVD. Abdomen: Bowel sounds present, abdomen soft and non-tender without masses or hernias noted. Msk:  No spine or cva tenderness. No weakness, no joint deformities or effusions. Extremities: No clubbing or cyanosis.  No LE edema.  Neuro: Alert and oriented X 3. No focal deficits noted. Psych:  Good affect, responds appropriately Skin: No rashes or lesions noted. Foley in place  Labs:   Lab Results  Component Value Date   WBC 7.5 04/04/2016   HGB  14.6 04/04/2016   HCT 43.0 04/04/2016   MCV 99.3 04/04/2016   PLT 306 04/04/2016    Recent Labs  04/04/16 0830  INR 0.97    Recent Labs Lab 04/04/16 0830 04/04/16 0832  NA 136 138  K 4.5 4.4  CL 101 99*  CO2 26  --   BUN 17 21*  CREATININE 1.34* 1.30*  CALCIUM 8.8*  --   PROT 7.0  --   BILITOT 0.6  --   ALKPHOS 74  --   ALT 15*  --   AST 23  --   GLUCOSE 112* 108*  ALBUMIN 4.1  --    No results found for: MG No results for input(s): CKTOTAL, CKMB, TROPONINI in the last 72 hours.  Recent Labs  04/04/16 0830  TROPIPOC 0.00   Pro B Natriuretic peptide (BNP)  Date/Time Value Ref Range Status  06/04/2013 10:58 AM 61.0 0.0 - 100.0 pg/mL Final  01/16/2013 11:38 AM 15.0 0.0 - 100.0 pg/mL Final   NT-Pro BNP  Date/Time Value Ref Range Status  03/30/2016 10:47 AM  386 (H) 0 - 376 pg/mL Final    Comment:    The following cut-points have been suggested for the use of proBNP for the diagnostic evaluation of heart failure (HF) in patients with acute dyspnea: Modality                     Age           Optimal Cut                            (years)            Point ------------------------------------------------------ Diagnosis (rule in HF)        <50            450 pg/mL                           50 - 75            900 pg/mL                               >75           1800 pg/mL Exclusion (rule out HF)  Age independent     300 pg/mL    Lab Results  Component Value Date   CHOL 134 04/05/2016   HDL 43 04/05/2016   LDLCALC 61 04/05/2016   TRIG 150 (H) 04/05/2016   Echo 06/16/15 Mild focal basal septal hypertrophy, EF 60-65%, normal wall motion, grade 1 diastolic dysfunction, mild AI  Carotid US 06/16/15 Summary:Bilateral: mild soft plaque CCA and origin ICA. 1-39% ICA plaquing.  Chest CT 6/17 IMPRESSION: 1. The nodules are stable from 06/26/2013. More spiculated nodules inferomedially in the right middle lobe were not previously positive on PET scan and accordingly are likely benign. The 5 by 6 mm nodule is also stable over the past 2 years. This 2 year stability is compatible with benign process, and based on current guidelines, further follow up surveillance is not required. 2. 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 Recommend annual imaging followup by CTA or MRA. This recommendation follows 2010 ACCF/AHA/AATS/ACR/ASA/SCA/SCAI/SIR/STS/SVM Guidelines for the Diagnosis and Management of Patients with Thoracic Aortic Disease. Circulation. 2010; 121: R443-X540 3. Coronary, aortic arch, and branch vessel  atherosclerotic vascular disease.  LHC 1/15 LM No significant disease.  LAD: Luminal irregularities.  LCx: serial 50% and 60% stenoses in the mid LCx.  RCA: 50% proximal RCA stenosis.  LVEF is estimated at 55% without  significant wall motion abnormalities noted in RAO projection.  Final Conclusions: There is moderate coronary disease involving the mid LCx and the proximal RCA. There are no severe stenoses. I do not think that coronary disease is causing the patient's exertional dyspnea. His LVEDP also is not elevated on the higher Lasix dose. I suspect that at this point COPD is the major cause of his dyspnea.    ECG:  NSR, RBBB, baseline artifact- personally reviewed   TELE: NSR - personally reviewed   Radiology:  Ct Angio Head W Or Wo Contrast  Result Date: 04/04/2016 CLINICAL DATA:  Stroke.  Right leg weakness EXAM: CT ANGIOGRAPHY HEAD AND NECK TECHNIQUE: Multidetector CT imaging of the head and neck was performed using the standard protocol during bolus administration of intravenous contrast. Multiplanar CT image reconstructions and MIPs were obtained to evaluate the vascular anatomy. Carotid stenosis measurements (when applicable) are obtained utilizing NASCET criteria, using the distal internal carotid diameter as the denominator. CONTRAST:  50 mL Isovue 370 IV COMPARISON:  CT head 04/04/2016 FINDINGS: CTA NECK FINDINGS Aortic arch: Mild atherosclerotic disease in the aortic arch without dissection or aneurysm. Proximal great vessels widely patent. Right carotid system: Right common carotid artery widely patent. Mild atherosclerotic disease right carotid bifurcation without stenosis. Left carotid system: Left common carotid artery widely patent. Left carotid bifurcation normal without significant stenosis or atherosclerotic disease. Vertebral arteries: Severe stenosis at the origin of the left vertebral artery. This vessel is then patent to the basilar without additional stenosis. Mild stenosis at the origin of the right vertebral artery which is then patent to the basilar without additional stenosis. Skeleton: Moderate degenerative changes throughout the cervical spine. No acute skeletal abnormality. Other  neck: Negative for mass or adenopathy Upper chest: Lung apices clear Review of the MIP images confirms the above findings CTA HEAD FINDINGS Anterior circulation: Mild atherosclerotic disease in the cavernous carotid bilaterally without significant stenosis. Anterior and middle cerebral arteries patent without significant stenosis or large vessel occlusion. Distal anterior cerebral arteries are patent bilaterally. Posterior circulation: Both vertebral arteries patent to the basilar. Basilar widely patent. PICA, superior cerebellar, and posterior cerebral arteries patent bilaterally without stenosis. Venous sinuses: Patent Anatomic variants: None Delayed phase: Not performed Review of the MIP images confirms the above findings IMPRESSION: No significant carotid artery stenosis. Anterior and middle cerebral arteries patent without significant stenosis or occlusion Severe stenosis origin of the left vertebral artery and mild stenosis origin of the right vertebral artery. Posterior intracranial circulation is patent. These results were reviewed in person at the time of interpretation on 04/04/2016 at 9:15 am to Dr. Roland Rack , who verbally acknowledged these results. Electronically Signed   By: Franchot Gallo M.D.   On: 04/04/2016 09:24   Ct Angio Neck W Or Wo Contrast  Result Date: 04/04/2016 CLINICAL DATA:  Stroke.  Right leg weakness EXAM: CT ANGIOGRAPHY HEAD AND NECK TECHNIQUE: Multidetector CT imaging of the head and neck was performed using the standard protocol during bolus administration of intravenous contrast. Multiplanar CT image reconstructions and MIPs were obtained to evaluate the vascular anatomy. Carotid stenosis measurements (when applicable) are obtained utilizing NASCET criteria, using the distal internal carotid diameter as the denominator. CONTRAST:  50 mL Isovue 370 IV COMPARISON:  CT  head 04/04/2016 FINDINGS: CTA NECK FINDINGS Aortic arch: Mild atherosclerotic disease in the aortic arch  without dissection or aneurysm. Proximal great vessels widely patent. Right carotid system: Right common carotid artery widely patent. Mild atherosclerotic disease right carotid bifurcation without stenosis. Left carotid system: Left common carotid artery widely patent. Left carotid bifurcation normal without significant stenosis or atherosclerotic disease. Vertebral arteries: Severe stenosis at the origin of the left vertebral artery. This vessel is then patent to the basilar without additional stenosis. Mild stenosis at the origin of the right vertebral artery which is then patent to the basilar without additional stenosis. Skeleton: Moderate degenerative changes throughout the cervical spine. No acute skeletal abnormality. Other neck: Negative for mass or adenopathy Upper chest: Lung apices clear Review of the MIP images confirms the above findings CTA HEAD FINDINGS Anterior circulation: Mild atherosclerotic disease in the cavernous carotid bilaterally without significant stenosis. Anterior and middle cerebral arteries patent without significant stenosis or large vessel occlusion. Distal anterior cerebral arteries are patent bilaterally. Posterior circulation: Both vertebral arteries patent to the basilar. Basilar widely patent. PICA, superior cerebellar, and posterior cerebral arteries patent bilaterally without stenosis. Venous sinuses: Patent Anatomic variants: None Delayed phase: Not performed Review of the MIP images confirms the above findings IMPRESSION: No significant carotid artery stenosis. Anterior and middle cerebral arteries patent without significant stenosis or occlusion Severe stenosis origin of the left vertebral artery and mild stenosis origin of the right vertebral artery. Posterior intracranial circulation is patent. These results were reviewed in person at the time of interpretation on 04/04/2016 at 9:15 am to Dr. Roland Rack , who verbally acknowledged these results. Electronically  Signed   By: Franchot Gallo M.D.   On: 04/04/2016 09:24   Mr Brain Wo Contrast  Result Date: 04/05/2016 CLINICAL DATA:  Acute onset RIGHT leg weakness April 1st, status post tPA. History of multiple sclerosis, mild dementia, hypertension, stroke. EXAM: MRI HEAD WITHOUT CONTRAST TECHNIQUE: Multiplanar, multiecho pulse sequences of the brain and surrounding structures were obtained without intravenous contrast. COMPARISON:  CT HEAD April 04, 2016 can't MRI head June 20, 2015 FINDINGS: BRAIN: 7 mm ovoid RIGHT occipital lobe and 5 mm ovoid RIGHT mesial occipital lobe foci of reduced diffusion with low ADC values. Faint susceptibility artifact RIGHT frontal lobe and associated with prior infarct. Patchy RIGHT frontal lobe encephalomalacia. Old bilateral small cerebellar infarcts. Patchy to confluent supratentorial and to lesser extent pontine white matter FLAIR T2 hyperintensities. No midline shift, mass effect or masses. No abnormal extra-axial fluid collections. VASCULAR: Normal major intracranial vascular flow voids present at skull base. SKULL AND UPPER CERVICAL SPINE: No abnormal sellar expansion. No suspicious calvarial bone marrow signal. Craniocervical junction maintained. SINUSES/ORBITS: The mastoid air-cells and included paranasal sinuses are well-aerated. The included ocular globes and orbital contents are non-suspicious. OTHER: None. IMPRESSION: Subcentimeter acute RIGHT cerebellar and RIGHT occipital lobe infarcts. Old small RIGHT frontal lobe infarct and bold small cerebellar infarcts. Moderate chronic small vessel ischemic disease. Electronically Signed   By: Elon Alas M.D.   On: 04/05/2016 06:49   Ct Head Code Stroke W/o Cm  Result Date: 04/04/2016 CLINICAL DATA:  Code stroke.  Right leg flaccid.  Slurred speech. EXAM: CT HEAD WITHOUT CONTRAST TECHNIQUE: Contiguous axial images were obtained from the base of the skull through the vertex without intravenous contrast. COMPARISON:  CT head  06/20/2015 FINDINGS: Brain: Generalized atrophy. Chronic microvascular ischemic change. Chronic infarct right frontal lobe. Negative for acute infarct. Negative for acute hemorrhage or mass. No shift  of the midline structures. Vascular: Negative for hyperdense vessel. Skull: Negative Sinuses/Orbits: Negative Other: None ASPECTS ([place] Stroke Program Early CT Score) - Ganglionic level infarction (caudate, lentiform nuclei, internal capsule, insula, M1-M3 cortex): 7 - Supraganglionic infarction (M4-M6 cortex): 3 Total score (0-10 with 10 being normal): 10 IMPRESSION: 1. No acute abnormality 2. ASPECTS is 10 These results were called by telephone at the time of interpretation on 04/04/2016 at 8:42 am to Dr. Leonel Ramsay, who verbally acknowledged these results. Electronically Signed   By: Franchot Gallo M.D.   On: 04/04/2016 08:44    ASSESSMENT AND PLAN:    Active Problems:   Stroke (cerebrum) (HCC)  [name] [name] is a 76 y.o. male with a history of mod non-obstructive CAD on LHC in 6/57, diastolic CHF, HTN, HLD, thoracic aortic aneurysm, possible MS, GERD, COPD, prior stroke, AKI assoc with NSAID use, tobacco abuse and bipolar depression who presented to [facility] on 04/04/16 with weakness. Code stroke was called and he was given TPA. Cardiology asked to help with management of dyspnea.   CVA s/p TPA: without residual deficits. Work up per neurology. He was on plavix as an outpatient, but plan is to start Aggrenox after out of TPA window. Assume that Norm Parcel was interrogated to r/o afib. Will make sure this was completed. No afib found on device at least through 02/23/16  Dyspnea: this has been ongoing. He has at least moderate COPD by PFTs and is followed by Dr. Chase Caller. He completed pulmonary rehab but continued to smoke. He was recently seen in the office by Richardson Dopp PA-C and found to have mild volume overload. He was diuresed as an outpatient with resolution of LE edema and 7 lbs weight loss; however,  dyspnea did not improve. Next step was to arrange nuclear stress test. I agree with this, but not sure this requires further inpatient management. It may be more prudent to let him recover from his acute CVA and get an outpatient nuclear stress test.   CAD: mod, nonobst CAD on cath in 2015. It is certainly possible that CAD could have progressed to obstructive CAD. Agree with stress test at some point. Plan is to start aggrenox. Continue statin and resume BB at discharge.   HTN: BP well controlled currently   Chronic diastolic CHF: as above, recently diuresed and LE edema resolved and weight down 228--> 221. Creat mildly bumped 1.13--> 1.34. Would continue lasix 40mg  daily.   Thoracic aortic aneurysm without rupture: 4.1 cm by CT in 6/17.  MRI/MRA planned in 6/18 for follow up.    Signed: Angelena Form, PA-C 04/05/2016 10:26 AM  Pager [phone]  Co-Sign MD  Dyspnea  - likely more pulmonary driven (COPD)  - after diuresis, still with dyspnea  - extensive workup with cath in 2015 (same issue), normal LVEDP, no severe CAD. No obvious cardiac explanation for dyspnea.   - Continue with lasix 40 QD  - Encourage conditioning effort. Pulm rehab by Dr. Chase Caller.  - Difficult issue.   - Consider Pulmonary consult if necessary.   Decreased air movement bilaterally. No wheeze, RRR, foley in place.  No JVD  CAD  - moderate 2015 cath.   Dilated ascending aorta - 4.1 CT  - follow yearly.   Please let us know if we can be of further assistance. Has close follow up with cardiology and pulmonary.  Candee Furbish, MD

## 2016-04-05 NOTE — Progress Notes (Signed)
PT Cancellation Note  Patient Details Name: [name] [name] MRN: [id number] DOB: 10/21/1940   Cancelled Treatment:    Reason Eval/Treat Not Completed: Patient not medically ready, on strict bedrest orders. Will follow-up with PT eval when medically appropriate as time allows.  Enis Gash, SPT Office-[phone]  Mabeline Caras 04/05/2016, 8:16 AM

## 2016-04-05 NOTE — Progress Notes (Signed)
OT Cancellation Note  Patient Details Name: [name] [name] MRN: [id number] DOB: 18-Sep-1940   Cancelled Treatment:    Reason Eval/Treat Not Completed: Patient not medically ready (strict bedrest order set at this time) please increase activity tolerance as appropriate.  Vonita Moss   OTR/L Pager: [phone] Office: [phone] .  04/05/2016, 7:53 AM

## 2016-04-05 NOTE — Progress Notes (Signed)
STROKE TEAM PROGRESS NOTE   SUBJECTIVE (INTERVAL HISTORY) His wife is at the bedside.  Overall, he is much improved. He has had stroke before. On plavix. Also has MS. 1 mo ago an episode of confusion with increasing confusion the past few months. New interval stroke seen on imaging since last admission in June 2017.   OBJECTIVE Temp:  [97.8 F (36.6 C)-98.6 F (37 C)] 98.6 F (37 C) (04/02 0800) Pulse Rate:  [71-84] 73 (04/02 0900) Cardiac Rhythm: Normal sinus rhythm (04/02 0800) Resp:  [10-20] 19 (04/02 1000) BP: (102-161)/(55-107) 149/107 (04/02 0900) SpO2:  [92 %-100 %] 98 % (04/02 0900)  CBC:   Recent Labs Lab 03/30/16 1047 04/04/16 0830 04/04/16 0832  WBC 8.0 7.5  --   NEUTROABS  --  5.3  --   HGB  --  13.8 14.6  HCT 40.7 42.7 43.0  MCV 99* 99.3  --   PLT 321 306  --     Basic Metabolic Panel:   Recent Labs Lab 03/30/16 1047 04/04/16 0830 04/04/16 0832  NA 138 136 138  K 5.0 4.5 4.4  CL 98 101 99*  CO2 23 26  --   GLUCOSE 85 112* 108*  BUN 12 17 21*  CREATININE 1.13 1.34* 1.30*  CALCIUM 9.2 8.8*  --    HgbA1c:  Lab Results  Component Value Date   HGBA1C 6.3 (H) 06/26/2015    PHYSICAL EXAM Pleasant elderly Caucasian male not in distress.  . Afebrile. Head is nontraumatic. Neck is supple without bruit.    Cardiac exam no murmur or gallop. Lungs are clear to auscultation. Distal pulses are well felt.  Neurological Exam ;  Awake  Alert oriented x 3. Normal speech and language.eye movements full without nystagmus.fundi were not visualized. Vision acuity and fields appear normal. Hearing is normal. Palatal movements are normal. Face symmetric. Tongue midline. Normal strength, tone, reflexes and coordination. Diminished fine finger movements on the right. Orbits left or right upper extremity. Minimum weakness of right grip. Normal sensation. Gait deferred.  ASSESSMENT/PLAN [name] [name] is a 76 y.o. male with history of stroke, CAD and CHF  presenting with R sided weakness. He received IV t-PA 04/05/2016 at 0841.   Stroke:  Small R cerebellar and R occipital lobe infarcts s/p IV tPA, infarcts embolic secondary to unknown source  Resultant  R hemiparesis improved  Code Stroke CT no acute abnormality, Aspects 10  CTA angio head and neck severe stenosis L VA. Mild stenosis origina L VA.   MRI  Small R cerebellar and R occipital lobe infarcts. Old small R frontal lobe infarct and small cerebellar infarcts. Moderate small vessel disease.   TEE 06/2015 no SOE, no PFO  Loop interrogated. No AF. No arrhythmia.  2D Echo  pending   TCD with bubbles pending   LDL 61  HgbA1c pending  SCDs for VTE prophylaxis Diet Heart Room service appropriate? Yes; Fluid consistency: Thin  clopidogrel 75 mg daily prior to admission, now on No antithrombotic as received tPA yesterday. Change plavix to  dipyridamole SR 250 mg/aspirin 25 mg orally twice a day for secondary stroke prevention. To prevent headache, most common side effect of Aggrenox, will start Aggrenox q hs x 2 weeks then increase Aggrenox to bid.  Until then, aspirin 81 mg q am x 2 weeks, then discontinue. May take Tylenol 650 mg 1 hr prior to Aggrenox for the first week, then discontinue.    Therapy recommendations:  Pending. May be OOB  Disposition:  pending   Follow up with Dr. Erlinda Hong who follows him as an OP already  Hypertension  Stable  Resume home BP medications  Long-term BP goal normotensive  Hyperlipidemia  Home meds:  lipitor 20, resumed in hospital  LDL at goal  Continue statin at discharge  Other Stroke Risk Factors  Advanced age  Former Cigarette smoker  Chewing tobacco  UDS not performed  Obesity, Body mass index is 30.05 kg/m.  Hx stroke/TIA  Interval infarct since June  06/15/2015 R frontal MCA territory infarct - embolic - unknown source. Loop placed.  Family hx stroke (father, mother)  Coronary artery disease  Thoracic aortic  aneurysm  (was 4.1 cm in June)  Chronic diastolic CHF  Other Active Problems  Cognitive deficits, progressing since last admission per wife  Creatinine 1.3  Multiple sclerosis - not active in June (no contrast enhancement), Dr. Leonie Man not sure it is in fact MS. Keep Follow up appt with DR. Xu who can decide if MS referral appropriate   COPD  GERD  depression  Hospital day # [employer] for Pager information 04/05/2016 1:00 PM  I have personally examined this patient, reviewed notes, independently viewed imaging studies, participated in medical decision making and plan of care.ROS completed by me personally and pertinent positives fully documented  I have made any additions or clarifications directly to the above note. Agree with note above. He presented with sudden onset of right-sided weakness and incoordination and has shown significant improvement after IV tPA. MRI scan shows small right cerebellar and occipital infarcts he has a previous history of cryptogenic strokes and so far workup has been negative. The loop recorder was interrogated during today's visit and no A. fib was found. Etiology for his strokes remain cryptogenic. Continue close neurological monitoring and tight blood pressure control as per post TPA protocol Check transcranial Doppler bubble study to look for small PFO not seen on the previous TEE. Mobilize out of bed. Physical occupational therapy consults. Continue Plavix for stroke prevention. Long discussion with patient and wifeat the bedside and answered questions This patient is critically ill and at significant risk of neurological worsening, death and care requires constant monitoring of vital signs, hemodynamics,respiratory and cardiac monitoring, extensive review of multiple databases, frequent neurological assessment, discussion with family, other specialists and medical decision making of high complexity.I have made any  additions or clarifications directly to the above note.This critical care time does not reflect procedure time, or teaching time or supervisory time of PA/NP/Med Resident etc but could involve care discussion time.  I spent 30 minutes of neurocritical care time  in the care of  this patient.      Antony Contras, MD Medical Director [facility] Pager: [phone] 04/05/2016 1:43 PM    To contact Stroke Continuity provider, please refer to [email or web address] After hours, contact General Neurology

## 2016-04-05 NOTE — Progress Notes (Signed)
VASCULAR LAB PRELIMINARY  PRELIMINARY  PRELIMINARY  PRELIMINARY  Transcranial Doppler with bubbles completed.    Preliminary report:  No HITS heard. No apparent PFO  [name] [name], RVS 04/05/2016, 5:02 PM

## 2016-04-05 NOTE — Evaluation (Signed)
Physical Therapy Evaluation Patient Details Name: [name] [name] MRN: [id number] DOB: 09/01/1940 Today's Date: 04/05/2016   History of Present Illness  76 yo male s/p Subcentimeter acute RIGHT cerebellar and RIGHT occipital lobe. Pertinent PMH includes thoracic aortic aneurysm, MS, RBBB, MS, CVA, mild dementia, COPD, CKD, CAD, depression, CHF.  Clinical Impression  Patient presents with dyspnea on exertion, generalized weakness and impaired endurance s/p above. Tolerated gait training with supervision for safety due to impulsivity and SOB. Reports dyspnea seems improved from baseline but still seems to be a limiting factor with regards to mobility. Pt reports no falls. Will follow acutely to perform higher level balance challenges to maximize independence and mobility prior to return home. Education re: sign/symptoms of CVA.     Follow Up Recommendations No PT follow up;Supervision - Intermittent    Equipment Recommendations  None recommended by PT    Recommendations for Other Services OT consult     Precautions / Restrictions Precautions Precautions: Fall Precaution Comments: hx of MS; dizziness Restrictions Weight Bearing Restrictions: No      Mobility  Bed Mobility Overal bed mobility: Modified Independent             General bed mobility comments: Sitting in chair upon PT arrival.   Transfers Overall transfer level: Needs assistance Equipment used: None Transfers: Sit to/from Stand Sit to Stand: Supervision         General transfer comment: Supervision for safety due to impulsivity- baseline.  Ambulation/Gait Ambulation/Gait assistance: Supervision Ambulation Distance (Feet): 175 Feet Assistive device: None Gait Pattern/deviations: Step-through pattern;Decreased stride length;Shuffle Gait velocity: decreased Gait velocity interpretation: <1.8 ft/sec, indicative of risk for recurrent falls General Gait Details: Mildly unsteady gait with shuffling like gait  pattern 3/4 DOE. 1 forced standing rest break. VSS.  Stairs            Wheelchair Mobility    Modified Rankin (Stroke Patients Only) Modified Rankin (Stroke Patients Only) Pre-Morbid Rankin Score: Slight disability Modified Rankin: Slight disability     Balance Overall balance assessment: Needs assistance Sitting-balance support: Feet supported;No upper extremity supported Sitting balance-Leahy Scale: Good     Standing balance support: During functional activity;No upper extremity supported Standing balance-Leahy Scale: Fair Standing balance comment: Less dizziness with standing.                             Pertinent Vitals/Pain Pain Assessment: No/denies pain    Home Living Family/patient expects to be discharged to:: Private residence Living Arrangements: Spouse/significant other Available Help at Discharge: Family;Available 24 hours/day Type of Home: House Home Access: Level entry     Home Layout: One level Home Equipment: Grab bars - tub/shower;Crutches;Walker - 2 wheels;Shower seat - built in      Prior Function Level of Independence: Independent         Comments: was just on a trip to [place] recently; reports dizziness constantly from [employer]; reports SOB as well and only able to walk short distances and then needs rest breaks.     Hand Dominance   Dominant Hand: Right    Extremity/Trunk Assessment   Upper Extremity Assessment Upper Extremity Assessment: Defer to OT evaluation RUE Deficits / Details: ROM 100 degrees s/p x2 shoulder surg and this is baseline    Lower Extremity Assessment Lower Extremity Assessment: Overall WFL for tasks assessed (Grossly ~4-5/5 throughout)    Cervical / Trunk Assessment Cervical / Trunk Assessment: Normal  Communication   Communication:  HOH;Other (comment) (sound amplifier)  Cognition Arousal/Alertness: Awake/alert Behavior During Therapy: WFL for tasks assessed/performed Overall Cognitive Status:  Within Functional Limits for tasks assessed                                        General Comments General comments (skin integrity, edema, etc.): Wife present during session.     Exercises     Assessment/Plan    PT Assessment Patient needs continued PT services  PT Problem List Decreased mobility;Decreased safety awareness;Decreased balance;Cardiopulmonary status limiting activity;Decreased activity tolerance       PT Treatment Interventions Therapeutic activities;Gait training;Therapeutic exercise;Patient/family education;Balance training;Functional mobility training    PT Goals (Current goals can be found in the Care Plan section)  Acute Rehab PT Goals Patient Stated Goal: to get home today PT Goal Formulation: With patient Time For Goal Achievement: 04/19/16 Potential to Achieve Goals: Good    Frequency Min 3X/week   Barriers to discharge        Co-evaluation               End of Session Equipment Utilized During Treatment: Gait belt Activity Tolerance: Patient tolerated treatment well;Other (comment) (DOE.) Patient left: in chair;with call bell/phone within reach;with family/visitor present Nurse Communication: Mobility status PT Visit Diagnosis: Unsteadiness on feet (R26.81);Muscle weakness (generalized) (M62.81)    Time: 0045-9977 PT Time Calculation (min) (ACUTE ONLY): 20 min   Charges:   PT Evaluation $PT Eval Low Complexity: 1 Procedure     PT G Codes:        Wray Kearns, PT, DPT [phone]    Marguarite Arbour A Garl Speigner 04/05/2016, 11:15 AM

## 2016-04-06 ENCOUNTER — Inpatient Hospital Stay (HOSPITAL_COMMUNITY): Payer: Medicare Other

## 2016-04-06 DIAGNOSIS — I340 Nonrheumatic mitral (valve) insufficiency: Secondary | ICD-10-CM

## 2016-04-06 DIAGNOSIS — R4189 Other symptoms and signs involving cognitive functions and awareness: Secondary | ICD-10-CM

## 2016-04-06 DIAGNOSIS — Z823 Family history of stroke: Secondary | ICD-10-CM

## 2016-04-06 LAB — HEMOGLOBIN A1C
Hgb A1c MFr Bld: 5.6 % (ref 4.8–5.6)
Mean Plasma Glucose: 114 mg/dL

## 2016-04-06 LAB — ECHOCARDIOGRAM COMPLETE
Height: 72 in
Weight: 3545 oz

## 2016-04-06 MED ORDER — ACETAMINOPHEN 325 MG PO TABS: 650.0000 mg | ORAL_TABLET | Freq: Every day | ORAL | 0 refills | Status: DC

## 2016-04-06 MED ORDER — ASPIRIN-DIPYRIDAMOLE ER 25-200 MG PO CP12: 1.0000 | ORAL_CAPSULE | Freq: Two times a day (BID) | ORAL | 1 refills | Status: DC

## 2016-04-06 MED ORDER — ASPIRIN-DIPYRIDAMOLE ER 25-200 MG PO CP12: 1.0000 | ORAL_CAPSULE | Freq: Every day | ORAL | 0 refills | Status: DC

## 2016-04-06 MED ORDER — PANTOPRAZOLE SODIUM 40 MG PO TBEC: 40.0000 mg | DELAYED_RELEASE_TABLET | Freq: Every day | ORAL | Status: DC

## 2016-04-06 NOTE — Care Management Note (Signed)
Case Management Note Marvetta Gibbons RN, BSN Unit 2W-Case Manager [phone]  Patient Details  Name: [name] [name] MRN: [id number] Date of Birth: [birth year]  Subjective/Objective:   Pt admitted with CVA                 Action/Plan: PTA pt lived at home with wife- per PT/OT evals no recommendations for [facility] services or DME- plan for pt to return home with wife- no CM needs noted for discharge.   Expected Discharge Date:  04/06/16               Expected Discharge Plan:  Home/Self Care  In-House Referral:     Discharge planning Services  CM Consult  Post Acute Care Choice:  NA Choice offered to:  NA  DME Arranged:    DME Agency:     HH Arranged:    HH Agency:     Status of Service:  Completed, signed off  If discussed at [place] of Stay Meetings, dates discussed:    Discharge Disposition: home/self care   Additional Comments:  Dawayne Patricia, RN 04/06/2016, 2:25 PM

## 2016-04-06 NOTE — Discharge Summary (Signed)
Stroke Discharge Summary  Patient ID: [name] [name]   MRN: [id number]      DOB: 20-Jun-1940  Date of Admission: 04/04/2016 Date of Discharge: 04/06/2016  Attending Physician:  Garvin Fila, MD, Stroke MD Consultant(s):   Candee Furbish, MD ( cardiology ) Patient's PCP:  [name], MD  DISCHARGE DIAGNOSIS:  Principal Problem:   Embolic stroke [facility]) s/p IV tPA, R cerebellar and occipital, source unknown Active Problems:   Possible Multiple sclerosis ([place])   Hypertensive heart disease with CHF (congestive heart failure) ([place])   Depression   Chronic diastolic CHF (congestive heart failure) ([place])   CAD (coronary artery disease)   COPD (chronic obstructive pulmonary disease) ([place])   Hyperlipidemia   Gastroesophageal reflux disease without esophagitis   Thoracic aortic aneurysm ([place])   History of ischemic right MCA stroke   Dysarthria   Family history of stroke   Cognitive deficits  Obesity, BMI: Body mass index is 30.05 kg/m.  Past Medical History:  Diagnosis Date  . Arthritis    s/p TKR  . Bladder cancer (HCC)    [facility], Ta low grade papillary urothileal carcinoma  . BPH (benign prostatic hyperplasia)   . Brugada syndrome    Possible Type II Brugada ECG pattern. No family history of SCD, no syncope, no tachypalpitations.  [place] Kitchen CAD (coronary artery disease)    a. LHC 1/15 - mid LCx 50 and 60%, proximal RCA 50%  . Chronic diastolic CHF (congestive heart failure) ([place])   . CKD (chronic kidney disease)   . COPD (chronic obstructive pulmonary disease) ([place])    Prior heavy smoker. PFTs (3/11): FVC 87%, FEV1 73%, ratio 0.57, DLCO 75%, TLC 121%. Moderate obstructive defect. PFTs (9/15): Only minimal obstruction, sugPrior heavy smoker. PFTs (3/11): FVC 87%, FEV1 73%, ratio 0.57, DLCO 75%, TLC 121%. Moderate obstructive defect. PFTs (9/15) minimal obstruction, poss asthma component   . Depression    with bipolar tendencies  . DOE (dyspnea on exertion)    a. Myoview 8/09- EF  57%, no ischemia // b. Myoview (3/11) EF 68%, diaphragmatic attenuation, no ischemia //  c Echo (4/11) EF 84-13%, mild diastolic dysfunction, PASP 38 mmHg // d. PFTs 9/15 minimal obstruction  /  e. CT negative for ILD  . GERD (gastroesophageal reflux disease)   . History of Doppler ultrasound    Carotid US 3/11 negative for significant stenosis  //  carotid US 6/17: Mild bilateral plaque, 1-39% ICA  . History of echocardiogram    a. Echo 12/14 Inferior and distal septal HK, mild LVH, EF 50-55%, mild MR, mild LAE  //  b. Echo 6/17: Mild focal basal septal hypertrophy, EF 60-65%, normal wall motion, grade 1 diastolic dysfunction, mild AI  . HLD (hyperlipidemia)   . Hypertensive heart disease with CHF (congestive heart failure) ([place]) 04/08/2009  . Low testosterone   . Lung nodule    a. CT in 2015 >> PET in 12/15 not sugg of malignancy   . LVH (left ventricular hypertrophy)    a. Echo 12/14 Inferior and distal septal HK, mild LVH, EF 50-55%, mild MR, mild LAE  . Mild dementia   . Mitral regurgitation    Echo (4/11) with PISA ERO 0.3 cm^2 and regurgitant volume 41 mL (moderate MR). Echo (12/14) with mild MR.   . MS (multiple sclerosis) ([place])   . MS (multiple sclerosis) ([place])   . Right bundle branch block   . Stroke ([place])   . Thoracic aortic aneurysm ([place])  4.1 cm 2017  . Urinary retention with incomplete bladder emptying    receives botox injections and treatment for BPH through [facility]   Past Surgical History:  Procedure Laterality Date  . APPENDECTOMY    . EP IMPLANTABLE DEVICE N/A 06/23/2015   Procedure: Loop Recorder Insertion;  Surgeon: Deboraha Sprang, MD;  Location: [facility] CV LAB;  Service: Cardiovascular;  Laterality: N/A;  . HEMORRHOID SURGERY    . LEFT HEART CATHETERIZATION WITH CORONARY ANGIOGRAM N/A 01/05/2013   Procedure: LEFT HEART CATHETERIZATION WITH CORONARY ANGIOGRAM;  Surgeon: Larey Dresser, MD;  Location: [facility] CATH LAB;  Service: Cardiovascular;  Laterality: N/A;  .  ROTATOR CUFF REPAIR    . TEE WITHOUT CARDIOVERSION N/A 06/23/2015   Procedure: TRANSESOPHAGEAL ECHOCARDIOGRAM (TEE);  Surgeon: Fay Records, MD;  Location: [facility] ENDOSCOPY;  Service: Cardiovascular;  Laterality: N/A;  . TONSILLECTOMY    . TOTAL KNEE ARTHROPLASTY Right   . TRANSURETHRAL RESECTION OF PROSTATE      Allergies as of 04/06/2016      Reactions   Acetylcholine    Patient was suspected of having "Brugada Syndrome": (BrS) is a genetic condition that results in abnormal electrical activity within the heart, increasing the risk of sudden cardiac death. Those affected may have episodes of passing out.   Alcohol-sulfur [sulfur] Other (See Comments)   Patient was suspected of having "Brugada Syndrome"   Amitriptyline Other (See Comments)   Patient was suspected of having "Brugada Syndrome"   Bupivacaine Other (See Comments)   Patient was suspected of having "Brugada Syndrome"   Clomipramine Hcl Other (See Comments)   Patient was suspected of having "Brugada Syndrome"   Cocaine Other (See Comments)   Patient was suspected of having "Brugada Syndrome"   Desipramine Other (See Comments)   Patient was suspected of having "Brugada Syndrome"   Ergonovine Other (See Comments)   Patient was suspected of having "Brugada Syndrome"   Flecainide Other (See Comments)   Patient was suspected of having "Brugada Syndrome"   Lithium Other (See Comments)   Patient was suspected of having "Brugada Syndrome"   Loxapine Other (See Comments)   Patient was suspected of having "Brugada Syndrome"   Nortriptyline Other (See Comments)   Patient was suspected of having "Brugada Syndrome"   Oxcarbazepine Other (See Comments)   Patient was suspected of having "Brugada Syndrome"   Procainamide Other (See Comments)   Patient was suspected of having "Brugada Syndrome"   Procaine Other (See Comments)   Patient was suspected of having "Brugada Syndrome"   Propafenone Other (See Comments)   Patient was suspected of  having "Brugada Syndrome"   Propofol Other (See Comments)   Patient was suspected of having "Brugada Syndrome"   Trifluoperazine Other (See Comments)   Patient was suspected of having "Brugada Syndrome"      Medication List    STOP taking these medications   clopidogrel 75 MG tablet Commonly known as:  PLAVIX   ondansetron 4 MG tablet Commonly known as:  ZOFRAN     TAKE these medications   acetaminophen 325 MG tablet Commonly known as:  TYLENOL Take 650 mg by mouth every 6 (six) hours as needed (for pain or headaches). What changed:  Another medication with the same name was added. Make sure you understand how and when to take each.   acetaminophen 325 MG tablet Commonly known as:  TYLENOL Take 2 tablets (650 mg total) by mouth at bedtime. What changed:  You were already taking a medication  with the same name, and this prescription was added. Make sure you understand how and when to take each.   amantadine 100 MG capsule Commonly known as:  SYMMETREL TAKE 1 CAPSULE BY MOUTH TWICE DAILY WITH BREAKFAST AND LUNCH   amLODipine 10 MG tablet Commonly known as:  NORVASC Take 1 tablet (10 mg total) by mouth daily.   atorvastatin 20 MG tablet Commonly known as:  LIPITOR TAKE 1 TABLET DAILY   B-12 1000 MCG Caps Take 1,000 mcg by mouth daily.   bisoprolol 10 MG tablet Commonly known as:  ZEBETA Take 1 tablet (10 mg total) by mouth daily.   cholecalciferol 400 units Tabs tablet Commonly known as:  VITAMIN D Take 400 Units by mouth daily.   dipyridamole-aspirin 200-25 MG 12hr capsule Commonly known as:  AGGRENOX Take 1 capsule by mouth at bedtime.   dipyridamole-aspirin 200-25 MG 12hr capsule Commonly known as:  AGGRENOX Take 1 capsule by mouth 2 (two) times daily. Start taking on:  04/20/2016   doxazosin 2 MG tablet Commonly known as:  CARDURA Take 1 tablet (2 mg total) by mouth daily.   folic acid 1 MG tablet Commonly known as:  FOLVITE Take 1 mg by mouth  daily.   furosemide 40 MG tablet Commonly known as:  LASIX Take 40 mg by mouth daily.   HYDROcodone-acetaminophen 10-325 MG tablet Commonly known as:  NORCO Take 1 tablet by mouth every 6 (six) hours as needed for moderate pain or severe pain.   losartan 50 MG tablet Commonly known as:  COZAAR Take 100 mg by mouth daily.   MAGNESIUM CITRATE PO Take 0.5-1 Bottles by mouth once as needed (for mild constipation).   pantoprazole 40 MG tablet Commonly known as:  PROTONIX Take 1 tablet (40 mg total) by mouth daily.   PROAIR HFA 108 (90 Base) MCG/ACT inhaler Generic drug:  albuterol USE 1 TO 2 INHALATIONS EVERY 6 HOURS AS NEEDED FOR WHEEZING OR SHORTNESS OF BREATH   rOPINIRole 1 MG tablet Commonly known as:  REQUIP TAKE 1 TABLET AT BEDTIME   SPIRIVA HANDIHALER 18 MCG inhalation capsule Generic drug:  tiotropium INHALE THE CONTENTS OF 1 CAPSULE WITH 2         INHALATIONS ONCE DAILY AS DIRECTED   venlafaxine XR 150 MG 24 hr capsule Commonly known as:  EFFEXOR-XR TAKE 1 CAPSULE TWICE A DAY       LABORATORY STUDIES CBC    Component Value Date/Time   WBC 7.5 04/04/2016 0830   RBC 4.30 04/04/2016 0830   HGB 14.6 04/04/2016 0832   HCT 43.0 04/04/2016 0832   HCT 40.7 03/30/2016 1047   PLT 306 04/04/2016 0830   PLT 321 03/30/2016 1047   MCV 99.3 04/04/2016 0830   MCV 99 (H) 03/30/2016 1047   MCH 32.1 04/04/2016 0830   MCHC 32.3 04/04/2016 0830   RDW 13.7 04/04/2016 0830   RDW 14.0 03/30/2016 1047   LYMPHSABS 1.0 04/04/2016 0830   MONOABS 0.5 04/04/2016 0830   EOSABS 0.7 04/04/2016 0830   BASOSABS 0.1 04/04/2016 0830   CMP    Component Value Date/Time   NA 138 04/04/2016 0832   NA 138 03/30/2016 1047   K 4.4 04/04/2016 0832   CL 99 (L) 04/04/2016 0832   CO2 26 04/04/2016 0830   GLUCOSE 108 (H) 04/04/2016 0832   GLUCOSE 75 06/20/2015 0855   BUN 21 (H) 04/04/2016 0832   BUN 12 03/30/2016 1047   CREATININE 1.30 (H) 04/04/2016 3151  CREATININE 0.90 09/11/2015  1223   CALCIUM 8.8 (L) 04/04/2016 0830   PROT 7.0 04/04/2016 0830   ALBUMIN 4.1 04/04/2016 0830   AST 23 04/04/2016 0830   ALT 15 (L) 04/04/2016 0830   ALKPHOS 74 04/04/2016 0830   BILITOT 0.6 04/04/2016 0830   GFRNONAA 51 (L) 04/04/2016 0830   GFRNONAA 84 09/11/2015 1223   GFRAA 59 (L) 04/04/2016 0830   GFRAA >89 09/11/2015 1223   COAGS Lab Results  Component Value Date   INR 0.97 04/04/2016   INR 1.02 06/20/2015   INR 0.95 06/15/2015   Lipid Panel    Component Value Date/Time   CHOL 134 04/05/2016 0436   TRIG 150 (H) 04/05/2016 0436   HDL 43 04/05/2016 0436   CHOLHDL 3.1 04/05/2016 0436   VLDL 30 04/05/2016 0436   LDLCALC 61 04/05/2016 0436   HgbA1C  Lab Results  Component Value Date   HGBA1C 5.6 04/05/2016   Urinalysis    Component Value Date/Time   COLORURINE YELLOW 04/01/2016 1406   APPEARANCEUR CLOUDY (A) 04/01/2016 1406   LABSPEC 1.010 04/01/2016 1406   PHURINE 5.5 04/01/2016 1406   GLUCOSEU NEGATIVE 04/01/2016 1406   HGBUR NEGATIVE 04/01/2016 1406   BILIRUBINUR NEGATIVE 04/01/2016 1406   KETONESUR NEGATIVE 04/01/2016 1406   PROTEINUR NEGATIVE 04/01/2016 1406   UROBILINOGEN 0.2 07/04/2013 1050   NITRITE NEGATIVE 04/01/2016 1406   LEUKOCYTESUR 2+ (A) 04/01/2016 1406     SIGNIFICANT DIAGNOSTIC STUDIES Ct Head Code Stroke W/o Cm 04/04/2016 1. No acute abnormality 2. ASPECTS is 10  Ct Angio Head W Or Wo Contrast Ct Angio Neck W Or Wo Contrast 04/04/2016 No significant carotid artery stenosis. Anterior and middle cerebral arteries patent without significant stenosis or occlusion Severe stenosis origin of the left vertebral artery and mild stenosis origin of the right vertebral artery. Posterior intracranial circulation is patent.   Mr Brain Wo Contrast 04/05/2016 Subcentimeter acute RIGHT cerebellar and RIGHT occipital lobe infarcts. Old small RIGHT frontal lobe infarct and bold small cerebellar infarcts. Moderate chronic small vessel ischemic disease.    2D Echocardiogram  - Left ventricle: The cavity size was normal. Wall thickness was normal. Systolic function was normal. The estimated ejection fraction was in the range of 55% to 60%. Wall motion was normal; there were no regional wall motion abnormalities. Left ventricular diastolic function parameters were normal, when adjusted for age. - Aortic valve: There was mild regurgitation. - Mitral valve: There was mild regurgitation.  TCD with bubble study No HITS. No PFO   HISTORY OF PRESENT ILLNESS [name] [name] is a 76 y.o. male with a history of stroke, CAD, CHF who presents with right leg weakness that started acutely around 7 AM. He states that he got up at 6:30 AM (LKW 630a 04/04/2016), and was completely normal, walking around and making coffee. He then had sudden onset of right leg weakness. He denies leg pain, denies back pain, denies neck pain. Of note, he has had slurred speech that has been present for little bit over a month. 911 was called and he was brought to the emergency department as a code stroke. He has mild right facial weakness and right leg weakness which is a disabling deficit and therefore IV TPA was offered. Dr. Leonel Ramsay discussed risks and benefits of IV TPA, including IF his slurred speech was a stroke that he may be at increased risk of bleeding. He expressed understanding and wished to proceed with IV TPA. He received IV tPA 04/04/2016 at  0841. He was admitted to the neuro ICU for further evaluation and treatment.    HOSPITAL COURSE [name] is a 75 y.o. male with history of stroke, CAD and CHF presenting with R sided weakness. He received IV t-PA 04/05/2016 at 0841.   Stroke:  Small R cerebellar and R occipital lobe infarcts s/p IV tPA, infarcts embolic secondary to unknown source  Resultant  R hemiparesis improved  Code Stroke CT no acute abnormality, Aspects 10  CTA angio head and neck severe stenosis L VA. Mild stenosis origina L VA.   MRI   Small R cerebellar and R occipital lobe infarcts. Old small R frontal lobe infarct and small cerebellar infarcts. Moderate small vessel disease.   TEE 06/2015 no SOE, no PFO  Loop interrogated. No AF. No arrhythmia.  2D Echo  55-60%, no SOE  TCD with bubbles no PFO, no HITS  LDL 61  HgbA1c 5.6  clopidogrel 75 mg daily prior to admission, now on No antithrombotic as received tPA yesterday. Change plavix to  dipyridamole SR 250 mg/aspirin 25 mg orally twice a day for secondary stroke prevention. To prevent headache, most common side effect of Aggrenox, will start Aggrenox q hs x 2 weeks then increase Aggrenox to bid.  Until then, aspirin 81 mg q am x 2 weeks, then discontinue. May take Tylenol 650 mg 1 hr prior to Aggrenox for the first week, then discontinue.    Therapy recommendations:  no therapy needs  Disposition:  return home with wife  Follow up with Dr. Erlinda Hong who follows him as an OP already  Hypertension  Stable  Resumed home BP medications  Long-term BP goal normotensive  Hyperlipidemia  Home meds:  lipitor 20, resumed in hospital  LDL at goal  Continue statin at discharge  Other Stroke Risk Factors  Advanced age  Former Cigarette smoker  Chewing tobacco  UDS not performed  Obesity, Body mass index is 30.05 kg/m.  Hx stroke/TIA  Interval infarct on imaging since June 2017, pt unaware  06/15/2015 R frontal MCA territory infarct - embolic - unknown source. Loop placed.  Family hx stroke (father, mother)  Coronary artery disease - mod per 2015 cath  Thoracic aortic aneurysm  (was 4.1 cm in June)  Chronic diastolic CHF  Other Active Problems  Cognitive deficits, progressing since last admission per wife  Creatinine 1.3  Multiple sclerosis - not active in June (no contrast enhancement), Dr. Leonie Man not sure it is in fact MS. Keep Follow up appt with DR. Xu who can decide if MS referral appropriate   COPD  GERD  Depression  Dyspnea. Card  consult. Hx CHF followed by Richardson Dopp as OP. Bout recently. Now felt to be more pulmonary driven (COPD) continue lasix. Continue close follow up with pulmonary and cardiology  DISCHARGE EXAM Blood pressure (!) 150/76, pulse 84, temperature 97.8 F (36.6 C), temperature source Oral, resp. rate 20, height 6' (1.829 m), weight 100.5 kg (221 lb 9 oz), SpO2 97 %. Pleasant elderly Caucasian male not in distress.  . Afebrile. Head is nontraumatic. Neck is supple without bruit.    Cardiac exam no murmur or gallop. Lungs are clear to auscultation. Distal pulses are well felt. Neurological Exam ;  Awake  Alert oriented x 3. Normal speech and language.eye movements full without nystagmus.fundi were not visualized. Vision acuity and fields appear normal. Hearing is normal. Palatal movements are normal. Face symmetric. Tongue midline. Normal strength, tone, reflexes and coordination. Diminished fine finger movements  on the right. Orbits left or right upper extremity. Minimum weakness of right grip. Normal sensation. Gait deferred.  Discharge Diet   Diet Heart Room service appropriate? Yes; Fluid consistency: Thin liquids  DISCHARGE PLAN  Disposition:  Home with wife  dipyridamole SR 250 mg/aspirin 25 mg twice a day for secondary stroke prevention. (start daily x 2 weeks then increase to bid. Tylenol nightly x 1 week)  Ongoing risk factor control by Primary Care Physician at time of discharge  Follow-up [facility] TOM, MD in 2 weeks.  Continue close follow up with cardiology and pulmonology as scheduled  Follow-up with Dr. Rosalin Hawking, office appointment 05/20/16 at 1000  45 minutes were spent preparing discharge.  [place] for Pager information 04/06/2016 2:16 PM   I have personally examined this patient, reviewed notes, independently viewed imaging studies, participated in medical decision making and plan of care.ROS completed by me personally and  pertinent positives fully documented  I have made any additions or clarifications directly to the above note. Agree with note above.   Antony Contras, MD Medical Director [facility] Pager: [phone] 04/07/2016 2:56 PM

## 2016-04-06 NOTE — Progress Notes (Signed)
Occupational Therapy Treatment Patient Details Name: [name] [name] MRN: [id number] DOB: 18-Jul-1940 Today's Date: 04/06/2016    History of present illness 76 yo male s/p Subcentimeter acute RIGHT cerebellar and RIGHT occipital lobe. Pertinent PMH includes thoracic aortic aneurysm, MS, RBBB, MS, CVA, mild dementia, COPD, CKD, CAD, depression, CHF.   OT comments  Pt demonstrating progress toward OT goals this session. He reports fatigue following standing ADL tasks and educated pt and family concerning energy conservation strategies to incorporate into daily routine. They verbalized understanding. He was able to complete LB dressing, toilet transfers, and standing grooming tasks with supervision. D/C plan remains appropriate. OT will continue to follow acutely.  Follow Up Recommendations  No OT follow up    Equipment Recommendations  None recommended by OT    Recommendations for Other Services      Precautions / Restrictions Precautions Precautions: Fall Precaution Comments: hx of MS; dizziness Restrictions Weight Bearing Restrictions: No       Mobility Bed Mobility Overal bed mobility: Modified Independent             General bed mobility comments: OOB in chair on arrival  Transfers Overall transfer level: Needs assistance Equipment used: None Transfers: Sit to/from Stand Sit to Stand: Supervision         General transfer comment: Supervision for safety and pt slightly impulsive.    Balance Overall balance assessment: Needs assistance Sitting-balance support: Feet supported;No upper extremity supported Sitting balance-Leahy Scale: Good     Standing balance support: No upper extremity supported;During functional activity Standing balance-Leahy Scale: Fair Standing balance comment: Supervision for safety with dynamic activity. No dizziness reported with standing.                  Standardized Balance Assessment Standardized Balance Assessment :  Dynamic Gait Index   Dynamic Gait Index Gait with Horizontal Head Turns: Moderate Impairment Gait with Vertical Head Turns: Moderate Impairment     ADL either performed or assessed with clinical judgement   ADL Overall ADL's : Needs assistance/impaired     Grooming: Wash/dry hands;Supervision/safety;Standing               Lower Body Dressing: Supervision/safety;Sit to/from stand Lower Body Dressing Details (indicate cue type and reason): donned shorts Toilet Transfer: Supervision/safety;BSC       Tub/ Shower Transfer: Supervision/safety   Functional mobility during ADLs: Supervision/safety General ADL Comments: Pt and wife educated on energy conservation strategies as pt fatigues easily.     Vision   Vision Assessment?: Yes Eye Alignment: Within Functional Limits Ocular Range of Motion: Within Functional Limits Alignment/Gaze Preference: Within Defined Limits Tracking/Visual Pursuits: Able to track stimulus in all quads without difficulty Saccades: Within functional limits Convergence: Within functional limits Visual Fields: No apparent deficits Additional Comments: Pt able to complete visual construction activity to accurately draw a clock. Pt able to demonstrate organized scanning pattern on scattered sheet.   Perception     Praxis      Cognition Arousal/Alertness: Awake/alert Behavior During Therapy: WFL for tasks assessed/performed Overall Cognitive Status: Within Functional Limits for tasks assessed                                          Exercises     Shoulder Instructions       General Comments Pt reports he goes down to the basement some at home. Educated to  stay on first level as much as possible and to have someone with him when performing stairs at home to increase safety.     Pertinent Vitals/ Pain       Pain Assessment: No/denies pain  Home Living                                          Prior  Functioning/Environment              Frequency  Min 2X/week        Progress Toward Goals  OT Goals(current goals can now be found in the care plan section)  Progress towards OT goals: Progressing toward goals  Acute Rehab OT Goals Patient Stated Goal: to get home today OT Goal Formulation: With patient Time For Goal Achievement: 04/19/16 Potential to Achieve Goals: Good ADL Goals Pt Will Perform Grooming: with modified independence;standing Pt Will Perform Upper Body Bathing: with modified independence;standing Pt Will Perform Lower Body Bathing: with modified independence;sit to/from stand Pt Will Transfer to Toilet: with modified independence;bedside commode;ambulating  Plan Discharge plan remains appropriate    Co-evaluation                 End of Session Equipment Utilized During Treatment: Gait belt  OT Visit Diagnosis: Unsteadiness on feet (R26.81)   Activity Tolerance Patient tolerated treatment well   Patient Left in chair;with call bell/phone within reach;with family/visitor present;with chair alarm set   Nurse Communication Mobility status;Precautions        Time: 1300-1320 OT Time Calculation (min): 20 min  Charges: OT General Charges $OT Visit: 1 Procedure OT Treatments $Therapeutic Activity: 8-22 mins   Norman Herrlich, MS OTR/L  Pager: [place] Step[place]nie Campo 04/06/2016, 2:28 PM

## 2016-04-06 NOTE — Progress Notes (Signed)
qPhysical Therapy Treatment Patient Details Name: [name] [name] MRN: [id number] DOB: 11-Sep-1940 Today's Date: 04/06/2016    History of Present Illness 76 yo male s/p Subcentimeter acute RIGHT cerebellar and RIGHT occipital lobe. Pertinent PMH includes thoracic aortic aneurysm, MS, RBBB, MS, CVA, mild dementia, COPD, CKD, CAD, depression, CHF.    PT Comments    Pt progressing towards goals, however, pt continues to be limited in ambulation distance secondary to fatigue from MS. Educated about importance of fatigue recognition at home and activity pacing. Current recommendations remain appropriate. Will continue to follow to increase functional mobility independence.   Follow Up Recommendations  No PT follow up;Supervision - Intermittent     Equipment Recommendations  None recommended by PT    Recommendations for Other Services       Precautions / Restrictions Precautions Precautions: Fall Precaution Comments: hx of MS; dizziness Restrictions Weight Bearing Restrictions: No    Mobility  Bed Mobility Overal bed mobility: Modified Independent             General bed mobility comments: Required increased time, however, no assist.   Transfers Overall transfer level: Needs assistance Equipment used: None Transfers: Sit to/from Stand Sit to Stand: Supervision         General transfer comment: Supervision for safety. Safety cues to wait for PT for mobility tasks.   Ambulation/Gait Ambulation/Gait assistance: Min guard;Supervision Ambulation Distance (Feet): 175 Feet Assistive device: None Gait Pattern/deviations: Step-through pattern;Decreased stride length;Shuffle Gait velocity: decreased Gait velocity interpretation: Below normal speed for age/gender General Gait Details: Slow, shuffling gait. Pt reporting MS at baseline and states he cannot lift his feet. Pt required standing rest break X 2 secondary to muscle fatigue. Pt slowed gait speed with head turns  horizontally and vertically this session.    Stairs            Wheelchair Mobility    Modified Rankin (Stroke Patients Only) Modified Rankin (Stroke Patients Only) Pre-Morbid Rankin Score: Slight disability Modified Rankin: Moderately severe disability     Balance Overall balance assessment: Needs assistance Sitting-balance support: Feet supported;No upper extremity supported Sitting balance-Leahy Scale: Good     Standing balance support: No upper extremity supported;During functional activity Standing balance-Leahy Scale: Fair Standing balance comment: No dizziness reported with standing.                  Standardized Balance Assessment Standardized Balance Assessment : Dynamic Gait Index   Dynamic Gait Index Gait with Horizontal Head Turns: Moderate Impairment Gait with Vertical Head Turns: Moderate Impairment      Cognition Arousal/Alertness: Awake/alert Behavior During Therapy: WFL for tasks assessed/performed Overall Cognitive Status: Within Functional Limits for tasks assessed                                        Exercises      General Comments General comments (skin integrity, edema, etc.): Pt reports he goes down to the basement some at home. Educated to stay on first level as much as possible and to have someone with him when performing stairs at home to increase safety.       Pertinent Vitals/Pain Pain Assessment: No/denies pain    Home Living                      Prior Function  PT Goals (current goals can now be found in the care plan section) Acute Rehab PT Goals Patient Stated Goal: to get home today PT Goal Formulation: With patient Time For Goal Achievement: 04/19/16 Potential to Achieve Goals: Good Progress towards PT goals: Progressing toward goals    Frequency    Min 3X/week      PT Plan Current plan remains appropriate    Co-evaluation             End of Session  Equipment Utilized During Treatment: Gait belt Activity Tolerance: Patient limited by fatigue (muscle fatigue from [place]) Patient left: in chair;with call bell/phone within reach Nurse Communication: Mobility status PT Visit Diagnosis: Unsteadiness on feet (R26.81);Muscle weakness (generalized) (M62.81)     Time: 1388-7195 PT Time Calculation (min) (ACUTE ONLY): 17 min  Charges:  $Gait Training: 8-22 mins                    G Codes:       Nicky Pugh, PT, DPT  Acute Rehabilitation Services  Pager: [phone]   Army Melia 04/06/2016, 11:16 AM

## 2016-04-06 NOTE — Progress Notes (Signed)
  Speech Language Pathology:    Patient Details Name: [name] [name] MRN: [id number] DOB: 1940/09/10 Today's Date: 04/06/2016 Time:  -     Assessment / Plan / Recommendation Clinical Impression  Screened patient for speech, language, and cognition (no formal evaluation warranted). Pt and wife reported that his speech has improved and that they do not have any concerns in terms of cognition; ST in agreement. ST signing off.   HPI HPI: Pt is a 76 yo male s/p subcentimeter acute RIGHT cerebellar and RIGHT occipital lobe infarcts. PMH includes thoracic aortic aneurysm, MS, RBBB, MS, CVA, mild dementia, COPD, CKD, CAD, depression, CHF. Per chart, pt's "slurred" speech has improved completely.                 Fransisca Kaufmann , [place] 04/06/2016, 12:16 PM

## 2016-04-07 ENCOUNTER — Other Ambulatory Visit: Payer: Medicare Other | Admitting: *Deleted

## 2016-04-07 ENCOUNTER — Other Ambulatory Visit (HOSPITAL_COMMUNITY): Payer: Self-pay | Admitting: Nurse Practitioner

## 2016-04-07 DIAGNOSIS — I110 Hypertensive heart disease with heart failure: Secondary | ICD-10-CM

## 2016-04-07 DIAGNOSIS — R799 Abnormal finding of blood chemistry, unspecified: Secondary | ICD-10-CM

## 2016-04-07 DIAGNOSIS — I5032 Chronic diastolic (congestive) heart failure: Secondary | ICD-10-CM

## 2016-04-08 LAB — BASIC METABOLIC PANEL
BUN / CREAT RATIO: 9 — AB (ref 10–24)
BUN: 10 mg/dL (ref 8–27)
CO2: 20 mmol/L (ref 18–29)
Calcium: 9.4 mg/dL (ref 8.6–10.2)
Chloride: 94 mmol/L — ABNORMAL LOW (ref 96–106)
Creatinine, Ser: 1.1 mg/dL (ref 0.76–1.27)
GFR, EST AFRICAN AMERICAN: 76 mL/min/{1.73_m2} (ref >59)
GFR, EST NON AFRICAN AMERICAN: 66 mL/min/{1.73_m2} (ref >59)
Glucose: 82 mg/dL (ref 65–99)
Potassium: 4.6 mmol/L (ref 3.5–5.2)
SODIUM: 134 mmol/L (ref 134–144)

## 2016-04-08 LAB — PRO B NATRIURETIC PEPTIDE: NT-Pro BNP: 117 pg/mL (ref 0–376)

## 2016-04-09 MED ORDER — SULFAMETHOXAZOLE-TRIMETHOPRIM 800-160 MG PO TABS: 1.0000 | ORAL_TABLET | Freq: Two times a day (BID) | ORAL | 0 refills | Status: AC

## 2016-04-09 NOTE — Telephone Encounter (Signed)
Left message that antibiotic has been called in for UTI.

## 2016-04-09 NOTE — Telephone Encounter (Signed)
-----   Message from [name], MD sent at 04/05/2016  6:53 AM EDT ----- Patient is in hospital with stroke.  However, urine cx still shows klebsiella, apparently previous was resistant to keflex.  Could treat with bactrim ds pobid for 1 week.

## 2016-04-21 ENCOUNTER — Encounter: Payer: Self-pay | Admitting: Physician Assistant

## 2016-04-21 ENCOUNTER — Ambulatory Visit (INDEPENDENT_AMBULATORY_CARE_PROVIDER_SITE_OTHER): Payer: Medicare Other | Admitting: Physician Assistant

## 2016-04-21 VITALS — BP 128/56 | HR 68 | Ht 72.0 in | Wt 221.8 lb

## 2016-04-21 DIAGNOSIS — I712 Thoracic aortic aneurysm, without rupture, unspecified: Secondary | ICD-10-CM

## 2016-04-21 DIAGNOSIS — I5032 Chronic diastolic (congestive) heart failure: Secondary | ICD-10-CM

## 2016-04-21 DIAGNOSIS — Z8673 Personal history of transient ischemic attack (TIA), and cerebral infarction without residual deficits: Secondary | ICD-10-CM

## 2016-04-21 DIAGNOSIS — I639 Cerebral infarction, unspecified: Secondary | ICD-10-CM

## 2016-04-21 DIAGNOSIS — I2510 Atherosclerotic heart disease of native coronary artery without angina pectoris: Secondary | ICD-10-CM

## 2016-04-21 DIAGNOSIS — J438 Other emphysema: Secondary | ICD-10-CM

## 2016-04-21 DIAGNOSIS — I110 Hypertensive heart disease with heart failure: Secondary | ICD-10-CM | POA: Diagnosis not present
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
## 2016-04-21 NOTE — Patient Instructions (Addendum)
Medication Instructions:  Your physician recommends that you continue on your current medications as directed. Please refer to the Current Medication list given to you today.  Labwork: NONE ORDERED  Testing/Procedures: 1. YOU NEED TO BE SCHEDULED FOR A CHEST CT-A TO BE ONE IN THE NEXT 1-2 WEEKS   Follow-Up: DR. END IN 3 MONTHS  Any Other Special Instructions Will Be Listed Below (If Applicable).  If you need a refill on your cardiac medications before your next appointment, please call your pharmacy.

## 2016-04-23 ENCOUNTER — Ambulatory Visit (INDEPENDENT_AMBULATORY_CARE_PROVIDER_SITE_OTHER): Payer: Medicare Other | Admitting: *Deleted

## 2016-04-23 DIAGNOSIS — I639 Cerebral infarction, unspecified: Secondary | ICD-10-CM

## 2016-04-26 NOTE — Progress Notes (Signed)
Carelink Summary Report / Loop Recorder 

## 2016-04-27 ENCOUNTER — Ambulatory Visit (INDEPENDENT_AMBULATORY_CARE_PROVIDER_SITE_OTHER): Payer: Medicare Other | Admitting: Family Medicine

## 2016-04-27 ENCOUNTER — Encounter: Payer: Self-pay | Admitting: Family Medicine

## 2016-04-27 VITALS — BP 120/58 | HR 64 | Temp 97.5°F | Resp 20 | Ht 71.0 in | Wt 224.0 lb

## 2016-04-27 DIAGNOSIS — I639 Cerebral infarction, unspecified: Secondary | ICD-10-CM

## 2016-04-27 DIAGNOSIS — N390 Urinary tract infection, site not specified: Secondary | ICD-10-CM

## 2016-04-27 LAB — URINALYSIS, ROUTINE W REFLEX MICROSCOPIC
Bilirubin Urine: NEGATIVE
Glucose, UA: NEGATIVE
Hgb urine dipstick: NEGATIVE
Ketones, ur: NEGATIVE
NITRITE: NEGATIVE
PH: 6 (ref 5.0–8.0)
Protein, ur: NEGATIVE
SPECIFIC GRAVITY, URINE: 1.01 (ref 1.001–1.035)

## 2016-04-27 LAB — URINALYSIS, MICROSCOPIC ONLY
CASTS: NONE SEEN [LPF]
Crystals: NONE SEEN [HPF]
Yeast: NONE SEEN [HPF]

## 2016-04-27 NOTE — Addendum Note (Signed)
Addended by: Shary Decamp B on: 04/27/2016 02:22 PM   Modules accepted: Orders

## 2016-04-27 NOTE — Progress Notes (Signed)
Subjective:    Patient ID: [name] [name], male    DOB: Oct 18, 1940, 76 y.o.   MRN: [id number]  Medication Refill    Recently, the patient was treated for urinary tract infection at [facility]. Prior to his recent hospitalization for stroke, we repeated urinalysis which showed residual infection. Culture grew Klebsiella. However the antibiotic that I chose for the patient, the bacteria was resistant to. I wanted to switch him to Bactrim however he was then hospitalized for a stroke requiring TPA. He is here today to follow-up to ensure that the urinary tract infection is not still present. He is completely asymptomatic. He denies any dysuria urgency or frequency or hematuria  Past Medical History:  Diagnosis Date  . Arthritis    s/p TKR  . Bladder cancer (HCC)    [facility] low grade papillary urothileal carcinoma  . BPH (benign prostatic hyperplasia)   . Brugada syndrome    Possible Type II Brugada ECG pattern. No family history of SCD, no syncope, no tachypalpitations.  Marland Kitchen CAD (coronary artery disease)    a. LHC 1/15 - mid LCx 50 and 60%, proximal RCA 50%  . Chronic diastolic CHF (congestive heart failure) ([place])   . CKD (chronic kidney disease)   . COPD (chronic obstructive pulmonary disease) ([place])    Prior heavy smoker. PFTs (3/11): FVC 87%, FEV1 73%, ratio 0.57, DLCO 75%, TLC 121%. Moderate obstructive defect. PFTs (9/15): Only minimal obstruction, sugPrior heavy smoker. PFTs (3/11): FVC 87%, FEV1 73%, ratio 0.57, DLCO 75%, TLC 121%. Moderate obstructive defect. PFTs (9/15) minimal obstruction, poss asthma component   . Depression    with bipolar tendencies  . DOE (dyspnea on exertion)    a. Myoview 8/09- EF 57%, no ischemia // b. Myoview (3/11) EF 68%, diaphragmatic attenuation, no ischemia //  c Echo (4/11) EF 98-33%, mild diastolic dysfunction, PASP 38 mmHg // d. PFTs 9/15 minimal obstruction  /  e. CT negative for ILD  . GERD (gastroesophageal reflux disease)   . History of Doppler  ultrasound    Carotid US 3/11 negative for significant stenosis  //  carotid US 6/17: Mild bilateral plaque, 1-39% ICA  . History of echocardiogram    a. Echo 12/14 Inferior and distal septal HK, mild LVH, EF 50-55%, mild MR, mild LAE  //  b. Echo 6/17: Mild focal basal septal hypertrophy, EF 60-65%, normal wall motion, grade 1 diastolic dysfunction, mild AI  . HLD (hyperlipidemia)   . Hypertensive heart disease with CHF (congestive heart failure) ([place]) 04/08/2009  . Low testosterone   . Lung nodule    a. CT in 2015 >> PET in 12/15 not sugg of malignancy   . LVH (left ventricular hypertrophy)    a. Echo 12/14 Inferior and distal septal HK, mild LVH, EF 50-55%, mild MR, mild LAE  . Mild dementia   . Mitral regurgitation    Echo (4/11) with PISA ERO 0.3 cm^2 and regurgitant volume 41 mL (moderate MR). Echo (12/14) with mild MR.   . MS (multiple sclerosis) ([place])   . MS (multiple sclerosis) ([place])   . Right bundle branch block   . Stroke ([place])   . Thoracic aortic aneurysm (HCC)    4.1 cm 2017  . Urinary retention with incomplete bladder emptying    receives botox injections and treatment for BPH through [facility]   Past Surgical History:  Procedure Laterality Date  . APPENDECTOMY    . EP IMPLANTABLE DEVICE N/A 06/23/2015   Procedure: Loop Recorder  Insertion;  Surgeon: Deboraha Sprang, MD;  Location: [facility];  Service: Cardiovascular;  Laterality: N/A;  . HEMORRHOID SURGERY    . LEFT HEART CATHETERIZATION WITH CORONARY ANGIOGRAM N/A 01/05/2013   Procedure: LEFT HEART CATHETERIZATION WITH CORONARY ANGIOGRAM;  Surgeon: Larey Dresser, MD;  Location: [facility] CATH LAB;  Service: Cardiovascular;  Laterality: N/A;  . ROTATOR CUFF REPAIR    . TEE WITHOUT CARDIOVERSION N/A 06/23/2015   Procedure: TRANSESOPHAGEAL ECHOCARDIOGRAM (TEE);  Surgeon: Fay Records, MD;  Location: [facility] ENDOSCOPY;  Service: Cardiovascular;  Laterality: N/A;  . TONSILLECTOMY    . TOTAL KNEE ARTHROPLASTY Right   .  TRANSURETHRAL RESECTION OF PROSTATE     Current Outpatient Prescriptions on File Prior to Visit  Medication Sig Dispense Refill  . acetaminophen (TYLENOL) 325 MG tablet Take 650 mg by mouth every 6 (six) hours as needed (for pain or headaches).     Marland Kitchen amantadine (SYMMETREL) 100 MG capsule TAKE 1 CAPSULE BY MOUTH TWICE DAILY WITH BREAKFAST AND LUNCH 180 capsule 2  . amLODipine (NORVASC) 10 MG tablet Take 1 tablet (10 mg total) by mouth daily. 90 tablet 3  . atorvastatin (LIPITOR) 20 MG tablet TAKE 1 TABLET DAILY 90 tablet 3  . bisoprolol (ZEBETA) 10 MG tablet Take 1 tablet (10 mg total) by mouth daily. 30 tablet 5  . cholecalciferol (VITAMIN D) 400 units TABS tablet Take 400 Units by mouth daily.     . Cyanocobalamin (B-12) 1000 MCG CAPS Take 1,000 mcg by mouth daily.     Marland Kitchen dipyridamole-aspirin (AGGRENOX) 200-25 MG 12hr capsule Take 1 capsule by mouth 2 (two) times daily. 180 capsule 1  . doxazosin (CARDURA) 2 MG tablet Take 1 tablet (2 mg total) by mouth daily. 30 tablet 2  . folic acid (FOLVITE) 1 MG tablet Take 1 mg by mouth daily.    . furosemide (LASIX) 40 MG tablet Take 40 mg by mouth daily.    Marland Kitchen HYDROcodone-acetaminophen (NORCO) 10-325 MG tablet Take 1 tablet by mouth every 6 (six) hours as needed for moderate pain or severe pain. 100 tablet 0  . losartan (COZAAR) 50 MG tablet Take 100 mg by mouth daily.     Marland Kitchen MAGNESIUM CITRATE PO Take 0.5-1 Bottles by mouth once as needed (for mild constipation).     . pantoprazole (PROTONIX) 40 MG tablet Take 1 tablet (40 mg total) by mouth daily. 90 tablet 3  . PROAIR HFA 108 (90 Base) MCG/ACT inhaler USE 1 TO 2 INHALATIONS EVERY 6 HOURS AS NEEDED FOR WHEEZING OR SHORTNESS OF BREATH 25.5 g 4  . rOPINIRole (REQUIP) 1 MG tablet TAKE 1 TABLET AT BEDTIME 90 tablet 3  . SPIRIVA HANDIHALER 18 MCG inhalation capsule INHALE THE CONTENTS OF 1 CAPSULE WITH 2         INHALATIONS ONCE DAILY AS DIRECTED 1 capsule 11  . venlafaxine XR (EFFEXOR-XR) 150 MG 24 hr  capsule TAKE 1 CAPSULE TWICE A DAY 180 capsule 3   No current facility-administered medications on file prior to visit.    Allergies  Allergen Reactions  . Acetylcholine     Patient was suspected of having "Brugada Syndrome": (BrS) is a genetic condition that results in abnormal electrical activity within the heart, increasing the risk of sudden cardiac death. Those affected may have episodes of passing out.  Marland Kitchen Alcohol-Sulfur [Sulfur] Other (See Comments)    Patient was suspected of having "Brugada Syndrome"  . Amitriptyline Other (See Comments)    Patient was  suspected of having "Brugada Syndrome"  . Bupivacaine Other (See Comments)    Patient was suspected of having "Brugada Syndrome"  . Clomipramine Hcl Other (See Comments)    Patient was suspected of having "Brugada Syndrome"  . Cocaine Other (See Comments)    Patient was suspected of having "Brugada Syndrome"  . Desipramine Other (See Comments)    Patient was suspected of having "Brugada Syndrome"  . Ergonovine Other (See Comments)    Patient was suspected of having "Brugada Syndrome"  . Flecainide Other (See Comments)    Patient was suspected of having "Brugada Syndrome"  . Lithium Other (See Comments)    Patient was suspected of having "Brugada Syndrome"  . Loxapine Other (See Comments)    Patient was suspected of having "Brugada Syndrome"  . Nortriptyline Other (See Comments)    Patient was suspected of having "Brugada Syndrome"  . Oxcarbazepine Other (See Comments)    Patient was suspected of having "Brugada Syndrome"  . Procainamide Other (See Comments)    Patient was suspected of having "Brugada Syndrome"  . Procaine Other (See Comments)    Patient was suspected of having "Brugada Syndrome"  . Propafenone Other (See Comments)    Patient was suspected of having "Brugada Syndrome"  . Propofol Other (See Comments)    Patient was suspected of having "Brugada Syndrome"  . Trifluoperazine Other (See Comments)    Patient  was suspected of having "Brugada Syndrome"   Social History   Social History  . Marital status: Married    Spouse name: N/A  . Number of children: N/A  . Years of education: N/A   Occupational History  . retired Retired   Social History Main Topics  . Smoking status: Former Smoker    Packs/day: 2.00    Years: 40.00    Types: Cigarettes    Quit date: 01/04/1993  . Smokeless tobacco: Former Systems developer    Types: Chew     Comment: patient still smokes a pipe daily  . Alcohol use 15.0 oz/week    25 Cans of beer per week     Comment: 6 pack/day - 06/20/15 Pt states wuit drinking in the 1970's  . Drug use: No  . Sexual activity: Not on file   Other Topics Concern  . Not on file   Social History Narrative   Retired from the [employer] after 20 years   [place] Veteran      Review of Systems  All other systems reviewed and are negative.      Objective:   Physical Exam  Constitutional: He is oriented to person, place, and time.  Cardiovascular: Normal rate, regular rhythm and normal heart sounds.   Pulmonary/Chest: Effort normal and breath sounds normal. No respiratory distress. He has no wheezes. He has no rales.  Abdominal: Soft. Bowel sounds are normal.  Neurological: He is alert and oriented to person, place, and time. He has normal reflexes. No cranial nerve deficit. He exhibits normal muscle tone. Coordination normal.  Psychiatric: He has a normal mood and affect. His speech is normal and behavior is normal. Thought content normal. He is not slowed. Cognition and memory are normal.  Vitals reviewed.         Assessment & Plan:  Urinary tract infection without hematuria, site unspecified - Plan: Urinalysis, Routine w reflex microscopic Repeat urine culture. Await sensitivities. If bacteria still present, we will treat with a sensitive agent for a prolonged course 7-10 days and repeat a urine culture after antibiotic  treatment. Patient may also be colonized

## 2016-04-28 ENCOUNTER — Ambulatory Visit (INDEPENDENT_AMBULATORY_CARE_PROVIDER_SITE_OTHER)
Admission: RE | Admit: 2016-04-28 | Discharge: 2016-04-28 | Disposition: A | Discharge status: Home / Self Care | Payer: Medicare Other | Source: Ambulatory Visit | Attending: Physician Assistant | Admitting: Physician Assistant

## 2016-04-28 DIAGNOSIS — I712 Thoracic aortic aneurysm, without rupture, unspecified: Secondary | ICD-10-CM

## 2016-04-28 LAB — CUP PACEART REMOTE DEVICE CHECK
Date Time Interrogation Session: 20180420193641
MDC IDC PG IMPLANT DT: 20170619

## 2016-04-28 MED ORDER — IOPAMIDOL (ISOVUE-370) INJECTION 76%
100.0000 mL | Freq: Once | INTRAVENOUS | Status: AC | PRN
  Administered 2016-04-28: 100 mL via INTRAVENOUS

## 2016-04-29 ENCOUNTER — Other Ambulatory Visit: Payer: Self-pay

## 2016-04-29 ENCOUNTER — Telehealth: Payer: Self-pay | Admitting: Neurology

## 2016-04-29 ENCOUNTER — Telehealth: Payer: Self-pay | Admitting: Family Medicine

## 2016-04-29 DIAGNOSIS — E785 Hyperlipidemia, unspecified: Secondary | ICD-10-CM

## 2016-04-29 MED ORDER — ATORVASTATIN CALCIUM 40 MG PO TABS: 40.0000 mg | ORAL_TABLET | Freq: Every day | ORAL | 3 refills | Status: DC

## 2016-04-29 NOTE — Telephone Encounter (Signed)
DR.Xu  Please advise me I can call the patient back.

## 2016-04-29 NOTE — Telephone Encounter (Signed)
LMTRC

## 2016-04-29 NOTE — Telephone Encounter (Signed)
I reviewed chart that cardiology has done a CT chest and found him to have aortic atherosclerosis which may be the reason for his recent embolic stroke without afib, which is very possible. He is on aggrenox and lipitor. Cardiology will increase lipitor to 40mg , and I would consider to start dual antiplatelet for him to give him maximized medical therapy. I will discuss with him at next appointment 05/17/16.   Hi, Katrina, please let the pt know that we may need to add on another medication to treat his stroke but will discuss with him on 05/17/16. Thanks   Rosalin Hawking, MD PhD Stroke Neurology 04/29/2016 7:27 PM

## 2016-04-29 NOTE — Telephone Encounter (Signed)
Called pt back and nobody pick up the phone. Left VM for pt to call back.   If pt calls back, please ask why cardiologist would like to change aggrenox. We are not sure about the MS diagnosis and it is unlikely that pt will have MS flare at this time. His weakness may be related to his recent UTI.   Rosalin Hawking, MD PhD Stroke Neurology 04/29/2016 6:57 PM

## 2016-04-29 NOTE — Telephone Encounter (Signed)
Pt's wife called said pt has MS and seems to be getting weaker. Pt saw cardiologist a 1 week ago, she said he wants to know if provider wants to make any changes to dipyridamole-aspirin (AGGRENOX) 200-25 MG 12hr capsule . Please call

## 2016-04-29 NOTE — Telephone Encounter (Signed)
[place] patients wife calling because she feels like she needs to discuss a report that came in regarding her husband  Would like to speak to someone today if possible  [phone]

## 2016-04-30 ENCOUNTER — Telehealth: Payer: Self-pay | Admitting: Physician Assistant

## 2016-04-30 LAB — URINE CULTURE

## 2016-04-30 NOTE — Telephone Encounter (Signed)
Rn call patients wife on dpr form. Rn stated Dr. Erlinda Hong left a message last night. PTs wife she went to bed early last night and got the message this am. Rn stated the cardiologist increase the lipitor to 40mg  as of 04/29/2016. PTs wife was aware of the increase of liptor to 40mg  and will start that. Rn stated pt has an appt on 05/17/2016 to discuss any antiplatelet issues. Rn stated per Dr. Erlinda Hong the weakness may be related to the UTI he has had not the MS.Pts wife stated the aspirin aggrenox was increase to 2 tablets twice a day on 04/20/2016. Rn stated the discharge papers and rx state one tablet twice a day as of 04/20/2016. Rn stated does she have the paperwork stating 2 tablets of aspirin/aggrenox twice a day.Patient wifel call back once she finds the discharge papers.

## 2016-04-30 NOTE — Telephone Encounter (Addendum)
Left message for patient regarding question from yesterday. The CT was reviewed with [name] and I have asked his neurologist to review to see if he would recommend doing anything differently for stroke prevention. Dr. Erlinda Hong did reply to me and does not recommend anticoagulation (ie coumadin or Xarelto).  He may want to put him on another antiplatelet medication like Plavix in addition to the Aggrenox when he sees him for follow up.  Dr. Erlinda Hong will discuss with him at that appointment.   The aneurysm is smaller than 2017.  We could wait for 2 years to repeat the CT. But, ok to schedule again in 1 year if patient would rather do this. There is no indication for surgery at this point for the aneurysm and no need to see thoracic surgery. If patient would rather see the surgeon anyway, ok to refer to TCTS. Richardson Dopp, PA-C    04/30/2016 1:52 PM

## 2016-04-30 NOTE — Telephone Encounter (Signed)
I tried to call pt and his wife back though no answer or machine to lmom. Pt returning call from [place], [place] in regards to question about pt's CT results that were reviewed yesterday.

## 2016-04-30 NOTE — Telephone Encounter (Signed)
Dr Erlinda Hong can you clarify patients aspirin/aggrenox? Pts wife she was told 2 capsules by mouth twice a day. The rx states one capsule twice a day.

## 2016-04-30 NOTE — Telephone Encounter (Signed)
New message    Pt wife is returning call to [place].

## 2016-05-03 ENCOUNTER — Telehealth: Payer: Self-pay | Admitting: Family Medicine

## 2016-05-03 ENCOUNTER — Telehealth: Payer: Self-pay | Admitting: Internal Medicine

## 2016-05-03 MED ORDER — HYDROCODONE-ACETAMINOPHEN 10-325 MG PO TABS: 1.0000 | ORAL_TABLET | Freq: Four times a day (QID) | ORAL | 0 refills | Status: DC | PRN

## 2016-05-03 MED ORDER — LEVOFLOXACIN 250 MG PO TABS: 250.0000 mg | ORAL_TABLET | Freq: Every day | ORAL | 0 refills | Status: DC

## 2016-05-03 NOTE — Telephone Encounter (Signed)
ok 

## 2016-05-03 NOTE — Telephone Encounter (Signed)
RX printed, left up front and patient aware to pick up  

## 2016-05-03 NOTE — Telephone Encounter (Signed)
The aggrenox should be one capsule twice a day. Please let pt know, take one capsule twice a day and I will see him on 05/17/16 to discuss about further regimen. Thanks  Rosalin Hawking, MD PhD Stroke Neurology 05/03/2016 9:19 AM

## 2016-05-03 NOTE — Telephone Encounter (Signed)
Patient needs rx for his hydrocodone [phone] (H)

## 2016-05-03 NOTE — Telephone Encounter (Signed)
Pt's wife requesting asap appt with MR for pt as he is having gradual worsening SOB.   MR has opening tomorrow on his clinic schedule that was not offered to pt and wife when they called in. Offered pt the open appt time, which they accepted.  Will evaluate pt in clinic tomorrow (05/04/16) at 12:00.  Nothing further needed.

## 2016-05-03 NOTE — Telephone Encounter (Signed)
Ok to refill 

## 2016-05-03 NOTE — Telephone Encounter (Signed)
RN call patients wife about the aspirin/aggrenox dosage. Rn stated per Dr. Erlinda Hong he wants pt to only take one capsule twice a day until his appt 05/17/2016. Pts wife verbalized understanding.

## 2016-05-03 NOTE — Telephone Encounter (Signed)
I reached pt's wife (DPR) to to go over concern about repeat CT 2 yrs.. See note from [place] 04/30/16 where he tried to reach pt to talk to pt and his wife about their concern of waiting 2 yrs to repeat Chest CT. When pt's wife answered the phone a few minutes ago she asked if she could call back. I did advise her that the phone lines do cut off at 5 pm and it was now 4:53. Mrs. Cloninger then said to me that they have appt with Dr. Saunders Revel on 5/3 and that appt should answer their questions. Mrs. Braman did thank me for my call today and our help.

## 2016-05-03 NOTE — Telephone Encounter (Signed)
Pt aware of UC results and med sent to pharm

## 2016-05-04 ENCOUNTER — Ambulatory Visit (INDEPENDENT_AMBULATORY_CARE_PROVIDER_SITE_OTHER): Payer: Medicare Other | Admitting: Internal Medicine

## 2016-05-04 ENCOUNTER — Encounter: Payer: Self-pay | Admitting: Internal Medicine

## 2016-05-04 DIAGNOSIS — R0602 Shortness of breath: Secondary | ICD-10-CM

## 2016-05-04 DIAGNOSIS — I639 Cerebral infarction, unspecified: Secondary | ICD-10-CM | POA: Diagnosis not present

## 2016-05-04 NOTE — Progress Notes (Signed)
Subjective:     Patient ID: [name] [name], male   DOB: 08-Mar-1940, 76 y.o.   MRN: [id number]  HPI    HPI  OV 04/03/2015  Chief Complaint  Patient presents with  . Follow-up    Pt states his breathing is doing well. Pt denies SOB, cough, wheezing, CP/tightness.     Follow-up mild to moderate COPD. He has multiple medical problems. Last seen by my nurse practitioner several months ago. He also has right middle lobe nodule which she follows up 6 mm present since summer 2015. Most recent CT chest August 2016 show stability of the nodule. Currently he denies any symptoms. He says he only uses albuterol as needed. He does not use his Spiriva. He does not have any nocturnal symptoms. No limitations with activities of daily living. He has good functional status. No shortness of breath or wheezing or cough. He is only limited by his visceral obesity. Otherwise feels well. Up-to-date with flu shot and    OV 08/26/2015  Chief Complaint  Patient presents with  . Follow-up    Pt states his breathing is unchanged since last OV, SOB is at baseline. Pt c/o prod cough with white mucus. Pt denies CP/tightness and f/c/s.     Follow-up Gold stage II COPD based on 2015 PFTs showing FEV1 2.42 L/77% and ratio 72 with a DLCO of 21.06 and 66%.  - Overall he reports stable health. Last seen in spring 2017. In June 2017 he did have an echocardiogram that shows ejection fraction 65% and grade 1 diastolic dysfunction but otherwise fine. He reports stable health on Spiriva but looking at Score gets significant exertional dyspnea and fatigue. Walking desaturation test today in the office 185 feet 3 laps on room air: He did not desaturate below 94% got tachycardic to 103 and got very dyspneic. No chest pains no cough. No wheeze or hemoptysis. Denies any coronary artery disease history. Review of the chart shows in 2015 he had nonobstructive disease by cardiac catheterization. He was last seen by cardiology 2017 June.  His CT chest did show thoracic Ira aneurysm but I'm not so sure cardiology is aware of this was in the   Follow-up lung nodules: He had in June 2017 CT chest that shows stability of lung nodules for 2 years. He is eligible for lung cancer low-dose screening next year summer 2018  Off note: Lab review shows a creatinine bump from 1.32 mg percent but his baseline is fluctuate anywhere from 0.91.6 milligrams percent; all of this was in June 2017.  IMPRESSION: 1. The nodules are stable from 06/26/2013. More spiculated nodules inferomedially in the right middle lobe were not previously positive on PET scan and accordingly are likely benign. The 5 by 6 mm nodule is also stable over the past 2 years. This 2 year stability is compatible with benign process, and based on current guidelines, further follow up surveillance is not required. 2. 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 Recommend annual imaging followup by CTA or MRA. This recommendation follows 2010 ACCF/AHA/AATS/ACR/ASA/SCA/SCAI/SIR/STS/SVM Guidelines for the Diagnosis and Management of Patients with Thoracic Aortic Disease. Circulation. 2010; 121: Q119-E174 3. Coronary, aortic arch, and branch vessel atherosclerotic vascular disease.   Electronically Signed   By: Van Clines M.D.   On: 06/05/2015 17:00    OV 05/04/2016  Chief Complaint  Patient presents with  . Acute Visit    Pt c/o increase in SOB x 6 months. Pt c/o intermittent dry cough. Pt  denies f/c/s, chest congestion, CP/tihgtness.    Known Gold stage II COPD based on 2015 6 PFT.  Alpha 19 MM  76 year old male labeled as an acute visit. However he is here with his wife and they both clearly attest that he's having worsening shortness of breath for the last 6 months. He is been diagnosed with multiple sclerosis and in the last 4-5 months he's had increasing falls. He finished pulmonary rehabilitation January 2018 but dyspnea is progressing despite that.  Currently described as classic 3 on exertion relieved by rest. He is unable to go to the mailbox and coming back. He is clearly frustrated. There is no change in his cough or sputum production or fever. He did have a CT chest April 2018 that he did not describe any lung parenchymal abnormalities   CAT COPD Symptom & Quality of Life Score ([place] trademark) 0 is no burden. 5 is highest burden 08/26/2015   Never Cough -> Cough all the time 3  No phlegm in chest -> Chest is full of phlegm 2  No chest tightness -> Chest feels very tight 0  No dyspnea for 1 flight stairs/hill -> Very dyspneic for 1 flight of stairs 5  No limitations for ADL at home -> Very limited with ADL at home 4  Confident leaving home -> Not at all confident leaving home 2  Sleep soundly -> Do not sleep soundly because of lung condition 0  Lots of Energy -> No energy at all 5  TOTAL Score (max 40)  21     IMPRESSION: 1. Stable fusiform dilatation of the ascending aorta measuring 4.0 cm in maximum diameter. Recommend annual imaging followup by CTA or MRA. This recommendation follows 2010 ACCF/AHA/AATS/ACR/ASA/SCA/SCAI/SIR/STS/SVM Guidelines for the Diagnosis and Management of Patients with Thoracic Aortic Disease. Circulation. 2010; 121: H702-O378. 2. Moderate thoracic aortic atherosclerosis. 3. Coronary artery calcifications. 4. Stable pulmonary nodules on the right consistent with prior inflammation and probable subsequent scarring. No new or enlarging pulmonary nodule is seen.   Electronically Signed   By: Ivar Drape M.D.   On: 04/28/2016 15:05   has a past medical history of Arthritis; Bladder cancer ([place]); BPH (benign prostatic hyperplasia); Brugada syndrome; CAD (coronary artery disease); Chronic diastolic CHF (congestive heart failure) (HCC); CKD (chronic kidney disease); COPD (chronic obstructive pulmonary disease) ([place]); Depression; DOE (dyspnea on exertion); GERD (gastroesophageal reflux disease); History  of Doppler ultrasound; History of echocardiogram; HLD (hyperlipidemia); Hypertensive heart disease with CHF (congestive heart failure) ([place]) (04/08/2009); Low testosterone; Lung nodule; LVH (left ventricular hypertrophy); Mild dementia; Mitral regurgitation; MS (multiple sclerosis) ([place]); MS (multiple sclerosis) ([place]); Right bundle branch block; Stroke ([place]); Thoracic aortic aneurysm ([place]); and Urinary retention with incomplete bladder emptying.   reports that he quit smoking about 23 years ago. His smoking use included Cigarettes. He has a 80.00 pack-year smoking history. He has quit using smokeless tobacco. His smokeless tobacco use included Chew.  Past Surgical History:  Procedure Laterality Date  . APPENDECTOMY    . EP IMPLANTABLE DEVICE N/A 06/23/2015   Procedure: Loop Recorder Insertion;  Surgeon: Deboraha Sprang, MD;  Location: [facility];  Service: Cardiovascular;  Laterality: N/A;  . HEMORRHOID SURGERY    . LEFT HEART CATHETERIZATION WITH CORONARY ANGIOGRAM N/A 01/05/2013   Procedure: LEFT HEART CATHETERIZATION WITH CORONARY ANGIOGRAM;  Surgeon: Larey Dresser, MD;  Location: [facility] CATH LAB;  Service: Cardiovascular;  Laterality: N/A;  . ROTATOR CUFF REPAIR    . TEE WITHOUT CARDIOVERSION N/A 06/23/2015  Procedure: TRANSESOPHAGEAL ECHOCARDIOGRAM (TEE);  Surgeon: Fay Records, MD;  Location: [facility] ENDOSCOPY;  Service: Cardiovascular;  Laterality: N/A;  . TONSILLECTOMY    . TOTAL KNEE ARTHROPLASTY Right   . TRANSURETHRAL RESECTION OF PROSTATE      Allergies  Allergen Reactions  . Acetylcholine     Patient was suspected of having "Brugada Syndrome": (BrS) is a genetic condition that results in abnormal electrical activity within the heart, increasing the risk of sudden cardiac death. Those affected may have episodes of passing out.  Marland Kitchen Alcohol-Sulfur [Sulfur] Other (See Comments)    Patient was suspected of having "Brugada Syndrome"  . Amitriptyline Other (See Comments)    Patient was  suspected of having "Brugada Syndrome"  . Bupivacaine Other (See Comments)    Patient was suspected of having "Brugada Syndrome"  . Clomipramine Hcl Other (See Comments)    Patient was suspected of having "Brugada Syndrome"  . Cocaine Other (See Comments)    Patient was suspected of having "Brugada Syndrome"  . Desipramine Other (See Comments)    Patient was suspected of having "Brugada Syndrome"  . Ergonovine Other (See Comments)    Patient was suspected of having "Brugada Syndrome"  . Flecainide Other (See Comments)    Patient was suspected of having "Brugada Syndrome"  . Lithium Other (See Comments)    Patient was suspected of having "Brugada Syndrome"  . Loxapine Other (See Comments)    Patient was suspected of having "Brugada Syndrome"  . Nortriptyline Other (See Comments)    Patient was suspected of having "Brugada Syndrome"  . Oxcarbazepine Other (See Comments)    Patient was suspected of having "Brugada Syndrome"  . Procainamide Other (See Comments)    Patient was suspected of having "Brugada Syndrome"  . Procaine Other (See Comments)    Patient was suspected of having "Brugada Syndrome"  . Propafenone Other (See Comments)    Patient was suspected of having "Brugada Syndrome"  . Propofol Other (See Comments)    Patient was suspected of having "Brugada Syndrome"  . Trifluoperazine Other (See Comments)    Patient was suspected of having "Brugada Syndrome"    Immunization History  Administered Date(s) Administered  . Influenza Whole 11/07/2006, 09/30/2008  . Influenza, High Dose Seasonal PF 10/17/2012  . Influenza,inj,Quad PF,36+ Mos 09/04/2013, 11/18/2014, 09/23/2015  . Pneumococcal Conjugate-13 10/19/2011, 08/06/2014  . Pneumococcal Polysaccharide-23 09/08/2006, 11/07/2006  . Td 11/07/2006  . Tdap 07/04/2013  . Zoster 11/20/2009    Family History  Problem Relation Age of Onset  . Stroke Father   . Stroke Mother   . Hypertension Sister   . Kidney cancer Brother    . Arthritis Brother   . Other Brother     knee problems, Bil TKR  . Hypertension      family history     Current Outpatient Prescriptions:  .  acetaminophen (TYLENOL) 325 MG tablet, Take 650 mg by mouth every 6 (six) hours as needed (for pain or headaches). , Disp: , Rfl:  .  amantadine (SYMMETREL) 100 MG capsule, TAKE 1 CAPSULE BY MOUTH TWICE DAILY WITH BREAKFAST AND LUNCH, Disp: 180 capsule, Rfl: 2 .  amLODipine (NORVASC) 10 MG tablet, Take 1 tablet (10 mg total) by mouth daily., Disp: 90 tablet, Rfl: 3 .  atorvastatin (LIPITOR) 40 MG tablet, Take 1 tablet (40 mg total) by mouth daily., Disp: 90 tablet, Rfl: 3 .  bisoprolol (ZEBETA) 10 MG tablet, Take 1 tablet (10 mg total) by mouth daily., Disp: 30 tablet, Rfl:  5 .  cholecalciferol (VITAMIN D) 400 units TABS tablet, Take 400 Units by mouth daily. , Disp: , Rfl:  .  Cyanocobalamin (B-12) 1000 MCG CAPS, Take 1,000 mcg by mouth daily. , Disp: , Rfl:  .  dipyridamole-aspirin (AGGRENOX) 200-25 MG 12hr capsule, Take 1 capsule by mouth 2 (two) times daily., Disp: 180 capsule, Rfl: 1 .  doxazosin (CARDURA) 2 MG tablet, Take 1 tablet (2 mg total) by mouth daily., Disp: 30 tablet, Rfl: 2 .  folic acid (FOLVITE) 1 MG tablet, Take 1 mg by mouth daily., Disp: , Rfl:  .  furosemide (LASIX) 40 MG tablet, Take 40 mg by mouth daily., Disp: , Rfl:  .  HYDROcodone-acetaminophen (NORCO) 10-325 MG tablet, Take 1 tablet by mouth every 6 (six) hours as needed for moderate pain or severe pain., Disp: 100 tablet, Rfl: 0 .  losartan (COZAAR) 50 MG tablet, Take 100 mg by mouth daily. , Disp: , Rfl:  .  MAGNESIUM CITRATE PO, Take 0.5-1 Bottles by mouth once as needed (for mild constipation). , Disp: , Rfl:  .  pantoprazole (PROTONIX) 40 MG tablet, Take 1 tablet (40 mg total) by mouth daily., Disp: 90 tablet, Rfl: 3 .  PROAIR HFA 108 (90 Base) MCG/ACT inhaler, USE 1 TO 2 INHALATIONS EVERY 6 HOURS AS NEEDED FOR WHEEZING OR SHORTNESS OF BREATH, Disp: 25.5 g, Rfl:  4 .  rOPINIRole (REQUIP) 1 MG tablet, TAKE 1 TABLET AT BEDTIME, Disp: 90 tablet, Rfl: 3 .  SPIRIVA HANDIHALER 18 MCG inhalation capsule, INHALE THE CONTENTS OF 1 CAPSULE WITH 2         INHALATIONS ONCE DAILY AS DIRECTED, Disp: 1 capsule, Rfl: 11 .  venlafaxine XR (EFFEXOR-XR) 150 MG 24 hr capsule, TAKE 1 CAPSULE TWICE A DAY, Disp: 180 capsule, Rfl: 3 .  levofloxacin (LEVAQUIN) 250 MG tablet, Take 1 tablet (250 mg total) by mouth daily. (Patient not taking: Reported on 05/04/2016), Disp: 7 tablet, Rfl: 0    Review of Systems     Objective:   Physical Exam  Constitutional: He is oriented to person, place, and time. He appears well-developed and well-nourished. No distress.  HENT:  Head: Normocephalic and atraumatic.  Right Ear: External ear normal.  Left Ear: External ear normal.  Mouth/Throat: Oropharynx is clear and moist. No oropharyngeal exudate.  Eyes: Conjunctivae and EOM are normal. Pupils are equal, round, and reactive to light. Right eye exhibits no discharge. Left eye exhibits no discharge. No scleral icterus.  Neck: Normal range of motion. Neck supple. No JVD present. No tracheal deviation present. No thyromegaly present.  Cardiovascular: Normal rate, regular rhythm and intact distal pulses.  Exam reveals no gallop and no friction rub.   No murmur heard. Pulmonary/Chest: Effort normal and breath sounds normal. No respiratory distress. He has no wheezes. He has no rales. He exhibits no tenderness.  Abdominal: Soft. Bowel sounds are normal. He exhibits no distension and no mass. There is no tenderness. There is no rebound and no guarding.  Significant visceral obeist +  Musculoskeletal: Normal range of motion. He exhibits no edema or tenderness.  Abrasion both knee from fall  Lymphadenopathy:    He has no cervical adenopathy.  Neurological: He is alert and oriented to person, place, and time. He has normal reflexes. No cranial nerve deficit. Coordination normal.  Skin: Skin is  warm and dry. No rash noted. He is not diaphoretic. No erythema. No pallor.  Psychiatric: Judgment and thought content normal.  Flat affect  Nursing  note and vitals reviewed.  Vitals:   05/04/16 1245  BP: 116/60  Pulse: 68  SpO2: 97%  Weight: 214 lb 6.4 oz (97.3 kg)  Height: 5\' 10"  (1.778 m)    Estimated body mass index is 30.76 kg/m as calculated from the following:   Height as of this encounter: 5\' 10"  (1.778 m).   Weight as of this encounter: 214 lb 6.4 oz (97.3 kg).     Assessment:       ICD-9-CM ICD-10-CM   1. Shortness of breath 786.05 R06.02 Pulse oximetry, overnight     Pulmonary Function Test       Plan:     SHORTNESS OF BREATH Progressive and unclear why  Plan - ONO test on room air next few weeks  - full PFT next few weeks  Followup - regroup with myself or an APP after above tests      Dr. Brand Males, M.D., [facility].C.P Pulmonary and Critical Care Medicine Staff Physician [facility] Pager: [phone], If no answer or between  15:00h - 7:00h: call [phone]  05/04/2016 1:02 PM

## 2016-05-04 NOTE — Patient Instructions (Signed)
SHORTNESS OF BREATH Progressive and unclear why  Plan - ONO test on room air next few weeks  - full PFT next few weeks  Followup - regroup with myself or an APP after above tests

## 2016-05-04 NOTE — Assessment & Plan Note (Signed)
Progressive and unclear why  Plan - ONO test on room air next few weeks  - full PFT next few weeks  Followup - regroup with myself or an APP after above tests

## 2016-05-05 ENCOUNTER — Ambulatory Visit (INDEPENDENT_AMBULATORY_CARE_PROVIDER_SITE_OTHER): Payer: Medicare Other | Admitting: Internal Medicine

## 2016-05-05 DIAGNOSIS — R0602 Shortness of breath: Secondary | ICD-10-CM

## 2016-05-05 NOTE — Telephone Encounter (Signed)
Thank you Dr. Saunders Revel for your help.

## 2016-05-05 NOTE — Progress Notes (Signed)
PFT done today. 

## 2016-05-05 NOTE — Telephone Encounter (Signed)
Will discuss with Mr. Scoggins at [place] appointment. Thanks.  Nelva Bush, MD [facility] Pager: [phone]

## 2016-05-06 ENCOUNTER — Encounter: Payer: Self-pay | Admitting: Internal Medicine

## 2016-05-06 ENCOUNTER — Ambulatory Visit (INDEPENDENT_AMBULATORY_CARE_PROVIDER_SITE_OTHER): Payer: Medicare Other | Admitting: Internal Medicine

## 2016-05-06 VITALS — BP 104/60 | HR 63 | Ht 70.0 in | Wt 219.0 lb

## 2016-05-06 DIAGNOSIS — R0602 Shortness of breath: Secondary | ICD-10-CM

## 2016-05-06 DIAGNOSIS — I712 Thoracic aortic aneurysm, without rupture, unspecified: Secondary | ICD-10-CM

## 2016-05-06 DIAGNOSIS — Z8673 Personal history of transient ischemic attack (TIA), and cerebral infarction without residual deficits: Secondary | ICD-10-CM | POA: Diagnosis not present

## 2016-05-06 DIAGNOSIS — I2510 Atherosclerotic heart disease of native coronary artery without angina pectoris: Secondary | ICD-10-CM | POA: Diagnosis not present

## 2016-05-06 NOTE — Patient Instructions (Signed)
Medication Instructions:  Your physician recommends that you continue on your current medications as directed. Please refer to the Current Medication list given to you today.   Labwork: None   Testing/Procedures: None   Follow-Up: Your physician recommends that you schedule a follow-up appointment in: 2 months with Richardson Dopp, PA,c        If you need a refill on your cardiac medications before your next appointment, please call your pharmacy.

## 2016-05-06 NOTE — Progress Notes (Signed)
Cardiology Office Note:    Date:  05/08/2016   ID:  [name] [name], DOB [birth year], MRN [id number]  PCP:  [name] Frizzle, MD  Cardiologist:  Dr. Harrell Gave [name] [name]  Electrophysiologist:  N/a Pulmonology: Dr. Chase Caller Neurology: Dr. Erlinda Hong  Referring MD: [name] Frizzle, MD   No chief complaint on file.   History of Present Illness:    [name] [name] is a 76 y.o. male with a hx of mod non-obstructive CAD on LHC in 3/41, diastolic HF, HTN, HL, GERD, COPD, prior stroke, multiple sclerosis, AKI assoc with NSAID use. He has struggled with bipolar depression.   Admitted 6/17 with a R MCA distribution stroke. TEE demonstrated no PFO, no LAA clot and normal thoracic aorta. He had a Linq ILR implanted by Dr. Caryl [name].   He returns for follow-up a few weeks ago and complained of worsening shortness of breath. BNP was somewhat elevated. I adjusted his Lasix.  He was admitted again 9/3-07/12/00 with an embolic CVA (right cerebellar and right occipital lobes) treated with IV tPA. Neck CT was notable for severe left vertebral artery stenosis. No atrial fibrillation was documented on his ILR. Echocardiogram demonstrated normal LV function. Plavix was changed to Aggrenox. He was seen by cardiology for continued shortness of breath. His volume status appeared to be stable. It was felt that his shortness of breath was more likely to be related to COPD.  He was last seen in our office by [name] [name] on 04/21/16 after his preceding hospitalization. At that time, he was referred for repeat CTA of the chest to reevaluate his thoracic aortic aneurysm. This showed stable mild enlargement of the ascending aorta with evidence of moderate atherosclerotic plaque. Question was raised as to whether atherosclerosis could be contributing to recurrent strokes. Decision was made to continue current antiplatelet therapy with Aggrenox and to follow-up with neurology as previously scheduled. [name] and his wife remain  concerned about potential for enlargement of the TAA.  [name] is most concerned today about continued shortness of breath. This has been present for more than a year and has previously been felt to be primarily due to noncardiac reasons, including underlying COPD, deconditioning, and multiple sclerosis. [name] was recently evaluated by Dr. Chase Caller of [facility] and is awaiting results of his PFTs. He has also been referred for home O2 monitoring. He denies chest pain and palpitations. He has mild leg edema. This has improved since his last visit, which time Lasix was increased for 3 days and then returned back to his baseline dose. Unfortunately, he did not experience improvement in his dyspnea with escalation of Lasix.  [name] has had 2 falls in the last week. Neither were accompanied by lightheadedness/dizziness. He skinned his right knee and right arm but otherwise did not have any serious injuries.   Prior CV studies:   The following studies were reviewed today:  Echo 04/06/16 EF 55-60, normal wall motion, normal diastolic function, mild AI, mild MR  Echo 06/16/15 Mild focal basal septal hypertrophy, EF 60-65%, normal wall motion, grade 1 diastolic dysfunction, mild AI  Carotid US 06/16/15 Summary:Bilateral: mild soft plaque CCA and origin ICA. 1-39% ICA plaquing.  Chest CTA 04/28/16 IMPRESSION: 1. Stable fusiform dilatation of the ascending aorta measuring 4.0 cm in maximum diameter. Recommend annual imaging followup by CTA or MRA. This recommendation follows 2010 ACCF/AHA/AATS/ACR/ASA/SCA/SCAI/SIR/STS/SVM Guidelines for the Diagnosis and Management of Patients with Thoracic Aortic Disease. Circulation. 2010; 121: I097-D532. 2. Moderate thoracic aortic atherosclerosis. 3.  Coronary artery calcifications. 4. Stable pulmonary nodules on the right consistent with prior inflammation and probable subsequent scarring. No new or enlarging pulmonary nodule is  seen.  LHC 1/15 LM No significant disease.  LAD: Luminal irregularities.  LCx: serial 50% and 60% stenoses in the mid LCx.  RCA: 50% proximal RCA stenosis.  LVEF is estimated at 55% without significant wall motion abnormalities noted in RAO projection.  Final Conclusions:  I suspect that at this point COPD is the major cause of his dyspnea.   Echo 12/14 Inferior and distal septal HK, mild LVH, EF 50-55%, mild MR, mild LAE  Past Medical History:  Diagnosis Date  . Arthritis    s/p TKR  . Bladder cancer (HCC)    [facility], [facility] low grade papillary urothileal carcinoma  . BPH (benign prostatic hyperplasia)   . Brugada syndrome    Possible Type II Brugada ECG pattern. No family history of SCD, no syncope, no tachypalpitations.  Marland Kitchen CAD (coronary artery disease)    a. LHC 1/15 - mid LCx 50 and 60%, proximal RCA 50%  . Chronic diastolic CHF (congestive heart failure) ([place])   . CKD (chronic kidney disease)   . COPD (chronic obstructive pulmonary disease) ([place])    Prior heavy smoker. PFTs (3/11): FVC 87%, FEV1 73%, ratio 0.57, DLCO 75%, TLC 121%. Moderate obstructive defect. PFTs (9/15): Only minimal obstruction, sugPrior heavy smoker. PFTs (3/11): FVC 87%, FEV1 73%, ratio 0.57, DLCO 75%, TLC 121%. Moderate obstructive defect. PFTs (9/15) minimal obstruction, poss asthma component   . Depression    with bipolar tendencies  . DOE (dyspnea on exertion)    a. Myoview 8/09- EF 57%, no ischemia // b. Myoview (3/11) EF 68%, diaphragmatic attenuation, no ischemia //  c Echo (4/11) EF 20-25%, mild diastolic dysfunction, PASP 38 mmHg // d. PFTs 9/15 minimal obstruction  /  e. CT negative for ILD  . GERD (gastroesophageal reflux disease)   . History of Doppler ultrasound    Carotid US 3/11 negative for significant stenosis  //  carotid US 6/17: Mild bilateral plaque, 1-39% ICA  . History of echocardiogram    a. Echo 12/14 Inferior and distal septal HK, mild LVH, EF 50-55%, mild MR, mild LAE  //  b.  Echo 6/17: Mild focal basal septal hypertrophy, EF 60-65%, normal wall motion, grade 1 diastolic dysfunction, mild AI  . HLD (hyperlipidemia)   . Hypertensive heart disease with CHF (congestive heart failure) ([place]) 04/08/2009  . Low testosterone   . Lung nodule    a. CT in 2015 >> PET in 12/15 not sugg of malignancy   . LVH (left ventricular hypertrophy)    a. Echo 12/14 Inferior and distal septal HK, mild LVH, EF 50-55%, mild MR, mild LAE  . Mild dementia   . Mitral regurgitation    Echo (4/11) with PISA ERO 0.3 cm^2 and regurgitant volume 41 mL (moderate MR). Echo (12/14) with mild MR.   . MS (multiple sclerosis) ([place])   . MS (multiple sclerosis) ([place])   . Right bundle branch block   . Stroke ([place])   . Thoracic aortic aneurysm (HCC)    4.1 cm 2017  . Urinary retention with incomplete bladder emptying    receives botox injections and treatment for BPH through [facility]    Past Surgical History:  Procedure Laterality Date  . APPENDECTOMY    . EP IMPLANTABLE DEVICE N/A 06/23/2015   Procedure: Loop Recorder Insertion;  Surgeon: Deboraha Sprang, MD;  Location: [place]  CV LAB;  Service: Cardiovascular;  Laterality: N/A;  . HEMORRHOID SURGERY    . LEFT HEART CATHETERIZATION WITH CORONARY ANGIOGRAM N/A 01/05/2013   Procedure: LEFT HEART CATHETERIZATION WITH CORONARY ANGIOGRAM;  Surgeon: Larey Dresser, MD;  Location: [facility] CATH LAB;  Service: Cardiovascular;  Laterality: N/A;  . ROTATOR CUFF REPAIR    . TEE WITHOUT CARDIOVERSION N/A 06/23/2015   Procedure: TRANSESOPHAGEAL ECHOCARDIOGRAM (TEE);  Surgeon: Fay Records, MD;  [facility] ENDOSCOPY;  Service: Cardiovascular;  Laterality: N/A;  . TONSILLECTOMY    . TOTAL KNEE ARTHROPLASTY Right   . TRANSURETHRAL RESECTION OF PROSTATE      Current Medications: Current Meds  Medication Sig  . acetaminophen (TYLENOL) 325 MG tablet Take 650 mg by mouth every 6 (six) hours as needed (for pain or headaches).   Marland Kitchen amantadine (SYMMETREL) 100 MG  capsule TAKE 1 CAPSULE BY MOUTH TWICE DAILY WITH BREAKFAST AND LUNCH  . amLODipine (NORVASC) 10 MG tablet Take 1 tablet (10 mg total) by mouth daily.  Marland Kitchen atorvastatin (LIPITOR) 40 MG tablet Take 1 tablet (40 mg total) by mouth daily.  . bisoprolol (ZEBETA) 10 MG tablet Take 1 tablet (10 mg total) by mouth daily.  . cholecalciferol (VITAMIN D) 400 units TABS tablet Take 400 Units by mouth daily.   . Cyanocobalamin (B-12) 1000 MCG CAPS Take 1,000 mcg by mouth daily.   Marland Kitchen dipyridamole-aspirin (AGGRENOX) 200-25 MG 12hr capsule Take 1 capsule by mouth 2 (two) times daily.  Marland Kitchen doxazosin (CARDURA) 2 MG tablet Take 1 tablet (2 mg total) by mouth daily.  . folic acid (FOLVITE) 1 MG tablet Take 1 mg by mouth daily.  . furosemide (LASIX) 40 MG tablet Take 40 mg by mouth daily.  Marland Kitchen HYDROcodone-acetaminophen (NORCO) 10-325 MG tablet Take 1 tablet by mouth every 6 (six) hours as needed for moderate pain or severe pain.  Marland Kitchen levofloxacin (LEVAQUIN) 250 MG tablet Take 1 tablet (250 mg total) by mouth daily.  Marland Kitchen losartan (COZAAR) 50 MG tablet Take 100 mg by mouth daily.   Marland Kitchen MAGNESIUM CITRATE PO Take 0.5-1 Bottles by mouth once as needed (for mild constipation).   . pantoprazole (PROTONIX) 40 MG tablet Take 1 tablet (40 mg total) by mouth daily.  Marland Kitchen PROAIR HFA 108 (90 Base) MCG/ACT inhaler USE 1 TO 2 INHALATIONS EVERY 6 HOURS AS NEEDED FOR WHEEZING OR SHORTNESS OF BREATH  . rOPINIRole (REQUIP) 1 MG tablet TAKE 1 TABLET AT BEDTIME  . SPIRIVA HANDIHALER 18 MCG inhalation capsule INHALE THE CONTENTS OF 1 CAPSULE WITH 2         INHALATIONS ONCE DAILY AS DIRECTED  . venlafaxine XR (EFFEXOR-XR) 150 MG 24 hr capsule TAKE 1 CAPSULE TWICE A DAY     Allergies:   Acetylcholine; Alcohol-sulfur [sulfur]; Amitriptyline; Bupivacaine; Clomipramine hcl; Cocaine; Desipramine; Ergonovine; Flecainide; Lithium; Loxapine; Nortriptyline; Oxcarbazepine; Procainamide; Procaine; Propafenone; Propofol; and Trifluoperazine   Social History    Social History  . Marital status: Married    Spouse name: N/A  . Number of children: N/A  . Years of education: N/A   Occupational History  . retired Retired   Social History Main Topics  . Smoking status: Former Smoker    Packs/day: 2.00    Years: 40.00    Types: Cigarettes    Quit date: 01/04/1993  . Smokeless tobacco: Former Systems developer    Types: Chew     Comment: patient still smokes a pipe daily  . Alcohol use 15.0 oz/week    25 Cans of  beer per week     Comment: 6 pack/day - 06/20/15 Pt states wuit drinking in the 1970's  . Drug use: No  . Sexual activity: Not Asked   Other Topics Concern  . None   Social History Narrative   Retired from the [employer] after 20 years   [place] Veteran     Family History  Problem Relation Age of Onset  . Stroke Father   . Stroke Mother   . Hypertension Sister   . Kidney cancer Brother   . Arthritis Brother   . Other Brother     knee problems, Bil TKR  . Hypertension      family history     ROS:   Please see the history of present illness.    Review of Systems  Constitution: Positive for decreased appetite and malaise/fatigue.  HENT: Positive for hearing loss.   Eyes: Positive for visual disturbance (sees spots secondary to MS).  Cardiovascular: Positive for dyspnea on exertion and leg swelling.  Respiratory: Positive for cough and snoring.   Endocrine: Negative.   Hematologic/Lymphatic: Bruises/bleeds easily.  Skin: Negative.   Musculoskeletal: Positive for back pain.  Gastrointestinal: Positive for constipation.  Genitourinary: Positive for incomplete emptying.  Neurological: Positive for dizziness, headaches and loss of balance.  Psychiatric/Behavioral: Positive for depression.  Allergic/Immunologic: Negative.     EKGs/Labs/Other Test Reviewed:     Recent Labs: 04/04/2016: ALT 15; Hemoglobin 14.6; Platelets 306 04/07/2016: BUN 10; Creatinine, Ser 1.10; NT-Pro BNP 117; Potassium 4.6; Sodium 134   Recent Lipid  Panel    Component Value Date/Time   CHOL 134 04/05/2016 0436   TRIG 150 (H) 04/05/2016 0436   HDL 43 04/05/2016 0436   CHOLHDL 3.1 04/05/2016 0436   VLDL 30 04/05/2016 0436   LDLCALC 61 04/05/2016 0436   LDLDIRECT 87.0 02/01/2014 1019     Physical Exam:    VS:  BP 104/60   Pulse 63   Ht 5\' 10"  (1.778 m)   Wt 99.3 kg (219 lb)   SpO2 99%   BMI 31.42 kg/m     Wt Readings from Last 3 Encounters:  05/06/16 99.3 kg (219 lb)  05/04/16 97.3 kg (214 lb 6.4 oz)  04/27/16 101.6 kg (224 lb)     Physical Exam  Constitutional: He is oriented to person, place, and time. He appears well-developed and well-nourished. No distress.  HENT:  Head: Normocephalic and atraumatic.  Eyes: No scleral icterus.  Neck: Normal range of motion. No JVD present.  Cardiovascular: Normal rate, regular rhythm, S1 normal and S2 normal.  Exam reveals no gallop and no friction rub.   Murmur heard.  Systolic murmur is present with a grade of 1/6  at the upper right sternal border Pulmonary/Chest: Effort normal and breath sounds normal. He has no wheezes. He has no rhonchi. He has no rales.  Abdominal: Soft. There is no tenderness.  Musculoskeletal: He exhibits edema (trace bilateral LE edema).  Neurological: He is alert and oriented to person, place, and time.  Skin: Skin is warm and dry.  Psychiatric: He has a normal mood and affect.    ASSESSMENT:    1. Shortness of breath   2. Coronary artery disease involving native coronary artery of native heart without angina pectoris   3. History of stroke   4. Thoracic aortic aneurysm without rupture ([place])    PLAN:    1. Shortness of breath This has been a long-standing problem for the patient is likely multifactorial. I  suspect it is primarily due to underlying lung disease and deconditioning. He is currently undergoing pulmonology evaluation by Dr. Chase Caller. We have agreed to defer further cardiac testing at this time.  2. Coronary artery disease  involving native coronary artery of native heart without angina pectoris No chest pain to suggest worsening coronary insufficiency. Question of chronic dyspnea related to coronary disease certainly exists, though left heart catheterization in 2015 revealed nonobstructive CAD. We discussed risks and benefits of repeating ischemia evaluation and have agreed to defer this unless no clear cause for his shortness of breath is identified with pulmonary testing. I would favor a myocardial perfusion stress test over cardiac catheterization, given risk for recurrent strokes with atherosclerotic plaque noted in the descending aorta.  3. History of stroke No new symptoms. Patient has had multiple strokes in the past. Implantable loop recorder has not shown evidence of atrial fibrillation. Recent CTA of the chest showed significant atherosclerosis involving the ascending aorta. We will continue with Aggrenox and aggressive secondary prevention with lipid therapy. I will defer further management to Dr. Erlinda Hong (neurology).  4. Thoracic aortic aneurysm without rupture (HCC) Stable in size by recent chest CTA. The patient and his wife are concerned about possibility of enlargement and rupture. We discussed the natural history of aortic disease and have agreed to repeat a CTA of the chest in about a year.  Dispo:  Return to clinic to see [name] Dopp, PA about 2 months  Medication Adjustments/Labs and Tests Ordered: Current medicines are reviewed at length with the patient today.  Concerns regarding medicines are outlined above.  Medication changes, Labs and Tests ordered today are listed below: No orders of the defined types were placed in this encounter.  Medication Changes: No orders of the defined types were placed in this encounter.  Signed, Nelva Bush, MD  05/08/2016 10:20 AM    [facility] [address], [place], [place]  [address] Phone: [phone]; Fax: [phone]

## 2016-05-08 ENCOUNTER — Encounter: Payer: Self-pay | Admitting: Internal Medicine

## 2016-05-10 DIAGNOSIS — M25519 Pain in unspecified shoulder: Secondary | ICD-10-CM | POA: Diagnosis not present

## 2016-05-13 ENCOUNTER — Other Ambulatory Visit: Payer: Self-pay | Admitting: Family Medicine

## 2016-05-14 ENCOUNTER — Other Ambulatory Visit: Payer: Self-pay | Admitting: *Deleted

## 2016-05-14 ENCOUNTER — Other Ambulatory Visit: Payer: Medicare Other

## 2016-05-14 DIAGNOSIS — N390 Urinary tract infection, site not specified: Secondary | ICD-10-CM

## 2016-05-14 MED ORDER — BISOPROLOL FUMARATE 10 MG PO TABS: 10.0000 mg | ORAL_TABLET | Freq: Every day | ORAL | 3 refills | Status: DC

## 2016-05-16 LAB — URINE CULTURE

## 2016-05-17 ENCOUNTER — Ambulatory Visit (INDEPENDENT_AMBULATORY_CARE_PROVIDER_SITE_OTHER): Payer: Medicare Other | Admitting: Neurology

## 2016-05-17 ENCOUNTER — Encounter: Payer: Self-pay | Admitting: Neurology

## 2016-05-17 VITALS — BP 115/65 | HR 67 | Wt 218.8 lb

## 2016-05-17 DIAGNOSIS — I6340 Cerebral infarction due to embolism of unspecified cerebral artery: Secondary | ICD-10-CM | POA: Diagnosis not present

## 2016-05-17 DIAGNOSIS — Z8673 Personal history of transient ischemic attack (TIA), and cerebral infarction without residual deficits: Secondary | ICD-10-CM

## 2016-05-17 DIAGNOSIS — E785 Hyperlipidemia, unspecified: Secondary | ICD-10-CM

## 2016-05-17 DIAGNOSIS — I639 Cerebral infarction, unspecified: Secondary | ICD-10-CM

## 2016-05-17 DIAGNOSIS — I10 Essential (primary) hypertension: Secondary | ICD-10-CM | POA: Diagnosis not present

## 2016-05-17 NOTE — Progress Notes (Signed)
STROKE NEUROLOGY FOLLOW UP NOTE  NAME: [name] [name] DOB: December 26, 1940  REASON FOR VISIT: stroke follow up HISTORY FROM: pt and wife and chart  Today we had the pleasure of seeing [name] [name] in follow-up at our [facility]rology [facility]. Pt was accompanied by wife.   History Summary [name] is a 76 y.o. male with history of MS diagnosis in 1963 not on meds, CAD, thoracic AA, history of lung nodule benign, urethral carcinoma following with [facility], diastolic CHF, HTN, HLD and GERD admitted on 06/15/2015 for slurred speech. 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 CT head, carotid Doppler, TTE were all negative. LDL 91 and A1c 6.4. His aspirin 81 changed to 325, resumed Lipitor 20, and he was discharged home outpatient PT OT and plan with outpatient TEE and Loop recorder. However, he was readmitted on 06/20/2015 for presyncope with UTI, generalized weakness, dysarthria and gait instability. MRI showed essentially the same infarct as last admission. Symptoms consider as recrudescence of previous stroke in the setting of infection and presyncope. DVT and TEE negative without PFO. Loop recorder placed. Aspirin was changed to Plavix on discharge.  Follow up 08/14/15 - the patient has been doing well. Wife complains of mild memory loss after stroke. Blood pressure today 105/71. On Plavix and Lipitor, without side effect. Not candidate for RESPECT ESUS due to urethral cancer.     Follow up 11/17/15 - he has been doing well. Had loop recorder placed but has some signal problems and no report since July. Pt and wife are contacting cardiology. On plavix and lipitor and no side effect. Today BP 123/80 in clinic but at home still high between 140-155. He is on 3 BP meds, following with PCP.   Interval History During the interval time, pt was admitted on 04/04/16 for R sided weakness. HereceivedIV t-PA. MRI showed small right cerebellar  and right occipital lobe with left ACA punctate infarct, again embolic pattern. CTA head and neck only showed left VA stenosis. EF 55-60%. TCD bubble study negative for PFO, and loop recorder showed no afib. LDL 61 and A1C 5.6. His plavix changed to aggrenox on discharge. CTA chest showed fusiform dilatation of ascending aorta and mild to moderate thoracic descending aorta atherosclerosis.   He follows with [facility] for urethral cancer and recurrent UTI. So far no recurrence or metastasis found. He has appointment with them in 07/2016.  REVIEW OF SYSTEMS: Full 14 system review of systems performed and notable only for those listed below and in HPI above, all others are negative:  Constitutional:  fatigue Cardiovascular: Leg swelling Ear/Nose/Throat:  Hearing loss Skin: wounds Eyes:  Light sensitivity Respiratory:  SOB, chest tightness Gastroitestinal:  Constipation Genitourinary: frequent urination Hematology/Lymphatic:  Bleeding easily Endocrine: Cold intolerance Musculoskeletal:  Joint pain, walking difficulty Allergy/Immunology:   Neurological:  Memory loss, dizziness Psychiatric: n Sleep: Restless leg, daytime sleepiness, frequent waking  The following represents the patient's updated allergies and side effects list: Allergies  Allergen Reactions  . Acetylcholine     Patient was suspected of having "Brugada Syndrome": (BrS) is a genetic condition that results in abnormal electrical activity within the heart, increasing the risk of sudden cardiac death. Those affected may have episodes of passing out.  Marland Kitchen Alcohol-Sulfur [Sulfur] Other ([name] Comments)    Patient was suspected of having "Brugada Syndrome"  . Amitriptyline Other ([name] Comments)    Patient was suspected of having "Brugada Syndrome"  .  Bupivacaine Other ([name] Comments)    Patient was suspected of having "Brugada Syndrome"  . Clomipramine Hcl Other ([name] Comments)    Patient was suspected of having "Brugada  Syndrome"  . Cocaine Other ([name] Comments)    Patient was suspected of having "Brugada Syndrome"  . Desipramine Other ([name] Comments)    Patient was suspected of having "Brugada Syndrome"  . Ergonovine Other ([name] Comments)    Patient was suspected of having "Brugada Syndrome"  . Flecainide Other ([name] Comments)    Patient was suspected of having "Brugada Syndrome"  . Lithium Other ([name] Comments)    Patient was suspected of having "Brugada Syndrome"  . Loxapine Other ([name] Comments)    Patient was suspected of having "Brugada Syndrome"  . Nortriptyline Other ([name] Comments)    Patient was suspected of having "Brugada Syndrome"  . Oxcarbazepine Other ([name] Comments)    Patient was suspected of having "Brugada Syndrome"  . Procainamide Other ([name] Comments)    Patient was suspected of having "Brugada Syndrome"  . Procaine Other ([name] Comments)    Patient was suspected of having "Brugada Syndrome"  . Propafenone Other ([name] Comments)    Patient was suspected of having "Brugada Syndrome"  . Propofol Other ([name] Comments)    Patient was suspected of having "Brugada Syndrome"  . Trifluoperazine Other ([name] Comments)    Patient was suspected of having "Brugada Syndrome"    The neurologically relevant items on the patient's problem list were reviewed on today's visit.  Neurologic Examination  A problem focused neurological exam (12 or more points of the single system neurologic examination, vital signs counts as 1 point, cranial nerves count for 8 points) was performed.  Blood pressure 115/65, pulse 67, weight 218 lb 12.8 oz (99.2 kg).  General - Well nourished, well developed, in no apparent distress.  Ophthalmologic - Sharp disc margins OU.   Cardiovascular - Regular rate and rhythm with no murmur.  Mental Status -  Level of arousal and orientation to time, place, and person were intact. Language including expression, naming, repetition, comprehension was assessed and found  intact.  Cranial Nerves II - XII - II - Visual field intact OU. III, IV, VI - Extraocular movements intact. V - Facial sensation intact bilaterally. VII - mild right facial asymmetry (chronic after right facial surgery in the past). VIII - Hearing & vestibular intact bilaterally. X - Palate elevates symmetrically. XI - Chin turning & shoulder shrug intact bilaterally. XII - Tongue protrusion intact.  Motor Strength - The patient's strength was normal in all extremities and pronator drift was absent.  Bulk was normal and fasciculations were absent.   Motor Tone - Muscle tone was assessed at the neck and appendages and was normal.  Reflexes - The patient's reflexes were 1+ in all extremities and he had no pathological reflexes.  Sensory - Light touch, temperature/pinprick, vibration and proprioception, and Romberg testing were assessed and were normal.    Coordination - The patient had normal movements in the hands and feet with no ataxia or dysmetria.  Tremor was absent.  Gait and Station - walk with walker at home, but no device in clinic today, decreased arm swing, stooped posturing, small stride.    Data reviewed: I personally reviewed the images and agree with the radiology interpretations.  Ct Head Wo Contrast 06/20/2015   Stable atrophic and ischemic changes. No acute abnormality is noted.   MRI brain 06/15/15 Areas of acute infarction in the region of the insula and  posterior frontal lobe on the right consistent with right MCA branch vessel occlusion. No swelling or hemorrhage. Extensive chronic small vessel ischemic changes elsewhere throughout the brain, progressive since the previous exam. Given the history of multiple sclerosis clinically, some of these white matter lesions could relate to chronic demyelination.  Mr [name] [name] Contrast 06/20/2015   Subcentimeter focus of restricted diffusion in the superior RIGHT insula not present previously, representing  progression of ischemia. Otherwise stable multifocal areas of acute RIGHT MCA infarction. No interval development of large vessel occlusion, or significant mass effect. Mild postcontrast enhancement, consistent with the subacute time course. There is no significant change on DWI comparing with previous MRI. The "new" DWI signal likely the same DWI as before just due to different cut.   CT Angio Head 06/16/2015 CT HEAD: Evolving small RIGHT frontal/ MCA territory infarct. Moderate to severe white matter changes better characterized on recent MRI of the brain. CTA HEAD: Negative.  2D Echocardiogram 06/16/15 - Left ventricle: The cavity size was normal. There was mild focal basal hypertrophy of the septum. Systolic function was normal. The estimated ejection fraction was in the range of 60% to 65%. Wall motion was normal; there were no regional wall motion abnormalities. Doppler parameters are consistent with abnormal left ventricular relaxation (grade 1 diastolic dysfunction). - Aortic valve: There was mild regurgitation. Impressions: - No cardiac source of emboli was indentified.  Carotid Doppler 06/16/15 There is 1-39% bilateral ICA stenosis. Vertebral artery flow is antegrade.   LE venous doppler - There is no DVT or SVT noted in the bilateral lower extremities.   TEE - normal EF, no SOE, no PFO  CT chest 06/05/15 1. The nodules are stable from 06/26/2013. More spiculated nodules inferomedially in the right middle lobe were not previously positive on PET scan and accordingly are likely benign. The 5 by 6 mm nodule is also stable over the past 2 years. This 2 year stability is compatible with benign process, and based on current guidelines, further follow up surveillance is not required. 2. 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 Recommend annual imaging followup by CTA or MRA. 3. Coronary, aortic arch, and branch vessel atherosclerotic vascular disease.  Ct Head Code  Stroke W/o Cm 04/04/2016 1. No acute abnormality 2. ASPECTS is 10  Ct Angio Head W Or Wo Contrast Ct Angio Neck W Or Wo Contrast 04/04/2016 No significant carotid artery stenosis. Anterior and middle cerebral arteries patent without significant stenosis or occlusion Severe stenosis origin of the left vertebral artery and mild stenosis origin of the right vertebral artery. Posterior intracranial circulation is patent.   Mr Brain Wo Contrast 04/05/2016 Subcentimeter acute RIGHT cerebellar and RIGHT occipital lobe infarcts. Old small RIGHT frontal lobe infarct and bold small cerebellar infarcts. Moderate chronic small vessel ischemic disease.   2D Echocardiogram  - Left ventricle: The cavity size was normal. Wall thickness wasnormal. Systolic function was normal. The estimated ejectionfraction was in the range of 55% to 60%. Wall motion was normal;there were no regional wall motion abnormalities. Leftventricular diastolic function parameters were normal, whenadjusted for age. - Aortic valve: There was mild regurgitation. - Mitral valve: There was mild regurgitation.  TCD with bubble study No HITS. No PFO  CT chest 04/28/16 1. Stable fusiform dilatation of the ascending aorta measuring 4.0 cm in maximum diameter. Recommend annual imaging followup by CTA or MRA. This recommendation follows 2010 ACCF/AHA/AATS/ACR/ASA/SCA/SCAI/SIR/STS/SVM Guidelines for the Diagnosis and Management of Patients with Thoracic Aortic Disease. Circulation. 2010;  121: [id number]. 2. Moderate thoracic aortic atherosclerosis. 3. Coronary artery calcifications. 4. Stable pulmonary nodules on the right consistent with prior inflammation and probable subsequent scarring. No new or enlarging pulmonary nodule is seen.  Component     Latest Ref Rng & Units 06/16/2015 06/21/2015 06/26/2015  Cholesterol     0 - 200 mg/dL 185 140   Triglycerides     <150 mg/dL 256 (H) 239 (H)   HDL Cholesterol     >40 mg/dL 43 33 (L)    Total CHOL/HDL Ratio     RATIO 4.3 4.2   VLDL     0 - 40 mg/dL 51 (H) 48 (H)   LDL (calc)     0 - 99 mg/dL 91 59   Hemoglobin A1C     <5.7 % 6.4 (H)  6.3 (H)  Mean Plasma Glucose     mg/dL 137  134   Component     Latest Ref Rng & Units 04/05/2016  Cholesterol     0 - 200 mg/dL 134  Triglycerides     <150 mg/dL 150 (H)  HDL Cholesterol     >40 mg/dL 43  Total CHOL/HDL Ratio     RATIO 3.1  VLDL     0 - 40 mg/dL 30  LDL (calc)     0 - 99 mg/dL 61  Hemoglobin A1C     4.8 - 5.6 % 5.6  Mean Plasma Glucose     mg/dL 114    Assessment: As you may recall, he is a 76 y.o. Caucasian male with PMH of MS diagnosis in 1963 not on meds, CAD, thoracic AA, history of lung nodule benign, urethral carcinoma following with [facility], diastolic CHF, HTN, HLD and GERD admitted on 06/15/2015 for right MCA branch vessel occlusion with infarcts at right insular and posterior frontal lobe. CT head, carotid Doppler, TTE were all negative. LDL 91 and A1c 6.4. His aspirin 81 changed to 325, resumed Lipitor 20, and he was discharged home outpatient PT OT and plan with outpatient TEE and Loop recorder. However, he was readmitted on 06/20/2015 for presyncope with UTI, generalized weakness, dysarthria and gait instability. MRI showed essentially the same infarct as last admission. Symptoms consider as recrudescence of previous stroke in the setting of infection and presyncope. DVT and TEE negative without PFO. Loop recorder placed. Aspirin was changed to Plavix on discharge.   Admitted on 04/04/16 and receivedIV t-PA. MRI showed small right cerebellar and right occipital lobe with left ACA punctate infarct, again embolic pattern. CTA head and neck only showed left VA stenosis. EF 55-60%. TCD bubble study negative for PFO, and loop recorder showed no afib. LDL 61 and A1C 5.6. His plavix changed to aggrenox on discharge. CTA chest showed fusiform dilatation of ascending aorta and mild to moderate thoracic descending aorta  atherosclerosis, which can not explain the stroke.Marland Kitchen   He follows with [facility] for urethral cancer and recurrent UTI. So far no recurrence or metastasis found. He has appointment with them in 07/2016. Pt embolic stroke etiology unclear, so far all embolic work up negative. Concerning for his stroke is due to recurrent UTI and urethral cancer. He has appointment soon with oncology and possible PET scanning.   Regarding previous MS diagnosis in 1960s, patient has not been on any MS medication, no MS relapse, no significant symptoms of MS, I am not sure about the diagnosis, MRI white matter changes could be small vessel etiology instead of chronic demyelination. Do  not think he needs of initiating MS treatment at this time.  Plan:  - continue aggrenox for stroke prevention - follow up loop recorder. - may consider AC if stroke recurrence.  - walk with walker and avoid fall.  - control UTI and continue follow up with oncology in July  - Follow up with your primary care physician for stroke risk factor modification. Recommend maintain blood pressure goal 130-140/80-90, diabetes with hemoglobin A1c goal below 7.0% and lipids with LDL cholesterol goal below 70 mg/dL.  - check BP at home and record.  - healthy diet and regular exercise - follow up with me in 6 months.  I spent more than 25 minutes of face to face time with the patient. Greater than 50% of time was spent in counseling and coordination of care. We discussed continued antiplatelet therapy, BP monitoring and management, and follow up with cardiology for loop recorder.   No orders of the defined types were placed in this encounter.   No orders of the defined types were placed in this encounter.   Patient Instructions  - continue aggrenox for stroke prevention - follow up loop recorder. - may consider strong blood thinners if this happens again or if there is other findings.  - walk with walker and avoid fall.  -  control UTI and continue follow up with oncology in July  - Follow up with your primary care physician for stroke risk factor modification. Recommend maintain blood pressure goal 130-140/80-90, diabetes with hemoglobin A1c goal below 7.0% and lipids with LDL cholesterol goal below 70 mg/dL.  - check BP at home and record.  - healthy diet and regular exercise - follow up with me in 6 months.   Rosalin Hawking, MD PhD [facility] [address] [place], [place] [address] [phone]

## 2016-05-17 NOTE — Patient Instructions (Addendum)
-   continue aggrenox for stroke prevention - follow up loop recorder. - may consider strong blood thinners if this happens again or if there is other findings.  - walk with walker and avoid fall.  - control UTI and continue follow up with oncology in July  - Follow up with your primary care physician for stroke risk factor modification. Recommend maintain blood pressure goal 130-140/80-90, diabetes with hemoglobin A1c goal below 7.0% and lipids with LDL cholesterol goal below 70 mg/dL.  - check BP at home and record.  - healthy diet and regular exercise - follow up with me in 6 months.

## 2016-05-19 ENCOUNTER — Other Ambulatory Visit: Payer: Medicare Other

## 2016-05-19 ENCOUNTER — Telehealth: Payer: Self-pay | Admitting: Family Medicine

## 2016-05-19 DIAGNOSIS — N390 Urinary tract infection, site not specified: Secondary | ICD-10-CM

## 2016-05-19 NOTE — Telephone Encounter (Signed)
Over the past few days pt has developed urinary symptoms again with the burning and pressure with urination and just feels bad in general. Please advise.

## 2016-05-20 ENCOUNTER — Telehealth: Payer: Self-pay | Admitting: Internal Medicine

## 2016-05-20 ENCOUNTER — Encounter: Payer: Self-pay | Admitting: Internal Medicine

## 2016-05-20 DIAGNOSIS — R269 Unspecified abnormalities of gait and mobility: Secondary | ICD-10-CM | POA: Diagnosis not present

## 2016-05-20 DIAGNOSIS — J438 Other emphysema: Secondary | ICD-10-CM

## 2016-05-20 DIAGNOSIS — R0602 Shortness of breath: Secondary | ICD-10-CM | POA: Diagnosis not present

## 2016-05-20 MED ORDER — CIPROFLOXACIN HCL 250 MG PO TABS: 250.0000 mg | ORAL_TABLET | Freq: Two times a day (BID) | ORAL | 0 refills | Status: DC

## 2016-05-20 NOTE — Telephone Encounter (Signed)
Pt's wife aware of recommendations. Med sent to pharm and referral placed.

## 2016-05-20 NOTE — Telephone Encounter (Signed)
cipro 250 bid for 7 days.  I want him to see his urologist for recurrent repeated UTI's to see if they have any suggestions for prevention.

## 2016-05-20 NOTE — Telephone Encounter (Signed)
LMTRC

## 2016-05-20 NOTE — Telephone Encounter (Signed)
[name] [name]  He was supposd to fu after PFT and ONO; pleae ensure he does with an APP  Thanks  Dr. Brand Males, M.D., [facility].C.P Pulmonary and Critical Care Medicine Staff Physician [facility] Pager: [phone], If no answer or between  15:00h - 7:00h: call [phone]  05/20/2016 6:36 PM

## 2016-05-21 DIAGNOSIS — R269 Unspecified abnormalities of gait and mobility: Secondary | ICD-10-CM | POA: Diagnosis not present

## 2016-05-21 DIAGNOSIS — R0602 Shortness of breath: Secondary | ICD-10-CM | POA: Diagnosis not present

## 2016-05-24 ENCOUNTER — Ambulatory Visit (INDEPENDENT_AMBULATORY_CARE_PROVIDER_SITE_OTHER): Payer: Medicare Other | Admitting: Urology

## 2016-05-24 ENCOUNTER — Ambulatory Visit (INDEPENDENT_AMBULATORY_CARE_PROVIDER_SITE_OTHER): Payer: Medicare Other | Admitting: *Deleted

## 2016-05-24 ENCOUNTER — Telehealth: Payer: Self-pay | Admitting: Internal Medicine

## 2016-05-24 ENCOUNTER — Encounter: Payer: Self-pay | Admitting: Urology

## 2016-05-24 DIAGNOSIS — N390 Urinary tract infection, site not specified: Secondary | ICD-10-CM

## 2016-05-24 DIAGNOSIS — I639 Cerebral infarction, unspecified: Secondary | ICD-10-CM

## 2016-05-24 LAB — URINALYSIS, COMPLETE
BILIRUBIN UA: NEGATIVE
GLUCOSE, UA: NEGATIVE
KETONES UA: NEGATIVE
NITRITE UA: NEGATIVE
Protein, UA: NEGATIVE
RBC UA: NEGATIVE
SPEC GRAV UA: 1.01 (ref 1.005–1.030)
Urobilinogen, Ur: 0.2 mg/dL (ref 0.2–1.0)
pH, UA: 6.5 (ref 5.0–7.5)

## 2016-05-24 LAB — MICROSCOPIC EXAMINATION
Bacteria, UA: NONE SEEN
RBC MICROSCOPIC, UA: NONE SEEN /HPF (ref 0–?)

## 2016-05-24 NOTE — Progress Notes (Signed)
05/24/2016 12:16 PM   [name] [name] 03/26/1940 [id number]  Referring provider: [name], MD [address] [place], [place] [address]  Chief Complaint  Patient presents with  . Recurrent UTI    HPI:  76 year old male referred for further management of recurrent urinary tract infection, neurogenic bladder, urethral cancer He has been seen and followed at [facility] for the above problems. Recently he had a stroke and since that time has been treated for 5 times with different antibiotics for incompletely treated or recurrent urinary tract infections.  The patient hasa history of MS, and has been treated for urinary frequency and overactive bladder symptoms with Botox. He subsequently developed retention requiring CIC but then underwent a TURP. Following the TURP he had significant improvement in his urinary tract symptoms going from getting up 5-10 times per night to 1-2 times per night. Unfortunately, he had a bladder neck contracture which causes symptoms to regress. He also has had progression once his Botox wears off. During this process, he was noted to have urethral polyps which were resected and noted to be low-grade superficial. He has had several resections with the most recent one being 01/2014. His last cystoscopic evaluation was in July 2017 which was normal.  The patient has been on clean intermittent catheterization several different times. Recently however he has not been doing this. Since that time he stopped doing this he has had several recurrent urinary tract infections.  Urologic History 1. urinary urgency, freq, incont, with BOO - Multiple sclerosis - Botox 200 units in bladder neck -05/04/11 Botox 200 units into bladder- after injxn had significant Improvement in urinary frequency urgency and nocturia. However he had to perform CIC 4 to 5 times a day. - 07/05/11- TURP for BOO- After TURP, able to empty with an excellent stream. No longer has  performed CIC. Continues to have good control of his urgency frequency and urge incontinence from the Botox injection of the bladder. PVR 50-177mL. Path = BPH only  - 11/18/11- Reports recurrence of detrusor overactivity at 5 months after Botox. - 12/14/11- dilation of bladder neck contraction and Botox injection with 200 units -09/13/12 dilation BNC, bladder biopsy (benign)  -11/29/12 bladder botox 200 units  - 12/15/12 biopsy of urethral polyp Ta LG -05/16/13 TUR prostatic urethra = ASAP and LG UC -01/16/14 OR = tiny papillary lesion -07/18/14 cysto neg    - 07/2015 cysto neg     -01/2016 repeat Botox 200 units   PMH: Past Medical History:  Diagnosis Date  . Arthritis    s/p TKR  . Bladder cancer (HCC)    [facility], Ta low grade papillary urothileal carcinoma  . BPH (benign prostatic hyperplasia)   . Brugada syndrome    Possible Type II Brugada ECG pattern. No family history of SCD, no syncope, no tachypalpitations.  Marland Kitchen CAD (coronary artery disease)    a. LHC 1/15 - mid LCx 50 and 60%, proximal RCA 50%  . Chronic diastolic CHF (congestive heart failure) ([place])   . CKD (chronic kidney disease)   . COPD (chronic obstructive pulmonary disease) ([place])    Prior heavy smoker. PFTs (3/11): FVC 87%, FEV1 73%, ratio 0.57, DLCO 75%, TLC 121%. Moderate obstructive defect. PFTs (9/15): Only minimal obstruction, sugPrior heavy smoker. PFTs (3/11): FVC 87%, FEV1 73%, ratio 0.57, DLCO 75%, TLC 121%. Moderate obstructive defect. PFTs (9/15) minimal obstruction, poss asthma component   . Depression    with bipolar tendencies  . DOE (dyspnea on  exertion)    a. Myoview 8/09- EF 57%, no ischemia // b. Myoview (3/11) EF 68%, diaphragmatic attenuation, no ischemia //  c Echo (4/11) EF 09-32%, mild diastolic dysfunction, PASP 38 mmHg // d. PFTs 9/15 minimal obstruction  /  e. CT negative for ILD  . GERD (gastroesophageal reflux disease)   . History of Doppler ultrasound    Carotid US 3/11 negative for significant  stenosis  //  carotid US 6/17: Mild bilateral plaque, 1-39% ICA  . History of echocardiogram    a. Echo 12/14 Inferior and distal septal HK, mild LVH, EF 50-55%, mild MR, mild LAE  //  b. Echo 6/17: Mild focal basal septal hypertrophy, EF 60-65%, normal wall motion, grade 1 diastolic dysfunction, mild AI  . HLD (hyperlipidemia)   . Hypertensive heart disease with CHF (congestive heart failure) ([place]) 04/08/2009  . Low testosterone   . Lung nodule    a. CT in 2015 >> PET in 12/15 not sugg of malignancy   . LVH (left ventricular hypertrophy)    a. Echo 12/14 Inferior and distal septal HK, mild LVH, EF 50-55%, mild MR, mild LAE  . Mild dementia   . Mitral regurgitation    Echo (4/11) with PISA ERO 0.3 cm^2 and regurgitant volume 41 mL (moderate MR). Echo (12/14) with mild MR.   . MS (multiple sclerosis) ([place])   . MS (multiple sclerosis) ([place])   . Right bundle branch block   . Stroke ([place])   . Thoracic aortic aneurysm (HCC)    4.1 cm 2017  . Urinary retention with incomplete bladder emptying    receives botox injections and treatment for BPH through [facility]    Surgical History: Past Surgical History:  Procedure Laterality Date  . APPENDECTOMY    . EP IMPLANTABLE DEVICE N/A 06/23/2015   Procedure: Loop Recorder Insertion;  Surgeon: Deboraha Sprang, MD;  Location: [facility] CV LAB;  Service: Cardiovascular;  Laterality: N/A;  . HEMORRHOID SURGERY    . LEFT HEART CATHETERIZATION WITH CORONARY ANGIOGRAM N/A 01/05/2013   Procedure: LEFT HEART CATHETERIZATION WITH CORONARY ANGIOGRAM;  Surgeon: Larey Dresser, MD;  Location: [facility] CATH LAB;  Service: Cardiovascular;  Laterality: N/A;  . ROTATOR CUFF REPAIR    . TEE WITHOUT CARDIOVERSION N/A 06/23/2015   Procedure: TRANSESOPHAGEAL ECHOCARDIOGRAM (TEE);  Surgeon: Fay Records, MD;  Location: [facility] ENDOSCOPY;  Service: Cardiovascular;  Laterality: N/A;  . TONSILLECTOMY    . TOTAL KNEE ARTHROPLASTY Right   . TRANSURETHRAL RESECTION OF PROSTATE       Home Medications:  Allergies as of 05/24/2016      Reactions   Acetylcholine    Patient was suspected of having "Brugada Syndrome": (BrS) is a genetic condition that results in abnormal electrical activity within the heart, increasing the risk of sudden cardiac death. Those affected may have episodes of passing out.   Alcohol-sulfur [sulfur] Other (See Comments)   Patient was suspected of having "Brugada Syndrome"   Amitriptyline Other (See Comments)   Patient was suspected of having "Brugada Syndrome"   Bupivacaine Other (See Comments)   Patient was suspected of having "Brugada Syndrome"   Clomipramine Hcl Other (See Comments)   Patient was suspected of having "Brugada Syndrome"   Cocaine Other (See Comments)   Patient was suspected of having "Brugada Syndrome"   Desipramine Other (See Comments)   Patient was suspected of having "Brugada Syndrome"   Ergonovine Other (See Comments)   Patient was suspected of having "Brugada Syndrome"  Flecainide Other (See Comments)   Patient was suspected of having "Brugada Syndrome"   Lithium Other (See Comments)   Patient was suspected of having "Brugada Syndrome"   Loxapine Other (See Comments)   Patient was suspected of having "Brugada Syndrome"   Nortriptyline Other (See Comments)   Patient was suspected of having "Brugada Syndrome"   Oxcarbazepine Other (See Comments)   Patient was suspected of having "Brugada Syndrome"   Procainamide Other (See Comments)   Patient was suspected of having "Brugada Syndrome"   Procaine Other (See Comments)   Patient was suspected of having "Brugada Syndrome"   Propafenone Other (See Comments)   Patient was suspected of having "Brugada Syndrome"   Propofol Other (See Comments)   Patient was suspected of having "Brugada Syndrome"   Trifluoperazine Other (See Comments)   Patient was suspected of having "Brugada Syndrome"      Medication List       Accurate as of 05/24/16 12:16 PM. Always use your  most recent med list.          acetaminophen 325 MG tablet Commonly known as:  TYLENOL Take 650 mg by mouth every 6 (six) hours as needed (for pain or headaches).   amantadine 100 MG capsule Commonly known as:  SYMMETREL TAKE 1 CAPSULE BY MOUTH TWICE DAILY WITH BREAKFAST AND LUNCH   amLODipine 10 MG tablet Commonly known as:  NORVASC Take 1 tablet (10 mg total) by mouth daily.   atorvastatin 40 MG tablet Commonly known as:  LIPITOR Take 1 tablet (40 mg total) by mouth daily.   B-12 1000 MCG Caps Take 1,000 mcg by mouth daily.   bisoprolol 10 MG tablet Commonly known as:  ZEBETA Take 1 tablet (10 mg total) by mouth daily.   cholecalciferol 400 units Tabs tablet Commonly known as:  VITAMIN D Take 400 Units by mouth daily.   ciprofloxacin 250 MG tablet Commonly known as:  CIPRO Take 1 tablet (250 mg total) by mouth 2 (two) times daily.   dipyridamole-aspirin 200-25 MG 12hr capsule Commonly known as:  AGGRENOX Take 1 capsule by mouth 2 (two) times daily.   doxazosin 2 MG tablet Commonly known as:  CARDURA Take 1 tablet (2 mg total) by mouth daily.   folic acid 1 MG tablet Commonly known as:  FOLVITE Take 1 mg by mouth daily.   furosemide 40 MG tablet Commonly known as:  LASIX Take 40 mg by mouth daily.   HYDROcodone-acetaminophen 10-325 MG tablet Commonly known as:  NORCO Take 1 tablet by mouth every 6 (six) hours as needed for moderate pain or severe pain.   losartan 50 MG tablet Commonly known as:  COZAAR Take 100 mg by mouth daily.   MAGNESIUM CITRATE PO Take 0.5-1 Bottles by mouth once as needed (for mild constipation).   pantoprazole 40 MG tablet Commonly known as:  PROTONIX Take 1 tablet (40 mg total) by mouth daily.   PROAIR HFA 108 (90 Base) MCG/ACT inhaler Generic drug:  albuterol USE 1 TO 2 INHALATIONS EVERY 6 HOURS AS NEEDED FOR WHEEZING OR SHORTNESS OF BREATH   rOPINIRole 1 MG tablet Commonly known as:  REQUIP TAKE 1 TABLET AT BEDTIME    SPIRIVA HANDIHALER 18 MCG inhalation capsule Generic drug:  tiotropium INHALE THE CONTENTS OF 1 CAPSULE WITH 2         INHALATIONS ONCE DAILY AS DIRECTED   venlafaxine XR 150 MG 24 hr capsule Commonly known as:  EFFEXOR-XR TAKE 1 CAPSULE TWICE A DAY  Allergies:  Allergies  Allergen Reactions  . Acetylcholine     Patient was suspected of having "Brugada Syndrome": (BrS) is a genetic condition that results in abnormal electrical activity within the heart, increasing the risk of sudden cardiac death. Those affected may have episodes of passing out.  Marland Kitchen Alcohol-Sulfur [Sulfur] Other (See Comments)    Patient was suspected of having "Brugada Syndrome"  . Amitriptyline Other (See Comments)    Patient was suspected of having "Brugada Syndrome"  . Bupivacaine Other (See Comments)    Patient was suspected of having "Brugada Syndrome"  . Clomipramine Hcl Other (See Comments)    Patient was suspected of having "Brugada Syndrome"  . Cocaine Other (See Comments)    Patient was suspected of having "Brugada Syndrome"  . Desipramine Other (See Comments)    Patient was suspected of having "Brugada Syndrome"  . Ergonovine Other (See Comments)    Patient was suspected of having "Brugada Syndrome"  . Flecainide Other (See Comments)    Patient was suspected of having "Brugada Syndrome"  . Lithium Other (See Comments)    Patient was suspected of having "Brugada Syndrome"  . Loxapine Other (See Comments)    Patient was suspected of having "Brugada Syndrome"  . Nortriptyline Other (See Comments)    Patient was suspected of having "Brugada Syndrome"  . Oxcarbazepine Other (See Comments)    Patient was suspected of having "Brugada Syndrome"  . Procainamide Other (See Comments)    Patient was suspected of having "Brugada Syndrome"  . Procaine Other (See Comments)    Patient was suspected of having "Brugada Syndrome"  . Propafenone Other (See Comments)    Patient was suspected of having  "Brugada Syndrome"  . Propofol Other (See Comments)    Patient was suspected of having "Brugada Syndrome"  . Trifluoperazine Other (See Comments)    Patient was suspected of having "Brugada Syndrome"    Family History: Family History  Problem Relation Age of Onset  . Stroke Father   . Stroke Mother   . Hypertension Sister   . Kidney cancer Brother   . Arthritis Brother   . Other Brother        knee problems, Bil TKR  . Hypertension Unknown        family history  . Prostate cancer Neg Hx   . Bladder Cancer Neg Hx     Social History:  reports that he quit smoking about 23 years ago. His smoking use included Cigarettes. He has a 80.00 pack-year smoking history. He has quit using smokeless tobacco. His smokeless tobacco use included Chew. He reports that he drinks about 15.0 oz of alcohol per week . He reports that he does not use drugs.  ROS:                                        Physical Exam: There were no vitals taken for this visit.  Constitutional:  Alert and oriented, No acute distress. HEENT: [place] AT, moist mucus membranes.  Trachea midline, no masses. Cardiovascular: No clubbing, cyanosis, or edema. Respiratory: Normal respiratory effort, no increased work of breathing. GI: Abdomen is soft, nontender, nondistended, no abdominal masses GU: No CVA tenderness.  Skin: No rashes, bruises or suspicious lesions. Lymph: No cervical or inguinal adenopathy. Neurologic: Grossly intact, no focal deficits, moving all 4 extremities. Psychiatric: Normal mood and affect.  Laboratory Data: Lab Results  Component  Value Date   WBC 7.5 04/04/2016   HGB 14.6 04/04/2016   HCT 43.0 04/04/2016   MCV 99.3 04/04/2016   PLT 306 04/04/2016    Lab Results  Component Value Date   CREATININE 1.10 04/07/2016    Lab Results  Component Value Date   PSA 1.59 07/25/2014    Lab Results  Component Value Date   TESTOSTERONE 224 (L) 07/25/2014    Lab Results    Component Value Date   HGBA1C 5.6 04/05/2016    Urinalysis    Component Value Date/Time   COLORURINE YELLOW 04/27/2016 1253   APPEARANCEUR CLEAR 04/27/2016 1253   LABSPEC 1.010 04/27/2016 1253   PHURINE 6.0 04/27/2016 1253   GLUCOSEU NEGATIVE 04/27/2016 1253   HGBUR NEGATIVE 04/27/2016 1253   BILIRUBINUR NEGATIVE 04/27/2016 1253   KETONESUR NEGATIVE 04/27/2016 1253   PROTEINUR NEGATIVE 04/27/2016 1253   UROBILINOGEN 0.2 07/04/2013 1050   NITRITE NEGATIVE 04/27/2016 1253   LEUKOCYTESUR TRACE (A) 04/27/2016 1253    Pertinent Imaging: No imaging is available  Assessment & Plan:  The patient has of a complex urologic history including a neurogenic bladder secondary to MS that is been treated with both TURP and Botox and intermittent CIC. He has had subsequent bladder neck contractions which have been dilated. His last Botox injection was in January 2018, the patient and his wife feels that he did not respond as well  He has not been cathing or emptying his bladder well over the last 3 months and has subsequently developed recurrent urinary tract infections. In addition, the patient has a history of urethral cancer. His last negative cystoscopy was in July 2016.  1. Recurrent UTI The patient will start to catheterize himself 3 times daily,  First thing in the morning/oncemid day/once prior to bedtime. This should minimize his urinary tract infections. I will then plan to follow up with the patient in 6 weeks with a renal ultrasound prior, at which point we will perform cystoscopy. Cystoscopy will help with his urethral cancer surveillance as well as evaluate the bladder neck. If the patient does not have a bladder neck contraction, Botox may be reasonable to try again.  - Urinalysis, Complete - CULTURE, URINE COMPREHENSIVE - US Renal; Future   Return in about 6 weeks (around 07/05/2016) for w/ renal ultrasound prior.  Ardis Hughs, [facility] [address], [place], [place] [address] [phone]

## 2016-05-24 NOTE — Telephone Encounter (Signed)
Called and spoke with pts wife and she is anxious to get these results.  She stated that he had this done last Thursday and returned the box to [facility] on Friday.  She stated that they are going out of town on Thursday and would like to get these results prior to that date if possible.  MR please advise. thanks  Allergies  Allergen Reactions  . Acetylcholine     Patient was suspected of having "Brugada Syndrome": (BrS) is a genetic condition that results in abnormal electrical activity within the heart, increasing the risk of sudden cardiac death. Those affected may have episodes of passing out.  Marland Kitchen Alcohol-Sulfur [Sulfur] Other (See Comments)    Patient was suspected of having "Brugada Syndrome"  . Amitriptyline Other (See Comments)    Patient was suspected of having "Brugada Syndrome"  . Bupivacaine Other (See Comments)    Patient was suspected of having "Brugada Syndrome"  . Clomipramine Hcl Other (See Comments)    Patient was suspected of having "Brugada Syndrome"  . Cocaine Other (See Comments)    Patient was suspected of having "Brugada Syndrome"  . Desipramine Other (See Comments)    Patient was suspected of having "Brugada Syndrome"  . Ergonovine Other (See Comments)    Patient was suspected of having "Brugada Syndrome"  . Flecainide Other (See Comments)    Patient was suspected of having "Brugada Syndrome"  . Lithium Other (See Comments)    Patient was suspected of having "Brugada Syndrome"  . Loxapine Other (See Comments)    Patient was suspected of having "Brugada Syndrome"  . Nortriptyline Other (See Comments)    Patient was suspected of having "Brugada Syndrome"  . Oxcarbazepine Other (See Comments)    Patient was suspected of having "Brugada Syndrome"  . Procainamide Other (See Comments)    Patient was suspected of having "Brugada Syndrome"  . Procaine Other (See Comments)    Patient was suspected of having "Brugada Syndrome"  . Propafenone Other (See Comments)    Patient  was suspected of having "Brugada Syndrome"  . Propofol Other (See Comments)    Patient was suspected of having "Brugada Syndrome"  . Trifluoperazine Other (See Comments)    Patient was suspected of having "Brugada Syndrome"

## 2016-05-24 NOTE — Progress Notes (Signed)
Carelink Summary Report / Loop Recorder 

## 2016-05-24 NOTE — Telephone Encounter (Signed)
calling about the results [phone] [name] Wife of the ONO

## 2016-05-26 NOTE — Telephone Encounter (Signed)
lmtcb X1 for [employer].

## 2016-05-26 NOTE — Telephone Encounter (Signed)
If you give me or call me with results I can advice. I am on hospital rotation  Dr. Brand Males, M.D., [facility].C.P Pulmonary and Critical Care Medicine Staff Physician [facility] Pager: [phone], If no answer or between  15:00h - 7:00h: call [phone]  05/26/2016 3:50 PM

## 2016-05-26 NOTE — Telephone Encounter (Signed)
Spoke with MR and discussed results.  Per MR - due to nocturnal desat pt will need 2lpm at night.    Will forward to Triage to call pt.

## 2016-05-26 NOTE — Telephone Encounter (Signed)
Called [facility] to get these results.  Spoke with Corene Cornea, requested this be faxed.  This is being faxed to main fax # [phone]  Left detailed message for Pt to make aware that we are waiting for the results to be received, once received we will call and let them know.   Will send to [facility] to keep an eye out.

## 2016-05-27 LAB — CULTURE, URINE COMPREHENSIVE

## 2016-05-27 LAB — CUP PACEART REMOTE DEVICE CHECK
Date Time Interrogation Session: 20180520200516
MDC IDC PG IMPLANT DT: 20170619

## 2016-05-27 NOTE — Telephone Encounter (Signed)
lmtcb x2 for [employer].

## 2016-05-28 ENCOUNTER — Telehealth: Payer: Self-pay

## 2016-05-28 NOTE — Telephone Encounter (Signed)
[name] Hughs, MD  Toniann Fail C, LPN        Please let patient know that his urine culture from his clinic visit was negative for evidence of infection.  Thank you,  ben    No answer.

## 2016-05-28 NOTE — Telephone Encounter (Signed)
lmomtcb x 3  

## 2016-06-01 ENCOUNTER — Telehealth: Payer: Self-pay | Admitting: Family Medicine

## 2016-06-01 NOTE — Telephone Encounter (Signed)
lmomtcb x 4 for carolyn

## 2016-06-01 NOTE — Telephone Encounter (Signed)
LMOM

## 2016-06-01 NOTE — Telephone Encounter (Signed)
Pt is currently on a viking cruise and has forgotten a medication at home. Did not leave med name on vm. I have called and left her a messages to call back with medication name.

## 2016-06-02 ENCOUNTER — Encounter: Payer: Self-pay | Admitting: *Deleted

## 2016-06-02 NOTE — Telephone Encounter (Signed)
I have mailed a letter to the pt's home address

## 2016-06-02 NOTE — Telephone Encounter (Signed)
No answer. Will send a letter.  

## 2016-06-02 NOTE — Telephone Encounter (Signed)
Closing per protocol 

## 2016-06-09 ENCOUNTER — Encounter: Payer: Self-pay | Admitting: Cardiology

## 2016-06-12 ENCOUNTER — Other Ambulatory Visit: Payer: Self-pay | Admitting: Family Medicine

## 2016-06-15 ENCOUNTER — Other Ambulatory Visit: Payer: Self-pay | Admitting: Family Medicine

## 2016-06-15 NOTE — Telephone Encounter (Signed)
ok 

## 2016-06-15 NOTE — Telephone Encounter (Signed)
Patient is requesting a refill on Hydrocodone - Ok to refill??        

## 2016-06-16 MED ORDER — HYDROCODONE-ACETAMINOPHEN 10-325 MG PO TABS: 1.0000 | ORAL_TABLET | Freq: Four times a day (QID) | ORAL | 0 refills | Status: DC | PRN

## 2016-06-16 NOTE — Telephone Encounter (Signed)
Patient wife [name] [name] called - received letter that we sent regarding results - she can be reached at [phone] -pr

## 2016-06-16 NOTE — Telephone Encounter (Signed)
Results have been explained to patient, pt expressed understanding. Nothing further needed.  Collier Salina, RN  4:13 PM  Note    Spoke with MR and discussed results.  Per MR - due to nocturnal desat pt will need 2lpm at night.    Will forward to Triage to call pt.      Results have been explained to patient wife, expressed understanding.  Requests O2 be ordered through [facility] -- this has been placed and pt wife aware that they will be getting a phone call from [facility].  ----- PFT scheduled on 06/18/16 at [place] scheduled on 06/21/16 at 11:15am with Judson Roch.  Unable to work out appts in the same day.  Nothing further needed.

## 2016-06-16 NOTE — Telephone Encounter (Signed)
Prescription printed and patient made aware to come to office to pick up on 06/16/2016. 

## 2016-06-17 ENCOUNTER — Other Ambulatory Visit: Payer: Self-pay | Admitting: Internal Medicine

## 2016-06-17 ENCOUNTER — Telehealth: Payer: Self-pay | Admitting: Internal Medicine

## 2016-06-17 DIAGNOSIS — J438 Other emphysema: Secondary | ICD-10-CM

## 2016-06-17 DIAGNOSIS — R0602 Shortness of breath: Secondary | ICD-10-CM

## 2016-06-17 NOTE — Telephone Encounter (Signed)
Spoke with wife who verified that pt is doing the [employer]. She also is aware for pt to keep appt with SG. She had no further questions.  Will forward to [place] to ensure we get the results

## 2016-06-17 NOTE — Telephone Encounter (Signed)
O2 order that was placed on 6.13.2018 is outside the 30 day window from pt's ONO. Per the phone note on 05/24/2016 the pt was unable to be reached to discuss results and phone message was closed. Now a new ONO will need to be repeated for pt to get O2.   Called and spoke to pt's wife. Informed her of the need for a new ONO. Order placed. Pt verbalized understanding and denied any further questions or concerns at this time.  Pt has appt to see SG on 06/21/2016.    Will forward to MR as FYI.

## 2016-06-18 ENCOUNTER — Ambulatory Visit (INDEPENDENT_AMBULATORY_CARE_PROVIDER_SITE_OTHER): Payer: Medicare Other | Admitting: Internal Medicine

## 2016-06-18 ENCOUNTER — Encounter: Payer: Self-pay | Admitting: Family Medicine

## 2016-06-18 ENCOUNTER — Ambulatory Visit (INDEPENDENT_AMBULATORY_CARE_PROVIDER_SITE_OTHER): Payer: Medicare Other | Admitting: Family Medicine

## 2016-06-18 VITALS — BP 100/58 | HR 68 | Temp 98.6°F | Resp 20 | Ht 71.0 in | Wt 222.0 lb

## 2016-06-18 DIAGNOSIS — I10 Essential (primary) hypertension: Secondary | ICD-10-CM | POA: Diagnosis not present

## 2016-06-18 DIAGNOSIS — Z79899 Other long term (current) drug therapy: Secondary | ICD-10-CM | POA: Diagnosis not present

## 2016-06-18 DIAGNOSIS — R0602 Shortness of breath: Secondary | ICD-10-CM | POA: Diagnosis not present

## 2016-06-18 DIAGNOSIS — M7989 Other specified soft tissue disorders: Secondary | ICD-10-CM | POA: Diagnosis not present

## 2016-06-18 DIAGNOSIS — I639 Cerebral infarction, unspecified: Secondary | ICD-10-CM | POA: Diagnosis not present

## 2016-06-18 LAB — PULMONARY FUNCTION TEST
DL/VA % pred: 88 %
DL/VA: 4.04 ml/min/mmHg/L
DLCO COR % PRED: 69 %
DLCO COR: 22.08 ml/min/mmHg
DLCO UNC % PRED: 63 %
DLCO UNC: 20.19 ml/min/mmHg
FEF 25-75 POST: 1.42 L/s
FEF 25-75 PRE: 1.11 L/s
FEF2575-%Change-Post: 27 %
FEF2575-%PRED-PRE: 50 %
FEF2575-%Pred-Post: 65 %
FEV1-%Change-Post: 6 %
FEV1-%PRED-POST: 64 %
FEV1-%Pred-Pre: 60 %
FEV1-POST: 1.96 L
FEV1-Pre: 1.83 L
FEV1FVC-%CHANGE-POST: 1 %
FEV1FVC-%PRED-PRE: 95 %
FEV6-%CHANGE-POST: 4 %
FEV6-%PRED-PRE: 67 %
FEV6-%Pred-Post: 70 %
FEV6-POST: 2.74 L
FEV6-Pre: 2.62 L
FEV6FVC-%Change-Post: -1 %
FEV6FVC-%PRED-POST: 105 %
FEV6FVC-%Pred-Pre: 106 %
FVC-%Change-Post: 5 %
FVC-%PRED-PRE: 63 %
FVC-%Pred-Post: 66 %
FVC-POST: 2.77 L
FVC-Pre: 2.63 L
POST FEV6/FVC RATIO: 99 %
PRE FEV1/FVC RATIO: 70 %
Post FEV1/FVC ratio: 71 %
Pre FEV6/FVC Ratio: 100 %
RV % pred: 120 %
RV: 3.05 L
TLC % PRED: 94 %
TLC: 6.58 L

## 2016-06-18 LAB — BASIC METABOLIC PANEL WITH GFR
BUN: 18 mg/dL (ref 7–25)
CO2: 22 mmol/L (ref 20–31)
CREATININE: 1.27 mg/dL — AB (ref 0.70–1.18)
Calcium: 9.3 mg/dL (ref 8.6–10.3)
Chloride: 100 mmol/L (ref 98–110)
GFR, EST AFRICAN AMERICAN: 64 mL/min (ref >=60)
GFR, Est Non African American: 55 mL/min — ABNORMAL LOW (ref >=60)
GLUCOSE: 129 mg/dL — AB (ref 70–99)
Potassium: 4 mmol/L (ref 3.5–5.3)
Sodium: 135 mmol/L (ref 135–146)

## 2016-06-18 MED ORDER — LOSARTAN POTASSIUM 50 MG PO TABS: 50.0000 mg | ORAL_TABLET | Freq: Every day | ORAL | 4 refills | Status: DC

## 2016-06-18 NOTE — Progress Notes (Signed)
Subjective:    Patient ID: [name] [name], male    DOB: 08-Aug-1940, 76 y.o.   MRN: [id number]  HPI Recently went on a [place] river cruise in [place]. Forgot his Effexor. Several days into the trip, he became lethargic, tired, no energy, shortness of breath with minimal activity. He went to the hospital in [place]. Chest x-ray was reportedly clear. Ultrasound of his heart was reportedly normal. He was ruled out for a DVT/pulmonary embolism per his report. Lab work was normal. His Lasix was doubled for 3 days due to leg swelling and his shortness of breath was attributed to that per his report. He is back on his standard dose of Lasix. He denies any chest pain. He denies any significant shortness of breath although he does have his baseline dyspnea with exertion. He continues to endorse fatigue and hypersomnolence. Recently, the patient had a sleep study through his pulmonologist. His wife believes that he may be qualifying for overnight oxygen due to hypoxemia which may help some of his fatigue and hypersomnolence. He continues to have +1 pitting edema in both legs today. He has numerous varicose veins in obvious signs of chronic venous insufficiency Past Medical History:  Diagnosis Date  . Arthritis    s/p TKR  . Bladder cancer (HCC)    [facility] low grade papillary urothileal carcinoma  . BPH (benign prostatic hyperplasia)   . Brugada syndrome    Possible Type II Brugada ECG pattern. No family history of SCD, no syncope, no tachypalpitations.  Marland Kitchen CAD (coronary artery disease)    a. LHC 1/15 - mid LCx 50 and 60%, proximal RCA 50%  . Chronic diastolic CHF (congestive heart failure) ([place])   . CKD (chronic kidney disease)   . COPD (chronic obstructive pulmonary disease) ([place])    Prior heavy smoker. PFTs (3/11): FVC 87%, FEV1 73%, ratio 0.57, DLCO 75%, TLC 121%. Moderate obstructive defect. PFTs (9/15): Only minimal obstruction, sugPrior heavy smoker. PFTs (3/11): FVC 87%, FEV1 73%, ratio 0.57, DLCO  75%, TLC 121%. Moderate obstructive defect. PFTs (9/15) minimal obstruction, poss asthma component   . Depression    with bipolar tendencies  . DOE (dyspnea on exertion)    a. Myoview 8/09- EF 57%, no ischemia // b. Myoview (3/11) EF 68%, diaphragmatic attenuation, no ischemia //  c Echo (4/11) EF 03-55%, mild diastolic dysfunction, PASP 38 mmHg // d. PFTs 9/15 minimal obstruction  /  e. CT negative for ILD  . GERD (gastroesophageal reflux disease)   . History of Doppler ultrasound    Carotid US 3/11 negative for significant stenosis  //  carotid US 6/17: Mild bilateral plaque, 1-39% ICA  . History of echocardiogram    a. Echo 12/14 Inferior and distal septal HK, mild LVH, EF 50-55%, mild MR, mild LAE  //  b. Echo 6/17: Mild focal basal septal hypertrophy, EF 60-65%, normal wall motion, grade 1 diastolic dysfunction, mild AI  . HLD (hyperlipidemia)   . Hypertensive heart disease with CHF (congestive heart failure) ([place]) 04/08/2009  . Low testosterone   . Lung nodule    a. CT in 2015 >> PET in 12/15 not sugg of malignancy   . LVH (left ventricular hypertrophy)    a. Echo 12/14 Inferior and distal septal HK, mild LVH, EF 50-55%, mild MR, mild LAE  . Mild dementia   . Mitral regurgitation    Echo (4/11) with PISA ERO 0.3 cm^2 and regurgitant volume 41 mL (moderate MR). Echo (12/14) with mild MR.   Marland Kitchen  MS (multiple sclerosis) ([place])   . MS (multiple sclerosis) ([place])   . Right bundle branch block   . Stroke ([place])   . Thoracic aortic aneurysm (HCC)    4.1 cm 2017  . Urinary retention with incomplete bladder emptying    receives botox injections and treatment for BPH through [facility]   Past Surgical History:  Procedure Laterality Date  . APPENDECTOMY    . EP IMPLANTABLE DEVICE N/A 06/23/2015   Procedure: Loop Recorder Insertion;  Surgeon: Deboraha Sprang, MD;  Location: [facility];  Service: Cardiovascular;  Laterality: N/A;  . HEMORRHOID SURGERY    . LEFT HEART CATHETERIZATION WITH  CORONARY ANGIOGRAM N/A 01/05/2013   Procedure: LEFT HEART CATHETERIZATION WITH CORONARY ANGIOGRAM;  Surgeon: Larey Dresser, MD;  Location: [facility] CATH LAB;  Service: Cardiovascular;  Laterality: N/A;  . ROTATOR CUFF REPAIR    . TEE WITHOUT CARDIOVERSION N/A 06/23/2015   Procedure: TRANSESOPHAGEAL ECHOCARDIOGRAM (TEE);  Surgeon: Fay Records, MD;  Location: [facility] ENDOSCOPY;  Service: Cardiovascular;  Laterality: N/A;  . TONSILLECTOMY    . TOTAL KNEE ARTHROPLASTY Right   . TRANSURETHRAL RESECTION OF PROSTATE     Current Outpatient Prescriptions on File Prior to Visit  Medication Sig Dispense Refill  . acetaminophen (TYLENOL) 325 MG tablet Take 650 mg by mouth every 6 (six) hours as needed (for pain or headaches).     Marland Kitchen amantadine (SYMMETREL) 100 MG capsule TAKE 1 CAPSULE BY MOUTH TWICE DAILY WITH BREAKFAST AND LUNCH 180 capsule 2  . amLODipine (NORVASC) 10 MG tablet Take 1 tablet (10 mg total) by mouth daily. 90 tablet 3  . atorvastatin (LIPITOR) 40 MG tablet Take 1 tablet (40 mg total) by mouth daily. 90 tablet 3  . bisoprolol (ZEBETA) 10 MG tablet Take 1 tablet (10 mg total) by mouth daily. 90 tablet 3  . cholecalciferol (VITAMIN D) 400 units TABS tablet Take 400 Units by mouth daily.     . Cyanocobalamin (B-12) 1000 MCG CAPS Take 1,000 mcg by mouth daily.     Marland Kitchen dipyridamole-aspirin (AGGRENOX) 200-25 MG 12hr capsule Take 1 capsule by mouth 2 (two) times daily. 180 capsule 1  . doxazosin (CARDURA) 2 MG tablet Take 1 tablet (2 mg total) by mouth daily. 30 tablet 2  . folic acid (FOLVITE) 1 MG tablet Take 1 mg by mouth daily.    . furosemide (LASIX) 40 MG tablet Take 40 mg by mouth daily.    Marland Kitchen HYDROcodone-acetaminophen (NORCO) 10-325 MG tablet Take 1 tablet by mouth every 6 (six) hours as needed for moderate pain or severe pain. 100 tablet 0  . MAGNESIUM CITRATE PO Take 0.5-1 Bottles by mouth once as needed (for mild constipation).     . pantoprazole (PROTONIX) 40 MG tablet Take 1 tablet (40 mg total)  by mouth daily. 90 tablet 3  . PROAIR HFA 108 (90 Base) MCG/ACT inhaler USE 1 TO 2 INHALATIONS EVERY 6 HOURS AS NEEDED FOR WHEEZING OR SHORTNESS OF BREATH 25.5 g 4  . rOPINIRole (REQUIP) 1 MG tablet TAKE 1 TABLET AT BEDTIME 90 tablet 3  . SPIRIVA HANDIHALER 18 MCG inhalation capsule INHALE THE CONTENTS OF 1 CAPSULE WITH 2         INHALATIONS ONCE DAILY AS DIRECTED 1 capsule 11  . venlafaxine XR (EFFEXOR-XR) 150 MG 24 hr capsule TAKE 1 CAPSULE TWICE A DAY 180 capsule 3   No current facility-administered medications on file prior to visit.    Allergies  Allergen Reactions  .  Acetylcholine     Patient was suspected of having "Brugada Syndrome": (BrS) is a genetic condition that results in abnormal electrical activity within the heart, increasing the risk of sudden cardiac death. Those affected may have episodes of passing out.  Marland Kitchen Alcohol-Sulfur [Sulfur] Other (See Comments)    Patient was suspected of having "Brugada Syndrome"  . Amitriptyline Other (See Comments)    Patient was suspected of having "Brugada Syndrome"  . Bupivacaine Other (See Comments)    Patient was suspected of having "Brugada Syndrome"  . Clomipramine Hcl Other (See Comments)    Patient was suspected of having "Brugada Syndrome"  . Cocaine Other (See Comments)    Patient was suspected of having "Brugada Syndrome"  . Desipramine Other (See Comments)    Patient was suspected of having "Brugada Syndrome"  . Ergonovine Other (See Comments)    Patient was suspected of having "Brugada Syndrome"  . Flecainide Other (See Comments)    Patient was suspected of having "Brugada Syndrome"  . Lithium Other (See Comments)    Patient was suspected of having "Brugada Syndrome"  . Loxapine Other (See Comments)    Patient was suspected of having "Brugada Syndrome"  . Nortriptyline Other (See Comments)    Patient was suspected of having "Brugada Syndrome"  . Oxcarbazepine Other (See Comments)    Patient was suspected of having  "Brugada Syndrome"  . Procainamide Other (See Comments)    Patient was suspected of having "Brugada Syndrome"  . Procaine Other (See Comments)    Patient was suspected of having "Brugada Syndrome"  . Propafenone Other (See Comments)    Patient was suspected of having "Brugada Syndrome"  . Propofol Other (See Comments)    Patient was suspected of having "Brugada Syndrome"  . Trifluoperazine Other (See Comments)    Patient was suspected of having "Brugada Syndrome"   Social History   Social History  . Marital status: Married    Spouse name: N/A  . Number of children: N/A  . Years of education: N/A   Occupational History  . retired Retired   Social History Main Topics  . Smoking status: Former Smoker    Packs/day: 2.00    Years: 40.00    Types: Cigarettes    Quit date: 01/04/1993  . Smokeless tobacco: Former Systems developer    Types: Chew     Comment: patient still smokes a pipe daily  . Alcohol use 15.0 oz/week    25 Cans of beer per week     Comment: 12 pack a week  . Drug use: No  . Sexual activity: Not on file   Other Topics Concern  . Not on file   Social History Narrative   Retired from the [employer] after 20 years   [place] Veteran      Review of Systems  All other systems reviewed and are negative.      Objective:   Physical Exam  Constitutional: He appears well-developed and well-nourished.  Neck: No JVD present.  Cardiovascular: Normal rate, regular rhythm, normal heart sounds and intact distal pulses.   No murmur heard. Pulmonary/Chest: Effort normal and breath sounds normal. No respiratory distress. He has no wheezes. He has no rales.  Abdominal: Soft. Bowel sounds are normal.  Musculoskeletal: He exhibits edema.  Skin: Rash noted.  Vitals reviewed.         Assessment & Plan:  Leg swelling - Plan: BASIC METABOLIC PANEL WITH GFR  Essential hypertension  Polypharmacy  Given his chronic kidney disease,  I would not indefinitely increase his  Lasix. Continue his current dose and check a BMP to monitor his renal function. However I will discontinue his amlodipine as this can be contributing some to his leg swelling. Furthermore his blood pressure appears not to require it as it is actually low today. Hopefully this will reduce some of the polypharmacy as well. He will need to monitor his blood pressure at home. I will be interested to see how the patient performs on his overnight sleep study as I believe some of his hypersomnolence may improve with oxygen at night particularly if he has sleep apnea.

## 2016-06-18 NOTE — Progress Notes (Signed)
PFT completed today 06/18/16.

## 2016-06-20 ENCOUNTER — Encounter: Payer: Self-pay | Admitting: Internal Medicine

## 2016-06-20 DIAGNOSIS — R0902 Hypoxemia: Secondary | ICD-10-CM | POA: Diagnosis not present

## 2016-06-21 ENCOUNTER — Encounter: Payer: Self-pay | Admitting: Acute Care

## 2016-06-21 ENCOUNTER — Emergency Department (HOSPITAL_COMMUNITY): Payer: Medicare Other

## 2016-06-21 ENCOUNTER — Ambulatory Visit: Payer: Medicare Other | Admitting: Acute Care

## 2016-06-21 ENCOUNTER — Encounter (HOSPITAL_COMMUNITY): Payer: Self-pay | Admitting: Emergency Medicine

## 2016-06-21 ENCOUNTER — Other Ambulatory Visit: Payer: Self-pay

## 2016-06-21 ENCOUNTER — Telehealth: Payer: Self-pay | Admitting: Family Medicine

## 2016-06-21 ENCOUNTER — Observation Stay (HOSPITAL_COMMUNITY): Payer: Medicare Other

## 2016-06-21 ENCOUNTER — Inpatient Hospital Stay (HOSPITAL_COMMUNITY)
Admission: EM | Admit: 2016-06-21 | Discharge: 2016-06-23 | DRG: 065 | Disposition: A | Discharge status: Home / Self Care | Payer: Medicare Other | Attending: Internal Medicine | Admitting: Internal Medicine

## 2016-06-21 DIAGNOSIS — I63412 Cerebral infarction due to embolism of left middle cerebral artery: Secondary | ICD-10-CM | POA: Diagnosis not present

## 2016-06-21 DIAGNOSIS — I10 Essential (primary) hypertension: Secondary | ICD-10-CM

## 2016-06-21 DIAGNOSIS — G35 Multiple sclerosis: Secondary | ICD-10-CM | POA: Diagnosis present

## 2016-06-21 DIAGNOSIS — Z87440 Personal history of urinary (tract) infections: Secondary | ICD-10-CM

## 2016-06-21 DIAGNOSIS — E669 Obesity, unspecified: Secondary | ICD-10-CM | POA: Diagnosis present

## 2016-06-21 DIAGNOSIS — R4701 Aphasia: Secondary | ICD-10-CM

## 2016-06-21 DIAGNOSIS — R488 Other symbolic dysfunctions: Secondary | ICD-10-CM | POA: Diagnosis present

## 2016-06-21 DIAGNOSIS — I440 Atrioventricular block, first degree: Secondary | ICD-10-CM | POA: Diagnosis present

## 2016-06-21 DIAGNOSIS — Z9079 Acquired absence of other genital organ(s): Secondary | ICD-10-CM

## 2016-06-21 DIAGNOSIS — R4182 Altered mental status, unspecified: Secondary | ICD-10-CM | POA: Diagnosis not present

## 2016-06-21 DIAGNOSIS — N390 Urinary tract infection, site not specified: Secondary | ICD-10-CM | POA: Diagnosis present

## 2016-06-21 DIAGNOSIS — Z8551 Personal history of malignant neoplasm of bladder: Secondary | ICD-10-CM

## 2016-06-21 DIAGNOSIS — Z8673 Personal history of transient ischemic attack (TIA), and cerebral infarction without residual deficits: Secondary | ICD-10-CM

## 2016-06-21 DIAGNOSIS — N182 Chronic kidney disease, stage 2 (mild): Secondary | ICD-10-CM | POA: Diagnosis present

## 2016-06-21 DIAGNOSIS — Z6830 Body mass index (BMI) 30.0-30.9, adult: Secondary | ICD-10-CM

## 2016-06-21 DIAGNOSIS — I5032 Chronic diastolic (congestive) heart failure: Secondary | ICD-10-CM | POA: Diagnosis not present

## 2016-06-21 DIAGNOSIS — R911 Solitary pulmonary nodule: Secondary | ICD-10-CM | POA: Diagnosis present

## 2016-06-21 DIAGNOSIS — B9620 Unspecified Escherichia coli [E. coli] as the cause of diseases classified elsewhere: Secondary | ICD-10-CM | POA: Diagnosis present

## 2016-06-21 DIAGNOSIS — C689 Malignant neoplasm of urinary organ, unspecified: Secondary | ICD-10-CM | POA: Diagnosis present

## 2016-06-21 DIAGNOSIS — I712 Thoracic aortic aneurysm, without rupture: Secondary | ICD-10-CM | POA: Diagnosis present

## 2016-06-21 DIAGNOSIS — R29810 Facial weakness: Secondary | ICD-10-CM | POA: Diagnosis present

## 2016-06-21 DIAGNOSIS — R4781 Slurred speech: Secondary | ICD-10-CM | POA: Diagnosis present

## 2016-06-21 DIAGNOSIS — Z713 Dietary counseling and surveillance: Secondary | ICD-10-CM

## 2016-06-21 DIAGNOSIS — C680 Malignant neoplasm of urethra: Secondary | ICD-10-CM

## 2016-06-21 DIAGNOSIS — F32A Depression, unspecified: Secondary | ICD-10-CM | POA: Diagnosis present

## 2016-06-21 DIAGNOSIS — Z881 Allergy status to other antibiotic agents status: Secondary | ICD-10-CM

## 2016-06-21 DIAGNOSIS — I498 Other specified cardiac arrhythmias: Secondary | ICD-10-CM | POA: Diagnosis present

## 2016-06-21 DIAGNOSIS — I639 Cerebral infarction, unspecified: Secondary | ICD-10-CM | POA: Diagnosis present

## 2016-06-21 DIAGNOSIS — F329 Major depressive disorder, single episode, unspecified: Secondary | ICD-10-CM | POA: Diagnosis present

## 2016-06-21 DIAGNOSIS — I340 Nonrheumatic mitral (valve) insufficiency: Secondary | ICD-10-CM | POA: Diagnosis present

## 2016-06-21 DIAGNOSIS — I6340 Cerebral infarction due to embolism of unspecified cerebral artery: Secondary | ICD-10-CM | POA: Diagnosis present

## 2016-06-21 DIAGNOSIS — R339 Retention of urine, unspecified: Secondary | ICD-10-CM | POA: Diagnosis not present

## 2016-06-21 DIAGNOSIS — Z96651 Presence of right artificial knee joint: Secondary | ICD-10-CM | POA: Diagnosis present

## 2016-06-21 DIAGNOSIS — R338 Other retention of urine: Secondary | ICD-10-CM | POA: Diagnosis present

## 2016-06-21 DIAGNOSIS — Z79899 Other long term (current) drug therapy: Secondary | ICD-10-CM

## 2016-06-21 DIAGNOSIS — J449 Chronic obstructive pulmonary disease, unspecified: Secondary | ICD-10-CM | POA: Diagnosis present

## 2016-06-21 DIAGNOSIS — I130 Hypertensive heart and chronic kidney disease with heart failure and stage 1 through stage 4 chronic kidney disease, or unspecified chronic kidney disease: Secondary | ICD-10-CM | POA: Diagnosis present

## 2016-06-21 DIAGNOSIS — Z885 Allergy status to narcotic agent status: Secondary | ICD-10-CM

## 2016-06-21 DIAGNOSIS — R0902 Hypoxemia: Secondary | ICD-10-CM | POA: Diagnosis not present

## 2016-06-21 DIAGNOSIS — I2510 Atherosclerotic heart disease of native coronary artery without angina pectoris: Secondary | ICD-10-CM | POA: Diagnosis not present

## 2016-06-21 DIAGNOSIS — N401 Enlarged prostate with lower urinary tract symptoms: Secondary | ICD-10-CM | POA: Diagnosis present

## 2016-06-21 DIAGNOSIS — F0390 Unspecified dementia without behavioral disturbance: Secondary | ICD-10-CM | POA: Diagnosis present

## 2016-06-21 DIAGNOSIS — K219 Gastro-esophageal reflux disease without esophagitis: Secondary | ICD-10-CM | POA: Diagnosis present

## 2016-06-21 DIAGNOSIS — E785 Hyperlipidemia, unspecified: Secondary | ICD-10-CM | POA: Diagnosis present

## 2016-06-21 DIAGNOSIS — Z823 Family history of stroke: Secondary | ICD-10-CM

## 2016-06-21 DIAGNOSIS — Z888 Allergy status to other drugs, medicaments and biological substances status: Secondary | ICD-10-CM

## 2016-06-21 DIAGNOSIS — R001 Bradycardia, unspecified: Secondary | ICD-10-CM | POA: Diagnosis present

## 2016-06-21 LAB — URINALYSIS, ROUTINE W REFLEX MICROSCOPIC
BILIRUBIN URINE: NEGATIVE
GLUCOSE, UA: NEGATIVE mg/dL
Hgb urine dipstick: NEGATIVE
KETONES UR: NEGATIVE mg/dL
NITRITE: NEGATIVE
PH: 6 (ref 5.0–8.0)
Protein, ur: NEGATIVE mg/dL
SPECIFIC GRAVITY, URINE: 1.013 (ref 1.005–1.030)

## 2016-06-21 LAB — PROTIME-INR
INR: 0.98
Prothrombin Time: 13 seconds (ref 11.4–15.2)

## 2016-06-21 LAB — CBC
HEMATOCRIT: 42.3 % (ref 39.0–52.0)
Hemoglobin: 13.9 g/dL (ref 13.0–17.0)
MCH: 32.3 pg (ref 26.0–34.0)
MCHC: 32.9 g/dL (ref 30.0–36.0)
MCV: 98.4 fL (ref 78.0–100.0)
PLATELETS: 312 10*3/uL (ref 150–400)
RBC: 4.3 MIL/uL (ref 4.22–5.81)
RDW: 13.7 % (ref 11.5–15.5)
WBC: 7.8 10*3/uL (ref 4.0–10.5)

## 2016-06-21 LAB — I-STAT CHEM 8, ED
BUN: 14 mg/dL (ref 6–20)
CHLORIDE: 99 mmol/L — AB (ref 101–111)
Calcium, Ion: 1.17 mmol/L (ref 1.15–1.40)
Creatinine, Ser: 1.1 mg/dL (ref 0.61–1.24)
Glucose, Bld: 93 mg/dL (ref 65–99)
HEMATOCRIT: 42 % (ref 39.0–52.0)
HEMOGLOBIN: 14.3 g/dL (ref 13.0–17.0)
POTASSIUM: 4.7 mmol/L (ref 3.5–5.1)
SODIUM: 136 mmol/L (ref 135–145)
TCO2: 27 mmol/L (ref 0–100)

## 2016-06-21 LAB — COMPREHENSIVE METABOLIC PANEL
ALT: 16 U/L — AB (ref 17–63)
ANION GAP: 8 (ref 5–15)
AST: 22 U/L (ref 15–41)
Albumin: 4 g/dL (ref 3.5–5.0)
Alkaline Phosphatase: 76 U/L (ref 38–126)
BUN: 13 mg/dL (ref 6–20)
CHLORIDE: 101 mmol/L (ref 101–111)
CO2: 26 mmol/L (ref 22–32)
Calcium: 9 mg/dL (ref 8.9–10.3)
Creatinine, Ser: 1.17 mg/dL (ref 0.61–1.24)
GFR calc Af Amer: >60 mL/min (ref >60)
GFR calc non Af Amer: 60 mL/min — ABNORMAL LOW (ref >60)
Glucose, Bld: 97 mg/dL (ref 65–99)
POTASSIUM: 4.6 mmol/L (ref 3.5–5.1)
SODIUM: 135 mmol/L (ref 135–145)
Total Bilirubin: 0.6 mg/dL (ref 0.3–1.2)
Total Protein: 7.1 g/dL (ref 6.5–8.1)

## 2016-06-21 LAB — DIFFERENTIAL
BASOS PCT: 1 %
Basophils Absolute: 0.1 10*3/uL (ref 0.0–0.1)
EOS ABS: 0.5 10*3/uL (ref 0.0–0.7)
EOS PCT: 7 %
Lymphocytes Relative: 18 %
Lymphs Abs: 1.4 10*3/uL (ref 0.7–4.0)
MONO ABS: 0.7 10*3/uL (ref 0.1–1.0)
Monocytes Relative: 9 %
NEUTROS ABS: 5.1 10*3/uL (ref 1.7–7.7)
Neutrophils Relative %: 65 %

## 2016-06-21 LAB — APTT: APTT: 29 s (ref 24–36)

## 2016-06-21 LAB — I-STAT TROPONIN, ED: Troponin i, poc: 0 ng/mL (ref 0.00–0.08)

## 2016-06-21 MED ORDER — CHOLECALCIFEROL 10 MCG (400 UNIT) PO TABS
400.0000 [IU] | ORAL_TABLET | Freq: Every day | ORAL | Status: DC
  Administered 2016-06-22 – 2016-06-23 (×2): 400 [IU] via ORAL
  Filled 2016-06-21 (×2): qty 1

## 2016-06-21 MED ORDER — ACETAMINOPHEN 650 MG RE SUPP: 650.0000 mg | RECTAL | Status: DC | PRN

## 2016-06-21 MED ORDER — FOLIC ACID 1 MG PO TABS
1.0000 mg | ORAL_TABLET | Freq: Every day | ORAL | Status: DC
  Administered 2016-06-22 – 2016-06-23 (×2): 1 mg via ORAL
  Filled 2016-06-21 (×2): qty 1

## 2016-06-21 MED ORDER — VENLAFAXINE HCL ER 75 MG PO CP24
150.0000 mg | ORAL_CAPSULE | Freq: Two times a day (BID) | ORAL | Status: DC
  Administered 2016-06-21 – 2016-06-23 (×4): 150 mg via ORAL
  Filled 2016-06-21 (×4): qty 2

## 2016-06-21 MED ORDER — DRY EYE FORMULA PO CAPS: 1.0000 [drp] | ORAL_CAPSULE | Freq: Four times a day (QID) | ORAL | Status: DC | PRN

## 2016-06-21 MED ORDER — VITAMIN B-12 1000 MCG PO TABS
1000.0000 ug | ORAL_TABLET | Freq: Every day | ORAL | Status: DC
Start: 2016-06-22 — End: 2016-06-23
  Administered 2016-06-22 – 2016-06-23 (×2): 1000 ug via ORAL
  Filled 2016-06-21 (×2): qty 1

## 2016-06-21 MED ORDER — ACETAMINOPHEN 160 MG/5ML PO SOLN: 650.0000 mg | ORAL | Status: DC | PRN

## 2016-06-21 MED ORDER — FUROSEMIDE 20 MG PO TABS
40.0000 mg | ORAL_TABLET | Freq: Every day | ORAL | Status: DC
  Administered 2016-06-22 – 2016-06-23 (×2): 40 mg via ORAL
  Filled 2016-06-21 (×2): qty 2

## 2016-06-21 MED ORDER — ASPIRIN-DIPYRIDAMOLE ER 25-200 MG PO CP12
1.0000 | ORAL_CAPSULE | Freq: Two times a day (BID) | ORAL | Status: DC
  Administered 2016-06-21 – 2016-06-23 (×4): 1 via ORAL
  Filled 2016-06-21 (×4): qty 1

## 2016-06-21 MED ORDER — TIOTROPIUM BROMIDE MONOHYDRATE 18 MCG IN CAPS
18.0000 ug | ORAL_CAPSULE | Freq: Every day | RESPIRATORY_TRACT | Status: DC
  Administered 2016-06-22 – 2016-06-23 (×2): 18 ug via RESPIRATORY_TRACT
  Filled 2016-06-21: qty 5

## 2016-06-21 MED ORDER — AMANTADINE HCL 100 MG PO CAPS
100.0000 mg | ORAL_CAPSULE | Freq: Two times a day (BID) | ORAL | Status: DC
  Administered 2016-06-22 – 2016-06-23 (×4): 100 mg via ORAL
  Filled 2016-06-21 (×4): qty 1

## 2016-06-21 MED ORDER — ACETAMINOPHEN 325 MG PO TABS: 650.0000 mg | ORAL_TABLET | ORAL | Status: DC | PRN

## 2016-06-21 MED ORDER — PANTOPRAZOLE SODIUM 40 MG PO TBEC
40.0000 mg | DELAYED_RELEASE_TABLET | Freq: Every day | ORAL | Status: DC
  Administered 2016-06-22 – 2016-06-23 (×2): 40 mg via ORAL
  Filled 2016-06-21 (×2): qty 1

## 2016-06-21 MED ORDER — MAGNESIUM CITRATE PO SOLN: 0.5000 | Freq: Once | ORAL | Status: DC | PRN

## 2016-06-21 MED ORDER — STROKE: EARLY STAGES OF RECOVERY BOOK
Freq: Once | Status: AC
  Administered 2016-06-21: 22:00:00
  Filled 2016-06-21: qty 1

## 2016-06-21 MED ORDER — ALBUTEROL SULFATE (2.5 MG/3ML) 0.083% IN NEBU: 3.0000 mL | INHALATION_SOLUTION | RESPIRATORY_TRACT | Status: DC | PRN

## 2016-06-21 MED ORDER — VENLAFAXINE HCL ER 150 MG PO CP24: 150.0000 mg | ORAL_CAPSULE | Freq: Every day | ORAL | Status: DC

## 2016-06-21 MED ORDER — HYDROCODONE-ACETAMINOPHEN 10-325 MG PO TABS
1.0000 | ORAL_TABLET | Freq: Four times a day (QID) | ORAL | Status: DC | PRN
Start: 2016-06-21 — End: 2016-06-23

## 2016-06-21 MED ORDER — ENOXAPARIN SODIUM 40 MG/0.4ML ~~LOC~~ SOLN
40.0000 mg | SUBCUTANEOUS | Status: DC
  Administered 2016-06-21 – 2016-06-22 (×2): 40 mg via SUBCUTANEOUS
  Filled 2016-06-21 (×2): qty 0.4

## 2016-06-21 MED ORDER — ROPINIROLE HCL 1 MG PO TABS
1.0000 mg | ORAL_TABLET | Freq: Every day | ORAL | Status: DC
  Administered 2016-06-21 – 2016-06-22 (×2): 1 mg via ORAL
  Filled 2016-06-21 (×2): qty 1

## 2016-06-21 MED ORDER — DOXAZOSIN MESYLATE 2 MG PO TABS
2.0000 mg | ORAL_TABLET | Freq: Every day | ORAL | Status: DC
  Administered 2016-06-22 – 2016-06-23 (×2): 2 mg via ORAL
  Filled 2016-06-21 (×2): qty 1

## 2016-06-21 MED ORDER — ATORVASTATIN CALCIUM 40 MG PO TABS
40.0000 mg | ORAL_TABLET | Freq: Every day | ORAL | Status: DC
  Administered 2016-06-22 – 2016-06-23 (×2): 40 mg via ORAL
  Filled 2016-06-21 (×2): qty 1

## 2016-06-21 NOTE — Consult Note (Signed)
Referring Physician: Dr. Myna Hidalgo    Chief Complaint: Slurred speech  HPI: [name] [name] is an 76 y.o. male who presents with slurred speech and "AMS" from home. LKN was Sunday night at 2100. Symptoms were first noticed this morning. On awakening, he was having difficulty completing sentences, could not state his birth date, the year or other items that his wife asked him to recollect. At time of initial ED presentation, his wife stated that he was improving. A "left facial droop" was noted in triage.   He has had 2 strokes in the past, most recently in April of this year. His PMHx also includes CAD, BPH s/p TURP, CKD, COPD and multiple sclerosis. He self-catheterizes 3 times per day due to urinary retention from his MS and BPH.  Past Medical History:  Diagnosis Date  . Arthritis    s/p TKR  . Bladder cancer (HCC)    [facility], [facility] low grade papillary urothileal carcinoma  . BPH (benign prostatic hyperplasia)   . Brugada syndrome    Possible Type II Brugada ECG pattern. No family history of SCD, no syncope, no tachypalpitations.  Marland Kitchen CAD (coronary artery disease)    a. LHC 1/15 - mid LCx 50 and 60%, proximal RCA 50%  . Chronic diastolic CHF (congestive heart failure) ([place])   . CKD (chronic kidney disease)   . COPD (chronic obstructive pulmonary disease) ([place])    Prior heavy smoker. PFTs (3/11): FVC 87%, FEV1 73%, ratio 0.57, DLCO 75%, TLC 121%. Moderate obstructive defect. PFTs (9/15): Only minimal obstruction, sugPrior heavy smoker. PFTs (3/11): FVC 87%, FEV1 73%, ratio 0.57, DLCO 75%, TLC 121%. Moderate obstructive defect. PFTs (9/15) minimal obstruction, poss asthma component   . Depression    with bipolar tendencies  . DOE (dyspnea on exertion)    a. Myoview 8/09- EF 57%, no ischemia // b. Myoview (3/11) EF 68%, diaphragmatic attenuation, no ischemia //  c Echo (4/11) EF 64-33%, mild diastolic dysfunction, PASP 38 mmHg // d. PFTs 9/15 minimal obstruction  /  e. CT negative for ILD  . GERD  (gastroesophageal reflux disease)   . History of Doppler ultrasound    Carotid US 3/11 negative for significant stenosis  //  carotid US 6/17: Mild bilateral plaque, 1-39% ICA  . History of echocardiogram    a. Echo 12/14 Inferior and distal septal HK, mild LVH, EF 50-55%, mild MR, mild LAE  //  b. Echo 6/17: Mild focal basal septal hypertrophy, EF 60-65%, normal wall motion, grade 1 diastolic dysfunction, mild AI  . HLD (hyperlipidemia)   . Hypertensive heart disease with CHF (congestive heart failure) ([place]) 04/08/2009  . Low testosterone   . Lung nodule    a. CT in 2015 >> PET in 12/15 not sugg of malignancy   . LVH (left ventricular hypertrophy)    a. Echo 12/14 Inferior and distal septal HK, mild LVH, EF 50-55%, mild MR, mild LAE  . Mild dementia   . Mitral regurgitation    Echo (4/11) with PISA ERO 0.3 cm^2 and regurgitant volume 41 mL (moderate MR). Echo (12/14) with mild MR.   . MS (multiple sclerosis) ([place])   . MS (multiple sclerosis) ([place])   . Right bundle branch block   . Stroke ([place])   . Thoracic aortic aneurysm (HCC)    4.1 cm 2017  . Urinary retention with incomplete bladder emptying    receives botox injections and treatment for BPH through [facility]    Past Surgical History:  Procedure Laterality  Date  . APPENDECTOMY    . EP IMPLANTABLE DEVICE N/A 06/23/2015   Procedure: Loop Recorder Insertion;  Surgeon: Deboraha Sprang, MD;  Location: [place] CV LAB;  Service: Cardiovascular;  Laterality: N/A;  . HEMORRHOID SURGERY    . LEFT HEART CATHETERIZATION WITH CORONARY ANGIOGRAM N/A 01/05/2013   Procedure: LEFT HEART CATHETERIZATION WITH CORONARY ANGIOGRAM;  Surgeon: Larey Dresser, MD;  Location[facility] CATH LAB;  Service: Cardiovascular;  Laterality: N/A;  . ROTATOR CUFF REPAIR    . TEE WITHOUT CARDIOVERSION N/A 06/23/2015   Procedure: TRANSESOPHAGEAL ECHOCARDIOGRAM (TEE);  Surgeon: Fay Records, MD;  Location: [facility] ENDOSCOPY;  Service: Cardiovascular;  Laterality: N/A;  .  TONSILLECTOMY    . TOTAL KNEE ARTHROPLASTY Right   . TRANSURETHRAL RESECTION OF PROSTATE      Family History  Problem Relation Age of Onset  . Stroke Father   . Stroke Mother   . Hypertension Sister   . Kidney cancer Brother   . Arthritis Brother   . Other Brother        knee problems, Bil TKR  . Hypertension Unknown        family history  . Prostate cancer Neg Hx   . Bladder Cancer Neg Hx    Social History:  reports that he quit smoking about 23 years ago. His smoking use included Cigarettes. He has a 80.00 pack-year smoking history. He has quit using smokeless tobacco. His smokeless tobacco use included Chew. He reports that he drinks about 15.0 oz of alcohol per week . He reports that he does not use drugs.  Allergies:  Allergies  Allergen Reactions  . Acetylcholine     Patient was suspected of having "Brugada Syndrome": (BrS) is a genetic condition that results in abnormal electrical activity within the heart, increasing the risk of sudden cardiac death. Those affected may have episodes of passing out.  Marland Kitchen Alcohol-Sulfur [Sulfur] Other (See Comments)    Patient was suspected of having "Brugada Syndrome"  . Amitriptyline Other (See Comments)    Patient was suspected of having "Brugada Syndrome"  . Bupivacaine Other (See Comments)    Patient was suspected of having "Brugada Syndrome"  . Clomipramine Hcl Other (See Comments)    Patient was suspected of having "Brugada Syndrome"  . Cocaine Other (See Comments)    Patient was suspected of having "Brugada Syndrome"  . Desipramine Other (See Comments)    Patient was suspected of having "Brugada Syndrome"  . Ergonovine Other (See Comments)    Patient was suspected of having "Brugada Syndrome"  . Flecainide Other (See Comments)    Patient was suspected of having "Brugada Syndrome"  . Lithium Other (See Comments)    Patient was suspected of having "Brugada Syndrome"  . Loxapine Other (See Comments)    Patient was suspected of  having "Brugada Syndrome"  . Nortriptyline Other (See Comments)    Patient was suspected of having "Brugada Syndrome"  . Oxcarbazepine Other (See Comments)    Patient was suspected of having "Brugada Syndrome"  . Procainamide Other (See Comments)    Patient was suspected of having "Brugada Syndrome"  . Procaine Other (See Comments)    Patient was suspected of having "Brugada Syndrome"  . Propafenone Other (See Comments)    Patient was suspected of having "Brugada Syndrome"  . Propofol Other (See Comments)    Patient was suspected of having "Brugada Syndrome"  . Trifluoperazine Other (See Comments)    Patient was suspected of having "Brugada Syndrome"  Home Medications:  acetaminophen (TYLENOL) 325 MG tablet Take 650 mg by mouth every 6 (six) hours as needed (for pain or headaches).  Opyd, Ilene Qua, MD Not Ordered  amantadine (SYMMETREL) 100 MG capsule TAKE 1 CAPSULE BY MOUTH TWICE DAILY WITH BREAKFAST AND LUNCH Opyd, Ilene Qua, MD Reordered  Orderedas:amantadine (SYMMETREL) capsule 100 mg - 100 mg, Oral, 2 times daily, First dose on Tue 06/22/16 at 0800  atorvastatin (LIPITOR) 40 MG tablet Take 1 tablet (40 mg total) by mouth daily. Vianne Bulls, MD Reordered  Orderedas:atorvastatin (LIPITOR) tablet 40 mg - 40 mg, Oral, Daily-1800, First dose on Tue 06/22/16 at 1800  bisoprolol (ZEBETA) 10 MG tablet Take 1 tablet (10 mg total) by mouth daily. Vianne Bulls, MD Not Ordered  cholecalciferol (VITAMIN D) 400 units TABS tablet Take 400 Units by mouth daily.  Vianne Bulls, MD Reordered  Orderedas:cholecalciferol (VITAMIN D) tablet 400 Units - 400 Units, Oral, Daily, First dose on Tue 06/22/16 at 1000  Cyanocobalamin (B-12) 1000 MCG CAPS Take 1,000 mcg by mouth daily.  Vianne Bulls, MD Reordered  Orderedas:vitamin B-12 (CYANOCOBALAMIN) tablet 1,000 mcg - 1,000 mcg, Oral, Daily, First dose on Tue 06/22/16 at 1000  dipyridamole-aspirin (AGGRENOX) 200-25 MG 12hr capsule Take 1  capsule by mouth 2 (two) times daily. Vianne Bulls, MD Reordered  Orderedas:dipyridamole-aspirin [facility]) 200-25 MG per 12 hr capsule 1 capsule - 1 capsule, Oral, 2 times daily, First dose on Mon 06/21/16 at 2200  doxazosin (CARDURA) 2 MG tablet Take 1 tablet (2 mg total) by mouth daily. Vianne Bulls, MD Reordered  Orderedas:doxazosin (CARDURA) tablet 2 mg - 2 mg, Oral, Daily, First dose on Tue 1/32/44 at 0102  folic acid (FOLVITE) 1 MG tablet Take 1 mg by mouth daily. Vianne Bulls, MD Reordered  Orderedas:folic acid (FOLVITE) tablet 1 mg - 1 mg, Oral, Daily, First dose on Tue 06/22/16 at 1000  furosemide (LASIX) 40 MG tablet Take 40 mg by mouth daily. Vianne Bulls, MD Reordered  Orderedas:furosemide (LASIX) tablet 40 mg - 40 mg, Oral, Daily, First dose on Tue 06/22/16 at 1000  HYDROcodone-acetaminophen (NORCO) 10-325 MG tablet Take 1 tablet by mouth every 6 (six) hours as needed for moderate pain or severe pain. Vianne Bulls, MD Reordered  Orderedas:HYDROcodone-acetaminophen [facility]) 10-325 MG per tablet 1 tablet - 1 tablet, Oral, Every 6 hours PRN, moderate pain, severe pain, Starting Mon 06/21/16 at 1937  losartan (COZAAR) 50 MG tablet Take 1 tablet (50 mg total) by mouth daily. Vianne Bulls, MD Not Ordered  MAGNESIUM CITRATE PO Take 0.5-1 Bottles by mouth once as needed (for mild constipation).  Vianne Bulls, MD Reordered  Orderedas:magnesium citrate solution 0.5 Bottle - 0.5 Bottle, Oral, Once PRN, for mild constipation, Starting Mon 06/21/16 at 1939, For 1 dose  Multiple Vitamins-Minerals (DRY EYE FORMULA) CAPS Place 1-2 drops into both eyes 4 (four) times daily as needed (for dry eyes). Vianne Bulls, MD Not Ordered  Orderedas:DRY EYE FORMULA CAPS 1-2 drop - 1-2 drop, Both Eyes, 4 times daily PRN, for dry eyes, Starting Mon 06/21/16 at [place] (Discontinued)  pantoprazole (PROTONIX) 40 MG tablet Take 1 tablet (40 mg total) by mouth daily. OpydIlene Qua, MD  Reordered  Orderedas:pantoprazole (PROTONIX) EC tablet 40 mg - 40 mg, Oral, Daily, First dose on Tue 06/22/16 at 1000  PROAIR HFA 108 (90 Base) MCG/ACT inhaler USE 1 TO 2 INHALATIONS EVERY 6 HOURS AS NEEDED FOR WHEEZING OR SHORTNESS OF BREATH  Vianne Bulls, MD Reordered  Orderedas:albuterol (PROVENTIL) (2.5 MG/3ML) 0.083% nebulizer solution 3 mL - 3 mL, Inhalation, Every 4 hours PRN, wheezing, shortness of breath, Starting Mon 06/21/16 at 1939  rOPINIRole (REQUIP) 1 MG tablet TAKE 1 TABLET AT BEDTIME Opyd, Ilene Qua, MD Reordered  Orderedas:rOPINIRole (REQUIP) tablet 1 mg - 1 mg, Oral, Daily at bedtime, First dose on Mon 06/21/16 at [place] 18 MCG inhalation capsule INHALE THE CONTENTS OF 1 CAPSULE WITH 2     INHALATIONS ONCE DAILY AS DIRECTED Opyd, Ilene Qua, MD Reordered   Patient taking differently: Inhale the contents of 1 capsule and inhale once a day    Orderedas:tiotropium (SPIRIVA) inhalation capsule 18 mcg - 18 mcg, Inhalation, Daily, First dose on Tue 06/22/16 at 0800  venlafaxine XR (EFFEXOR-XR) 150 MG 24 hr capsule TAKE 1 CAPSULE TWICE A DAY Opyd, Ilene Qua, MD Not Ordered  Orderedas:venlafaxine XR (EFFEXOR-XR) 24 hr capsule 150 mg - 150 mg, Oral, Daily with breakfast, First dose on Tue 06/22/16 at 0800 (Discontinued)  amLODipine (NORVASC) 10 MG tablet Take 1 tablet (10 mg total) by mouth daily. Vianne Bulls, MD Not Ordered   Patient not taking: Reported on 06/21/2016    OXYGEN Inhale 2 L into the lungs at bedtime. OpydIlene Qua, MD Not Ordered           ROS: No SOB, focal weakness, focal tingling, CP, fever, SOB, abdominal pain or N/V. Other ROS as per HPI.   Physical Examination: Blood pressure (!) 141/67, pulse (!) 56, temperature 97.5 F (36.4 C), temperature source Oral, resp. rate 16, height _0  (1.778 m), weight 97.8 kg (215 lb 9.8 oz), SpO2 100 %.  HEENT: [place]/AT Lungs: Respirations unlabored Ext: Pitting edema and trophic changes distal  to knees.   Neurologic Examination: Mental Status: Awake and alert. Pleasant and cooperative with good eye contact. Oriented to city, state, day and "1218". Halting speech with short phrases. Has naming and repetition difficulty that is worse than dysfluency; syntax relatively preserved. Able to follow all simple motor commands but has difficulty with a directional 2-step command. Dyscalculia is noted, worse when math questions are presented verbally as compared to when patient is asked to add fingers presented visually.  Cranial Nerves: II:  Visual fields intact. PERRL.  III,IV, VI: ptosis not present, EOMI V,VII: Mild right facial weakness, lower quadrant. Temp sensation intact bilaterally. VIII: hearing intact to voice IX,X: palate rises symmetrically XI: symmetric without lag on shoulder shrug XII: midline tongue extension  Motor: Right : Upper extremity   5/5    Left:     Upper extremity   5/5  Lower extremity   5/5     Lower extremity   5/5 Normal tone throughout; no atrophy noted Sensory: Temperature and FT sensation intact x 4. Positive for right sided extinction.    Deep Tendon Reflexes:  2+ bilateral brachioradialis and biceps.  3+ patellae bilaterally.  1+ achilles bilaterally Plantars: Right: downgoing  Left: downgoing Cerebellar: No ataxia with FNF bilaterally Gait: Deferred  Results for orders placed or performed during the hospital encounter of 06/21/16 (from the past 48 hour(s))  Protime-INR     Status: None   Collection Time: 06/21/16 11:18 AM  Result Value Ref Range   Prothrombin Time 13.0 11.4 - 15.2 seconds   INR 0.98   APTT     Status: None   Collection Time: 06/21/16 11:18 AM  Result Value Ref Range   aPTT 29 24 -  36 seconds  CBC     Status: None   Collection Time: 06/21/16 11:18 AM  Result Value Ref Range   WBC 7.8 4.0 - 10.5 K/uL   RBC 4.30 4.22 - 5.81 MIL/uL   Hemoglobin 13.9 13.0 - 17.0 g/dL   HCT 42.3 39.0 - 52.0 %   MCV 98.4 78.0 - 100.0 fL   MCH  32.3 26.0 - 34.0 pg   MCHC 32.9 30.0 - 36.0 g/dL   RDW 13.7 11.5 - 15.5 %   Platelets 312 150 - 400 K/uL  Differential     Status: None   Collection Time: 06/21/16 11:18 AM  Result Value Ref Range   Neutrophils Relative % 65 %   Neutro Abs 5.1 1.7 - 7.7 K/uL   Lymphocytes Relative 18 %   Lymphs Abs 1.4 0.7 - 4.0 K/uL   Monocytes Relative 9 %   Monocytes Absolute 0.7 0.1 - 1.0 K/uL   Eosinophils Relative 7 %   Eosinophils Absolute 0.5 0.0 - 0.7 K/uL   Basophils Relative 1 %   Basophils Absolute 0.1 0.0 - 0.1 K/uL  Comprehensive metabolic panel     Status: Abnormal   Collection Time: 06/21/16 11:18 AM  Result Value Ref Range   Sodium 135 135 - 145 mmol/L   Potassium 4.6 3.5 - 5.1 mmol/L   Chloride 101 101 - 111 mmol/L   CO2 26 22 - 32 mmol/L   Glucose, Bld 97 65 - 99 mg/dL   BUN 13 6 - 20 mg/dL   Creatinine, Ser 1.17 0.61 - 1.24 mg/dL   Calcium 9.0 8.9 - 10.3 mg/dL   Total Protein 7.1 6.5 - 8.1 g/dL   Albumin 4.0 3.5 - 5.0 g/dL   AST 22 15 - 41 U/L   ALT 16 (L) 17 - 63 U/L   Alkaline Phosphatase 76 38 - 126 U/L   Total Bilirubin 0.6 0.3 - 1.2 mg/dL   GFR calc non Af Amer 60 (L) >60 mL/min   GFR calc Af Amer >60 >60 mL/min    Comment: (NOTE) The eGFR has been calculated using the CKD EPI equation. This calculation has not been validated in all clinical situations. eGFR's persistently <60 mL/min signify possible Chronic Kidney Disease.    Anion gap 8 5 - 15  I-stat troponin, ED     Status: None   Collection Time: 06/21/16 11:43 AM  Result Value Ref Range   Troponin i, poc 0.00 0.00 - 0.08 ng/mL   Comment 3            Comment: Due to the release kinetics of cTnI, a negative result within the first hours of the onset of symptoms does not rule out myocardial infarction with certainty. If myocardial infarction is still suspected, repeat the test at appropriate intervals.   I-Stat Chem 8, ED     Status: Abnormal   Collection Time: 06/21/16 11:44 AM  Result Value Ref  Range   Sodium 136 135 - 145 mmol/L   Potassium 4.7 3.5 - 5.1 mmol/L   Chloride 99 (L) 101 - 111 mmol/L   BUN 14 6 - 20 mg/dL   Creatinine, Ser 1.10 0.61 - 1.24 mg/dL   Glucose, Bld 93 65 - 99 mg/dL   Calcium, Ion 1.17 1.15 - 1.40 mmol/L   TCO2 27 0 - 100 mmol/L   Hemoglobin 14.3 13.0 - 17.0 g/dL   HCT 42.0 39.0 - 52.0 %  Urinalysis, Routine w reflex microscopic  Status: Abnormal   Collection Time: 06/21/16  7:45 PM  Result Value Ref Range   Color, Urine YELLOW YELLOW   APPearance CLOUDY (A) CLEAR   Specific Gravity, Urine 1.013 1.005 - 1.030   pH 6.0 5.0 - 8.0   Glucose, UA NEGATIVE NEGATIVE mg/dL   Hgb urine dipstick NEGATIVE NEGATIVE   Bilirubin Urine NEGATIVE NEGATIVE   Ketones, ur NEGATIVE NEGATIVE mg/dL   Protein, ur NEGATIVE NEGATIVE mg/dL   Nitrite NEGATIVE NEGATIVE   Leukocytes, UA LARGE (A) NEGATIVE   RBC / HPF 0-5 0 - 5 RBC/hpf   WBC, UA TOO NUMEROUS TO COUNT 0 - 5 WBC/hpf   Bacteria, UA MANY (A) NONE SEEN   Squamous Epithelial / LPF 0-5 (A) NONE SEEN   WBC Clumps PRESENT    Hyaline Casts, UA PRESENT    Ct Head Wo Contrast  Result Date: 06/21/2016 CLINICAL DATA:  Left-sided facial droop and altered mental status EXAM: CT HEAD WITHOUT CONTRAST TECHNIQUE: Contiguous axial images were obtained from the base of the skull through the vertex without intravenous contrast. COMPARISON:  04/05/2016 FINDINGS: Brain: Diffuse chronic white matter ischemic changes are again identified and similar to that seen on prior MRI examination. Lacunar infarct is noted on the right in the frontal lobe stable from the prior study. Recently seen cerebellar infarcts are not as well appreciated on this exam as on the recent MRI. Vascular: No hyperdense vessel or unexpected calcification. Skull: Normal. Negative for fracture or focal lesion. Sinuses/Orbits: No acute finding. Other: None. IMPRESSION: Chronic ischemic changes and mild atrophic changes similar to that seen on previous exam. No acute  abnormality is identified. Electronically Signed   By: Inez Catalina M.D.   On: 06/21/2016 12:28   Mr Brain Wo Contrast  Result Date: 06/21/2016 CLINICAL DATA:  76 y/o  M; expressive aphasia. EXAM: MRI HEAD WITHOUT CONTRAST TECHNIQUE: Multiplanar, multiecho pulse sequences of the brain and surrounding structures were obtained without intravenous contrast. COMPARISON:  06/21/2016 CT of the head. FINDINGS: Brain: Small region of left parietal cortical three views diffusion and additional subcentimeter focus in the left superior parietal lobe compatible with acute/ early subacute infarction. Areas of reduced diffusion are associated with mild T2 FLAIR hyperintense signal abnormality. There is no significant mass effect or associated hemorrhage. Background of moderate chronic microvascular ischemic changes and parenchymal volume loss of the brain. Small chronic infarction and right frontal periventricular white matter. Small chronic lacunar infarcts are present in bilateral cerebellar hemispheres, bilateral thalamus, lentiform nuclei, and the right caudate body. No extra-axial collection, hydrocephalus, or herniation. Vascular: Normal flow voids. Skull and upper cervical spine: Normal marrow signal. Sinuses/Orbits: Negative. Other: None. IMPRESSION: 1. Small acute/early subacute infarct within the left lateral parietal lobe and subcentimeter focus in left superior parietal lobe. No hemorrhage or significant associated mass effect. 2. Background of moderate chronic microvascular ischemic changes and parenchymal volume loss of the brain. Several chronic lacunar infarcts in basal ganglia and cerebellum. These results will be called to the ordering clinician or representative by the Radiologist Assistant, and communication documented in the PACS or zVision Dashboard. Electronically Signed   By: Kristine Garbe M.D.   On: 06/21/2016 19:28    Assessment: 76 y.o. male with new onset of expressive and receptive  aphasia 1. Neurological exam best localizes as a left temporoparietal lesion.  2. MRI reveals a small acute/early subacute infarct within the left lateral parietal lobe and a subcentimeter focus in left superior parietal lobe. There is a background  of moderate chronic microvascular ischemic changes and parenchymal volume loss of the brain. Several chronic lacunar infarcts in basal ganglia and cerebellum are also noted 3. Classifiable as having failed stroke prophylaxis with Aggrenox  4. Stroke Risk Factors - CAD, CHF, HLD, HTN 5. CKD, COPD   Plan: 1. HgbA1c, fasting lipid panel 2. MRA of the brain without contrast 3. PT consult, OT consult, Speech consult 4. Echocardiogram 5. Carotid dopplers 6. Add Plavix to Aggrenox. Stroke team in AM may prefer DAPT with ASA/Plavix instead. 7. Telemetry monitoring 8. Continue atorvastatin at 40 mg po qd 9. Frequent neuro checks  _0  signed: Dr. Kerney Elbe  06/21/2016, 9:08 PM

## 2016-06-21 NOTE — ED Provider Notes (Signed)
[facility] DEPT Provider Note   CSN: [id number] Arrival date & time: 06/21/16  1048     History   Chief Complaint Chief Complaint  Patient presents with  . Altered Mental Status  . Aphasia    HPI [name] [name] is a 76 y.o. male with history of CVA (most recent 04/2016), CAD, CKD, COPD, MS who presents with expressive aphasia began this morning. Patient was last seen normal by his wife at 2100 last evening. Patient woke up this morning and was having difficulty completing sentences. He could not say his birthdate, the year, and could not remember many other things his wife was asking him. Patient denies any pain, weakness, or numbness or tingling. He denies any fever, chest pain, shortness of breath, abdominal pain, nausea, vomiting, any new urinary symptoms. Patient self caths 3 times daily due to urinary retention from MS and BPH.  HPI  Past Medical History:  Diagnosis Date  . Arthritis    s/p TKR  . Bladder cancer (HCC)    [facility], [facility] low grade papillary urothileal carcinoma  . BPH (benign prostatic hyperplasia)   . Brugada syndrome    Possible Type II Brugada ECG pattern. No family history of SCD, no syncope, no tachypalpitations.  Marland Kitchen CAD (coronary artery disease)    a. LHC 1/15 - mid LCx 50 and 60%, proximal RCA 50%  . Chronic diastolic CHF (congestive heart failure) ([place])   . CKD (chronic kidney disease)   . COPD (chronic obstructive pulmonary disease) ([place])    Prior heavy smoker. PFTs (3/11): FVC 87%, FEV1 73%, ratio 0.57, DLCO 75%, TLC 121%. Moderate obstructive defect. PFTs (9/15): Only minimal obstruction, sugPrior heavy smoker. PFTs (3/11): FVC 87%, FEV1 73%, ratio 0.57, DLCO 75%, TLC 121%. Moderate obstructive defect. PFTs (9/15) minimal obstruction, poss asthma component   . Depression    with bipolar tendencies  . DOE (dyspnea on exertion)    a. Myoview 8/09- EF 57%, no ischemia // b. Myoview (3/11) EF 68%, diaphragmatic attenuation, no ischemia //  c Echo  (4/11) EF 62-37%, mild diastolic dysfunction, PASP 38 mmHg // d. PFTs 9/15 minimal obstruction  /  e. CT negative for ILD  . GERD (gastroesophageal reflux disease)   . History of Doppler ultrasound    Carotid US 3/11 negative for significant stenosis  //  carotid US 6/17: Mild bilateral plaque, 1-39% ICA  . History of echocardiogram    a. Echo 12/14 Inferior and distal septal HK, mild LVH, EF 50-55%, mild MR, mild LAE  //  b. Echo 6/17: Mild focal basal septal hypertrophy, EF 60-65%, normal wall motion, grade 1 diastolic dysfunction, mild AI  . HLD (hyperlipidemia)   . Hypertensive heart disease with CHF (congestive heart failure) ([place]) 04/08/2009  . Low testosterone   . Lung nodule    a. CT in 2015 >> PET in 12/15 not sugg of malignancy   . LVH (left ventricular hypertrophy)    a. Echo 12/14 Inferior and distal septal HK, mild LVH, EF 50-55%, mild MR, mild LAE  . Mild dementia   . Mitral regurgitation    Echo (4/11) with PISA ERO 0.3 cm^2 and regurgitant volume 41 mL (moderate MR). Echo (12/14) with mild MR.   . MS (multiple sclerosis) ([place])   . MS (multiple sclerosis) ([place])   . Right bundle branch block   . Stroke ([place])   . Thoracic aortic aneurysm (HCC)    4.1 cm 2017  . Urinary retention with incomplete bladder emptying  receives botox injections and treatment for BPH through [facility]    Patient Active Problem List   Diagnosis Date Noted  . Stroke (cerebrum) ([place]) 06/21/2016  . Essential hypertension 05/17/2016  . Family history of stroke 04/06/2016  . Cognitive deficits 04/06/2016  . Cerebrovascular accident (CVA) due to embolism of cerebral artery ([place]) 04/04/2016  . Syncope and collapse   . Dysarthria   . Acute cystitis without hematuria   . History of ischemic right MCA stroke   . Slurred speech 06/20/2015  . AKI (acute kidney injury) ([place]) 06/20/2015  . Thoracic aortic aneurysm ([place])   . Hyperlipidemia 06/05/2015  . Gastroesophageal reflux disease without  esophagitis 06/05/2015  . COPD (chronic obstructive pulmonary disease) ([place]) 09/04/2013  . Lung nodule 09/04/2013  . Cancer ([place])   . Coronary artery disease involving native coronary artery of native heart without angina pectoris 02/01/2013  . Chronic diastolic CHF (congestive heart failure) ([place]) 01/03/2013  . Depression   . Urinary retention with incomplete bladder emptying   . BPH (benign prostatic hyperplasia)   . Brugada syndrome 08/23/2011  . Hypertensive heart disease with CHF (congestive heart failure) ([place]) 04/08/2009  . Mitral valve disorder 04/08/2009  . CAROTID BRUIT, RIGHT 03/06/2009  . SHORTNESS OF BREATH 03/06/2009  . OTHER CHEST PAIN 03/06/2009  . Possible Multiple sclerosis ([place]) 10/11/2006    Past Surgical History:  Procedure Laterality Date  . APPENDECTOMY    . EP IMPLANTABLE DEVICE N/A 06/23/2015   Procedure: Loop Recorder Insertion;  Surgeon: Deboraha Sprang, MD;  Location: [facility];  Service: Cardiovascular;  Laterality: N/A;  . HEMORRHOID SURGERY    . LEFT HEART CATHETERIZATION WITH CORONARY ANGIOGRAM N/A 01/05/2013   Procedure: LEFT HEART CATHETERIZATION WITH CORONARY ANGIOGRAM;  Surgeon: Larey Dresser, MD;  Location: [facility] CATH LAB;  Service: Cardiovascular;  Laterality: N/A;  . ROTATOR CUFF REPAIR    . TEE WITHOUT CARDIOVERSION N/A 06/23/2015   Procedure: TRANSESOPHAGEAL ECHOCARDIOGRAM (TEE);  Surgeon: Fay Records, MD;  Location: [facility] ENDOSCOPY;  Service: Cardiovascular;  Laterality: N/A;  . TONSILLECTOMY    . TOTAL KNEE ARTHROPLASTY Right   . TRANSURETHRAL RESECTION OF PROSTATE         Home Medications    Prior to Admission medications   Medication Sig Start Date End Date Taking? Authorizing Provider  venlafaxine XR (EFFEXOR-XR) 150 MG 24 hr capsule TAKE 1 CAPSULE TWICE A DAY 10/02/15  Yes [name], MD  acetaminophen (TYLENOL) 325 MG tablet Take 650 mg by mouth every 6 (six) hours as needed (for pain or headaches).     [provider]  amantadine (SYMMETREL) 100 MG capsule TAKE 1 CAPSULE BY MOUTH TWICE DAILY WITH BREAKFAST AND LUNCH 12/03/15   [name], MD  amLODipine (NORVASC) 10 MG tablet Take 1 tablet (10 mg total) by mouth daily. Patient not taking: Reported on 06/21/2016 01/20/16   [name], MD  atorvastatin (LIPITOR) 40 MG tablet Take 1 tablet (40 mg total) by mouth daily. 04/29/16 07/28/16  Richardson Dopp T, PA-C  bisoprolol (ZEBETA) 10 MG tablet Take 1 tablet (10 mg total) by mouth daily. 05/14/16   [name], MD  cholecalciferol (VITAMIN D) 400 units TABS tablet Take 400 Units by mouth daily.     [provider]  Cyanocobalamin (B-12) 1000 MCG CAPS Take 1,000 mcg by mouth daily.     [provider]  dipyridamole-aspirin (AGGRENOX) 200-25 MG 12hr capsule Take 1 capsule by mouth 2 (two)  times daily. 04/20/16   Donzetta Starch, NP  doxazosin (CARDURA) 2 MG tablet Take 1 tablet (2 mg total) by mouth daily. 03/19/16   [name], MD  folic acid (FOLVITE) 1 MG tablet Take 1 mg by mouth daily.    [provider]  furosemide (LASIX) 40 MG tablet Take 40 mg by mouth daily.    [provider]  HYDROcodone-acetaminophen (NORCO) 10-325 MG tablet Take 1 tablet by mouth every 6 (six) hours as needed for moderate pain or severe pain. 06/16/16   [name], MD  losartan (COZAAR) 50 MG tablet Take 1 tablet (50 mg total) by mouth daily. 06/18/16   [name], MD  MAGNESIUM CITRATE PO Take 0.5-1 Bottles by mouth once as needed (for mild constipation).     [provider]  pantoprazole (PROTONIX) 40 MG tablet Take 1 tablet (40 mg total) by mouth daily. 12/12/15   [name], MD  PROAIR HFA 108 [phone] Base) MCG/ACT inhaler USE 1 TO 2 INHALATIONS EVERY 6 HOURS AS NEEDED FOR WHEEZING OR SHORTNESS OF BREATH 10/17/15   [name], MD  rOPINIRole (REQUIP) 1 MG tablet TAKE 1 TABLET AT BEDTIME 11/17/15   [name], MD  SPIRIVA  HANDIHALER 18 MCG inhalation capsule INHALE THE CONTENTS OF 1 CAPSULE WITH 2         INHALATIONS ONCE DAILY AS DIRECTED    Dennard Schaumann Cammie Mcgee, MD    Family History Family History  Problem Relation Age of Onset  . Stroke Father   . Stroke Mother   . Hypertension Sister   . Kidney cancer Brother   . Arthritis Brother   . Other Brother        knee problems, Bil TKR  . Hypertension Unknown        family history  . Prostate cancer Neg Hx   . Bladder Cancer Neg Hx     Social History Social History  Substance Use Topics  . Smoking status: Former Smoker    Packs/day: 2.00    Years: 40.00    Types: Cigarettes    Quit date: 01/04/1993  . Smokeless tobacco: Former Systems developer    Types: Chew     Comment: patient still smokes a pipe daily  . Alcohol use 15.0 oz/week    25 Cans of beer per week     Comment: 12 pack a week     Allergies   Acetylcholine; Alcohol-sulfur [sulfur]; Amitriptyline; Bupivacaine; Clomipramine hcl; Cocaine; Desipramine; Ergonovine; Flecainide; Lithium; Loxapine; Nortriptyline; Oxcarbazepine; Procainamide; Procaine; Propafenone; Propofol; and Trifluoperazine   Review of Systems Review of Systems  Constitutional: Negative for chills and fever.  HENT: Negative for facial swelling and sore throat.   Respiratory: Negative for shortness of breath.   Cardiovascular: Negative for chest pain.  Gastrointestinal: Negative for abdominal pain, nausea and vomiting.  Genitourinary: Negative for dysuria.  Musculoskeletal: Negative for back pain.  Skin: Negative for rash and wound.  Neurological: Positive for speech difficulty. Negative for dizziness, weakness, light-headedness, numbness and headaches.  Psychiatric/Behavioral: The patient is not nervous/anxious.      Physical Exam Updated Vital Signs BP (!) 145/70   Pulse (!) 51   Temp 97.9 F (36.6 C)   Resp 11   Ht 5\' 11"  (1.803 m)   Wt 99.8 kg (220 lb)   SpO2 99%   BMI 30.68 kg/m   Physical Exam  Constitutional:  He appears well-developed and well-nourished. No distress.  HENT:  Head: Normocephalic  and atraumatic.  Mouth/Throat: Oropharynx is clear and moist. No oropharyngeal exudate.  Eyes: Conjunctivae are normal. Pupils are equal, round, and reactive to light. Right eye exhibits no discharge. Left eye exhibits no discharge. No scleral icterus.  Neck: Normal range of motion. Neck supple. No thyromegaly present.  Cardiovascular: Normal rate, regular rhythm, normal heart sounds and intact distal pulses.  Exam reveals no gallop and no friction rub.   No murmur heard. Pulmonary/Chest: Effort normal and breath sounds normal. No stridor. No respiratory distress. He has no wheezes. He has no rales.  Abdominal: Soft. Bowel sounds are normal. He exhibits no distension. There is no tenderness. There is no rebound and no guarding.  Musculoskeletal: He exhibits no edema.  Lymphadenopathy:    He has no cervical adenopathy.  Neurological: He is alert. Coordination normal.  CN 3-12 intact; normal sensation throughout; 5/5 strength in all 4 extremities; equal bilateral grip strength; no ataxia on finger to nose; normal heel-to-shin test; patient does have expressive aphasia; he is oriented to self and place  Skin: Skin is warm and dry. No rash noted. He is not diaphoretic. No pallor.  Psychiatric: He has a normal mood and affect.  Nursing note and vitals reviewed.    ED Treatments / Results  Labs (all labs ordered are listed, but only abnormal results are displayed) Labs Reviewed  COMPREHENSIVE METABOLIC PANEL - Abnormal; Notable for the following:       Result Value   ALT 16 (*)    GFR calc non Af Amer 60 (*)    All other components within normal limits  I-STAT CHEM 8, ED - Abnormal; Notable for the following:    Chloride 99 (*)    All other components within normal limits  PROTIME-INR  APTT  CBC  DIFFERENTIAL  URINALYSIS, ROUTINE W REFLEX MICROSCOPIC  I-STAT TROPOININ, ED    EKG  EKG  Interpretation  Date/Time:  Monday June 21 2016 15:30:46 EDT Ventricular Rate:  57 PR Interval:    QRS Duration: 130 QT Interval:  453 QTC Calculation: 442 R Axis:   -29 Text Interpretation:  Sinus rhythm Prolonged PR interval IVCD, consider atypical RBBB No significant change since last tracing Confirmed by Gareth Morgan [phone]) on 06/21/2016 6:21:15 PM       Radiology Ct Head Wo Contrast  Result Date: 06/21/2016 CLINICAL DATA:  Left-sided facial droop and altered mental status EXAM: CT HEAD WITHOUT CONTRAST TECHNIQUE: Contiguous axial images were obtained from the base of the skull through the vertex without intravenous contrast. COMPARISON:  04/05/2016 FINDINGS: Brain: Diffuse chronic white matter ischemic changes are again identified and similar to that seen on prior MRI examination. Lacunar infarct is noted on the right in the frontal lobe stable from the prior study. Recently seen cerebellar infarcts are not as well appreciated on this exam as on the recent MRI. Vascular: No hyperdense vessel or unexpected calcification. Skull: Normal. Negative for fracture or focal lesion. Sinuses/Orbits: No acute finding. Other: None. IMPRESSION: Chronic ischemic changes and mild atrophic changes similar to that seen on previous exam. No acute abnormality is identified. Electronically Signed   By: Inez Catalina M.D.   On: 06/21/2016 12:28    Procedures Procedures (including critical care time)  Medications Ordered in ED Medications - No data to display   Initial Impression / Assessment and Plan / ED Course  I have reviewed the triage vital signs and the nursing notes.  Pertinent labs & imaging results that were available during my care  of the patient were reviewed by me and considered in my medical decision making (see chart for details).     Patient with expressive aphasia, last seen normal last night at 2100. Initial CT of the head shows no acute findings. Labs are unremarkable. EKG shows  NSR and no significant changes since last tracing. MRI is pending concerning for acute CVA. Discussed case with neurologist, Dr. Leonel Ramsay, who agrees patient symptoms are concerning for acute CVA and recommends admission to medicine. I spoke with Dr. Myna Hidalgo, with [facility], who will admit the patient for further evaluation and treatment. I discussed patient case with Dr. Billy Fischer who guided the patient's management and agrees with plan.   Final Clinical Impressions(s) / ED Diagnoses   Final diagnoses:  Expressive aphasia    New Prescriptions New Prescriptions   No medications on file     Frederica Kuster, Hershal Coria 06/21/16 1827    Gareth Morgan, MD 06/24/16 1319

## 2016-06-21 NOTE — ED Triage Notes (Signed)
Pt arrives from home reporting AMS with slurred speech this am, LSN last night 2100.  Pt's spouse states pt is improving. Pt has hx CVA x2.  L facial droop noted in triage.

## 2016-06-21 NOTE — Telephone Encounter (Signed)
pts wife calling in to let us know that pt has suffered a stroke is at [facility] hospital.

## 2016-06-21 NOTE — ED Notes (Signed)
Patient transported to MRI 

## 2016-06-21 NOTE — H&P (Signed)
History and Physical    [name] [name] EHO:[id number] DOB: 1940-10-12 DOA: 06/21/2016  PCP: [name], MD   Patient coming from: Home  Chief Complaint: Expressive aphasia   HPI: [name] [name] is a 76 y.o. male with medical history significant for coronary artery disease, chronic diastolic CHF, low-grade urothelial carcinoma, COPD with nocturnal requirement for supplemental O2, and history of multiple ischemic strokes, now presenting to the emergency department for expressive aphasia. Patient is accompanied by his wife who assists with the history. They both report that the patient seemed to be in his usual state of health yesterday and was in his normal state when he went to bed. Patient reports feeling well upon waking this morning, but his wife noted that he seemed to have some mild confusion and difficulty finishing his sentences. It seemed that he was having trouble finding the right words to use. The patient did not appreciate this, but it was clearly a change in his condition per his wife. Given the patient's history of ischemic strokes, most recently in April 2018, she was concern for another CVA and brought him into the ED for evaluation. Patient denies any headache, change in vision or hearing, or focal numbness or weakness. He denies any chest pain or palpitations and denies any dyspnea or cough. Patient was hospitalized overnight with shortness of breath suspected secondary to acute CHF on 06/09/2016 while on vacation in [place]. His Lasix was doubled for 3 days and his dyspnea and bilateral leg swelling resolved. Patient was evaluated for an acute stroke in April 2018, was treated with IV tPA, had negative echo with bubble study, and had a loop recorder placed which has not revealed any arrhythmia to date.  ED Course: Upon arrival to the ED, patient is found to be afebrile, saturating well on room air, bradycardic in the mid 50s, and with vitals otherwise stable. EKG features a  sinus rhythm with first degree AV nodal block and nonspecific IVCD. Chemistry panel was unremarkable and CBC is within normal limits. INR is normal and troponin is undetectable. Noncontrast head CT demonstrates chronic ischemic changes, but is negative for acute intercranial abnormality. Neurology was consulted by the ED physician and advised for a medical admission due to suspected CVA. MRI of the brain was just performed and demonstrates a small acute/early subacute infarct in the left lateral parietal lobe and a subcentimeter focus in the superior parietal lobe, in addition to chronic findings. Patient has remained hemodynamically stable in the ED and in no apparent respiratory distress, and he will be observed on the telemetry unit for ongoing evaluation and management of a recurrent ischemic CVA.  Review of Systems:  All other systems reviewed and apart from HPI, are negative.  Past Medical History:  Diagnosis Date  . Arthritis    s/p TKR  . Bladder cancer (HCC)    [facility], [facility] low grade papillary urothileal carcinoma  . BPH (benign prostatic hyperplasia)   . Brugada syndrome    Possible Type II Brugada ECG pattern. No family history of SCD, no syncope, no tachypalpitations.  Marland Kitchen CAD (coronary artery disease)    a. LHC 1/15 - mid LCx 50 and 60%, proximal RCA 50%  . Chronic diastolic CHF (congestive heart failure) ([place])   . CKD (chronic kidney disease)   . COPD (chronic obstructive pulmonary disease) ([place])    Prior heavy smoker. PFTs (3/11): FVC 87%, FEV1 73%, ratio 0.57, DLCO 75%, TLC 121%. Moderate obstructive defect. PFTs (9/15): Only minimal obstruction,  sugPrior heavy smoker. PFTs (3/11): FVC 87%, FEV1 73%, ratio 0.57, DLCO 75%, TLC 121%. Moderate obstructive defect. PFTs (9/15) minimal obstruction, poss asthma component   . Depression    with bipolar tendencies  . DOE (dyspnea on exertion)    a. Myoview 8/09- EF 57%, no ischemia // b. Myoview (3/11) EF 68%, diaphragmatic attenuation, no  ischemia //  c Echo (4/11) EF 43-15%, mild diastolic dysfunction, PASP 38 mmHg // d. PFTs 9/15 minimal obstruction  /  e. CT negative for ILD  . GERD (gastroesophageal reflux disease)   . History of Doppler ultrasound    Carotid US 3/11 negative for significant stenosis  //  carotid US 6/17: Mild bilateral plaque, 1-39% ICA  . History of echocardiogram    a. Echo 12/14 Inferior and distal septal HK, mild LVH, EF 50-55%, mild MR, mild LAE  //  b. Echo 6/17: Mild focal basal septal hypertrophy, EF 60-65%, normal wall motion, grade 1 diastolic dysfunction, mild AI  . HLD (hyperlipidemia)   . Hypertensive heart disease with CHF (congestive heart failure) ([place]) 04/08/2009  . Low testosterone   . Lung nodule    a. CT in 2015 >> PET in 12/15 not sugg of malignancy   . LVH (left ventricular hypertrophy)    a. Echo 12/14 Inferior and distal septal HK, mild LVH, EF 50-55%, mild MR, mild LAE  . Mild dementia   . Mitral regurgitation    Echo (4/11) with PISA ERO 0.3 cm^2 and regurgitant volume 41 mL (moderate MR). Echo (12/14) with mild MR.   . MS (multiple sclerosis) ([place])   . MS (multiple sclerosis) ([place])   . Right bundle branch block   . Stroke ([place])   . Thoracic aortic aneurysm (HCC)    4.1 cm 2017  . Urinary retention with incomplete bladder emptying    receives botox injections and treatment for BPH through [facility]    Past Surgical History:  Procedure Laterality Date  . APPENDECTOMY    . EP IMPLANTABLE DEVICE N/A 06/23/2015   Procedure: Loop Recorder Insertion;  Surgeon: Deboraha Sprang, MD;  Location: [facility];  Service: Cardiovascular;  Laterality: N/A;  . HEMORRHOID SURGERY    . LEFT HEART CATHETERIZATION WITH CORONARY ANGIOGRAM N/A 01/05/2013   Procedure: LEFT HEART CATHETERIZATION WITH CORONARY ANGIOGRAM;  Surgeon: Larey Dresser, MD;  Location: [facility] CATH LAB;  Service: Cardiovascular;  Laterality: N/A;  . ROTATOR CUFF REPAIR    . TEE WITHOUT CARDIOVERSION N/A 06/23/2015    Procedure: TRANSESOPHAGEAL ECHOCARDIOGRAM (TEE);  Surgeon: Fay Records, MD;  Location[facility] ENDOSCOPY;  Service: Cardiovascular;  Laterality: N/A;  . TONSILLECTOMY    . TOTAL KNEE ARTHROPLASTY Right   . TRANSURETHRAL RESECTION OF PROSTATE       reports that he quit smoking about 23 years ago. His smoking use included Cigarettes. He has a 80.00 pack-year smoking history. He has quit using smokeless tobacco. His smokeless tobacco use included Chew. He reports that he drinks about 15.0 oz of alcohol per week . He reports that he does not use drugs.  Allergies  Allergen Reactions  . Acetylcholine     Patient was suspected of having "Brugada Syndrome": (BrS) is a genetic condition that results in abnormal electrical activity within the heart, increasing the risk of sudden cardiac death. Those affected may have episodes of passing out.  Marland Kitchen Alcohol-Sulfur [Sulfur] Other (See Comments)    Patient was suspected of having "Brugada Syndrome"  . Amitriptyline Other (See Comments)  Patient was suspected of having "Brugada Syndrome"  . Bupivacaine Other (See Comments)    Patient was suspected of having "Brugada Syndrome"  . Clomipramine Hcl Other (See Comments)    Patient was suspected of having "Brugada Syndrome"  . Cocaine Other (See Comments)    Patient was suspected of having "Brugada Syndrome"  . Desipramine Other (See Comments)    Patient was suspected of having "Brugada Syndrome"  . Ergonovine Other (See Comments)    Patient was suspected of having "Brugada Syndrome"  . Flecainide Other (See Comments)    Patient was suspected of having "Brugada Syndrome"  . Lithium Other (See Comments)    Patient was suspected of having "Brugada Syndrome"  . Loxapine Other (See Comments)    Patient was suspected of having "Brugada Syndrome"  . Nortriptyline Other (See Comments)    Patient was suspected of having "Brugada Syndrome"  . Oxcarbazepine Other (See Comments)    Patient was suspected of having  "Brugada Syndrome"  . Procainamide Other (See Comments)    Patient was suspected of having "Brugada Syndrome"  . Procaine Other (See Comments)    Patient was suspected of having "Brugada Syndrome"  . Propafenone Other (See Comments)    Patient was suspected of having "Brugada Syndrome"  . Propofol Other (See Comments)    Patient was suspected of having "Brugada Syndrome"  . Trifluoperazine Other (See Comments)    Patient was suspected of having "Brugada Syndrome"    Family History  Problem Relation Age of Onset  . Stroke Father   . Stroke Mother   . Hypertension Sister   . Kidney cancer Brother   . Arthritis Brother   . Other Brother        knee problems, Bil TKR  . Hypertension Unknown        family history  . Prostate cancer Neg Hx   . Bladder Cancer Neg Hx      Prior to Admission medications   Medication Sig Start Date End Date Taking? Authorizing Provider  acetaminophen (TYLENOL) 325 MG tablet Take 650 mg by mouth every 6 (six) hours as needed (for pain or headaches).    Yes [provider]  amantadine (SYMMETREL) 100 MG capsule TAKE 1 CAPSULE BY MOUTH TWICE DAILY WITH BREAKFAST AND LUNCH 12/03/15  Yes [name], MD  atorvastatin (LIPITOR) 40 MG tablet Take 1 tablet (40 mg total) by mouth daily. 04/29/16 07/28/16 Yes Weaver, Scott T, PA-C  bisoprolol (ZEBETA) 10 MG tablet Take 1 tablet (10 mg total) by mouth daily. 05/14/16  Yes [name], MD  cholecalciferol (VITAMIN D) 400 units TABS tablet Take 400 Units by mouth daily.    Yes [provider]  Cyanocobalamin (B-12) 1000 MCG CAPS Take 1,000 mcg by mouth daily.    Yes [provider]  dipyridamole-aspirin (AGGRENOX) 200-25 MG 12hr capsule Take 1 capsule by mouth 2 (two) times daily. 04/20/16  Yes Donzetta Starch, NP  doxazosin (CARDURA) 2 MG tablet Take 1 tablet (2 mg total) by mouth daily. 03/19/16  Yes [name], MD  folic acid (FOLVITE) 1 MG tablet Take 1 mg by mouth daily.    Yes [provider]  furosemide (LASIX) 40 MG tablet Take 40 mg by mouth daily.   Yes [provider]  HYDROcodone-acetaminophen (NORCO) 10-325 MG tablet Take 1 tablet by mouth every 6 (six) hours as needed for moderate pain or severe pain. 06/16/16  Yes [name], MD  losartan (COZAAR) 50  MG tablet Take 1 tablet (50 mg total) by mouth daily. 06/18/16  Yes [name], MD  MAGNESIUM CITRATE PO Take 0.5-1 Bottles by mouth once as needed (for mild constipation).    Yes [provider]  Multiple Vitamins-Minerals (DRY EYE FORMULA) CAPS Place 1-2 drops into both eyes 4 (four) times daily as needed (for dry eyes).   Yes [provider]  pantoprazole (PROTONIX) 40 MG tablet Take 1 tablet (40 mg total) by mouth daily. 12/12/15  Yes [name], MD  PROAIR HFA 108 [phone] Base) MCG/ACT inhaler USE 1 TO 2 INHALATIONS EVERY 6 HOURS AS NEEDED FOR WHEEZING OR SHORTNESS OF BREATH 10/17/15  Yes [name], MD  rOPINIRole (REQUIP) 1 MG tablet TAKE 1 TABLET AT BEDTIME 11/17/15  Yes Pickard, Cammie Mcgee, MD  SPIRIVA HANDIHALER 18 MCG inhalation capsule INHALE THE CONTENTS OF 1 CAPSULE WITH 2         INHALATIONS ONCE DAILY AS DIRECTED Patient taking differently: Inhale the contents of 1 capsule and inhale once a day   Yes [name], MD  venlafaxine XR (EFFEXOR-XR) 150 MG 24 hr capsule TAKE 1 CAPSULE TWICE A DAY 10/02/15  Yes [name], MD  amLODipine (NORVASC) 10 MG tablet Take 1 tablet (10 mg total) by mouth daily. Patient not taking: Reported on 06/21/2016 01/20/16   [name], MD  OXYGEN Inhale 2 L into the lungs at bedtime.    [provider]    Physical Exam: Vitals:   06/21/16 1700 06/21/16 1730 06/21/16 1745 06/21/16 1752  BP: (!) 146/81 (!) 145/70 (!) 142/69   Pulse: (!) 54 (!) 51 (!) 53   Resp: 13 11 16    Temp:    97.9 F (36.6 C)  TempSrc:      SpO2: 98% 99% 100%   Weight:      Height:          Constitutional:  NAD, calm, comfortable Eyes: PERTLA, lids and conjunctivae normal ENMT: Mucous membranes are moist. Posterior pharynx clear of any exudate or lesions.   Neck: normal, supple, no masses, no thyromegaly Respiratory: clear to auscultation bilaterally, no wheezing, no crackles. Normal respiratory effort.   Cardiovascular: S1 & S2 heard, regular rate and rhythm. No extremity edema. No significant JVD. Abdomen: No distension, no tenderness, no masses palpated. Bowel sounds normal.  Musculoskeletal: no clubbing / cyanosis. No joint deformity upper and lower extremities.   Skin: no significant rashes, lesions, ulcers. Warm, dry, well-perfused. Neurologic: No gross facial asymmetry. PERRL, EOMI. Patellar DTR's normal. Strength 5/5 in all 4 limbs. Gross hearing deficit, subtle word-finding difficulty.  Psychiatric: Normal judgment and insight. Alert and oriented x 3. Pleasant and cooperative.     Labs on Admission: I have personally reviewed following labs and imaging studies  CBC:  Recent Labs Lab 06/21/16 1118 06/21/16 1144  WBC 7.8  --   NEUTROABS 5.1  --   HGB 13.9 14.3  HCT 42.3 42.0  MCV 98.4  --   PLT 312  --    Basic Metabolic Panel:  Recent Labs Lab 06/18/16 0918 06/21/16 1118 06/21/16 1144  NA 135 135 136  K 4.0 4.6 4.7  CL 100 101 99*  CO2 22 26  --   GLUCOSE 129* 97 93  BUN 18 13 14   CREATININE 1.27* 1.17 1.10  CALCIUM 9.3 9.0  --    GFR: Estimated Creatinine Clearance: 70.9 mL/min (by C-G formula based on SCr of 1.1 mg/dL). Liver  Function Tests:  Recent Labs Lab 06/21/16 1118  AST 22  ALT 16*  ALKPHOS 76  BILITOT 0.6  PROT 7.1  ALBUMIN 4.0   No results for input(s): LIPASE, AMYLASE in the last 168 hours. No results for input(s): AMMONIA in the last 168 hours. Coagulation Profile:  Recent Labs Lab 06/21/16 1118  INR 0.98   Cardiac Enzymes: No results for input(s): CKTOTAL, CKMB, CKMBINDEX, TROPONINI in the last 168 hours. BNP (last 3  results)  Recent Labs  03/30/16 1047 04/07/16 1112  PROBNP 386* 117   HbA1C: No results for input(s): HGBA1C in the last 72 hours. CBG: No results for input(s): GLUCAP in the last 168 hours. Lipid Profile: No results for input(s): CHOL, HDL, LDLCALC, TRIG, CHOLHDL, LDLDIRECT in the last 72 hours. Thyroid Function Tests: No results for input(s): TSH, T4TOTAL, FREET4, T3FREE, THYROIDAB in the last 72 hours. Anemia Panel: No results for input(s): VITAMINB12, FOLATE, FERRITIN, TIBC, IRON, RETICCTPCT in the last 72 hours. Urine analysis:    Component Value Date/Time   COLORURINE YELLOW 04/27/2016 1253   APPEARANCEUR Clear 05/24/2016 1114   LABSPEC 1.010 04/27/2016 1253   PHURINE 6.0 04/27/2016 1253   GLUCOSEU Negative 05/24/2016 1114   HGBUR NEGATIVE 04/27/2016 1253   BILIRUBINUR Negative 05/24/2016 1114   KETONESUR NEGATIVE 04/27/2016 1253   PROTEINUR Negative 05/24/2016 1114   PROTEINUR NEGATIVE 04/27/2016 1253   UROBILINOGEN 0.2 07/04/2013 1050   NITRITE Negative 05/24/2016 1114   NITRITE NEGATIVE 04/27/2016 1253   LEUKOCYTESUR 1+ (A) 05/24/2016 1114   Sepsis Labs: @LABRCNTIP (procalcitonin:4,lacticidven:4) )No results found for this or any previous visit (from the past 240 hour(s)).   Radiological Exams on Admission: Ct Head Wo Contrast  Result Date: 06/21/2016 CLINICAL DATA:  Left-sided facial droop and altered mental status EXAM: CT HEAD WITHOUT CONTRAST TECHNIQUE: Contiguous axial images were obtained from the base of the skull through the vertex without intravenous contrast. COMPARISON:  04/05/2016 FINDINGS: Brain: Diffuse chronic white matter ischemic changes are again identified and similar to that seen on prior MRI examination. Lacunar infarct is noted on the right in the frontal lobe stable from the prior study. Recently seen cerebellar infarcts are not as well appreciated on this exam as on the recent MRI. Vascular: No hyperdense vessel or unexpected calcification.  Skull: Normal. Negative for fracture or focal lesion. Sinuses/Orbits: No acute finding. Other: None. IMPRESSION: Chronic ischemic changes and mild atrophic changes similar to that seen on previous exam. No acute abnormality is identified. Electronically Signed   By: Inez Catalina M.D.   On: 06/21/2016 12:28   Mr Brain Wo Contrast  Result Date: 06/21/2016 CLINICAL DATA:  76 y/o  M; expressive aphasia. EXAM: MRI HEAD WITHOUT CONTRAST TECHNIQUE: Multiplanar, multiecho pulse sequences of the brain and surrounding structures were obtained without intravenous contrast. COMPARISON:  06/21/2016 CT of the head. FINDINGS: Brain: Small region of left parietal cortical three views diffusion and additional subcentimeter focus in the left superior parietal lobe compatible with acute/ early subacute infarction. Areas of reduced diffusion are associated with mild T2 FLAIR hyperintense signal abnormality. There is no significant mass effect or associated hemorrhage. Background of moderate chronic microvascular ischemic changes and parenchymal volume loss of the brain. Small chronic infarction and right frontal periventricular white matter. Small chronic lacunar infarcts are present in bilateral cerebellar hemispheres, bilateral thalamus, lentiform nuclei, and the right caudate body. No extra-axial collection, hydrocephalus, or herniation. Vascular: Normal flow voids. Skull and upper cervical spine: Normal marrow signal. Sinuses/Orbits: Negative. Other: None.  IMPRESSION: 1. Small acute/early subacute infarct within the left lateral parietal lobe and subcentimeter focus in left superior parietal lobe. No hemorrhage or significant associated mass effect. 2. Background of moderate chronic microvascular ischemic changes and parenchymal volume loss of the brain. Several chronic lacunar infarcts in basal ganglia and cerebellum. These results will be called to the ordering clinician or representative by the Radiologist Assistant, and  communication documented in the PACS or zVision Dashboard. Electronically Signed   By: Kristine Garbe M.D.   On: 06/21/2016 19:28    EKG: Independently reviewed. Sinus rhythm, 1st degree AV block, non-specific IVCD.   Assessment/Plan  1. Acute ischemic stroke  - Pt presents with expressive aphasia, first noted upon waking this morning, last seen well at ~21:00 on 06/20/16  - Vitals stable, labs unremarkable, mild expressive aphasia noted on presentation  - Non-contrast head CT negative for acute findings, but MRI brain reveals small acute/early subacute infarct in lateral parietal lobe and a subcentimeter focus in superior parietal lobe - Neurology is consulting and much appreciated; tPA not considered due to the presentation beyond the appropriate time frame  - There has not been any palpitations and, per report, no arrhythmia noted thus far on loop recorder placed in April 2018 - He follows with neurology in outpatient setting for hx recurrent CVA's, was switched from Plavix to Aggrenox after CVA in April '18 and his outpt neurologist indicated that he may need full AC if there is another recurrence  - Plan to monitor on telemetry with frequent neuro checks, keep NPO pending swallow screen, maintain normothermia, euvolemia, euglycemia  - Plan to obtain MRA brain, TTE, and carotid dopplers; he had echo with bubble study and CTA head and neck in early April '18, but with MRI-evidence for acute CVA, suspect that interval development of an intracardiac thrombus should be excluded but will defer to neurology consultant   2. CAD  - No anginal complaints  - Continue statin and Aggrenox - ARB and beta-blocker held initially for acute phase of ischemic CVA   3. CKD stage II  - SCr is 1.17 on admission, better than recent priors  - He has been following with nephrology in the outpatient setting   4. Chronic diastolic CHF  - Appears euvolemic on admission with no edema or JVD, denies  orthopnea - Had been admitted briefly on 06/09/16 while [place] vacation in [place] for dyspnea and swelling suspected secondary to acute CHF; sxs resolved after increasing his Lasix for 3 days   - Continue Lasix 40 mg qD as tolerated; ARB and beta-blocker held given acute phase of ischemic CVA     5. Hypertension  - BP is at goal  - Managed at home with losartan, Norvasc, and bisoprolol    6. Chronic urinary retention   - Attributed to low-grade urothelial carcinoma managed at [facility], plus possible multiple sclerosis  - Managed at home with in & out cath TID  - Continue in and out cath prn    7. Sinus bradycardia  - Rate in 50's, pt asymptomatic, likely secondary to beta-blocker  - Bisoprolol held as above; he is monitored on telemetry    DVT prophylaxis: sq Lovenox Code Status: Full  Family Communication: Wife updated at bedside Disposition Plan: Observe on telemetry Consults called: Neurology Admission status: Observation    Vianne Bulls, MD [facility] Pager [phone]  If 7PM-7AM, please contact night-coverage [email or web address] Password [facility]  06/21/2016, 7:47 PM

## 2016-06-21 NOTE — ED Notes (Signed)
PT and visitor taken a meal by pharmacy tech

## 2016-06-22 ENCOUNTER — Observation Stay (HOSPITAL_COMMUNITY): Payer: Medicare Other

## 2016-06-22 ENCOUNTER — Encounter (HOSPITAL_COMMUNITY): Payer: Self-pay | Admitting: Neurology

## 2016-06-22 ENCOUNTER — Ambulatory Visit (INDEPENDENT_AMBULATORY_CARE_PROVIDER_SITE_OTHER): Payer: Medicare Other | Admitting: *Deleted

## 2016-06-22 ENCOUNTER — Encounter (HOSPITAL_COMMUNITY): Payer: Medicare Other

## 2016-06-22 DIAGNOSIS — K219 Gastro-esophageal reflux disease without esophagitis: Secondary | ICD-10-CM | POA: Diagnosis not present

## 2016-06-22 DIAGNOSIS — R29810 Facial weakness: Secondary | ICD-10-CM | POA: Diagnosis present

## 2016-06-22 DIAGNOSIS — I63412 Cerebral infarction due to embolism of left middle cerebral artery: Secondary | ICD-10-CM | POA: Diagnosis not present

## 2016-06-22 DIAGNOSIS — I639 Cerebral infarction, unspecified: Secondary | ICD-10-CM

## 2016-06-22 DIAGNOSIS — J438 Other emphysema: Secondary | ICD-10-CM | POA: Diagnosis not present

## 2016-06-22 DIAGNOSIS — I498 Other specified cardiac arrhythmias: Secondary | ICD-10-CM | POA: Diagnosis present

## 2016-06-22 DIAGNOSIS — R4701 Aphasia: Secondary | ICD-10-CM | POA: Diagnosis present

## 2016-06-22 DIAGNOSIS — G35 Multiple sclerosis: Secondary | ICD-10-CM | POA: Diagnosis present

## 2016-06-22 DIAGNOSIS — I2510 Atherosclerotic heart disease of native coronary artery without angina pectoris: Secondary | ICD-10-CM | POA: Diagnosis not present

## 2016-06-22 DIAGNOSIS — R488 Other symbolic dysfunctions: Secondary | ICD-10-CM | POA: Diagnosis present

## 2016-06-22 DIAGNOSIS — J449 Chronic obstructive pulmonary disease, unspecified: Secondary | ICD-10-CM | POA: Diagnosis present

## 2016-06-22 DIAGNOSIS — F329 Major depressive disorder, single episode, unspecified: Secondary | ICD-10-CM

## 2016-06-22 DIAGNOSIS — N390 Urinary tract infection, site not specified: Secondary | ICD-10-CM | POA: Diagnosis not present

## 2016-06-22 DIAGNOSIS — R338 Other retention of urine: Secondary | ICD-10-CM | POA: Diagnosis present

## 2016-06-22 DIAGNOSIS — I10 Essential (primary) hypertension: Secondary | ICD-10-CM | POA: Diagnosis not present

## 2016-06-22 DIAGNOSIS — R911 Solitary pulmonary nodule: Secondary | ICD-10-CM | POA: Diagnosis present

## 2016-06-22 DIAGNOSIS — B9620 Unspecified Escherichia coli [E. coli] as the cause of diseases classified elsewhere: Secondary | ICD-10-CM | POA: Diagnosis present

## 2016-06-22 DIAGNOSIS — C680 Malignant neoplasm of urethra: Secondary | ICD-10-CM

## 2016-06-22 DIAGNOSIS — E785 Hyperlipidemia, unspecified: Secondary | ICD-10-CM

## 2016-06-22 DIAGNOSIS — I130 Hypertensive heart and chronic kidney disease with heart failure and stage 1 through stage 4 chronic kidney disease, or unspecified chronic kidney disease: Secondary | ICD-10-CM | POA: Diagnosis present

## 2016-06-22 DIAGNOSIS — C679 Malignant neoplasm of bladder, unspecified: Secondary | ICD-10-CM | POA: Diagnosis not present

## 2016-06-22 DIAGNOSIS — C689 Malignant neoplasm of urinary organ, unspecified: Secondary | ICD-10-CM | POA: Diagnosis present

## 2016-06-22 DIAGNOSIS — I440 Atrioventricular block, first degree: Secondary | ICD-10-CM | POA: Diagnosis present

## 2016-06-22 DIAGNOSIS — N401 Enlarged prostate with lower urinary tract symptoms: Secondary | ICD-10-CM | POA: Diagnosis present

## 2016-06-22 DIAGNOSIS — N182 Chronic kidney disease, stage 2 (mild): Secondary | ICD-10-CM | POA: Diagnosis present

## 2016-06-22 DIAGNOSIS — I712 Thoracic aortic aneurysm, without rupture: Secondary | ICD-10-CM | POA: Diagnosis present

## 2016-06-22 DIAGNOSIS — R918 Other nonspecific abnormal finding of lung field: Secondary | ICD-10-CM | POA: Diagnosis not present

## 2016-06-22 DIAGNOSIS — E669 Obesity, unspecified: Secondary | ICD-10-CM | POA: Diagnosis present

## 2016-06-22 DIAGNOSIS — I5032 Chronic diastolic (congestive) heart failure: Secondary | ICD-10-CM | POA: Diagnosis not present

## 2016-06-22 DIAGNOSIS — R339 Retention of urine, unspecified: Secondary | ICD-10-CM | POA: Diagnosis not present

## 2016-06-22 DIAGNOSIS — R001 Bradycardia, unspecified: Secondary | ICD-10-CM | POA: Diagnosis present

## 2016-06-22 LAB — LIPID PANEL
CHOL/HDL RATIO: 2.9 ratio
CHOLESTEROL: 122 mg/dL (ref 0–200)
HDL: 42 mg/dL (ref >40)
LDL CALC: 36 mg/dL (ref 0–99)
Triglycerides: 220 mg/dL — ABNORMAL HIGH (ref <150)
VLDL: 44 mg/dL — AB (ref 0–40)

## 2016-06-22 LAB — HEMOGLOBIN A1C
Hgb A1c MFr Bld: 6 % — ABNORMAL HIGH (ref 4.8–5.6)
Mean Plasma Glucose: 126 mg/dL

## 2016-06-22 MED ORDER — IOPAMIDOL (ISOVUE-300) INJECTION 61%
INTRAVENOUS | Status: AC
  Administered 2016-06-22: 09:00:00
  Filled 2016-06-22: qty 30

## 2016-06-22 MED ORDER — IOPAMIDOL (ISOVUE-300) INJECTION 61%
INTRAVENOUS | Status: AC
  Administered 2016-06-22: 100 mL via INTRAVENOUS
  Filled 2016-06-22: qty 100

## 2016-06-22 NOTE — Progress Notes (Signed)
*  PRELIMINARY RESULTS* Vascular Ultrasound Bilateral lower extremity venous duplex has been completed.  Preliminary findings: No evidence of deep vein thrombosis or baker's cysts bilaterally.   [name] [name] 06/22/2016, 2:52 PM

## 2016-06-22 NOTE — Care Management Note (Signed)
Case Management Note  Patient Details  Name: [name] [name] MRN: [id number] Date of Birth: 05/28/1940  Subjective/Objective:   Patient admitted with CVA. He is from home with his spouse.                  Action/Plan: Awaiting PT/OT recommendations. CM following for d/c needs, physician orders.   Expected Discharge Date:                  Expected Discharge Plan:     In-House Referral:     Discharge planning Services     Post Acute Care Choice:    Choice offered to:     DME Arranged:    DME Agency:     HH Arranged:    HH Agency:     Status of Service:  In process, will continue to follow  If discussed at Long Length of Stay Meetings, dates discussed:    Additional Comments:  EMMETT BRACKNELL, RN 06/22/2016, 10:57 AM

## 2016-06-22 NOTE — Progress Notes (Signed)
Loop recorder interrogated. No AF since last interrogation. Sent to device clinic for review.  [name] [name] for Pager information 06/22/2016 3:48 PM

## 2016-06-22 NOTE — Progress Notes (Signed)
Re: carotid duplex  CTA neck from 04/04/16 with carotids WNL. Please advise if carotid duplex is still needed at this time.   Landry Mellow, [place], RVT Vascular lab [phone]

## 2016-06-22 NOTE — Telephone Encounter (Signed)
EE please advise if you have the results of the ONO.  thanks

## 2016-06-22 NOTE — Progress Notes (Signed)
CM asked to do benefits check on Eliquis for the patient. The benefits check revealed:    KELLY @ [place] SCRIPT# [phone]   6. ELIQUIS 5 MG BID   COVER- YES  CO-PAY- $ 24.00  PRIOR APPROVAL-NO   PREFERRED PHARMACY : [facility]  AFTER THE SECOND REFILL WILL NEED TO DO MAIL-ORDER  90 DAYS SUPPLY $ 20.00

## 2016-06-22 NOTE — Evaluation (Signed)
Physical Therapy Evaluation and Discharge Patient Details Name: [name] [name] MRN: [id number] DOB: [birth year] Today's Date: 06/22/2016   History of Present Illness  Pt is a 76 yo male admitted to the ED for expressive aphasia. Past medical history significant for CAD, CHF, COPD, and history of ischemic stroke most recently in April 2018. CT reveals chronic ischemic changes, and MRI reveals small acute infarct in the left lateral parital lobe.   Clinical Impression  Pt presents with overall good functional mobility. Prior to admission pt was independent for all ADLs and used a rollator for community ambulation. Pt able to tolerate 34ft of gait without any AD. Pt also presents with good balance during dynamic gait and dual tasking. PT provided pt with stroke education and recognizing symptoms. No PT follow up recommended due to pt demonstrating good functional mobility and safety awareness.     Follow Up Recommendations No PT follow up;Supervision - Intermittent    Equipment Recommendations  None recommended by PT    Recommendations for Other Services       Precautions / Restrictions Precautions Precautions: Fall Restrictions Weight Bearing Restrictions: No      Mobility  Bed Mobility Overal bed mobility: Modified Independent             General bed mobility comments: Use of bed rails for supine to sit   Transfers Overall transfer level: Needs assistance Equipment used: None Transfers: Sit to/from Stand Sit to Stand: Supervision         General transfer comment: supervision for safety   Ambulation/Gait Ambulation/Gait assistance: Supervision Ambulation Distance (Feet): 350 Feet Assistive device: None Gait Pattern/deviations: WFL(Within Functional Limits)   Gait velocity interpretation: at or above normal speed for age/gender General Gait Details: No LOB or instability during gait. Pt able to complete dual task activities during gait without LOB  Stairs            Wheelchair Mobility    Modified Rankin (Stroke Patients Only) Modified Rankin (Stroke Patients Only) Pre-Morbid Rankin Score: No symptoms Modified Rankin: No significant disability     Balance Overall balance assessment: No apparent balance deficits (not formally assessed) Sitting-balance support: No upper extremity supported;Feet supported Sitting balance-Leahy Scale: Good     Standing balance support: No upper extremity supported;During functional activity Standing balance-Leahy Scale: Good               High level balance activites: Direction changes;Turns;Sudden stops;Head turns High Level Balance Comments: Pt demonstrated good balance during components of the DGI.             Pertinent Vitals/Pain Pain Assessment: No/denies pain    Home Living Family/patient expects to be discharged to:: Private residence Living Arrangements: Spouse/significant other Available Help at Discharge: Family Type of Home: House Home Access: Level entry     Home Layout: One level Home Equipment: Environmental consultant - 4 wheels;Cane - single point;Shower seat - built in Agricultural consultant)      Prior Function Level of Independence: Independent with assistive device(s)         Comments: uses rollator for community ambulation     Hand Dominance   Dominant Hand: Right    Extremity/Trunk Assessment   Upper Extremity Assessment Upper Extremity Assessment: Defer to OT evaluation    Lower Extremity Assessment Lower Extremity Assessment: Overall WFL for tasks assessed       Communication   Communication: No difficulties  Cognition Arousal/Alertness: Awake/alert Behavior During Therapy: WFL for tasks assessed/performed Overall Cognitive Status: Within Functional  Limits for tasks assessed                                 General Comments: Pt with minimal expressive aphasia during conversation, but able to find correct words with increased time.      General  Comments General comments (skin integrity, edema, etc.): PT provided pt with stroke education in order to recognize symptoms.     Exercises     Assessment/Plan    PT Assessment Patent does not need any further PT services  PT Problem List         PT Treatment Interventions      PT Goals (Current goals can be found in the Care Plan section)  Acute Rehab PT Goals Patient Stated Goal: to go home PT Goal Formulation: With patient Time For Goal Achievement: 07/06/16 Potential to Achieve Goals: Good    Frequency     Barriers to discharge        Co-evaluation               AM-PAC PT "6 Clicks" Daily Activity  Outcome Measure Difficulty turning over in bed (including adjusting bedclothes, sheets and blankets)?: None Difficulty moving from lying on back to sitting on the side of the bed? : None Difficulty sitting down on and standing up from a chair with arms (e.g., wheelchair, bedside commode, etc,.)?: None Help needed moving to and from a bed to chair (including a wheelchair)?: A Little Help needed walking in hospital room?: A Little Help needed climbing 3-5 steps with a railing? : A Little 6 Click Score: 21    End of Session Equipment Utilized During Treatment: Gait belt Activity Tolerance: Patient tolerated treatment well Patient left: in bed;with call bell/phone within reach;with bed alarm set;with family/visitor present Nurse Communication: Mobility status PT Visit Diagnosis: Unsteadiness on feet (R26.81);Other symptoms and signs involving the nervous system (R29.898)    Time: 9470-9628 PT Time Calculation (min) (ACUTE ONLY): 14 min   Charges:   PT Evaluation $PT Eval Low Complexity: 1 Procedure     PT G Codes:   PT G-Codes **NOT FOR INPATIENT CLASS** Functional Assessment Tool Used: Clinical judgement Functional Limitation: Mobility: Walking and moving around Mobility: Walking and Moving Around Current Status (Z6629): At least 1 percent but less than 20  percent impaired, limited or restricted Mobility: Walking and Moving Around Goal Status [phone]): At least 1 percent but less than 20 percent impaired, limited or restricted Mobility: Walking and Moving Around Discharge Status [phone]): At least 1 percent but less than 20 percent impaired, limited or restricted    Loma Sousa, SPT  [phone]  Loma Sousa 06/22/2016, 1:44 PM

## 2016-06-22 NOTE — Progress Notes (Addendum)
Occupational Therapy Evaluation Patient Details Name: [name] [name] MRN: [id number] DOB: Dec 20, 1940 Today's Date: 06/22/2016    History of Present Illness Pt is a 76 yo male admitted to the ED for expressive aphasia. Past medical history significant for CAD, CHF, COPD, and history of ischemic stroke most recently in April 2018. CT reveals chronic ischemic changes, and MRI reveals small acute infarct in the left lateral parital lobe.    Clinical Impression   PTA, pt independent with ADL and mobility. Pt presents with deficits with communication in addition to visuospatial deficits as noted by inability to complete portions of the [facility] Cognitive Assessment).Recommend pt follow up with OT at the neuro outpt center. All further OT to be addressed in outpt setting.     Follow Up Recommendations  Outpatient OT;Supervision/Assistance - 24 hour (initially)    Equipment Recommendations  None recommended by OT    Recommendations for Other Services       Precautions / Restrictions Precautions Precautions: Fall Restrictions Weight Bearing Restrictions: No      Mobility Bed Mobility Overal bed mobility: Modified Independent                Transfers Overall transfer level: Modified independent                    Balance Overall balance assessment: No apparent balance deficits (not formally assessed)                                         ADL either performed or assessed with clinical judgement   ADL Overall ADL's : Needs assistance/impaired                                     Functional mobility during ADLs: Supervision/safety General ADL Comments: Able to complete basic ADL tasks with set up. Pt has difficulty with IADL tasks, i.e. medication management, financial management. trail making.  Pt does not drive     Vision Baseline Vision/History: Wears glasses Wears Glasses: Reading only Additional Comments: will  further assess. Visual fields appear intact     Perception Perception Comments: visuospatial deficits noted. Pt unable ot complete cube copy or complete clock copy task. Will further assess   Praxis Praxis Praxis tested?: Within functional limits    Pertinent Vitals/Pain Pain Assessment: No/denies pain     Hand Dominance Right   Extremity/Trunk Assessment Upper Extremity Assessment Upper Extremity Assessment: Overall WFL for tasks assessed   Lower Extremity Assessment Lower Extremity Assessment: Defer to PT evaluation   Cervical / Trunk Assessment Cervical / Trunk Assessment: Normal   Communication Communication Communication: Expressive difficulties;Receptive difficulties   Cognition Arousal/Alertness: Awake/alert Behavior During Therapy: WFL for tasks assessed/performed Overall Cognitive Status: Impaired/Different from baseline [facility] for basic tasks, limited assessment due to aphasia) Area of Impairment: Memory;Awareness    Attempted to assess using the [place]. It took approximately 15 min to complete the visuospatial section. Pt unable to complete trail making task, copy a cube or complete clock drawing activity. Pt frustrated and was able to communicate that these were new deficits. Wife agreed.  Discussed need for wife to assist with medication management and financial mangement due to these deficits.  General Comments       Exercises     Shoulder Instructions      Home Living Family/patient expects to be discharged to:: Private residence Living Arrangements: Spouse/significant other Available Help at Discharge: Family Type of Home: House Home Access: Level entry     [place]: One level     Bathroom Shower/Tub: Occupational psychologist: [place]: Environmental consultant - 4 wheels;Cane - single point;Shower seat - built in Agricultural consultant)      Lives With: Spouse    Prior Functioning/Environment Level  of Independence: Independent with assistive device(s)        Comments: uses rollator for community ambulation        OT Problem List: Impaired vision/perception;Decreased cognition;Decreased safety awareness      OT Treatment/Interventions:      OT Goals(Current goals can be found in the care plan section) Acute Rehab OT Goals Patient Stated Goal: to go home OT Goal Formulation: All assessment and education complete, DC therapy (continue with outpt OT)  OT Frequency:     Barriers to D/C:            Co-evaluation              AM-PAC PT "6 Clicks" Daily Activity     Outcome Measure Help from another person eating meals?: None Help from another person taking care of personal grooming?: None Help from another person toileting, which includes using toliet, bedpan, or urinal?: None Help from another person bathing (including washing, rinsing, drying)?: None Help from another person to put on and taking off regular upper body clothing?: None Help from another person to put on and taking off regular lower body clothing?: None 6 Click Score: 24   End of Session Nurse Communication: Other (comment) (DC needs)  Activity Tolerance: Patient tolerated treatment well Patient left: in bed;with call bell/phone within reach;with family/visitor present  OT Visit Diagnosis: Other symptoms and signs involving the nervous system (R29.898);Other symptoms and signs involving cognitive function;Cognitive communication deficit (R41.841) Symptoms and signs involving cognitive functions: Cerebral infarction                Time: 6415-8309 OT Time Calculation (min): 27 min Charges:  OT General Charges $OT Visit: 1 Procedure OT Evaluation $OT Eval Moderate Complexity: 1 Procedure OT Treatments $Therapeutic Activity: 8-22 mins G-Codes: OT G-codes **NOT FOR INPATIENT CLASS** Functional Assessment Tool Used: Clinical judgement Functional Limitation: Self care Self Care Current Status  (M0768): At least 1 percent but less than 20 percent impaired, limited or restricted Self Care Goal Status (G8811): At least 1 percent but less than 20 percent impaired, limited or restricted Self Care Discharge Status [phone]): At least 1 percent but less than 20 percent impaired, limited or restricted   [facility], OT/L  [phone] 06/22/2016  Angelyse Heslin,HILLARY 06/22/2016, 6:04 PM

## 2016-06-22 NOTE — Progress Notes (Signed)
[facility] PROGRESS NOTE  [name] [name] QJJ:[id number] DOB: Sep 12, 1940 DOA: 06/21/2016 PCP: [name], MD  Interim summary and history of present illness 76 year old male with history of 2 prior strokes, coronary artery disease, hypertension, hyperlipidemia, chronic kidney disease stage II, COPD, bladder cancer and BPH status post TURP; who presented to the hospital secondary to expressive aphasia and worsening left facial droop. Found to have small acute/early subacute infarct within the left lateral parietal lobe.  Assessment/Plan: 1-acute ischemic stroke -Will complete stroke workup and follow neurology recommendations -Patient with 3rd stroke currently -Last one in April 2018; at that time patient was switched from Plavix to Aggrenox. -Loop recorder has been evaluated and no atrial fibrillation has been recorded since implantation in April 2018. -After discussing with Dr. Erlinda Hong, most likely the plan will be for use of anticoagulation Brandy Hale or Xarelto). Prior to final recommendations will complete her workup. -Patient with residual expressive aphasia from this acute stroke. -Will continue PT, OT, SPL.  2-coronary artery disease -No chest pain, no shortness of breath -Will continue statins and Aggrenox for now -Depending final secondary prevention from a stroke standpoint with the need to use a baby aspirin on daily basis. -Holding ARB and beta blocker to decrease the chances of poor perfusion in the acute setting of CVA.  3-hypertension -Will be permissive with high blood pressure in the setting of acute CVA -Holding ARB, Norvasc and Zebeta -Resumed in the next 5 days to achieve a normotensive state.  4-chronic kidney disease stage II -Serum creatinine on admission 1.17 (which is better than prior creatinine levels). -Will monitor treatment and avoid nephrotoxic agents.  5-chronic diastolic heart failure -Compensated -Will continue Lasix -Follow daily  weights and strict intake and output  6-chronic urinary retention -Attributed to low grade urothelial carcinoma and BPH that is managed at [facility], along with presumed multiple sclerosis -Patient manage condition at home with in and out cath 3 times a day -Will continue in and out cath -Continue Cardura  7-GERD -Continue PPI  8-depression -Continue Effexor  9-COPD: -Compensated and stable  -no wheezing on exam -Will continue spiriva and albuterol  10-hyperlipidemia  -continue Lipitor -LDL at goal  Code Status: Full code Family Communication: No family at bedside Disposition Plan: Will complete stroke workup. Currently pending A1c, carotid Doppler's, after discussing with neurology service 2-D echo has been discontinued (patient with recent echo in April). Will follow further neurology recommendations.   Consultants:  Neurology  Procedures:  See below for x-ray reports  Carotid duplex: Pending  Lower extremity duplex: No evidence of deep vein thrombosis or Baker's cyst bilaterally.  Antibiotics:  None  HPI/Subjective: Afebrile, no chest pain, shortness of breath. Patient with expressive aphasia and facial droop (which he endorses is chronic and unchanged from prior stroke). Patient denies any other acute complaints.   Objective: Vitals:   06/22/16 1414 06/22/16 1833  BP: 119/63 (!) 131/59  Pulse: 63 73  Resp: 16 17  Temp: 97.5 F (36.4 C) 98.6 F (37 C)    Intake/Output Summary (Last 24 hours) at 06/22/16 1851 Last data filed at 06/21/16 2100  Gross per 24 hour  Intake                0 ml  Output              400 ml  Net             -400 ml   Filed Weights   06/21/16 1103 06/21/16  2013  Weight: 99.8 kg (220 lb) 97.8 kg (215 lb 9.8 oz)    Exam:   General:  Afebrile, denies chest pain, no shortness of breath, no palpitations, no abdominal pain, nausea or vomiting. Patient stroke with expressive aphasia and otherwise denying any other motor or  neurologic deficit.  Cardiovascular: S1 and S2, no rubs, no gallops, no JVD  Respiratory: Good air movement bilaterally, no wheezing, no crackles, good oxygen saturation on room air, normal respiratory effort.  Abdomen: Soft, nontender, nondistended, positive bowel sounds, no guarding.  Musculoskeletal: No cyanosis, no clubbing, full range of motion and no edema.  Neurologic exam: Patient alert, awake and oriented 3, cranial nerves grossly intact, normal deep tendon reflexes on exam, no drift, muscular strength 5 over 5 bilaterally symmetrically in all 4 limbs. Patient with expressive aphasia and chronic facial droop from prior stroke.   Data Reviewed: Basic Metabolic Panel:  Recent Labs Lab 06/18/16 0918 06/21/16 1118 06/21/16 1144  NA 135 135 136  K 4.0 4.6 4.7  CL 100 101 99*  CO2 22 26  --   GLUCOSE 129* 97 93  BUN 18 13 14   CREATININE 1.27* 1.17 1.10  CALCIUM 9.3 9.0  --    Liver Function Tests:  Recent Labs Lab 06/21/16 1118  AST 22  ALT 16*  ALKPHOS 76  BILITOT 0.6  PROT 7.1  ALBUMIN 4.0   CBC:  Recent Labs Lab 06/21/16 1118 06/21/16 1144  WBC 7.8  --   NEUTROABS 5.1  --   HGB 13.9 14.3  HCT 42.3 42.0  MCV 98.4  --   PLT 312  --    ProBNP (last 3 results)  Recent Labs  03/30/16 1047 04/07/16 1112  PROBNP 386* 117    Studies: Ct Head Wo Contrast  Result Date: 06/21/2016 CLINICAL DATA:  Left-sided facial droop and altered mental status EXAM: CT HEAD WITHOUT CONTRAST TECHNIQUE: Contiguous axial images were obtained from the base of the skull through the vertex without intravenous contrast. COMPARISON:  04/05/2016 FINDINGS: Brain: Diffuse chronic white matter ischemic changes are again identified and similar to that seen on prior MRI examination. Lacunar infarct is noted on the right in the frontal lobe stable from the prior study. Recently seen cerebellar infarcts are not as well appreciated on this exam as on the recent MRI. Vascular: No  hyperdense vessel or unexpected calcification. Skull: Normal. Negative for fracture or focal lesion. Sinuses/Orbits: No acute finding. Other: None. IMPRESSION: Chronic ischemic changes and mild atrophic changes similar to that seen on previous exam. No acute abnormality is identified. Electronically Signed   By: Inez Catalina M.D.   On: 06/21/2016 12:28   Ct Chest W Contrast  Result Date: 06/22/2016 CLINICAL DATA:  Low-grade urothelial carcinoma. EXAM: CT CHEST, ABDOMEN, AND PELVIS WITH CONTRAST TECHNIQUE: Multidetector CT imaging of the chest, abdomen and pelvis was performed following the standard protocol during bolus administration of intravenous contrast. CONTRAST:  149mL ISOVUE-300 IOPAMIDOL (ISOVUE-300) INJECTION 61% COMPARISON:  CT chest from 04/28/2016 and CT chest from 06/05/2015. PET-CT from 12/11/2013. FINDINGS: CT CHEST FINDINGS Cardiovascular: The heart size appears within normal limits. No pericardial effusion. Aortic atherosclerosis noted. Aortic atherosclerosis. Stable fusiform dilatation of the ascending thoracic aorta measuring 4 cm, image 32 of series 3. Mediastinum/Nodes: The trachea appears patent and is midline. Normal appearance of the esophagus. Sub- carinal lymph node measures 11 mm, image 33 of series 3. Previously 7 mm. No hilar adenopathy. No axillary or supraclavicular adenopathy. Lungs/Pleura: No pleural effusion  identified. Right middle lobe perifissural nodule is unchanged measuring 6 mm, image 36 of series 4. Nodular density within the right middle lobe with surrounding architectural distortion measures 1.2 x 2.1 cm, image 42 of series 5. Unchanged from 06/05/2015. Adjacent nodular density measures 1.9 by 1.1 cm, image 44 of series 5. Previously 2.9 x 1.8 cm. Stable lingular scarring. No change in subpleural nodule within the left lower lobe measuring 4 mm, image 44 of series 5. Musculoskeletal: Degenerative changes noted in the right glenohumeral joint. No aggressive lytic or  sclerotic bone lesions identified. CT ABDOMEN PELVIS FINDINGS Hepatobiliary: No focal liver abnormality is seen. No gallstones, gallbladder wall thickening, or biliary dilatation. Pancreas: Unremarkable. No pancreatic ductal dilatation or surrounding inflammatory changes. Spleen: Normal in size without focal abnormality. Adrenals/Urinary Tract: The adrenal glands appear normal. No mass or hydronephrosis. Exophytic structure arising from the anterolateral cortex of the right mid kidney measures 9 mm and is too small to characterize. No hydronephrosis identified. No hydroureter. The urinary bladder appears unremarkable. Stomach/Bowel: The stomach is normal. The small bowel loops have a normal course and caliber. Normal appearance of the colon. Vascular/Lymphatic: Aortic atherosclerosis. No upper abdominal adenopathy. No pelvic or inguinal adenopathy. Reproductive: Prostate is unremarkable. Other: No abdominal wall hernia or abnormality. No abdominopelvic ascites. Musculoskeletal: No suspicious bone lesions. Degenerative disc disease identified. L2-3 vertebral fusion may be postsurgical or congenital. IMPRESSION: 1. Solid nodule within the perifissural right middle lobe is stable from previous exam. The nodular densities within the right middle lobe have decreased in size when compared with 06/05/15 favoring a benign process. 2. Borderline enlarged sub- carinal lymph node appears mildly increased in size from previous exam. 11 mm on today's study versus 7 mm previously. 3. No mass or adenopathy identified within the abdomen or pelvis. 4.  Aortic Atherosclerosis (ICD10-I70.0). Electronically Signed   By: Kerby Moors M.D.   On: 06/22/2016 12:46   Mr Brain Wo Contrast  Result Date: 06/21/2016 CLINICAL DATA:  76 y/o  M; expressive aphasia. EXAM: MRI HEAD WITHOUT CONTRAST TECHNIQUE: Multiplanar, multiecho pulse sequences of the brain and surrounding structures were obtained without intravenous contrast. COMPARISON:   06/21/2016 CT of the head. FINDINGS: Brain: Small region of left parietal cortical three views diffusion and additional subcentimeter focus in the left superior parietal lobe compatible with acute/ early subacute infarction. Areas of reduced diffusion are associated with mild T2 FLAIR hyperintense signal abnormality. There is no significant mass effect or associated hemorrhage. Background of moderate chronic microvascular ischemic changes and parenchymal volume loss of the brain. Small chronic infarction and right frontal periventricular white matter. Small chronic lacunar infarcts are present in bilateral cerebellar hemispheres, bilateral thalamus, lentiform nuclei, and the right caudate body. No extra-axial collection, hydrocephalus, or herniation. Vascular: Normal flow voids. Skull and upper cervical spine: Normal marrow signal. Sinuses/Orbits: Negative. Other: None. IMPRESSION: 1. Small acute/early subacute infarct within the left lateral parietal lobe and subcentimeter focus in left superior parietal lobe. No hemorrhage or significant associated mass effect. 2. Background of moderate chronic microvascular ischemic changes and parenchymal volume loss of the brain. Several chronic lacunar infarcts in basal ganglia and cerebellum. These results will be called to the ordering clinician or representative by the Radiologist Assistant, and communication documented in the PACS or zVision Dashboard. Electronically Signed   By: Kristine Garbe M.D.   On: 06/21/2016 19:28   Ct Abdomen Pelvis W Contrast  Result Date: 06/22/2016 CLINICAL DATA:  Low-grade urothelial carcinoma. EXAM: CT CHEST, ABDOMEN, AND PELVIS  WITH CONTRAST TECHNIQUE: Multidetector CT imaging of the chest, abdomen and pelvis was performed following the standard protocol during bolus administration of intravenous contrast. CONTRAST:  174mL ISOVUE-300 IOPAMIDOL (ISOVUE-300) INJECTION 61% COMPARISON:  CT chest from 04/28/2016 and CT chest from  06/05/2015. PET-CT from 12/11/2013. FINDINGS: CT CHEST FINDINGS Cardiovascular: The heart size appears within normal limits. No pericardial effusion. Aortic atherosclerosis noted. Aortic atherosclerosis. Stable fusiform dilatation of the ascending thoracic aorta measuring 4 cm, image 32 of series 3. Mediastinum/Nodes: The trachea appears patent and is midline. Normal appearance of the esophagus. Sub- carinal lymph node measures 11 mm, image 33 of series 3. Previously 7 mm. No hilar adenopathy. No axillary or supraclavicular adenopathy. Lungs/Pleura: No pleural effusion identified. Right middle lobe perifissural nodule is unchanged measuring 6 mm, image 36 of series 4. Nodular density within the right middle lobe with surrounding architectural distortion measures 1.2 x 2.1 cm, image 42 of series 5. Unchanged from 06/05/2015. Adjacent nodular density measures 1.9 by 1.1 cm, image 44 of series 5. Previously 2.9 x 1.8 cm. Stable lingular scarring. No change in subpleural nodule within the left lower lobe measuring 4 mm, image 44 of series 5. Musculoskeletal: Degenerative changes noted in the right glenohumeral joint. No aggressive lytic or sclerotic bone lesions identified. CT ABDOMEN PELVIS FINDINGS Hepatobiliary: No focal liver abnormality is seen. No gallstones, gallbladder wall thickening, or biliary dilatation. Pancreas: Unremarkable. No pancreatic ductal dilatation or surrounding inflammatory changes. Spleen: Normal in size without focal abnormality. Adrenals/Urinary Tract: The adrenal glands appear normal. No mass or hydronephrosis. Exophytic structure arising from the anterolateral cortex of the right mid kidney measures 9 mm and is too small to characterize. No hydronephrosis identified. No hydroureter. The urinary bladder appears unremarkable. Stomach/Bowel: The stomach is normal. The small bowel loops have a normal course and caliber. Normal appearance of the colon. Vascular/Lymphatic: Aortic atherosclerosis.  No upper abdominal adenopathy. No pelvic or inguinal adenopathy. Reproductive: Prostate is unremarkable. Other: No abdominal wall hernia or abnormality. No abdominopelvic ascites. Musculoskeletal: No suspicious bone lesions. Degenerative disc disease identified. L2-3 vertebral fusion may be postsurgical or congenital. IMPRESSION: 1. Solid nodule within the perifissural right middle lobe is stable from previous exam. The nodular densities within the right middle lobe have decreased in size when compared with 06/05/15 favoring a benign process. 2. Borderline enlarged sub- carinal lymph node appears mildly increased in size from previous exam. 11 mm on today's study versus 7 mm previously. 3. No mass or adenopathy identified within the abdomen or pelvis. 4.  Aortic Atherosclerosis (ICD10-I70.0). Electronically Signed   By: Kerby Moors M.D.   On: 06/22/2016 12:46   Mr Jodene Nam Head/brain NI Cm  Result Date: 06/22/2016 CLINICAL DATA:  Initial evaluation for slurred speech, altered mental status. EXAM: MRA HEAD WITHOUT CONTRAST TECHNIQUE: Angiographic images of the Circle of Willis were obtained using MRA technique without intravenous contrast. COMPARISON:  Prior MRI from earlier same day. FINDINGS: ANTERIOR CIRCULATION: Study degraded by motion artifact. Distal cervical segments of the internal carotid arteries are patent with antegrade flow. Petrous, cavernous, and supraclinoid segments patent without flow-limiting stenosis. ICA termini widely patent. A1 segments patent bilaterally. Anterior communicating artery normal. Anterior cerebral arteries patent to their distal aspects. M1 segments patent without stenosis or occlusion. No proximal M2 occlusion. Distal MCA branches well opacified and symmetric. POSTERIOR CIRCULATION: Vertebral arteries patent to the vertebrobasilar junction without stenosis. Posterior inferior cerebral arteries patent proximally. Basilar artery widely patent to its distal aspect. Superior  cerebellar is patent bilaterally.  Both of the posterior cerebral artery supplied via the basilar and are widely patent to their distal aspects. No aneurysm or vascular malformation. IMPRESSION: Negative intracranial MRA. No large or proximal arterial branch occlusion. No high-grade or correctable stenosis. Electronically Signed   By: Jeannine Boga M.D.   On: 06/22/2016 00:21    Scheduled Meds: . amantadine  100 mg Oral BID  . atorvastatin  40 mg Oral q1800  . cholecalciferol  400 Units Oral Daily  . dipyridamole-aspirin  1 capsule Oral BID  . doxazosin  2 mg Oral Daily  . enoxaparin (LOVENOX) injection  40 mg Subcutaneous Q24H  . folic acid  1 mg Oral Daily  . furosemide  40 mg Oral Daily  . pantoprazole  40 mg Oral Daily  . rOPINIRole  1 mg Oral QHS  . tiotropium  18 mcg Inhalation Daily  . venlafaxine XR  150 mg Oral BID  . vitamin B-12  1,000 mcg Oral Daily    Time spent: 35 minutes   Barton Dubois  [facility] Pager [phone]. If 7PM-7AM, please contact night-coverage at [email or web address], password [facility] 06/22/2016, 6:51 PM  LOS: 0 days

## 2016-06-22 NOTE — Evaluation (Signed)
Speech Language Pathology Evaluation Patient Details Name: [name] [name] MRN: [id number] DOB: [birth year] Today's Date: 06/22/2016 Time: 8022-3361 SLP Time Calculation (min) (ACUTE ONLY): 31 min  Problem List:  Patient Active Problem List   Diagnosis Date Noted  . Stroke (cerebrum) ([place]) 06/21/2016  . Expressive aphasia   . Essential hypertension 05/17/2016  . Family history of stroke 04/06/2016  . Cognitive deficits 04/06/2016  . Cerebrovascular accident (CVA) due to embolism of cerebral artery ([place]) 04/04/2016  . Syncope and collapse   . Dysarthria   . Acute cystitis without hematuria   . History of ischemic right MCA stroke   . Slurred speech 06/20/2015  . AKI (acute kidney injury) ([place]) 06/20/2015  . Thoracic aortic aneurysm ([place])   . Hyperlipidemia 06/05/2015  . Gastroesophageal reflux disease without esophagitis 06/05/2015  . COPD (chronic obstructive pulmonary disease) ([place]) 09/04/2013  . Lung nodule 09/04/2013  . Cancer ([place])   . Coronary artery disease involving native coronary artery of native heart without angina pectoris 02/01/2013  . Chronic diastolic CHF (congestive heart failure) ([place]) 01/03/2013  . Depression   . Urinary retention with incomplete bladder emptying   . BPH (benign prostatic hyperplasia)   . Brugada syndrome 08/23/2011  . Hypertensive heart disease with CHF (congestive heart failure) ([place]) 04/08/2009  . Mitral valve disorder 04/08/2009  . CAROTID BRUIT, RIGHT 03/06/2009  . SHORTNESS OF BREATH 03/06/2009  . OTHER CHEST PAIN 03/06/2009  . Possible Multiple sclerosis ([place]) 10/11/2006   Past Medical History:  Past Medical History:  Diagnosis Date  . Arthritis    s/p TKR  . Bladder cancer (HCC)    [facility], [facility] low grade papillary urothileal carcinoma  . BPH (benign prostatic hyperplasia)   . Brugada syndrome    Possible Type II Brugada ECG pattern. No family history of SCD, no syncope, no tachypalpitations.  Marland Kitchen CAD (coronary artery disease)     a. LHC 1/15 - mid LCx 50 and 60%, proximal RCA 50%  . Chronic diastolic CHF (congestive heart failure) ([place])   . CKD (chronic kidney disease)   . COPD (chronic obstructive pulmonary disease) ([place])    Prior heavy smoker. PFTs (3/11): FVC 87%, FEV1 73%, ratio 0.57, DLCO 75%, TLC 121%. Moderate obstructive defect. PFTs (9/15): Only minimal obstruction, sugPrior heavy smoker. PFTs (3/11): FVC 87%, FEV1 73%, ratio 0.57, DLCO 75%, TLC 121%. Moderate obstructive defect. PFTs (9/15) minimal obstruction, poss asthma component   . Depression    with bipolar tendencies  . DOE (dyspnea on exertion)    a. Myoview 8/09- EF 57%, no ischemia // b. Myoview (3/11) EF 68%, diaphragmatic attenuation, no ischemia //  c Echo (4/11) EF 22-44%, mild diastolic dysfunction, PASP 38 mmHg // d. PFTs 9/15 minimal obstruction  /  e. CT negative for ILD  . GERD (gastroesophageal reflux disease)   . History of Doppler ultrasound    Carotid US 3/11 negative for significant stenosis  //  carotid US 6/17: Mild bilateral plaque, 1-39% ICA  . History of echocardiogram    a. Echo 12/14 Inferior and distal septal HK, mild LVH, EF 50-55%, mild MR, mild LAE  //  b. Echo 6/17: Mild focal basal septal hypertrophy, EF 60-65%, normal wall motion, grade 1 diastolic dysfunction, mild AI  . HLD (hyperlipidemia)   . Hypertensive heart disease with CHF (congestive heart failure) ([place]) 04/08/2009  . Low testosterone   . Lung nodule    a. CT in 2015 >> PET in 12/15 not sugg of malignancy   .  LVH (left ventricular hypertrophy)    a. Echo 12/14 Inferior and distal septal HK, mild LVH, EF 50-55%, mild MR, mild LAE  . Mild dementia   . Mitral regurgitation    Echo (4/11) with PISA ERO 0.3 cm^2 and regurgitant volume 41 mL (moderate MR). Echo (12/14) with mild MR.   . MS (multiple sclerosis) ([place])   . MS (multiple sclerosis) ([place])   . Right bundle branch block   . Stroke ([place])   . Thoracic aortic aneurysm (HCC)    4.1 cm 2017  . Urinary  retention with incomplete bladder emptying    receives botox injections and treatment for BPH through [facility]   Past Surgical History:  Past Surgical History:  Procedure Laterality Date  . APPENDECTOMY    . EP IMPLANTABLE DEVICE N/A 06/23/2015   Procedure: Loop Recorder Insertion;  Surgeon: Deboraha Sprang, MD;  Location: [facility] CV LAB;  Service: Cardiovascular;  Laterality: N/A;  . HEMORRHOID SURGERY    . LEFT HEART CATHETERIZATION WITH CORONARY ANGIOGRAM N/A 01/05/2013   Procedure: LEFT HEART CATHETERIZATION WITH CORONARY ANGIOGRAM;  Surgeon: Larey Dresser, MD;  Location: [facility] CATH LAB;  Service: Cardiovascular;  Laterality: N/A;  . ROTATOR CUFF REPAIR    . TEE WITHOUT CARDIOVERSION N/A 06/23/2015   Procedure: TRANSESOPHAGEAL ECHOCARDIOGRAM (TEE);  Surgeon: Fay Records, MD;  Location: [facility] ENDOSCOPY;  Service: Cardiovascular;  Laterality: N/A;  . TONSILLECTOMY    . TOTAL KNEE ARTHROPLASTY Right   . TRANSURETHRAL RESECTION OF PROSTATE     HPI:  Pt is a 76 yo male admitted to the ED for expressive aphasia. Past medical history significant for CAD, CHF, COPD, and history of ischemic stroke most recently in April 2018. CT reveals chronic ischemic changes, and MRI reveals small acute infarct in the left lateral parital lobe.    Assessment / Plan / Recommendation Clinical Impression  Pt has an expressive > receptive aphasia, marked by anomia and phonemic/semantic paraphasias. Pt attempts to use circumlocution and gestures spontaneously when he has word finding difficulties. He was 70% accurate on confrontational naming task, increased to 90% accurate with sentence completion and phonemic cues provided by SLP. He understands simple questions and instructions well, but has difficulty following two-step commands, possibly due to reduced processing speed and working memory. SLP provided strategies to facilitate communication and word finding abilities, and provided some sample activities to practice  naming skills. He will benefit from OP SLP services to maximizes functional communication.     SLP Assessment  SLP Recommendation/Assessment: Patient needs continued Speech Lanaguage Pathology Services SLP Visit Diagnosis: Aphasia (R47.01)    Follow Up Recommendations  Outpatient SLP;24 hour supervision/assistance    Frequency and Duration min 2x/week  2 weeks      SLP Evaluation Cognition  Overall Cognitive Status: Difficult to assess [facility] for basic tasks, limited assessment due to aphasia)       Comprehension  Auditory Comprehension Overall Auditory Comprehension: Impaired Yes/No Questions: Within Functional Limits Commands: Impaired One Step Basic Commands: 75-100% accurate Two Step Basic Commands: 25-49% accurate Conversation: Simple Interfering Components: Working memory;Processing speed EffectiveTechniques: Repetition    Expression Expression Primary Mode of Expression: Verbal Verbal Expression Overall Verbal Expression: Impaired Initiation: No impairment Automatic Speech: Name;Social Response Level of Generative/Spontaneous Verbalization: Phrase;Sentence Naming: Impairment Confrontation: Impaired Verbal Errors: Semantic paraphasias;Phonemic paraphasias;Aware of errors Pragmatics: No impairment Effective Techniques: Sentence completion;Phonemic cues Non-Verbal Means of Communication: Not applicable Written Expression Dominant Hand: Right   Oral / Motor  Motor Speech  Overall Motor Speech: Appears within functional limits for tasks assessed   GO          Functional Assessment Tool Used: skilled clinical judgment Functional Limitations: Spoken language expressive Spoken Language Expression Current Status [phone]): At least 40 percent but less than 60 percent impaired, limited or restricted Spoken Language Expression Goal Status [phone]): At least 20 percent but less than 40 percent impaired, limited or restricted         Germain Osgood 06/22/2016, 2:23  PM  Germain Osgood, M.A. CCC-SLP [phone]

## 2016-06-22 NOTE — Care Management Obs Status (Signed)
[place] NOTIFICATION   Patient Details  Name: SAVIEN MAMULA MRN: [id number] Date of Birth: 06/16/1940   Medicare Observation Status Notification Given:  Yes    Carles Collet, RN 06/22/2016, 4:42 PM

## 2016-06-22 NOTE — Progress Notes (Signed)
STROKE TEAM PROGRESS NOTE   HISTORY OF PRESENT ILLNESS (per record) [name] [name] is an 76 y.o. male who presents with slurred speech and "AMS" from home. LKN was 06/20/2016  at 2100. Symptoms were first noticed this morning. On awakening, he was having difficulty completing sentences, could not state his birth date, the year or other items that his wife asked him to recollect. At time of initial ED presentation, his wife stated that he was improving. A "left facial droop" was noted in triage.   He has had 2 strokes in the past, most recently in April of this year.  He has had multiple strokes while on antiplatelet therapy with aspirin, plavix, and aggrenox.  His PMHx also includes CAD, BPH s/p TURP, CKD, COPD and multiple sclerosis. He self-catheterizes 3 times per day due to urinary retention from his MS and BPH.   Patient was not administered IV t-PA secondary to arriving outside of the treatment window. He was admitted to general neurology for further evaluation and treatment.   SUBJECTIVE (INTERVAL HISTORY) His wife is at the bedside.  The patient is awake, alert, and conversant with expressive aphasia and anomia. MRI showed left MCA infarct. Pan CT showed no metastasis. Family and pt interested in anticoagulation for stroke prevention.    OBJECTIVE Temp:  [97.5 F (36.4 C)-98.4 F (36.9 C)] 97.7 F (36.5 C) (06/19 0954) Pulse Rate:  [51-64] 59 (06/19 0954) Cardiac Rhythm: Normal sinus rhythm;Heart block (06/19 0800) Resp:  [10-20] 17 (06/19 0954) BP: (124-159)/(62-82) 158/69 (06/19 0954) SpO2:  [91 %-100 %] 98 % (06/19 0954) Weight:  [97.8 kg (215 lb 9.8 oz)-99.8 kg (220 lb)] 97.8 kg (215 lb 9.8 oz) (06/18 2013)  CBC:  Recent Labs Lab 06/21/16 1118 06/21/16 1144  WBC 7.8  --   NEUTROABS 5.1  --   HGB 13.9 14.3  HCT 42.3 42.0  MCV 98.4  --   PLT 312  --     Basic Metabolic Panel:  Recent Labs Lab 06/18/16 0918 06/21/16 1118 06/21/16 1144  NA 135 135 136  K 4.0  4.6 4.7  CL 100 101 99*  CO2 22 26  --   GLUCOSE 129* 97 93  BUN 18 13 14   CREATININE 1.27* 1.17 1.10  CALCIUM 9.3 9.0  --     Lipid Panel:    Component Value Date/Time   CHOL 122 06/22/2016 0607   TRIG 220 (H) 06/22/2016 0607   HDL 42 06/22/2016 0607   CHOLHDL 2.9 06/22/2016 0607   VLDL 44 (H) 06/22/2016 0607   LDLCALC 36 06/22/2016 0607   HgbA1c:  Lab Results  Component Value Date   HGBA1C 5.6 04/05/2016   Urine Drug Screen:    Component Value Date/Time   LABOPIA POSITIVE (A) 06/15/2015 1907   COCAINSCRNUR NONE DETECTED 06/15/2015 1907   LABBENZ NONE DETECTED 06/15/2015 1907   AMPHETMU NONE DETECTED 06/15/2015 1907   THCU NONE DETECTED 06/15/2015 1907   LABBARB NONE DETECTED 06/15/2015 1907    Alcohol Level     Component Value Date/Time   ETH <5 06/15/2015 1040    IMAGING I have personally reviewed the radiological images below and agree with the radiology interpretations.  Ct Head Wo Contrast  06/21/2016 IMPRESSION: Chronic ischemic changes and mild atrophic changes similar to that seen on previous exam. No acute abnormality is identified.   Ct Chest W Contrast Ct Abdomen Pelvis W Contrast 06/22/2016 IMPRESSION: 1. Solid nodule within the perifissural right middle lobe is stable from  previous exam. The nodular densities within the right middle lobe have decreased in size when compared with 06/05/15 favoring a benign process. 2. Borderline enlarged sub- carinal lymph node appears mildly increased in size from previous exam. 11 mm on today's study versus 7 mm previously. 3. No mass or adenopathy identified within the abdomen or pelvis. 4.  Aortic Atherosclerosis (ICD10-I70.0).  Mr Brain Wo Contrast 06/21/2016 IMPRESSION: 1. Small acute/early subacute infarct within the left lateral parietal lobe and subcentimeter focus in left superior parietal lobe. No hemorrhage or significant associated mass effect. 2. Background of moderate chronic microvascular ischemic changes  and parenchymal volume loss of the brain. Several chronic lacunar infarcts in basal ganglia and cerebellum.  Mr Jodene Nam Head/brain Wo Cm 06/22/2016 IMPRESSION: Negative intracranial MRA. No large or proximal arterial branch occlusion. No high-grade or correctable stenosis.  LE venous doppler - No evidence of deep vein thrombosis or baker's cysts bilaterally.  TTE 04/2016 - Left ventricle: The cavity size was normal. There was mild focal   basal hypertrophy of the septum. Systolic function was normal.   The estimated ejection fraction was in the range of 60% to 65%.   Wall motion was normal; there were no regional wall motion   abnormalities. Doppler parameters are consistent with abnormal   left ventricular relaxation (grade 1 diastolic dysfunction). - Aortic valve: There was mild regurgitation. Impressions: - No cardiac source of emboli was indentified.  CUS pending   PHYSICAL EXAM  Temp:  [97.5 F (36.4 C)-99 F (37.2 C)] 99 F (37.2 C) (06/20 0049) Pulse Rate:  [59-75] 73 (06/20 0049) Resp:  [15-18] 15 (06/20 0049) BP: (119-158)/(59-74) 140/74 (06/20 0049) SpO2:  [95 %-99 %] 99 % (06/20 0049)  General - Well nourished, well developed, in no apparent distress.  Ophthalmologic - Sharp disc margins OU.  Cardiovascular - Regular rate and rhythm.  Mental Status -  Level of arousal and orientation to time, place, and person were intact. Language exam showed hesitation of speech, partial expressive aphasia, able to follow commands, but mild anomia with naming 2/4, able to repeat  Fund of Knowledge was assessed and was intact.  Cranial Nerves II - XII - II - Visual field intact OU. III, IV, VI - Extraocular movements intact. V - Facial sensation intact bilaterally. VII - Facial movement intact bilaterally. VIII - Hearing & vestibular intact bilaterally. X - Palate elevates symmetrically. XI - Chin turning & shoulder shrug intact bilaterally. XII - Tongue protrusion  intact.  Motor Strength - The patient's strength was normal in all extremities and pronator drift was absent.  Bulk was normal and fasciculations were absent.   Motor Tone - Muscle tone was assessed at the neck and appendages and was normal.  Reflexes - The patient's reflexes were 1+ in all extremities and he had no pathological reflexes.  Sensory - Light touch, temperature/pinprick were assessed and were symmetrical.    Coordination - The patient had normal movements in the hands with no ataxia or dysmetria.  Tremor was absent.  Gait and Station - deferred.   ASSESSMENT/PLAN Mr. STACIE KNUTZEN is a 75 y.o. male with history of two prior strokes, CAD, BPH s/p TURP, CKD, COPD and multiple sclerosis presenting with aphasia and left facial droop. He did not receive IV t-PA due to arrive outside of the treatment window.   Recurrent embolic stroke: left MCA acute/early subacute infarct at left lateral parietal lobe and left superior parietal lobe in the setting of two prior strokes, likely  embolic, source unknown   Resultant  Mild expressive aphasia and anomia  CT head: No acute abnormalities  MRI head: Small acute/early subacute infarct within the left lateral parietal lobe and subcentimeter focus in left superior parietal lobe. Several chronic lacunar infarcts in basal ganglia and cerebellum.  MRA head unremarkable  Carotid Doppler pending  2D Echo 04/2016 - EF 60-65%  Loop interrogation showed no afib  Pan CT no evidence of cancer metastatsis  LDL 36  HgbA1c 5.6  Lovenox 40 mg sq daily for VTE prophylaxis  Diet Heart Room service appropriate? Yes; Fluid consistency: Thin  Aggrenox prior to admission, now on Aggrenox. Discussed with pt and family, due to recurrent cardioembolic infarcts with unclear source, will initiate anticoagulation once work up complete. Prefer eliquis 5mg  bid.   Patient counseled to be compliant with his antithrombotic medications  Ongoing  aggressive stroke risk factor management  Therapy recommendations: pending  Disposition:  Pending  Hx of stroke  06/15/2015 for right MCA infarcts at right insular and posterior frontal lobe. CT head, carotid Doppler, TTE were all negative. LDL 91 and A1c 6.4. His aspirin 81 changed to 325, resumed Lipitor 20  06/20/2015 recrudescence of previous stroke in the setting of infection and presyncope. DVT and TEE negative without PFO. Loop recorder placed. Aspirin was changed to Plavix on discharge.   04/04/16 small right cerebellar and right occipital lobe with left ACA punctate infarct s/p tPA, again embolic pattern. CTA head and neck only showed left VA stenosis. EF 55-60%. TCD bubble study negative for PFO, and loop recorder showed no afib. LDL 61 and A1C 5.6. His plavix changed to aggrenox on discharge. CTA chest showed fusiform dilatation of ascending aorta and mild to moderate thoracic descending aorta atherosclerosis, which can not explain the stroke.  Urethral cancer with recurrent UTI  follows with [place] [facility]  no recurrence or metastasis found.   Pan CT no evidence of mets this time  UA WBC TNTC  Hypertension  Stable  Permissive hypertension (OK if < 220/120) but gradually normalize in 5-7 days  Long-term BP goal normotensive  Hyperlipidemia  Home meds: atorvastatin 40mg  PO daily resumed in hospital  LDL 36, goal < 70  Continue statin at discharge  Other Stroke Risk Factors  Advanced age  Former cigarette smoker, 80 pack-years, quit 1995  ETOH use, advised to drink no more than 2 drink(s) a day  Obesity, Body mass index is 30.94 kg/m., recommend weight loss, diet and exercise as appropriate   Family hx stroke (mother, father)  Coronary artery disease  Diastolic heart failure  Other Active Problems  CKD  COPD  Lung nodule - decreased in size  Multiple sclerosis - not active   Hospital day # 0  Rosalin Hawking, MD PhD Stroke  Neurology 06/23/2016 4:58 AM  To contact Stroke Continuity provider, please refer to [email or web address] After hours, contact General Neurology

## 2016-06-23 ENCOUNTER — Inpatient Hospital Stay (HOSPITAL_COMMUNITY): Payer: Medicare Other

## 2016-06-23 ENCOUNTER — Encounter (HOSPITAL_COMMUNITY): Payer: Medicare Other

## 2016-06-23 DIAGNOSIS — C680 Malignant neoplasm of urethra: Secondary | ICD-10-CM

## 2016-06-23 LAB — BASIC METABOLIC PANEL
ANION GAP: 9 (ref 5–15)
BUN: 14 mg/dL (ref 6–20)
CHLORIDE: 103 mmol/L (ref 101–111)
CO2: 22 mmol/L (ref 22–32)
Calcium: 8.9 mg/dL (ref 8.9–10.3)
Creatinine, Ser: 1.08 mg/dL (ref 0.61–1.24)
GFR calc Af Amer: >60 mL/min (ref >60)
GFR calc non Af Amer: >60 mL/min (ref >60)
GLUCOSE: 96 mg/dL (ref 65–99)
POTASSIUM: 4 mmol/L (ref 3.5–5.1)
Sodium: 134 mmol/L — ABNORMAL LOW (ref 135–145)

## 2016-06-23 LAB — CBC
HEMATOCRIT: 41.6 % (ref 39.0–52.0)
Hemoglobin: 13.5 g/dL (ref 13.0–17.0)
MCH: 31.5 pg (ref 26.0–34.0)
MCHC: 32.5 g/dL (ref 30.0–36.0)
MCV: 97.2 fL (ref 78.0–100.0)
Platelets: 262 10*3/uL (ref 150–400)
RBC: 4.28 MIL/uL (ref 4.22–5.81)
RDW: 13.8 % (ref 11.5–15.5)
WBC: 12.8 10*3/uL — ABNORMAL HIGH (ref 4.0–10.5)

## 2016-06-23 MED ORDER — LEVOFLOXACIN 500 MG PO TABS: 500.0000 mg | ORAL_TABLET | Freq: Every day | ORAL | 0 refills | Status: DC

## 2016-06-23 MED ORDER — DEXTROSE 5 % IV SOLN
1.0000 g | INTRAVENOUS | Status: DC
  Filled 2016-06-23: qty 10

## 2016-06-23 MED ORDER — APIXABAN 5 MG PO TABS: 5.0000 mg | ORAL_TABLET | Freq: Two times a day (BID) | ORAL | 3 refills | Status: DC

## 2016-06-23 MED ORDER — APIXABAN 5 MG PO TABS: 5.0000 mg | ORAL_TABLET | Freq: Two times a day (BID) | ORAL | Status: DC

## 2016-06-23 NOTE — Discharge Summary (Signed)
Physician Discharge Summary   Patient ID: SCOTTI KOSTA MRN: [id number] DOB/AGE: [birth year] 76 y.o.  Admit date: 06/21/2016 Discharge date: 06/23/2016  Primary Care Physician:  [name], MD  Discharge Diagnoses:    Acute CVA  Escherichia coli UTI . Possible Multiple sclerosis ([place]) . Depression . Urinary retention with incomplete bladder emptying . Chronic diastolic CHF (congestive heart failure) ([place]) . Coronary artery disease involving native coronary artery of native heart without angina pectoris . COPD (chronic obstructive pulmonary disease) ([place]) . Essential hypertension . Gastroesophageal reflux disease without esophagitis    Consults: Neurology  Recommendations for Outpatient Follow-up:  1. Aggrenox discontinued, started on Eliquis 5mg  BID  2. Please repeat CBC/BMET at next visit 3. Please follow urine cultures till final, currently showing Escherichia coli however sensitivities are still pending. Patient did not want to stay in the hospital any longer. Hence based on the previous urine cultures I chose Levaquin and discussed with his wife in detail. She will call Dr. Samella Parr office tomorrow and follow up on the urine culture.   DIET: Heart healthy diet    Allergies:   Allergies  Allergen Reactions  . Acetylcholine     Patient was suspected of having "Brugada Syndrome": (BrS) is a genetic condition that results in abnormal electrical activity within the heart, increasing the risk of sudden cardiac death. Those affected may have episodes of passing out.  Marland Kitchen Alcohol-Sulfur [Sulfur] Other (See Comments)    Patient was suspected of having "Brugada Syndrome"  . Amitriptyline Other (See Comments)    Patient was suspected of having "Brugada Syndrome"  . Bupivacaine Other (See Comments)    Patient was suspected of having "Brugada Syndrome"  . Clomipramine Hcl Other (See Comments)    Patient was suspected of having "Brugada Syndrome"  . Cocaine Other (See  Comments)    Patient was suspected of having "Brugada Syndrome"  . Desipramine Other (See Comments)    Patient was suspected of having "Brugada Syndrome"  . Ergonovine Other (See Comments)    Patient was suspected of having "Brugada Syndrome"  . Flecainide Other (See Comments)    Patient was suspected of having "Brugada Syndrome"  . Lithium Other (See Comments)    Patient was suspected of having "Brugada Syndrome"  . Loxapine Other (See Comments)    Patient was suspected of having "Brugada Syndrome"  . Nortriptyline Other (See Comments)    Patient was suspected of having "Brugada Syndrome"  . Oxcarbazepine Other (See Comments)    Patient was suspected of having "Brugada Syndrome"  . Procainamide Other (See Comments)    Patient was suspected of having "Brugada Syndrome"  . Procaine Other (See Comments)    Patient was suspected of having "Brugada Syndrome"  . Propafenone Other (See Comments)    Patient was suspected of having "Brugada Syndrome"  . Propofol Other (See Comments)    Patient was suspected of having "Brugada Syndrome"  . Trifluoperazine Other (See Comments)    Patient was suspected of having "Brugada Syndrome"     DISCHARGE MEDICATIONS: Current Discharge Medication List    START taking these medications   Details  apixaban (ELIQUIS) 5 MG TABS tablet Take 1 tablet (5 mg total) by mouth 2 (two) times daily. Qty: 60 tablet, Refills: 3    levofloxacin (LEVAQUIN) 500 MG tablet Take 1 tablet (500 mg total) by mouth daily. X 7 Qty: 7 tablet, Refills: 0      CONTINUE these medications which have NOT CHANGED   Details  acetaminophen (  TYLENOL) 325 MG tablet Take 650 mg by mouth every 6 (six) hours as needed (for pain or headaches).     amantadine (SYMMETREL) 100 MG capsule TAKE 1 CAPSULE BY MOUTH TWICE DAILY WITH BREAKFAST AND LUNCH Qty: 180 capsule, Refills: 2    atorvastatin (LIPITOR) 40 MG tablet Take 1 tablet (40 mg total) by mouth daily. Qty: 90 tablet, Refills:  3    bisoprolol (ZEBETA) 10 MG tablet Take 1 tablet (10 mg total) by mouth daily. Qty: 90 tablet, Refills: 3    cholecalciferol (VITAMIN D) 400 units TABS tablet Take 400 Units by mouth daily.     Cyanocobalamin (B-12) 1000 MCG CAPS Take 1,000 mcg by mouth daily.     doxazosin (CARDURA) 2 MG tablet Take 1 tablet (2 mg total) by mouth daily. Qty: 30 tablet, Refills: 2    folic acid (FOLVITE) 1 MG tablet Take 1 mg by mouth daily.    furosemide (LASIX) 40 MG tablet Take 40 mg by mouth daily.    HYDROcodone-acetaminophen (NORCO) 10-325 MG tablet Take 1 tablet by mouth every 6 (six) hours as needed for moderate pain or severe pain. Qty: 100 tablet, Refills: 0    losartan (COZAAR) 50 MG tablet Take 1 tablet (50 mg total) by mouth daily. Qty: 90 tablet, Refills: 4    MAGNESIUM CITRATE PO Take 0.5-1 Bottles by mouth once as needed (for mild constipation).     Multiple Vitamins-Minerals (DRY EYE FORMULA) CAPS Place 1-2 drops into both eyes 4 (four) times daily as needed (for dry eyes).    pantoprazole (PROTONIX) 40 MG tablet Take 1 tablet (40 mg total) by mouth daily. Qty: 90 tablet, Refills: 3    PROAIR HFA 108 (90 Base) MCG/ACT inhaler USE 1 TO 2 INHALATIONS EVERY 6 HOURS AS NEEDED FOR WHEEZING OR SHORTNESS OF BREATH Qty: 25.5 g, Refills: 4    rOPINIRole (REQUIP) 1 MG tablet TAKE 1 TABLET AT BEDTIME Qty: 90 tablet, Refills: 3    SPIRIVA HANDIHALER 18 MCG inhalation capsule INHALE THE CONTENTS OF 1 CAPSULE WITH 2         INHALATIONS ONCE DAILY AS DIRECTED Qty: 1 capsule, Refills: 11    venlafaxine XR (EFFEXOR-XR) 150 MG 24 hr capsule TAKE 1 CAPSULE TWICE A DAY Qty: 180 capsule, Refills: 3    OXYGEN Inhale 2 L into the lungs at bedtime.      STOP taking these medications     dipyridamole-aspirin (AGGRENOX) 200-25 MG 12hr capsule      amLODipine (NORVASC) 10 MG tablet          Brief H and P: For complete details please refer to admission H and P, but in brief 76 year old  male with history of 2 prior strokes, coronary artery disease, hypertension, hyperlipidemia, chronic kidney disease stage II, COPD, bladder cancer and BPH status post TURP; who presented to the hospital secondary to expressive aphasia and worsening left facial droop. Found to have small acute/early subacute infarct within the left lateral parietal lobe.Patient also has UTI, urine culture showing more than 100,000 colonies of gram-negative rods  Hospital Course:  Acute CVA - Recurrent embolic CVA. Patient presented with aphasia and left facial droop, did not received IV TPA due to late presentation - MRI showed small acute/early subacute infarct within the left lateral parietal lobe and subcentimeter focus in the left superior parietal lobe. Several chronic lacunar infarcts in the basal ganglia and cerebellum - MRA unremarkable - 2-D echo was recently done in April 2018 which  had shown EF of 60-65%, hence not repeated again - LDL 36, A1c 5.6 - Loop interrogation showed atrial fibrillation. - Due to recurrent cardioembolic infarct, initiated eliquis 5 mg twice a day by neurology   Escherichia coli UTI, recurrent UTI, history of urethral cancer - UA positive for UTI, urine culture showed more than 100,000 colonies of Escherichia coli - Urine culture and sensitivities are still pending, patient was placed on IV Rocephin. However he does not want to stay in the hospital any longer. Based on his previous urine culture results, I chose Levaquin for 7 days. I discussed with the patient's wife in detail, she will call Dr. Franne Grip office tomorrow and follow up on the urine culture and sensitivities in case the antibiotic will need to be changed. - CT scans with no evidence of metastasis at this time. Follows with [facility]. - Patient and his wife requested inpatient renal ultrasound which was scheduled outpatient on 6/21. Renal ultrasound showed no acute renal abnormality, no  hydronephrosis. Bladder wall thickening and irregularity may be related to cystitis or bladder outlet obstruction.   Coronary artery disease - Continue statin, started on eliquis   Hypertension - BP currently stable  Chronic diastolic CHF Currently compensated, continue Lasix   Chronic kidney disease stage II - Creatinine currently stable, monitor closely  GERD - Continue PPI  Depression -Continue Effexor   Hyperlipidemia - Continue Lipitor, LDL 36   Day of Discharge BP 138/67 (BP Location: Right Arm)   Pulse 71   Temp 98.1 F (36.7 C) (Oral)   Resp 16   Ht 5\' 10"  (1.778 m)   Wt 97.8 kg (215 lb 9.8 oz)   SpO2 99%   BMI 30.94 kg/m   Physical Exam: General: Alert and awake oriented x3 not in any acute distress. HEENT: anicteric sclera, pupils reactive to light and accommodation CVS: S1-S2 clear no murmur rubs or gallops Chest: clear to auscultation bilaterally, no wheezing rales or rhonchi Abdomen: soft nontender, nondistended, normal bowel sounds Extremities: no cyanosis, clubbing or edema noted bilaterally Neuro: Cranial nerves II-XII intact, no focal neurological deficits   The results of significant diagnostics from this hospitalization (including imaging, microbiology, ancillary and laboratory) are listed below for reference.    LAB RESULTS: Basic Metabolic Panel:  Recent Labs Lab 06/21/16 1118 06/21/16 1144 06/23/16 0631  NA 135 136 134*  K 4.6 4.7 4.0  CL 101 99* 103  CO2 26  --  22  GLUCOSE 97 93 96  BUN 13 14 14   CREATININE 1.17 1.10 1.08  CALCIUM 9.0  --  8.9   Liver Function Tests:  Recent Labs Lab 06/21/16 1118  AST 22  ALT 16*  ALKPHOS 76  BILITOT 0.6  PROT 7.1  ALBUMIN 4.0   No results for input(s): LIPASE, AMYLASE in the last 168 hours. No results for input(s): AMMONIA in the last 168 hours. CBC:  Recent Labs Lab 06/21/16 1118 06/21/16 1144 06/23/16 0631  WBC 7.8  --  12.8*  NEUTROABS 5.1  --   --   HGB  13.9 14.3 13.5  HCT 42.3 42.0 41.6  MCV 98.4  --  97.2  PLT 312  --  262   Cardiac Enzymes: No results for input(s): CKTOTAL, CKMB, CKMBINDEX, TROPONINI in the last 168 hours. BNP: Invalid input(s): POCBNP CBG: No results for input(s): GLUCAP in the last 168 hours.  Significant Diagnostic Studies:  Ct Head Wo Contrast  Result Date: 06/21/2016 CLINICAL DATA:  Left-sided facial  droop and altered mental status EXAM: CT HEAD WITHOUT CONTRAST TECHNIQUE: Contiguous axial images were obtained from the base of the skull through the vertex without intravenous contrast. COMPARISON:  04/05/2016 FINDINGS: Brain: Diffuse chronic white matter ischemic changes are again identified and similar to that seen on prior MRI examination. Lacunar infarct is noted on the right in the frontal lobe stable from the prior study. Recently seen cerebellar infarcts are not as well appreciated on this exam as on the recent MRI. Vascular: No hyperdense vessel or unexpected calcification. Skull: Normal. Negative for fracture or focal lesion. Sinuses/Orbits: No acute finding. Other: None. IMPRESSION: Chronic ischemic changes and mild atrophic changes similar to that seen on previous exam. No acute abnormality is identified. Electronically Signed   By: Inez Catalina M.D.   On: 06/21/2016 12:28   Ct Chest W Contrast  Result Date: 06/22/2016 CLINICAL DATA:  Low-grade urothelial carcinoma. EXAM: CT CHEST, ABDOMEN, AND PELVIS WITH CONTRAST TECHNIQUE: Multidetector CT imaging of the chest, abdomen and pelvis was performed following the standard protocol during bolus administration of intravenous contrast. CONTRAST:  157mL ISOVUE-300 IOPAMIDOL (ISOVUE-300) INJECTION 61% COMPARISON:  CT chest from 04/28/2016 and CT chest from 06/05/2015. PET-CT from 12/11/2013. FINDINGS: CT CHEST FINDINGS Cardiovascular: The heart size appears within normal limits. No pericardial effusion. Aortic atherosclerosis noted. Aortic atherosclerosis. Stable  fusiform dilatation of the ascending thoracic aorta measuring 4 cm, image 32 of series 3. Mediastinum/Nodes: The trachea appears patent and is midline. Normal appearance of the esophagus. Sub- carinal lymph node measures 11 mm, image 33 of series 3. Previously 7 mm. No hilar adenopathy. No axillary or supraclavicular adenopathy. Lungs/Pleura: No pleural effusion identified. Right middle lobe perifissural nodule is unchanged measuring 6 mm, image 36 of series 4. Nodular density within the right middle lobe with surrounding architectural distortion measures 1.2 x 2.1 cm, image 42 of series 5. Unchanged from 06/05/2015. Adjacent nodular density measures 1.9 by 1.1 cm, image 44 of series 5. Previously 2.9 x 1.8 cm. Stable lingular scarring. No change in subpleural nodule within the left lower lobe measuring 4 mm, image 44 of series 5. Musculoskeletal: Degenerative changes noted in the right glenohumeral joint. No aggressive lytic or sclerotic bone lesions identified. CT ABDOMEN PELVIS FINDINGS Hepatobiliary: No focal liver abnormality is seen. No gallstones, gallbladder wall thickening, or biliary dilatation. Pancreas: Unremarkable. No pancreatic ductal dilatation or surrounding inflammatory changes. Spleen: Normal in size without focal abnormality. Adrenals/Urinary Tract: The adrenal glands appear normal. No mass or hydronephrosis. Exophytic structure arising from the anterolateral cortex of the right mid kidney measures 9 mm and is too small to characterize. No hydronephrosis identified. No hydroureter. The urinary bladder appears unremarkable. Stomach/Bowel: The stomach is normal. The small bowel loops have a normal course and caliber. Normal appearance of the colon. Vascular/Lymphatic: Aortic atherosclerosis. No upper abdominal adenopathy. No pelvic or inguinal adenopathy. Reproductive: Prostate is unremarkable. Other: No abdominal wall hernia or abnormality. No abdominopelvic ascites. Musculoskeletal: No suspicious  bone lesions. Degenerative disc disease identified. L2-3 vertebral fusion may be postsurgical or congenital. IMPRESSION: 1. Solid nodule within the perifissural right middle lobe is stable from previous exam. The nodular densities within the right middle lobe have decreased in size when compared with 06/05/15 favoring a benign process. 2. Borderline enlarged sub- carinal lymph node appears mildly increased in size from previous exam. 11 mm on today's study versus 7 mm previously. 3. No mass or adenopathy identified within the abdomen or pelvis. 4.  Aortic Atherosclerosis (ICD10-I70.0). Electronically Signed  By: Kerby Moors M.D.   On: 06/22/2016 12:46   Mr Brain Wo Contrast  Result Date: 06/21/2016 CLINICAL DATA:  76 y/o  M; expressive aphasia. EXAM: MRI HEAD WITHOUT CONTRAST TECHNIQUE: Multiplanar, multiecho pulse sequences of the brain and surrounding structures were obtained without intravenous contrast. COMPARISON:  06/21/2016 CT of the head. FINDINGS: Brain: Small region of left parietal cortical three views diffusion and additional subcentimeter focus in the left superior parietal lobe compatible with acute/ early subacute infarction. Areas of reduced diffusion are associated with mild T2 FLAIR hyperintense signal abnormality. There is no significant mass effect or associated hemorrhage. Background of moderate chronic microvascular ischemic changes and parenchymal volume loss of the brain. Small chronic infarction and right frontal periventricular white matter. Small chronic lacunar infarcts are present in bilateral cerebellar hemispheres, bilateral thalamus, lentiform nuclei, and the right caudate body. No extra-axial collection, hydrocephalus, or herniation. Vascular: Normal flow voids. Skull and upper cervical spine: Normal marrow signal. Sinuses/Orbits: Negative. Other: None. IMPRESSION: 1. Small acute/early subacute infarct within the left lateral parietal lobe and subcentimeter focus in left  superior parietal lobe. No hemorrhage or significant associated mass effect. 2. Background of moderate chronic microvascular ischemic changes and parenchymal volume loss of the brain. Several chronic lacunar infarcts in basal ganglia and cerebellum. These results will be called to the ordering clinician or representative by the Radiologist Assistant, and communication documented in the PACS or zVision Dashboard. Electronically Signed   By: Kristine Garbe M.D.   On: 06/21/2016 19:28   Ct Abdomen Pelvis W Contrast  Result Date: 06/22/2016 CLINICAL DATA:  Low-grade urothelial carcinoma. EXAM: CT CHEST, ABDOMEN, AND PELVIS WITH CONTRAST TECHNIQUE: Multidetector CT imaging of the chest, abdomen and pelvis was performed following the standard protocol during bolus administration of intravenous contrast. CONTRAST:  121mL ISOVUE-300 IOPAMIDOL (ISOVUE-300) INJECTION 61% COMPARISON:  CT chest from 04/28/2016 and CT chest from 06/05/2015. PET-CT from 12/11/2013. FINDINGS: CT CHEST FINDINGS Cardiovascular: The heart size appears within normal limits. No pericardial effusion. Aortic atherosclerosis noted. Aortic atherosclerosis. Stable fusiform dilatation of the ascending thoracic aorta measuring 4 cm, image 32 of series 3. Mediastinum/Nodes: The trachea appears patent and is midline. Normal appearance of the esophagus. Sub- carinal lymph node measures 11 mm, image 33 of series 3. Previously 7 mm. No hilar adenopathy. No axillary or supraclavicular adenopathy. Lungs/Pleura: No pleural effusion identified. Right middle lobe perifissural nodule is unchanged measuring 6 mm, image 36 of series 4. Nodular density within the right middle lobe with surrounding architectural distortion measures 1.2 x 2.1 cm, image 42 of series 5. Unchanged from 06/05/2015. Adjacent nodular density measures 1.9 by 1.1 cm, image 44 of series 5. Previously 2.9 x 1.8 cm. Stable lingular scarring. No change in subpleural nodule within the left  lower lobe measuring 4 mm, image 44 of series 5. Musculoskeletal: Degenerative changes noted in the right glenohumeral joint. No aggressive lytic or sclerotic bone lesions identified. CT ABDOMEN PELVIS FINDINGS Hepatobiliary: No focal liver abnormality is seen. No gallstones, gallbladder wall thickening, or biliary dilatation. Pancreas: Unremarkable. No pancreatic ductal dilatation or surrounding inflammatory changes. Spleen: Normal in size without focal abnormality. Adrenals/Urinary Tract: The adrenal glands appear normal. No mass or hydronephrosis. Exophytic structure arising from the anterolateral cortex of the right mid kidney measures 9 mm and is too small to characterize. No hydronephrosis identified. No hydroureter. The urinary bladder appears unremarkable. Stomach/Bowel: The stomach is normal. The small bowel loops have a normal course and caliber. Normal appearance of the colon.  Vascular/Lymphatic: Aortic atherosclerosis. No upper abdominal adenopathy. No pelvic or inguinal adenopathy. Reproductive: Prostate is unremarkable. Other: No abdominal wall hernia or abnormality. No abdominopelvic ascites. Musculoskeletal: No suspicious bone lesions. Degenerative disc disease identified. L2-3 vertebral fusion may be postsurgical or congenital. IMPRESSION: 1. Solid nodule within the perifissural right middle lobe is stable from previous exam. The nodular densities within the right middle lobe have decreased in size when compared with 06/05/15 favoring a benign process. 2. Borderline enlarged sub- carinal lymph node appears mildly increased in size from previous exam. 11 mm on today's study versus 7 mm previously. 3. No mass or adenopathy identified within the abdomen or pelvis. 4.  Aortic Atherosclerosis (ICD10-I70.0). Electronically Signed   By: Kerby Moors M.D.   On: 06/22/2016 12:46   Mr Jodene Nam Head/brain YT Cm  Result Date: 06/22/2016 CLINICAL DATA:  Initial evaluation for slurred speech, altered mental  status. EXAM: MRA HEAD WITHOUT CONTRAST TECHNIQUE: Angiographic images of the Circle of Willis were obtained using MRA technique without intravenous contrast. COMPARISON:  Prior MRI from earlier same day. FINDINGS: ANTERIOR CIRCULATION: Study degraded by motion artifact. Distal cervical segments of the internal carotid arteries are patent with antegrade flow. Petrous, cavernous, and supraclinoid segments patent without flow-limiting stenosis. ICA termini widely patent. A1 segments patent bilaterally. Anterior communicating artery normal. Anterior cerebral arteries patent to their distal aspects. M1 segments patent without stenosis or occlusion. No proximal M2 occlusion. Distal MCA branches well opacified and symmetric. POSTERIOR CIRCULATION: Vertebral arteries patent to the vertebrobasilar junction without stenosis. Posterior inferior cerebral arteries patent proximally. Basilar artery widely patent to its distal aspect. Superior cerebellar is patent bilaterally. Both of the posterior cerebral artery supplied via the basilar and are widely patent to their distal aspects. No aneurysm or vascular malformation. IMPRESSION: Negative intracranial MRA. No large or proximal arterial branch occlusion. No high-grade or correctable stenosis. Electronically Signed   By: Jeannine Boga M.D.   On: 06/22/2016 00:21    2D ECHO:   Disposition and Follow-up: Discharge Instructions    Ambulatory referral to Neurology    Complete by:  As directed    Pt will follow up with Dr. Erlinda Hong at [facility] in about 2 months. Thanks.   Ambulatory referral to Occupational Therapy    Complete by:  As directed    Ambulatory referral to Speech Therapy    Complete by:  As directed    Diet - low sodium heart healthy    Complete by:  As directed    Increase activity slowly    Complete by:  As directed        [place]    Rosalin Hawking, MD. Schedule an appointment as soon as possible  for a visit in 6 week(s).   Specialty:  Neurology Contact information: [address] [place] [id number] [facility].   Specialty:  Rehabilitation Why:  They will contact you for the first appointment. Contact information: [address] [place] [id number] [place]       [name], MD. Schedule an appointment as soon as possible for a visit in 1 week(s).   Specialty:  Family Medicine Why:  please call tomorrow later around noon to follow up on the urine cultures Contact information: [address] [place] [address] [phone]            Time spent on  Discharge: 21mins   Signed:   Estill Cotta M.D. [facility] 06/23/2016, [place] PM Pager: [phone]

## 2016-06-23 NOTE — Progress Notes (Signed)
Discharge orders received.  Discharge instructions and follow-up appointments reviewed with the patient and his wife.  VSS upon discharge.  IV removed and education complete.  Walked out to main entrance.  All prescriptions and belongings sent with the patient.   Cori Razor, RN

## 2016-06-23 NOTE — Telephone Encounter (Signed)
ONO has been received and will be given to MR to address. Will update chart once complete.

## 2016-06-23 NOTE — Care Management Note (Signed)
Case Management Note  Patient Details  Name: DELMORE SEAR MRN: [id number] Date of Birth: [birth year]  Subjective/Objective:                    Action/Plan: Pt discharging home with wife. CM consulted for outpatient therapy. Pt would like to go to [facility]. Orders in EPIC and information on the AVS.  CM also consulted for Eliquis. Pt informed of copay of $24 until sent to his mail order and then the cost will go down. Pt is agreeable to the cost. Pt provided 30 day free card.  Wife to provide transportation home.   Expected Discharge Date:                  Expected Discharge Plan:  OP Rehab  In-House Referral:     Discharge planning Services  CM Consult  Post Acute Care Choice:    Choice offered to:     DME Arranged:    DME Agency:     HH Arranged:    [place] Agency:     Status of Service:  Completed, signed off  If discussed at [employer] of Stay Meetings, dates discussed:    Additional Comments:  DANIAL HLAVAC, RN 06/23/2016, 2:45 PM

## 2016-06-23 NOTE — Progress Notes (Signed)
[facility]                                                                              Patient Demographics  [name] [name], is a 76 y.o. male, DOB - 03-Aug-1940, ELF:[id number]  Admit date - 06/21/2016   Admitting Physician Vianne Bulls, MD  Outpatient Primary MD for the patient is Pickard, Cammie Mcgee, MD  Outpatient specialists:   LOS - 1  days   Medical records reviewed and are as summarized below:    Chief Complaint  Patient presents with  . Altered Mental Status  . Aphasia       Brief summary   76 year old male with history of 2 prior strokes, coronary artery disease, hypertension, hyperlipidemia, chronic kidney disease stage II, COPD, bladder cancer and BPH status post TURP; who presented to the hospital secondary to expressive aphasia and worsening left facial droop. Found to have small acute/early subacute infarct within the left lateral parietal lobe.Patient also has UTI, urine culture showing more than 100,000 colonies of gram-negative rods   Assessment & Plan    Principal Problem: Acute CVA - Recurrent embolic CVA. Patient presented with aphasia and left facial droop, did not received IV TPA due to late presentation - MRI showed small acute/early subacute infarct within the left lateral parietal lobe and subcentimeter focus in the left superior parietal lobe. Several chronic lacunar infarcts in the basal ganglia and cerebellum - MRA unremarkable - 2-D echo was recently done in April 2018 which had shown EF of 60-65%, hence not repeated again - LDL 36, A1c 5.6 - Loop interrogation showed atrial fibrillation. - Due to recurrent cardioembolic infarct, initiated eliquis 5 mg twice a day by neurology  Active Problems: UTI gram-negative rods, recurrent UTI, history of urethral cancer - UA positive for UTI, urine culture showed more than 100,000 colonies of gram-negative rods - Follow urine culture and sensitivities, placed on IV Rocephin - CT  scans with no evidence of metastasis at this time. Follows with [facility]. - Patient and wife requesting if renal ultrasound can be done inpatient which is scheduled for tomorrow as outpatient   Coronary artery disease - Continue statin, started on eliquis   Hypertension - BP currently stable  Chronic diastolic CHF Currently compensated, continue Lasix - Strict I's and O's and daily weights  Chronic kidney disease stage II - Creatinine currently stable, monitor closely  GERD - Continue PPI  Depression -Continue Effexor   Hyperlipidemia - Continue Lipitor, LDL 36  Code Status: Full code  DVT Prophylaxis:  eliquis  Family Communication: Discussed in detail with the patient, all imaging results, lab results explained to the patient   Disposition Plan: dc in am   Time Spent in minutes   25 minutes  Procedures:    Consultants:   Neurology  Antimicrobials:   Rocephin 6/20   Medications  Scheduled Meds: . amantadine  100 mg Oral BID  . apixaban  5 mg Oral BID  . atorvastatin  40 mg Oral q1800  . cholecalciferol  400 Units Oral Daily  . dipyridamole-aspirin  1 capsule Oral BID  . doxazosin  2  mg Oral Daily  . folic acid  1 mg Oral Daily  . furosemide  40 mg Oral Daily  . pantoprazole  40 mg Oral Daily  . rOPINIRole  1 mg Oral QHS  . tiotropium  18 mcg Inhalation Daily  . venlafaxine XR  150 mg Oral BID  . vitamin B-12  1,000 mcg Oral Daily   Continuous Infusions: . cefTRIAXone (ROCEPHIN)  IV     PRN Meds:.acetaminophen **OR** acetaminophen (TYLENOL) oral liquid 160 mg/5 mL **OR** acetaminophen, albuterol, HYDROcodone-acetaminophen, magnesium citrate   Antibiotics   Anti-infectives    Start     Dose/Rate Route Frequency Ordered Stop   06/23/16 1600  cefTRIAXone (ROCEPHIN) 1 g in dextrose 5 % 50 mL IVPB     1 g 100 mL/hr over 30 Minutes Intravenous Every 24 hours 06/23/16 1523          Subjective:   Hisashi [name] was seen and  examined today.  Patient denies dizziness, chest pain, shortness of breath, abdominal pain, N/V/D/C, new weakness, numbess, tingling. No acute events overnight.    Objective:   Vitals:   06/23/16 0538 06/23/16 0900 06/23/16 0950 06/23/16 1012  BP: 135/62 132/68  124/64  Pulse: 75 71  69  Resp: 16 18  16   Temp: 98.2 F (36.8 C) 97.9 F (36.6 C)  97.9 F (36.6 C)  TempSrc: Oral Oral  Oral  SpO2: 98% 100% 99% 99%  Weight:      Height:       No intake or output data in the 24 hours ending 06/23/16 1525   Wt Readings from Last 3 Encounters:  06/21/16 97.8 kg (215 lb 9.8 oz)  06/18/16 100.7 kg (222 lb)  05/17/16 99.2 kg (218 lb 12.8 oz)     Exam  General: Alert and oriented x 3, NAD, slurred speech   Eyes: PERRLA, EOMI, Anicteric Sclera,  HEENT:  Atraumatic, normocephalic, normal oropharynx  Cardiovascular: S1 S2 auscultated, no rubs, murmurs or gallops. Regular rate and rhythm.  Respiratory: Clear to auscultation bilaterally, no wheezing, rales or rhonchi  Gastrointestinal: Soft, nontender, nondistended, + bowel sounds  Ext: no pedal edema bilaterally  Neuro: AAOx3, Cr N's II- XII. Strength 5/5 upper and lower extremities bilaterally, speech clear, sensations grossly intact  Musculoskeletal: No digital cyanosis, clubbing  Skin: No rashes  Psych: Normal affect and demeanor, alert and oriented x3    Data Reviewed:  I have personally reviewed following labs and imaging studies  Micro Results Recent Results (from the past 240 hour(s))  Culture, Urine     Status: Abnormal (Preliminary result)   Collection Time: 06/21/16  7:45 PM  Result Value Ref Range Status   Specimen Description URINE, RANDOM  Final   Special Requests NONE  Final   Culture >=100,000 COLONIES/mL GRAM NEGATIVE RODS (A)  Final   Report Status PENDING  Incomplete    Radiology Reports Ct Head Wo Contrast  Result Date: 06/21/2016 CLINICAL DATA:  Left-sided facial droop and altered mental status  EXAM: CT HEAD WITHOUT CONTRAST TECHNIQUE: Contiguous axial images were obtained from the base of the skull through the vertex without intravenous contrast. COMPARISON:  04/05/2016 FINDINGS: Brain: Diffuse chronic white matter ischemic changes are again identified and similar to that seen on prior MRI examination. Lacunar infarct is noted on the right in the frontal lobe stable from the prior study. Recently seen cerebellar infarcts are not as well appreciated on this exam as on the recent MRI. Vascular: No hyperdense vessel or unexpected  calcification. Skull: Normal. Negative for fracture or focal lesion. Sinuses/Orbits: No acute finding. Other: None. IMPRESSION: Chronic ischemic changes and mild atrophic changes similar to that seen on previous exam. No acute abnormality is identified. Electronically Signed   By: Inez Catalina M.D.   On: 06/21/2016 12:28   Ct Chest W Contrast  Result Date: 06/22/2016 CLINICAL DATA:  Low-grade urothelial carcinoma. EXAM: CT CHEST, ABDOMEN, AND PELVIS WITH CONTRAST TECHNIQUE: Multidetector CT imaging of the chest, abdomen and pelvis was performed following the standard protocol during bolus administration of intravenous contrast. CONTRAST:  178mL ISOVUE-300 IOPAMIDOL (ISOVUE-300) INJECTION 61% COMPARISON:  CT chest from 04/28/2016 and CT chest from 06/05/2015. PET-CT from 12/11/2013. FINDINGS: CT CHEST FINDINGS Cardiovascular: The heart size appears within normal limits. No pericardial effusion. Aortic atherosclerosis noted. Aortic atherosclerosis. Stable fusiform dilatation of the ascending thoracic aorta measuring 4 cm, image 32 of series 3. Mediastinum/Nodes: The trachea appears patent and is midline. Normal appearance of the esophagus. Sub- carinal lymph node measures 11 mm, image 33 of series 3. Previously 7 mm. No hilar adenopathy. No axillary or supraclavicular adenopathy. Lungs/Pleura: No pleural effusion identified. Right middle lobe perifissural nodule is unchanged  measuring 6 mm, image 36 of series 4. Nodular density within the right middle lobe with surrounding architectural distortion measures 1.2 x 2.1 cm, image 42 of series 5. Unchanged from 06/05/2015. Adjacent nodular density measures 1.9 by 1.1 cm, image 44 of series 5. Previously 2.9 x 1.8 cm. Stable lingular scarring. No change in subpleural nodule within the left lower lobe measuring 4 mm, image 44 of series 5. Musculoskeletal: Degenerative changes noted in the right glenohumeral joint. No aggressive lytic or sclerotic bone lesions identified. CT ABDOMEN PELVIS FINDINGS Hepatobiliary: No focal liver abnormality is seen. No gallstones, gallbladder wall thickening, or biliary dilatation. Pancreas: Unremarkable. No pancreatic ductal dilatation or surrounding inflammatory changes. Spleen: Normal in size without focal abnormality. Adrenals/Urinary Tract: The adrenal glands appear normal. No mass or hydronephrosis. Exophytic structure arising from the anterolateral cortex of the right mid kidney measures 9 mm and is too small to characterize. No hydronephrosis identified. No hydroureter. The urinary bladder appears unremarkable. Stomach/Bowel: The stomach is normal. The small bowel loops have a normal course and caliber. Normal appearance of the colon. Vascular/Lymphatic: Aortic atherosclerosis. No upper abdominal adenopathy. No pelvic or inguinal adenopathy. Reproductive: Prostate is unremarkable. Other: No abdominal wall hernia or abnormality. No abdominopelvic ascites. Musculoskeletal: No suspicious bone lesions. Degenerative disc disease identified. L2-3 vertebral fusion may be postsurgical or congenital. IMPRESSION: 1. Solid nodule within the perifissural right middle lobe is stable from previous exam. The nodular densities within the right middle lobe have decreased in size when compared with 06/05/15 favoring a benign process. 2. Borderline enlarged sub- carinal lymph node appears mildly increased in size from  previous exam. 11 mm on today's study versus 7 mm previously. 3. No mass or adenopathy identified within the abdomen or pelvis. 4.  Aortic Atherosclerosis (ICD10-I70.0). Electronically Signed   By: Kerby Moors M.D.   On: 06/22/2016 12:46   Mr Brain Wo Contrast  Result Date: 06/21/2016 CLINICAL DATA:  76 y/o  M; expressive aphasia. EXAM: MRI HEAD WITHOUT CONTRAST TECHNIQUE: Multiplanar, multiecho pulse sequences of the brain and surrounding structures were obtained without intravenous contrast. COMPARISON:  06/21/2016 CT of the head. FINDINGS: Brain: Small region of left parietal cortical three views diffusion and additional subcentimeter focus in the left superior parietal lobe compatible with acute/ early subacute infarction. Areas of reduced diffusion are  associated with mild T2 FLAIR hyperintense signal abnormality. There is no significant mass effect or associated hemorrhage. Background of moderate chronic microvascular ischemic changes and parenchymal volume loss of the brain. Small chronic infarction and right frontal periventricular white matter. Small chronic lacunar infarcts are present in bilateral cerebellar hemispheres, bilateral thalamus, lentiform nuclei, and the right caudate body. No extra-axial collection, hydrocephalus, or herniation. Vascular: Normal flow voids. Skull and upper cervical spine: Normal marrow signal. Sinuses/Orbits: Negative. Other: None. IMPRESSION: 1. Small acute/early subacute infarct within the left lateral parietal lobe and subcentimeter focus in left superior parietal lobe. No hemorrhage or significant associated mass effect. 2. Background of moderate chronic microvascular ischemic changes and parenchymal volume loss of the brain. Several chronic lacunar infarcts in basal ganglia and cerebellum. These results will be called to the ordering clinician or representative by the Radiologist Assistant, and communication documented in the PACS or zVision Dashboard.  Electronically Signed   By: Kristine Garbe M.D.   On: 06/21/2016 19:28   Ct Abdomen Pelvis W Contrast  Result Date: 06/22/2016 CLINICAL DATA:  Low-grade urothelial carcinoma. EXAM: CT CHEST, ABDOMEN, AND PELVIS WITH CONTRAST TECHNIQUE: Multidetector CT imaging of the chest, abdomen and pelvis was performed following the standard protocol during bolus administration of intravenous contrast. CONTRAST:  188mL ISOVUE-300 IOPAMIDOL (ISOVUE-300) INJECTION 61% COMPARISON:  CT chest from 04/28/2016 and CT chest from 06/05/2015. PET-CT from 12/11/2013. FINDINGS: CT CHEST FINDINGS Cardiovascular: The heart size appears within normal limits. No pericardial effusion. Aortic atherosclerosis noted. Aortic atherosclerosis. Stable fusiform dilatation of the ascending thoracic aorta measuring 4 cm, image 32 of series 3. Mediastinum/Nodes: The trachea appears patent and is midline. Normal appearance of the esophagus. Sub- carinal lymph node measures 11 mm, image 33 of series 3. Previously 7 mm. No hilar adenopathy. No axillary or supraclavicular adenopathy. Lungs/Pleura: No pleural effusion identified. Right middle lobe perifissural nodule is unchanged measuring 6 mm, image 36 of series 4. Nodular density within the right middle lobe with surrounding architectural distortion measures 1.2 x 2.1 cm, image 42 of series 5. Unchanged from 06/05/2015. Adjacent nodular density measures 1.9 by 1.1 cm, image 44 of series 5. Previously 2.9 x 1.8 cm. Stable lingular scarring. No change in subpleural nodule within the left lower lobe measuring 4 mm, image 44 of series 5. Musculoskeletal: Degenerative changes noted in the right glenohumeral joint. No aggressive lytic or sclerotic bone lesions identified. CT ABDOMEN PELVIS FINDINGS Hepatobiliary: No focal liver abnormality is seen. No gallstones, gallbladder wall thickening, or biliary dilatation. Pancreas: Unremarkable. No pancreatic ductal dilatation or surrounding inflammatory  changes. Spleen: Normal in size without focal abnormality. Adrenals/Urinary Tract: The adrenal glands appear normal. No mass or hydronephrosis. Exophytic structure arising from the anterolateral cortex of the right mid kidney measures 9 mm and is too small to characterize. No hydronephrosis identified. No hydroureter. The urinary bladder appears unremarkable. Stomach/Bowel: The stomach is normal. The small bowel loops have a normal course and caliber. Normal appearance of the colon. Vascular/Lymphatic: Aortic atherosclerosis. No upper abdominal adenopathy. No pelvic or inguinal adenopathy. Reproductive: Prostate is unremarkable. Other: No abdominal wall hernia or abnormality. No abdominopelvic ascites. Musculoskeletal: No suspicious bone lesions. Degenerative disc disease identified. L2-3 vertebral fusion may be postsurgical or congenital. IMPRESSION: 1. Solid nodule within the perifissural right middle lobe is stable from previous exam. The nodular densities within the right middle lobe have decreased in size when compared with 06/05/15 favoring a benign process. 2. Borderline enlarged sub- carinal lymph node appears mildly increased in  size from previous exam. 11 mm on today's study versus 7 mm previously. 3. No mass or adenopathy identified within the abdomen or pelvis. 4.  Aortic Atherosclerosis (ICD10-I70.0). Electronically Signed   By: Kerby Moors M.D.   On: 06/22/2016 12:46   Mr Jodene Nam Head/brain VC Cm  Result Date: 06/22/2016 CLINICAL DATA:  Initial evaluation for slurred speech, altered mental status. EXAM: MRA HEAD WITHOUT CONTRAST TECHNIQUE: Angiographic images of the Circle of Willis were obtained using MRA technique without intravenous contrast. COMPARISON:  Prior MRI from earlier same day. FINDINGS: ANTERIOR CIRCULATION: Study degraded by motion artifact. Distal cervical segments of the internal carotid arteries are patent with antegrade flow. Petrous, cavernous, and supraclinoid segments patent  without flow-limiting stenosis. ICA termini widely patent. A1 segments patent bilaterally. Anterior communicating artery normal. Anterior cerebral arteries patent to their distal aspects. M1 segments patent without stenosis or occlusion. No proximal M2 occlusion. Distal MCA branches well opacified and symmetric. POSTERIOR CIRCULATION: Vertebral arteries patent to the vertebrobasilar junction without stenosis. Posterior inferior cerebral arteries patent proximally. Basilar artery widely patent to its distal aspect. Superior cerebellar is patent bilaterally. Both of the posterior cerebral artery supplied via the basilar and are widely patent to their distal aspects. No aneurysm or vascular malformation. IMPRESSION: Negative intracranial MRA. No large or proximal arterial branch occlusion. No high-grade or correctable stenosis. Electronically Signed   By: Jeannine Boga M.D.   On: 06/22/2016 00:21    Lab Data:  CBC:  Recent Labs Lab 06/21/16 1118 06/21/16 1144 06/23/16 0631  WBC 7.8  --  12.8*  NEUTROABS 5.1  --   --   HGB 13.9 14.3 13.5  HCT 42.3 42.0 41.6  MCV 98.4  --  97.2  PLT 312  --  944   Basic Metabolic Panel:  Recent Labs Lab 06/18/16 0918 06/21/16 1118 06/21/16 1144 06/23/16 0631  NA 135 135 136 134*  K 4.0 4.6 4.7 4.0  CL 100 101 99* 103  CO2 22 26  --  22  GLUCOSE 129* 97 93 96  BUN 18 13 14 14   CREATININE 1.27* 1.17 1.10 1.08  CALCIUM 9.3 9.0  --  8.9   GFR: Estimated Creatinine Clearance: 70.4 mL/min (by C-G formula based on SCr of 1.08 mg/dL). Liver Function Tests:  Recent Labs Lab 06/21/16 1118  AST 22  ALT 16*  ALKPHOS 76  BILITOT 0.6  PROT 7.1  ALBUMIN 4.0   No results for input(s): LIPASE, AMYLASE in the last 168 hours. No results for input(s): AMMONIA in the last 168 hours. Coagulation Profile:  Recent Labs Lab 06/21/16 1118  INR 0.98   Cardiac Enzymes: No results for input(s): CKTOTAL, CKMB, CKMBINDEX, TROPONINI in the last 168  hours. BNP (last 3 results)  Recent Labs  03/30/16 1047 04/07/16 1112  PROBNP 386* 117   HbA1C:  Recent Labs  06/22/16 0607  HGBA1C 6.0*   CBG: No results for input(s): GLUCAP in the last 168 hours. Lipid Profile:  Recent Labs  06/22/16 0607  CHOL 122  HDL 42  LDLCALC 36  TRIG 220*  CHOLHDL 2.9   Thyroid Function Tests: No results for input(s): TSH, T4TOTAL, FREET4, T3FREE, THYROIDAB in the last 72 hours. Anemia Panel: No results for input(s): VITAMINB12, FOLATE, FERRITIN, TIBC, IRON, RETICCTPCT in the last 72 hours. Urine analysis:    Component Value Date/Time   COLORURINE YELLOW 06/21/2016 1945   APPEARANCEUR CLOUDY (A) 06/21/2016 1945   APPEARANCEUR Clear 05/24/2016 1114   LABSPEC 1.013 06/21/2016  [place] 6.0 06/21/2016 [place] 06/21/2016 1945   HGBUR NEGATIVE 06/21/2016 1945   BILIRUBINUR NEGATIVE 06/21/2016 1945   BILIRUBINUR Negative 05/24/2016 [place] 06/21/2016 1945   PROTEINUR NEGATIVE 06/21/2016 1945   UROBILINOGEN 0.2 07/04/2013 1050   NITRITE NEGATIVE 06/21/2016 1945   LEUKOCYTESUR LARGE (A) 06/21/2016 1945   LEUKOCYTESUR 1+ (A) 05/24/2016 1114     Ripudeep Rai M.D. [facility] 06/23/2016, 3:25 PM  Pager: [phone] Between 7am to 7pm - call Pager - [phone]  After 7pm go to [email or web address] - password [email or web address]  Call night coverage person covering after 7pm

## 2016-06-23 NOTE — Progress Notes (Signed)
STROKE TEAM PROGRESS NOTE   SUBJECTIVE (INTERVAL HISTORY) His wife is at the bedside.  The patient still has partial expressive aphasia and anomia. Will start eliquis today. Pt UA showed UTI and he has scheduled outpt renal US tomorrow, we will try to see if we can do it here today.    OBJECTIVE Temp:  [97.5 F (36.4 C)-99 F (37.2 C)] 97.9 F (36.6 C) (06/20 1012) Pulse Rate:  [63-75] 69 (06/20 1012) Cardiac Rhythm: Heart block (06/20 0701) Resp:  [15-18] 16 (06/20 1012) BP: (119-146)/(59-74) 124/64 (06/20 1012) SpO2:  [95 %-100 %] 99 % (06/20 1012)  CBC:   Recent Labs Lab 06/21/16 1118 06/21/16 1144 06/23/16 0631  WBC 7.8  --  12.8*  NEUTROABS 5.1  --   --   HGB 13.9 14.3 13.5  HCT 42.3 42.0 41.6  MCV 98.4  --  97.2  PLT 312  --  625    Basic Metabolic Panel:   Recent Labs Lab 06/21/16 1118 06/21/16 1144 06/23/16 0631  NA 135 136 134*  K 4.6 4.7 4.0  CL 101 99* 103  CO2 26  --  22  GLUCOSE 97 93 96  BUN 13 14 14   CREATININE 1.17 1.10 1.08  CALCIUM 9.0  --  8.9    Lipid Panel:     Component Value Date/Time   CHOL 122 06/22/2016 0607   TRIG 220 (H) 06/22/2016 0607   HDL 42 06/22/2016 0607   CHOLHDL 2.9 06/22/2016 0607   VLDL 44 (H) 06/22/2016 0607   LDLCALC 36 06/22/2016 0607   HgbA1c:  Lab Results  Component Value Date   HGBA1C 6.0 (H) 06/22/2016   Urine Drug Screen:     Component Value Date/Time   LABOPIA POSITIVE (A) 06/15/2015 1907   COCAINSCRNUR NONE DETECTED 06/15/2015 1907   LABBENZ NONE DETECTED 06/15/2015 1907   AMPHETMU NONE DETECTED 06/15/2015 1907   THCU NONE DETECTED 06/15/2015 1907   LABBARB NONE DETECTED 06/15/2015 1907    Alcohol Level     Component Value Date/Time   ETH <5 06/15/2015 1040    IMAGING I have personally reviewed the radiological images below and agree with the radiology interpretations.  Ct Head Wo Contrast  06/21/2016 IMPRESSION: Chronic ischemic changes and mild atrophic changes similar to that seen  on previous exam. No acute abnormality is identified.   Ct Chest W Contrast Ct Abdomen Pelvis W Contrast 06/22/2016 IMPRESSION: 1. Solid nodule within the perifissural right middle lobe is stable from previous exam. The nodular densities within the right middle lobe have decreased in size when compared with 06/05/15 favoring a benign process. 2. Borderline enlarged sub- carinal lymph node appears mildly increased in size from previous exam. 11 mm on today's study versus 7 mm previously. 3. No mass or adenopathy identified within the abdomen or pelvis. 4.  Aortic Atherosclerosis (ICD10-I70.0).  Mr Brain Wo Contrast 06/21/2016 IMPRESSION: 1. Small acute/early subacute infarct within the left lateral parietal lobe and subcentimeter focus in left superior parietal lobe. No hemorrhage or significant associated mass effect. 2. Background of moderate chronic microvascular ischemic changes and parenchymal volume loss of the brain. Several chronic lacunar infarcts in basal ganglia and cerebellum.  Mr Jodene Nam Head/brain Wo Cm 06/22/2016 IMPRESSION: Negative intracranial MRA. No large or proximal arterial branch occlusion. No high-grade or correctable stenosis.  LE venous doppler - No evidence of deep vein thrombosis or baker's cysts bilaterally.  TTE 04/2016 - Left ventricle: The cavity size was normal. There was mild focal  basal hypertrophy of the septum. Systolic function was normal.   The estimated ejection fraction was in the range of 60% to 65%.   Wall motion was normal; there were no regional wall motion   abnormalities. Doppler parameters are consistent with abnormal   left ventricular relaxation (grade 1 diastolic dysfunction). - Aortic valve: There was mild regurgitation. Impressions: - No cardiac source of emboli was indentified.   PHYSICAL EXAM  Temp:  [97.5 F (36.4 C)-99 F (37.2 C)] 97.9 F (36.6 C) (06/20 1012) Pulse Rate:  [63-75] 69 (06/20 1012) Resp:  [15-18] 16 (06/20 1012) BP:  (119-146)/(59-74) 124/64 (06/20 1012) SpO2:  [95 %-100 %] 99 % (06/20 1012)  General - Well nourished, well developed, in no apparent distress.  Ophthalmologic - Sharp disc margins OU.  Cardiovascular - Regular rate and rhythm.  Mental Status -  Level of arousal and orientation to time, place, and person were intact. Language exam showed hesitation of speech, partial expressive aphasia, able to follow commands, but mild anomia with naming 3/4, able to repeat but with hesitancy Fund of Knowledge was assessed and was intact.  Cranial Nerves II - XII - II - Visual field intact OU. III, IV, VI - Extraocular movements intact. V - Facial sensation intact bilaterally. VII - Facial movement intact bilaterally. VIII - Hearing & vestibular intact bilaterally. X - Palate elevates symmetrically. XI - Chin turning & shoulder shrug intact bilaterally. XII - Tongue protrusion intact.  Motor Strength - The patient's strength was normal in all extremities and pronator drift was absent.  Bulk was normal and fasciculations were absent.   Motor Tone - Muscle tone was assessed at the neck and appendages and was normal.  Reflexes - The patient's reflexes were 1+ in all extremities and he had no pathological reflexes.  Sensory - Light touch, temperature/pinprick were assessed and were symmetrical.    Coordination - The patient had normal movements in the hands with no ataxia or dysmetria.  Tremor was absent.  Gait and Station - deferred.   ASSESSMENT/PLAN [name] [name] is a 76 y.o. male with history of two prior strokes, CAD, BPH s/p TURP, CKD, COPD and multiple sclerosis presenting with aphasia and left facial droop. He did not receive IV t-PA due to arrive outside of the treatment window.   Recurrent embolic stroke: left MCA acute/early subacute infarct at left lateral parietal lobe and left superior parietal lobe in the setting of two prior strokes, likely embolic, source unknown    Resultant  Mild expressive aphasia and anomia  CT head: No acute abnormalities  MRI head: Small acute/early subacute infarct within the left lateral parietal lobe and subcentimeter focus in left superior parietal lobe. Several chronic lacunar infarcts in basal ganglia and cerebellum.  MRA head unremarkable  2D Echo 04/2016 - EF 60-65%  Loop interrogation showed no afib  Pan CT no evidence of cancer metastatsis  LDL 36  HgbA1c 5.6  Lovenox 40 mg sq daily for VTE prophylaxis Diet Heart Room service appropriate? Yes; Fluid consistency: Thin  Aggrenox prior to admission, now on Aggrenox. Discussed with pt and family, due to recurrent cardioembolic infarcts with unclear source, will initiate eliquis 5mg  bid.   Patient counseled to be compliant with his antithrombotic medications  Ongoing aggressive stroke risk factor management  Therapy recommendations: pending  Disposition:  Pending  Hx of stroke  06/15/2015 for right MCA infarcts at right insular and posterior frontal lobe. CT head, carotid Doppler, TTE were all negative. LDL  91 and A1c 6.4. His aspirin 81 changed to 325, resumed Lipitor 20  06/20/2015 recrudescence of previous stroke in the setting of infection and presyncope. DVT and TEE negative without PFO. Loop recorder placed. Aspirin was changed to Plavix on discharge.   04/04/16 small right cerebellar and right occipital lobe with left ACA punctate infarct s/p tPA, again embolic pattern. CTA head and neck only showed left VA stenosis. EF 55-60%. TCD bubble study negative for PFO, and loop recorder showed no afib. LDL 61 and A1C 5.6. His plavix changed to aggrenox on discharge. CTA chest showed fusiform dilatation of ascending aorta and mild to moderate thoracic descending aorta atherosclerosis, which can not explain the stroke.  Urethral cancer with recurrent UTI  follows with [facility] and [facility]  no recurrence or metastasis found.   Pan CT no evidence of mets  this time  UA WBC TNTC  Renal ultrasound scheduled tomorrow as outpt  Hypertension  Stable  Permissive hypertension (OK if < 220/120) but gradually normalize in 5-7 days  Long-term BP goal normotensive  Hyperlipidemia  Home meds: atorvastatin 40mg  PO daily resumed in hospital  LDL 36, goal < 70  Continue statin at discharge  Other Stroke Risk Factors  Advanced age  Former cigarette smoker, 80 pack-years, quit 1995  ETOH use, advised to drink no more than 2 drink(s) a day  Obesity, Body mass index is 30.94 kg/m., recommend weight loss, diet and exercise as appropriate   Family hx stroke (mother, father)  Coronary artery disease  Diastolic heart failure  Other Active Problems  CKD  COPD  Lung nodule - decreased in size  Multiple sclerosis - not active   Hospital day # 1  Neurology will sign off. Please call with questions. Pt will follow up with Dr. Erlinda Hong at [facility] in about 6 weeks. Thanks for the consult.   Rosalin Hawking, MD PhD Stroke Neurology 06/23/2016 1:58 PM  To contact Stroke Continuity provider, please refer to [email or web address] After hours, contact General Neurology

## 2016-06-24 ENCOUNTER — Ambulatory Visit: Payer: Medicare Other

## 2016-06-24 ENCOUNTER — Telehealth: Payer: Self-pay | Admitting: Family Medicine

## 2016-06-24 ENCOUNTER — Ambulatory Visit (HOSPITAL_COMMUNITY): Admission: RE | Admit: 2016-06-24 | Payer: Medicare Other | Source: Ambulatory Visit

## 2016-06-24 LAB — URINE CULTURE

## 2016-06-24 NOTE — Telephone Encounter (Signed)
pts wife calling wanting to give verbal update to you about patient, please call back.

## 2016-06-24 NOTE — Progress Notes (Signed)
Carelink Summary Report / Loop Recorder 

## 2016-06-24 NOTE — Telephone Encounter (Signed)
Spoke to pt's wife and appt made.

## 2016-06-25 ENCOUNTER — Encounter: Payer: Self-pay | Admitting: Family Medicine

## 2016-06-25 ENCOUNTER — Telehealth: Payer: Self-pay | Admitting: Internal Medicine

## 2016-06-25 ENCOUNTER — Ambulatory Visit (INDEPENDENT_AMBULATORY_CARE_PROVIDER_SITE_OTHER): Payer: Medicare Other | Admitting: Family Medicine

## 2016-06-25 VITALS — BP 110/58 | HR 72 | Temp 98.1°F | Resp 20 | Ht 71.0 in | Wt 214.0 lb

## 2016-06-25 DIAGNOSIS — I639 Cerebral infarction, unspecified: Secondary | ICD-10-CM | POA: Diagnosis not present

## 2016-06-25 DIAGNOSIS — N3000 Acute cystitis without hematuria: Secondary | ICD-10-CM | POA: Diagnosis not present

## 2016-06-25 DIAGNOSIS — Z8673 Personal history of transient ischemic attack (TIA), and cerebral infarction without residual deficits: Secondary | ICD-10-CM | POA: Diagnosis not present

## 2016-06-25 DIAGNOSIS — Z09 Encounter for follow-up examination after completed treatment for conditions other than malignant neoplasm: Secondary | ICD-10-CM

## 2016-06-25 MED ORDER — NITROFURANTOIN MONOHYD MACRO 100 MG PO CAPS: 100.0000 mg | ORAL_CAPSULE | Freq: Two times a day (BID) | ORAL | 0 refills | Status: DC

## 2016-06-25 NOTE — Telephone Encounter (Signed)
Called and spoke with Corene Cornea from [facility]---he stated that the ONO that they have on file is going to expire in the 30 days before the pt is going to be seen on 7/23.  Pt is scheduled for a 6 MWT on 6/28 and then will see SG in July.    Corene Cornea is concerned since medicare will start denying all of the tests that have been done since it has not all been done in the 30 day period.   MR any suggestions?  SG and TP are both booked out until the end of July and you have no availability.  thanks

## 2016-06-25 NOTE — Telephone Encounter (Signed)
Per MR: pt will need 2lpm at night due to nocturnal desaturation.   Called and spoke to pt's wife. Informed her of the results and recs per MR. She verbalized understanding. There is already an order for pt's O2 that was placed previously, I have sent a staff message to Darlina Guys to see if that order can still work.   Will await message back from [facility]

## 2016-06-25 NOTE — Progress Notes (Signed)
Subjective:    Patient ID: [name] [name], male    DOB: September 30, 1940, 76 y.o.   MRN: [id number]  HPI  Patient is here today for hospital discharge follow-up. He was recently admitted for stroke.  Patient was past the window for TPA. Thankfully symptoms improved gradually. Neurology was consult and given his history of multiple cryptogenic strokes, they recommended switching the patient to eliquis and discontinuing Aggrenox. He was also found to have an Escherichia coli urinary tract infection in the hospital and was treated empirically with Levaquin. Urine cultures were not back yet. He has had numerous recurrent urinary tract infections as documented in his chart. Patient performs in and out catheterization several times a day. Cultures show Escherichia coli that is resistant to all antibiotics including the Levaquin he is taking except for nitrofurantoin. He feels much weaker and is getting worse after leaving the hospital making me suspicious that the infection is causing his fatigue. I asked the patient regarding his technique, he is using a reusable catheter that he washes with soap and water and air dries. However he is inserting it with his bare hands which may contaminate the catheter and may introduce bacteria into his urinary tract. Past Medical History:  Diagnosis Date  . Arthritis    s/p TKR  . Bladder cancer (HCC)    [facility], [facility] low grade papillary urothileal carcinoma  . BPH (benign prostatic hyperplasia)   . Brugada syndrome    Possible Type II Brugada ECG pattern. No family history of SCD, no syncope, no tachypalpitations.  Marland Kitchen CAD (coronary artery disease)    a. LHC 1/15 - mid LCx 50 and 60%, proximal RCA 50%  . Chronic diastolic CHF (congestive heart failure) ([place])   . CKD (chronic kidney disease)   . COPD (chronic obstructive pulmonary disease) ([place])    Prior heavy smoker. PFTs (3/11): FVC 87%, FEV1 73%, ratio 0.57, DLCO 75%, TLC 121%. Moderate obstructive defect. PFTs (9/15):  Only minimal obstruction, sugPrior heavy smoker. PFTs (3/11): FVC 87%, FEV1 73%, ratio 0.57, DLCO 75%, TLC 121%. Moderate obstructive defect. PFTs (9/15) minimal obstruction, poss asthma component   . Depression    with bipolar tendencies  . DOE (dyspnea on exertion)    a. Myoview 8/09- EF 57%, no ischemia // b. Myoview (3/11) EF 68%, diaphragmatic attenuation, no ischemia //  c Echo (4/11) EF 47-65%, mild diastolic dysfunction, PASP 38 mmHg // d. PFTs 9/15 minimal obstruction  /  e. CT negative for ILD  . GERD (gastroesophageal reflux disease)   . History of Doppler ultrasound    Carotid US 3/11 negative for significant stenosis  //  carotid US 6/17: Mild bilateral plaque, 1-39% ICA  . History of echocardiogram    a. Echo 12/14 Inferior and distal septal HK, mild LVH, EF 50-55%, mild MR, mild LAE  //  b. Echo 6/17: Mild focal basal septal hypertrophy, EF 60-65%, normal wall motion, grade 1 diastolic dysfunction, mild AI  . HLD (hyperlipidemia)   . Hypertensive heart disease with CHF (congestive heart failure) ([place]) 04/08/2009  . Low testosterone   . Lung nodule    a. CT in 2015 >> PET in 12/15 not sugg of malignancy   . LVH (left ventricular hypertrophy)    a. Echo 12/14 Inferior and distal septal HK, mild LVH, EF 50-55%, mild MR, mild LAE  . Mild dementia   . Mitral regurgitation    Echo (4/11) with PISA ERO 0.3 cm^2 and regurgitant volume 41 mL (moderate MR).  Echo (12/14) with mild MR.   . MS (multiple sclerosis) ([place])   . MS (multiple sclerosis) ([place])   . Right bundle branch block   . Stroke ([place])   . Thoracic aortic aneurysm (HCC)    4.1 cm 2017  . Urinary retention with incomplete bladder emptying    receives botox injections and treatment for BPH through [facility]   Past Surgical History:  Procedure Laterality Date  . APPENDECTOMY    . EP IMPLANTABLE DEVICE N/A 06/23/2015   Procedure: Loop Recorder Insertion;  Surgeon: Deboraha Sprang, MD;  Location: [place] CV LAB;  Service:  Cardiovascular;  Laterality: N/A;  . HEMORRHOID SURGERY    . LEFT HEART CATHETERIZATION WITH CORONARY ANGIOGRAM N/A 01/05/2013   Procedure: LEFT HEART CATHETERIZATION WITH CORONARY ANGIOGRAM;  Surgeon: Larey Dresser, MD;  Location: [facility] CATH LAB;  Service: Cardiovascular;  Laterality: N/A;  . ROTATOR CUFF REPAIR    . TEE WITHOUT CARDIOVERSION N/A 06/23/2015   Procedure: TRANSESOPHAGEAL ECHOCARDIOGRAM (TEE);  Surgeon: Fay Records, MD;  Location: [facility] ENDOSCOPY;  Service: Cardiovascular;  Laterality: N/A;  . TONSILLECTOMY    . TOTAL KNEE ARTHROPLASTY Right   . TRANSURETHRAL RESECTION OF PROSTATE     Current Outpatient Prescriptions on File Prior to Visit  Medication Sig Dispense Refill  . acetaminophen (TYLENOL) 325 MG tablet Take 650 mg by mouth every 6 (six) hours as needed (for pain or headaches).     Marland Kitchen amantadine (SYMMETREL) 100 MG capsule TAKE 1 CAPSULE BY MOUTH TWICE DAILY WITH BREAKFAST AND LUNCH 180 capsule 2  . apixaban (ELIQUIS) 5 MG TABS tablet Take 1 tablet (5 mg total) by mouth 2 (two) times daily. 60 tablet 3  . atorvastatin (LIPITOR) 40 MG tablet Take 1 tablet (40 mg total) by mouth daily. 90 tablet 3  . bisoprolol (ZEBETA) 10 MG tablet Take 1 tablet (10 mg total) by mouth daily. 90 tablet 3  . cholecalciferol (VITAMIN D) 400 units TABS tablet Take 400 Units by mouth daily.     . Cyanocobalamin (B-12) 1000 MCG CAPS Take 1,000 mcg by mouth daily.     . folic acid (FOLVITE) 1 MG tablet Take 1 mg by mouth daily.    . furosemide (LASIX) 40 MG tablet Take 40 mg by mouth daily.    Marland Kitchen HYDROcodone-acetaminophen (NORCO) 10-325 MG tablet Take 1 tablet by mouth every 6 (six) hours as needed for moderate pain or severe pain. 100 tablet 0  . levofloxacin (LEVAQUIN) 500 MG tablet Take 1 tablet (500 mg total) by mouth daily. X 7 7 tablet 0  . losartan (COZAAR) 50 MG tablet Take 1 tablet (50 mg total) by mouth daily. 90 tablet 4  . MAGNESIUM CITRATE PO Take 0.5-1 Bottles by mouth once as needed (for  mild constipation).     . Multiple Vitamins-Minerals (DRY EYE FORMULA) CAPS Place 1-2 drops into both eyes 4 (four) times daily as needed (for dry eyes).    . OXYGEN Inhale 2 L into the lungs at bedtime.    . pantoprazole (PROTONIX) 40 MG tablet Take 1 tablet (40 mg total) by mouth daily. 90 tablet 3  . PROAIR HFA 108 (90 Base) MCG/ACT inhaler USE 1 TO 2 INHALATIONS EVERY 6 HOURS AS NEEDED FOR WHEEZING OR SHORTNESS OF BREATH 25.5 g 4  . rOPINIRole (REQUIP) 1 MG tablet TAKE 1 TABLET AT BEDTIME 90 tablet 3  . SPIRIVA HANDIHALER 18 MCG inhalation capsule INHALE THE CONTENTS OF 1 CAPSULE WITH 2  INHALATIONS ONCE DAILY AS DIRECTED (Patient taking differently: Inhale the contents of 1 capsule and inhale once a day) 1 capsule 11  . venlafaxine XR (EFFEXOR-XR) 150 MG 24 hr capsule TAKE 1 CAPSULE TWICE A DAY 180 capsule 3  . doxazosin (CARDURA) 2 MG tablet Take 1 tablet (2 mg total) by mouth daily. (Patient not taking: Reported on 06/25/2016) 30 tablet 2   No current facility-administered medications on file prior to visit.    Allergies  Allergen Reactions  . Acetylcholine     Patient was suspected of having "Brugada Syndrome": (BrS) is a genetic condition that results in abnormal electrical activity within the heart, increasing the risk of sudden cardiac death. Those affected may have episodes of passing out.  Marland Kitchen Alcohol-Sulfur [Sulfur] Other (See Comments)    Patient was suspected of having "Brugada Syndrome"  . Amitriptyline Other (See Comments)    Patient was suspected of having "Brugada Syndrome"  . Bupivacaine Other (See Comments)    Patient was suspected of having "Brugada Syndrome"  . Clomipramine Hcl Other (See Comments)    Patient was suspected of having "Brugada Syndrome"  . Cocaine Other (See Comments)    Patient was suspected of having "Brugada Syndrome"  . Desipramine Other (See Comments)    Patient was suspected of having "Brugada Syndrome"  . Ergonovine Other (See Comments)     Patient was suspected of having "Brugada Syndrome"  . Flecainide Other (See Comments)    Patient was suspected of having "Brugada Syndrome"  . Lithium Other (See Comments)    Patient was suspected of having "Brugada Syndrome"  . Loxapine Other (See Comments)    Patient was suspected of having "Brugada Syndrome"  . Nortriptyline Other (See Comments)    Patient was suspected of having "Brugada Syndrome"  . Oxcarbazepine Other (See Comments)    Patient was suspected of having "Brugada Syndrome"  . Procainamide Other (See Comments)    Patient was suspected of having "Brugada Syndrome"  . Procaine Other (See Comments)    Patient was suspected of having "Brugada Syndrome"  . Propafenone Other (See Comments)    Patient was suspected of having "Brugada Syndrome"  . Propofol Other (See Comments)    Patient was suspected of having "Brugada Syndrome"  . Trifluoperazine Other (See Comments)    Patient was suspected of having "Brugada Syndrome"   Social History   Social History  . Marital status: Married    Spouse name: N/A  . Number of children: N/A  . Years of education: N/A   Occupational History  . retired Retired   Social History Main Topics  . Smoking status: Former Smoker    Packs/day: 2.00    Years: 40.00    Types: Cigarettes    Quit date: 01/04/1993  . Smokeless tobacco: Former Systems developer    Types: Chew     Comment: patient still smokes a pipe daily  . Alcohol use 15.0 oz/week    25 Cans of beer per week     Comment: 12 pack a week  . Drug use: No  . Sexual activity: Not on file   Other Topics Concern  . Not on file   Social History Narrative   Retired from the [employer] after 20 years   [place] Veteran    Review of Systems  All other systems reviewed and are negative.      Objective:   Physical Exam  Constitutional: He is oriented to person, place, and time. He appears well-developed and well-nourished.  Neck: Neck supple.  Cardiovascular: Normal rate,  regular rhythm and normal heart sounds.   No murmur heard. Pulmonary/Chest: Effort normal and breath sounds normal. No respiratory distress. He has no wheezes. He has no rales.  Abdominal: Soft. Bowel sounds are normal. He exhibits no distension. There is no tenderness. There is no rebound and no guarding.  Musculoskeletal: He exhibits edema.  Lymphadenopathy:    He has no cervical adenopathy.  Neurological: He is alert and oriented to person, place, and time. No cranial nerve deficit. He exhibits normal muscle tone. Coordination normal.  Vitals reviewed.         Assessment & Plan:  Hospital discharge follow-up  Acute cystitis without hematuria  History of CVA (cerebrovascular accident)  Discontinue Levaquin. Replace with nitrofurantoin 100 mg twice daily for 7 days. Reassess in one week. Increase fluid consumption. We discussed using sterile and out catheter supplies to prevent recurrent urinary tract infections. We also discussed proper technique. I gave the patient several catheter kits that he can use at home that are sterile, 1 use catheter kits. I also gave him gloves to use to prevent contamination of the catheter supplies. Hopefully this will help reduce the recurrence of urinary tract infections. Only time will tell if the transition from Aggrenox to Eliquis will help prevent future cryptogenic strokes. Follow up as planned with neurology and urology.

## 2016-06-26 ENCOUNTER — Encounter: Payer: Self-pay | Admitting: Family Medicine

## 2016-06-28 NOTE — Telephone Encounter (Signed)
[name] [name], have you heard anything from [place] yet?

## 2016-06-28 NOTE — Telephone Encounter (Signed)
Larwance Sachs, RN        I left a triage message on this one. The pt hasn't had an ov within the last 30 days. The ono is good, but we would still need an OV to qualify. Thanks!    There was an opening on TP's schedule, appt made for pt on 07/05/2016. Called and spoke to pt's wife. Informed her of the appt. Pt's wife verbalized understanding and denied any further questions or concerns at this time.

## 2016-06-28 NOTE — Telephone Encounter (Signed)
See phone note from 6.14.18, nothing further needed. Will sign off.

## 2016-06-29 ENCOUNTER — Other Ambulatory Visit: Payer: Self-pay | Admitting: Family Medicine

## 2016-06-29 MED ORDER — APIXABAN 5 MG PO TABS: 5.0000 mg | ORAL_TABLET | Freq: Two times a day (BID) | ORAL | 3 refills | Status: DC

## 2016-06-30 LAB — CUP PACEART REMOTE DEVICE CHECK
Date Time Interrogation Session: 20180619200853
Implantable Pulse Generator Implant Date: 20170619

## 2016-07-01 ENCOUNTER — Ambulatory Visit: Payer: Medicare Other

## 2016-07-02 ENCOUNTER — Telehealth: Payer: Self-pay

## 2016-07-02 NOTE — Telephone Encounter (Signed)
/   since TP is not going to be here. I called to reschedule this appt from 7/2 since TP is not going to be here. h really needs his oxygen and he has to re qualify.

## 2016-07-05 ENCOUNTER — Ambulatory Visit: Payer: Medicare Other | Admitting: Adult Health

## 2016-07-05 NOTE — Telephone Encounter (Signed)
I CALLED HIS WIFE TODAY AND SCHEDULED HIM TO SEE DR. Melvyn Novas TOMORROW AFTERNOON.

## 2016-07-06 ENCOUNTER — Ambulatory Visit: Payer: Medicare Other | Admitting: Physician Assistant

## 2016-07-06 ENCOUNTER — Ambulatory Visit (INDEPENDENT_AMBULATORY_CARE_PROVIDER_SITE_OTHER): Payer: Medicare Other | Admitting: Internal Medicine

## 2016-07-06 ENCOUNTER — Encounter: Payer: Self-pay | Admitting: Internal Medicine

## 2016-07-06 ENCOUNTER — Ambulatory Visit: Payer: Medicare Other | Attending: Neurology | Admitting: Occupational Therapy

## 2016-07-06 VITALS — BP 116/78 | HR 64 | Ht 71.0 in | Wt 210.0 lb

## 2016-07-06 VITALS — BP 116/64 | HR 63

## 2016-07-06 DIAGNOSIS — I69318 Other symptoms and signs involving cognitive functions following cerebral infarction: Secondary | ICD-10-CM

## 2016-07-06 DIAGNOSIS — R41842 Visuospatial deficit: Secondary | ICD-10-CM | POA: Diagnosis not present

## 2016-07-06 DIAGNOSIS — R41841 Cognitive communication deficit: Secondary | ICD-10-CM | POA: Insufficient documentation

## 2016-07-06 DIAGNOSIS — I639 Cerebral infarction, unspecified: Secondary | ICD-10-CM

## 2016-07-06 DIAGNOSIS — J449 Chronic obstructive pulmonary disease, unspecified: Secondary | ICD-10-CM | POA: Diagnosis not present

## 2016-07-06 MED ORDER — TIOTROPIUM BROMIDE MONOHYDRATE 2.5 MCG/ACT IN AERS: 2.0000 | INHALATION_SPRAY | Freq: Every day | RESPIRATORY_TRACT | 0 refills | Status: DC

## 2016-07-06 MED ORDER — TIOTROPIUM BROMIDE MONOHYDRATE 2.5 MCG/ACT IN AERS: 2.0000 | INHALATION_SPRAY | Freq: Every day | RESPIRATORY_TRACT | 11 refills | Status: DC

## 2016-07-06 NOTE — Progress Notes (Signed)
Subjective:     Patient ID: [name] [name], male   DOB: 1940-09-15,  .   MRN: [id number]    07/06/2016  f/u ov/Jerran Tappan re: unexplained sob/ does not meet criteria for copd s/p smoking cessation in 1995 Chief Complaint  Patient presents with  . Follow-up    Pt states here to qualify for ambulatory o2. His breathing is unchanged since his last visit with Dr Chase Caller. He uses his albuterol inhaler 2 x per wk on average.      doe is highly variable and could not be reproduced today during ov.  No rest or noct sob but "most days is sob across the room" and can't walk to end of street s stopping multiples times s assoc cp.   Not able to use hfa effectively / maint on spriva dpi   No obvious patterns in day to day or daytime variability or assoc excess/ purulent sputum or mucus plugs or hemoptysis or cp or chest tightness, subjective wheeze or overt sinus or hb symptoms. No unusual exp hx or h/o childhood pna/ asthma or knowledge of premature birth.  Sleeping ok without nocturnal  or early am exacerbation  of respiratory  c/o's or need for noct saba. Also denies any obvious fluctuation of symptoms with weather or environmental changes or other aggravating or alleviating factors except as outlined above   Current Medications, Allergies, Complete Past Medical History, Past Surgical History, Family History, and Social History were reviewed in [employer] record.  ROS  The following are not active complaints unless bolded sore throat, dysphagia, dental problems, itching, sneezing,  nasal congestion or excess/ purulent secretions, ear ache,   fever, chills, sweats, unintended wt loss, classically pleuritic or exertional cp,  orthopnea pnd or leg swelling, presyncope, palpitations, abdominal pain, anorexia, nausea, vomiting, diarrhea  or change in bowel or bladder habits, change in stools or urine, dysuria,hematuria,  rash, arthralgias, visual complaints, headache, numbness, weakness or  ataxia or problems with walking or coordination,  change in mood/affect or memory s/p multiple cva                Objective:   Physical Exam   amb elderly wm nad  - slowed mentation    Wt Readings from Last 3 Encounters:  07/06/16 210 lb (95.3 kg)  06/25/16 214 lb (97.1 kg)  06/21/16 215 lb 9.8 oz (97.8 kg)    Vital signs reviewed  - Note on arrival 02 sats  96% on RA     HEENT: nl dentition, turbinates bilaterally, and oropharynx. Nl external ear canals without cough reflex   NECK :  without JVD/Nodes/TM/ nl carotid upstrokes bilaterally   LUNGS: no acc muscle use,  Nl contour chest which is clear to A and P bilaterally without cough on insp or exp maneuvers   CV:  RRR  no s3 or murmur or increase in P2, and no edema   ABD:  soft and nontender with nl inspiratory excursion in the supine position. No bruits or organomegaly appreciated, bowel sounds nl  MS:  Nl gait/ ext warm without deformities, calf tenderness, cyanosis or clubbing No obvious joint restrictions   SKIN: warm and dry without lesions    NEURO:  alert,   nl sensorium with  no motor or cerebellar deficits apparent but seems to process questions/ instructions with a lot of hesitation       I personally reviewed images and agree with radiology impression as follows:   Chest CT with  contrast 06/22/16  1. Solid nodule within the perifissural right middle lobe is stable from previous exam. The nodular densities within the right middle lobe have decreased in size when compared with 06/05/15 favoring a benign process. 2. Borderline enlarged sub- carinal lymph node appears mildly increased in size from previous exam. 11 mm on today's study versus 7 mm previously.   Labs  reviewed:      Chemistry      Component Value Date/Time   NA 134 (L) 06/23/2016 0631   NA 134 04/07/2016 1112   K 4.0 06/23/2016 0631   CL 103 06/23/2016 0631   CO2 22 06/23/2016 0631   BUN 14 06/23/2016 0631   BUN 10 04/07/2016 1112    CREATININE 1.08 06/23/2016 0631   CREATININE 1.27 (H) 06/18/2016 0918      Component Value Date/Time   CALCIUM 8.9 06/23/2016 0631   ALKPHOS 76 06/21/2016 1118   AST 22 06/21/2016 1118   ALT 16 (L) 06/21/2016 1118   BILITOT 0.6 06/21/2016 1118        Lab Results  Component Value Date   WBC 12.8 (H) 06/23/2016   HGB 13.5 06/23/2016   HCT 41.6 06/23/2016   MCV 97.2 06/23/2016   PLT 262 06/23/2016      Lab Results  Component Value Date   TSH 1.20 06/04/2013     Lab Results  Component Value Date   PROBNP 117 04/07/2016                 Assessment:

## 2016-07-06 NOTE — Therapy (Signed)
[facility] [address] [place], [place], [address] Phone: [phone]   Fax:  [phone]  Occupational Therapy Treatment  Patient Details  Name: [name] [name] MRN: [id number] Date of Birth: [birth year] Referring Provider: Dr. Rosalin Hawking  Encounter Date: 07/06/2016      OT End of Session - 07/06/16 1512    Visit Number 1   Number of Visits 1   Date for OT Re-Evaluation 07/06/16  eval only    Authorization Type Medicare / [employer]   Authorization - Visit Number 1   Authorization - Number of Visits 10   OT Start Time 3664   OT Stop Time 1405   OT Time Calculation (min) 47 min   Activity Tolerance Patient tolerated treatment well   Behavior During Therapy [facility] for tasks assessed/performed      Past Medical History:  Diagnosis Date  . Arthritis    s/p TKR  . Bladder cancer (HCC)    [facility], [place] low grade papillary urothileal carcinoma  . BPH (benign prostatic hyperplasia)   . Brugada syndrome    Possible Type II Brugada ECG pattern. No family history of SCD, no syncope, no tachypalpitations.  [place] CAD (coronary artery disease)    a. LHC 1/15 - mid LCx 50 and 60%, proximal RCA 50%  . Chronic diastolic CHF (congestive heart failure) ([place])   . CKD (chronic kidney disease)   . COPD (chronic obstructive pulmonary disease) ([place])    Prior heavy smoker. PFTs (3/11): FVC 87%, FEV1 73%, ratio 0.57, DLCO 75%, TLC 121%. Moderate obstructive defect. PFTs (9/15): Only minimal obstruction, sugPrior heavy smoker. PFTs (3/11): FVC 87%, FEV1 73%, ratio 0.57, DLCO 75%, TLC 121%. Moderate obstructive defect. PFTs (9/15) minimal obstruction, poss asthma component   . Depression    with bipolar tendencies  . DOE (dyspnea on exertion)    a. Myoview 8/09- EF 57%, no ischemia // b. Myoview (3/11) EF 68%, diaphragmatic attenuation, no ischemia //  c Echo (4/11) EF 40-34%, mild diastolic dysfunction, PASP 38 mmHg // d. PFTs 9/15 minimal  obstruction  /  e. CT negative for ILD  . GERD (gastroesophageal reflux disease)   . History of Doppler ultrasound    Carotid US 3/11 negative for significant stenosis  //  carotid US 6/17: Mild bilateral plaque, 1-39% ICA  . History of echocardiogram    a. Echo 12/14 Inferior and distal septal HK, mild LVH, EF 50-55%, mild MR, mild LAE  //  b. Echo 6/17: Mild focal basal septal hypertrophy, EF 60-65%, normal wall motion, grade 1 diastolic dysfunction, mild AI  . HLD (hyperlipidemia)   . Hypertensive heart disease with CHF (congestive heart failure) ([place]) 04/08/2009  . Low testosterone   . Lung nodule    a. CT in 2015 >> PET in 12/15 not sugg of malignancy   . LVH (left ventricular hypertrophy)    a. Echo 12/14 Inferior and distal septal HK, mild LVH, EF 50-55%, mild MR, mild LAE  . Mild dementia   . Mitral regurgitation    Echo (4/11) with PISA ERO 0.3 cm^2 and regurgitant volume 41 mL (moderate MR). Echo (12/14) with mild MR.   . MS (multiple sclerosis) ([place])   . MS (multiple sclerosis) ([place])   . Right bundle branch block   . Stroke ([place])   . Thoracic aortic aneurysm (HCC)    4.1 cm 2017  . Urinary retention with incomplete bladder emptying    receives botox injections and treatment for  BPH through [facility]    Past Surgical History:  Procedure Laterality Date  . APPENDECTOMY    . EP IMPLANTABLE DEVICE N/A 06/23/2015   Procedure: Loop Recorder Insertion;  Surgeon: Deboraha Sprang, MD;  Location: [facility] CV LAB;  Service: Cardiovascular;  Laterality: N/A;  . HEMORRHOID SURGERY    . LEFT HEART CATHETERIZATION WITH CORONARY ANGIOGRAM N/A 01/05/2013   Procedure: LEFT HEART CATHETERIZATION WITH CORONARY ANGIOGRAM;  Surgeon: Larey Dresser, MD;  Location: [facility] CATH LAB;  Service: Cardiovascular;  Laterality: N/A;  . ROTATOR CUFF REPAIR    . TEE WITHOUT CARDIOVERSION N/A 06/23/2015   Procedure: TRANSESOPHAGEAL ECHOCARDIOGRAM (TEE);  Surgeon: Fay Records, MD;  Location: [facility] ENDOSCOPY;   Service: Cardiovascular;  Laterality: N/A;  . TONSILLECTOMY    . TOTAL KNEE ARTHROPLASTY Right   . TRANSURETHRAL RESECTION OF PROSTATE      Vitals:   07/06/16 1320  BP: 116/64  Pulse: 63        Subjective Assessment - 07/06/16 1333    Subjective  Wife reports incr confusion today after being in heat (mowing) feels like it is contributed to MS   Patient is accompained by: Family member  wife   Pertinent History hx of CVAs x3 (07/2015, 04/2016, 06/2016);  MS, CHF, depression, hx of multiple UTIs, CAD, urethral CA, COPD, aortic aneurysm, HTN, aphasia   Limitations fall risk   Patient Stated Goals improve memory and speech/reading   Currently in Pain? No/denies            [facility] OT Assessment - 07/06/16 0001      Assessment   Diagnosis CVA   Referring Provider Dr. Rosalin Hawking   Onset Date 06/21/16   Assessment hospitalized 6/18-6/20/18     Precautions   Precautions None     Balance Screen   Has the patient fallen in the past 6 months Yes   How many times? 3-4     Home  Environment   Family/patient expects to be discharged to: Private residence   Lives With Spouse  family nearby     Prior Function   Level of Independence Independent with basic ADLs;Independent with household mobility without device;Independent with community mobility with device   Vocation Retired     ADL   Toileting -  [place]  performing in/out cath    ADL comments BADLs mod I per pt/wife report.  Pt initially had difficulty remembering how to operate things (coffee maker), but now improved.  Wife reports incr confusion/trouble with memory today after being in heat/mowing (pt has MS)     IADL   Shopping Needs to be accompanied on any shopping trip   Light Housekeeping --  vacuumns, washes dishes, laundry, making bed   Prior Level of Function Meal Prep independent    Meal Prep --  makes sandwiches, used to grill   Prior Level of Function Community Mobility stopped prior to  recent CVA   Community Mobility --  no longer drives   Medication Management Takes responsibility if medication is prepared in advance in seperate dosage  baseline   Financial Management Requires supervision/minimal cuing     Mobility   Mobility Status --  foot drop from MS   Mobility Status Comments Pt uses rollator in community and for longer distances.     Written Expression   Dominant Hand Right   Handwriting 100% legible     Vision Assessment   Comment Pt able to complete clock drawing test accurately  with incr time and number cancellation sheet with 100% accuracy.  Pt reports that he is beginning to read some with difficulty with comprehension (but improving).  Pt/wife deny pt bumping into items or missing things visually.     Cognition   Overall Cognitive Status Impaired/Different from baseline  difficult to assess due to aphasia    Mini Mental State Exam  wife reports incr confusion/STM deficits after being out in heat today  pt has MS where heat may excerbate symptoms   Area of Impairment Memory  word finding deficits   Memory Decreased short-term memory  worse after being in heat today per wife   Attention Selective  in busy environment   Executive Function --  able to complete clock drawing accurately     Sensation   Additional Comments pt reports numbneess in chest     Coordination   9 Hole Peg Test Right;Left   Right 9 Hole Peg Test 39.69   Left 9 Hole Peg Test 44.78   Box and Blocks --   Coordination pt appears to demo slower processing in general     ROM / Strength   AROM / PROM / Strength AROM;Strength     AROM   Overall AROM  Within functional limits for tasks performed     Strength   Overall Strength Comments BUE proximal strength grossly 5/5     Hand Function   Right Hand Grip (lbs) 75   Left Hand Grip (lbs) 60                          OT Education - 07/06/16 1500    Education provided Yes   Education Details Recommended  against mowing due to heat and MS and reviewed education regarding heat affects on MS symptoms; OT eval results   Person(s) Educated Patient;Spouse   Methods Explanation   Comprehension Verbalized understanding                    Plan - 07/06/16 1515    Clinical Impression Statement Pt is a 76 y.o. male with hx of 3 CVAs (most recent 06/21/2016).   Pt/wife report new speech/cognitive deficits which have improved since hospital discharge.  Pt is performing BADLs and simple-mod complex IADLs mod I and appears close to baseline for these tasks per pt/wife.  Pt/wife report continued memory/speech deficits (with speech therapy evaluation scheduled for later this week).  Strength/coordination appear WNL and pt/wife report mobility appears to be at baseline.  Wife does report incr memory difficulty/confusion today after being in the heat this morning which may be due to MS (no other symptoms and BP WNL).  Pt with PMH that includes previous CVAs, MS, CHF, depression, hx of multiple UTIs, CAD, urethral CA, COPD, aortic aneurysm, HTN.  Pt appears to be close to baseline for BADLs/IADLs per wife/pt with no outpatient occupational therapy indicated at this time.  Pt has speech therapy evaluation scheduled to address speech/memory deficits.   OT Frequency One time visit  eval only   OT Treatment/Interventions Self-care/ADL training;Patient/family education   Plan eval only at this time.  Pt scheduled for speech therapy evaluation this week for speech/memory deficits.   Clinical Decision Making Limited treatment options, no task modification necessary   Consulted and Agree with Plan of Care Family member/caregiver;Patient   Family Member Consulted wife      Patient will benefit from skilled therapeutic intervention in order to improve the following  deficits and impairments:     Visit Diagnosis: Other symptoms and signs involving cognitive functions following cerebral infarction      G-Codes -  2016/07/21 1530    Functional Assessment Tool Used (Outpatient only) performing BADLs, simple home maintence tasks mod I, medication management with set-up, financial management with supervision   Functional Limitation Self care   Self Care Current Status [phone]) At least 1 percent but less than 20 percent impaired, limited or restricted   Self Care Goal Status (B1478) At least 1 percent but less than 20 percent impaired, limited or restricted   Self Care Discharge Status [phone]) At least 1 percent but less than 20 percent impaired, limited or restricted      Problem List Patient Active Problem List   Diagnosis Date Noted  . Urethral cancer ([place])   . Acute ischemic stroke ([place]) 06/22/2016  . Stroke (cerebrum) ([place]) 06/21/2016  . Expressive aphasia   . Essential hypertension 05/17/2016  . Family history of stroke 04/06/2016  . Cognitive deficits 04/06/2016  . Cerebrovascular accident (CVA) due to embolism of cerebral artery ([place]) 04/04/2016  . Syncope and collapse   . Dysarthria   . Acute cystitis without hematuria   . History of ischemic right MCA stroke   . Slurred speech 06/20/2015  . AKI (acute kidney injury) ([place]) 06/20/2015  . Thoracic aortic aneurysm ([place])   . Hyperlipidemia 06/05/2015  . Gastroesophageal reflux disease without esophagitis 06/05/2015  . COPD (chronic obstructive pulmonary disease) ([place]) 09/04/2013  . Lung nodule 09/04/2013  . Cancer ([place])   . Coronary artery disease involving native coronary artery of native heart without angina pectoris 02/01/2013  . Chronic diastolic CHF (congestive heart failure) ([place]) 01/03/2013  . Depression   . Urinary retention with incomplete bladder emptying   . BPH (benign prostatic hyperplasia)   . Brugada syndrome 08/23/2011  . Hypertensive heart disease with CHF (congestive heart failure) ([place]) 04/08/2009  . Mitral valve disorder 04/08/2009  . CAROTID BRUIT, RIGHT 03/06/2009  . SHORTNESS OF BREATH 03/06/2009  . OTHER CHEST  PAIN 03/06/2009  . Possible Multiple sclerosis ([place]) 10/11/2006    [facility] 2016-07-21, 4:59 PM  [facility] [address] [place], [place], [address] Phone: [phone]   Fax:  [phone]  Name: [name] [name] MRN: [id number] Date of Birth: 09-Jan-1940   Vianne Bulls, OTR/L [facility] [address] [place], [place]  [address] [phone] phone [phone] 07-21-16 4:59 PM

## 2016-07-06 NOTE — Patient Instructions (Addendum)
Try spiriva 2 puffs each am in place of the powder   Work on inhaler technique:  relax and gently blow all the way out then take a nice smooth deep breath back in, triggering the inhaler at same time you start breathing in.  Hold for up to 5 seconds if you can. Blow out thru nose. Rinse and gargle with water when done  Only use your albuterol as a rescue medication to be used if you can't catch your breath by resting or doing a relaxed purse lip breathing pattern.  - The less you use it, the better it will work when you need it. - Ok to use up to 2 puffs  every 4 hours if you must but call for immediate appointment if use goes up over your usual need - Don't leave home without it !!  (think of it like the spare tire for your car)   We will track down your 02 at night but you do not need it during the day at present

## 2016-07-07 NOTE — Assessment & Plan Note (Addendum)
PFT 09/04/2013  FEV1 72%, ratio 68, FVC 77%, no sign BD response, decreased diffusing capacity at 66%.  Decreased Mid flows 53% w/ +BD response.  - PFT's  06/18/2016  FEV1 1.96 (64 % ) ratio 71   p 6 % improvement from saba p ? prior to study with DLCO  63/69 % corrects to 88 % for alv volume   - 07/06/2016  Walked RA x 4 laps @ 185 ft each stopped due to  End of study, nl pace, no desats or sob   Symptoms are markedly disproportionate to objective findings and not clear this is actually much of a  lung problem but pt does appear to have difficult to sort out respiratory symptoms of unknown origin for which  DDX  = almost all start with A and  include Adherence, Ace Inhibitors, Acid Reflux, Active Sinus Disease, Alpha 1 Antitripsin deficiency, Anxiety masquerading as Airways dz,  ABPA,  Allergy(esp in young), Aspiration (esp in elderly), Adverse effects of meds,  Active smokers, A bunch of PE's (a small clot burden can't cause this syndrome unless there is already severe underlying pulm or vascular dz with poor reserve) plus two Bs  = Bronchiectasis and Beta blocker use..and one C= CHF     Adherence is always the initial "prime suspect" and is a multilayered concern that requires a "trust but verify" approach in every patient - starting with knowing how to use medications, especially inhalers, correctly, keeping up with refills and understanding the fundamental difference between maintenance and prns vs those medications only taken for a very short course and then stopped and not refilled.  - - The proper method of use, as well as anticipated side effects, of a metered-dose inhaler are discussed and demonstrated to the patient. Improved effectiveness after extensive coaching during this visit to a level of approximately 50 % from a baseline of 0 % > will need a trust but verify approach here  ? Acid (or non-acid) GERD > always difficult to exclude as up to 75% of pts in some series report no assoc GI/  Heartburn symptoms> rec continue max (24h)  acid suppression and diet restrictions/ reviewed     ? Adverse effects of dpi > try spiriva respimat sample, though since he doesn't really have much copd this may not help and low threshold to d/c altogether  ? Allergy/ asthma > no assoc cough /rhinitis or  noct symptoms to support    ? Anxiety > usually at the bottom of this list of usual suspects but should be much higher on this pt's based on H and P and note already on psychotropics .   ? A bunch of PE's > not likely on eliquis  ? BB effect > very unlikely on zebeta   ? chf >  Echo 04/06/16  Left ventricle: The cavity size was normal. Wall thickness was   normal. Systolic function was normal. The estimated ejection   fraction was in the range of 55% to 60%. Wall motion was normal;   there were no regional wall motion abnormalities. Left   ventricular diastolic function parameters were normal, when   adjusted for age. - Aortic valve: There was mild regurgitation. - Mitral valve: There was mild regurgitation.   Will research results of ono on RA but really nothing else to offer at this point  I had an extended discussion with the patient reviewing all relevant studies completed to date and  lasting 25 minutes of a 40  minute office visit with very complex pt new to me    re  severe non-specific but potentially very serious refractory respiratory symptoms of uncertain and potentially multiple  etiologies.   Each maintenance medication was reviewed in detail including most importantly the difference between maintenance and prns and under what circumstances the prns are to be triggered using an action plan format that is not reflected in the computer generated alphabetically organized AVS.    Please see AVS for specific instructions unique to this office visit that I personally wrote and verbalized to the the pt in detail and then reviewed with pt  by my nurse highlighting any changes in  therapy/plan of care  recommended at today's visit.

## 2016-07-08 ENCOUNTER — Encounter: Payer: Self-pay | Admitting: Family Medicine

## 2016-07-09 ENCOUNTER — Ambulatory Visit: Payer: Medicare Other | Admitting: *Deleted

## 2016-07-09 ENCOUNTER — Encounter: Payer: Self-pay | Admitting: *Deleted

## 2016-07-09 DIAGNOSIS — R41842 Visuospatial deficit: Secondary | ICD-10-CM | POA: Diagnosis not present

## 2016-07-09 DIAGNOSIS — R41841 Cognitive communication deficit: Secondary | ICD-10-CM | POA: Diagnosis not present

## 2016-07-09 DIAGNOSIS — I69318 Other symptoms and signs involving cognitive functions following cerebral infarction: Secondary | ICD-10-CM

## 2016-07-09 NOTE — Therapy (Signed)
[facility] [address] [place], [place], [address] Phone: [phone]   Fax:  [phone]  Speech Language Pathology Evaluation  Patient Details  Name: [name] [name] MRN: [id number] Date of Birth: [birth year] Referring Provider: Dr. Rosalin Hawking  Encounter Date: 07/09/2016      End of Session - 07/09/16 1323    Visit Number 1   Number of Visits 17   Date for SLP Re-Evaluation 09/03/16   SLP Start Time 1110   SLP Stop Time  1150   SLP Time Calculation (min) 40 min   Activity Tolerance Patient tolerated treatment well      Past Medical History:  Diagnosis Date  . Arthritis    s/p TKR  . Bladder cancer (HCC)    [facility] [place] low grade papillary urothileal carcinoma  . BPH (benign prostatic hyperplasia)   . Brugada syndrome    Possible Type II Brugada ECG pattern. No family history of SCD, no syncope, no tachypalpitations.  [place] CAD (coronary artery disease)    a. LHC 1/15 - mid LCx 50 and 60%, proximal RCA 50%  . Chronic diastolic CHF (congestive heart failure) ([place])   . CKD (chronic kidney disease)   . COPD (chronic obstructive pulmonary disease) ([place])    Prior heavy smoker. PFTs (3/11): FVC 87%, FEV1 73%, ratio 0.57, DLCO 75%, TLC 121%. Moderate obstructive defect. PFTs (9/15): Only minimal obstruction, sugPrior heavy smoker. PFTs (3/11): FVC 87%, FEV1 73%, ratio 0.57, DLCO 75%, TLC 121%. Moderate obstructive defect. PFTs (9/15) minimal obstruction, poss asthma component   . Depression    with bipolar tendencies  . DOE (dyspnea on exertion)    a. Myoview 8/09- EF 57%, no ischemia // b. Myoview (3/11) EF 68%, diaphragmatic attenuation, no ischemia //  c Echo (4/11) EF 37-16%, mild diastolic dysfunction, PASP 38 mmHg // d. PFTs 9/15 minimal obstruction  /  e. CT negative for ILD  . GERD (gastroesophageal reflux disease)   . History of Doppler ultrasound    Carotid US 3/11 negative for significant stenosis  //  carotid  US 6/17: Mild bilateral plaque, 1-39% ICA  . History of echocardiogram    a. Echo 12/14 Inferior and distal septal HK, mild LVH, EF 50-55%, mild MR, mild LAE  //  b. Echo 6/17: Mild focal basal septal hypertrophy, EF 60-65%, normal wall motion, grade 1 diastolic dysfunction, mild AI  . HLD (hyperlipidemia)   . Hypertensive heart disease with CHF (congestive heart failure) ([place]) 04/08/2009  . Low testosterone   . Lung nodule    a. CT in 2015 >> PET in 12/15 not sugg of malignancy   . LVH (left ventricular hypertrophy)    a. Echo 12/14 Inferior and distal septal HK, mild LVH, EF 50-55%, mild MR, mild LAE  . Mild dementia   . Mitral regurgitation    Echo (4/11) with PISA ERO 0.3 cm^2 and regurgitant volume 41 mL (moderate MR). Echo (12/14) with mild MR.   . MS (multiple sclerosis) ([place])   . MS (multiple sclerosis) ([place])   . Right bundle branch block   . Stroke ([place])   . Thoracic aortic aneurysm (HCC)    4.1 cm 2017  . Urinary retention with incomplete bladder emptying    receives botox injections and treatment for BPH through [facility]    Past Surgical History:  Procedure Laterality Date  . APPENDECTOMY    . EP IMPLANTABLE DEVICE N/A 06/23/2015   Procedure: Loop Recorder Insertion;  Surgeon: Remo Lipps  Peterson Lombard, MD;  Location: [facility];  Service: Cardiovascular;  Laterality: N/A;  . HEMORRHOID SURGERY    . LEFT HEART CATHETERIZATION WITH CORONARY ANGIOGRAM N/A 01/05/2013   Procedure: LEFT HEART CATHETERIZATION WITH CORONARY ANGIOGRAM;  Surgeon: Larey Dresser, MD;  Location[facility] CATH LAB;  Service: Cardiovascular;  Laterality: N/A;  . ROTATOR CUFF REPAIR    . TEE WITHOUT CARDIOVERSION N/A 06/23/2015   Procedure: TRANSESOPHAGEAL ECHOCARDIOGRAM (TEE);  Surgeon: Fay Records, MD;  Location: [facility] ENDOSCOPY;  Service: Cardiovascular;  Laterality: N/A;  . TONSILLECTOMY    . TOTAL KNEE ARTHROPLASTY Right   . TRANSURETHRAL RESECTION OF PROSTATE      There were no vitals filed for this  visit.      Subjective Assessment - 07/09/16 1307    Subjective "I don't think I need speech therapy"   Patient is accompained by: Family member  wife [name]   Currently in Pain? No/denies            SLP Evaluation [facility] - 07/09/16 1307      SLP Visit Information   SLP Received On 07/09/16   Referring Provider Dr. Rosalin Hawking   Onset Date 06/21/16   Medical Diagnosis multiple CVAs, Multiple Sclerosis     Subjective   Subjective I don't think I need speech therapy   Patient/Family Stated Goal none stated     General Information   HPI 76 year old male referred for outpatient evaluation after acute hospitalization for CVA. Pt has had 3 CVAs, 06/20/15, 04/04/16, 06/21/16. Pt's wife also reports MS. See above for additional history.   Behavioral/Cognition Pt alert and cooperative, hard of hearing   Mobility Status pt ambulatory without device     Prior Functional Status   Cognitive/Linguistic Baseline Baseline deficits   Baseline deficit details history of multiple sclerosis   Type of Home House    Lives With Spouse   Available Support Family;Available 24 hours/day   Education Master's degree in mathematics   Vocation Retired     Pain Assessment   Pain Assessment No/denies pain     Cognition   Overall Cognitive Status History of cognitive impairments - at baseline   Area of Impairment Orientation;Attention;Memory;Safety/judgement; Awareness;Problem solving   Orientation Level Time  pt stated year to be 1918, month as June   Current Attention Level Sustained   Memory Decreased short-term memory   Memory Comments functional difficulty with recall of meds, confusion per wife   Safety/Judgement Decreased awareness of safety;Decreased awareness of deficits   Safety and Judgement Comments wife reports she has removed guns from the house, and hidden car keys   Awareness Intellectual  impaired at this level   Awareness Comments Pt indicated he did not think he needed speech  therapy at the beginning of the session   Problem Solving Slow processing;Difficulty sequencing;Requires verbal cues   Attention Focused;Sustained;Selective;Alternating;Divided   Focused Attention Appears intact   Sustained Attention Impaired   Sustained Attention Impairment Verbal complex;Functional basic;Functional complex   Selective Attention Impaired   Selective Attention Impairment Verbal basic;Verbal complex;Functional basic;Functional complex   Alternating Attention Impaired   Alternating Attention Impairment Verbal basic;Verbal complex;Functional basic;Functional complex   Divided Attention Impaired   Divided Attention Impairment Verbal basic;Verbal complex;Functional basic;Functional complex   Memory Impaired   Memory Impairment Storage deficit;Retrieval deficit;Decreased recall of new information;Decreased short term memory;Prospective memory   Decreased Short Term Memory Verbal basic;Verbal complex;Functional basic;Functional complex   Awareness Impaired   Awareness Impairment Other (comment)  no awareness of cognitive deficits   Problem Solving Impaired   Problem Solving Impairment Verbal basic;Verbal complex;Functional basic;Functional complex   Corporate treasurer;Sequencing;Organizing;Decision Making;Self Monitoring;Self Correcting   Reasoning Impaired   Reasoning Impairment Verbal basic;Verbal complex;Functional basic;Functional complex   Sequencing Impaired   Sequencing Impairment Verbal basic;Verbal complex;Functional basic;Functional complex   Organizing Impaired   Organizing Impairment Verbal basic;Verbal complex;Functional basic;Functional complex   Decision Making Impaired   Decision Making Impairment Verbal basic;Verbal complex;Functional basic;Functional complex   Self Monitoring Impaired   Self Monitoring Impairment Verbal basic;Verbal complex;Functional basic;Functional complex   Self Correcting Impaired   Self Correcting Impairment Verbal  basic;Verbal complex;Functional basic;Functional complex     Auditory Comprehension   Overall Auditory Comprehension Appears within functional limits for tasks assessed     Visual Recognition/Discrimination   Discrimination Within Function Limits     Reading Comprehension   Reading Status Not tested     Expression   Primary Mode of Expression Verbal     Verbal Expression   Overall Verbal Expression Appears within functional limits for tasks assessed     Written Expression   Dominant Hand Right   Written Expression Not tested     Oral Motor/Sensory Function   Overall Oral Motor/Sensory Function Right facial weakness     Motor Speech   Overall Motor Speech Appears within functional limits for tasks assessed     Standardized Assessments   Standardized Assessments  Montreal Cognitive Assessment (MOCA)   Montreal Cognitive Assessment (MOCA)  16     Individuals Consulted   Consulted and Agree with Results and Recommendations Patient;Family member/caregiver   Family Member Consulted  wife [name]          SLP Education - 07/09/16 1323    Education provided Yes   Education Details Information provided information on cognitive activities to do at home.   Person(s) Educated Patient;Spouse   Methods Explanation;Demonstration;Handout   Comprehension Verbalized understanding          SLP Short Term Goals - 07/09/16 1330      SLP SHORT TERM GOAL #1   Title Pt will implement functional system to facilitate recall, using it at least 2x/week between therapies.   Time 4   Period Weeks   Status New     SLP SHORT TERM GOAL #2   Title Pt will verbalize 3 cognitive deficits given min cues   Time 4   Period Weeks   Status New     SLP SHORT TERM GOAL #3   Title Pt will complete simple organization, reasoning, problem solving tasks with 80% accuracy given mod assist   Time 4   Period Weeks   Status New          SLP Long Term Goals - 07/09/16 1333      SLP LONG TERM  GOAL #1   Title Pt/wife will report improved functional recall of appointments, medications, events, activities using identified strategy, recalling 2+ events between therapy sessions with min reminders from wife.   Time 8   Period Weeks   Status New     SLP LONG TERM GOAL #2   Title Pt will verbalize at least 2 compensatory strategies for each deficit identified.    Time 4   Period Weeks   Status New     SLP LONG TERM GOAL #3   Title Pt will complete complex thought organization, reasoning, problem solving tasks with 80% accuracy given min assist.   Time 4   Period Weeks  Status New     SLP LONG TERM GOAL #4   Title Pt will accurately load med organizer without verbal cues   Time 8   Period Weeks   Status New     SLP LONG TERM GOAL #5   Title Pt/wife will report pt to be modified - independent with self medication at home, requiring only supervision during administration   Time 8   Period Weeks          Plan - 07-31-2016 1325    Clinical Impression Statement The Montreal Cognitive Assessment (MoCA) was administered. Pt scored 16/30 (n=26+/30) indicating moderate cognitive impairment for pt level of education (Master's degree in Mathematics). Pt exhibited difficulty with executive function, immediate and delayed recall, thought organization, orientation, calculations, abstract reasoning, and attention. Pt also exhibits poor awareness of deficits. His wife, present during the evaluation, indicates pt demonstrates poor recall of his medications and low energy level. She currently organizes and provides his medications, and has removed guns from the house and hidden his car keys. Pt would benefit from skilled ST intervention to increase functional cognition, safety and independence, and decrease caregiver burden.   Speech Therapy Frequency 2x / week   Duration --  8 weeks   Treatment/Interventions Environmental controls;Cueing hierarchy;SLP instruction and feedback;Compensatory  strategies;Functional tasks;Cognitive reorganization;Patient/family education;Multimodal communcation approach   Potential to Achieve Goals Fair   Potential Considerations Ability to learn/carryover information;Family/community support;Previous level of function;Cooperation/participation level;Severity of impairments   SLP Home Exercise Plan provided cognitive stimulation home activities   Consulted and Agree with Plan of Care Patient;Family member/caregiver   Family Member Consulted wife carolyn      Patient will benefit from skilled therapeutic intervention in order to improve the following deficits and impairments:   Other symptoms and signs involving cognitive functions following cerebral infarction      G-Codes - 07/31/2016 1339    Functional Assessment Tool Used asha noms, skilled clinical judgment, MoCA   Functional Limitations Memory   Memory Current Status [phone]) At least 40 percent but less than 60 percent impaired, limited or restricted   Memory Goal Status ([id number]) At least 1 percent but less than 20 percent impaired, limited or restricted      Problem List Patient Active Problem List   Diagnosis Date Noted  . 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 Acute ischemic stroke ([place]) 06/22/2016  . Stroke (cerebrum) ([place]) 06/21/2016  . Expressive aphasia   . Essential hypertension 05/17/2016  . Family history of stroke 04/06/2016  . Cognitive deficits 04/06/2016  . Cerebrovascular accident (CVA) due to embolism of cerebral artery ([place]) 04/04/2016  . Syncope and collapse   . Dysarthria   . Acute cystitis without hematuria   . History of ischemic right MCA stroke   . Slurred speech 06/20/2015  . AKI (acute kidney injury) ([place]) 06/20/2015  . Thoracic aortic aneurysm ([place])   . Hyperlipidemia 06/05/2015  . Gastroesophageal reflux disease without esophagitis 06/05/2015  . COPD  GOLD 0  09/04/2013  . Lung nodule 09/04/2013  . Cancer ([place])   . Coronary artery disease involving native  coronary artery of native heart without angina pectoris 02/01/2013  . Chronic diastolic CHF (congestive heart failure) ([place]) 01/03/2013  . Depression   . Urinary retention with incomplete bladder emptying   . BPH (benign prostatic hyperplasia)   . Brugada syndrome 08/23/2011  . Hypertensive heart disease with CHF (congestive heart failure) ([place]) 04/08/2009  . Mitral valve disorder 04/08/2009  . CAROTID BRUIT, RIGHT 03/06/2009  .  SHORTNESS OF BREATH 03/06/2009  . OTHER CHEST PAIN 03/06/2009  . Possible Multiple sclerosis ([place]) 10/11/2006   Celia B. Wann, MSP, CCC-SLP  Shonna Chock 07/09/2016, 1:41 PM  [facility] [address] [place], [place], [address] Phone: [phone]   Fax:  [phone]  Name: [name] [name] MRN: [id number] Date of Birth: 09/09/1940

## 2016-07-12 ENCOUNTER — Ambulatory Visit: Payer: Medicare Other | Admitting: Acute Care

## 2016-07-12 ENCOUNTER — Encounter: Payer: Self-pay | Admitting: Family Medicine

## 2016-07-12 ENCOUNTER — Ambulatory Visit (INDEPENDENT_AMBULATORY_CARE_PROVIDER_SITE_OTHER): Payer: Medicare Other | Admitting: Family Medicine

## 2016-07-12 ENCOUNTER — Ambulatory Visit: Payer: Medicare Other

## 2016-07-12 VITALS — BP 130/70 | HR 68 | Temp 97.9°F | Resp 18 | Ht 71.0 in | Wt 212.0 lb

## 2016-07-12 DIAGNOSIS — I639 Cerebral infarction, unspecified: Secondary | ICD-10-CM

## 2016-07-12 DIAGNOSIS — C4491 Basal cell carcinoma of skin, unspecified: Secondary | ICD-10-CM | POA: Diagnosis not present

## 2016-07-12 DIAGNOSIS — D485 Neoplasm of uncertain behavior of skin: Secondary | ICD-10-CM

## 2016-07-12 MED ORDER — TIOTROPIUM BROMIDE MONOHYDRATE 2.5 MCG/ACT IN AERS: 2.0000 | INHALATION_SPRAY | Freq: Every day | RESPIRATORY_TRACT | 3 refills | Status: DC

## 2016-07-12 NOTE — Progress Notes (Signed)
Subjective:    Patient ID: [name] [name], male    DOB: September 04, 1940, 76 y.o.   MRN: [id number]  HPI Patient is here today due to a suspicious lesion that is on the left side of his neck near the clavicle. It is 1.2 cm in diameter. It is a pink violaceous papule that is tender to the touch with small telangiectasias. Papule is bothering the patient is it is tender. Furthermore it seems to be growing. Wife is concerned that he is depressed. She is concerned that his depression is worsening because of his 3 recent strokes despite maximum medical therapy, his decreasing memory, his inability to perform physically as he did in the past, his increasing disability, and his increasing fatigue due to multiple sclerosis. However upon interviewing the patient today, his spirits seem good. He denies any depression. He denies any anhedonia. He denies any suicidal ideation. He denies any insomnia or panic attacks. He does admit to frustration at his current medical comorbidities however he seems to accept the situation with [name] [name] dignity. His biggest concern is his progressive memory loss Past Medical History:  Diagnosis Date  . Arthritis    s/p TKR  . Bladder cancer (HCC)    [facility], [place] low grade papillary urothileal carcinoma  . BPH (benign prostatic hyperplasia)   . Brugada syndrome    Possible Type II Brugada ECG pattern. No family history of SCD, no syncope, no tachypalpitations.  Marland Kitchen CAD (coronary artery disease)    a. LHC 1/15 - mid LCx 50 and 60%, proximal RCA 50%  . Chronic diastolic CHF (congestive heart failure) ([place])   . CKD (chronic kidney disease)   . COPD (chronic obstructive pulmonary disease) ([place])    Prior heavy smoker. PFTs (3/11): FVC 87%, FEV1 73%, ratio 0.57, DLCO 75%, TLC 121%. Moderate obstructive defect. PFTs (9/15): Only minimal obstruction, sugPrior heavy smoker. PFTs (3/11): FVC 87%, FEV1 73%, ratio 0.57, DLCO 75%, TLC 121%. Moderate obstructive defect. PFTs (9/15) minimal  obstruction, poss asthma component   . Depression    with bipolar tendencies  . DOE (dyspnea on exertion)    a. Myoview 8/09- EF 57%, no ischemia // b. Myoview (3/11) EF 68%, diaphragmatic attenuation, no ischemia //  c Echo (4/11) EF 87-56%, mild diastolic dysfunction, PASP 38 mmHg // d. PFTs 9/15 minimal obstruction  /  e. CT negative for ILD  . GERD (gastroesophageal reflux disease)   . History of Doppler ultrasound    Carotid US 3/11 negative for significant stenosis  //  carotid US 6/17: Mild bilateral plaque, 1-39% ICA  . History of echocardiogram    a. Echo 12/14 Inferior and distal septal HK, mild LVH, EF 50-55%, mild MR, mild LAE  //  b. Echo 6/17: Mild focal basal septal hypertrophy, EF 60-65%, normal wall motion, grade 1 diastolic dysfunction, mild AI  . HLD (hyperlipidemia)   . Hypertensive heart disease with CHF (congestive heart failure) ([place]) 04/08/2009  . Low testosterone   . Lung nodule    a. CT in 2015 >> PET in 12/15 not sugg of malignancy   . LVH (left ventricular hypertrophy)    a. Echo 12/14 Inferior and distal septal HK, mild LVH, EF 50-55%, mild MR, mild LAE  . Mild dementia   . Mitral regurgitation    Echo (4/11) with PISA ERO 0.3 cm^2 and regurgitant volume 41 mL (moderate MR). Echo (12/14) with mild MR.   . MS (multiple sclerosis) ([place])   . MS (multiple sclerosis) ([place])   .  Right bundle branch block   . Stroke ([place])   . Thoracic aortic aneurysm (HCC)    4.1 cm 2017  . Urinary retention with incomplete bladder emptying    receives botox injections and treatment for BPH through [facility]   Past Surgical History:  Procedure Laterality Date  . APPENDECTOMY    . EP IMPLANTABLE DEVICE N/A 06/23/2015   Procedure: Loop Recorder Insertion;  Surgeon: Deboraha Sprang, MD;  Location: [facility];  Service: Cardiovascular;  Laterality: N/A;  . HEMORRHOID SURGERY    . LEFT HEART CATHETERIZATION WITH CORONARY ANGIOGRAM N/A 01/05/2013   Procedure: LEFT HEART  CATHETERIZATION WITH CORONARY ANGIOGRAM;  Surgeon: Larey Dresser, MD;  Location: [facility] CATH LAB;  Service: Cardiovascular;  Laterality: N/A;  . ROTATOR CUFF REPAIR    . TEE WITHOUT CARDIOVERSION N/A 06/23/2015   Procedure: TRANSESOPHAGEAL ECHOCARDIOGRAM (TEE);  Surgeon: Fay Records, MD;  Location[facility] ENDOSCOPY;  Service: Cardiovascular;  Laterality: N/A;  . TONSILLECTOMY    . TOTAL KNEE ARTHROPLASTY Right   . TRANSURETHRAL RESECTION OF PROSTATE     Current Outpatient Prescriptions on File Prior to Visit  Medication Sig Dispense Refill  . acetaminophen (TYLENOL) 325 MG tablet Take 650 mg by mouth every 6 (six) hours as needed (for pain or headaches).     Marland Kitchen amantadine (SYMMETREL) 100 MG capsule TAKE 1 CAPSULE BY MOUTH TWICE DAILY WITH BREAKFAST AND LUNCH 180 capsule 2  . apixaban (ELIQUIS) 5 MG TABS tablet Take 1 tablet (5 mg total) by mouth 2 (two) times daily. 180 tablet 3  . atorvastatin (LIPITOR) 40 MG tablet Take 1 tablet (40 mg total) by mouth daily. 90 tablet 3  . bisoprolol (ZEBETA) 10 MG tablet Take 1 tablet (10 mg total) by mouth daily. 90 tablet 3  . cholecalciferol (VITAMIN D) 400 units TABS tablet Take 400 Units by mouth daily.     . Cyanocobalamin (B-12) 1000 MCG CAPS Take 1,000 mcg by mouth daily.     Marland Kitchen doxazosin (CARDURA) 2 MG tablet Take 1 tablet (2 mg total) by mouth daily. 30 tablet 2  . folic acid (FOLVITE) 1 MG tablet Take 1 mg by mouth daily.    . furosemide (LASIX) 40 MG tablet Take 40 mg by mouth daily.    Marland Kitchen HYDROcodone-acetaminophen (NORCO) 10-325 MG tablet Take 1 tablet by mouth every 6 (six) hours as needed for moderate pain or severe pain. 100 tablet 0  . losartan (COZAAR) 50 MG tablet Take 1 tablet (50 mg total) by mouth daily. 90 tablet 4  . MAGNESIUM CITRATE PO Take 0.5-1 Bottles by mouth once as needed (for mild constipation).     . Multiple Vitamins-Minerals (DRY EYE FORMULA) CAPS Place 1-2 drops into both eyes 4 (four) times daily as needed (for dry eyes).    .  OXYGEN Inhale 2 L into the lungs at bedtime.    . pantoprazole (PROTONIX) 40 MG tablet Take 1 tablet (40 mg total) by mouth daily. 90 tablet 3  . PROAIR HFA 108 (90 Base) MCG/ACT inhaler USE 1 TO 2 INHALATIONS EVERY 6 HOURS AS NEEDED FOR WHEEZING OR SHORTNESS OF BREATH 25.5 g 4  . rOPINIRole (REQUIP) 1 MG tablet TAKE 1 TABLET AT BEDTIME 90 tablet 3  . venlafaxine XR (EFFEXOR-XR) 150 MG 24 hr capsule TAKE 1 CAPSULE TWICE A DAY 180 capsule 3   No current facility-administered medications on file prior to visit.    Allergies  Allergen Reactions  . Acetylcholine  Patient was suspected of having "Brugada Syndrome": (BrS) is a genetic condition that results in abnormal electrical activity within the heart, increasing the risk of sudden cardiac death. Those affected may have episodes of passing out.  Marland Kitchen Alcohol-Sulfur [Sulfur] Other (See Comments)    Patient was suspected of having "Brugada Syndrome"  . Amitriptyline Other (See Comments)    Patient was suspected of having "Brugada Syndrome"  . Bupivacaine Other (See Comments)    Patient was suspected of having "Brugada Syndrome"  . Clomipramine Hcl Other (See Comments)    Patient was suspected of having "Brugada Syndrome"  . Cocaine Other (See Comments)    Patient was suspected of having "Brugada Syndrome"  . Desipramine Other (See Comments)    Patient was suspected of having "Brugada Syndrome"  . Ergonovine Other (See Comments)    Patient was suspected of having "Brugada Syndrome"  . Flecainide Other (See Comments)    Patient was suspected of having "Brugada Syndrome"  . Lithium Other (See Comments)    Patient was suspected of having "Brugada Syndrome"  . Loxapine Other (See Comments)    Patient was suspected of having "Brugada Syndrome"  . Nortriptyline Other (See Comments)    Patient was suspected of having "Brugada Syndrome"  . Oxcarbazepine Other (See Comments)    Patient was suspected of having "Brugada Syndrome"  . Procainamide  Other (See Comments)    Patient was suspected of having "Brugada Syndrome"  . Procaine Other (See Comments)    Patient was suspected of having "Brugada Syndrome"  . Propafenone Other (See Comments)    Patient was suspected of having "Brugada Syndrome"  . Propofol Other (See Comments)    Patient was suspected of having "Brugada Syndrome"  . Trifluoperazine Other (See Comments)    Patient was suspected of having "Brugada Syndrome"   Social History   Social History  . Marital status: Married    Spouse name: N/A  . Number of children: N/A  . Years of education: N/A   Occupational History  . retired Retired   Social History Main Topics  . Smoking status: Former Smoker    Packs/day: 2.00    Years: 40.00    Types: Cigarettes    Quit date: 01/04/1993  . Smokeless tobacco: Former Systems developer    Types: Chew  . Alcohol use 15.0 oz/week    25 Cans of beer per week     Comment: 12 pack a week  . Drug use: No  . Sexual activity: Not on file   Other Topics Concern  . Not on file   Social History Narrative   Retired from the [employer] after 20 years   [place] Veteran      Review of Systems  All other systems reviewed and are negative.      Objective:   Physical Exam  Constitutional: He appears well-developed and well-nourished.  Neck: No JVD present.  Cardiovascular: Normal rate, regular rhythm, normal heart sounds and intact distal pulses.   No murmur heard. Pulmonary/Chest: Effort normal and breath sounds normal. No respiratory distress. He has no wheezes. He has no rales.  Abdominal: Soft. Bowel sounds are normal.  Musculoskeletal: He exhibits edema.  Skin: Rash noted.  Vitals reviewed.   See hpi      Assessment & Plan:  Neoplasm of uncertain behavior of skin of neck  Spent more than 20 minutes with the patient interviewing him with his wife present at the present time I see no clinical indication to increase or  change his antidepressant or add to it due to the  risk of polypharmacy and side effects. The lesion on his neck concerns me that it may be a potential neoplasia. Therefore I anesthetized the lesion was 0.1% lidocaine with epinephrine and using sterile technique performed a shave biopsy of the entire lesion and sent to pathology and labeled container. Hemostasis was achieved with Drysol and a Band-Aid.

## 2016-07-12 NOTE — Addendum Note (Signed)
Addended by: Shary Decamp B on: 07/12/2016 03:19 PM   Modules accepted: Orders

## 2016-07-13 ENCOUNTER — Other Ambulatory Visit: Payer: Self-pay | Admitting: *Deleted

## 2016-07-13 ENCOUNTER — Telehealth: Payer: Self-pay | Admitting: Internal Medicine

## 2016-07-13 ENCOUNTER — Ambulatory Visit: Payer: Medicare Other | Admitting: *Deleted

## 2016-07-13 DIAGNOSIS — C680 Malignant neoplasm of urethra: Secondary | ICD-10-CM | POA: Diagnosis not present

## 2016-07-13 DIAGNOSIS — R41841 Cognitive communication deficit: Secondary | ICD-10-CM | POA: Diagnosis not present

## 2016-07-13 DIAGNOSIS — N3942 Incontinence without sensory awareness: Secondary | ICD-10-CM | POA: Diagnosis not present

## 2016-07-13 DIAGNOSIS — N3020 Other chronic cystitis without hematuria: Secondary | ICD-10-CM | POA: Diagnosis not present

## 2016-07-13 DIAGNOSIS — R0600 Dyspnea, unspecified: Secondary | ICD-10-CM

## 2016-07-13 DIAGNOSIS — R41842 Visuospatial deficit: Secondary | ICD-10-CM | POA: Diagnosis not present

## 2016-07-13 DIAGNOSIS — N319 Neuromuscular dysfunction of bladder, unspecified: Secondary | ICD-10-CM | POA: Diagnosis not present

## 2016-07-13 DIAGNOSIS — R338 Other retention of urine: Secondary | ICD-10-CM | POA: Diagnosis not present

## 2016-07-13 DIAGNOSIS — N320 Bladder-neck obstruction: Secondary | ICD-10-CM | POA: Diagnosis not present

## 2016-07-13 DIAGNOSIS — I69318 Other symptoms and signs involving cognitive functions following cerebral infarction: Secondary | ICD-10-CM | POA: Diagnosis not present

## 2016-07-13 MED ORDER — TIOTROPIUM BROMIDE MONOHYDRATE 2.5 MCG/ACT IN AERS: 2.0000 | INHALATION_SPRAY | Freq: Every day | RESPIRATORY_TRACT | 3 refills | Status: DC

## 2016-07-13 NOTE — Therapy (Signed)
[name] [name] [address] [place], [place], [address] Phone: [phone]   Fax:  [phone]  Speech Language Pathology Treatment  Patient Details  Name: [name] [name] MRN: [id number] Date of Birth: January 17, 1940 Referring Provider: Dr. Rosalin [name]  Encounter Date: 07/13/2016      End of Session - 07/13/16 1522    Visit Number 2   Number of Visits 17   Date for SLP Re-Evaluation 09/03/16   SLP Start Time 1315   SLP Stop Time  1400   SLP Time Calculation (min) 45 min   Activity Tolerance Patient tolerated treatment well      Past Medical History:  Diagnosis Date  . Arthritis    s/p TKR  . Bladder cancer (HCC)    [facility], [place] low grade papillary urothileal carcinoma  . BPH (benign prostatic hyperplasia)   . Brugada syndrome    Possible Type II Brugada ECG pattern. No family history of SCD, no syncope, no tachypalpitations.  Marland Kitchen CAD (coronary artery disease)    a. LHC 1/15 - mid LCx 50 and 60%, proximal RCA 50%  . Chronic diastolic CHF (congestive heart failure) ([place])   . CKD (chronic kidney disease)   . COPD (chronic obstructive pulmonary disease) ([place])    Prior heavy smoker. PFTs (3/11): FVC 87%, FEV1 73%, ratio 0.57, DLCO 75%, TLC 121%. Moderate obstructive defect. PFTs (9/15): Only minimal obstruction, sugPrior heavy smoker. PFTs (3/11): FVC 87%, FEV1 73%, ratio 0.57, DLCO 75%, TLC 121%. Moderate obstructive defect. PFTs (9/15) minimal obstruction, poss asthma component   . Depression    with bipolar tendencies  . DOE (dyspnea on exertion)    a. Myoview 8/09- EF 57%, no ischemia // b. Myoview (3/11) EF 68%, diaphragmatic attenuation, no ischemia //  c Echo (4/11) EF 75-64%, mild diastolic dysfunction, PASP 38 mmHg // d. PFTs 9/15 minimal obstruction  /  e. CT negative for ILD  . GERD (gastroesophageal reflux disease)   . History of Doppler ultrasound    Carotid US 3/11 negative for significant stenosis  //  carotid  US 6/17: Mild bilateral plaque, 1-39% ICA  . History of echocardiogram    a. Echo 12/14 Inferior and distal septal HK, mild LVH, EF 50-55%, mild MR, mild LAE  //  b. Echo 6/17: Mild focal basal septal hypertrophy, EF 60-65%, normal wall motion, grade 1 diastolic dysfunction, mild AI  . HLD (hyperlipidemia)   . Hypertensive heart disease with CHF (congestive heart failure) ([place]) 04/08/2009  . Low testosterone   . Lung nodule    a. CT in 2015 >> PET in 12/15 not sugg of malignancy   . LVH (left ventricular hypertrophy)    a. Echo 12/14 Inferior and distal septal HK, mild LVH, EF 50-55%, mild MR, mild LAE  . Mild dementia   . Mitral regurgitation    Echo (4/11) with PISA ERO 0.3 cm^2 and regurgitant volume 41 mL (moderate MR). Echo (12/14) with mild MR.   . MS (multiple sclerosis) ([place])   . MS (multiple sclerosis) ([place])   . Right bundle branch block   . Stroke ([place])   . Thoracic aortic aneurysm (HCC)    4.1 cm 2017  . Urinary retention with incomplete bladder emptying    receives botox injections and treatment for BPH through [facility]    Past Surgical History:  Procedure Laterality Date  . APPENDECTOMY    . EP IMPLANTABLE DEVICE N/A 06/23/2015   Procedure: Loop Recorder Insertion;  Surgeon: Remo Lipps  Peterson Lombard, MD;  Location: [facility];  Service: Cardiovascular;  Laterality: N/A;  . HEMORRHOID SURGERY    . LEFT HEART CATHETERIZATION WITH CORONARY ANGIOGRAM N/A 01/05/2013   Procedure: LEFT HEART CATHETERIZATION WITH CORONARY ANGIOGRAM;  Surgeon: Larey Dresser, MD;  Location: [facility] CATH LAB;  Service: Cardiovascular;  Laterality: N/A;  . ROTATOR CUFF REPAIR    . TEE WITHOUT CARDIOVERSION N/A 06/23/2015   Procedure: TRANSESOPHAGEAL ECHOCARDIOGRAM (TEE);  Surgeon: Fay Records, MD;  Location: [facility] ENDOSCOPY;  Service: Cardiovascular;  Laterality: N/A;  . TONSILLECTOMY    . TOTAL KNEE ARTHROPLASTY Right   . TRANSURETHRAL RESECTION OF PROSTATE      There were no vitals filed for this  visit.      Subjective Assessment - 07/13/16 1513    Subjective Per [name] [name] (wife); He doesn't think he needs therapy.   Patient is accompained by: Family member  wife [name] [name]   Currently in Pain? No/denies               ADULT SLP TREATMENT - 07/13/16 0001      General Information   Behavior/Cognition Alert;Cooperative;Pleasant mood     Treatment Provided   Treatment provided Cognitive-Linquistic     Pain Assessment   Pain Assessment No/denies pain     Cognitive-Linquistic Treatment   Treatment focused on Cognition;Patient/family/caregiver education   Skilled Treatment Skilled ST session focused on review of evaluation results and goals established. Pt's wife voiced concern that pt does not feel that he needs therapy, and that he has no deficits. Wife reported that he has no awareness of his problems. SLP reviewed basic medical history with pt (CVA x3, MS) and explained changes that may occur due to strokes. SLP gently pointed out difficulty with recall and attention (to which pt responded that he is quite easily distracted. SLP provided information regarding high level cognitve processes that he is struggling with, including problem solving (math!) thought organization, and executive functions (including poor awareness of deficits and functional impact). Pt is a Designer, fashion/clothing, so SLP used math analogies to make the point - basics of addition and subtraction (basic cognitive processes) are still intact, but higher levels of calculus and trigonometry (executive functions, problem solving, judgment) are currently impaired due to multiple strokes. Pt verbalized understanding of what SLP and wife were explaining. Pt was encouraged to acquire a calendar/appointment book to carry with him, to identify areas of difficulty encountered at home, and to think of a reward to assist with poor motivation. Pt did report increased difficulty with reading comprehension. At this point, uncertain if it  is true difficulty with comprehension or difficulty with attention and recall/retention. Will assess further next session.      Assessment / Recommendations / Plan   Plan Continue with current plan of care     Progression Toward Goals   Progression toward goals Progressing toward goals          SLP Education - 07/13/16 1521    Education provided Yes   Education Details acquire calendar/appointment book, write down areas of difficulty at home, identify reward to improve motivation   Person(s) Educated Patient;Spouse   Methods Explanation;Demonstration;Handout   Comprehension Verbalized understanding;Returned demonstration;Verbal cues required          SLP Short Term Goals - 07/13/16 1525      SLP SHORT TERM GOAL #1   Title Pt will implement functional system to facilitate recall, using it at least 2x/week between therapies.   Time 4  Period Weeks   Status On-going     SLP SHORT TERM GOAL #2   Title Pt will verbalize 3 cognitive deficits given min cues   Time 4   Period Weeks   Status On-going     SLP SHORT TERM GOAL #3   Title Pt will complete simple organization, reasoning, problem solving tasks with 80% accuracy given mod assist   Time 4   Period Weeks   Status On-going          SLP Long Term Goals - 07/13/16 1525      SLP LONG TERM GOAL #1   Title Pt/wife will report improved functional recall of appointments, medications, events, activities using identified strategy, recalling 2+ events between therapy sessions with min reminders from wife.   Time 8   Period Weeks   Status On-going     SLP LONG TERM GOAL #2   Title Pt will verbalize at least 2 compensatory strategies for each deficit identified.    Time 8   Period Weeks   Status On-going     SLP LONG TERM GOAL #3   Title Pt will complete complex thought organization, reasoning, problem solving tasks with 80% accuracy given min assist.   Time 8   Period Weeks   Status On-going     SLP LONG TERM  GOAL #4   Title Pt will accurately load med organizer without verbal cues   Time 8   Period Weeks   Status On-going     SLP LONG TERM GOAL #5   Title Pt/wife will report pt to be modified - independent with self medication at home, requiring only supervision during administration   Time 8   Period Weeks   Status On-going          Plan - 07/13/16 1523    Clinical Impression Statement Pt having difficulty with awareness of deficits and need for skilled speech therapy, despite encouragement from wife and PCP. Pt receptive to SLP explanation of deficits caused by CVA, and rationale for therapy. Pt would benefit from continued skilled ST intervention to maximize independence and safety, and decreased caregiver burden.   Speech Therapy Frequency 2x / week   Duration --  8 weeks   Treatment/Interventions Environmental controls;Cueing hierarchy;SLP instruction and feedback;Compensatory strategies;Functional tasks;Cognitive reorganization;Patient/family education;Multimodal communcation approach;Internal/external aids   Potential to Achieve Goals Fair   Potential Considerations Ability to learn/carryover information;Family/community support;Previous level of function;Cooperation/participation level;Severity of impairments   SLP Home Exercise Plan homework assignments given   Consulted and Agree with Plan of Care Patient;Family member/caregiver   Family Member Consulted wife [name] [name]      Patient will benefit from skilled therapeutic intervention in order to improve the following deficits and impairments:   Other symptoms and signs involving cognitive functions following cerebral infarction    Problem List Patient Active Problem List   Diagnosis Date Noted  . 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 Acute ischemic stroke ([place]) 06/22/2016  . Stroke (cerebrum) ([place]) 06/21/2016  . Expressive aphasia   . Essential hypertension 05/17/2016  . Family history of stroke 04/06/2016  . Cognitive deficits  04/06/2016  . Cerebrovascular accident (CVA) due to embolism of cerebral artery ([place]) 04/04/2016  . Syncope and collapse   . Dysarthria   . Acute cystitis without hematuria   . History of ischemic right MCA stroke   . Slurred speech 06/20/2015  . AKI (acute kidney injury) ([place]) 06/20/2015  . Thoracic aortic aneurysm ([place])   . Hyperlipidemia 06/05/2015  .  Gastroesophageal reflux disease without esophagitis 06/05/2015  . COPD  GOLD 0  09/04/2013  . Lung nodule 09/04/2013  . Cancer ([place])   . Coronary artery disease involving native coronary artery of native heart without angina pectoris 02/01/2013  . Chronic diastolic CHF (congestive heart failure) ([place]) 01/03/2013  . Depression   . Urinary retention with incomplete bladder emptying   . BPH (benign prostatic hyperplasia)   . Brugada syndrome 08/23/2011  . Hypertensive heart disease with CHF (congestive heart failure) ([place]) 04/08/2009  . Mitral valve disorder 04/08/2009  . CAROTID BRUIT, RIGHT 03/06/2009  . SHORTNESS OF BREATH 03/06/2009  . OTHER CHEST PAIN 03/06/2009  . Possible Multiple sclerosis ([place]) 10/11/2006   [name] [name] B. [place], MSP, CCC-SLP  Shonna Chock 07/13/2016, 3:26 PM  [facility] [address] [place], [place], [address] Phone: [phone]   Fax:  [phone]   Name: [name] [name] MRN: [id number] Date of Birth: 1940/07/19

## 2016-07-13 NOTE — Telephone Encounter (Signed)
Spoke with pt's wife, [name]. She is questioning if the pt needs oxygen at night time. Per his last OV note, MW was going to "track down your 02 at night but you do not need it during the day at present."  MW - please advise if pt needs oxygen at night time. Thanks.

## 2016-07-14 ENCOUNTER — Other Ambulatory Visit: Payer: Self-pay

## 2016-07-14 LAB — PATHOLOGY

## 2016-07-14 NOTE — Telephone Encounter (Signed)
Yes, apologize for the delay and start the 02 as per MR's recs

## 2016-07-14 NOTE — Telephone Encounter (Signed)
ONO results where found in media file. This has been printed out and given to MW. This was done on June 18th so July 18th will be one month since test was done.   Patient's wife [name] is aware. She was upset that the test had to be completed twice due to MR not relaying the results. Per the results I found in Epic, MR suggested that the patient be started on 2L of 02 at night. This was dated 07/01/16 but the order was never placed.   MW, please advise if it is ok for [place] to place the order for 02? Thanks!

## 2016-07-14 NOTE — Telephone Encounter (Signed)
If they know of who/ when this was done we can track it down but if they don't and it's been more than a month it will just need to be repeated on RA to see if ok to stop 02

## 2016-07-14 NOTE — Telephone Encounter (Signed)
Spoke with patient. Advised her that per MW, it was ok to place the order as MR advised back in June. Apologized for the delay. Will place the order now. She verbalized understanding.

## 2016-07-15 ENCOUNTER — Encounter: Payer: Self-pay | Admitting: Family Medicine

## 2016-07-16 ENCOUNTER — Other Ambulatory Visit: Payer: Self-pay

## 2016-07-16 ENCOUNTER — Ambulatory Visit: Payer: Medicare Other | Admitting: *Deleted

## 2016-07-16 DIAGNOSIS — I69318 Other symptoms and signs involving cognitive functions following cerebral infarction: Secondary | ICD-10-CM

## 2016-07-16 DIAGNOSIS — R41841 Cognitive communication deficit: Secondary | ICD-10-CM | POA: Diagnosis not present

## 2016-07-16 DIAGNOSIS — R41842 Visuospatial deficit: Secondary | ICD-10-CM | POA: Diagnosis not present

## 2016-07-16 NOTE — Patient Instructions (Addendum)
  Use appointment book (with pencil) to keep track of appointments, activities, events

## 2016-07-16 NOTE — Therapy (Signed)
[facility] [address] [place], [place], [address] Phone: [phone]   Fax:  [phone]  Speech Language Pathology Treatment  Patient Details  Name: [name] [name] MRN: [id number] Date of Birth: [birth year] Referring Provider: Dr. Rosalin Hawking  Encounter Date: 07/16/2016      End of Session - 07/16/16 1237    Visit Number 3   Number of Visits 17   Date for SLP Re-Evaluation 09/03/16   SLP Start Time 0930   SLP Stop Time  1020   SLP Time Calculation (min) 50 min   Activity Tolerance Patient tolerated treatment well      Past Medical History:  Diagnosis Date  . Arthritis    s/p TKR  . Bladder cancer (HCC)    [facility] [place] low grade papillary urothileal carcinoma  . BPH (benign prostatic hyperplasia)   . Brugada syndrome    Possible Type II Brugada ECG pattern. No family history of SCD, no syncope, no tachypalpitations.  Marland Kitchen CAD (coronary artery disease)    a. LHC 1/15 - mid LCx 50 and 60%, proximal RCA 50%  . Chronic diastolic CHF (congestive heart failure) ([place])   . CKD (chronic kidney disease)   . COPD (chronic obstructive pulmonary disease) ([place])    Prior heavy smoker. PFTs (3/11): FVC 87%, FEV1 73%, ratio 0.57, DLCO 75%, TLC 121%. Moderate obstructive defect. PFTs (9/15): Only minimal obstruction, sugPrior heavy smoker. PFTs (3/11): FVC 87%, FEV1 73%, ratio 0.57, DLCO 75%, TLC 121%. Moderate obstructive defect. PFTs (9/15) minimal obstruction, poss asthma component   . Depression    with bipolar tendencies  . DOE (dyspnea on exertion)    a. Myoview 8/09- EF 57%, no ischemia // b. Myoview (3/11) EF 68%, diaphragmatic attenuation, no ischemia //  c Echo (4/11) EF 09-38%, mild diastolic dysfunction, PASP 38 mmHg // d. PFTs 9/15 minimal obstruction  /  e. CT negative for ILD  . GERD (gastroesophageal reflux disease)   . History of Doppler ultrasound    Carotid US 3/11 negative for significant stenosis  //  carotid  US 6/17: Mild bilateral plaque, 1-39% ICA  . History of echocardiogram    a. Echo 12/14 Inferior and distal septal HK, mild LVH, EF 50-55%, mild MR, mild LAE  //  b. Echo 6/17: Mild focal basal septal hypertrophy, EF 60-65%, normal wall motion, grade 1 diastolic dysfunction, mild AI  . HLD (hyperlipidemia)   . Hypertensive heart disease with CHF (congestive heart failure) ([place]) 04/08/2009  . Low testosterone   . Lung nodule    a. CT in 2015 >> PET in 12/15 not sugg of malignancy   . LVH (left ventricular hypertrophy)    a. Echo 12/14 Inferior and distal septal HK, mild LVH, EF 50-55%, mild MR, mild LAE  . Mild dementia   . Mitral regurgitation    Echo (4/11) with PISA ERO 0.3 cm^2 and regurgitant volume 41 mL (moderate MR). Echo (12/14) with mild MR.   . MS (multiple sclerosis) ([place])   . MS (multiple sclerosis) ([place])   . Right bundle branch block   . Stroke ([place])   . Thoracic aortic aneurysm (HCC)    4.1 cm 2017  . Urinary retention with incomplete bladder emptying    receives botox injections and treatment for BPH through [facility]    Past Surgical History:  Procedure Laterality Date  . APPENDECTOMY    . EP IMPLANTABLE DEVICE N/A 06/23/2015   Procedure: Loop Recorder Insertion;  Surgeon: Remo Lipps  Peterson Lombard, MD;  Location: [facility];  Service: Cardiovascular;  Laterality: N/A;  . HEMORRHOID SURGERY    . LEFT HEART CATHETERIZATION WITH CORONARY ANGIOGRAM N/A 01/05/2013   Procedure: LEFT HEART CATHETERIZATION WITH CORONARY ANGIOGRAM;  Surgeon: Larey Dresser, MD;  Location: [facility] CATH LAB;  Service: Cardiovascular;  Laterality: N/A;  . ROTATOR CUFF REPAIR    . TEE WITHOUT CARDIOVERSION N/A 06/23/2015   Procedure: TRANSESOPHAGEAL ECHOCARDIOGRAM (TEE);  Surgeon: Fay Records, MD;  Location: [facility] ENDOSCOPY;  Service: Cardiovascular;  Laterality: N/A;  . TONSILLECTOMY    . TOTAL KNEE ARTHROPLASTY Right   . TRANSURETHRAL RESECTION OF PROSTATE      There were no vitals filed for this  visit.      Subjective Assessment - 07/16/16 0937    Subjective I got a book but we left it in the car   Patient is accompained by: Family member  wife [name]   Currently in Pain? No/denies               ADULT SLP TREATMENT - 07/16/16 0001      General Information   Behavior/Cognition Alert;Cooperative;Pleasant mood     Treatment Provided   Treatment provided Cognitive-Linquistic     Pain Assessment   Pain Assessment No/denies pain     Cognitive-Linquistic Treatment   Treatment focused on Cognition;Patient/family/caregiver education   Skilled Treatment Skilled ST session targeted on completion of clock drawing tasks (10/10), and completion of RCBA given pt report of difficulty with reading comprehension (determining if difficulty related to comprehension or attention/memory, given bilateral CVAs). Pt exhibits mild difficulty with comprehension, and required a significant amount of time to answer the questions. Pt was encouraged to read something light and have [name] ask questions about it.     Assessment / Recommendations / Plan   Plan Continue with current plan of care     Progression Toward Goals   Progression toward goals Progressing toward goals          SLP Education - 07/16/16 1236    Education provided Yes   Education Details choose something easy to read (magazine), read and have [name] ask questions about it.   Person(s) Educated Spouse;Patient   Methods Explanation;Demonstration   Comprehension Verbalized understanding;Returned demonstration;Verbal cues required          SLP Short Term Goals - 07/16/16 1242      SLP SHORT TERM GOAL #1   Title Pt will implement functional system to facilitate recall, using it at least 2x/week between therapies.   Time 4   Period Weeks   Status On-going     SLP SHORT TERM GOAL #2   Title Pt will verbalize 3 cognitive deficits given min cues   Time 4   Period Weeks   Status On-going     SLP SHORT TERM  GOAL #3   Title Pt will complete simple organization, reasoning, problem solving tasks with 80% accuracy given mod assist   Time 4   Period Weeks   Status On-going          SLP Long Term Goals - 07/16/16 1243      SLP LONG TERM GOAL #1   Title Pt/wife will report improved functional recall of appointments, medications, events, activities using identified strategy, recalling 2+ events between therapy sessions with min reminders from wife.   Period Weeks   Status On-going     SLP LONG TERM GOAL #2   Title Pt will verbalize at least  2 compensatory strategies for each deficit identified.    Time 8   Period Weeks   Status On-going     SLP LONG TERM GOAL #3   Title Pt will complete complex thought organization, reasoning, problem solving tasks with 80% accuracy given min assist.   Time 8   Period Weeks   Status On-going     SLP LONG TERM GOAL #4   Title Pt will accurately load med organizer without verbal cues   Time 8   Period Weeks   Status On-going     SLP LONG TERM GOAL #5   Title Pt/wife will report pt to be modified - independent with self medication at home, requiring only supervision during administration   Time 8   Period Weeks   Status On-going          Plan - 07/16/16 1240    Clinical Impression Statement Pt performance on RCBA indicates mild difficulty with comprehension, and significantly extended processing time. Will complete assessment next visit. Continued ST intervention is recommended to maximize cognition, increase safety, and decrease caregiver burden.   Speech Therapy Frequency 2x / week   Duration --  8 weeks   Treatment/Interventions Environmental controls;Cueing hierarchy;SLP instruction and feedback;Compensatory strategies;Functional tasks;Cognitive reorganization;Patient/family education;Multimodal communcation approach;Internal/external aids   Potential to Achieve Goals Fair   Potential Considerations Ability to learn/carryover  information;Family/community support;Previous level of function;Cooperation/participation level;Severity of impairments   SLP Home Exercise Plan homework assignments given   Consulted and Agree with Plan of Care Patient;Family member/caregiver   Family Member Consulted wife carolyn      Patient will benefit from skilled therapeutic intervention in order to improve the following deficits and impairments:   Other symptoms and signs involving cognitive functions following cerebral infarction    Problem List Patient Active Problem List   Diagnosis Date Noted  . 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 Acute ischemic stroke ([place]) 06/22/2016  . Stroke (cerebrum) ([place]) 06/21/2016  . Expressive aphasia   . Essential hypertension 05/17/2016  . Family history of stroke 04/06/2016  . Cognitive deficits 04/06/2016  . Cerebrovascular accident (CVA) due to embolism of cerebral artery ([place]) 04/04/2016  . Syncope and collapse   . Dysarthria   . Acute cystitis without hematuria   . History of ischemic right MCA stroke   . Slurred speech 06/20/2015  . AKI (acute kidney injury) ([place]) 06/20/2015  . Thoracic aortic aneurysm ([place])   . Hyperlipidemia 06/05/2015  . Gastroesophageal reflux disease without esophagitis 06/05/2015  . COPD  GOLD 0  09/04/2013  . Lung nodule 09/04/2013  . Cancer ([place])   . Coronary artery disease involving native coronary artery of native heart without angina pectoris 02/01/2013  . Chronic diastolic CHF (congestive heart failure) ([place]) 01/03/2013  . Depression   . Urinary retention with incomplete bladder emptying   . BPH (benign prostatic hyperplasia)   . Brugada syndrome 08/23/2011  . Hypertensive heart disease with CHF (congestive heart failure) ([place]) 04/08/2009  . Mitral valve disorder 04/08/2009  . CAROTID BRUIT, RIGHT 03/06/2009  . SHORTNESS OF BREATH 03/06/2009  . OTHER CHEST PAIN 03/06/2009  . Possible Multiple sclerosis ([place]) 10/11/2006   Hollister Wessler B. Craig, [place],  CCC-SLP  Shonna Chock 07/16/2016, 12:44 PM  [facility] [address] [place], [place], [address] Phone: [phone]   Fax:  [phone]   Name: MAYUR DUMAN MRN: [id number] Date of Birth: 23-Aug-1940

## 2016-07-19 ENCOUNTER — Other Ambulatory Visit: Payer: Self-pay

## 2016-07-19 NOTE — Patient Outreach (Signed)
Telephone outreach to patient to obtain mRs was successfully completed. mRs= 3. 

## 2016-07-20 ENCOUNTER — Ambulatory Visit: Payer: Medicare Other | Admitting: *Deleted

## 2016-07-20 ENCOUNTER — Ambulatory Visit (INDEPENDENT_AMBULATORY_CARE_PROVIDER_SITE_OTHER): Payer: Medicare Other | Admitting: Family Medicine

## 2016-07-20 VITALS — BP 120/60 | HR 78 | Temp 97.9°F | Resp 16 | Ht 71.0 in | Wt 216.0 lb

## 2016-07-20 DIAGNOSIS — C4441 Basal cell carcinoma of skin of scalp and neck: Secondary | ICD-10-CM | POA: Diagnosis not present

## 2016-07-20 DIAGNOSIS — R41841 Cognitive communication deficit: Secondary | ICD-10-CM | POA: Diagnosis not present

## 2016-07-20 DIAGNOSIS — R41842 Visuospatial deficit: Secondary | ICD-10-CM | POA: Diagnosis not present

## 2016-07-20 DIAGNOSIS — C4491 Basal cell carcinoma of skin, unspecified: Secondary | ICD-10-CM | POA: Diagnosis not present

## 2016-07-20 DIAGNOSIS — I69318 Other symptoms and signs involving cognitive functions following cerebral infarction: Secondary | ICD-10-CM | POA: Diagnosis not present

## 2016-07-20 NOTE — Progress Notes (Signed)
Subjective:    Patient ID: [name] [name], male    DOB: [birth year], 76 y.o.   MRN: [id number]  HPI  07/12/16 Patient is here today due to a suspicious lesion that is on the left side of his neck near the clavicle. It is 1.2 cm in diameter. It is a pink violaceous papule that is tender to the touch with small telangiectasias. Papule is bothering the patient is it is tender. Furthermore it seems to be growing. Wife is concerned that he is depressed. She is concerned that his depression is worsening because of his 3 recent strokes despite maximum medical therapy, his decreasing memory, his inability to perform physically as he did in the past, his increasing disability, and his increasing fatigue due to multiple sclerosis. However upon interviewing the patient today, his spirits seem good. He denies any depression. He denies any anhedonia. He denies any suicidal ideation. He denies any insomnia or panic attacks. He does admit to frustration at his current medical comorbidities however he seems to accept the situation with [name] dignity. His biggest concern is his progressive memory loss.  At that time, my plan was: Spent more than 20 minutes with the patient interviewing him with his wife present at the present time I see no clinical indication to increase or change his antidepressant or add to it due to the risk of polypharmacy and side effects. The lesion on his neck concerns me that it may be a potential neoplasia. Therefore I anesthetized the lesion was 0.1% lidocaine with epinephrine and using sterile technique performed a shave biopsy of the entire lesion and sent to pathology and labeled container. Hemostasis was achieved with Drysol and a Band-Aid.  07/20/16 Biopsy returned basal cell carcinoma with a deep margins involved. Patient is here today for repeat excision.   Past Medical History:  Diagnosis Date  . Arthritis    s/p TKR  . Bladder cancer (HCC)    [facility], [place] low grade papillary  urothileal carcinoma  . BPH (benign prostatic hyperplasia)   . Brugada syndrome    Possible Type II Brugada ECG pattern. No family history of SCD, no syncope, no tachypalpitations.  Marland Kitchen CAD (coronary artery disease)    a. LHC 1/15 - mid LCx 50 and 60%, proximal RCA 50%  . Chronic diastolic CHF (congestive heart failure) ([place])   . CKD (chronic kidney disease)   . COPD (chronic obstructive pulmonary disease) ([place])    Prior heavy smoker. PFTs (3/11): FVC 87%, FEV1 73%, ratio 0.57, DLCO 75%, TLC 121%. Moderate obstructive defect. PFTs (9/15): Only minimal obstruction, sugPrior heavy smoker. PFTs (3/11): FVC 87%, FEV1 73%, ratio 0.57, DLCO 75%, TLC 121%. Moderate obstructive defect. PFTs (9/15) minimal obstruction, poss asthma component   . Depression    with bipolar tendencies  . DOE (dyspnea on exertion)    a. Myoview 8/09- EF 57%, no ischemia // b. Myoview (3/11) EF 68%, diaphragmatic attenuation, no ischemia //  c Echo (4/11) EF 00-93%, mild diastolic dysfunction, PASP 38 mmHg // d. PFTs 9/15 minimal obstruction  /  e. CT negative for ILD  . GERD (gastroesophageal reflux disease)   . History of Doppler ultrasound    Carotid US 3/11 negative for significant stenosis  //  carotid US 6/17: Mild bilateral plaque, 1-39% ICA  . History of echocardiogram    a. Echo 12/14 Inferior and distal septal HK, mild LVH, EF 50-55%, mild MR, mild LAE  //  b. Echo 6/17: Mild focal basal septal hypertrophy,  EF 60-65%, normal wall motion, grade 1 diastolic dysfunction, mild AI  . HLD (hyperlipidemia)   . Hypertensive heart disease with CHF (congestive heart failure) ([place]) 04/08/2009  . Low testosterone   . Lung nodule    a. CT in 2015 >> PET in 12/15 not sugg of malignancy   . LVH (left ventricular hypertrophy)    a. Echo 12/14 Inferior and distal septal HK, mild LVH, EF 50-55%, mild MR, mild LAE  . Mild dementia   . Mitral regurgitation    Echo (4/11) with PISA ERO 0.3 cm^2 and regurgitant volume 41 mL  (moderate MR). Echo (12/14) with mild MR.   . MS (multiple sclerosis) ([place])   . MS (multiple sclerosis) ([place])   . Right bundle branch block   . Stroke ([place])   . Thoracic aortic aneurysm (HCC)    4.1 cm 2017  . Urinary retention with incomplete bladder emptying    receives botox injections and treatment for BPH through [facility]   Past Surgical History:  Procedure Laterality Date  . APPENDECTOMY    . EP IMPLANTABLE DEVICE N/A 06/23/2015   Procedure: Loop Recorder Insertion;  Surgeon: Deboraha Sprang, MD;  Location: [facility];  Service: Cardiovascular;  Laterality: N/A;  . HEMORRHOID SURGERY    . LEFT HEART CATHETERIZATION WITH CORONARY ANGIOGRAM N/A 01/05/2013   Procedure: LEFT HEART CATHETERIZATION WITH CORONARY ANGIOGRAM;  Surgeon: Larey Dresser, MD;  Location: [facility] CATH LAB;  Service: Cardiovascular;  Laterality: N/A;  . ROTATOR CUFF REPAIR    . TEE WITHOUT CARDIOVERSION N/A 06/23/2015   Procedure: TRANSESOPHAGEAL ECHOCARDIOGRAM (TEE);  Surgeon: Fay Records, MD;  Location: [facility] ENDOSCOPY;  Service: Cardiovascular;  Laterality: N/A;  . TONSILLECTOMY    . TOTAL KNEE ARTHROPLASTY Right   . TRANSURETHRAL RESECTION OF PROSTATE     Current Outpatient Prescriptions on File Prior to Visit  Medication Sig Dispense Refill  . acetaminophen (TYLENOL) 325 MG tablet Take 650 mg by mouth every 6 (six) hours as needed (for pain or headaches).     Marland Kitchen amantadine (SYMMETREL) 100 MG capsule TAKE 1 CAPSULE BY MOUTH TWICE DAILY WITH BREAKFAST AND LUNCH 180 capsule 2  . apixaban (ELIQUIS) 5 MG TABS tablet Take 1 tablet (5 mg total) by mouth 2 (two) times daily. 180 tablet 3  . atorvastatin (LIPITOR) 40 MG tablet Take 1 tablet (40 mg total) by mouth daily. 90 tablet 3  . bisoprolol (ZEBETA) 10 MG tablet Take 1 tablet (10 mg total) by mouth daily. 90 tablet 3  . cholecalciferol (VITAMIN D) 400 units TABS tablet Take 400 Units by mouth daily.     . Cyanocobalamin (B-12) 1000 MCG CAPS Take 1,000 mcg by mouth  daily.     Marland Kitchen doxazosin (CARDURA) 2 MG tablet Take 1 tablet (2 mg total) by mouth daily. 30 tablet 2  . folic acid (FOLVITE) 1 MG tablet Take 1 mg by mouth daily.    . furosemide (LASIX) 40 MG tablet Take 40 mg by mouth daily.    Marland Kitchen HYDROcodone-acetaminophen (NORCO) 10-325 MG tablet Take 1 tablet by mouth every 6 (six) hours as needed for moderate pain or severe pain. 100 tablet 0  . losartan (COZAAR) 50 MG tablet Take 1 tablet (50 mg total) by mouth daily. 90 tablet 4  . MAGNESIUM CITRATE PO Take 0.5-1 Bottles by mouth once as needed (for mild constipation).     . Multiple Vitamins-Minerals (DRY EYE FORMULA) CAPS Place 1-2 drops into both eyes 4 (four)  times daily as needed (for dry eyes).    . OXYGEN Inhale 2 L into the lungs at bedtime.    . pantoprazole (PROTONIX) 40 MG tablet Take 1 tablet (40 mg total) by mouth daily. 90 tablet 3  . PROAIR HFA 108 (90 Base) MCG/ACT inhaler USE 1 TO 2 INHALATIONS EVERY 6 HOURS AS NEEDED FOR WHEEZING OR SHORTNESS OF BREATH 25.5 g 4  . rOPINIRole (REQUIP) 1 MG tablet TAKE 1 TABLET AT BEDTIME 90 tablet 3  . Tiotropium Bromide Monohydrate (SPIRIVA RESPIMAT) 2.5 MCG/ACT AERS Inhale 2 puffs into the lungs daily. 3 Inhaler 3  . venlafaxine XR (EFFEXOR-XR) 150 MG 24 hr capsule TAKE 1 CAPSULE TWICE A DAY 180 capsule 3   No current facility-administered medications on file prior to visit.    Allergies  Allergen Reactions  . Acetylcholine     Patient was suspected of having "Brugada Syndrome": (BrS) is a genetic condition that results in abnormal electrical activity within the heart, increasing the risk of sudden cardiac death. Those affected may have episodes of passing out.  Marland Kitchen Alcohol-Sulfur [Sulfur] Other (See Comments)    Patient was suspected of having "Brugada Syndrome"  . Amitriptyline Other (See Comments)    Patient was suspected of having "Brugada Syndrome"  . Bupivacaine Other (See Comments)    Patient was suspected of having "Brugada Syndrome"  .  Clomipramine Hcl Other (See Comments)    Patient was suspected of having "Brugada Syndrome"  . Cocaine Other (See Comments)    Patient was suspected of having "Brugada Syndrome"  . Desipramine Other (See Comments)    Patient was suspected of having "Brugada Syndrome"  . Ergonovine Other (See Comments)    Patient was suspected of having "Brugada Syndrome"  . Flecainide Other (See Comments)    Patient was suspected of having "Brugada Syndrome"  . Lithium Other (See Comments)    Patient was suspected of having "Brugada Syndrome"  . Loxapine Other (See Comments)    Patient was suspected of having "Brugada Syndrome"  . Nortriptyline Other (See Comments)    Patient was suspected of having "Brugada Syndrome"  . Oxcarbazepine Other (See Comments)    Patient was suspected of having "Brugada Syndrome"  . Procainamide Other (See Comments)    Patient was suspected of having "Brugada Syndrome"  . Procaine Other (See Comments)    Patient was suspected of having "Brugada Syndrome"  . Propafenone Other (See Comments)    Patient was suspected of having "Brugada Syndrome"  . Propofol Other (See Comments)    Patient was suspected of having "Brugada Syndrome"  . Trifluoperazine Other (See Comments)    Patient was suspected of having "Brugada Syndrome"   Social History   Social History  . Marital status: Married    Spouse name: N/A  . Number of children: N/A  . Years of education: N/A   Occupational History  . retired Retired   Social History Main Topics  . Smoking status: Former Smoker    Packs/day: 2.00    Years: 40.00    Types: Cigarettes    Quit date: 01/04/1993  . Smokeless tobacco: Former Systems developer    Types: Chew  . Alcohol use 15.0 oz/week    25 Cans of beer per week     Comment: 12 pack a week  . Drug use: No  . Sexual activity: Not on file   Other Topics Concern  . Not on file   Social History Narrative   Retired from the [employer]  after 20 years   [place] Veteran       Review of Systems  All other systems reviewed and are negative.      Objective:   Physical Exam  Constitutional: He appears well-developed and well-nourished.  Cardiovascular: Normal rate, regular rhythm, normal heart sounds and intact distal pulses.   No murmur heard. Pulmonary/Chest: Effort normal and breath sounds normal. No respiratory distress. He has no wheezes. He has no rales.  Musculoskeletal: He exhibits no edema.  Skin: No rash noted.  Vitals reviewed.         Assessment & Plan:  Basal cell carcinoma (BCC) of skin of neck - Plan: Pathology Patient was brought back to the procedure room. The area of the previous biopsy on the left side of his neck just above his clavicle was identified. It was anesthetized with 0.1% lidocaine with epinephrine and prepped and draped in sterile fashion. A 3.5 x 4 cm elliptical excision was made around the entire lesion with clear margins down to the hypodermis. The skin edges were  Approximated with 5 simple interrupted 3-0 ethilon sutures.  Wound care was discussed. Lesion was sent to pathology and labeled container to ensure clear margins. Stitches out in one week

## 2016-07-20 NOTE — Therapy (Signed)
[facility] [address] [place], [place], [address] Phone: [phone]   Fax:  [phone]  Speech Language Pathology Treatment  Patient Details  Name: [name] [name] MRN: [id number] Date of Birth: July 02, 1940 Referring Provider: Dr. Rosalin Hawking  Encounter Date: 07/20/2016      End of Session - 07/20/16 1543    Visit Number 4   Number of Visits 17   Date for SLP Re-Evaluation 09/03/16   SLP Start Time 1025   SLP Stop Time  1400   SLP Time Calculation (min) 45 min   Activity Tolerance Patient tolerated treatment well      Past Medical History:  Diagnosis Date  . Arthritis    s/p TKR  . Bladder cancer (HCC)    [facility], [place] low grade papillary urothileal carcinoma  . BPH (benign prostatic hyperplasia)   . Brugada syndrome    Possible Type II Brugada ECG pattern. No family history of SCD, no syncope, no tachypalpitations.  Marland Kitchen CAD (coronary artery disease)    a. LHC 1/15 - mid LCx 50 and 60%, proximal RCA 50%  . Chronic diastolic CHF (congestive heart failure) ([place])   . CKD (chronic kidney disease)   . COPD (chronic obstructive pulmonary disease) ([place])    Prior heavy smoker. PFTs (3/11): FVC 87%, FEV1 73%, ratio 0.57, DLCO 75%, TLC 121%. Moderate obstructive defect. PFTs (9/15): Only minimal obstruction, sugPrior heavy smoker. PFTs (3/11): FVC 87%, FEV1 73%, ratio 0.57, DLCO 75%, TLC 121%. Moderate obstructive defect. PFTs (9/15) minimal obstruction, poss asthma component   . Depression    with bipolar tendencies  . DOE (dyspnea on exertion)    a. Myoview 8/09- EF 57%, no ischemia // b. Myoview (3/11) EF 68%, diaphragmatic attenuation, no ischemia //  c Echo (4/11) EF 85-27%, mild diastolic dysfunction, PASP 38 mmHg // d. PFTs 9/15 minimal obstruction  /  e. CT negative for ILD  . GERD (gastroesophageal reflux disease)   . History of Doppler ultrasound    Carotid US 3/11 negative for significant stenosis  //  carotid  US 6/17: Mild bilateral plaque, 1-39% ICA  . History of echocardiogram    a. Echo 12/14 Inferior and distal septal HK, mild LVH, EF 50-55%, mild MR, mild LAE  //  b. Echo 6/17: Mild focal basal septal hypertrophy, EF 60-65%, normal wall motion, grade 1 diastolic dysfunction, mild AI  . HLD (hyperlipidemia)   . Hypertensive heart disease with CHF (congestive heart failure) ([place]) 04/08/2009  . Low testosterone   . Lung nodule    a. CT in 2015 >> PET in 12/15 not sugg of malignancy   . LVH (left ventricular hypertrophy)    a. Echo 12/14 Inferior and distal septal HK, mild LVH, EF 50-55%, mild MR, mild LAE  . Mild dementia   . Mitral regurgitation    Echo (4/11) with PISA ERO 0.3 cm^2 and regurgitant volume 41 mL (moderate MR). Echo (12/14) with mild MR.   . MS (multiple sclerosis) ([place])   . MS (multiple sclerosis) ([place])   . Right bundle branch block   . Stroke ([place])   . Thoracic aortic aneurysm (HCC)    4.1 cm 2017  . Urinary retention with incomplete bladder emptying    receives botox injections and treatment for BPH through [facility]    Past Surgical History:  Procedure Laterality Date  . APPENDECTOMY    . EP IMPLANTABLE DEVICE N/A 06/23/2015   Procedure: Loop Recorder Insertion;  Surgeon: Remo Lipps  Peterson Lombard, MD;  Location: [facility] CV LAB;  Service: Cardiovascular;  Laterality: N/A;  . HEMORRHOID SURGERY    . LEFT HEART CATHETERIZATION WITH CORONARY ANGIOGRAM N/A 01/05/2013   Procedure: LEFT HEART CATHETERIZATION WITH CORONARY ANGIOGRAM;  Surgeon: Larey Dresser, MD;  Location: [facility] CATH LAB;  Service: Cardiovascular;  Laterality: N/A;  . ROTATOR CUFF REPAIR    . TEE WITHOUT CARDIOVERSION N/A 06/23/2015   Procedure: TRANSESOPHAGEAL ECHOCARDIOGRAM (TEE);  Surgeon: Fay Records, MD;  Location: [facility] ENDOSCOPY;  Service: Cardiovascular;  Laterality: N/A;  . TONSILLECTOMY    . TOTAL KNEE ARTHROPLASTY Right   . TRANSURETHRAL RESECTION OF PROSTATE      There were no vitals filed for this  visit.      Subjective Assessment - 07/20/16 1309    Subjective I have no motivation to do anything   Patient is accompained by: Family member  [name]   Currently in Pain? No/denies               ADULT SLP TREATMENT - 07/20/16 0001      General Information   Behavior/Cognition Alert;Cooperative;Pleasant mood     Treatment Provided   Treatment provided Cognitive-Linquistic     Pain Assessment   Pain Assessment No/denies pain     Cognitive-Linquistic Treatment   Treatment focused on Cognition;Patient/family/caregiver education   Skilled Treatment Skilled ST session focused on increasing awareness of cognitive deficits and establishing strategies to compensate for them. Pt verbalizes that motivation/initiation is his primary issue. To compensate for this executive function deficit, pt, wife, and SLP discussed establishing a schedule for going to the gym, using the calendar to keep track of appointments, write home to do lists, complete cognitive activities online at home every day, complete pulmonary rehab exercises 2-3x/week, and remember to reward himself when he is motivated and completes a task. Wife was encouraged to be pt's accountability partner. Pt seemed to have more difficulty with word finding today. He was atempting to describe his work out routine, and could not name or describe several parts of his routine.      Assessment / Recommendations / Plan   Plan Continue with current plan of care     Progression Toward Goals   Progression toward goals Progressing toward goals          SLP Education - 07/20/16 1543    Education provided Yes   Education Details establishing routinues, rewarding motivation   Person(s) Educated Patient;Spouse   Methods Explanation;Demonstration;Handout;Verbal cues   Comprehension Verbalized understanding;Need further instruction          SLP Short Term Goals - 07/20/16 1546      SLP SHORT TERM GOAL #1   Title Pt will  implement functional system to facilitate recall, using it at least 2x/week between therapies.   Time 3   Period Weeks   Status On-going     SLP SHORT TERM GOAL #2   Title Pt will verbalize 3 cognitive deficits given min cues   Time 3   Period Weeks   Status On-going          SLP Long Term Goals - 07/20/16 1546      SLP LONG TERM GOAL #1   Title Pt/wife will report improved functional recall of appointments, medications, events, activities using identified strategy, recalling 2+ events between therapy sessions with min reminders from wife.   Time 7   Period Weeks   Status On-going     SLP LONG TERM GOAL #2  Title Pt will verbalize at least 2 compensatory strategies for each deficit identified.    Time 7   Period Weeks   Status On-going     SLP LONG TERM GOAL #3   Title Pt will complete complex thought organization, reasoning, problem solving tasks with 80% accuracy given min assist.   Time 7   Period Weeks   Status On-going     SLP LONG TERM GOAL #4   Title Pt will accurately load med organizer without verbal cues   Time 7   Period Weeks   Status On-going     SLP LONG TERM GOAL #5   Title Pt/wife will report pt to be modified - independent with self medication at home, requiring only supervision during administration   Time 7   Period Weeks   Status On-going          Plan - 07/20/16 1544    Clinical Impression Statement Pt with increased intellectual awareness, verbalizing difficulty with executive functions of motivation/initiation and planning. Continued ST intervention is recommended to maximize cognition for safety and independence, as well as to decrease caregier burden.   Speech Therapy Frequency 2x / week   Duration --  8 weeks   Treatment/Interventions Environmental controls;Cueing hierarchy;SLP instruction and feedback;Compensatory strategies;Functional tasks;Cognitive reorganization;Patient/family education;Multimodal communcation  approach;Internal/external aids   Potential to Achieve Goals Fair   Potential Considerations Ability to learn/carryover information;Family/community support;Previous level of function;Cooperation/participation level;Severity of impairments   SLP Home Exercise Plan homework assignments given   Consulted and Agree with Plan of Care Patient;Family member/caregiver   Family Member Consulted wife carolyn      Patient will benefit from skilled therapeutic intervention in order to improve the following deficits and impairments:   Cognitive communication deficit    Problem List Patient Active Problem List   Diagnosis Date Noted  . 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 Acute ischemic stroke ([place]) 06/22/2016  . Stroke (cerebrum) ([place]) 06/21/2016  . Expressive aphasia   . Essential hypertension 05/17/2016  . Family history of stroke 04/06/2016  . Cognitive deficits 04/06/2016  . Cerebrovascular accident (CVA) due to embolism of cerebral artery ([place]) 04/04/2016  . Syncope and collapse   . Dysarthria   . Acute cystitis without hematuria   . History of ischemic right MCA stroke   . Slurred speech 06/20/2015  . AKI (acute kidney injury) ([place]) 06/20/2015  . Thoracic aortic aneurysm ([place])   . Hyperlipidemia 06/05/2015  . Gastroesophageal reflux disease without esophagitis 06/05/2015  . COPD  GOLD 0  09/04/2013  . Lung nodule 09/04/2013  . Cancer ([place])   . Coronary artery disease involving native coronary artery of native heart without angina pectoris 02/01/2013  . Chronic diastolic CHF (congestive heart failure) ([place]) 01/03/2013  . Depression   . Urinary retention with incomplete bladder emptying   . BPH (benign prostatic hyperplasia)   . Brugada syndrome 08/23/2011  . Hypertensive heart disease with CHF (congestive heart failure) ([place]) 04/08/2009  . Mitral valve disorder 04/08/2009  . CAROTID BRUIT, RIGHT 03/06/2009  . SHORTNESS OF BREATH 03/06/2009  . OTHER CHEST PAIN 03/06/2009  . Possible  Multiple sclerosis ([place]) 10/11/2006   Celia B. Westwood, MSP, CCC-SLP  Shonna Chock 07/20/2016, 3:47 PM  [facility] [address] [place], [place], [address] Phone: [phone]   Fax:  [phone]   Name: MORGON PAMER MRN: [id number] Date of Birth: 1940-11-26

## 2016-07-20 NOTE — Patient Instructions (Signed)
  Homework this week: 1. Establish a schedule for going to the gym: SET AN APPOINTMENT TIME TO BE THERE  Stretching  Scissors  Deep knee bends  ?  Pull down  ?   Sit ups  Weighted leg push  150# twist  25 Rockette's  2. Write [employer] schedule in your calendar  3. Write down things you need to do at home  4. Play cognitive games each day  5. Complete pulmonary rehab exercises 2-3x/week  6. Reward motivation!!

## 2016-07-22 ENCOUNTER — Ambulatory Visit (INDEPENDENT_AMBULATORY_CARE_PROVIDER_SITE_OTHER): Payer: Medicare Other | Admitting: *Deleted

## 2016-07-22 DIAGNOSIS — I639 Cerebral infarction, unspecified: Secondary | ICD-10-CM

## 2016-07-22 LAB — PATHOLOGY

## 2016-07-23 ENCOUNTER — Ambulatory Visit: Payer: Medicare Other | Admitting: Internal Medicine

## 2016-07-23 ENCOUNTER — Ambulatory Visit: Payer: Medicare Other | Admitting: *Deleted

## 2016-07-23 DIAGNOSIS — I69318 Other symptoms and signs involving cognitive functions following cerebral infarction: Secondary | ICD-10-CM | POA: Diagnosis not present

## 2016-07-23 DIAGNOSIS — R41841 Cognitive communication deficit: Secondary | ICD-10-CM | POA: Diagnosis not present

## 2016-07-23 DIAGNOSIS — R41842 Visuospatial deficit: Secondary | ICD-10-CM | POA: Diagnosis not present

## 2016-07-23 NOTE — Therapy (Signed)
[facility] [address] [place], [place], [address] Phone: [phone]   Fax:  [phone]  Speech Language Pathology Treatment  Patient Details  Name: [name] [name] MRN: [id number] Date of Birth: 26-Jan-1940 Referring Provider: Dr. Rosalin Hawking  Encounter Date: 07/23/2016      End of Session - 07/23/16 1449    Visit Number 5   Number of Visits 17   Date for SLP Re-Evaluation 09/03/16   SLP Start Time 1315   SLP Stop Time  1400   SLP Time Calculation (min) 45 min   Activity Tolerance Patient tolerated treatment well      Past Medical History:  Diagnosis Date  . Arthritis    s/p TKR  . Bladder cancer (HCC)    [facility], [place] low grade papillary urothileal carcinoma  . BPH (benign prostatic hyperplasia)   . Brugada syndrome    Possible Type II Brugada ECG pattern. No family history of SCD, no syncope, no tachypalpitations.  Marland Kitchen CAD (coronary artery disease)    a. LHC 1/15 - mid LCx 50 and 60%, proximal RCA 50%  . Chronic diastolic CHF (congestive heart failure) ([place])   . CKD (chronic kidney disease)   . COPD (chronic obstructive pulmonary disease) ([place])    Prior heavy smoker. PFTs (3/11): FVC 87%, FEV1 73%, ratio 0.57, DLCO 75%, TLC 121%. Moderate obstructive defect. PFTs (9/15): Only minimal obstruction, sugPrior heavy smoker. PFTs (3/11): FVC 87%, FEV1 73%, ratio 0.57, DLCO 75%, TLC 121%. Moderate obstructive defect. PFTs (9/15) minimal obstruction, poss asthma component   . Depression    with bipolar tendencies  . DOE (dyspnea on exertion)    a. Myoview 8/09- EF 57%, no ischemia // b. Myoview (3/11) EF 68%, diaphragmatic attenuation, no ischemia //  c Echo (4/11) EF 50-09%, mild diastolic dysfunction, PASP 38 mmHg // d. PFTs 9/15 minimal obstruction  /  e. CT negative for ILD  . GERD (gastroesophageal reflux disease)   . History of Doppler ultrasound    Carotid US 3/11 negative for significant stenosis  //  carotid  US 6/17: Mild bilateral plaque, 1-39% ICA  . History of echocardiogram    a. Echo 12/14 Inferior and distal septal HK, mild LVH, EF 50-55%, mild MR, mild LAE  //  b. Echo 6/17: Mild focal basal septal hypertrophy, EF 60-65%, normal wall motion, grade 1 diastolic dysfunction, mild AI  . HLD (hyperlipidemia)   . Hypertensive heart disease with CHF (congestive heart failure) ([place]) 04/08/2009  . Low testosterone   . Lung nodule    a. CT in 2015 >> PET in 12/15 not sugg of malignancy   . LVH (left ventricular hypertrophy)    a. Echo 12/14 Inferior and distal septal HK, mild LVH, EF 50-55%, mild MR, mild LAE  . Mild dementia   . Mitral regurgitation    Echo (4/11) with PISA ERO 0.3 cm^2 and regurgitant volume 41 mL (moderate MR). Echo (12/14) with mild MR.   . MS (multiple sclerosis) ([place])   . MS (multiple sclerosis) ([place])   . Right bundle branch block   . Stroke ([place])   . Thoracic aortic aneurysm (HCC)    4.1 cm 2017  . Urinary retention with incomplete bladder emptying    receives botox injections and treatment for BPH through [facility]    Past Surgical History:  Procedure Laterality Date  . APPENDECTOMY    . EP IMPLANTABLE DEVICE N/A 06/23/2015   Procedure: Loop Recorder Insertion;  Surgeon: Remo Lipps  Peterson Lombard, MD;  Location: [facility] CV LAB;  Service: Cardiovascular;  Laterality: N/A;  . HEMORRHOID SURGERY    . LEFT HEART CATHETERIZATION WITH CORONARY ANGIOGRAM N/A 01/05/2013   Procedure: LEFT HEART CATHETERIZATION WITH CORONARY ANGIOGRAM;  Surgeon: Larey Dresser, MD;  Location: [facility] CATH LAB;  Service: Cardiovascular;  Laterality: N/A;  . ROTATOR CUFF REPAIR    . TEE WITHOUT CARDIOVERSION N/A 06/23/2015   Procedure: TRANSESOPHAGEAL ECHOCARDIOGRAM (TEE);  Surgeon: Fay Records, MD;  Location: [facility] ENDOSCOPY;  Service: Cardiovascular;  Laterality: N/A;  . TONSILLECTOMY    . TOTAL KNEE ARTHROPLASTY Right   . TRANSURETHRAL RESECTION OF PROSTATE      There were no vitals filed for this  visit.      Subjective Assessment - 07/23/16 1309    Subjective I'm doing fine   Patient is accompained by: Family member  carolyn   Currently in Pain? No/denies               ADULT SLP TREATMENT - 07/23/16 0001      General Information   Behavior/Cognition Alert;Cooperative;Pleasant mood     Treatment Provided   Treatment provided Cognitive-Linquistic     Pain Assessment   Pain Assessment No/denies pain     Cognitive-Linquistic Treatment   Treatment focused on Cognition;Patient/family/caregiver education   Skilled Treatment Skilled ST focused on problem solving and attention to detail during task to fill med Environmental education officer. Pt given suggestions to turn bottles over once finished to keep them straight (which had been done, and which had not). Pt required mod+ cues to problem solve what he would do when medication got down to one more pill (call for refill). Pt required max verbal cues to start deductive reasoning puzzle #1. Pt was encouraged to write down all medication names, dosages and frequencies to increase awareness of what he is taking and why.     Assessment / Recommendations / Plan   Plan Continue with current plan of care     Progression Toward Goals   Progression toward goals Progressing toward goals          SLP Education - 07/23/16 1327    Education provided Yes   Education Details filling med Environmental education officer, continue using calendar, write meds down   Person(s) Educated Patient;Spouse   Methods Explanation;Demonstration;Verbal cues   Comprehension Verbalized understanding;Returned demonstration;Need further instruction;Verbal cues required          SLP Short Term Goals - 07/23/16 1451      SLP SHORT TERM GOAL #1   Title Pt will implement functional system to facilitate recall, using it at least 2x/week between therapies.   Time 3   Period Weeks   Status On-going     SLP SHORT TERM GOAL #2   Title Pt will verbalize 3 cognitive deficits given min cues    Time 3   Period Weeks   Status On-going     SLP SHORT TERM GOAL #3   Title Pt will complete simple organization, reasoning, problem solving tasks with 80% accuracy given mod assist   Time 4   Period Weeks   Status On-going          SLP Long Term Goals - 07/23/16 1451      SLP LONG TERM GOAL #1   Title Pt/wife will report improved functional recall of appointments, medications, events, activities using identified strategy, recalling 2+ events between therapy sessions with min reminders from wife.   Time 7   Period Weeks  Status On-going     SLP LONG TERM GOAL #2   Title Pt will verbalize at least 2 compensatory strategies for each deficit identified.    Time 7   Period Weeks   Status On-going     SLP LONG TERM GOAL #3   Title Pt will complete complex thought organization, reasoning, problem solving tasks with 80% accuracy given min assist.   Time 7   Period Weeks   Status On-going     SLP LONG TERM GOAL #4   Title Pt will accurately load med organizer without verbal cues   Time 7   Period Weeks   Status On-going     SLP LONG TERM GOAL #5   Title Pt/wife will report pt to be modified - independent with self medication at home, requiring only supervision during administration   Time 7   Period Weeks   Status On-going          Plan - 07/23/16 1449    Clinical Impression Statement Pt improving with attention to task filling med box, utilizing calendar to keep track of appointments. Continued ST intervention is recommended to maximize cognition for safety and independence and decrease caregiver burden   Speech Therapy Frequency 2x / week   Duration --  8 weeks   Potential Considerations Ability to learn/carryover information;Family/community support;Previous level of function;Cooperation/participation level;Severity of impairments   SLP Home Exercise Plan homework assignments given   Consulted and Agree with Plan of Care Patient;Family member/caregiver   Family  Member Consulted wife carolyn      Patient will benefit from skilled therapeutic intervention in order to improve the following deficits and impairments:   Cognitive communication deficit    Problem List Patient Active Problem List   Diagnosis Date Noted  . 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 Acute ischemic stroke ([place]) 06/22/2016  . Stroke (cerebrum) ([place]) 06/21/2016  . Expressive aphasia   . Essential hypertension 05/17/2016  . Family history of stroke 04/06/2016  . Cognitive deficits 04/06/2016  . Cerebrovascular accident (CVA) due to embolism of cerebral artery ([place]) 04/04/2016  . Syncope and collapse   . Dysarthria   . Acute cystitis without hematuria   . History of ischemic right MCA stroke   . Slurred speech 06/20/2015  . AKI (acute kidney injury) ([place]) 06/20/2015  . Thoracic aortic aneurysm ([place])   . Hyperlipidemia 06/05/2015  . Gastroesophageal reflux disease without esophagitis 06/05/2015  . COPD  GOLD 0  09/04/2013  . Lung nodule 09/04/2013  . Cancer ([place])   . Coronary artery disease involving native coronary artery of native heart without angina pectoris 02/01/2013  . Chronic diastolic CHF (congestive heart failure) ([place]) 01/03/2013  . Depression   . Urinary retention with incomplete bladder emptying   . BPH (benign prostatic hyperplasia)   . Brugada syndrome 08/23/2011  . Hypertensive heart disease with CHF (congestive heart failure) ([place]) 04/08/2009  . Mitral valve disorder 04/08/2009  . CAROTID BRUIT, RIGHT 03/06/2009  . SHORTNESS OF BREATH 03/06/2009  . OTHER CHEST PAIN 03/06/2009  . Possible Multiple sclerosis ([place]) 10/11/2006   Arkin Imran B. Eagle, MSP, CCC-SLP  Shonna Chock 07/23/2016, 2:52 PM  [facility] [address] [place], [place], [address] Phone: [phone]   Fax:  [phone]   Name: [name] [name] MRN: [id number] Date of Birth: January 28, 1940

## 2016-07-23 NOTE — Patient Instructions (Addendum)
  Homework  1. When [name] [name] fills your med organizer next, fill one with her  2. Write down med names, dosage, frequency. We can make a sheet in therapy  3. Clean up after yourself  4. Make the bed  5. Write down ALL appointments in calendar!!!  6. Finish deduction puzzle #1

## 2016-07-26 ENCOUNTER — Ambulatory Visit: Payer: Medicare Other | Admitting: Acute Care

## 2016-07-27 ENCOUNTER — Encounter: Payer: Self-pay | Admitting: *Deleted

## 2016-07-27 ENCOUNTER — Ambulatory Visit: Payer: Medicare Other | Admitting: *Deleted

## 2016-07-27 DIAGNOSIS — R41842 Visuospatial deficit: Secondary | ICD-10-CM | POA: Diagnosis not present

## 2016-07-27 DIAGNOSIS — R41841 Cognitive communication deficit: Secondary | ICD-10-CM | POA: Diagnosis not present

## 2016-07-27 DIAGNOSIS — I69318 Other symptoms and signs involving cognitive functions following cerebral infarction: Secondary | ICD-10-CM | POA: Diagnosis not present

## 2016-07-27 NOTE — Patient Instructions (Signed)
   HOMEWORK 1. Find something easy to read - short, light. Read it to yourself, then have [name] ask you questions about it - this works on your reading comprehension AND recall  2. Use a blank piece of paper to limit visual input  3. Break down information into smaller pieces (eating an elephant)

## 2016-07-27 NOTE — Progress Notes (Signed)
Carelink Summary Report / Loop Recorder 

## 2016-07-27 NOTE — Therapy (Signed)
[employer] [address] [place], [place], [address] Phone: [phone]   Fax:  [phone]  Speech Language Pathology Treatment  Patient Details  Name: [name] [name] MRN: [id number] Date of Birth: [birth year] Referring Provider: Dr. Rosalin Hawking  Encounter Date: 07/27/2016      End of Session - 07/27/16 1125    Visit Number 6   Number of Visits 17   Date for SLP Re-Evaluation 09/03/16   SLP Start Time 46   SLP Stop Time  1115   SLP Time Calculation (min) 55 min   Activity Tolerance Patient tolerated treatment well      Past Medical History:  Diagnosis Date  . Arthritis    s/p TKR  . Bladder cancer (HCC)    [facility] [place] low grade papillary urothileal carcinoma  . BPH (benign prostatic hyperplasia)   . Brugada syndrome    Possible Type II Brugada ECG pattern. No family history of SCD, no syncope, no tachypalpitations.  Marland Kitchen CAD (coronary artery disease)    a. LHC 1/15 - mid LCx 50 and 60%, proximal RCA 50%  . Chronic diastolic CHF (congestive heart failure) ([place])   . CKD (chronic kidney disease)   . COPD (chronic obstructive pulmonary disease) ([place])    Prior heavy smoker. PFTs (3/11): FVC 87%, FEV1 73%, ratio 0.57, DLCO 75%, TLC 121%. Moderate obstructive defect. PFTs (9/15): Only minimal obstruction, sugPrior heavy smoker. PFTs (3/11): FVC 87%, FEV1 73%, ratio 0.57, DLCO 75%, TLC 121%. Moderate obstructive defect. PFTs (9/15) minimal obstruction, poss asthma component   . Depression    with bipolar tendencies  . DOE (dyspnea on exertion)    a. Myoview 8/09- EF 57%, no ischemia // b. Myoview (3/11) EF 68%, diaphragmatic attenuation, no ischemia //  c Echo (4/11) EF 62-37%, mild diastolic dysfunction, PASP 38 mmHg // d. PFTs 9/15 minimal obstruction  /  e. CT negative for ILD  . GERD (gastroesophageal reflux disease)   . History of Doppler ultrasound    Carotid US 3/11 negative for significant stenosis  //  carotid  US 6/17: Mild bilateral plaque, 1-39% ICA  . History of echocardiogram    a. Echo 12/14 Inferior and distal septal HK, mild LVH, EF 50-55%, mild MR, mild LAE  //  b. Echo 6/17: Mild focal basal septal hypertrophy, EF 60-65%, normal wall motion, grade 1 diastolic dysfunction, mild AI  . HLD (hyperlipidemia)   . Hypertensive heart disease with CHF (congestive heart failure) ([place]) 04/08/2009  . Low testosterone   . Lung nodule    a. CT in 2015 >> PET in 12/15 not sugg of malignancy   . LVH (left ventricular hypertrophy)    a. Echo 12/14 Inferior and distal septal HK, mild LVH, EF 50-55%, mild MR, mild LAE  . Mild dementia   . Mitral regurgitation    Echo (4/11) with PISA ERO 0.3 cm^2 and regurgitant volume 41 mL (moderate MR). Echo (12/14) with mild MR.   . MS (multiple sclerosis) ([place])   . MS (multiple sclerosis) ([place])   . Right bundle branch block   . Stroke ([place])   . Thoracic aortic aneurysm (HCC)    4.1 cm 2017  . Urinary retention with incomplete bladder emptying    receives botox injections and treatment for BPH through [facility]    Past Surgical History:  Procedure Laterality Date  . APPENDECTOMY    . EP IMPLANTABLE DEVICE N/A 06/23/2015   Procedure: Loop Recorder Insertion;  Surgeon: Remo Lipps  Peterson Lombard, MD;  Location: [facility];  Service: Cardiovascular;  Laterality: N/A;  . HEMORRHOID SURGERY    . LEFT HEART CATHETERIZATION WITH CORONARY ANGIOGRAM N/A 01/05/2013   Procedure: LEFT HEART CATHETERIZATION WITH CORONARY ANGIOGRAM;  Surgeon: Larey Dresser, MD;  Location: [facility] CATH LAB;  Service: Cardiovascular;  Laterality: N/A;  . ROTATOR CUFF REPAIR    . TEE WITHOUT CARDIOVERSION N/A 06/23/2015   Procedure: TRANSESOPHAGEAL ECHOCARDIOGRAM (TEE);  Surgeon: Fay Records, MD;  Location: [facility] ENDOSCOPY;  Service: Cardiovascular;  Laterality: N/A;  . TONSILLECTOMY    . TOTAL KNEE ARTHROPLASTY Right   . TRANSURETHRAL RESECTION OF PROSTATE      There were no vitals filed for this  visit.      Subjective Assessment - 07/27/16 1026    Subjective It's frustrating (difficulty with reading/recall)   Patient is accompained by: Family member  [name] [name]    Currently in Pain? No/denies               ADULT SLP TREATMENT - 07/27/16 0001      General Information   Behavior/Cognition Alert;Cooperative;Pleasant mood     Treatment Provided   Treatment provided Cognitive-Linquistic     Pain Assessment   Pain Assessment No/denies pain     Cognitive-Linquistic Treatment   Treatment focused on Cognition;Patient/family/caregiver education   Skilled Treatment ST session focused on problem solving and reading comprehension. Pt continues to use his calendar to keep track of appointments. He also reports that he filled his med box without difficulty yesterday. He prefers to fill it once per week (rather than monthly as his wife does), and was encouraged to continue to do this, increasing independence as [name] [name] approves. Pt requires significant cueing to complete word math problems, which he indicates is "frustrating" due to his level of ability prior to his strokes. He indicates a desire for things to be back the way they were. Education was provided regarding effects of multiple CVAs on the brain and on function. Pt was encouraged regarding ths progress he has made thus far, and the increase in his awareness of deficits.     Assessment / Recommendations / Plan   Plan Continue with current plan of care     Progression Toward Goals   Progression toward goals Progressing toward goals          SLP Education - 07/27/16 1124    Education provided Yes   Education Details math word problems, reading comprehension, minimizing distractions/visual (over)stimulation when reading   Person(s) Educated Patient;Spouse   Methods Explanation;Demonstration;Handout;Verbal cues   Comprehension Verbalized understanding;Returned demonstration;Verbal cues required;Need further instruction           SLP Short Term Goals - 07/27/16 1126      SLP SHORT TERM GOAL #1   Title Pt will implement functional system to facilitate recall, using it at least 2x/week between therapies.   Time 2   Period Weeks   Status On-going     SLP SHORT TERM GOAL #2   Title Pt will verbalize 3 cognitive deficits given min cues   Time 2   Period Weeks   Status On-going     SLP SHORT TERM GOAL #3   Title Pt will complete simple organization, reasoning, problem solving tasks with 80% accuracy given mod assist   Time 2   Period Weeks   Status On-going          SLP Long Term Goals - 07/27/16 1127  SLP LONG TERM GOAL #1   Title Pt/wife will report improved functional recall of appointments, medications, events, activities using identified strategy, recalling 2+ events between therapy sessions with min reminders from wife.   Time 6   Period Weeks   Status On-going     SLP LONG TERM GOAL #2   Title Pt will verbalize at least 2 compensatory strategies for each deficit identified.    Time 6   Period Weeks   Status On-going     SLP LONG TERM GOAL #3   Title Pt will complete complex thought organization, reasoning, problem solving tasks with 80% accuracy given min assist.   Time 6   Period Weeks   Status On-going     SLP LONG TERM GOAL #4   Title Pt will accurately load med organizer without verbal cues   Time 6   Period Weeks   Status On-going     SLP LONG TERM GOAL #5   Title Pt/wife will report pt to be modified - independent with self medication at home, requiring only supervision during administration   Time 6   Period Weeks   Status On-going          Plan - 07/27/16 1125    Clinical Impression Statement Pt continues to be motivated in and outside of therapy, but reports frustration with current deficits, indicating increased awareness of deficits. Continued ST intervention is recommended to maximize cognitive linguistics for safety and independence and decrease  caregiver burden.   Speech Therapy Frequency 2x / week   Duration --  8 weeks   Treatment/Interventions Environmental controls;Cueing hierarchy;SLP instruction and feedback;Compensatory strategies;Functional tasks;Cognitive reorganization;Patient/family education;Multimodal communcation approach;Internal/external aids   Potential to Achieve Goals Fair   Potential Considerations Ability to learn/carryover information;Family/community support;Previous level of function;Cooperation/participation level;Severity of impairments   SLP Home Exercise Plan homework assignments given   Consulted and Agree with Plan of Care Patient;Family member/caregiver   Family Member Consulted wife carolyn      Patient will benefit from skilled therapeutic intervention in order to improve the following deficits and impairments:   Cognitive communication deficit    Problem List Patient Active Problem List   Diagnosis Date Noted  . 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 Acute ischemic stroke ([place]) 06/22/2016  . Stroke (cerebrum) ([place]) 06/21/2016  . Expressive aphasia   . Essential hypertension 05/17/2016  . Family history of stroke 04/06/2016  . Cognitive deficits 04/06/2016  . Cerebrovascular accident (CVA) due to embolism of cerebral artery ([place]) 04/04/2016  . Syncope and collapse   . Dysarthria   . Acute cystitis without hematuria   . History of ischemic right MCA stroke   . Slurred speech 06/20/2015  . AKI (acute kidney injury) ([place]) 06/20/2015  . Thoracic aortic aneurysm ([place])   . Hyperlipidemia 06/05/2015  . Gastroesophageal reflux disease without esophagitis 06/05/2015  . COPD  GOLD 0  09/04/2013  . Lung nodule 09/04/2013  . Cancer ([place])   . Coronary artery disease involving native coronary artery of native heart without angina pectoris 02/01/2013  . Chronic diastolic CHF (congestive heart failure) ([place]) 01/03/2013  . Depression   . Urinary retention with incomplete bladder emptying   . BPH (benign  prostatic hyperplasia)   . Brugada syndrome 08/23/2011  . Hypertensive heart disease with CHF (congestive heart failure) ([place]) 04/08/2009  . Mitral valve disorder 04/08/2009  . CAROTID BRUIT, RIGHT 03/06/2009  . SHORTNESS OF BREATH 03/06/2009  . OTHER CHEST PAIN 03/06/2009  . Possible Multiple sclerosis ([place])  10/11/2006   Marguerite Barba B. O'Kean, [place], CCC-SLP  Shonna Chock 07/27/2016, 11:28 AM  [facility] [address] [place], [place], [address] Phone: [phone]   Fax:  [phone]   Name: KHYLON DAVIES MRN: [id number] Date of Birth: January 05, 1940

## 2016-07-30 ENCOUNTER — Ambulatory Visit: Payer: Medicare Other | Admitting: *Deleted

## 2016-07-30 DIAGNOSIS — I69318 Other symptoms and signs involving cognitive functions following cerebral infarction: Secondary | ICD-10-CM | POA: Diagnosis not present

## 2016-07-30 DIAGNOSIS — R41841 Cognitive communication deficit: Secondary | ICD-10-CM | POA: Diagnosis not present

## 2016-07-30 DIAGNOSIS — R41842 Visuospatial deficit: Secondary | ICD-10-CM | POA: Diagnosis not present

## 2016-07-30 NOTE — Therapy (Signed)
[facility] [address] [place], [place], [address] Phone: [phone]   Fax:  [phone]  Speech Language Pathology Treatment  Patient Details  Name: [name] [name] MRN: [id number] Date of Birth: [birth year] Referring Provider: Dr. Rosalin Hawking  Encounter Date: 07/30/2016      End of Session - 07/30/16 1455    Visit Number 7   Number of Visits 17   Date for SLP Re-Evaluation 09/03/16   SLP Start Time 1350   SLP Stop Time  1450   SLP Time Calculation (min) 60 min   Activity Tolerance Patient tolerated treatment well      Past Medical History:  Diagnosis Date  . Arthritis    s/p TKR  . Bladder cancer (HCC)    [facility], [place] low grade papillary urothileal carcinoma  . BPH (benign prostatic hyperplasia)   . Brugada syndrome    Possible Type II Brugada ECG pattern. No family history of SCD, no syncope, no tachypalpitations.  Marland Kitchen CAD (coronary artery disease)    a. LHC 1/15 - mid LCx 50 and 60%, proximal RCA 50%  . Chronic diastolic CHF (congestive heart failure) ([place])   . CKD (chronic kidney disease)   . COPD (chronic obstructive pulmonary disease) ([place])    Prior heavy smoker. PFTs (3/11): FVC 87%, FEV1 73%, ratio 0.57, DLCO 75%, TLC 121%. Moderate obstructive defect. PFTs (9/15): Only minimal obstruction, sugPrior heavy smoker. PFTs (3/11): FVC 87%, FEV1 73%, ratio 0.57, DLCO 75%, TLC 121%. Moderate obstructive defect. PFTs (9/15) minimal obstruction, poss asthma component   . Depression    with bipolar tendencies  . DOE (dyspnea on exertion)    a. Myoview 8/09- EF 57%, no ischemia // b. Myoview (3/11) EF 68%, diaphragmatic attenuation, no ischemia //  c Echo (4/11) EF 32-20%, mild diastolic dysfunction, PASP 38 mmHg // d. PFTs 9/15 minimal obstruction  /  e. CT negative for ILD  . GERD (gastroesophageal reflux disease)   . History of Doppler ultrasound    Carotid US 3/11 negative for significant stenosis  //  carotid  US 6/17: Mild bilateral plaque, 1-39% ICA  . History of echocardiogram    a. Echo 12/14 Inferior and distal septal HK, mild LVH, EF 50-55%, mild MR, mild LAE  //  b. Echo 6/17: Mild focal basal septal hypertrophy, EF 60-65%, normal wall motion, grade 1 diastolic dysfunction, mild AI  . HLD (hyperlipidemia)   . Hypertensive heart disease with CHF (congestive heart failure) ([place]) 04/08/2009  . Low testosterone   . Lung nodule    a. CT in 2015 >> PET in 12/15 not sugg of malignancy   . LVH (left ventricular hypertrophy)    a. Echo 12/14 Inferior and distal septal HK, mild LVH, EF 50-55%, mild MR, mild LAE  . Mild dementia   . Mitral regurgitation    Echo (4/11) with PISA ERO 0.3 cm^2 and regurgitant volume 41 mL (moderate MR). Echo (12/14) with mild MR.   . MS (multiple sclerosis) ([place])   . MS (multiple sclerosis) ([place])   . Right bundle branch block   . Stroke ([place])   . Thoracic aortic aneurysm (HCC)    4.1 cm 2017  . Urinary retention with incomplete bladder emptying    receives botox injections and treatment for BPH through [facility]    Past Surgical History:  Procedure Laterality Date  . APPENDECTOMY    . EP IMPLANTABLE DEVICE N/A 06/23/2015   Procedure: Loop Recorder Insertion;  Surgeon: Remo Lipps  Peterson Lombard, MD;  Location: [facility] CV LAB;  Service: Cardiovascular;  Laterality: N/A;  . HEMORRHOID SURGERY    . LEFT HEART CATHETERIZATION WITH CORONARY ANGIOGRAM N/A 01/05/2013   Procedure: LEFT HEART CATHETERIZATION WITH CORONARY ANGIOGRAM;  Surgeon: Larey Dresser, MD;  Location: [facility] CATH LAB;  Service: Cardiovascular;  Laterality: N/A;  . ROTATOR CUFF REPAIR    . TEE WITHOUT CARDIOVERSION N/A 06/23/2015   Procedure: TRANSESOPHAGEAL ECHOCARDIOGRAM (TEE);  Surgeon: Fay Records, MD;  Location: [facility] ENDOSCOPY;  Service: Cardiovascular;  Laterality: N/A;  . TONSILLECTOMY    . TOTAL KNEE ARTHROPLASTY Right   . TRANSURETHRAL RESECTION OF PROSTATE      There were no vitals filed for this  visit.      Subjective Assessment - 07/30/16 1347    Subjective Pt/wife going on a trip next week.   Patient is accompained by: Family member  wife [name] [name]   Currently in Pain? No/denies               ADULT SLP TREATMENT - 07/30/16 0001      General Information   Behavior/Cognition Alert;Cooperative;Pleasant mood     Treatment Provided   Treatment provided Cognitive-Linquistic     Pain Assessment   Pain Assessment No/denies pain     Cognitive-Linquistic Treatment   Treatment focused on Cognition;Patient/family/caregiver education   Skilled Treatment Skilled ST session targeted word math problems deductive reasoning puzzle #1, and identification of cognitive deficits related to home tasks with [employer] app. Pt requires mod assistance to correctly complete word math problems, which is frustrating to him given his level of mathematic ability prior to his CVAs. Mod+ assist required to finish deduction puzzle #1. SLP highlighted specific information on deduction puzzle #2, and encouraged pt to continue to work on tasks while on his trip. Wife asked about how she could encourage him to work on speech tasks - SLP instructed pt to work 1 hour a day on activities, with pt/wife to determine the best way to achieve that (30 min x2, 15 min x4, etc).      Assessment / Recommendations / Plan   Plan Continue with current plan of care     Progression Toward Goals   Progression toward goals Progressing toward goals          SLP Education - 07/30/16 1454    Education provided Yes   Education Details Deductive reasoning puzzle #2, continue working on word math problems, figurative explanation of idioms   Person(s) Educated Patient;Spouse   Methods Explanation;Demonstration;Verbal cues;Handout   Comprehension Verbalized understanding;Returned demonstration;Verbal cues required          SLP Short Term Goals - 07/30/16 1458      SLP SHORT TERM GOAL #1   Title Pt will  implement functional system to facilitate recall, using it at least 2x/week between therapies.   Status On-going     SLP SHORT TERM GOAL #2   Title Pt will verbalize 3 cognitive deficits given min cues   Status Achieved     SLP SHORT TERM GOAL #3   Title Pt will complete simple organization, reasoning, problem solving tasks with 80% accuracy given mod assist   Time 2   Period Weeks   Status On-going          SLP Long Term Goals - 07/30/16 1458      SLP LONG TERM GOAL #1   Title Pt/wife will report improved functional recall of appointments, medications, events, activities using identified  strategy, recalling 2+ events between therapy sessions with min reminders from wife.   Time 6   Period Weeks   Status On-going     SLP LONG TERM GOAL #2   Title Pt will verbalize at least 2 compensatory strategies for each deficit identified.    Time 6   Period Weeks   Status On-going     SLP LONG TERM GOAL #3   Title Pt will complete complex thought organization, reasoning, problem solving tasks with 80% accuracy given min assist.   Time 6   Period Weeks   Status On-going     SLP LONG TERM GOAL #4   Title Pt will accurately load med organizer without verbal cues   Time 6   Period Weeks   Status On-going     SLP LONG TERM GOAL #5   Title Pt/wife will report pt to be modified - independent with self medication at home, requiring only supervision during administration   Time 6   Period Weeks   Status On-going          Plan - 07/30/16 1456    Clinical Impression Statement Pt did not complete homework activities this time, and appeared to have more difficulty hearing today. [name] [name] indicated [facility]. Pt demonstrated good attention to task, problem solving and reasoning during therapy activities today. Homework given for time until pt returns to therapy. Continued ST intervention is recommended to mzximize cognitive linguistics for  safety and independence and decreased caregiver burden.   Speech Therapy Frequency 2x / week   Duration --  8 weeks   Treatment/Interventions Environmental controls;Cueing hierarchy;SLP instruction and feedback;Compensatory strategies;Functional tasks;Cognitive reorganization;Patient/family education;Multimodal communcation approach;Internal/external aids   Potential to Achieve Goals Fair   Potential Considerations Ability to learn/carryover information;Family/community support;Previous level of function;Cooperation/participation level;Severity of impairments   SLP Home Exercise Plan homework assignments given   Consulted and Agree with Plan of Care Patient;Family member/caregiver   Family Member Consulted wife carolyn      Patient will benefit from skilled therapeutic intervention in order to improve the following deficits and impairments:   Cognitive communication deficit    Problem List Patient Active Problem List   Diagnosis Date Noted  . 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 Acute ischemic stroke ([place]) 06/22/2016  . Stroke (cerebrum) ([place]) 06/21/2016  . Expressive aphasia   . Essential hypertension 05/17/2016  . Family history of stroke 04/06/2016  . Cognitive deficits 04/06/2016  . Cerebrovascular accident (CVA) due to embolism of cerebral artery ([place]) 04/04/2016  . Syncope and collapse   . Dysarthria   . Acute cystitis without hematuria   . History of ischemic right MCA stroke   . Slurred speech 06/20/2015  . AKI (acute kidney injury) ([place]) 06/20/2015  . Thoracic aortic aneurysm ([place])   . Hyperlipidemia 06/05/2015  . Gastroesophageal reflux disease without esophagitis 06/05/2015  . COPD  GOLD 0  09/04/2013  . Lung nodule 09/04/2013  . Cancer ([place])   . Coronary artery disease involving native coronary artery of native heart without angina pectoris 02/01/2013  . Chronic diastolic CHF (congestive heart failure) ([place]) 01/03/2013  . Depression   . Urinary retention with incomplete  bladder emptying   . BPH (benign prostatic hyperplasia)   . Brugada syndrome 08/23/2011  . Hypertensive heart disease with CHF (congestive heart failure) ([place]) 04/08/2009  . Mitral valve disorder 04/08/2009  . CAROTID BRUIT, RIGHT 03/06/2009  . SHORTNESS OF BREATH 03/06/2009  .  OTHER CHEST PAIN 03/06/2009  . Possible Multiple sclerosis ([place]) 10/11/2006   Erle Guster B. Diggins, MSP, CCC-SLP  Shonna Chock 07/30/2016, 2:59 PM  [facility] [address] [place], [place], [address] Phone: [phone]   Fax:  [phone]   Name: LONN IM MRN: [id number] Date of Birth: 07-15-1940

## 2016-08-06 LAB — CUP PACEART REMOTE DEVICE CHECK
Implantable Pulse Generator Implant Date: 20170619
MDC IDC SESS DTM: 20180720044301

## 2016-08-09 ENCOUNTER — Encounter: Payer: Self-pay | Admitting: *Deleted

## 2016-08-13 ENCOUNTER — Ambulatory Visit: Payer: Medicare Other | Attending: Neurology

## 2016-08-13 DIAGNOSIS — R41841 Cognitive communication deficit: Secondary | ICD-10-CM | POA: Diagnosis not present

## 2016-08-13 NOTE — Therapy (Signed)
[facility] [address] [place], [place], [address] Phone: [phone]   Fax:  [phone]  Speech Language Pathology Treatment  Patient Details  Name: [name] [name] MRN: [id number] Date of Birth: 1940/09/02 Referring Provider: Dr. Rosalin Hawking  Encounter Date: 08/13/2016      End of Session - 08/13/16 1250    Visit Number 8   Number of Visits 17   Date for SLP Re-Evaluation 09/03/16   SLP Start Time 1150   SLP Stop Time  7001   SLP Time Calculation (min) 45 min   Activity Tolerance Patient tolerated treatment well      Past Medical History:  Diagnosis Date  . Arthritis    s/p TKR  . Bladder cancer (HCC)    [facility] [place] low grade papillary urothileal carcinoma  . BPH (benign prostatic hyperplasia)   . Brugada syndrome    Possible Type II Brugada ECG pattern. No family history of SCD, no syncope, no tachypalpitations.  [place] CAD (coronary artery disease)    a. LHC 1/15 - mid LCx 50 and 60%, proximal RCA 50%  . Chronic diastolic CHF (congestive heart failure) ([place])   . CKD (chronic kidney disease)   . COPD (chronic obstructive pulmonary disease) ([place])    Prior heavy smoker. PFTs (3/11): FVC 87%, FEV1 73%, ratio 0.57, DLCO 75%, TLC 121%. Moderate obstructive defect. PFTs (9/15): Only minimal obstruction, sugPrior heavy smoker. PFTs (3/11): FVC 87%, FEV1 73%, ratio 0.57, DLCO 75%, TLC 121%. Moderate obstructive defect. PFTs (9/15) minimal obstruction, poss asthma component   . Depression    with bipolar tendencies  . DOE (dyspnea on exertion)    a. Myoview 8/09- EF 57%, no ischemia // b. Myoview (3/11) EF 68%, diaphragmatic attenuation, no ischemia //  c Echo (4/11) EF 74-94%, mild diastolic dysfunction, PASP 38 mmHg // d. PFTs 9/15 minimal obstruction  /  e. CT negative for ILD  . GERD (gastroesophageal reflux disease)   . History of Doppler ultrasound    Carotid US 3/11 negative for significant stenosis  //  carotid  US 6/17: Mild bilateral plaque, 1-39% ICA  . History of echocardiogram    a. Echo 12/14 Inferior and distal septal HK, mild LVH, EF 50-55%, mild MR, mild LAE  //  b. Echo 6/17: Mild focal basal septal hypertrophy, EF 60-65%, normal wall motion, grade 1 diastolic dysfunction, mild AI  . HLD (hyperlipidemia)   . Hypertensive heart disease with CHF (congestive heart failure) ([place]) 04/08/2009  . Low testosterone   . Lung nodule    a. CT in 2015 >> PET in 12/15 not sugg of malignancy   . LVH (left ventricular hypertrophy)    a. Echo 12/14 Inferior and distal septal HK, mild LVH, EF 50-55%, mild MR, mild LAE  . Mild dementia   . Mitral regurgitation    Echo (4/11) with PISA ERO 0.3 cm^2 and regurgitant volume 41 mL (moderate MR). Echo (12/14) with mild MR.   . MS (multiple sclerosis) ([place])   . MS (multiple sclerosis) ([place])   . Right bundle branch block   . Stroke ([place])   . Thoracic aortic aneurysm (HCC)    4.1 cm 2017  . Urinary retention with incomplete bladder emptying    receives botox injections and treatment for BPH through [facility]    Past Surgical History:  Procedure Laterality Date  . APPENDECTOMY    . EP IMPLANTABLE DEVICE N/A 06/23/2015   Procedure: Loop Recorder Insertion;  Surgeon: Remo Lipps  Peterson Lombard, MD;  Location: [facility];  Service: Cardiovascular;  Laterality: N/A;  . HEMORRHOID SURGERY    . LEFT HEART CATHETERIZATION WITH CORONARY ANGIOGRAM N/A 01/05/2013   Procedure: LEFT HEART CATHETERIZATION WITH CORONARY ANGIOGRAM;  Surgeon: Larey Dresser, MD;  Location: [facility] CATH LAB;  Service: Cardiovascular;  Laterality: N/A;  . ROTATOR CUFF REPAIR    . TEE WITHOUT CARDIOVERSION N/A 06/23/2015   Procedure: TRANSESOPHAGEAL ECHOCARDIOGRAM (TEE);  Surgeon: Fay Records, MD;  Location: [facility] ENDOSCOPY;  Service: Cardiovascular;  Laterality: N/A;  . TONSILLECTOMY    . TOTAL KNEE ARTHROPLASTY Right   . TRANSURETHRAL RESECTION OF PROSTATE      There were no vitals filed for this  visit.      Subjective Assessment - 08/13/16 1202    Subjective (wife) "We got back and he hasn't done any homework - we were ona trip."   Patient is accompained by: Family member  wife   Currently in Pain? No/denies               ADULT SLP TREATMENT - 08/13/16 1203      General Information   Behavior/Cognition Alert;Cooperative;Pleasant mood;Distractible;Decreased sustained attention     Treatment Provided   Treatment provided Cognitive-Linquistic     Cognitive-Linquistic Treatment   Treatment focused on Cognition   Skilled Treatment SLP faciliatated cognitve-lingjistics. Pt with explanation to SLP of what his deficit areas are: "Remembering." When asked for details pt spoke in circles and demonstrated decr'd attention and intellectual/emergent awareness. Wife stated pt does not think anything is wrong. SLP explained pt has to have intellectual awareness or therapy at this time is not efficacious. SLP told pt directly 3 deficit areas related to pt's cognitive lingistics and told him that the longer he waits to rememdiate these deficits the less chance he has of regaining lost skill/ability. SLP had pt sign agreement to complete 5 tasks by next session Wednesday 08-18-16.     Assessment / Recommendations / Plan   Plan Continue with current plan of care     Progression Toward Goals   Progression toward goals Progressing toward goals          SLP Education - 08/13/16 1249    Education provided Yes   Education Details deficit areas, need to complete homework to improve cognitive skills   Person(s) Educated Patient;Spouse   Methods Explanation;Handout   Comprehension Need further instruction;Verbalized understanding          SLP Short Term Goals - 08/13/16 1252      SLP SHORT TERM GOAL #1   Title Pt will implement functional system to facilitate recall, using it at least 2x/week between therapies.   Time 1   Period Weeks   Status On-going     SLP SHORT TERM GOAL  #2   Title Pt will verbalize 3 cognitive deficits given min cues   Status Achieved     SLP SHORT TERM GOAL #3   Title Pt will complete simple organization, reasoning, problem solving tasks with 80% accuracy given mod assist   Time 1   Period Weeks   Status On-going     SLP SHORT TERM GOAL #4   Title pt will complete cognitive tasks at home to improve cognitive function, 80% of days between sessions   Time 1   Period Weeks   Status New          SLP Long Term Goals - 08/13/16 1252      SLP LONG  TERM GOAL #1   Title Pt/wife will report improved functional recall of appointments, medications, events, activities using identified strategy, recalling 2+ events between therapy sessions with min reminders from wife.   Time 5   Period Weeks   Status On-going     SLP LONG TERM GOAL #2   Title Pt will verbalize at least 2 compensatory strategies for each deficit identified.    Time 5   Period Weeks   Status On-going     SLP LONG TERM GOAL #3   Title Pt will complete mod complex thought organization, reasoning, problem solving tasks with 80% accuracy given min assist.   Time 5   Period Weeks   Status Revised     SLP LONG TERM GOAL #4   Title Pt will accurately load med organizer without verbal cues   Time 5   Period Weeks   Status On-going     SLP LONG TERM GOAL #5   Title Pt/wife will report pt to be modified - independent with self medication at home, requiring only supervision during administration   Time 5   Period Weeks   Status On-going          Plan - 08/13/16 1250    Clinical Impression Statement Pt having difficulty with awareness of deficits and need for skilled speech therapy, despite encouragement from wife and SLP. Pt verbalized "remembering" as a deficit but pt not looking at those things in place at home for him to improve/compensate for memory deficits. See skilled intervension for more details. Pt would benefit from continued skilled ST intervention, only  if pt's intellectual awareness improves, to maximize independence and safety, and decreased caregiver burden.   Speech Therapy Frequency 2x / week   Duration --  8 weeks   Treatment/Interventions Environmental controls;Cueing hierarchy;SLP instruction and feedback;Compensatory strategies;Functional tasks;Cognitive reorganization;Patient/family education;Multimodal communcation approach;Internal/external aids   Potential to Achieve Goals Fair   Potential Considerations Ability to learn/carryover information;Family/community support;Previous level of function;Cooperation/participation level;Severity of impairments   SLP Home Exercise Plan homework assignments given   Consulted and Agree with Plan of Care Patient;Family member/caregiver   Family Member Consulted wife carolyn      Patient will benefit from skilled therapeutic intervention in order to improve the following deficits and impairments:   Cognitive communication deficit    Problem List Patient Active Problem List   Diagnosis Date Noted  . 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 Acute ischemic stroke ([place]) 06/22/2016  . Stroke (cerebrum) ([place]) 06/21/2016  . Expressive aphasia   . Essential hypertension 05/17/2016  . Family history of stroke 04/06/2016  . Cognitive deficits 04/06/2016  . Cerebrovascular accident (CVA) due to embolism of cerebral artery ([place]) 04/04/2016  . Syncope and collapse   . Dysarthria   . Acute cystitis without hematuria   . History of ischemic right MCA stroke   . Slurred speech 06/20/2015  . AKI (acute kidney injury) ([place]) 06/20/2015  . Thoracic aortic aneurysm ([place])   . Hyperlipidemia 06/05/2015  . Gastroesophageal reflux disease without esophagitis 06/05/2015  . COPD  GOLD 0  09/04/2013  . Lung nodule 09/04/2013  . Cancer ([place])   . Coronary artery disease involving native coronary artery of native heart without angina pectoris 02/01/2013  . Chronic diastolic CHF (congestive heart failure) ([place]) 01/03/2013   . Depression   . Urinary retention with incomplete bladder emptying   . BPH (benign prostatic hyperplasia)   . Brugada syndrome 08/23/2011  . Hypertensive heart disease with CHF (congestive heart  failure) ([place]) 04/08/2009  . Mitral valve disorder 04/08/2009  . CAROTID BRUIT, RIGHT 03/06/2009  . SHORTNESS OF BREATH 03/06/2009  . OTHER CHEST PAIN 03/06/2009  . Possible Multiple sclerosis ([place]) 10/11/2006    [facility] ,[place], [place]   08/13/2016, 12:55 PM  [facility] [address] [place], [place], [address] Phone: [phone]   Fax:  [phone]   Name: [name] [name] MRN: [id number] Date of Birth: Jul 10, 1940

## 2016-08-13 NOTE — Patient Instructions (Signed)
   I agree to do one thinking task each day (5 tasks) prior to next speech therapy on 08-18-16.  BRING THE ACTIVITIES BACK ON WED 08-18-16.

## 2016-08-16 ENCOUNTER — Telehealth: Payer: Self-pay | Admitting: Family Medicine

## 2016-08-16 MED ORDER — DOXAZOSIN MESYLATE 2 MG PO TABS: 2.0000 mg | ORAL_TABLET | Freq: Every day | ORAL | 1 refills | Status: DC

## 2016-08-16 NOTE — Telephone Encounter (Signed)
Medication refilled per protocol. 

## 2016-08-17 NOTE — Progress Notes (Signed)
Cardiology Office Note:    Date:  08/18/2016   ID:  WINSON EICHORN, DOB [birth year], MRN [id number]  PCP:  [name], MD  Cardiologist:Dr. Kirk Ruths McLean>>Dr. Harrell Gave End  Electrophysiologist: N/a Pulmonology: Dr. Chase Caller Neurology: Dr. Erlinda Hong  Referring MD: [name], MD   Chief Complaint  Patient presents with  . Follow-up    CHF, HTN, CAD, recurrent strokes    History of Present Illness:    [name] [name] is a 76 y.o. male with a hx of mod non-obstructive CAD on LHC in 3/47, diastolic HF, HTN, HL, GERD, COPD, recurrent strokes, AKI assoc with NSAID use, bipolar depression.   He has been admitted several times since 2017 with recurrent embolic CVAs. He was treated with tPA in 4/18. He underwent a Linq ILR implantation by Dr. [name] in 6/17. He has not had atrial fibrillation documented on his ILR to date. He has been changed from Plavix to Aggrenox and is now on Apixaban after another admission in 4/25 with an embolic CVA. CT in 4/18 demonstrated stable ascending aortic aneurysm at 4 cm and moderate thoracic aortic atherosclerosis. We reviewed with neurology. At that time, it was not felt that he needed anticoagulation with vitamin K antagonists or direct thrombin inhibitors.  Last seen by Dr. Harrell Gave End in 5/18.      Mr. Frampton returns for follow-up. He is here with his wife. He has been doing well recently. He denies significant dyspnea. He denies orthopnea or PND. He denies any change in his chronic, mild pedal edema. He denies chest pain, syncope or any bleeding issues. He seems to be tolerating anticoagulation with Apixaban.  Prior CV studies:   The following studies were reviewed today:  Chest CTA 04/28/16 IMPRESSION: 1. Stable fusiform dilatation of the ascending aorta measuring 4.0 cm in maximum diameter. Recommend annual imaging followup by CTA or MRA. This recommendation follows 2010 ACCF/AHA/AATS/ACR/ASA/SCA/SCAI/SIR/STS/SVM  Guidelines for the Diagnosis and Management of Patients with Thoracic Aortic Disease. Circulation. 2010; 121: Z563-O756. 2. Moderate thoracic aortic atherosclerosis. 3. Coronary artery calcifications. 4. Stable pulmonary nodules on the right consistent with prior inflammation and probable subsequent scarring. No new or enlarging pulmonary nodule is seen.  Echo 04/06/16 EF 55-60, normal wall motion, normal diastolic function, mild AI, mild MR  Echo 06/16/15 Mild focal basal septal hypertrophy, EF 60-65%, normal wall motion, grade 1 diastolic dysfunction, mild AI  Carotid US 06/16/15 Summary:Bilateral: mild soft plaque CCA and origin ICA. 1-39% ICA plaquing.  Chest CT 6/17 IMPRESSION: 1. The nodules are stable from 06/26/2013. More spiculated nodules inferomedially in the right middle lobe were not previously positive on PET scan and accordingly are likely benign. The 5 by 6 mm nodule is also stable over the past 2 years. This 2 year stability is compatible with benign process, and based on current guidelines, further follow up surveillance is not required. 2. 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 Recommend annual imaging followup by CTA or MRA.This recommendation follows 2010 ACCF/AHA/AATS/ACR/ASA/SCA/SCAI/SIR/STS/SVM Guidelines for the Diagnosis and Management of Patients with Thoracic Aortic Disease. Circulation. 2010; 121: E332-R518 3. Coronary, aortic arch, and branch vessel atherosclerotic vascular disease.  LHC 1/15 LM No significant disease.  LAD: Luminal irregularities.  LCx: serial 50% and 60% stenoses in the mid LCx.  RCA: 50% proximal RCA stenosis.  LVEF is estimated at 55% without significant wall motion abnormalities noted in RAO projection.  Final Conclusions:  I suspect that at this point COPD is the major cause of  his dyspnea.   Echo 12/14 Inferior and distal septal HK, mild LVH, EF 50-55%, mild MR, mild LAE  Past Medical History:    Diagnosis Date  . Arthritis    s/p TKR  . Bladder cancer (HCC)    [facility], [facility] low grade papillary urothileal carcinoma  . BPH (benign prostatic hyperplasia)   . Brugada syndrome    Possible Type II Brugada ECG pattern. No family history of SCD, no syncope, no tachypalpitations.  Marland Kitchen CAD (coronary artery disease)    a. LHC 1/15 - mid LCx 50 and 60%, proximal RCA 50%  . Chronic diastolic CHF (congestive heart failure) ([place])   . CKD (chronic kidney disease)   . COPD (chronic obstructive pulmonary disease) ([place])    Prior heavy smoker. PFTs (3/11): FVC 87%, FEV1 73%, ratio 0.57, DLCO 75%, TLC 121%. Moderate obstructive defect. PFTs (9/15): Only minimal obstruction, sugPrior heavy smoker. PFTs (3/11): FVC 87%, FEV1 73%, ratio 0.57, DLCO 75%, TLC 121%. Moderate obstructive defect. PFTs (9/15) minimal obstruction, poss asthma component   . Depression    with bipolar tendencies  . DOE (dyspnea on exertion)    a. Myoview 8/09- EF 57%, no ischemia // b. Myoview (3/11) EF 68%, diaphragmatic attenuation, no ischemia //  c Echo (4/11) EF 61-44%, mild diastolic dysfunction, PASP 38 mmHg // d. PFTs 9/15 minimal obstruction  /  e. CT negative for ILD  . GERD (gastroesophageal reflux disease)   . History of Doppler ultrasound    Carotid US 3/11 negative for significant stenosis  //  carotid US 6/17: Mild bilateral plaque, 1-39% ICA  . History of echocardiogram    a. Echo 12/14 Inferior and distal septal HK, mild LVH, EF 50-55%, mild MR, mild LAE  //  b. Echo 6/17: Mild focal basal septal hypertrophy, EF 60-65%, normal wall motion, grade 1 diastolic dysfunction, mild AI  . HLD (hyperlipidemia)   . Hypertensive heart disease with CHF (congestive heart failure) ([place]) 04/08/2009  . Low testosterone   . Lung nodule    a. CT in 2015 >> PET in 12/15 not sugg of malignancy   . LVH (left ventricular hypertrophy)    a. Echo 12/14 Inferior and distal septal HK, mild LVH, EF 50-55%, mild MR, mild LAE  . Mild dementia    . Mitral regurgitation    Echo (4/11) with PISA ERO 0.3 cm^2 and regurgitant volume 41 mL (moderate MR). Echo (12/14) with mild MR.   . MS (multiple sclerosis) ([place])   . MS (multiple sclerosis) ([place])   . Right bundle branch block   . Stroke ([place])   . Thoracic aortic aneurysm (HCC)    4.1 cm 2017  . Urinary retention with incomplete bladder emptying    receives botox injections and treatment for BPH through [facility]    Past Surgical History:  Procedure Laterality Date  . APPENDECTOMY    . EP IMPLANTABLE DEVICE N/A 06/23/2015   Procedure: Loop Recorder Insertion;  Surgeon: Deboraha Sprang, MD;  Location: [place] [facility];  Service: Cardiovascular;  Laterality: N/A;  . HEMORRHOID SURGERY    . LEFT HEART CATHETERIZATION WITH CORONARY ANGIOGRAM N/A 01/05/2013   Procedure: LEFT HEART CATHETERIZATION WITH CORONARY ANGIOGRAM;  Surgeon: Larey Dresser, MD;  Location: [facility] CATH [facility];  Service: Cardiovascular;  Laterality: N/A;  . ROTATOR CUFF REPAIR    . TEE WITHOUT CARDIOVERSION N/A 06/23/2015   Procedure: TRANSESOPHAGEAL ECHOCARDIOGRAM (TEE);  Surgeon: Fay Records, MD;  Location: [facility];  Service: Cardiovascular;  Laterality: N/A;  . TONSILLECTOMY    . TOTAL KNEE ARTHROPLASTY Right   . TRANSURETHRAL RESECTION OF PROSTATE      Current Medications: Current Meds  Medication Sig  . acetaminophen (TYLENOL) 325 MG tablet Take 650 mg by mouth every 6 (six) hours as needed (for pain or headaches).   Marland Kitchen apixaban (ELIQUIS) 5 MG TABS tablet Take 1 tablet (5 mg total) by mouth 2 (two) times daily.  Marland Kitchen atorvastatin (LIPITOR) 40 MG tablet Take 1 tablet (40 mg total) by mouth daily.  . bisoprolol (ZEBETA) 10 MG tablet Take 1 tablet (10 mg total) by mouth daily.  . cholecalciferol (VITAMIN D) 400 units TABS tablet Take 400 Units by mouth daily.   . Cyanocobalamin (B-12) 1000 MCG CAPS Take 1,000 mcg by mouth daily.   Marland Kitchen doxazosin (CARDURA) 2 MG tablet Take 1 tablet (2 mg total) by mouth daily.  . folic  acid (FOLVITE) 1 MG tablet Take 1 mg by mouth daily.  . furosemide (LASIX) 40 MG tablet Take 40 mg by mouth daily.  Marland Kitchen HYDROcodone-acetaminophen (NORCO) 10-325 MG tablet Take 1 tablet by mouth every 6 (six) hours as needed for moderate pain or severe pain.  Marland Kitchen MAGNESIUM CITRATE PO Take 0.5-1 Bottles by mouth once as needed (for mild constipation).   . Multiple Vitamins-Minerals (DRY EYE FORMULA) CAPS Place 1-2 drops into both eyes 4 (four) times daily as needed (for dry eyes).  . pantoprazole (PROTONIX) 40 MG tablet Take 1 tablet (40 mg total) by mouth daily.  Marland Kitchen PROAIR HFA 108 (90 Base) MCG/ACT inhaler USE 1 TO 2 INHALATIONS EVERY 6 HOURS AS NEEDED FOR WHEEZING OR SHORTNESS OF BREATH  . rOPINIRole (REQUIP) 1 MG tablet TAKE 1 TABLET AT BEDTIME  . Tiotropium Bromide Monohydrate (SPIRIVA RESPIMAT) 2.5 MCG/ACT AERS Inhale 2 puffs into the lungs daily.  Marland Kitchen venlafaxine XR (EFFEXOR-XR) 150 MG 24 hr capsule TAKE 1 CAPSULE TWICE A DAY  . [DISCONTINUED] losartan (COZAAR) 50 MG tablet Take 1 tablet (50 mg total) by mouth daily.     Allergies:   Acetylcholine; Alcohol-sulfur [sulfur]; Amitriptyline; Bupivacaine; Clomipramine hcl; Cocaine; Desipramine; Ergonovine; Flecainide; Lithium; Loxapine; Nortriptyline; Oxcarbazepine; Procainamide; Procaine; Propafenone; Propofol; and Trifluoperazine   Social History   Social History  . Marital status: Married    Spouse name: N/A  . Number of children: N/A  . Years of education: N/A   Occupational History  . retired Retired   Social History Main Topics  . Smoking status: Former Smoker    Packs/day: 2.00    Years: 40.00    Types: Cigarettes    Quit date: 01/04/1993  . Smokeless tobacco: Former Systems developer    Types: Chew  . Alcohol use 15.0 oz/week    25 Cans of beer per week     Comment: 12 pack a week  . Drug use: No  . Sexual activity: Not Asked   Other Topics Concern  . None   Social History Narrative   Retired from the [employer] after 20 years    [place] Veteran     Family Hx: The patient's family history includes Arthritis in his brother; Hypertension in his sister and unknown relative; Kidney cancer in his brother; Other in his brother; Stroke in his father and mother. There is no history of Prostate cancer or Bladder Cancer.  ROS:   Please see the history of present illness.    Review of Systems  Hematologic/Lymphatic: Bruises/bleeds easily.   All other systems reviewed and are negative.  EKGs/Labs/Other Test Reviewed:    EKG:  EKG is  ordered today.  The ekg ordered today demonstrates NSR, HR 68, left axis deviation, right bundle branch block, QTc 410 ms, similar to prior tracings  Recent Labs: 04/07/2016: NT-Pro BNP 117 06/21/2016: ALT 16 06/23/2016: BUN 14; Creatinine, Ser 1.08; Hemoglobin 13.5; Platelets 262; Potassium 4.0; Sodium 134   Recent Lipid Panel Lab Results  Component Value Date/Time   CHOL 122 06/22/2016 06:07 AM   TRIG 220 (H) 06/22/2016 06:07 AM   HDL 42 06/22/2016 06:07 AM   CHOLHDL 2.9 06/22/2016 06:07 AM   LDLCALC 36 06/22/2016 06:07 AM   LDLDIRECT 87.0 02/01/2014 10:19 AM    Physical Exam:    VS:  BP 140/60   Pulse 68   Ht 5\' 11"  (1.803 m)   Wt 220 lb 12.8 oz (100.2 kg)   SpO2 97%   BMI 30.80 kg/m     Wt Readings from Last 3 Encounters:  08/18/16 220 lb 12.8 oz (100.2 kg)  07/20/16 216 lb (98 kg)  07/12/16 212 lb (96.2 kg)     Physical Exam  Constitutional: He is oriented to person, place, and time. He appears well-developed and well-nourished. No distress.  HENT:  Head: Normocephalic and atraumatic.  Eyes: No scleral icterus.  Neck: Normal range of motion. No JVD present.  Cardiovascular: Normal rate, regular rhythm, S1 normal and S2 normal.   No murmur heard. Pulmonary/Chest: Effort normal and breath sounds normal. He has no wheezes. He has no rhonchi. He has no rales.  Abdominal: Soft. There is no hepatomegaly.  Musculoskeletal: He exhibits edema (trace-1+ bilateral LE edema).   Neurological: He is alert and oriented to person, place, and time.  Skin: Skin is warm and dry.  Psychiatric: He has a normal mood and affect.    ASSESSMENT:    1. Chronic diastolic CHF (congestive heart failure) ([place])   2. History of stroke   3. Coronary artery disease involving native coronary artery of native heart without angina pectoris   4. Thoracic aortic aneurysm without rupture ([place])   5. Essential hypertension   6. Chronic anticoagulation    PLAN:    In order of problems listed above:  1. Chronic diastolic CHF (congestive heart failure) (HCC) NYHA 2. Continue current therapy.  2. History of stroke He has had recurrent embolic strokes. He has not had atrial fibrillation identified on his ILR. He is now on anticoagulation with apixaban. He seems to be tolerating this well. Follow-up with neurology as planned. Arrange follow-up CBC, BMET.  3. Coronary artery disease involving native coronary artery of native heart without angina pectoris History of moderate nonobstructive CAD on cardiac catheterization in 2015. He denies chest discomfort. His shortness of breath is stable/improved. He is not on aspirin as he is on apixaban. Continue statin.  4. Thoracic aortic aneurysm without rupture (HCC) Stable measurement by recent CT at 4 cm. Annual follow-up is planned.  5. Essential hypertension Blood pressure remains above target. Increase losartan to 100 mg daily. BMET 1 week.  6. Chronic anticoagulation Arrange BMET, CBC in 2 weeks.  Dispo:  Return in about 4 months (around 12/18/2016) for Routine Follow Up, w/ Dr. Saunders Revel, or Richardson Dopp, PA-C.   Medication Adjustments/Labs and Tests Ordered: Current medicines are reviewed at length with the patient today.  Concerns regarding medicines are outlined above.  Tests Ordered: Orders Placed This Encounter  Procedures  . Basic Metabolic Panel (BMET)  . CBC  . EKG 12-Lead  Medication Changes: Meds ordered this encounter    Medications  . losartan (COZAAR) 100 MG tablet    Sig: Take 1 tablet (100 mg total) by mouth daily.    Dispense:  90 tablet    Refill:  3    Signed, Richardson Dopp, PA-C  08/18/2016 [facility] [address], [place], [place]  [address] Phone: [phone]; Fax: [phone]

## 2016-08-18 ENCOUNTER — Ambulatory Visit (INDEPENDENT_AMBULATORY_CARE_PROVIDER_SITE_OTHER): Payer: Medicare Other | Admitting: Physician Assistant

## 2016-08-18 ENCOUNTER — Ambulatory Visit: Payer: Medicare Other

## 2016-08-18 ENCOUNTER — Encounter: Payer: Self-pay | Admitting: Physician Assistant

## 2016-08-18 VITALS — BP 140/60 | HR 68 | Ht 71.0 in | Wt 220.8 lb

## 2016-08-18 DIAGNOSIS — Z8673 Personal history of transient ischemic attack (TIA), and cerebral infarction without residual deficits: Secondary | ICD-10-CM | POA: Diagnosis not present

## 2016-08-18 DIAGNOSIS — Z7901 Long term (current) use of anticoagulants: Secondary | ICD-10-CM | POA: Diagnosis not present

## 2016-08-18 DIAGNOSIS — I5032 Chronic diastolic (congestive) heart failure: Secondary | ICD-10-CM | POA: Diagnosis not present

## 2016-08-18 DIAGNOSIS — I2510 Atherosclerotic heart disease of native coronary artery without angina pectoris: Secondary | ICD-10-CM | POA: Diagnosis not present

## 2016-08-18 DIAGNOSIS — I10 Essential (primary) hypertension: Secondary | ICD-10-CM | POA: Diagnosis not present

## 2016-08-18 DIAGNOSIS — I712 Thoracic aortic aneurysm, without rupture, unspecified: Secondary | ICD-10-CM

## 2016-08-18 DIAGNOSIS — R41841 Cognitive communication deficit: Secondary | ICD-10-CM

## 2016-08-18 DIAGNOSIS — I639 Cerebral infarction, unspecified: Secondary | ICD-10-CM

## 2016-08-18 MED ORDER — LOSARTAN POTASSIUM 100 MG PO TABS
100.0000 mg | ORAL_TABLET | Freq: Every day | ORAL | 3 refills | Status: DC
Start: 2016-08-18 — End: 2017-12-05

## 2016-08-18 NOTE — Patient Instructions (Addendum)
Medication Instructions:  1. INCREASE LOSARTAN TO 100 MG DAILY; NEW RX HAS BEEN SENT IN  Labwork: 09/03/16 ANYTIME BETWEEN 7:30 AM AND 4:30 PM (BMET, CBC)  Testing/Procedures: NONE ORDERED TODAY  Follow-Up: 12/16/16 @ 1:20 WITH DR. END   KEEP YOUR REMOTE DEVICE CHECK SCHEDULED FOR 08/23/16  KEEP YOUR APPT WITH RENEE URSEY, [place] ON 08/26/16  Any Other Special Instructions Will Be Listed Below (If Applicable).     If you need a refill on your cardiac medications before your next appointment, please call your pharmacy.

## 2016-08-18 NOTE — Patient Instructions (Signed)
  Please complete the assigned speech therapy homework prior to your next session and return it to the speech therapist at your next visit.  

## 2016-08-18 NOTE — Therapy (Signed)
[facility] [address] [place], [place], [address] Phone: [phone]   Fax:  [phone]  Speech Language Pathology Treatment  Patient Details  Name: [name] [name] MRN: [id number] Date of Birth: Jul 20, 1940 Referring Provider: Dr. Rosalin Hawking  Encounter Date: 08/18/2016      End of Session - 08/18/16 1727    Visit Number 9   Number of Visits 17   Date for SLP Re-Evaluation 09/03/16   SLP Start Time 1319   SLP Stop Time  1401   SLP Time Calculation (min) 42 min   Activity Tolerance Patient tolerated treatment well      Past Medical History:  Diagnosis Date  . Arthritis    s/p TKR  . Bladder cancer (HCC)    [facility], [place] low grade papillary urothileal carcinoma  . BPH (benign prostatic hyperplasia)   . Brugada syndrome    Possible Type II Brugada ECG pattern. No family history of SCD, no syncope, no tachypalpitations.  Mar[place] Kitchen CAD (coronary artery disease)    a. LHC 1/15 - mid LCx 50 and 60%, proximal RCA 50%  . Chronic diastolic CHF (congestive heart failure) ([place])   . CKD (chronic kidney disease)   . COPD (chronic obstructive pulmonary disease) ([place])    Prior heavy smoker. PFTs (3/11): FVC 87%, FEV1 73%, ratio 0.57, DLCO 75%, TLC 121%. Moderate obstructive defect. PFTs (9/15): Only minimal obstruction, sugPrior heavy smoker. PFTs (3/11): FVC 87%, FEV1 73%, ratio 0.57, DLCO 75%, TLC 121%. Moderate obstructive defect. PFTs (9/15) minimal obstruction, poss asthma component   . Depression    with bipolar tendencies  . DOE (dyspnea on exertion)    a. Myoview 8/09- EF 57%, no ischemia // b. Myoview (3/11) EF 68%, diaphragmatic attenuation, no ischemia //  c Echo (4/11) EF 12-87%, mild diastolic dysfunction, PASP 38 mmHg // d. PFTs 9/15 minimal obstruction  /  e. CT negative for ILD  . GERD (gastroesophageal reflux disease)   . History of Doppler ultrasound    Carotid US 3/11 negative for significant stenosis  //  carotid  US 6/17: Mild bilateral plaque, 1-39% ICA  . History of echocardiogram    a. Echo 12/14 Inferior and distal septal HK, mild LVH, EF 50-55%, mild MR, mild LAE  //  b. Echo 6/17: Mild focal basal septal hypertrophy, EF 60-65%, normal wall motion, grade 1 diastolic dysfunction, mild AI  . HLD (hyperlipidemia)   . Hypertensive heart disease with CHF (congestive heart failure) ([place]) 04/08/2009  . Low testosterone   . Lung nodule    a. CT in 2015 >> PET in 12/15 not sugg of malignancy   . LVH (left ventricular hypertrophy)    a. Echo 12/14 Inferior and distal septal HK, mild LVH, EF 50-55%, mild MR, mild LAE  . Mild dementia   . Mitral regurgitation    Echo (4/11) with PISA ERO 0.3 cm^2 and regurgitant volume 41 mL (moderate MR). Echo (12/14) with mild MR.   . MS (multiple sclerosis) ([place])   . MS (multiple sclerosis) ([place])   . Right bundle branch block   . Stroke ([place])   . Thoracic aortic aneurysm (HCC)    4.1 cm 2017  . Urinary retention with incomplete bladder emptying    receives botox injections and treatment for BPH through [facility]    Past Surgical History:  Procedure Laterality Date  . APPENDECTOMY    . EP IMPLANTABLE DEVICE N/A 06/23/2015   Procedure: Loop Recorder Insertion;  Surgeon: Remo Lipps  Peterson Lombard, MD;  Location: [facility];  Service: Cardiovascular;  Laterality: N/A;  . HEMORRHOID SURGERY    . LEFT HEART CATHETERIZATION WITH CORONARY ANGIOGRAM N/A 01/05/2013   Procedure: LEFT HEART CATHETERIZATION WITH CORONARY ANGIOGRAM;  Surgeon: Larey Dresser, MD;  Location: [facility] CATH LAB;  Service: Cardiovascular;  Laterality: N/A;  . ROTATOR CUFF REPAIR    . TEE WITHOUT CARDIOVERSION N/A 06/23/2015   Procedure: TRANSESOPHAGEAL ECHOCARDIOGRAM (TEE);  Surgeon: Fay Records, MD;  Location: [facility] ENDOSCOPY;  Service: Cardiovascular;  Laterality: N/A;  . TONSILLECTOMY    . TOTAL KNEE ARTHROPLASTY Right   . TRANSURETHRAL RESECTION OF PROSTATE      There were no vitals filed for this  visit.      Subjective Assessment - 08/18/16 1323    Subjective "We left his papers at home." -wife   Patient is accompained by: Family member  wife   Currently in Pain? No/denies               ADULT SLP TREATMENT - 08/18/16 1324      General Information   Behavior/Cognition Distractible;Confused;Cooperative;Pleasant mood;Decreased sustained attention;Requires cueing;Hard of hearing     Treatment Provided   Treatment provided Cognitive-Linquistic     Cognitive-Linquistic Treatment   Treatment focused on Cognition   Skilled Treatment Wife told SLP that pt had exceedingly difficult time with homework tasks - took average of one hour each. SLP provided pt with 7 "last names" to alphabetize; pt did so in 15 seconds. Added 4 more names, and then 4 more names, and pt held attention for up to 2 minutes alphabetizing the names. Pt with emergent awareness with 15 names, and with 7 names. In simple sustained attention task (reading directions and following it) with consistent max A for sustained attention to write item directed by the instruction, and max A usually for alternating attention for next instruction instead of reading previous instruction. Reduced emergent awareness noted.     Assessment / Recommendations / Plan   Plan Continue with current plan of care     Progression Toward Goals   Progression toward goals Progressing toward goals            SLP Short Term Goals - 08/18/16 1729      SLP SHORT TERM GOAL #1   Title Pt will implement functional system to facilitate recall, using it at least 2x/week between therapies.   Status Not Met     SLP SHORT TERM GOAL #2   Title Pt will verbalize 3 cognitive deficits given min cues   Status Achieved     SLP SHORT TERM GOAL #3   Title Pt will complete simple organization, reasoning, problem solving tasks with 80% accuracy given mod assist   Status Not Met     SLP SHORT TERM GOAL #4   Title pt will complete cognitive  tasks at home to improve cognitive function, 80% of days between sessions   Status Partially Met          SLP Long Term Goals - 08/18/16 1730      SLP LONG TERM GOAL #1   Title Pt/wife will report improved functional recall of appointments, medications, events, activities using identified strategy, recalling 2+ events between therapy sessions with min reminders from wife.   Time 5   Period Weeks   Status On-going     SLP LONG TERM GOAL #2   Title Pt will verbalize at least 2 compensatory strategies for each deficit identified.  Status Deferred  too difficult for pt     SLP LONG TERM GOAL #3   Title Pt will complete mod complex thought organization, reasoning, problem solving tasks with 80% accuracy given min assist.   Time 4   Period Weeks   Status Revised     SLP LONG TERM GOAL #4   Title Pt will accurately load med organizer with min-mod cues occasionally   Time 4   Period Weeks   Status Revised     SLP LONG TERM GOAL #5   Title Pt/wife will report pt to require min A usually with medication administration at home   Time 4   Period Weeks   Status Revised     Additional Long Term Goals   Additional Long Term Goals Yes     SLP LONG TERM GOAL #6   Title pt will complete therapy tasks during non-therapy days 80% of the time   Time 4   Period Weeks   Status New          Plan - 08/18/16 1728    Clinical Impression Statement Pt presents today with cont'd significant cognitive communication deficits. See "skilled intervention" for details. Pt agreed, after 3-5 seconds, that his written instruction task would have been easier before his CVA. Pt would cont to benefit from continued skilled ST intervention, only if pt's intellectual awareness improves, to maximize independence and safety, and decreased caregiver burden.   Speech Therapy Frequency 2x / week   Duration --  8 weeks   Treatment/Interventions Environmental controls;Cueing hierarchy;SLP instruction and  feedback;Compensatory strategies;Functional tasks;Cognitive reorganization;Patient/family education;Multimodal communcation approach;Internal/external aids   Potential to Achieve Goals Fair   Potential Considerations Ability to learn/carryover information;Family/community support;Previous level of function;Cooperation/participation level;Severity of impairments   SLP Home Exercise Plan homework assignments given   Consulted and Agree with Plan of Care Patient;Family member/caregiver   Family Member Consulted wife carolyn      Patient will benefit from skilled therapeutic intervention in order to improve the following deficits and impairments:   Cognitive communication deficit    Problem List Patient Active Problem List   Diagnosis Date Noted  . 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 Acute ischemic stroke ([place]) 06/22/2016  . Stroke (cerebrum) ([place]) 06/21/2016  . Expressive aphasia   . Essential hypertension 05/17/2016  . Family history of stroke 04/06/2016  . Cognitive deficits 04/06/2016  . Cerebrovascular accident (CVA) due to embolism of cerebral artery ([place]) 04/04/2016  . Syncope and collapse   . Dysarthria   . Acute cystitis without hematuria   . History of ischemic right MCA stroke   . Slurred speech 06/20/2015  . AKI (acute kidney injury) ([place]) 06/20/2015  . Thoracic aortic aneurysm ([place])   . Hyperlipidemia 06/05/2015  . Gastroesophageal reflux disease without esophagitis 06/05/2015  . COPD  GOLD 0  09/04/2013  . Lung nodule 09/04/2013  . Cancer ([place])   . Coronary artery disease involving native coronary artery of native heart without angina pectoris 02/01/2013  . Chronic diastolic CHF (congestive heart failure) ([place]) 01/03/2013  . Depression   . Urinary retention with incomplete bladder emptying   . BPH (benign prostatic hyperplasia)   . Brugada syndrome 08/23/2011  . Hypertensive heart disease with CHF (congestive heart failure) ([place]) 04/08/2009  . Mitral valve disorder  04/08/2009  . CAROTID BRUIT, RIGHT 03/06/2009  . SHORTNESS OF BREATH 03/06/2009  . OTHER CHEST PAIN 03/06/2009  . Possible Multiple sclerosis ([place]) 10/11/2006    [facility] ,[place], [place]  08/18/2016,  5:34 PM  [facility] [address] [place], [place], [address] Phone: [phone]   Fax:  [phone]   Name: [name] [name] MRN: [id number] Date of Birth: Jul 22, 1940

## 2016-08-20 ENCOUNTER — Ambulatory Visit: Payer: Medicare Other

## 2016-08-20 DIAGNOSIS — R41841 Cognitive communication deficit: Secondary | ICD-10-CM | POA: Diagnosis not present

## 2016-08-23 ENCOUNTER — Ambulatory Visit (INDEPENDENT_AMBULATORY_CARE_PROVIDER_SITE_OTHER): Payer: Medicare Other | Admitting: *Deleted

## 2016-08-23 DIAGNOSIS — I639 Cerebral infarction, unspecified: Secondary | ICD-10-CM

## 2016-08-23 NOTE — Therapy (Signed)
[facility] [address] [place], [place], [address] Phone: [phone]   Fax:  [phone]  Speech Language Pathology Treatment  Patient Details  Name: [name] [name] MRN: [id number] Date of Birth: [birth year] Referring Provider: Dr. Rosalin Hawking  Encounter Date: 08/20/2016      End of Session - 08/23/16 2255    Visit Number 10   Number of Visits 17   Date for SLP Re-Evaluation 09/03/16   SLP Start Time 1607   SLP Stop Time  1101   SLP Time Calculation (min) 40 min      Past Medical History:  Diagnosis Date  . Arthritis    s/p TKR  . Bladder cancer (HCC)    [facility], [place] low grade papillary urothileal carcinoma  . BPH (benign prostatic hyperplasia)   . Brugada syndrome    Possible Type II Brugada ECG pattern. No family history of SCD, no syncope, no tachypalpitations.  [place] Kitchen CAD (coronary artery disease)    a. LHC 1/15 - mid LCx 50 and 60%, proximal RCA 50%  . Chronic diastolic CHF (congestive heart failure) ([place])   . CKD (chronic kidney disease)   . COPD (chronic obstructive pulmonary disease) ([place])    Prior heavy smoker. PFTs (3/11): FVC 87%, FEV1 73%, ratio 0.57, DLCO 75%, TLC 121%. Moderate obstructive defect. PFTs (9/15): Only minimal obstruction, sugPrior heavy smoker. PFTs (3/11): FVC 87%, FEV1 73%, ratio 0.57, DLCO 75%, TLC 121%. Moderate obstructive defect. PFTs (9/15) minimal obstruction, poss asthma component   . Depression    with bipolar tendencies  . DOE (dyspnea on exertion)    a. Myoview 8/09- EF 57%, no ischemia // b. Myoview (3/11) EF 68%, diaphragmatic attenuation, no ischemia //  c Echo (4/11) EF 37-10%, mild diastolic dysfunction, PASP 38 mmHg // d. PFTs 9/15 minimal obstruction  /  e. CT negative for ILD  . GERD (gastroesophageal reflux disease)   . History of Doppler ultrasound    Carotid US 3/11 negative for significant stenosis  //  carotid US 6/17: Mild bilateral plaque, 1-39% ICA  . History  of echocardiogram    a. Echo 12/14 Inferior and distal septal HK, mild LVH, EF 50-55%, mild MR, mild LAE  //  b. Echo 6/17: Mild focal basal septal hypertrophy, EF 60-65%, normal wall motion, grade 1 diastolic dysfunction, mild AI  . HLD (hyperlipidemia)   . Hypertensive heart disease with CHF (congestive heart failure) ([place]) 04/08/2009  . Low testosterone   . Lung nodule    a. CT in 2015 >> PET in 12/15 not sugg of malignancy   . LVH (left ventricular hypertrophy)    a. Echo 12/14 Inferior and distal septal HK, mild LVH, EF 50-55%, mild MR, mild LAE  . Mild dementia   . Mitral regurgitation    Echo (4/11) with PISA ERO 0.3 cm^2 and regurgitant volume 41 mL (moderate MR). Echo (12/14) with mild MR.   . MS (multiple sclerosis) ([place])   . MS (multiple sclerosis) ([place])   . Right bundle branch block   . Stroke ([place])   . Thoracic aortic aneurysm (HCC)    4.1 cm 2017  . Urinary retention with incomplete bladder emptying    receives botox injections and treatment for BPH through [facility]    Past Surgical History:  Procedure Laterality Date  . APPENDECTOMY    . EP IMPLANTABLE DEVICE N/A 06/23/2015   Procedure: Loop Recorder Insertion;  Surgeon: Deboraha Sprang, MD;  Location: [facility]  LAB;  Service: Cardiovascular;  Laterality: N/A;  . HEMORRHOID SURGERY    . LEFT HEART CATHETERIZATION WITH CORONARY ANGIOGRAM N/A 01/05/2013   Procedure: LEFT HEART CATHETERIZATION WITH CORONARY ANGIOGRAM;  Surgeon: Larey Dresser, MD;  Location[facility] CATH LAB;  Service: Cardiovascular;  Laterality: N/A;  . ROTATOR CUFF REPAIR    . TEE WITHOUT CARDIOVERSION N/A 06/23/2015   Procedure: TRANSESOPHAGEAL ECHOCARDIOGRAM (TEE);  Surgeon: Fay Records, MD;  Location: [facility] ENDOSCOPY;  Service: Cardiovascular;  Laterality: N/A;  . TONSILLECTOMY    . TOTAL KNEE ARTHROPLASTY Right   . TRANSURETHRAL RESECTION OF PROSTATE      There were no vitals filed for this visit.             ADULT SLP TREATMENT - 08/23/16  0001      General Information   Behavior/Cognition Distractible;Confused;Cooperative;Pleasant mood;Decreased sustained attention;Requires cueing;Hard of hearing     Treatment Provided   Treatment provided Cognitive-Linquistic     Cognitive-Linquistic Treatment   Treatment focused on Cognition   Skilled Treatment Pt had a better time with homework this time, "he breezed riht through it" wife stated. Upon checking pt's answers however pt demo'd reduced emerent awareness. When reviewing with pt he caught 4/6 errors. SLP reiterated pt must work through sheet again to double check. SLP also educated pt that part of reason he is not seeing errors is reduced attention/concentration. Pt asking wife for recent event info throuhtout session; SLP used this example as well to show pt reduced attention sometimes fosters reduced memory and SLP explained why.     Assessment / Recommendations / Plan   Plan Continue with current plan of care     Progression Toward Goals   Progression toward goals Progressing toward goals          SLP Education - 08/23/16 2254    Education provided Yes   Education Details deficit areas, attention skills are linked to ability to remember   Person(s) Educated Patient;Spouse   Methods Explanation   Comprehension Verbalized understanding;Need further instruction          SLP Short Term Goals - 08/23/16 2257      SLP SHORT TERM GOAL #1   Title Pt will implement functional system to facilitate recall, using it at least 2x/week between therapies.   Status Not Met     SLP SHORT TERM GOAL #2   Title Pt will verbalize 3 cognitive deficits given min cues   Status Achieved     SLP SHORT TERM GOAL #3   Title Pt will complete simple organization, reasoning, problem solving tasks with 80% accuracy given mod assist   Status Not Met     SLP SHORT TERM GOAL #4   Title pt will complete cognitive tasks at home to improve cognitive function, 80% of days between sessions    Status Partially Met          SLP Long Term Goals - 08/23/16 2257      SLP LONG TERM GOAL #1   Title Pt/wife will report improved functional recall of appointments, medications, events, activities using identified strategy, recalling 2+ events between therapy sessions with min reminders from wife.   Time 5   Period Weeks   Status On-going     SLP LONG TERM GOAL #2   Title Pt will verbalize at least 2 compensatory strategies for memory skills with modified independence   Time 4   Status Revised  too difficult for pt  SLP LONG TERM GOAL #3   Title Pt will complete mod complex thought organization, reasoning, problem solving tasks with 80% accuracy given min assist.   Time 4   Period Weeks   Status On-going     SLP LONG TERM GOAL #4   Title Pt will accurately load med organizer with min-mod cues occasionally   Time 4   Period Weeks   Status On-going     SLP LONG TERM GOAL #5   Title Pt/wife will report pt to require min A usually with medication administration at home   Time 4   Period Weeks   Status On-going     SLP LONG TERM GOAL #6   Title pt will complete therapy tasks during non-therapy days 80% of the time   Time 4   Period Weeks   Status On-going          Plan - 08/23/16 2256    Clinical Impression Statement Pt presents today with cont'd significant cognitive communication deficits. See "skilled intervention" for details.  Pt would cont to benefit from continued skilled ST intervention, only if pt's intellectual awareness improves, to maximize independence and safety, and decreased caregiver burden.   Speech Therapy Frequency 2x / week   Duration --  8 weeks   Treatment/Interventions Environmental controls;Cueing hierarchy;SLP instruction and feedback;Compensatory strategies;Functional tasks;Cognitive reorganization;Patient/family education;Multimodal communcation approach;Internal/external aids   Potential to Achieve Goals Fair   Potential Considerations  Ability to learn/carryover information;Family/community support;Previous level of function;Cooperation/participation level;Severity of impairments   SLP Home Exercise Plan homework assignments given   Consulted and Agree with Plan of Care Patient;Family member/caregiver   Family Member Consulted wife carolyn      Patient will benefit from skilled therapeutic intervention in order to improve the following deficits and impairments:   Cognitive communication deficit      G-Codes - 11-Sep-2016 2300    Functional Assessment Tool Used asha noms, skilled clinical judgment   Functional Limitations Memory   Memory Current Status [id number]) At least 40 percent but less than 60 percent impaired, limited or restricted   Memory Goal Status ([id number]) At least 20 percent but less than 40 percent impaired, limited or restricted      Speech Therapy Progress Note  Dates of Reporting Period: eval date to present  Objective Reports of Subjective Statement: Pt has been seen for 10 visits of cognitive communicaiton therapy in approx 8 weeks. He was on vacation out [place] for a portion of this reporting period   Objective Measurements: See "skilled intervention" and "clinical impressions" for details but in sum, pt has made very little progress mostly due to his significant attention deficits.  Goal Update: See "goals" abvoe  Plan: Pt will be seen for approx 8 more visits to attempt to maximize pt ability and reduce caregiver burden.  Reason Skilled Services are Required: Pt has not been seen for his suggested plan of care, approx 17 visits.   Problem List Patient Active Problem List   Diagnosis Date Noted  . 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 Acute ischemic stroke ([place]) 06/22/2016  . Stroke (cerebrum) ([place]) 06/21/2016  . Expressive aphasia   . Essential hypertension 05/17/2016  . Family history of stroke 04/06/2016  . Cognitive deficits 04/06/2016  . Cerebrovascular accident (CVA) due to embolism of cerebral  artery ([place]) 04/04/2016  . Syncope and collapse   . Dysarthria   . Acute cystitis without hematuria   . History of ischemic right MCA stroke   . Slurred speech  06/20/2015  . AKI (acute kidney injury) ([place]) 06/20/2015  . Thoracic aortic aneurysm ([place])   . Hyperlipidemia 06/05/2015  . Gastroesophageal reflux disease without esophagitis 06/05/2015  . COPD  GOLD 0  09/04/2013  . Lung nodule 09/04/2013  . Cancer ([place])   . Coronary artery disease involving native coronary artery of native heart without angina pectoris 02/01/2013  . Chronic diastolic CHF (congestive heart failure) ([place]) 01/03/2013  . Depression   . Urinary retention with incomplete bladder emptying   . BPH (benign prostatic hyperplasia)   . Brugada syndrome 08/23/2011  . Hypertensive heart disease with CHF (congestive heart failure) ([place]) 04/08/2009  . Mitral valve disorder 04/08/2009  . CAROTID BRUIT, RIGHT 03/06/2009  . SHORTNESS OF BREATH 03/06/2009  . OTHER CHEST PAIN 03/06/2009  . Possible Multiple sclerosis ([place]) 10/11/2006    [facility] ,[place], [place]  08/23/2016, 11:03 PM  [facility] [address] [place], [place], [address] Phone: [phone]   Fax:  [phone]   Name: [name] [name] MRN: [id number] Date of Birth: [birth year]

## 2016-08-23 NOTE — Patient Instructions (Signed)
  Please complete the assigned speech therapy homework prior to your next session and return it to the speech therapist at your next visit. Please CHECK YOUR ANSWERS.

## 2016-08-24 ENCOUNTER — Encounter: Payer: Self-pay | Admitting: Neurology

## 2016-08-24 ENCOUNTER — Ambulatory Visit (INDEPENDENT_AMBULATORY_CARE_PROVIDER_SITE_OTHER): Payer: Medicare Other | Admitting: Neurology

## 2016-08-24 ENCOUNTER — Ambulatory Visit: Payer: Medicare Other

## 2016-08-24 VITALS — BP 89/59 | Ht 71.0 in | Wt 221.8 lb

## 2016-08-24 DIAGNOSIS — E785 Hyperlipidemia, unspecified: Secondary | ICD-10-CM | POA: Diagnosis not present

## 2016-08-24 DIAGNOSIS — I10 Essential (primary) hypertension: Secondary | ICD-10-CM | POA: Diagnosis not present

## 2016-08-24 DIAGNOSIS — I639 Cerebral infarction, unspecified: Secondary | ICD-10-CM

## 2016-08-24 DIAGNOSIS — I6340 Cerebral infarction due to embolism of unspecified cerebral artery: Secondary | ICD-10-CM

## 2016-08-24 DIAGNOSIS — C680 Malignant neoplasm of urethra: Secondary | ICD-10-CM | POA: Diagnosis not present

## 2016-08-24 DIAGNOSIS — Z8673 Personal history of transient ischemic attack (TIA), and cerebral infarction without residual deficits: Secondary | ICD-10-CM | POA: Diagnosis not present

## 2016-08-24 NOTE — Progress Notes (Signed)
STROKE NEUROLOGY FOLLOW UP NOTE  NAME: [name] [name] DOB: January 13, 1940  REASON FOR VISIT: stroke follow up HISTORY FROM: pt and wife and chart  Today we had the pleasure of seeing [name] [name] in follow-up at our [facility]. Pt was accompanied by wife.   History Summary [name] [name] is a 76 y.o. male with history of MS diagnosis in 1963 not on meds, CAD, thoracic AA, history of lung nodule benign, urethral carcinoma following with [facility], diastolic CHF, HTN, HLD and GERD admitted on 06/15/2015 for slurred speech. 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 CT head, carotid Doppler, TTE were all negative. LDL 91 and A1c 6.4. His aspirin 81 changed to 325, resumed Lipitor 20, and he was discharged home outpatient PT OT and plan with outpatient TEE and Loop recorder. However, he was readmitted on 06/20/2015 for presyncope with UTI, generalized weakness, dysarthria and gait instability. MRI showed essentially the same infarct as last admission. Symptoms consider as recrudescence of previous stroke in the setting of infection and presyncope. DVT and TEE negative without PFO. Loop recorder placed. Aspirin was changed to Plavix on discharge.  Follow up 08/14/15 - the patient has been doing well. Wife complains of mild memory loss after stroke. Blood pressure today 105/71. On Plavix and Lipitor, without side effect. Not candidate for RESPECT ESUS due to urethral cancer.     Follow up 11/17/15 - he has been doing well. Had loop recorder placed but has some signal problems and no report since July. Pt and wife are contacting cardiology. On plavix and lipitor and no side effect. Today BP 123/80 in clinic but at home still high between 140-155. He is on 3 BP meds, following with PCP.   Follow up 05/17/16 - pt was admitted on 04/04/16 for R sided weakness. HereceivedIV t-PA. MRI showed small right cerebellar and right occipital lobe  with left ACA punctate infarct, again embolic pattern. CTA head and neck only showed left VA stenosis. EF 55-60%. TCD bubble study negative for PFO, and loop recorder showed no afib. LDL 61 and A1C 5.6. His plavix changed to aggrenox on discharge. CTA chest showed fusiform dilatation of ascending aorta and mild to moderate thoracic descending aorta atherosclerosis.  He follows with [facility] for urethral cancer and recurrent UTI. So far no recurrence or metastasis found. He has appointment with them in 07/2016.  Hx of stroke  06/15/2015 for right MCA infarcts at right insular and posterior frontal lobe. CT head, carotid Doppler, TTE were all negative. LDL 91 and A1c 6.4. His aspirin 81 changed to 325, resumed Lipitor 20  06/20/2015 recrudescence of previous stroke in the setting of infection and presyncope. DVT and TEE negative without PFO. Loop recorder placed. Aspirin was changed to Plavix on discharge.   04/04/16 small right cerebellar and right occipital lobe with left ACA punctate infarct s/p tPA, again embolic pattern. CTA head and neck only showed left VA stenosis. EF 55-60%. TCD bubble study negative for PFO, and loop recorder showed no afib. LDL 61 and A1C 5.6. His plavix changed to aggrenox on discharge. CTA chest showed fusiform dilatation of ascending aorta and mild to moderate thoracic descending aorta atherosclerosis, which can not explain the stroke.  Interval History Pt was admitted on 06/21/16 for aphasia and left facial droop. MRI showed left MCA acute left lateral parietal and left superior parietal infarcts, embolic pattern. MRA, TTE and DVT negative.  Loop recorder again no afib. Pan CT no evidence of cancer metastasis. LDL 36 and A1C 5.6. After discussion with pt and family, we started eliquis 5mg  bid for stroke prevention.   During the interval time, pt has been doing well. Speech improved with home self exercise. However, BP was low today 89/59, asymptomatic. Usually  his BP at home 110-120s. Today also mildly sleepy and wife stated that he has insomnia.   REVIEW OF SYSTEMS: Full 14 system review of systems performed and notable only for those listed below and in HPI above, all others are negative:  Constitutional:   Cardiovascular: Ear/Nose/Throat:  Hearing loss Skin:  Eyes:   Respiratory:  SOB, wheezing Gastroitestinal:  Constipation Genitourinary: incontinence of bladder Hematology/Lymphatic:  Bleeding easily Endocrine: Cold intolerance Musculoskeletal:   Allergy/Immunology:   Neurological:  Memory loss, HA, speech difficulty Psychiatric: confusion, decreased concentration Sleep: Restless leg, frequent waking  The following represents the patient's updated allergies and side effects list: Allergies  Allergen Reactions  . Acetylcholine     Patient was suspected of having "Brugada Syndrome": (BrS) is a genetic condition that results in abnormal electrical activity within the heart, increasing the risk of sudden cardiac death. Those affected may have episodes of passing out.  Marland Kitchen Alcohol-Sulfur [Sulfur] Other (See Comments)    Patient was suspected of having "Brugada Syndrome"  . Amitriptyline Other (See Comments)    Patient was suspected of having "Brugada Syndrome"  . Bupivacaine Other (See Comments)    Patient was suspected of having "Brugada Syndrome"  . Clomipramine Hcl Other (See Comments)    Patient was suspected of having "Brugada Syndrome"  . Cocaine Other (See Comments)    Patient was suspected of having "Brugada Syndrome"  . Desipramine Other (See Comments)    Patient was suspected of having "Brugada Syndrome"  . Ergonovine Other (See Comments)    Patient was suspected of having "Brugada Syndrome"  . Flecainide Other (See Comments)    Patient was suspected of having "Brugada Syndrome"  . Lithium Other (See Comments)    Patient was suspected of having "Brugada Syndrome"  . Loxapine Other (See Comments)    Patient was suspected of  having "Brugada Syndrome"  . Nortriptyline Other (See Comments)    Patient was suspected of having "Brugada Syndrome"  . Oxcarbazepine Other (See Comments)    Patient was suspected of having "Brugada Syndrome"  . Procainamide Other (See Comments)    Patient was suspected of having "Brugada Syndrome"  . Procaine Other (See Comments)    Patient was suspected of having "Brugada Syndrome"  . Propafenone Other (See Comments)    Patient was suspected of having "Brugada Syndrome"  . Propofol Other (See Comments)    Patient was suspected of having "Brugada Syndrome"  . Trifluoperazine Other (See Comments)    Patient was suspected of having "Brugada Syndrome"    The neurologically relevant items on the patient's problem list were reviewed on today's visit.  Neurologic Examination  A problem focused neurological exam (12 or more points of the single system neurologic examination, vital signs counts as 1 point, cranial nerves count for 8 points) was performed.  Blood pressure (!) 89/59, height 5\' 11"  (1.803 m), weight 221 lb 12.8 oz (100.6 kg).  General - Well nourished, well developed, in no apparent distress.  Ophthalmologic - Sharp disc margins OU.   Cardiovascular - Regular rate and rhythm with no murmur.  Mental Status -  Sleepy but orientation to time, place, and person were intact. Language  including expression, naming, repetition, comprehension was assessed and found intact , mild dysarthria. Not able to backward spelling WORLD, but able to calculate Registration 3/3 and delayed recall 3/3 Fund of knowledge impaired.  Cranial Nerves II - XII - II - Visual field intact OU. III, IV, VI - Extraocular movements intact. V - Facial sensation intact bilaterally. VII - mild right facial asymmetry (chronic after right facial surgery in the past). VIII - Hearing & vestibular intact bilaterally. X - Palate elevates symmetrically, mild dysarthria. XI - Chin turning & shoulder shrug  intact bilaterally. XII - Tongue protrusion intact.  Motor Strength - The patient's strength was normal in all extremities and pronator drift was absent.  Bulk was normal and fasciculations were absent.   Motor Tone - Muscle tone was assessed at the neck and appendages and was normal.  Reflexes - The patient's reflexes were 1+ in all extremities and he had no pathological reflexes.  Sensory - Light touch, temperature/pinprick, vibration and proprioception, and Romberg testing were assessed and were normal.    Coordination - The patient had normal movements in the hands and feet with no ataxia or dysmetria.  Tremor was absent.  Gait and Station - walk without no device, mildly stooped posturing  Data reviewed: I personally reviewed the images and agree with the radiology interpretations.  Ct Head Wo Contrast 06/20/2015   Stable atrophic and ischemic changes. No acute abnormality is noted.   MRI brain 06/15/15 Areas of acute infarction in the region of the insula and posterior frontal lobe on the right consistent with right MCA branch vessel occlusion. No swelling or hemorrhage. Extensive chronic small vessel ischemic changes elsewhere throughout the brain, progressive since the previous exam. Given the history of multiple sclerosis clinically, some of these white matter lesions could relate to chronic demyelination.  [name] [name] Contrast 06/20/2015   Subcentimeter focus of restricted diffusion in the superior RIGHT insula not present previously, representing progression of ischemia. Otherwise stable multifocal areas of acute RIGHT MCA infarction. No interval development of large vessel occlusion, or significant mass effect. Mild postcontrast enhancement, consistent with the subacute time course. There is no significant change on DWI comparing with previous MRI. The "new" DWI signal likely the same DWI as before just due to different cut.   CT Angio Head 06/16/2015 CT HEAD: Evolving  small RIGHT frontal/ MCA territory infarct. Moderate to severe white matter changes better characterized on recent MRI of the brain. CTA HEAD: Negative.  2D Echocardiogram 06/16/15 - Left ventricle: The cavity size was normal. There was mild focal basal hypertrophy of the septum. Systolic function was normal. The estimated ejection fraction was in the range of 60% to 65%. Wall motion was normal; there were no regional wall motion abnormalities. Doppler parameters are consistent with abnormal left ventricular relaxation (grade 1 diastolic dysfunction). - Aortic valve: There was mild regurgitation. Impressions: - No cardiac source of emboli was indentified.  Carotid Doppler 06/16/15 There is 1-39% bilateral ICA stenosis. Vertebral artery flow is antegrade.   LE venous doppler - There is no DVT or SVT noted in the bilateral lower extremities.   TEE - normal EF, no SOE, no PFO  CT chest 06/05/15 1. The nodules are stable from 06/26/2013. More spiculated nodules inferomedially in the right middle lobe were not previously positive on PET scan and accordingly are likely benign. The 5 by 6 mm nodule is also stable over the past 2 years. This 2 year stability is compatible with benign  process, and based on current guidelines, further follow up surveillance is not required. 2. 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 Recommend annual imaging followup by CTA or MRA. 3. Coronary, aortic arch, and branch vessel atherosclerotic vascular disease.  Ct Head Code Stroke W/o Cm 04/04/2016 1. No acute abnormality 2. ASPECTS is 10  Ct Angio Head W Or Wo Contrast Ct Angio Neck W Or Wo Contrast 04/04/2016 No significant carotid artery stenosis. Anterior and middle cerebral arteries patent without significant stenosis or occlusion Severe stenosis origin of the left vertebral artery and mild stenosis origin of the right vertebral artery. Posterior intracranial circulation is patent.   [name] Brain  Wo Contrast 04/05/2016 Subcentimeter acute RIGHT cerebellar and RIGHT occipital lobe infarcts. Old small RIGHT frontal lobe infarct and bold small cerebellar infarcts. Moderate chronic small vessel ischemic disease.   2D Echocardiogram  - Left ventricle: The cavity size was normal. Wall thickness wasnormal. Systolic function was normal. The estimated ejectionfraction was in the range of 55% to 60%. Wall motion was normal;there were no regional wall motion abnormalities. Leftventricular diastolic function parameters were normal, whenadjusted for age. - Aortic valve: There was mild regurgitation. - Mitral valve: There was mild regurgitation.  TCD with bubble study No HITS. No PFO  CT chest 04/28/16 1. Stable fusiform dilatation of the ascending aorta measuring 4.0 cm in maximum diameter. Recommend annual imaging followup by CTA or MRA. This recommendation follows 2010 ACCF/AHA/AATS/ACR/ASA/SCA/SCAI/SIR/STS/SVM Guidelines for the Diagnosis and Management of Patients with Thoracic Aortic Disease. Circulation. 2010; 121: I696-E952. 2. Moderate thoracic aortic atherosclerosis. 3. Coronary artery calcifications. 4. Stable pulmonary nodules on the right consistent with prior inflammation and probable subsequent scarring. No new or enlarging pulmonary nodule is seen.  Ct Head Wo Contrast  06/21/2016 IMPRESSION: Chronic ischemic changes and mild atrophic changes similar to that seen on previous exam. No acute abnormality is identified.   Ct Chest W Contrast Ct Abdomen Pelvis W Contrast 06/22/2016 IMPRESSION: 1. Solid nodule within the perifissural right middle lobe is stable from previous exam. The nodular densities within the right middle lobe have decreased in size when compared with 06/05/15 favoring a benign process. 2. Borderline enlarged sub- carinal lymph node appears mildly increased in size from previous exam. 11 mm on today's study versus 7 mm previously. 3. No mass or adenopathy  identified within the abdomen or pelvis. 4.  Aortic Atherosclerosis (ICD10-I70.0).  [name] Brain Wo Contrast 06/21/2016 IMPRESSION: 1. Small acute/early subacute infarct within the left lateral parietal lobe and subcentimeter focus in left superior parietal lobe. No hemorrhage or significant associated mass effect. 2. Background of moderate chronic microvascular ischemic changes and parenchymal volume loss of the brain. Several chronic lacunar infarcts in basal ganglia and cerebellum.  [name] [name] Head/brain Wo Cm 06/22/2016 IMPRESSION: Negative intracranial MRA. No large or proximal arterial branch occlusion. No high-grade or correctable stenosis.  LE venous doppler - No evidence of deep vein thrombosis or baker's cysts bilaterally.  TTE 04/2016 - Left ventricle: The cavity size was normal. There was mild focal basal hypertrophy of the septum. Systolic function was normal. The estimated ejection fraction was in the range of 60% to 65%. Wall motion was normal; there were no regional wall motion abnormalities. Doppler parameters are consistent with abnormal left ventricular relaxation (grade 1 diastolic dysfunction). - Aortic valve: There was mild regurgitation. Impressions: - No cardiac source of emboli was indentified.  Component     Latest Ref Rng & Units 06/16/2015 06/21/2015 06/26/2015  Cholesterol  0 - 200 mg/dL 185 140   Triglycerides     <150 mg/dL 256 (H) 239 (H)   HDL Cholesterol     >40 mg/dL 43 33 (L)   Total CHOL/HDL Ratio     RATIO 4.3 4.2   VLDL     0 - 40 mg/dL 51 (H) 48 (H)   LDL (calc)     0 - 99 mg/dL 91 59   Hemoglobin A1C     <5.7 % 6.4 (H)  6.3 (H)  Mean Plasma Glucose     mg/dL 137  134   Component     Latest Ref Rng & Units 04/05/2016  Cholesterol     0 - 200 mg/dL 134  Triglycerides     <150 mg/dL 150 (H)  HDL Cholesterol     >40 mg/dL 43  Total CHOL/HDL Ratio     RATIO 3.1  VLDL     0 - 40 mg/dL 30  LDL (calc)     0 - 99 mg/dL 61    Hemoglobin A1C     4.8 - 5.6 % 5.6  Mean Plasma Glucose     mg/dL 114   Component     Latest Ref Rng & Units 06/22/2016  Cholesterol     0 - 200 mg/dL 122  Triglycerides     <150 mg/dL 220 (H)  HDL Cholesterol     >40 mg/dL 42  Total CHOL/HDL Ratio     RATIO 2.9  VLDL     0 - 40 mg/dL 44 (H)  LDL (calc)     0 - 99 mg/dL 36  Hemoglobin A1C     4.8 - 5.6 % 6.0 (H)  Mean Plasma Glucose     mg/dL 126    Assessment: As you may recall, he is a 76 y.o. Caucasian male with PMH of MS diagnosis in 1963 not on meds, CAD, thoracic AA, history of lung nodule benign, urethral carcinoma following with [facility], diastolic CHF, HTN, HLD and GERD admitted on 06/15/2015 for right MCA branch vessel occlusion with infarcts at right insular and posterior frontal lobe. CT head, carotid Doppler, TTE were all negative. LDL 91 and A1c 6.4. His aspirin 81 changed to 325, resumed Lipitor 20, and he was discharged home outpatient PT OT and plan with outpatient TEE and Loop recorder. However, he was readmitted on 06/20/2015 for presyncope with UTI, generalized weakness, dysarthria and gait instability. MRI showed essentially the same infarct as last admission. Symptoms consider as recrudescence of previous stroke in the setting of infection and presyncope. DVT and TEE negative without PFO. Loop recorder placed. Aspirin was changed to Plavix on discharge.   Admitted on 04/04/16 and receivedIV t-PA. MRI showed small right cerebellar and right occipital lobe with left ACA punctate infarct, again embolic pattern. CTA head and neck only showed left VA stenosis. EF 55-60%. TCD bubble study negative for PFO, and loop recorder showed no afib. LDL 61 and A1C 5.6. His plavix changed to aggrenox on discharge. CTA chest showed fusiform dilatation of ascending aorta and mild to moderate thoracic descending aorta atherosclerosis, which can not explain the stroke.Marland Kitchen   He follows with [facility] for urethral cancer and  recurrent UTI. So far no recurrence or metastasis found. Admitted on 06/21/16 for aphasia and left facial droop. MRI showed left MCA acute left lateral parietal and left superior parietal infarcts, embolic pattern. MRA, TTE and DVT negative. Loop recorder again no afib. Pan CT no evidence  of cancer metastasis. LDL 36 and A1C 5.6. After discussion with pt and family, we started eliquis 5mg  bid for stroke prevention. During the interval time, pt has been doing well. Speech improved with home self exercise.  Regarding previous MS diagnosis in 1960s, patient has not been on any MS medication, no MS relapse, no significant symptoms of MS, I am not sure about the diagnosis, MRI white matter changes could be small vessel etiology instead of chronic demyelination. Do not think he needs of initiating MS treatment at this time.  Plan:  - continue eliquis and lipitor for stroke prevention - follow up loop recorder. - walk with walker if needed and avoid fall.  - continue follow up with oncology  - Follow up with your primary care physician for stroke risk factor modification. Recommend maintain blood pressure goal 130-140/80-90, diabetes with hemoglobin A1c goal below 7.0% and lipids with LDL cholesterol goal below 70 mg/dL.  - check BP at home and record, avoid low BP.  - healthy diet and regular exercise - keep the November appointment.  I spent more than 25 minutes of face to face time with the patient. Greater than 50% of time was spent in counseling and coordination of care. We discussed continue eliquis, BP monitoring and management, and follow up with oncology.   No orders of the defined types were placed in this encounter.   No orders of the defined types were placed in this encounter.   Patient Instructions  - continue eliquis and lipitor for stroke prevention - follow up loop recorder. - walk with walker if needed and avoid fall.  - continue follow up with oncology  - Follow up with your  primary care physician for stroke risk factor modification. Recommend maintain blood pressure goal 130-140/80-90, diabetes with hemoglobin A1c goal below 7.0% and lipids with LDL cholesterol goal below 70 mg/dL.  - check BP at home and record, avoid low BP.  - healthy diet and regular exercise - keep the November appointment.   Rosalin Hawking, MD PhD [facility] [address], [place], [place] [address] [phone]

## 2016-08-24 NOTE — Patient Instructions (Addendum)
-   continue eliquis and lipitor for stroke prevention - follow up loop recorder. - walk with walker if needed and avoid fall.  - continue follow up with oncology  - Follow up with your primary care physician for stroke risk factor modification. Recommend maintain blood pressure goal 130-140/80-90, diabetes with hemoglobin A1c goal below 7.0% and lipids with LDL cholesterol goal below 70 mg/dL.  - check BP at home and record, avoid low BP.  - healthy diet and regular exercise - keep the November appointment.

## 2016-08-25 NOTE — Progress Notes (Signed)
Carelink Summary Report / Loop Recorder 

## 2016-08-26 ENCOUNTER — Ambulatory Visit (INDEPENDENT_AMBULATORY_CARE_PROVIDER_SITE_OTHER): Payer: Medicare Other | Admitting: Physician Assistant

## 2016-08-26 ENCOUNTER — Encounter: Payer: Self-pay | Admitting: Physician Assistant

## 2016-08-26 VITALS — BP 130/64 | HR 68 | Ht 72.0 in | Wt 226.4 lb

## 2016-08-26 DIAGNOSIS — I10 Essential (primary) hypertension: Secondary | ICD-10-CM | POA: Diagnosis not present

## 2016-08-26 DIAGNOSIS — Z4509 Encounter for adjustment and management of other cardiac device: Secondary | ICD-10-CM | POA: Diagnosis not present

## 2016-08-26 DIAGNOSIS — I639 Cerebral infarction, unspecified: Secondary | ICD-10-CM

## 2016-08-26 NOTE — Progress Notes (Addendum)
Cardiology Office Note Date:  08/26/2016  Patient ID:  [name] [name], [name] [name] 12/09/1940, MRN [id number] PCP:  [name], MD  Cardiologist:  Dr. Saunders Revel Pulmonary: Dr. Chase Caller Neurology Dr. Erlinda Hong   Chief Complaint: ILR visit  History of Present Illness: [name] [name] is a 76 y.o. male with history of non-obstructive CAD on LHC in 3/08, diastolic HF, HTN, HL, GERD, COPD, MS, recurrent strokes, AKI assoc with NSAID use, bipolar depression, known to have Brugada pattern EKG.   He follows regularly with Dr. Saunders Revel and Kathleen Argue, PA, as well as neurology.  The patient has had unfortunately a number of recurrent embolic strokes, his ILR has not seen AF, though given his strokes, on a/c at neurology recommendation.  He is accompanied by his wife.  They feel like he is doing fairly well.  Has physical limitations with baseline DOE and his MS.  He is very sedentary, they both describe his DOE at his baseline, no escalation of late.  She will give him an extra fluid pill here/there when he is retaining fluid or she hears his breathing different.  He denies any kind of CP, palpitations, no rest SOB, no nighttime SOB.  He has no history ever of syncope, never any dizzy spells or near syncope.  Device information:  MDT ILR, implanted 06/23/15, cryptogenic stroke, Dr. [name]  Past Medical History:  Diagnosis Date  . Arthritis    s/p TKR  . Bladder cancer (HCC)    [facility], [place] low grade papillary urothileal carcinoma  . BPH (benign prostatic hyperplasia)   . Brugada syndrome    Possible Type II Brugada ECG pattern. No family history of SCD, no syncope, no tachypalpitations.  Marland Kitchen CAD (coronary artery disease)    a. LHC 1/15 - mid LCx 50 and 60%, proximal RCA 50%  . Chronic diastolic CHF (congestive heart failure) ([place])   . CKD (chronic kidney disease)   . COPD (chronic obstructive pulmonary disease) ([place])    Prior heavy smoker. PFTs (3/11): FVC 87%, FEV1 73%, ratio 0.57, DLCO 75%, TLC 121%. Moderate  obstructive defect. PFTs (9/15): Only minimal obstruction, sugPrior heavy smoker. PFTs (3/11): FVC 87%, FEV1 73%, ratio 0.57, DLCO 75%, TLC 121%. Moderate obstructive defect. PFTs (9/15) minimal obstruction, poss asthma component   . Depression    with bipolar tendencies  . DOE (dyspnea on exertion)    a. Myoview 8/09- EF 57%, no ischemia // b. Myoview (3/11) EF 68%, diaphragmatic attenuation, no ischemia //  c Echo (4/11) EF 65-78%, mild diastolic dysfunction, PASP 38 mmHg // d. PFTs 9/15 minimal obstruction  /  e. CT negative for ILD  . GERD (gastroesophageal reflux disease)   . History of Doppler ultrasound    Carotid US 3/11 negative for significant stenosis  //  carotid US 6/17: Mild bilateral plaque, 1-39% ICA  . History of echocardiogram    a. Echo 12/14 Inferior and distal septal HK, mild LVH, EF 50-55%, mild MR, mild LAE  //  b. Echo 6/17: Mild focal basal septal hypertrophy, EF 60-65%, normal wall motion, grade 1 diastolic dysfunction, mild AI  . HLD (hyperlipidemia)   . Hypertensive heart disease with CHF (congestive heart failure) ([place]) 04/08/2009  . Low testosterone   . Lung nodule    a. CT in 2015 >> PET in 12/15 not sugg of malignancy   . LVH (left ventricular hypertrophy)    a. Echo 12/14 Inferior and distal septal HK, mild LVH, EF 50-55%, mild MR, mild LAE  .  Mild dementia   . Mitral regurgitation    Echo (4/11) with PISA ERO 0.3 cm^2 and regurgitant volume 41 mL (moderate MR). Echo (12/14) with mild MR.   . MS (multiple sclerosis) ([place])   . MS (multiple sclerosis) ([place])   . Right bundle branch block   . Stroke ([place])   . Thoracic aortic aneurysm (HCC)    4.1 cm 2017  . Urinary retention with incomplete bladder emptying    receives botox injections and treatment for BPH through [facility]    Past Surgical History:  Procedure Laterality Date  . APPENDECTOMY    . EP IMPLANTABLE DEVICE N/A 06/23/2015   Procedure: Loop Recorder Insertion;  Surgeon: Deboraha Sprang, MD;   Location: [place] CV LAB;  Service: Cardiovascular;  Laterality: N/A;  . HEMORRHOID SURGERY    . LEFT HEART CATHETERIZATION WITH CORONARY ANGIOGRAM N/A 01/05/2013   Procedure: LEFT HEART CATHETERIZATION WITH CORONARY ANGIOGRAM;  Surgeon: Larey Dresser, MD;  Location: [facility] CATH LAB;  Service: Cardiovascular;  Laterality: N/A;  . ROTATOR CUFF REPAIR    . TEE WITHOUT CARDIOVERSION N/A 06/23/2015   Procedure: TRANSESOPHAGEAL ECHOCARDIOGRAM (TEE);  Surgeon: Fay Records, MD;  Location: [facility] ENDOSCOPY;  Service: Cardiovascular;  Laterality: N/A;  . TONSILLECTOMY    . TOTAL KNEE ARTHROPLASTY Right   . TRANSURETHRAL RESECTION OF PROSTATE      Current Outpatient Prescriptions  Medication Sig Dispense Refill  . acetaminophen (TYLENOL) 325 MG tablet Take 650 mg by mouth every 6 (six) hours as needed (for pain or headaches).     Marland Kitchen apixaban (ELIQUIS) 5 MG TABS tablet Take 1 tablet (5 mg total) by mouth 2 (two) times daily. 180 tablet 3  . atorvastatin (LIPITOR) 40 MG tablet Take 1 tablet (40 mg total) by mouth daily. 90 tablet 3  . bisoprolol (ZEBETA) 10 MG tablet Take 1 tablet (10 mg total) by mouth daily. 90 tablet 3  . cholecalciferol (VITAMIN D) 400 units TABS tablet Take 400 Units by mouth daily.     . Cyanocobalamin (B-12) 1000 MCG CAPS Take 1,000 mcg by mouth daily.     Marland Kitchen doxazosin (CARDURA) 2 MG tablet Take 1 tablet (2 mg total) by mouth daily. 90 tablet 1  . folic acid (FOLVITE) 1 MG tablet Take 1 mg by mouth daily.    . furosemide (LASIX) 40 MG tablet Take 40 mg by mouth daily.    Marland Kitchen HYDROcodone-acetaminophen (NORCO) 10-325 MG tablet Take 1 tablet by mouth every 6 (six) hours as needed for moderate pain or severe pain. 100 tablet 0  . losartan (COZAAR) 100 MG tablet Take 1 tablet (100 mg total) by mouth daily. 90 tablet 3  . MAGNESIUM CITRATE PO Take 0.5-1 Bottles by mouth once as needed (for mild constipation).     . Multiple Vitamins-Minerals (DRY EYE FORMULA) CAPS Place 1-2 drops into both  eyes 4 (four) times daily as needed (for dry eyes).    . pantoprazole (PROTONIX) 40 MG tablet Take 1 tablet (40 mg total) by mouth daily. 90 tablet 3  . PROAIR HFA 108 (90 Base) MCG/ACT inhaler USE 1 TO 2 INHALATIONS EVERY 6 HOURS AS NEEDED FOR WHEEZING OR SHORTNESS OF BREATH 25.5 g 4  . rOPINIRole (REQUIP) 1 MG tablet TAKE 1 TABLET AT BEDTIME 90 tablet 3  . Tiotropium Bromide Monohydrate (SPIRIVA RESPIMAT) 2.5 MCG/ACT AERS Inhale 2 puffs into the lungs daily. 3 Inhaler 3  . venlafaxine XR (EFFEXOR-XR) 150 MG 24 hr capsule TAKE 1 CAPSULE  TWICE A DAY 180 capsule 3   No current facility-administered medications for this visit.     Allergies:   Acetylcholine; Alcohol-sulfur [sulfur]; Amitriptyline; Bupivacaine; Clomipramine hcl; Cocaine; Desipramine; Ergonovine; Flecainide; Lithium; Loxapine; Nortriptyline; Oxcarbazepine; Procainamide; Procaine; Propafenone; Propofol; and Trifluoperazine   Social History:  The patient  reports that he quit smoking about 23 years ago. His smoking use included Cigarettes. He has a 80.00 pack-year smoking history. He has quit using smokeless tobacco. His smokeless tobacco use included Chew. He reports that he drinks about 15.0 oz of alcohol per week . He reports that he does not use drugs.   Family History:  The patient's family history includes Arthritis in his brother; Hypertension in his sister and unknown relative; Kidney cancer in his brother; Other in his brother; Stroke in his father and mother.  ROS:  Please see the history of present illness.  All other systems are reviewed and otherwise negative.   PHYSICAL EXAM:  VS:  BP 130/64   Pulse 68   Ht 6' (1.829 m)   Wt 226 lb 6.4 oz (102.7 kg)   BMI 30.71 kg/m  BMI: Body mass index is 30.71 kg/m. Well nourished, well developed, in no acute distress  HEENT: normocephalic, atraumatic  Neck: no JVD, carotid bruits or masses Cardiac:  RRR; no significant murmurs, no rubs, or gallops Lungs:  CTA b/l, no  wheezing, rhonchi or rales  Abd: soft, nontender MS: no deformity or atrophy Ext: trace edema  Skin: warm and dry, no rash Neuro:  No gross deficits appreciated Psych: euthymic mood, full affect  ILR site is stable, no tethering or discomfort   EKG:  Not done today ILR interrogation done today and reviewed by myself: battery is good, no device observations, no AF to date, R wave 0.57mV   Chest CTA 04/28/16 IMPRESSION: 1. Stable fusiform dilatation of the ascending aorta measuring 4.0 cm in maximum diameter. Recommend annual imaging followup by CTA or MRA. This recommendation follows 2010 ACCF/AHA/AATS/ACR/ASA/SCA/SCAI/SIR/STS/SVM Guidelines for the Diagnosis and Management of Patients with Thoracic Aortic Disease. Circulation. 2010; 121: A540-J811. 2. Moderate thoracic aortic atherosclerosis. 3. Coronary artery calcifications. 4. Stable pulmonary nodules on the right consistent with prior inflammation and probable subsequent scarring. No new or enlarging pulmonary nodule is seen.  Echo 04/06/16 EF 55-60, normal wall motion, normal diastolic function, mild AI, mild MR  LHC 1/15 LM No significant disease.  LAD: Luminal irregularities.  LCx: serial 50% and 60% stenoses in the mid LCx.  RCA: 50% proximal RCA stenosis.  LVEF is estimated at 55% without significant wall motion abnormalities noted in RAO projection.  Final Conclusions: I suspect that at this point COPD is the major cause of his dyspnea.  Recent Labs: 04/07/2016: NT-Pro BNP 117 06/21/2016: ALT 16 06/23/2016: BUN 14; Creatinine, Ser 1.08; Hemoglobin 13.5; Platelets 262; Potassium 4.0; Sodium 134  06/22/2016: Cholesterol 122; HDL 42; LDL Cholesterol 36; Total CHOL/HDL Ratio 2.9; Triglycerides 220; VLDL 44   CrCl cannot be calculated (Patient's most recent lab result is older than the maximum 21 days allowed.).   Wt Readings from Last 3 Encounters:  08/26/16 226 lb 6.4 oz (102.7 kg)  08/24/16 221 lb 12.8 oz  (100.6 kg)  08/18/16 220 lb 12.8 oz (100.2 kg)     Other studies reviewed: Additional studies/records reviewed today include: summarized above  ASSESSMENT AND PLAN:  1. ILR     Site is stable, no AF to date  2. Recurrent CVA's     Now on a/c  with recurrent embolic strokes, follows with Dr. Erlinda Hong  3. HTN     Looks OK  4. Brugada pattern EKG     No hx of syncope   We discussed healthy lifestyle, eating, weight loss, and getting started with walking for exercise to his capacity.       Disposition: F/u with Dr. Erin Sons, PA as directed by them, as well as his other specialists.  He can continue ILR remotes monthly, no need for EP follow up specifically unless an issue arises.  As of yet, no AF/observations.    Current medicines are reviewed at length with the patient today.  The patient did not have any concerns regarding medicines.  Haywood Lasso, PA-C 08/26/2016 2:13 PM     [facility] [address] [place] [address] [phone] (office)  [phone] (fax)

## 2016-08-26 NOTE — Patient Instructions (Signed)
Medication Instructions:  Your physician recommends that you continue on your current medications as directed. Please refer to the Current Medication list given to you today.   Labwork: None Ordered   Testing/Procedures: None Ordered    Follow-Up: Keep everything as scheduled  Any Other Special Instructions Will Be Listed Below (If Applicable).     If you need a refill on your cardiac medications before your next appointment, please call your pharmacy.

## 2016-08-27 ENCOUNTER — Ambulatory Visit: Payer: Medicare Other | Admitting: *Deleted

## 2016-08-27 ENCOUNTER — Encounter: Payer: Self-pay | Admitting: *Deleted

## 2016-08-27 DIAGNOSIS — R41841 Cognitive communication deficit: Secondary | ICD-10-CM

## 2016-08-27 LAB — CUP PACEART REMOTE DEVICE CHECK
Implantable Pulse Generator Implant Date: 20170619
MDC IDC SESS DTM: 20180819121001

## 2016-08-27 NOTE — Therapy (Signed)
[facility] [address] [place], [place], [address] Phone: [phone]   Fax:  [phone]  Speech Language Pathology Treatment  Patient Details  Name: [name] [name] MRN: [id number] Date of Birth: [birth year] Referring Provider: Dr. Rosalin Hawking  Encounter Date: 08/27/2016      End of Session - 08/27/16 1325    Visit Number 11   Number of Visits 17   Date for SLP Re-Evaluation 09/03/16   SLP Start Time 23   SLP Stop Time  1200   SLP Time Calculation (min) 60 min   Activity Tolerance Patient tolerated treatment well      Past Medical History:  Diagnosis Date  . Arthritis    s/p TKR  . Bladder cancer (HCC)    [facility], [place] low grade papillary urothileal carcinoma  . BPH (benign prostatic hyperplasia)   . Brugada syndrome    Possible Type II Brugada ECG pattern. No family history of SCD, no syncope, no tachypalpitations.  Marland Kitchen CAD (coronary artery disease)    a. LHC 1/15 - mid LCx 50 and 60%, proximal RCA 50%  . Chronic diastolic CHF (congestive heart failure) ([place])   . CKD (chronic kidney disease)   . COPD (chronic obstructive pulmonary disease) ([place])    Prior heavy smoker. PFTs (3/11): FVC 87%, FEV1 73%, ratio 0.57, DLCO 75%, TLC 121%. Moderate obstructive defect. PFTs (9/15): Only minimal obstruction, sugPrior heavy smoker. PFTs (3/11): FVC 87%, FEV1 73%, ratio 0.57, DLCO 75%, TLC 121%. Moderate obstructive defect. PFTs (9/15) minimal obstruction, poss asthma component   . Depression    with bipolar tendencies  . DOE (dyspnea on exertion)    a. Myoview 8/09- EF 57%, no ischemia // b. Myoview (3/11) EF 68%, diaphragmatic attenuation, no ischemia //  c Echo (4/11) EF 22-44%, mild diastolic dysfunction, PASP 38 mmHg // d. PFTs 9/15 minimal obstruction  /  e. CT negative for ILD  . GERD (gastroesophageal reflux disease)   . History of Doppler ultrasound    Carotid US 3/11 negative for significant stenosis  //  carotid  US 6/17: Mild bilateral plaque, 1-39% ICA  . History of echocardiogram    a. Echo 12/14 Inferior and distal septal HK, mild LVH, EF 50-55%, mild MR, mild LAE  //  b. Echo 6/17: Mild focal basal septal hypertrophy, EF 60-65%, normal wall motion, grade 1 diastolic dysfunction, mild AI  . HLD (hyperlipidemia)   . Hypertensive heart disease with CHF (congestive heart failure) ([place]) 04/08/2009  . Low testosterone   . Lung nodule    a. CT in 2015 >> PET in 12/15 not sugg of malignancy   . LVH (left ventricular hypertrophy)    a. Echo 12/14 Inferior and distal septal HK, mild LVH, EF 50-55%, mild MR, mild LAE  . Mild dementia   . Mitral regurgitation    Echo (4/11) with PISA ERO 0.3 cm^2 and regurgitant volume 41 mL (moderate MR). Echo (12/14) with mild MR.   . MS (multiple sclerosis) ([place])   . MS (multiple sclerosis) ([place])   . Right bundle branch block   . Stroke ([place])   . Thoracic aortic aneurysm (HCC)    4.1 cm 2017  . Urinary retention with incomplete bladder emptying    receives botox injections and treatment for BPH through [facility]    Past Surgical History:  Procedure Laterality Date  . APPENDECTOMY    . EP IMPLANTABLE DEVICE N/A 06/23/2015   Procedure: Loop Recorder Insertion;  Surgeon: Remo Lipps  Peterson Lombard, MD;  Location: [place] CV LAB;  Service: Cardiovascular;  Laterality: N/A;  . HEMORRHOID SURGERY    . LEFT HEART CATHETERIZATION WITH CORONARY ANGIOGRAM N/A 01/05/2013   Procedure: LEFT HEART CATHETERIZATION WITH CORONARY ANGIOGRAM;  Surgeon: Larey Dresser, MD;  Location: [facility] CATH LAB;  Service: Cardiovascular;  Laterality: N/A;  . ROTATOR CUFF REPAIR    . TEE WITHOUT CARDIOVERSION N/A 06/23/2015   Procedure: TRANSESOPHAGEAL ECHOCARDIOGRAM (TEE);  Surgeon: Fay Records, MD;  Location: [facility] ENDOSCOPY;  Service: Cardiovascular;  Laterality: N/A;  . TONSILLECTOMY    . TOTAL KNEE ARTHROPLASTY Right   . TRANSURETHRAL RESECTION OF PROSTATE      There were no vitals filed for this  visit.      Subjective Assessment - 08/27/16 1108    Subjective "get ready for something" (carolyn)   Patient is accompained by: Family member  wife   Currently in Pain? No/denies               ADULT SLP TREATMENT - 08/27/16 0001      General Information   Behavior/Cognition Alert;Cooperative;Pleasant mood;Distractible;Decreased sustained attention     Treatment Provided   Treatment provided Cognitive-Linquistic     Pain Assessment   Pain Assessment No/denies pain     Cognitive-Linquistic Treatment   Treatment focused on Cognition;Patient/family/caregiver education   Skilled Treatment Difficult session today, as pt was more agitated and unwilling to engage in conversation about cognitive deficits. The Montreal Cognitive Assessment (MoCA) was re-administered, with improvement of only 2 points since July 6. 2018 (16/30). Today's administration revealed a score of 18/30, consistent with significant cognitive deficits, specifically higher level thinking skills including thought organization, recall, reasoning, and higher levels of attention. Pt exhibits a significant lack of awareness of deficits, and their functional impact, making unsafe decisions at home. Continued therapy will not be beneficial for pt if his awareness or willingness to participate in therapy improves.      Assessment / Recommendations / Plan   Plan Continue with current plan of care     Progression Toward Goals   Progression toward goals Not progressing toward goals (comment)          SLP Education - 08/27/16 1323    Education provided Yes   Education Details lack of awareness of deficits and functional impact, poor judgment and unsafe decision making.   Person(s) Educated Patient;Spouse   Methods Explanation;Demonstration   Comprehension Verbalized understanding;Need further instruction          SLP Short Term Goals - 08/27/16 1356      SLP SHORT TERM GOAL #1   Title Pt will implement  functional system to facilitate recall, using it at least 2x/week between therapies.   Status Not Met     SLP SHORT TERM GOAL #2   Title Pt will verbalize 3 cognitive deficits given min cues   Status Achieved     SLP SHORT TERM GOAL #3   Title Pt will complete simple organization, reasoning, problem solving tasks with 80% accuracy given mod assist   Status Not Met     SLP SHORT TERM GOAL #4   Title pt will complete cognitive tasks at home to improve cognitive function, 80% of days between sessions   Status Partially Met          SLP Long Term Goals - 08/27/16 1357      SLP LONG TERM GOAL #1   Title Pt/wife will report improved functional recall of appointments, medications,  events, activities using identified strategy, recalling 2+ events between therapy sessions with min reminders from wife.   Time 5   Period Weeks   Status On-going     SLP LONG TERM GOAL #2   Title Pt will verbalize at least 2 compensatory strategies for memory skills with modified independence   Status Revised     SLP LONG TERM GOAL #3   Title Pt will complete mod complex thought organization, reasoning, problem solving tasks with 80% accuracy given min assist.   Time 4   Period Weeks   Status On-going     SLP LONG TERM GOAL #4   Title Pt will accurately load med organizer with min-mod cues occasionally   Time 4   Period Weeks   Status On-going     SLP LONG TERM GOAL #5   Title Pt/wife will report pt to require min A usually with medication administration at home   Time 4   Period Weeks   Status On-going     SLP LONG TERM GOAL #6   Title pt will complete therapy tasks during non-therapy days 80% of the time   Time 4   Period Weeks   Status On-going          Plan - 08/27/16 1349    Clinical Impression Statement Pt presents today with increase noted in agitation, use of profanity, and interruption of SLP and wife. Pt exhibits poor awareness of deficits and functional impact, and continues  to make unsafe decisions at home (mowing the grass, driving). Pt was resistant to redirection and appears to be causing his wife significant stress at home. Difficult discussion with pt/wife regarding effectiveness of therapy if he is adamantly opposed to listen to reason and realize his cognitive deficits. Pt/wife were encouraged to discuss continuing with treatment or not over the weekend, as currently pt is not benefitting from skilled therapy given lack of awareness or willingness to participate in treatment and/or homework. Pt would benefit from continued skilled ST intervention with willingness to participate, however, he may need a break from therapy for awhile, until he is more willing to cooperate and attend to direction and encouragement of SLP and family members. At this time, pt makes unsafe, unwise decisions, and is at high risk for injury to self or others.   Speech Therapy Frequency 2x / week   Duration --  8 weeks   Treatment/Interventions Environmental controls;Cueing hierarchy;SLP instruction and feedback;Compensatory strategies;Functional tasks;Cognitive reorganization;Patient/family education;Multimodal communcation approach;Internal/external aids   Potential to Achieve Goals Fair   Potential Considerations Ability to learn/carryover information;Family/community support;Previous level of function;Cooperation/participation level;Severity of impairments   SLP Home Exercise Plan homework assignments given   Consulted and Agree with Plan of Care Patient;Family member/caregiver   Family Member Consulted wife carolyn      Patient will benefit from skilled therapeutic intervention in order to improve the following deficits and impairments:   Cognitive communication deficit    Problem List Patient Active Problem List   Diagnosis Date Noted  . 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 Acute ischemic stroke ([place]) 06/22/2016  . Stroke (cerebrum) ([place]) 06/21/2016  . Expressive aphasia   . Essential  hypertension 05/17/2016  . Family history of stroke 04/06/2016  . Cognitive deficits 04/06/2016  . Cerebrovascular accident (CVA) due to embolism of cerebral artery ([place]) 04/04/2016  . Syncope and collapse   . Dysarthria   . Acute cystitis without hematuria   . History of ischemic right MCA stroke   . Slurred  speech 06/20/2015  . AKI (acute kidney injury) ([place]) 06/20/2015  . Thoracic aortic aneurysm ([place])   . Hyperlipidemia 06/05/2015  . Gastroesophageal reflux disease without esophagitis 06/05/2015  . COPD  GOLD 0  09/04/2013  . Lung nodule 09/04/2013  . Cancer ([place])   . Coronary artery disease involving native coronary artery of native heart without angina pectoris 02/01/2013  . Chronic diastolic CHF (congestive heart failure) ([place]) 01/03/2013  . Depression   . Urinary retention with incomplete bladder emptying   . BPH (benign prostatic hyperplasia)   . Brugada syndrome 08/23/2011  . Hypertensive heart disease with CHF (congestive heart failure) ([place]) 04/08/2009  . Mitral valve disorder 04/08/2009  . CAROTID BRUIT, RIGHT 03/06/2009  . SHORTNESS OF BREATH 03/06/2009  . OTHER CHEST PAIN 03/06/2009  . Possible Multiple sclerosis ([place]) 10/11/2006   Jeyda Siebel B. Quentin Ore, [facility], CCC-SLP Speech Pathologist  Shonna Chock 08/27/2016, 1:58 PM  [facility] [address] [place], [place], [address] Phone: [phone]   Fax:  [phone]   Name: [name] [name] MRN: [id number] Date of Birth: [birth year]

## 2016-08-31 ENCOUNTER — Encounter: Payer: Medicare Other | Admitting: *Deleted

## 2016-09-01 ENCOUNTER — Telehealth: Payer: Self-pay | Admitting: Family Medicine

## 2016-09-01 NOTE — Telephone Encounter (Signed)
Pt calling for hydrocodone.

## 2016-09-01 NOTE — Telephone Encounter (Signed)
Ok to refill 

## 2016-09-02 MED ORDER — HYDROCODONE-ACETAMINOPHEN 10-325 MG PO TABS: 1.0000 | ORAL_TABLET | Freq: Four times a day (QID) | ORAL | 0 refills | Status: DC | PRN

## 2016-09-02 NOTE — Telephone Encounter (Signed)
ok 

## 2016-09-02 NOTE — Telephone Encounter (Signed)
RX printed, left up front and patient aware to pick up  

## 2016-09-03 ENCOUNTER — Other Ambulatory Visit (INDEPENDENT_AMBULATORY_CARE_PROVIDER_SITE_OTHER): Payer: Medicare Other

## 2016-09-03 ENCOUNTER — Encounter: Payer: Medicare Other | Admitting: *Deleted

## 2016-09-03 DIAGNOSIS — Z7901 Long term (current) use of anticoagulants: Secondary | ICD-10-CM | POA: Diagnosis not present

## 2016-09-03 DIAGNOSIS — I10 Essential (primary) hypertension: Secondary | ICD-10-CM

## 2016-09-03 DIAGNOSIS — I5032 Chronic diastolic (congestive) heart failure: Secondary | ICD-10-CM | POA: Diagnosis not present

## 2016-09-04 LAB — BASIC METABOLIC PANEL
BUN / CREAT RATIO: 17 (ref 10–24)
BUN: 18 mg/dL (ref 8–27)
CALCIUM: 9.5 mg/dL (ref 8.6–10.2)
CO2: 22 mmol/L (ref 20–29)
CREATININE: 1.03 mg/dL (ref 0.76–1.27)
Chloride: 98 mmol/L (ref 96–106)
GFR calc Af Amer: 82 mL/min/{1.73_m2} (ref >59)
GFR, EST NON AFRICAN AMERICAN: 71 mL/min/{1.73_m2} (ref >59)
Glucose: 106 mg/dL — ABNORMAL HIGH (ref 65–99)
Potassium: 4.2 mmol/L (ref 3.5–5.2)
SODIUM: 136 mmol/L (ref 134–144)

## 2016-09-04 LAB — CBC
Hematocrit: 40.5 % (ref 37.5–51.0)
Hemoglobin: 13.4 g/dL (ref 13.0–17.7)
MCH: 31.9 pg (ref 26.6–33.0)
MCHC: 33.1 g/dL (ref 31.5–35.7)
MCV: 96 fL (ref 79–97)
PLATELETS: 295 10*3/uL (ref 150–379)
RBC: 4.2 x10E6/uL (ref 4.14–5.80)
RDW: 14.1 % (ref 12.3–15.4)
WBC: 8 10*3/uL (ref 3.4–10.8)

## 2016-09-07 ENCOUNTER — Telehealth: Payer: Self-pay | Admitting: *Deleted

## 2016-09-07 NOTE — Telephone Encounter (Signed)
-----   Message from Liliane Shi, [place] sent at 09/06/2016  8:06 AM EDT ----- Please call the patient Kidney function (creatinine) and hemoglobin are normal. Continue with current treatment plan. Richardson Dopp, PA-C   09/06/2016 8:05 AM

## 2016-09-07 NOTE — Telephone Encounter (Signed)
Lmtcb to go over lab results. Results have been released to [place] as well.

## 2016-09-08 ENCOUNTER — Telehealth: Payer: Self-pay | Admitting: Family Medicine

## 2016-09-08 ENCOUNTER — Other Ambulatory Visit: Payer: Self-pay | Admitting: Family Medicine

## 2016-09-08 MED ORDER — FUROSEMIDE 40 MG PO TABS: 40.0000 mg | ORAL_TABLET | Freq: Every day | ORAL | 0 refills | Status: DC

## 2016-09-08 NOTE — Telephone Encounter (Signed)
New Message   *STAT* If patient is at the pharmacy, call can be transferred to refill team.   1. Which medications need to be refilled? (please list name of each medication and dose if known)  furosemide (Lasix) 40 mg tablet once daily  2. Which pharmacy/location (including street and city if local pharmacy) is medication to be sent to? [facility] [place], [place], [place] (phone)  3. Do they need a 30 day or 90 day supply?  30 day supply

## 2016-09-08 NOTE — Telephone Encounter (Signed)
Prescription sent to pharmacy.

## 2016-09-08 NOTE — Telephone Encounter (Signed)
Lmom lab work ok, no changes to be made. If any questions please call the office [phone]. Results have been sent to [facility].

## 2016-09-08 NOTE — Telephone Encounter (Signed)
-----   Message from Liliane Shi, [place] sent at 09/06/2016  8:06 AM EDT ----- Please call the patient Kidney function (creatinine) and hemoglobin are normal. Continue with current treatment plan. Richardson Dopp, PA-C   09/06/2016 8:05 AM

## 2016-09-21 ENCOUNTER — Ambulatory Visit (INDEPENDENT_AMBULATORY_CARE_PROVIDER_SITE_OTHER): Payer: Medicare Other | Admitting: *Deleted

## 2016-09-21 ENCOUNTER — Encounter: Payer: Medicare Other | Admitting: *Deleted

## 2016-09-21 ENCOUNTER — Ambulatory Visit (INDEPENDENT_AMBULATORY_CARE_PROVIDER_SITE_OTHER): Payer: Medicare Other

## 2016-09-21 DIAGNOSIS — Z23 Encounter for immunization: Secondary | ICD-10-CM | POA: Diagnosis not present

## 2016-09-21 DIAGNOSIS — I639 Cerebral infarction, unspecified: Secondary | ICD-10-CM

## 2016-09-21 NOTE — Progress Notes (Signed)
Carelink Summary Report / Loop Recorder 

## 2016-09-23 LAB — CUP PACEART REMOTE DEVICE CHECK
Date Time Interrogation Session: 20180918120848
MDC IDC PG IMPLANT DT: 20170619

## 2016-09-24 ENCOUNTER — Encounter: Payer: Medicare Other | Admitting: *Deleted

## 2016-09-28 ENCOUNTER — Encounter: Payer: Medicare Other | Admitting: *Deleted

## 2016-10-06 ENCOUNTER — Telehealth: Payer: Self-pay | Admitting: Cardiology

## 2016-10-06 NOTE — Telephone Encounter (Signed)
LMOVM requesting that pt send manual transmission b/c home monitor has not updated in at least 14 days.    

## 2016-10-08 NOTE — Therapy (Signed)
[facility] [address] [place], [place], [address] Phone: [phone]   Fax:  [phone]  Speech Language Pathology Treatment  Patient Details  Name: [name] [name] MRN: [id number] Date of Birth: 1940-05-12 Referring Provider: Dr. Rosalin Hawking  Encounter Date: 08/27/2016    Past Medical History:  Diagnosis Date  . Arthritis    s/p TKR  . Bladder cancer (HCC)    [facility], [facility] low grade papillary urothileal carcinoma  . BPH (benign prostatic hyperplasia)   . Brugada syndrome    Possible Type II Brugada ECG pattern. No family history of SCD, no syncope, no tachypalpitations.  Marland Kitchen CAD (coronary artery disease)    a. LHC 1/15 - mid LCx 50 and 60%, proximal RCA 50%  . Chronic diastolic CHF (congestive heart failure) ([place])   . CKD (chronic kidney disease)   . COPD (chronic obstructive pulmonary disease) ([place])    Prior heavy smoker. PFTs (3/11): FVC 87%, FEV1 73%, ratio 0.57, DLCO 75%, TLC 121%. Moderate obstructive defect. PFTs (9/15): Only minimal obstruction, sugPrior heavy smoker. PFTs (3/11): FVC 87%, FEV1 73%, ratio 0.57, DLCO 75%, TLC 121%. Moderate obstructive defect. PFTs (9/15) minimal obstruction, poss asthma component   . Depression    with bipolar tendencies  . DOE (dyspnea on exertion)    a. Myoview 8/09- EF 57%, no ischemia // b. Myoview (3/11) EF 68%, diaphragmatic attenuation, no ischemia //  c Echo (4/11) EF 75-88%, mild diastolic dysfunction, PASP 38 mmHg // d. PFTs 9/15 minimal obstruction  /  e. CT negative for ILD  . GERD (gastroesophageal reflux disease)   . History of Doppler ultrasound    Carotid US 3/11 negative for significant stenosis  //  carotid US 6/17: Mild bilateral plaque, 1-39% ICA  . History of echocardiogram    a. Echo 12/14 Inferior and distal septal HK, mild LVH, EF 50-55%, mild MR, mild LAE  //  b. Echo 6/17: Mild focal basal septal hypertrophy, EF 60-65%, normal wall motion, grade 1  diastolic dysfunction, mild AI  . HLD (hyperlipidemia)   . Hypertensive heart disease with CHF (congestive heart failure) ([place]) 04/08/2009  . Low testosterone   . Lung nodule    a. CT in 2015 >> PET in 12/15 not sugg of malignancy   . LVH (left ventricular hypertrophy)    a. Echo 12/14 Inferior and distal septal HK, mild LVH, EF 50-55%, mild MR, mild LAE  . Mild dementia   . Mitral regurgitation    Echo (4/11) with PISA ERO 0.3 cm^2 and regurgitant volume 41 mL (moderate MR). Echo (12/14) with mild MR.   . MS (multiple sclerosis) ([place])   . MS (multiple sclerosis) ([place])   . Right bundle branch block   . Stroke ([place])   . Thoracic aortic aneurysm (HCC)    4.1 cm 2017  . Urinary retention with incomplete bladder emptying    receives botox injections and treatment for BPH through [facility]    Past Surgical History:  Procedure Laterality Date  . APPENDECTOMY    . EP IMPLANTABLE DEVICE N/A 06/23/2015   Procedure: Loop Recorder Insertion;  Surgeon: Deboraha Sprang, MD;  Location: [facility];  Service: Cardiovascular;  Laterality: N/A;  . HEMORRHOID SURGERY    . LEFT HEART CATHETERIZATION WITH CORONARY ANGIOGRAM N/A 01/05/2013   Procedure: LEFT HEART CATHETERIZATION WITH CORONARY ANGIOGRAM;  Surgeon: Larey Dresser, MD;  Location: [facility] CATH LAB;  Service: Cardiovascular;  Laterality: N/A;  . ROTATOR CUFF  REPAIR    . TEE WITHOUT CARDIOVERSION N/A 06/23/2015   Procedure: TRANSESOPHAGEAL ECHOCARDIOGRAM (TEE);  Surgeon: Fay Records, MD;  Location: [facility] ENDOSCOPY;  Service: Cardiovascular;  Laterality: N/A;  . TONSILLECTOMY    . TOTAL KNEE ARTHROPLASTY Right   . TRANSURETHRAL RESECTION OF PROSTATE      There were no vitals filed for this visit.         SPEECH THERAPY DISCHARGE SUMMARY  Visits from Start of Care: 11  Current functional level related to goals / functional outcomes: Pt exhibits poor awareness of deficits and functional impact, and continues to make unsafe decisions at  home (mowing the grass, driving). Pt was resistant to redirection and appears to be causing his wife significant stress at home. Difficult discussion with pt/wife regarding effectiveness of therapy if he is adamantly opposed to listen to reason and realize his cognitive deficits   Remaining deficits: Pt continues to exhibit deficits in higher level cognition, including functional recall, attention, awareness of deficits and functional impact, reasoning, and judgment. Pt becomes agitated easily, and is resistant to redirection or education.    Education / Equipment: Education was provided on an ongoing basis throughout therapy. Plan: Patient agrees to discharge.  Patient goals were not met. Patient is being discharged due to lack of progress.  ?????              SLP Short Term Goals - 08/27/16 1356      SLP SHORT TERM GOAL #1   Title Pt will implement functional system to facilitate recall, using it at least 2x/week between therapies.   Status Not Met     SLP SHORT TERM GOAL #2   Title Pt will verbalize 3 cognitive deficits given min cues   Status Not Met     SLP SHORT TERM GOAL #3   Title Pt will complete simple organization, reasoning, problem solving tasks with 80% accuracy given mod assist   Status Not Met     SLP SHORT TERM GOAL #4   Title pt will complete cognitive tasks at home to improve cognitive function, 80% of days between sessions   Status Not Met          SLP Long Term Goals - 08/27/16 1357      SLP LONG TERM GOAL #1   Title Pt/wife will report improved functional recall of appointments, medications, events, activities using identified strategy, recalling 2+ events between therapy sessions with min reminders from wife.   Time 5   Period Weeks   Status On-going     SLP LONG TERM GOAL #2   Title Pt will verbalize at least 2 compensatory strategies for memory skills with modified independence   Status Revised     SLP LONG TERM GOAL #3   Title Pt will  complete mod complex thought organization, reasoning, problem solving tasks with 80% accuracy given min assist.   Time 4   Period Weeks   Status Not met     SLP LONG TERM GOAL #4   Title Pt will accurately load med organizer with min-mod cues occasionally   Time 4   Period Weeks   Status Not Met     SLP LONG TERM GOAL #5   Title Pt/wife will report pt to require min A usually with medication administration at home   Time 4   Period Weeks   Status Not Met     SLP LONG TERM GOAL #6   Title pt will complete therapy tasks  during non-therapy days 80% of the time   Time 4   Period Weeks   Status Not met        Patient will benefit from skilled therapeutic intervention in order to improve the following deficits and impairments:   Cognitive communication deficit    Problem List Patient Active Problem List   Diagnosis Date Noted  . 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 Acute ischemic stroke ([place]) 06/22/2016  . Stroke (cerebrum) ([place]) 06/21/2016  . Expressive aphasia   . Essential hypertension 05/17/2016  . Family history of stroke 04/06/2016  . Cognitive deficits 04/06/2016  . Cerebrovascular accident (CVA) due to embolism of cerebral artery ([place]) 04/04/2016  . Syncope and collapse   . Dysarthria   . Acute cystitis without hematuria   . History of ischemic right MCA stroke   . Slurred speech 06/20/2015  . AKI (acute kidney injury) ([place]) 06/20/2015  . Thoracic aortic aneurysm ([place])   . Hyperlipidemia 06/05/2015  . Gastroesophageal reflux disease without esophagitis 06/05/2015  . COPD  GOLD 0  09/04/2013  . Lung nodule 09/04/2013  . Cancer ([place])   . Coronary artery disease involving native coronary artery of native heart without angina pectoris 02/01/2013  . Chronic diastolic CHF (congestive heart failure) ([place]) 01/03/2013  . Depression   . Urinary retention with incomplete bladder emptying   . BPH (benign prostatic hyperplasia)   . Brugada syndrome 08/23/2011  . Hypertensive  heart disease with CHF (congestive heart failure) ([place]) 04/08/2009  . Mitral valve disorder 04/08/2009  . CAROTID BRUIT, RIGHT 03/06/2009  . SHORTNESS OF BREATH 03/06/2009  . OTHER CHEST PAIN 03/06/2009  . Possible Multiple sclerosis ([place]) 10/11/2006   Jassen Sarver B. [facility], CCC-SLP Speech Language Pathologist  Shonna Chock 10/08/2016, 1:28 PM  [facility] [address] [place], [place], [address] Phone: [phone]   Fax:  [phone]   Name: LEKEITH WULF MRN: [id number] Date of Birth: 1940/03/14

## 2016-10-12 ENCOUNTER — Encounter: Payer: Self-pay | Admitting: Family Medicine

## 2016-10-12 ENCOUNTER — Ambulatory Visit (INDEPENDENT_AMBULATORY_CARE_PROVIDER_SITE_OTHER): Payer: Medicare Other | Admitting: Family Medicine

## 2016-10-12 VITALS — BP 158/80 | HR 86 | Temp 97.8°F | Resp 18 | Ht 72.0 in | Wt 231.0 lb

## 2016-10-12 DIAGNOSIS — R413 Other amnesia: Secondary | ICD-10-CM

## 2016-10-12 DIAGNOSIS — I639 Cerebral infarction, unspecified: Secondary | ICD-10-CM

## 2016-10-12 MED ORDER — HYDROCODONE-ACETAMINOPHEN 10-325 MG PO TABS: 1.0000 | ORAL_TABLET | Freq: Four times a day (QID) | ORAL | 0 refills | Status: DC | PRN

## 2016-10-12 NOTE — Progress Notes (Signed)
Subjective:    Patient ID: [name] [name], male    DOB: Nov 15, 1940, 76 y.o.   MRN: [id number]  HPI Wife brings him patient for evaluation to determine should he still be driving. Past medical history is significant for progressive multiple sclerosis, multiple ischemic strokes, chronic osteoarthritic pain on 3 time a day hydrocodone WHICH contribute to this discussion. Physically, the patient has muscle strength 5 over 5 equal and symmetric in the arms and in the legs with normal reflexes. His physical reaction time is normal to catching a dropped object. Cerebellar exam is normal today however on Mini-Mental status exam, the patient tells me that the year is 1983. He is unable to correctly identify the month. He can #2 out of 3 objects on recall. He scores 3 out of 5 on world spelling in reverse. He is unable to perform serial sevens. It takes him 25 seconds to draw a clock although he does draw correctly. However he is unable to recognize patterns or to correctly copy intersecting pentagons.   Past Medical History:  Diagnosis Date  . Arthritis    s/p TKR  . Bladder cancer (HCC)    [facility], [place] low grade papillary urothileal carcinoma  . BPH (benign prostatic hyperplasia)   . Brugada syndrome    Possible Type II Brugada ECG pattern. No family history of SCD, no syncope, no tachypalpitations.  Marland Kitchen CAD (coronary artery disease)    a. LHC 1/15 - mid LCx 50 and 60%, proximal RCA 50%  . Chronic diastolic CHF (congestive heart failure) ([place])   . CKD (chronic kidney disease)   . COPD (chronic obstructive pulmonary disease) ([place])    Prior heavy smoker. PFTs (3/11): FVC 87%, FEV1 73%, ratio 0.57, DLCO 75%, TLC 121%. Moderate obstructive defect. PFTs (9/15): Only minimal obstruction, sugPrior heavy smoker. PFTs (3/11): FVC 87%, FEV1 73%, ratio 0.57, DLCO 75%, TLC 121%. Moderate obstructive defect. PFTs (9/15) minimal obstruction, poss asthma component   . Depression    with bipolar tendencies  . DOE  (dyspnea on exertion)    a. Myoview 8/09- EF 57%, no ischemia // b. Myoview (3/11) EF 68%, diaphragmatic attenuation, no ischemia //  c Echo (4/11) EF 02-40%, mild diastolic dysfunction, PASP 38 mmHg // d. PFTs 9/15 minimal obstruction  /  e. CT negative for ILD  . GERD (gastroesophageal reflux disease)   . History of Doppler ultrasound    Carotid US 3/11 negative for significant stenosis  //  carotid US 6/17: Mild bilateral plaque, 1-39% ICA  . History of echocardiogram    a. Echo 12/14 Inferior and distal septal HK, mild LVH, EF 50-55%, mild MR, mild LAE  //  b. Echo 6/17: Mild focal basal septal hypertrophy, EF 60-65%, normal wall motion, grade 1 diastolic dysfunction, mild AI  . HLD (hyperlipidemia)   . Hypertensive heart disease with CHF (congestive heart failure) ([place]) 04/08/2009  . Low testosterone   . Lung nodule    a. CT in 2015 >> PET in 12/15 not sugg of malignancy   . LVH (left ventricular hypertrophy)    a. Echo 12/14 Inferior and distal septal HK, mild LVH, EF 50-55%, mild MR, mild LAE  . Mild dementia   . Mitral regurgitation    Echo (4/11) with PISA ERO 0.3 cm^2 and regurgitant volume 41 mL (moderate MR). Echo (12/14) with mild MR.   . MS (multiple sclerosis) ([place])   . MS (multiple sclerosis) ([place])   . Right bundle branch block   .  Stroke ([place])   . Thoracic aortic aneurysm (HCC)    4.1 cm 2017  . Urinary retention with incomplete bladder emptying    receives botox injections and treatment for BPH through [facility]   Past Surgical History:  Procedure Laterality Date  . APPENDECTOMY    . EP IMPLANTABLE DEVICE N/A 06/23/2015   Procedure: Loop Recorder Insertion;  Surgeon: Deboraha Sprang, MD;  Location: [facility] CV LAB;  Service: Cardiovascular;  Laterality: N/A;  . HEMORRHOID SURGERY    . LEFT HEART CATHETERIZATION WITH CORONARY ANGIOGRAM N/A 01/05/2013   Procedure: LEFT HEART CATHETERIZATION WITH CORONARY ANGIOGRAM;  Surgeon: Larey Dresser, MD;  Location: [facility] CATH LAB;   Service: Cardiovascular;  Laterality: N/A;  . ROTATOR CUFF REPAIR    . TEE WITHOUT CARDIOVERSION N/A 06/23/2015   Procedure: TRANSESOPHAGEAL ECHOCARDIOGRAM (TEE);  Surgeon: Fay Records, MD;  Location: [facility] ENDOSCOPY;  Service: Cardiovascular;  Laterality: N/A;  . TONSILLECTOMY    . TOTAL KNEE ARTHROPLASTY Right   . TRANSURETHRAL RESECTION OF PROSTATE     Current Outpatient Prescriptions on File Prior to Visit  Medication Sig Dispense Refill  . acetaminophen (TYLENOL) 325 MG tablet Take 650 mg by mouth every 6 (six) hours as needed (for pain or headaches).     Marland Kitchen apixaban (ELIQUIS) 5 MG TABS tablet Take 1 tablet (5 mg total) by mouth 2 (two) times daily. 180 tablet 3  . bisoprolol (ZEBETA) 10 MG tablet Take 1 tablet (10 mg total) by mouth daily. 90 tablet 3  . cholecalciferol (VITAMIN D) 400 units TABS tablet Take 400 Units by mouth daily.     . Cyanocobalamin (B-12) 1000 MCG CAPS Take 1,000 mcg by mouth daily.     Marland Kitchen doxazosin (CARDURA) 2 MG tablet Take 1 tablet (2 mg total) by mouth daily. 90 tablet 1  . folic acid (FOLVITE) 1 MG tablet Take 1 mg by mouth daily.    . furosemide (LASIX) 40 MG tablet TAKE 1 TABLET(40 MG) BY MOUTH DAILY 90 tablet 3  . losartan (COZAAR) 100 MG tablet Take 1 tablet (100 mg total) by mouth daily. 90 tablet 3  . MAGNESIUM CITRATE PO Take 0.5-1 Bottles by mouth once as needed (for mild constipation).     . Multiple Vitamins-Minerals (DRY EYE FORMULA) CAPS Place 1-2 drops into both eyes 4 (four) times daily as needed (for dry eyes).    . pantoprazole (PROTONIX) 40 MG tablet Take 1 tablet (40 mg total) by mouth daily. 90 tablet 3  . PROAIR HFA 108 (90 Base) MCG/ACT inhaler USE 1 TO 2 INHALATIONS EVERY 6 HOURS AS NEEDED FOR WHEEZING OR SHORTNESS OF BREATH 25.5 g 4  . rOPINIRole (REQUIP) 1 MG tablet TAKE 1 TABLET AT BEDTIME 90 tablet 3  . Tiotropium Bromide Monohydrate (SPIRIVA RESPIMAT) 2.5 MCG/ACT AERS Inhale 2 puffs into the lungs daily. 3 Inhaler 3  . venlafaxine XR  (EFFEXOR-XR) 150 MG 24 hr capsule TAKE 1 CAPSULE TWICE A DAY 180 capsule 3  . atorvastatin (LIPITOR) 40 MG tablet Take 1 tablet (40 mg total) by mouth daily. 90 tablet 3   No current facility-administered medications on file prior to visit.    Allergies  Allergen Reactions  . Acetylcholine     Patient was suspected of having "Brugada Syndrome": (BrS) is a genetic condition that results in abnormal electrical activity within the heart, increasing the risk of sudden cardiac death. Those affected may have episodes of passing out.  Tammi Sou [Sulfur] Other (See Comments)  Patient was suspected of having "Brugada Syndrome"  . Amitriptyline Other (See Comments)    Patient was suspected of having "Brugada Syndrome"  . Bupivacaine Other (See Comments)    Patient was suspected of having "Brugada Syndrome"  . Clomipramine Hcl Other (See Comments)    Patient was suspected of having "Brugada Syndrome"  . Cocaine Other (See Comments)    Patient was suspected of having "Brugada Syndrome"  . Desipramine Other (See Comments)    Patient was suspected of having "Brugada Syndrome"  . Ergonovine Other (See Comments)    Patient was suspected of having "Brugada Syndrome"  . Flecainide Other (See Comments)    Patient was suspected of having "Brugada Syndrome"  . Lithium Other (See Comments)    Patient was suspected of having "Brugada Syndrome"  . Loxapine Other (See Comments)    Patient was suspected of having "Brugada Syndrome"  . Nortriptyline Other (See Comments)    Patient was suspected of having "Brugada Syndrome"  . Oxcarbazepine Other (See Comments)    Patient was suspected of having "Brugada Syndrome"  . Procainamide Other (See Comments)    Patient was suspected of having "Brugada Syndrome"  . Procaine Other (See Comments)    Patient was suspected of having "Brugada Syndrome"  . Propafenone Other (See Comments)    Patient was suspected of having "Brugada Syndrome"  . Propofol Other  (See Comments)    Patient was suspected of having "Brugada Syndrome"  . Trifluoperazine Other (See Comments)    Patient was suspected of having "Brugada Syndrome"   Social History   Social History  . Marital status: Married    Spouse name: N/A  . Number of children: N/A  . Years of education: N/A   Occupational History  . retired Retired   Social History Main Topics  . Smoking status: Former Smoker    Packs/day: 2.00    Years: 40.00    Types: Cigarettes    Quit date: 01/04/1993  . Smokeless tobacco: Former Systems developer    Types: Chew  . Alcohol use 15.0 oz/week    25 Cans of beer per week     Comment: 12 pack a week  . Drug use: No  . Sexual activity: Not on file   Other Topics Concern  . Not on file   Social History Narrative   Retired from the [employer] after 20 years   [place] Veteran      Review of Systems  All other systems reviewed and are negative.      Objective:   Physical Exam  Constitutional: He is oriented to person, place, and time. He appears well-developed and well-nourished. No distress.  Cardiovascular: Normal rate, regular rhythm, normal heart sounds and intact distal pulses.   No murmur heard. Pulmonary/Chest: Effort normal and breath sounds normal. No respiratory distress. He has no wheezes. He has no rales.  Neurological: He is alert and oriented to person, place, and time. He has normal reflexes. No cranial nerve deficit. He exhibits normal muscle tone. Coordination normal.  Skin: He is not diaphoretic.  Psychiatric: He has a normal mood and affect. His behavior is normal. Judgment and thought content normal.  Vitals reviewed.         Assessment & Plan:  Memory loss due to medical condition  Patient has age-related memory loss compounded by multiple ischemic strokes, compounded by progressive multiple sclerosis, compounded by polypharmacy and chronic narcotic use all of which makes him a danger to drive. In general, I recommended  that the  patient not drive. Legally, I believe he would still qualify for restricted status. He must drive less than 45 miles an hour. He must avoid Interstate driving or driving on highways and remain driving only on 2 lane roads where there is less travel. I would not recommend driving at night or during conditions that would compromise his reaction time. After discussing these restrictions I explained to the patient and his wife that if he were my father, I would not recommend driving.

## 2016-10-20 ENCOUNTER — Telehealth: Payer: Self-pay | Admitting: Neurology

## 2016-10-20 DIAGNOSIS — R51 Headache: Principal | ICD-10-CM

## 2016-10-20 DIAGNOSIS — R519 Headache, unspecified: Secondary | ICD-10-CM

## 2016-10-20 NOTE — Telephone Encounter (Signed)
Called pt back at 12:34pm but no one picked up the phone. I have left VM for them to call back. Thanks.  Rosalin Hawking, MD PhD Stroke Neurology 10/20/2016 12:35 PM

## 2016-10-20 NOTE — Telephone Encounter (Signed)
RN call patients wife on dpr. Pt's wife stated he has the headache for over a week, and a half. Pt has history of three strokes in the past year. Pt's wife stated he has never complain of headaches before. He has had one when he wakes up and it never goes away. Pts wife states he is not having any blurred vision,dizzy spells, blood pressure was not high, or low. He is not a diabetic. Pt does have some memory problems from the multiple strokes, but its not getting worse. He is not confused, and not having any new deficits with his walking ability. Rn stated to wife Dr. Erlinda Hong is not in the office this week.Rn stated a MD will look at note and evaluate what needs to be done.

## 2016-10-20 NOTE — Telephone Encounter (Signed)
Patient's wife is calling. Patient is complaining of a headache x2 weeks. He has had 3 strokes in the past year and is concerned. I advised Dr. Erlinda Hong is out of the office but she would like a call back from the nurse to discuss.

## 2016-10-21 ENCOUNTER — Ambulatory Visit (INDEPENDENT_AMBULATORY_CARE_PROVIDER_SITE_OTHER): Payer: Medicare Other | Admitting: *Deleted

## 2016-10-21 ENCOUNTER — Telehealth: Payer: Self-pay | Admitting: Family Medicine

## 2016-10-21 DIAGNOSIS — I639 Cerebral infarction, unspecified: Secondary | ICD-10-CM | POA: Diagnosis not present

## 2016-10-21 NOTE — Progress Notes (Signed)
Carelink Summary Report / loop Recorder  

## 2016-10-21 NOTE — Telephone Encounter (Signed)
Discussed with pt wife over the phone. She stated that patient has had headache for the last one half weeks. Headache normal he started on waking up in the morning, whole head hurts, lasting all day long, however patient was able to sleep without difficulties. Wife noted blood pressure high recent days in the morning, yesterday 165/98, today 176/119. He is on doxazosin, Lasix, losartan for BP control. He was on bisoprolol in the past, however recently it cannot be refilled due to medication not available anymore. Tylenol does not help, he was taken hydrocodone yesterday, helped some but not lasting. This morning again he had HA and back to bed. Wife denies any numbness, weakness, vision changes, speech difficulties. No significant photophobia, phonophobia, no nausea or vomiting. No significant mental status changes. Wife only checks blood pressure in the morning, did not check BP after meds were at night. Compliant with eliquis. Pt has no hx of migraine or headache.   Told wife that his HA could be associated with high BP or with brain bleed. Due to HTN and on eliquis, we need CT head ASAP to rule out ICH or SAH. Also, wife needs to check BP 3 times a day to see whether HA is related to high BP. If constantly high even with BP meds, she needs to call Dr. Dennard Schaumann to adjust BP meds. Wife expressed appreciation and understanding.  Rosalin Hawking, MD PhD Stroke Neurology 10/21/2016 2:14 PM  Orders Placed This Encounter  Procedures  . CT HEAD WO CONTRAST    Needs to rule out ICH or SAH. Pt with HTN and on eliquis.    Standing Status:   Future    Standing Expiration Date:   01/21/2018    Order Specific Question:   Preferred imaging location?    Answer:   [id number]    Order Specific Question:   Radiology Contrast Protocol - do NOT remove file path    Answer:   [email or web address]

## 2016-10-21 NOTE — Telephone Encounter (Signed)
Patients wife calling regarding dougs blood pressure being too high , would like a call back asap  At [phone]

## 2016-10-21 NOTE — Telephone Encounter (Signed)
Perfect. Thank you, Raquel Sarna.  Rosalin Hawking, MD PhD Stroke Neurology 10/21/2016 3:03 PM

## 2016-10-21 NOTE — Telephone Encounter (Signed)
Your welcome.

## 2016-10-21 NOTE — Addendum Note (Signed)
Addended by: Rosalin Hawking on: 10/21/2016 02:17 PM   Modules accepted: Orders

## 2016-10-21 NOTE — Telephone Encounter (Signed)
I spoke to Palmer at [facility] [place] she stated that tomorrow the patient can walk in at any time from 8:30 to 4:30 and open during lunch at either of the location. I called and spoke to patient wife and informed her that they can go by any time tomorrow at [place] she stated she will go first thing in the morning.

## 2016-10-21 NOTE — Telephone Encounter (Signed)
Pt's wife returned Dr Phoebe Sharps call.

## 2016-10-22 ENCOUNTER — Ambulatory Visit
Admission: RE | Admit: 2016-10-22 | Discharge: 2016-10-22 | Disposition: A | Discharge status: Home / Self Care | Payer: Medicare Other | Source: Ambulatory Visit | Attending: Neurology | Admitting: Neurology

## 2016-10-22 ENCOUNTER — Telehealth: Payer: Self-pay | Admitting: *Deleted

## 2016-10-22 DIAGNOSIS — R519 Headache, unspecified: Secondary | ICD-10-CM

## 2016-10-22 DIAGNOSIS — R51 Headache: Principal | ICD-10-CM

## 2016-10-22 LAB — CUP PACEART REMOTE DEVICE CHECK
Implantable Pulse Generator Implant Date: 20170619
MDC IDC SESS DTM: 20181018123926

## 2016-10-22 MED ORDER — HYDROCHLOROTHIAZIDE 25 MG PO TABS: 25.0000 mg | ORAL_TABLET | Freq: Every day | ORAL | 0 refills | Status: DC

## 2016-10-22 NOTE — Telephone Encounter (Signed)
Add hctz 25 mg poqday and recheck bp in 1 week.

## 2016-10-22 NOTE — Telephone Encounter (Signed)
Wife on the way home with patient.  Just had CT done.  Has been having continuous headache.  BP has been up  169/90 and 176/119 yesterday  169/119 today.  She said she gave him an extra Cardura last night.  Please advise very concerned.

## 2016-10-22 NOTE — Telephone Encounter (Signed)
Called # listed below no answer and no vm - called listed cell # and [facility]

## 2016-10-22 NOTE — Telephone Encounter (Signed)
Wife made aware of provider recommendations.  HCTZ sent to local pharmacy.  To call in 1 week with BP readings.

## 2016-10-22 NOTE — Telephone Encounter (Signed)
Called patient, spoke with wife, [name] [name] on [place]. Advised her Dr Leta Baptist read CT head scan results. Per Dr Leta Baptist there are no acute or new findings. Wife verbalized understanding, stated his BP has been up, and he's been having headaches. She stated his BP is coming down now, and he's waiting on call from PCP .   She verbalized appreciation of call.

## 2016-11-15 ENCOUNTER — Ambulatory Visit: Payer: Medicare Other | Admitting: Neurology

## 2016-11-15 ENCOUNTER — Other Ambulatory Visit: Payer: Self-pay | Admitting: Family Medicine

## 2016-11-15 NOTE — Telephone Encounter (Signed)
Medication refilled per protocol. 

## 2016-11-22 ENCOUNTER — Ambulatory Visit (INDEPENDENT_AMBULATORY_CARE_PROVIDER_SITE_OTHER): Payer: Medicare Other | Admitting: *Deleted

## 2016-11-22 DIAGNOSIS — I639 Cerebral infarction, unspecified: Secondary | ICD-10-CM | POA: Diagnosis not present

## 2016-11-23 ENCOUNTER — Telehealth: Payer: Self-pay | Admitting: Cardiology

## 2016-11-23 NOTE — Telephone Encounter (Signed)
LMOVM requesting that pt send manual transmission b/c home monitor has not updated in at least 14 days.    

## 2016-11-23 NOTE — Progress Notes (Signed)
Carelink Summary Report / Loop Recorder 

## 2016-11-24 ENCOUNTER — Telehealth: Payer: Self-pay | Admitting: Family Medicine

## 2016-11-24 MED ORDER — HYDROCODONE-ACETAMINOPHEN 10-325 MG PO TABS: 1.0000 | ORAL_TABLET | Freq: Four times a day (QID) | ORAL | 0 refills | Status: DC | PRN

## 2016-11-24 NOTE — Telephone Encounter (Signed)
Ok to refill 

## 2016-11-24 NOTE — Telephone Encounter (Signed)
RX printed, left up front and patient aware to pick up  

## 2016-11-24 NOTE — Telephone Encounter (Signed)
Refill on hydrocodone

## 2016-11-24 NOTE — Telephone Encounter (Signed)
ok 

## 2016-12-01 ENCOUNTER — Ambulatory Visit (INDEPENDENT_AMBULATORY_CARE_PROVIDER_SITE_OTHER): Payer: Medicare Other | Admitting: Neurology

## 2016-12-01 ENCOUNTER — Encounter: Payer: Self-pay | Admitting: Neurology

## 2016-12-01 VITALS — BP 148/83 | HR 95 | Wt 238.0 lb

## 2016-12-01 DIAGNOSIS — I639 Cerebral infarction, unspecified: Secondary | ICD-10-CM

## 2016-12-01 DIAGNOSIS — R4189 Other symptoms and signs involving cognitive functions and awareness: Secondary | ICD-10-CM

## 2016-12-01 DIAGNOSIS — Z8673 Personal history of transient ischemic attack (TIA), and cerebral infarction without residual deficits: Secondary | ICD-10-CM | POA: Diagnosis not present

## 2016-12-01 DIAGNOSIS — R519 Headache, unspecified: Secondary | ICD-10-CM | POA: Insufficient documentation

## 2016-12-01 DIAGNOSIS — G4733 Obstructive sleep apnea (adult) (pediatric): Secondary | ICD-10-CM | POA: Diagnosis not present

## 2016-12-01 DIAGNOSIS — G35 Multiple sclerosis: Secondary | ICD-10-CM

## 2016-12-01 DIAGNOSIS — R51 Headache: Secondary | ICD-10-CM

## 2016-12-01 DIAGNOSIS — I10 Essential (primary) hypertension: Secondary | ICD-10-CM | POA: Diagnosis not present

## 2016-12-01 NOTE — Patient Instructions (Addendum)
-   continue eliquis and lipitor for stroke prevention - follow up loop recorder. - recommend driver evaluation to determine the driving privileges.  - will do sleep study to rule out sleep apnea - spread BP meds to help morning BPs. Take HCTZ and bisoprolol in the morning, and cozaar and doxazosin at night.  - continue follow up with oncology  - Follow up with your primary care physician for stroke risk factor modification. Recommend maintain blood pressure goal 130-140/80-90, diabetes with hemoglobin A1c goal below 7.0% and lipids with LDL cholesterol goal below 70 mg/dL.  - check BP at home and record. - use walker to walk to avoid fall.  - healthy diet and regular exercise - observe the depression, let us know if need referral to psychiatrist.  - OK for mowing lawn, but be slow and careful.  - also once HA improves, may consider aricept for cognitive impairment - follow up in 3 months.

## 2016-12-01 NOTE — Progress Notes (Signed)
STROKE NEUROLOGY FOLLOW UP NOTE  NAME: [name] [name] DOB: Apr 01, 1940  REASON FOR VISIT: stroke follow up HISTORY FROM: pt and wife and chart  Today we had the pleasure of seeing [name] [name] in follow-up at our [facility]. Pt was accompanied by wife.   History Summary [name] [name] is a 76 y.o. male with history of MS diagnosis in 1963 not on meds, CAD, thoracic AA, history of lung nodule benign, urethral carcinoma following with [facility], diastolic CHF, HTN, HLD and GERD admitted on 06/15/2015 for slurred speech. 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 CT head, carotid Doppler, TTE were all negative. LDL 91 and A1c 6.4. His aspirin 81 changed to 325, resumed Lipitor 20, and he was discharged home outpatient PT OT and plan with outpatient TEE and Loop recorder. However, he was readmitted on 06/20/2015 for presyncope with UTI, generalized weakness, dysarthria and gait instability. MRI showed essentially the same infarct as last admission. Symptoms consider as recrudescence of previous stroke in the setting of infection and presyncope. DVT and TEE negative without PFO. Loop recorder placed. Aspirin was changed to Plavix on discharge.  Follow up 08/14/15 - the patient has been doing well. Wife complains of mild memory loss after stroke. Blood pressure today 105/71. On Plavix and Lipitor, without side effect. Not candidate for RESPECT ESUS due to urethral cancer.     Follow up 11/17/15 - he has been doing well. Had loop recorder placed but has some signal problems and no report since July. Pt and wife are contacting cardiology. On plavix and lipitor and no side effect. Today BP 123/80 in clinic but at home still high between 140-155. He is on 3 BP meds, following with PCP.   Follow up 05/17/16 - pt was admitted on 04/04/16 for R sided weakness. HereceivedIV t-PA. MRI showed small right cerebellar and right occipital lobe  with left ACA punctate infarct, again embolic pattern. CTA head and neck only showed left VA stenosis. EF 55-60%. TCD bubble study negative for PFO, and loop recorder showed no afib. LDL 61 and A1C 5.6. His plavix changed to aggrenox on discharge. CTA chest showed fusiform dilatation of ascending aorta and mild to moderate thoracic descending aorta atherosclerosis.  He follows with [facility] for urethral cancer and recurrent UTI. So far no recurrence or metastasis found. He has appointment with them in 07/2016.  Admitted on 06/21/16 for aphasia and left facial droop. MRI showed left MCA acute left lateral parietal and left superior parietal infarcts, embolic pattern. MRA, TTE and DVT negative. Loop recorder again no afib. Pan CT no evidence of cancer metastasis. LDL 36 and A1C 5.6. After discussion with pt and family, we started eliquis 5mg  bid for stroke prevention.   Follow up 08/24/16 - During the interval time, pt has been doing well. Speech improved with home self exercise. However, BP was low today 89/59, asymptomatic. Usually his BP at home 110-120s. Today also mildly sleepy and wife stated that he has insomnia.   Hx of stroke  06/15/2015 for right MCA infarcts at right insular and posterior frontal lobe. CT head, carotid Doppler, TTE were all negative. LDL 91 and A1c 6.4. His aspirin 81 changed to 325, resumed Lipitor 20  06/20/2015 recrudescence of previous stroke in the setting of infection and presyncope. DVT and TEE negative without PFO. Loop recorder placed. Aspirin was changed to Plavix on discharge.   04/04/16 small  right cerebellar and right occipital lobe with left ACA punctate infarct s/p tPA, again embolic pattern. CTA head and neck only showed left VA stenosis. EF 55-60%. TCD bubble study negative for PFO, and loop recorder showed no afib. LDL 61 and A1C 5.6. His plavix changed to aggrenox on discharge. CTA chest showed fusiform dilatation of ascending aorta and mild to  moderate thoracic descending aorta atherosclerosis, which can not explain the stroke.  06/21/16 left MCA including acute left lateral parietal and left superior parietal infarcts, embolic pattern. MRA, TTE and DVT negative. Loop recorder again no afib. Pan CT no evidence of cancer metastasis. LDL 36 and A1C 5.6. Started on eliquis 5mg  bid for stroke prevention.   Interval History During the interval time, pt has been doing well except started to have morning HAs.  As per wife, patient complains of the morning excruciating headache, has to take hydrocodone on Tylenol, headache gradually resolved until lunch.  Patient has difficulty describing characteristics of the headache, however stated whole brain aching pain, no phonophobia, photophobia, nausea vomiting. CT head no bleeding.  Did not check BP regularly.  On Eliquis noAs per wife, patient has snoring, but no significant apnea.  Never had sleep study before. On Eliquis no side effects.    As per wife, patient continued to have depression due to restraint from driving, not moving, guns, and several other activities.  Patient  felt his whole life is gone and cannot figure out anything else to do.  However at the time, patient and family declined psychiatry referral.  They will let me know when they are ready.  Wife still complains of patient cognitive impairment, short memory loss.  Has not been on Aricept or Namenda in the past.  REVIEW OF SYSTEMS: Full 14 system review of systems performed and notable only for those listed below and in HPI above, all others are negative:  Constitutional: Activity change, fatigue Cardiovascular: Ear/Nose/Throat: Ringing in ears Skin:  Eyes:   Respiratory:  SOB, wheezing, cough Gastroitestinal:  Constipation Genitourinary: incontinence of bladder, frequency of urination Hematology/Lymphatic:   Endocrine: Cold intolerance, excessive eating Musculoskeletal: Joint pain, walking difficulty Allergy/Immunology:     Neurological:  Memory loss, HA, speech difficulty, weakness Psychiatric: confusion, decreased concentration, agitation, nervous/anxious Sleep: Restless leg, daytime sleepiness  The following represents the patient's updated allergies and side effects list: Allergies  Allergen Reactions  . Acetylcholine     Patient was suspected of having "Brugada Syndrome": (BrS) is a genetic condition that results in abnormal electrical activity within the heart, increasing the risk of sudden cardiac death. Those affected may have episodes of passing out.  Marland Kitchen Alcohol-Sulfur [Sulfur] Other (See Comments)    Patient was suspected of having "Brugada Syndrome"  . Amitriptyline Other (See Comments)    Patient was suspected of having "Brugada Syndrome"  . Bupivacaine Other (See Comments)    Patient was suspected of having "Brugada Syndrome"  . Clomipramine Hcl Other (See Comments)    Patient was suspected of having "Brugada Syndrome"  . Cocaine Other (See Comments)    Patient was suspected of having "Brugada Syndrome"  . Desipramine Other (See Comments)    Patient was suspected of having "Brugada Syndrome"  . Ergonovine Other (See Comments)    Patient was suspected of having "Brugada Syndrome"  . Flecainide Other (See Comments)    Patient was suspected of having "Brugada Syndrome"  . Lithium Other (See Comments)    Patient was suspected of having "Brugada Syndrome"  . Loxapine Other (  See Comments)    Patient was suspected of having "Brugada Syndrome"  . Nortriptyline Other (See Comments)    Patient was suspected of having "Brugada Syndrome"  . Oxcarbazepine Other (See Comments)    Patient was suspected of having "Brugada Syndrome"  . Procainamide Other (See Comments)    Patient was suspected of having "Brugada Syndrome"  . Procaine Other (See Comments)    Patient was suspected of having "Brugada Syndrome"  . Propafenone Other (See Comments)    Patient was suspected of having "Brugada Syndrome"  .  Propofol Other (See Comments)    Patient was suspected of having "Brugada Syndrome"  . Trifluoperazine Other (See Comments)    Patient was suspected of having "Brugada Syndrome"    The neurologically relevant items on the patient's problem list were reviewed on today's visit.  Neurologic Examination  A problem focused neurological exam (12 or more points of the single system neurologic examination, vital signs counts as 1 point, cranial nerves count for 8 points) was performed.  Blood pressure (!) 148/83, pulse 95, weight 238 lb (108 kg).  General - Well nourished, well developed, in no apparent distress.  Ophthalmologic - Sharp disc margins OU.   Cardiovascular - Regular rate and rhythm with no murmur.  Mental Status -  Awake, alert, orientation to time, place, and person were intact. Language including expression, naming, repetition, comprehension was assessed and found intact , mild dysarthria. Not able to backward spelling WORLD, but able to calculate Registration 3/3 and delayed recall 3/3 Fund of knowledge impaired.  Cranial Nerves II - XII - II - Visual field intact OU. III, IV, VI - Extraocular movements intact. V - Facial sensation intact bilaterally. VII - mild right facial asymmetry (chronic after right facial surgery in the past). VIII - Hearing & vestibular intact bilaterally. X - Palate elevates symmetrically, mild dysarthria. XI - Chin turning & shoulder shrug intact bilaterally. XII - Tongue protrusion intact.  Motor Strength - The patient's strength was normal in all extremities and pronator drift was absent.  Bulk was normal and fasciculations were absent.   Motor Tone - Muscle tone was assessed at the neck and appendages and was normal.  Reflexes - The patient's reflexes were 1+ in all extremities and he had no pathological reflexes.  Sensory - Light touch, temperature/pinprick, vibration and proprioception, and Romberg testing were assessed and were  normal.    Coordination - The patient had normal movements in the hands and feet with no ataxia or dysmetria.  Tremor was absent.  Gait and Station - walk without device, mildly stooped posturing  Data reviewed: I personally reviewed the images and agree with the radiology interpretations.  Ct Head Wo Contrast 06/20/2015   Stable atrophic and ischemic changes. No acute abnormality is noted.   MRI brain 06/15/15 Areas of acute infarction in the region of the insula and posterior frontal lobe on the right consistent with right MCA branch vessel occlusion. No swelling or hemorrhage. Extensive chronic small vessel ischemic changes elsewhere throughout the brain, progressive since the previous exam. Given the history of multiple sclerosis clinically, some of these white matter lesions could relate to chronic demyelination.  Mr Kizzie Fantasia Contrast 06/20/2015   Subcentimeter focus of restricted diffusion in the superior RIGHT insula not present previously, representing progression of ischemia. Otherwise stable multifocal areas of acute RIGHT MCA infarction. No interval development of large vessel occlusion, or significant mass effect. Mild postcontrast enhancement, consistent with the subacute time course. There is  no significant change on DWI comparing with previous MRI. The "new" DWI signal likely the same DWI as before just due to different cut.   CT Angio Head 06/16/2015 CT HEAD: Evolving small RIGHT frontal/ MCA territory infarct. Moderate to severe white matter changes better characterized on recent MRI of the brain. CTA HEAD: Negative.  2D Echocardiogram 06/16/15 - Left ventricle: The cavity size was normal. There was mild focal basal hypertrophy of the septum. Systolic function was normal. The estimated ejection fraction was in the range of 60% to 65%. Wall motion was normal; there were no regional wall motion abnormalities. Doppler parameters are consistent with abnormal left  ventricular relaxation (grade 1 diastolic dysfunction). - Aortic valve: There was mild regurgitation. Impressions: - No cardiac source of emboli was indentified.  Carotid Doppler 06/16/15 There is 1-39% bilateral ICA stenosis. Vertebral artery flow is antegrade.   LE venous doppler - There is no DVT or SVT noted in the bilateral lower extremities.   TEE - normal EF, no SOE, no PFO  CT chest 06/05/15 1. The nodules are stable from 06/26/2013. More spiculated nodules inferomedially in the right middle lobe were not previously positive on PET scan and accordingly are likely benign. The 5 by 6 mm nodule is also stable over the past 2 years. This 2 year stability is compatible with benign process, and based on current guidelines, further follow up surveillance is not required. 2. 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 Recommend annual imaging followup by CTA or MRA. 3. Coronary, aortic arch, and branch vessel atherosclerotic vascular disease.  Ct Head Code Stroke W/o Cm 04/04/2016 1. No acute abnormality 2. ASPECTS is 10  Ct Angio Head W Or Wo Contrast Ct Angio Neck W Or Wo Contrast 04/04/2016 No significant carotid artery stenosis. Anterior and middle cerebral arteries patent without significant stenosis or occlusion Severe stenosis origin of the left vertebral artery and mild stenosis origin of the right vertebral artery. Posterior intracranial circulation is patent.   Mr Brain Wo Contrast 04/05/2016 Subcentimeter acute RIGHT cerebellar and RIGHT occipital lobe infarcts. Old small RIGHT frontal lobe infarct and bold small cerebellar infarcts. Moderate chronic small vessel ischemic disease.   2D Echocardiogram  - Left ventricle: The cavity size was normal. Wall thickness wasnormal. Systolic function was normal. The estimated ejectionfraction was in the range of 55% to 60%. Wall motion was normal;there were no regional wall motion abnormalities. Leftventricular  diastolic function parameters were normal, whenadjusted for age. - Aortic valve: There was mild regurgitation. - Mitral valve: There was mild regurgitation.  TCD with bubble study No HITS. No PFO  CT chest 04/28/16 1. Stable fusiform dilatation of the ascending aorta measuring 4.0 cm in maximum diameter. Recommend annual imaging followup by CTA or MRA. This recommendation follows 2010 ACCF/AHA/AATS/ACR/ASA/SCA/SCAI/SIR/STS/SVM Guidelines for the Diagnosis and Management of Patients with Thoracic Aortic Disease. Circulation. 2010; 121: W431-V400. 2. Moderate thoracic aortic atherosclerosis. 3. Coronary artery calcifications. 4. Stable pulmonary nodules on the right consistent with prior inflammation and probable subsequent scarring. No new or enlarging pulmonary nodule is seen.  Ct Head Wo Contrast  06/21/2016 IMPRESSION: Chronic ischemic changes and mild atrophic changes similar to that seen on previous exam. No acute abnormality is identified.   Ct Chest W Contrast Ct Abdomen Pelvis W Contrast 06/22/2016 IMPRESSION: 1. Solid nodule within the perifissural right middle lobe is stable from previous exam. The nodular densities within the right middle lobe have decreased in size when compared with 06/05/15 favoring a benign  process. 2. Borderline enlarged sub- carinal lymph node appears mildly increased in size from previous exam. 11 mm on today's study versus 7 mm previously. 3. No mass or adenopathy identified within the abdomen or pelvis. 4.  Aortic Atherosclerosis (ICD10-I70.0).  Mr Brain Wo Contrast 06/21/2016 IMPRESSION: 1. Small acute/early subacute infarct within the left lateral parietal lobe and subcentimeter focus in left superior parietal lobe. No hemorrhage or significant associated mass effect. 2. Background of moderate chronic microvascular ischemic changes and parenchymal volume loss of the brain. Several chronic lacunar infarcts in basal ganglia and cerebellum.  Mr Jodene Nam  Head/brain Wo Cm 06/22/2016 IMPRESSION: Negative intracranial MRA. No large or proximal arterial branch occlusion. No high-grade or correctable stenosis.  LE venous doppler - No evidence of deep vein thrombosis or baker's cysts bilaterally.  TTE 04/2016 - Left ventricle: The cavity size was normal. There was mild focal basal hypertrophy of the septum. Systolic function was normal. The estimated ejection fraction was in the range of 60% to 65%. Wall motion was normal; there were no regional wall motion abnormalities. Doppler parameters are consistent with abnormal left ventricular relaxation (grade 1 diastolic dysfunction). - Aortic valve: There was mild regurgitation. Impressions: - No cardiac source of emboli was indentified.  Ct Head Wo Contrast   10/22/2016 No acute findings. No intracranial hemorrhage. Stable chronic small vessel ischemic disease.   Component     Latest Ref Rng & Units 06/16/2015 06/21/2015 06/26/2015  Cholesterol     0 - 200 mg/dL 185 140   Triglycerides     <150 mg/dL 256 (H) 239 (H)   HDL Cholesterol     >40 mg/dL 43 33 (L)   Total CHOL/HDL Ratio     RATIO 4.3 4.2   VLDL     0 - 40 mg/dL 51 (H) 48 (H)   LDL (calc)     0 - 99 mg/dL 91 59   Hemoglobin A1C     <5.7 % 6.4 (H)  6.3 (H)  Mean Plasma Glucose     mg/dL 137  134   Component     Latest Ref Rng & Units 04/05/2016  Cholesterol     0 - 200 mg/dL 134  Triglycerides     <150 mg/dL 150 (H)  HDL Cholesterol     >40 mg/dL 43  Total CHOL/HDL Ratio     RATIO 3.1  VLDL     0 - 40 mg/dL 30  LDL (calc)     0 - 99 mg/dL 61  Hemoglobin A1C     4.8 - 5.6 % 5.6  Mean Plasma Glucose     mg/dL 114   Component     Latest Ref Rng & Units 06/22/2016  Cholesterol     0 - 200 mg/dL 122  Triglycerides     <150 mg/dL 220 (H)  HDL Cholesterol     >40 mg/dL 42  Total CHOL/HDL Ratio     RATIO 2.9  VLDL     0 - 40 mg/dL 44 (H)  LDL (calc)     0 - 99 mg/dL 36  Hemoglobin A1C     4.8 - 5.6  % 6.0 (H)  Mean Plasma Glucose     mg/dL 126    Assessment: As you may recall, he is a 76 y.o. Caucasian male with PMH of MS diagnosis in 1963 not on meds, CAD, thoracic AA, history of lung nodule benign, urethral carcinoma following with [facility], diastolic CHF, HTN, HLD and GERD admitted  on 06/15/2015 for right MCA branch vessel occlusion with infarcts at right insular and posterior frontal lobe. CT head, carotid Doppler, TTE were all negative. LDL 91 and A1c 6.4. His aspirin 81 changed to 325, resumed Lipitor 20, and he was discharged home outpatient PT OT and plan with outpatient TEE and Loop recorder. However, he was readmitted on 06/20/2015 for presyncope with UTI, generalized weakness, dysarthria and gait instability. MRI showed essentially the same infarct as last admission. Symptoms consider as recrudescence of previous stroke in the setting of infection and presyncope. DVT and TEE negative without PFO. Loop recorder placed. Aspirin was changed to Plavix on discharge.   Admitted on 04/04/16 and receivedIV t-PA. MRI showed small right cerebellar and right occipital lobe with left ACA punctate infarct, again embolic pattern. CTA head and neck only showed left VA stenosis. EF 55-60%. TCD bubble study negative for PFO, and loop recorder showed no afib. LDL 61 and A1C 5.6. His plavix changed to aggrenox on discharge. CTA chest showed fusiform dilatation of ascending aorta and mild to moderate thoracic descending aorta atherosclerosis, which can not explain the stroke.Marland Kitchen   He follows with [facility] for urethral cancer and recurrent UTI. So far no recurrence or metastasis found. Admitted on 06/21/16 for aphasia and left facial droop. MRI showed left MCA acute left lateral parietal and left superior parietal infarcts, embolic pattern. MRA, TTE and DVT negative. Loop recorder again no afib. Pan CT no evidence of cancer metastasis. LDL 36 and A1C 5.6. After discussion with pt and family, we started  eliquis 5mg  bid for stroke prevention. During the interval time, pt has been doing well. Speech improved with home self exercise.  Regarding previous MS diagnosis in 1960s, patient has not been on any MS medication, no MS relapse, no significant symptoms of MS, I am not sure about the diagnosis, MRI white matter changes could be small vessel etiology instead of chronic demyelination. Do not think he needs of initiating MS treatment at this time.  Complained of morning HAs. CT head no bleeding. Recommend to check BP at home, and spread BP meds to cover morning BP better. Will do sleep study to rule out OSA.   Plan:  - continue eliquis and lipitor for stroke prevention - follow up loop recorder. - recommend driver evaluation to determine the driving privileges.  - will do sleep study to rule out sleep apnea - spread BP meds to help morning BPs. Take HCTZ and bisoprolol in the morning, and cozaar and doxazosin at night.  - continue follow up with oncology  - Follow up with your primary care physician for stroke risk factor modification. Recommend maintain blood pressure goal 130-140/80-90, diabetes with hemoglobin A1c goal below 7.0% and lipids with LDL cholesterol goal below 70 mg/dL.  - check BP at home and record. - use walker to walk to avoid fall.  - healthy diet and regular exercise - observe the depression, let us know if need referral to psychiatrist.   - also once HA improves, may consider aricept for cognitive impairment - follow up in 3 months.   I spent more than 25 minutes of face to face time with the patient. Greater than 50% of time was spent in counseling and coordination of care. We discussed morning HA, BP monitoring, sleep study and depression management.   Orders Placed This Encounter  Procedures  . Ambulatory referral to Sleep Studies    Referral Priority:   Routine    Referral Type:  Consultation    Referral Reason:   Specialty Services Required    Number of Visits  Requested:   1    No orders of the defined types were placed in this encounter.   Patient Instructions  - continue eliquis and lipitor for stroke prevention - follow up loop recorder. - recommend driver evaluation to determine the driving privileges.  - will do sleep study to rule out sleep apnea - spread BP meds to help morning BPs. Take HCTZ and bisoprolol in the morning, and cozaar and doxazosin at night.  - continue follow up with oncology  - Follow up with your primary care physician for stroke risk factor modification. Recommend maintain blood pressure goal 130-140/80-90, diabetes with hemoglobin A1c goal below 7.0% and lipids with LDL cholesterol goal below 70 mg/dL.  - check BP at home and record. - use walker to walk to avoid fall.  - healthy diet and regular exercise - observe the depression, let us know if need referral to psychiatrist.  - OK for mowing lawn, but be slow and careful.  - also once HA improves, may consider aricept for cognitive impairment - follow up in 3 months.    Rosalin Hawking, MD PhD [facility] [address] [place], [place] [address] [phone]

## 2016-12-07 ENCOUNTER — Ambulatory Visit (INDEPENDENT_AMBULATORY_CARE_PROVIDER_SITE_OTHER): Payer: Medicare Other | Admitting: Family Medicine

## 2016-12-07 ENCOUNTER — Encounter: Payer: Self-pay | Admitting: Family Medicine

## 2016-12-07 VITALS — BP 138/80 | HR 94 | Temp 97.6°F | Resp 20 | Ht 71.0 in | Wt 232.0 lb

## 2016-12-07 DIAGNOSIS — I10 Essential (primary) hypertension: Secondary | ICD-10-CM | POA: Diagnosis not present

## 2016-12-07 DIAGNOSIS — G4452 New daily persistent headache (NDPH): Secondary | ICD-10-CM

## 2016-12-07 DIAGNOSIS — I639 Cerebral infarction, unspecified: Secondary | ICD-10-CM

## 2016-12-07 DIAGNOSIS — G4710 Hypersomnia, unspecified: Secondary | ICD-10-CM | POA: Diagnosis not present

## 2016-12-07 NOTE — Progress Notes (Signed)
Subjective:    Patient ID: [name] [name], male    DOB: 05/29/1940, 76 y.o.   MRN: [id number]  HPI  10/12/16 Wife brings him patient for evaluation to determine should he still be driving. Past medical history is significant for progressive multiple sclerosis, multiple ischemic strokes, chronic osteoarthritic pain on TID hydrocodone which contribute to this discussion. Physically, the patient has muscle strength 5 over 5 equal and symmetric in the arms and in the legs with normal reflexes. His physical reaction time is normal to catching a dropped object. Cerebellar exam is normal today however on Mini-Mental status exam, the patient tells me that the year is 1983. He is unable to correctly identify the month. He can recall 2 out of 3 objects on recall. He scores 3 out of 5 on world spelling in reverse. He is unable to perform serial sevens. It takes him 25 seconds to draw a clock although he does draw correctly. However he is unable to recognize patterns or to correctly copy intersecting pentagons.  At that time, my plan was: Patient has age-related memory loss compounded by multiple ischemic strokes, compounded by progressive multiple sclerosis, compounded by polypharmacy and chronic narcotic use all of which makes him a danger to drive. In general, I recommended that the patient not drive. Legally, I believe he would still qualify for restricted status. He must drive less than 45 miles an hour. He must avoid Interstate driving or driving on highways and remain driving only on 2 lane roads where there is less travel. I would not recommend driving at night or during conditions that would compromise his reaction time. After discussing these restrictions I explained to the patient and his wife that if he were my father, I would not recommend driving.  12/07/16 Patient is here today accompanied by his wife due to concerns over new persistent daily headache.  Review of the patient's chart reveals that he  has been seeing a neurologist for persistent daily headache.  Patient has had a CT scan of the brain in October because the headache was ruled out intracranial hemorrhage or space-occupying lesion in the brain.  As best I can gather from the patient's wife description, the thought is that his blood pressure medication or fluctuations in his blood pressure could possibly be causing his headache.  Patient is currently taking losartan, hydrochlorothiazide, Cardura.  He was on bisoprolol however he has been out of this medication for more than 4 months due to a pharmacy backorder.  His last medication change was Cardura which occurred earlier this summer.  Headaches apparently began around September.  Patient nor his wife are unable to provide home blood pressure readings.  However from review of his chart, it seems that his blood pressure is slightly elevated typically in the 564-332 range systolic.  However the blood pressure does not appear to be high enough to account for the headaches.  Furthermore only 6% of patients who take Cardura report headaches and he started the medication almost 3 months prior to the headaches beginning making me think that this is not a medication side effect which was the wife's primary concern.  Patient states that he wakes up with a headache every morning.  The location can vary.  It can be on the top of his scalp, and either temple, and his occiput.  It does not seem to focus in one area.  It is nonpulsatile.  It is more of a pressure-like sensation in the brain.  There  is no photophobia.  There is no phonophobia.  On encounter today, the patient has noticeable dysarthria which seems more pronounced than on previous exams.  Otherwise there are no new neurologic deficits.  Patient is currently taking extra strength Tylenol for the headaches on a daily basis.  He will also use hydrocodone.  His prescription for hydrocodone is initially intended for osteoarthritic pain.  He is written  for hydrocodone every 6 hours as needed and is given 100 tablets to last a month.  He typically takes 3 tablets a day or this is how it was intended.  However when I questioned the patient, he is self administering the medication and will frequently take 2 or 3 pills at a time and then take no further pain medication the rest of the day.  This is completely new to me.  I explained the patient that this is extremely dangerous and the medication is not to be administered this way.  Furthermore because of the patient's underlying memory issues and cognitive impairment, I believe she needs to monitor the administration of his pain medication and ensure that he is only taking it every 6 hours.  However I do not believe this is the cause of his headache.  He also reports hypersomnolence.  He wakes up with a headache usually worse in the morning.  He never feels rested.  He can easily fall asleep throughout the day after eating, watching TV, reading.  In fact he will take a nap every day afternoon.  On exam, he does have a large diameter neck in a relatively short mandible.  His airway is narrowed due to soft tissue in the palate and a relatively large tongue.  Therefore I believe he is at high risk for obstructive sleep apnea.  This could possibly be explaining his daily headaches, it could even explain his difficulty managing his blood pressure and possibly even some of his confusion and fatigue and worsening cognitive impairment.  According to the wife, the patient's neurologist as scheduled a sleep study however this will not occur for more than a month.  She is questioning about starting him on a daily medication to prevent migraine headaches such as Topamax.  Past Medical History:  Diagnosis Date  . Arthritis    s/p TKR  . Bladder cancer (HCC)    [facility], [facility] low grade papillary urothileal carcinoma  . BPH (benign prostatic hyperplasia)   . Brugada syndrome    Possible Type II Brugada ECG pattern. No family  history of SCD, no syncope, no tachypalpitations.  Marland Kitchen CAD (coronary artery disease)    a. LHC 1/15 - mid LCx 50 and 60%, proximal RCA 50%  . Chronic diastolic CHF (congestive heart failure) ([place])   . CKD (chronic kidney disease)   . COPD (chronic obstructive pulmonary disease) ([place])    Prior heavy smoker. PFTs (3/11): FVC 87%, FEV1 73%, ratio 0.57, DLCO 75%, TLC 121%. Moderate obstructive defect. PFTs (9/15): Only minimal obstruction, sugPrior heavy smoker. PFTs (3/11): FVC 87%, FEV1 73%, ratio 0.57, DLCO 75%, TLC 121%. Moderate obstructive defect. PFTs (9/15) minimal obstruction, poss asthma component   . Depression    with bipolar tendencies  . DOE (dyspnea on exertion)    a. Myoview 8/09- EF 57%, no ischemia // b. Myoview (3/11) EF 68%, diaphragmatic attenuation, no ischemia //  c Echo (4/11) EF 58-09%, mild diastolic dysfunction, PASP 38 mmHg // d. PFTs 9/15 minimal obstruction  /  e. CT negative for ILD  . GERD (  gastroesophageal reflux disease)   . History of Doppler ultrasound    Carotid US 3/11 negative for significant stenosis  //  carotid US 6/17: Mild bilateral plaque, 1-39% ICA  . History of echocardiogram    a. Echo 12/14 Inferior and distal septal HK, mild LVH, EF 50-55%, mild MR, mild LAE  //  b. Echo 6/17: Mild focal basal septal hypertrophy, EF 60-65%, normal wall motion, grade 1 diastolic dysfunction, mild AI  . HLD (hyperlipidemia)   . Hypertensive heart disease with CHF (congestive heart failure) ([place]) 04/08/2009  . Low testosterone   . Lung nodule    a. CT in 2015 >> PET in 12/15 not sugg of malignancy   . LVH (left ventricular hypertrophy)    a. Echo 12/14 Inferior and distal septal HK, mild LVH, EF 50-55%, mild MR, mild LAE  . Mild dementia   . Mitral regurgitation    Echo (4/11) with PISA ERO 0.3 cm^2 and regurgitant volume 41 mL (moderate MR). Echo (12/14) with mild MR.   . MS (multiple sclerosis) ([place])   . MS (multiple sclerosis) ([place])   . Right bundle branch  block   . Stroke ([place])   . Thoracic aortic aneurysm (HCC)    4.1 cm 2017  . Urinary retention with incomplete bladder emptying    receives botox injections and treatment for BPH through [facility]   Past Surgical History:  Procedure Laterality Date  . APPENDECTOMY    . EP IMPLANTABLE DEVICE N/A 06/23/2015   Procedure: Loop Recorder Insertion;  Surgeon: Deboraha Sprang, MD;  Location: [facility];  Service: Cardiovascular;  Laterality: N/A;  . HEMORRHOID SURGERY    . LEFT HEART CATHETERIZATION WITH CORONARY ANGIOGRAM N/A 01/05/2013   Procedure: LEFT HEART CATHETERIZATION WITH CORONARY ANGIOGRAM;  Surgeon: Larey Dresser, MD;  Location: [facility] CATH LAB;  Service: Cardiovascular;  Laterality: N/A;  . ROTATOR CUFF REPAIR    . TEE WITHOUT CARDIOVERSION N/A 06/23/2015   Procedure: TRANSESOPHAGEAL ECHOCARDIOGRAM (TEE);  Surgeon: Fay Records, MD;  Location: [facility] ENDOSCOPY;  Service: Cardiovascular;  Laterality: N/A;  . TONSILLECTOMY    . TOTAL KNEE ARTHROPLASTY Right   . TRANSURETHRAL RESECTION OF PROSTATE     Current Outpatient Medications on File Prior to Visit  Medication Sig Dispense Refill  . acetaminophen (TYLENOL) 325 MG tablet Take 650 mg by mouth every 6 (six) hours as needed (for pain or headaches).     Marland Kitchen apixaban (ELIQUIS) 5 MG TABS tablet Take 1 tablet (5 mg total) by mouth 2 (two) times daily. 180 tablet 3  . atorvastatin (LIPITOR) 40 MG tablet Take 1 tablet (40 mg total) by mouth daily. 90 tablet 3  . bisoprolol (ZEBETA) 10 MG tablet Take 1 tablet (10 mg total) by mouth daily. 90 tablet 3  . cholecalciferol (VITAMIN D) 400 units TABS tablet Take 400 Units by mouth daily.     . Cyanocobalamin (B-12) 1000 MCG CAPS Take 1,000 mcg by mouth daily.     . CycloSPORINE (RESTASIS OP) Apply to eye.    Marland Kitchen doxazosin (CARDURA) 2 MG tablet Take 1 tablet (2 mg total) by mouth daily. 90 tablet 1  . folic acid (FOLVITE) 1 MG tablet Take 1 mg by mouth daily.    . furosemide (LASIX) 40 MG tablet TAKE 1  TABLET(40 MG) BY MOUTH DAILY 90 tablet 3  . hydrochlorothiazide (HYDRODIURIL) 25 MG tablet Take 1 tablet (25 mg total) by mouth daily. 90 tablet 0  . HYDROcodone-acetaminophen (NORCO) 10-325  MG tablet Take 1 tablet by mouth every 6 (six) hours as needed for moderate pain or severe pain. 100 tablet 0  . losartan (COZAAR) 100 MG tablet Take 1 tablet (100 mg total) by mouth daily. 90 tablet 3  . MAGNESIUM CITRATE PO Take 0.5-1 Bottles by mouth once as needed (for mild constipation).     . pantoprazole (PROTONIX) 40 MG tablet Take 1 tablet (40 mg total) by mouth daily. 90 tablet 3  . PROAIR HFA 108 (90 Base) MCG/ACT inhaler USE 1 TO 2 INHALATIONS EVERY 6 HOURS AS NEEDED FOR WHEEZING OR SHORTNESS OF BREATH 25.5 g 4  . rOPINIRole (REQUIP) 1 MG tablet TAKE 1 TABLET AT BEDTIME 90 tablet 3  . Tiotropium Bromide Monohydrate (SPIRIVA RESPIMAT) 2.5 MCG/ACT AERS Inhale 2 puffs into the lungs daily. 3 Inhaler 3  . venlafaxine XR (EFFEXOR-XR) 150 MG 24 hr capsule TAKE 1 CAPSULE TWICE A DAY 180 capsule 3   No current facility-administered medications on file prior to visit.    Allergies  Allergen Reactions  . Acetylcholine     Patient was suspected of having "Brugada Syndrome": (BrS) is a genetic condition that results in abnormal electrical activity within the heart, increasing the risk of sudden cardiac death. Those affected may have episodes of passing out.  Marland Kitchen Alcohol-Sulfur [Sulfur] Other (See Comments)    Patient was suspected of having "Brugada Syndrome"  . Amitriptyline Other (See Comments)    Patient was suspected of having "Brugada Syndrome"  . Bupivacaine Other (See Comments)    Patient was suspected of having "Brugada Syndrome"  . Clomipramine Hcl Other (See Comments)    Patient was suspected of having "Brugada Syndrome"  . Cocaine Other (See Comments)    Patient was suspected of having "Brugada Syndrome"  . Desipramine Other (See Comments)    Patient was suspected of having "Brugada Syndrome"   . Ergonovine Other (See Comments)    Patient was suspected of having "Brugada Syndrome"  . Flecainide Other (See Comments)    Patient was suspected of having "Brugada Syndrome"  . Lithium Other (See Comments)    Patient was suspected of having "Brugada Syndrome"  . Loxapine Other (See Comments)    Patient was suspected of having "Brugada Syndrome"  . Nortriptyline Other (See Comments)    Patient was suspected of having "Brugada Syndrome"  . Oxcarbazepine Other (See Comments)    Patient was suspected of having "Brugada Syndrome"  . Procainamide Other (See Comments)    Patient was suspected of having "Brugada Syndrome"  . Procaine Other (See Comments)    Patient was suspected of having "Brugada Syndrome"  . Propafenone Other (See Comments)    Patient was suspected of having "Brugada Syndrome"  . Propofol Other (See Comments)    Patient was suspected of having "Brugada Syndrome"  . Trifluoperazine Other (See Comments)    Patient was suspected of having "Brugada Syndrome"   Social History   Socioeconomic History  . Marital status: Married    Spouse name: Not on file  . Number of children: Not on file  . Years of education: Not on file  . Highest education level: Not on file  Social Needs  . Financial resource strain: Not on file  . Food insecurity - worry: Not on file  . Food insecurity - inability: Not on file  . Transportation needs - medical: Not on file  . Transportation needs - non-medical: Not on file  Occupational History  . Occupation: retired    Fish farm manager:  RETIRED  Tobacco Use  . Smoking status: Former Smoker    Packs/day: 2.00    Years: 40.00    Pack years: 80.00    Types: Cigarettes    Last attempt to quit: 01/04/1993    Years since quitting: 23.9  . Smokeless tobacco: Former Systems developer    Types: Chew  Substance and Sexual Activity  . Alcohol use: Yes    Alcohol/week: 15.0 oz    Types: 25 Cans of beer per week    Comment: 12 pack a week  . Drug use: No  .  Sexual activity: Not on file  Other Topics Concern  . Not on file  Social History Narrative   Retired from the [employer] after 20 years   [place] Veteran      Review of Systems  All other systems reviewed and are negative.      Objective:   Physical Exam  Constitutional: He is oriented to person, place, and time. He appears well-developed and well-nourished. No distress.  Cardiovascular: Normal rate, regular rhythm, normal heart sounds and intact distal pulses.  No murmur heard. Pulmonary/Chest: Effort normal and breath sounds normal. No respiratory distress. He has no wheezes. He has no rales.  Neurological: He is alert and oriented to person, place, and time. He has normal reflexes. No cranial nerve deficit. He exhibits normal muscle tone. Coordination normal.  Skin: He is not diaphoretic.  Psychiatric: He has a normal mood and affect. His behavior is normal. Judgment and thought content normal.  Vitals reviewed.         Assessment & Plan:  New daily persistent headache  Hypersomnolence  Benign essential HTN  Rather than start the patient on a new daily medication to prevent headaches, I believe we need to focus on the source of the headaches rather than simply masking them.  I am very suspicious that the patient may have obstructive sleep apnea.  I believe the sleep study will provide significant information that may help in the management of his chronic daily headache as well as help manage his blood pressure.  In the meantime, I have asked the patient's wife to check his blood pressure several times throughout the day and provide those records to me in a few days so that I can see how his blood pressure is fluctuating.  If in fact there are while fluctuations in his blood pressure, we can attempt to better manage his blood pressure however today his blood pressure in the office is actually well controlled.  I will also schedule the patient for an overnight pulse oximetry  reading to determine if there is significant hypoxia that may suggest underlying sleep apnea as a potential cause of his chronic headache

## 2016-12-08 ENCOUNTER — Other Ambulatory Visit: Payer: Self-pay | Admitting: Family Medicine

## 2016-12-08 DIAGNOSIS — G4452 New daily persistent headache (NDPH): Secondary | ICD-10-CM

## 2016-12-08 DIAGNOSIS — G4710 Hypersomnia, unspecified: Secondary | ICD-10-CM

## 2016-12-09 ENCOUNTER — Telehealth: Payer: Self-pay

## 2016-12-09 LAB — CUP PACEART REMOTE DEVICE CHECK
MDC IDC PG IMPLANT DT: 20170619
MDC IDC SESS DTM: 20181117144338

## 2016-12-09 NOTE — Telephone Encounter (Signed)
Patient's wife is calling wanting to see if there would be any way to move up his sleep consult with Dr. Rexene Alberts. They are pretty sure he has sleep apnea, he's already had 3 strokes, she is just worried about him. I made her aware that we did not have anything at the moment but we would call them if anything sooner became available.

## 2016-12-15 NOTE — Telephone Encounter (Signed)
I called pt. I spoke to pt's wife, she is agreeable to pt coming in tomorrow 12/16/16 at 11:30am for an appt with Dr. Rexene Alberts. Pt's wife verbalized understanding of new appt date and time.

## 2016-12-16 ENCOUNTER — Encounter: Payer: Self-pay | Admitting: Neurology

## 2016-12-16 ENCOUNTER — Ambulatory Visit (INDEPENDENT_AMBULATORY_CARE_PROVIDER_SITE_OTHER): Payer: Medicare Other | Admitting: Neurology

## 2016-12-16 ENCOUNTER — Ambulatory Visit (INDEPENDENT_AMBULATORY_CARE_PROVIDER_SITE_OTHER): Payer: Medicare Other | Admitting: Internal Medicine

## 2016-12-16 ENCOUNTER — Encounter: Payer: Self-pay | Admitting: Internal Medicine

## 2016-12-16 VITALS — BP 143/77 | HR 106 | Ht 72.0 in | Wt 231.0 lb

## 2016-12-16 VITALS — BP 150/68 | HR 100 | Ht 72.0 in | Wt 233.5 lb

## 2016-12-16 DIAGNOSIS — R51 Headache: Secondary | ICD-10-CM

## 2016-12-16 DIAGNOSIS — E785 Hyperlipidemia, unspecified: Secondary | ICD-10-CM | POA: Diagnosis not present

## 2016-12-16 DIAGNOSIS — F1190 Opioid use, unspecified, uncomplicated: Secondary | ICD-10-CM

## 2016-12-16 DIAGNOSIS — Z8673 Personal history of transient ischemic attack (TIA), and cerebral infarction without residual deficits: Secondary | ICD-10-CM | POA: Diagnosis not present

## 2016-12-16 DIAGNOSIS — R351 Nocturia: Secondary | ICD-10-CM | POA: Diagnosis not present

## 2016-12-16 DIAGNOSIS — I5032 Chronic diastolic (congestive) heart failure: Secondary | ICD-10-CM | POA: Diagnosis not present

## 2016-12-16 DIAGNOSIS — G4734 Idiopathic sleep related nonobstructive alveolar hypoventilation: Secondary | ICD-10-CM | POA: Diagnosis not present

## 2016-12-16 DIAGNOSIS — G4719 Other hypersomnia: Secondary | ICD-10-CM | POA: Diagnosis not present

## 2016-12-16 DIAGNOSIS — R0602 Shortness of breath: Secondary | ICD-10-CM

## 2016-12-16 DIAGNOSIS — R0683 Snoring: Secondary | ICD-10-CM | POA: Diagnosis not present

## 2016-12-16 DIAGNOSIS — I639 Cerebral infarction, unspecified: Secondary | ICD-10-CM | POA: Diagnosis not present

## 2016-12-16 DIAGNOSIS — I712 Thoracic aortic aneurysm, without rupture, unspecified: Secondary | ICD-10-CM

## 2016-12-16 DIAGNOSIS — F1010 Alcohol abuse, uncomplicated: Secondary | ICD-10-CM

## 2016-12-16 DIAGNOSIS — I10 Essential (primary) hypertension: Secondary | ICD-10-CM | POA: Diagnosis not present

## 2016-12-16 DIAGNOSIS — E669 Obesity, unspecified: Secondary | ICD-10-CM

## 2016-12-16 DIAGNOSIS — R519 Headache, unspecified: Secondary | ICD-10-CM

## 2016-12-16 MED ORDER — NEBIVOLOL HCL 5 MG PO TABS: 5.0000 mg | ORAL_TABLET | Freq: Every day | ORAL | 1 refills | Status: DC

## 2016-12-16 MED ORDER — FUROSEMIDE 40 MG PO TABS: ORAL_TABLET | ORAL | 1 refills | Status: DC

## 2016-12-16 NOTE — Patient Instructions (Addendum)
Thank you for choosing [facility] for your sleep related care! It was nice to meet you today! I appreciate that you entrust me with your sleep related healthcare concerns. I hope, I was able to address at least some of your concerns today, and that I can help you feel reassured and also get better.    Here is what we discussed today and what we came up with as our plan for you:    Based on your symptoms and your exam I believe you are at risk for obstructive sleep apnea or OSA, and I think we should proceed with a sleep study to determine whether you do or do not have OSA and how severe it is. If you have more than mild OSA, I want you to consider treatment with CPAP. Please remember, the risks and ramifications of moderate to severe obstructive sleep apnea or OSA are: Cardiovascular disease, including congestive heart failure, stroke, difficult to control hypertension, arrhythmias, and even type 2 diabetes has been linked to untreated OSA. Sleep apnea causes disruption of sleep and sleep deprivation in most cases, which, in turn, can cause recurrent headaches, problems with memory, mood, concentration, focus, and vigilance. Most people with untreated sleep apnea report excessive daytime sleepiness, which can affect their ability to drive. Please do not drive if you feel sleepy.   I will likely see you back after your sleep study to go over the test results and where to go from there. We will call you after your sleep study to advise about the results (most likely, you will hear from [place], my nurse) and to set up an appointment at the time, as necessary.    Our sleep lab administrative assistant will call you to schedule your sleep study. If you don't hear back from her by about 2 weeks from now, please feel free to call her at [phone]. You can leave a message with your phone number and concerns, if you get the voicemail box. She will call back as soon as possible.   Please  reduce your alcohol intake, as it may interact with several of your medications.

## 2016-12-16 NOTE — Progress Notes (Signed)
Subjective:    Patient ID: [name] [name] is a 76 y.o. male.  HPI     [name] Age, MD, PhD [facility] [address]O. [place], [place] [address]  Dear [name] [name],   I saw your patient, [name] [name], upon your kind request in my neurologic clinic today for initial consultation of his sleep disorder, in particular, concern for underlying obstructive sleep apnea. The patient is accompanied by his wife today. As you know, [name] [name] is a 76 year old right-handed gentleman with an underlying complex medical history of right MCA stroke in June 2017, thoracic aortic aneurysm, coronary artery disease, diastolic congestive heart failure, hypertension, hyperlipidemia, reflux disease, urethral cancer, recurrent UTI, depression, memory loss and obesity, who reports snoring and excessive daytime somnolence. I reviewed your office note from 12/01/2016. His Epworth sleepiness score is 4 out of 24 today, fatigue score is 44 out of 63. Of note, he had a overnight pulse oximetry test through pulmonology on 05/20/2016 and I reviewed the results: Average oxygen saturation was 89%, nadir was 68%. Time below or at 88% saturation was 5 hours and 3 minutes. Total valid test time was 13 hours and 2 minutes.  He was supposed to be on O2 at night, but did not use it, has now started seeing Dr. Melvyn Novas, another appt. pending. He recently quit smoking in the summer. He drinks about 5 beers per day. Wife reports, she decided not to worry about it and that his PCP is aware. He takes Requip 1 mg qHS for RLS. He takes Norco, has taken up to 3 pills together. He is evasive about how he takes it and how many, maybe 3 times a week, 1 pill every 2-3 hours, as I understand.  He admits that he does not drink enough water. He drinks soda, about 3 per day and one cup of coffee. He has never had a sleep study. His wife indicates that he takes multiple naps during the day. Bed time is around 9 PM.  She reports, he goes back to bed after breakfast, and after lunch and one more nap maybe before bedtime. This highly underestimates his ESS. He has gained weight after he was started on antidepressant medication. He has to self catheterize at least 3 times a day. He has nocturia multiple times a night, maybe 5 times per night on average. He has had frequent morning headaches in the past month or so.  His Past Medical History Is Significant For: Past Medical History:  Diagnosis Date  . Arthritis    s/p TKR  . Bladder cancer (HCC)    [facility], [facility] low grade papillary urothileal carcinoma  . BPH (benign prostatic hyperplasia)   . Brugada syndrome    Possible Type II Brugada ECG pattern. No family history of SCD, no syncope, no tachypalpitations.  Marland Kitchen CAD (coronary artery disease)    a. LHC 1/15 - mid LCx 50 and 60%, proximal RCA 50%  . Chronic diastolic CHF (congestive heart failure) ([place])   . CKD (chronic kidney disease)   . COPD (chronic obstructive pulmonary disease) ([place])    Prior heavy smoker. PFTs (3/11): FVC 87%, FEV1 73%, ratio 0.57, DLCO 75%, TLC 121%. Moderate obstructive defect. PFTs (9/15): Only minimal obstruction, sugPrior heavy smoker. PFTs (3/11): FVC 87%, FEV1 73%, ratio 0.57, DLCO 75%, TLC 121%. Moderate obstructive defect. PFTs (9/15) minimal obstruction, poss asthma component   . Depression    with bipolar tendencies  . DOE (dyspnea on exertion)  a. Myoview 8/09- EF 57%, no ischemia // b. Myoview (3/11) EF 68%, diaphragmatic attenuation, no ischemia //  c Echo (4/11) EF 64-40%, mild diastolic dysfunction, PASP 38 mmHg // d. PFTs 9/15 minimal obstruction  /  e. CT negative for ILD  . GERD (gastroesophageal reflux disease)   . History of Doppler ultrasound    Carotid US 3/11 negative for significant stenosis  //  carotid US 6/17: Mild bilateral plaque, 1-39% ICA  . History of echocardiogram    a. Echo 12/14 Inferior and distal septal HK, mild LVH, EF 50-55%, mild MR, mild LAE  //   b. Echo 6/17: Mild focal basal septal hypertrophy, EF 60-65%, normal wall motion, grade 1 diastolic dysfunction, mild AI  . HLD (hyperlipidemia)   . Hypertensive heart disease with CHF (congestive heart failure) ([place]) 04/08/2009  . Low testosterone   . Lung nodule    a. CT in 2015 >> PET in 12/15 not sugg of malignancy   . LVH (left ventricular hypertrophy)    a. Echo 12/14 Inferior and distal septal HK, mild LVH, EF 50-55%, mild MR, mild LAE  . Mild dementia   . Mitral regurgitation    Echo (4/11) with PISA ERO 0.3 cm^2 and regurgitant volume 41 mL (moderate MR). Echo (12/14) with mild MR.   . MS (multiple sclerosis) ([place])   . MS (multiple sclerosis) ([place])   . Right bundle branch block   . Stroke ([place])   . Thoracic aortic aneurysm (HCC)    4.1 cm 2017  . Urinary retention with incomplete bladder emptying    receives botox injections and treatment for BPH through [facility]    Her Past Surgical History Is Significant For: Past Surgical History:  Procedure Laterality Date  . APPENDECTOMY    . EP IMPLANTABLE DEVICE N/A 06/23/2015   Procedure: Loop Recorder Insertion;  Surgeon: Deboraha Sprang, MD;  Location: [facility];  Service: Cardiovascular;  Laterality: N/A;  . HEMORRHOID SURGERY    . LEFT HEART CATHETERIZATION WITH CORONARY ANGIOGRAM N/A 01/05/2013   Procedure: LEFT HEART CATHETERIZATION WITH CORONARY ANGIOGRAM;  Surgeon: Larey Dresser, MD;  Location: [facility] CATH LAB;  Service: Cardiovascular;  Laterality: N/A;  . ROTATOR CUFF REPAIR    . TEE WITHOUT CARDIOVERSION N/A 06/23/2015   Procedure: TRANSESOPHAGEAL ECHOCARDIOGRAM (TEE);  Surgeon: Fay Records, MD;  Location: [facility] ENDOSCOPY;  Service: Cardiovascular;  Laterality: N/A;  . TONSILLECTOMY    . TOTAL KNEE ARTHROPLASTY Right   . TRANSURETHRAL RESECTION OF PROSTATE      His Family History Is Significant For: Family History  Problem Relation [name] of Onset  . Stroke Father   . Stroke Mother   . Hypertension Sister   .  Kidney cancer Brother   . Arthritis Brother   . Other Brother        knee problems, Bil TKR  . Hypertension Unknown        family history  . Prostate cancer Neg Hx   . Bladder Cancer Neg Hx     His Social History Is Significant For: Social History   Socioeconomic History  . Marital status: Married    Spouse name: None  . Number of children: None  . Years of education: None  . Highest education level: None  Social Needs  . Financial resource strain: None  . Food insecurity - worry: None  . Food insecurity - inability: None  . Transportation needs - medical: None  . Transportation needs - non-medical: None  Occupational History  . Occupation: retired    Fish farm manager: RETIRED  Tobacco Use  . Smoking status: Former Smoker    Packs/day: 2.00    Years: 40.00    Pack years: 80.00    Types: Cigarettes    Last attempt to quit: 01/04/1993    Years since quitting: 23.9  . Smokeless tobacco: Former Systems developer    Types: Chew  Substance and Sexual Activity  . Alcohol use: Yes    Alcohol/week: 15.0 oz    Types: 25 Cans of beer per week    Comment: 12 pack a week  . Drug use: No  . Sexual activity: None  Other Topics Concern  . None  Social History Narrative   Retired from the [employer] after 20 years   [place] Veteran    His Allergies Are:  Allergies  Allergen Reactions  . Acetylcholine     Patient was suspected of having "Brugada Syndrome": (BrS) is a genetic condition that results in abnormal electrical activity within the heart, increasing the risk of sudden cardiac death. Those affected may have episodes of passing out.  Marland Kitchen Alcohol-Sulfur [Sulfur] Other (See Comments)    Patient was suspected of having "Brugada Syndrome"  . Amitriptyline Other (See Comments)    Patient was suspected of having "Brugada Syndrome"  . Bupivacaine Other (See Comments)    Patient was suspected of having "Brugada Syndrome"  . Clomipramine Hcl Other (See Comments)    Patient was suspected of having  "Brugada Syndrome"  . Cocaine Other (See Comments)    Patient was suspected of having "Brugada Syndrome"  . Desipramine Other (See Comments)    Patient was suspected of having "Brugada Syndrome"  . Ergonovine Other (See Comments)    Patient was suspected of having "Brugada Syndrome"  . Flecainide Other (See Comments)    Patient was suspected of having "Brugada Syndrome"  . Lithium Other (See Comments)    Patient was suspected of having "Brugada Syndrome"  . Loxapine Other (See Comments)    Patient was suspected of having "Brugada Syndrome"  . Nortriptyline Other (See Comments)    Patient was suspected of having "Brugada Syndrome"  . Oxcarbazepine Other (See Comments)    Patient was suspected of having "Brugada Syndrome"  . Procainamide Other (See Comments)    Patient was suspected of having "Brugada Syndrome"  . Procaine Other (See Comments)    Patient was suspected of having "Brugada Syndrome"  . Propafenone Other (See Comments)    Patient was suspected of having "Brugada Syndrome"  . Propofol Other (See Comments)    Patient was suspected of having "Brugada Syndrome"  . Trifluoperazine Other (See Comments)    Patient was suspected of having "Brugada Syndrome"  :   His Current Medications Are:  Outpatient Encounter Medications as of 12/16/2016  Medication Sig  . acetaminophen (TYLENOL) 325 MG tablet Take 650 mg by mouth every 6 (six) hours as needed (for pain or headaches).   Marland Kitchen apixaban (ELIQUIS) 5 MG TABS tablet Take 1 tablet (5 mg total) by mouth 2 (two) times daily.  . cholecalciferol (VITAMIN D) 400 units TABS tablet Take 400 Units by mouth daily.   . Cyanocobalamin (B-12) 1000 MCG CAPS Take 1,000 mcg by mouth daily.   . CycloSPORINE (RESTASIS OP) Apply to eye.  Marland Kitchen doxazosin (CARDURA) 2 MG tablet Take 1 tablet (2 mg total) by mouth daily.  . folic acid (FOLVITE) 1 MG tablet Take 1 mg by mouth daily.  . hydrochlorothiazide (HYDRODIURIL) 25 MG  tablet Take 1 tablet (25 mg  total) by mouth daily.  Marland Kitchen HYDROcodone-acetaminophen (NORCO) 10-325 MG tablet Take 1 tablet by mouth every 6 (six) hours as needed for moderate pain or severe pain.  Marland Kitchen MAGNESIUM CITRATE PO Take 0.5-1 Bottles by mouth once as needed (for mild constipation).   . pantoprazole (PROTONIX) 40 MG tablet Take 1 tablet (40 mg total) by mouth daily.  Marland Kitchen PROAIR HFA 108 (90 Base) MCG/ACT inhaler USE 1 TO 2 INHALATIONS EVERY 6 HOURS AS NEEDED FOR WHEEZING OR SHORTNESS OF BREATH  . rOPINIRole (REQUIP) 1 MG tablet TAKE 1 TABLET AT BEDTIME  . Tiotropium Bromide Monohydrate (SPIRIVA RESPIMAT) 2.5 MCG/ACT AERS Inhale 2 puffs into the lungs daily.  Marland Kitchen venlafaxine XR (EFFEXOR-XR) 150 MG 24 hr capsule TAKE 1 CAPSULE TWICE A DAY  . atorvastatin (LIPITOR) 40 MG tablet Take 1 tablet (40 mg total) by mouth daily.  . bisoprolol (ZEBETA) 10 MG tablet Take 1 tablet (10 mg total) by mouth daily. (Patient not taking: Reported on 12/07/2016)  . furosemide (LASIX) 40 MG tablet TAKE 1 TABLET(40 MG) BY MOUTH DAILY (Patient not taking: Reported on 12/07/2016)  . losartan (COZAAR) 100 MG tablet Take 1 tablet (100 mg total) by mouth daily.   No facility-administered encounter medications on file as of 12/16/2016.   :  Review of Systems:  Out of a complete 14 point review of systems, all are reviewed and negative with the exception of these symptoms as listed below: Review of Systems  Neurological:       Pt presents today to discuss his sleep. Pt has never had a sleep study but does endorse snoring.  Epworth Sleepiness Scale 0= would never doze 1= slight chance of dozing 2= moderate chance of dozing 3= high chance of dozing  Sitting and reading: 0 Watching TV: 1 Sitting inactive in a public place (ex. Theater or meeting): 0 As a passenger in a car for an hour without a break: 0 Lying down to rest in the afternoon: 3 Sitting and talking to someone: 0 Sitting quietly after lunch (no alcohol): 0 In a car, while stopped in  traffic: 0 Total: 4     Objective:  Neurological Exam  Physical Exam Physical Examination:   Vitals:   12/16/16 1146  BP: (!) 143/77  Pulse: (!) 106   General Examination: The patient is a very pleasant 76 y.o. male in no acute distress. He appears well-developed and well-nourished and well groomed.   HEENT: Normocephalic, atraumatic, pupils are equal, round and reactive to light and accommodation. Extraocular tracking is good without limitation to gaze excursion or nystagmus noted. Normal smooth pursuit is noted. Hearing is quite impaired, despite bilateral hearing aids in place. Face is symmetric with normal facial animation and normal facial sensation. Speech is dysarthric. There is no lip, neck/head, jaw or voice tremor. Oropharynx exam reveals: moderate mouth dryness, adequate dental hygiene and moderate airway crowding, due to  redundant soft palate, tip of uvula not fully visualized, tonsils are absent. Mallampati is class III. Neck circumference is 18-5/8 inches.  Chest: Clear to auscultation without wheezing, rhonchi or crackles noted.  Heart: S1+S2+0, regular and normal without murmurs, rubs or gallops noted.   Abdomen: Soft, non-tender and non-distended with normal bowel sounds appreciated on auscultation.  Extremities: There is trace pitting edema in the distal lower extremities bilaterally. Pedal pulses are intact.  Skin: Warm and dry without trophic changes noted.  Musculoskeletal: exam reveals no obvious joint deformities, tenderness or joint swelling  or erythema.   Neurologically:  Mental status: The patient is awake, alert and oriented in all 4 spheres. His immediate and remote memory, attention, language skills and fund of knowledge are fairly appropriate. There is no evidence of aphasia, agnosia, apraxia or anomia. Speech is clear with normal prosody and enunciation. Thought process is linear. Mood is constricted and affect is blunted.  Cranial nerves II - XII are  as described above under HEENT exam. In addition: shoulder shrug is normal with equal shoulder height noted. Motor exam: Normal bulk, strength and tone is noted. There is no drift, tremor or rebound. Reflexes are 1+ in the upper extremities and diminished in the lower extremities.  Cerebellar testing: No dysmetria or intention tremor. There is no truncal or gait ataxia.  Sensory exam: intact to light touch in the upper and lower extremities.  Gait, station and balance: He stands without difficulty. No veering to one side is noted. No leaning to one side is noted. Posture is [name]-appropriate and stance is narrow based. Gait shows normal stride length and normal pace. No problems turning are noted.  Assessment and Plan:   In summary, SAMYAK SACKMANN is a very pleasant 76 y.o.-year old male with an underlying complex medical history of right MCA stroke in June 2017, thoracic aortic aneurysm, coronary artery disease, diastolic congestive heart failure, hypertension, hyperlipidemia, reflux disease, urethral cancer, recurrent UTI, depression, memory loss and obesity, whose history and physical exam are somewhat concerning for obstructive sleep apnea, but he also has a history of chronic lung disease, nocturnal oxygen desaturations and requirement for supplemental oxygen, which he has not been using. In addition, he is at risk for severe desaturations because of narcotic pain medication use. He is cautioned about this. He is furthermore strongly advised never to take hydrocodone 3 pills at one time or every 2-3 hours which can be dangerous. In addition, he is strongly advised to reduce his oral intake as several of his medications may not combine well with alcohol.  I had a long chat with the patient and his wife about my findings and the diagnosis of OSA, its prognosis and treatment options. We talked about medical treatments, surgical interventions and non-pharmacological approaches. I explained in particular  the risks and ramifications of untreated moderate to severe OSA, especially with respect to developing cardiovascular disease down the Road, including congestive heart failure, difficult to treat hypertension, cardiac arrhythmias, or stroke. Even type 2 diabetes has, in part, been linked to untreated OSA. Symptoms of untreated OSA include daytime sleepiness, memory problems, mood irritability and mood disorder such as depression and anxiety, lack of energy, as well as recurrent headaches, especially morning headaches. We talked about trying to maintain a healthy lifestyle in general, as well as the importance of weight control. I encouraged the patient to eat healthy, exercise daily and keep well hydrated, to keep a scheduled bedtime and wake time routine, to not skip any meals and eat healthy snacks in between meals. I advised the patient not to drive when feeling sleepy. I recommended the following at this time: sleep study with potential positive airway pressure titration. (We will score hypopneas at 4%).   I explained the sleep test procedure to the patient and also outlined possible surgical and non-surgical treatment options of OSA, including the use of a custom-made dental device (which would require a referral to a specialist dentist or oral surgeon), upper airway surgical options, such as pillar implants, radiofrequency surgery, tongue base surgery, and UPPP (which  would involve a referral to an ENT surgeon). Rarely, jaw surgery such as mandibular advancement may be considered.  I also explained the CPAP treatment option to the patient, who indicated that he would be willing to try CPAP if the need arises. I explained the importance of being compliant with PAP treatment, not only for insurance purposes but primarily to improve His symptoms, and for the patient's long term health benefit, including to reduce His cardiovascular risks. I answered all their questions today and the patient and his wife were  in agreement. I will likely see him back after the sleep study is completed and encouraged them to call with any interim questions, concerns, problems or updates.   Thank you very much for allowing me to participate in the care of this nice patient. If I can be of any further assistance to you please do not hesitate to talk to me.   Sincerely,   [name] Age, MD, PhD

## 2016-12-16 NOTE — Progress Notes (Signed)
Follow-up Outpatient Visit Date: 12/16/2016  Primary Care Provider: [name], MD [address] [place] [address]  Chief Complaint: Shortness of breath  HPI:  Mr. Gupton is a 76 y.o. year-old male with mod non-obstructive CAD on LHC in 4/27, diastolic HF, HTN, HL, GERD, COPD, multiple prior strokes, multiple sclerosis, AKI assoc with NSAID use. He has struggled with bipolar depression. I last saw Mr. Smyers in May, at which time he noted stable exertional dyspnea. He was undergoing pulmonary evaluation by Dr. Chase Caller at the time; we therefore deferred additional testing and medication changes.  Mr. Kainz was admitted in mid June with recurrent stroke. Interrogation of his loop recorded did not show any evidence of atrial fibrillation. However, given recurrent strokes in multiple territories despite antiplatelet therapy, Mr. Trentman was started on apixaban by his neurologist, Dr. Erlinda Hong. He has not had any new neurologic symptoms to suggest further strokes.  Since our last visit, Mr. Hoglund has switched his pulmonary care to Dr. Melvyn Novas. He was transitioned to Spiriva Respimat and has noticed a mild improvement in his chronic dyspnea. He denies chest pain, palpitations, and lightheadedness. Mr. Kandel is currently off bisoprolol, as the medication is on backorder. Due to elevated blood pressure, Dr. Dennard Schaumann (PCP) discontinued furosemide and started HCTZ 25 mg daily. Since this medication change was made, Mr. Lagace has noted some progressive leg swelling. His weight is also up 23 pounds since 07/2016.  --------------------------------------------------------------------------------------------------  Past Medical History:  Diagnosis Date  . Arthritis    s/p TKR  . Bladder cancer (HCC)    [facility] [place] low grade papillary urothileal carcinoma  . BPH (benign prostatic hyperplasia)   . Brugada syndrome    Possible Type II Brugada ECG pattern. No family history of SCD, no  syncope, no tachypalpitations.  Marland Kitchen CAD (coronary artery disease)    a. LHC 1/15 - mid LCx 50 and 60%, proximal RCA 50%  . Chronic diastolic CHF (congestive heart failure) ([place])   . CKD (chronic kidney disease)   . COPD (chronic obstructive pulmonary disease) ([place])    Prior heavy smoker. PFTs (3/11): FVC 87%, FEV1 73%, ratio 0.57, DLCO 75%, TLC 121%. Moderate obstructive defect. PFTs (9/15): Only minimal obstruction, sugPrior heavy smoker. PFTs (3/11): FVC 87%, FEV1 73%, ratio 0.57, DLCO 75%, TLC 121%. Moderate obstructive defect. PFTs (9/15) minimal obstruction, poss asthma component   . Depression    with bipolar tendencies  . DOE (dyspnea on exertion)    a. Myoview 8/09- EF 57%, no ischemia // b. Myoview (3/11) EF 68%, diaphragmatic attenuation, no ischemia //  c Echo (4/11) EF 06-23%, mild diastolic dysfunction, PASP 38 mmHg // d. PFTs 9/15 minimal obstruction  /  e. CT negative for ILD  . GERD (gastroesophageal reflux disease)   . History of Doppler ultrasound    Carotid US 3/11 negative for significant stenosis  //  carotid US 6/17: Mild bilateral plaque, 1-39% ICA  . History of echocardiogram    a. Echo 12/14 Inferior and distal septal HK, mild LVH, EF 50-55%, mild MR, mild LAE  //  b. Echo 6/17: Mild focal basal septal hypertrophy, EF 60-65%, normal wall motion, grade 1 diastolic dysfunction, mild AI  . HLD (hyperlipidemia)   . Hypertensive heart disease with CHF (congestive heart failure) ([place]) 04/08/2009  . Low testosterone   . Lung nodule    a. CT in 2015 >> PET in 12/15 not sugg of malignancy   . LVH (left ventricular hypertrophy)  a. Echo 12/14 Inferior and distal septal HK, mild LVH, EF 50-55%, mild MR, mild LAE  . Mild dementia   . Mitral regurgitation    Echo (4/11) with PISA ERO 0.3 cm^2 and regurgitant volume 41 mL (moderate MR). Echo (12/14) with mild MR.   . MS (multiple sclerosis) ([place])   . MS (multiple sclerosis) ([place])   . Right bundle branch block   . Stroke  ([place])   . Thoracic aortic aneurysm (HCC)    4.1 cm 2017  . Urinary retention with incomplete bladder emptying    receives botox injections and treatment for BPH through [facility]   Past Surgical History:  Procedure Laterality Date  . APPENDECTOMY    . EP IMPLANTABLE DEVICE N/A 06/23/2015   Procedure: Loop Recorder Insertion;  Surgeon: Deboraha Sprang, MD;  Location: [facility];  Service: Cardiovascular;  Laterality: N/A;  . HEMORRHOID SURGERY    . LEFT HEART CATHETERIZATION WITH CORONARY ANGIOGRAM N/A 01/05/2013   Procedure: LEFT HEART CATHETERIZATION WITH CORONARY ANGIOGRAM;  Surgeon: Larey Dresser, MD;  Location: [facility] CATH LAB;  Service: Cardiovascular;  Laterality: N/A;  . ROTATOR CUFF REPAIR    . TEE WITHOUT CARDIOVERSION N/A 06/23/2015   Procedure: TRANSESOPHAGEAL ECHOCARDIOGRAM (TEE);  Surgeon: Fay Records, MD;  Location: [facility] ENDOSCOPY;  Service: Cardiovascular;  Laterality: N/A;  . TONSILLECTOMY    . TOTAL KNEE ARTHROPLASTY Right   . TRANSURETHRAL RESECTION OF PROSTATE      Current Meds  Medication Sig  . acetaminophen (TYLENOL) 325 MG tablet Take 650 mg by mouth every 6 (six) hours as needed (for pain or headaches).   Marland Kitchen apixaban (ELIQUIS) 5 MG TABS tablet Take 1 tablet (5 mg total) by mouth 2 (two) times daily.  Marland Kitchen atorvastatin (LIPITOR) 40 MG tablet Take 40 mg by mouth daily.  . cholecalciferol (VITAMIN D) 400 units TABS tablet Take 400 Units by mouth daily.   . Cyanocobalamin (B-12) 1000 MCG CAPS Take 1,000 mcg by mouth daily.   . CycloSPORINE (RESTASIS OP) Apply to eye.  Marland Kitchen doxazosin (CARDURA) 2 MG tablet Take 1 tablet (2 mg total) by mouth daily.  . folic acid (FOLVITE) 1 MG tablet Take 1 mg by mouth daily.  Marland Kitchen HYDROcodone-acetaminophen (NORCO) 10-325 MG tablet Take 1 tablet by mouth every 6 (six) hours as needed for moderate pain or severe pain.  Marland Kitchen MAGNESIUM CITRATE PO Take 0.5-1 Bottles by mouth once as needed (for mild constipation).   . pantoprazole (PROTONIX) 40 MG  tablet Take 1 tablet (40 mg total) by mouth daily.  Marland Kitchen PROAIR HFA 108 (90 Base) MCG/ACT inhaler USE 1 TO 2 INHALATIONS EVERY 6 HOURS AS NEEDED FOR WHEEZING OR SHORTNESS OF BREATH  . rOPINIRole (REQUIP) 1 MG tablet TAKE 1 TABLET AT BEDTIME  . Tiotropium Bromide Monohydrate (SPIRIVA RESPIMAT) 2.5 MCG/ACT AERS Inhale 2 puffs into the lungs daily.  Marland Kitchen venlafaxine XR (EFFEXOR-XR) 150 MG 24 hr capsule TAKE 1 CAPSULE TWICE A DAY  . [DISCONTINUED] hydrochlorothiazide (HYDRODIURIL) 25 MG tablet Take 1 tablet (25 mg total) by mouth daily.    Allergies: Acetylcholine; Alcohol-sulfur [sulfur]; Amitriptyline; Bupivacaine; Clomipramine hcl; Cocaine; Desipramine; Ergonovine; Flecainide; Lithium; Loxapine; Nortriptyline; Oxcarbazepine; Procainamide; Procaine; Propafenone; Propofol; and Trifluoperazine  Social History   Socioeconomic History  . Marital status: Married    Spouse name: Not on file  . Number of children: Not on file  . Years of education: Not on file  . Highest education level: Not on file  Social Needs  .  Financial resource strain: Not on file  . Food insecurity - worry: Not on file  . Food insecurity - inability: Not on file  . Transportation needs - medical: Not on file  . Transportation needs - non-medical: Not on file  Occupational History  . Occupation: retired    Fish farm manager: RETIRED  Tobacco Use  . Smoking status: Former Smoker    Packs/day: 2.00    Years: 40.00    Pack years: 80.00    Types: Cigarettes    Last attempt to quit: 01/04/1993    Years since quitting: 23.9  . Smokeless tobacco: Former Systems developer    Types: Chew  Substance and Sexual Activity  . Alcohol use: Yes    Alcohol/week: 15.0 oz    Types: 25 Cans of beer per week    Comment: 12 pack a week  . Drug use: No  . Sexual activity: Not on file  Other Topics Concern  . Not on file  Social History Narrative   Retired from the [employer] after 20 years   [place] Veteran    Family History  Problem Relation Age of  Onset  . Stroke Father   . Stroke Mother   . Hypertension Sister   . Kidney cancer Brother   . Arthritis Brother   . Other Brother        knee problems, Bil TKR  . Hypertension Unknown        family history  . Prostate cancer Neg Hx   . Bladder Cancer Neg Hx     Review of Systems: A 12-system review of systems was performed and was negative except as noted in the HPI.  --------------------------------------------------------------------------------------------------  Physical Exam: BP (!) 150/68   Pulse 100   Ht 6' (1.829 m)   Wt 233 lb 8 oz (105.9 kg)   SpO2 93%   BMI 31.67 kg/m   General:  Obese man, seated comfortably in the exam room. He is accompanied by his wife. HEENT: No conjunctival pallor or scleral icterus. Moist mucous membranes.  OP clear. Neck: Supple without lymphadenopathy, thyromegaly, JVD, or HJR. Lungs: Normal work of breathing. Mildly diminished breath sounds throughout without wheezes or crackles. Heart: Distant heart sounds. Regular rate and rhythm without murmurs, rubs, or gallops. Non-displaced PMI. Abd: Bowel sounds present. Soft, NT/ND without hepatosplenomegaly Ext: 1-2+ pretibial edema bilaterally. Skin: Warm and dry without rash.  EKG:  Normal sinus rhythm with LAFB and incomplete RBBB. No significant change since 08/18/16.  Lab Results  Component Value Date   WBC 8.0 09/03/2016   HGB 13.4 09/03/2016   HCT 40.5 09/03/2016   MCV 96 09/03/2016   PLT 295 09/03/2016    Lab Results  Component Value Date   NA 141 12/16/2016   K 4.7 12/16/2016   CL 102 12/16/2016   CO2 23 12/16/2016   BUN 27 12/16/2016   CREATININE 1.29 (H) 12/16/2016   GLUCOSE 118 (H) 12/16/2016   ALT 16 (L) 06/21/2016    Lab Results  Component Value Date   CHOL 122 06/22/2016   HDL 42 06/22/2016   LDLCALC 36 06/22/2016   LDLDIRECT 87.0 02/01/2014   TRIG 220 (H) 06/22/2016   CHOLHDL 2.9 06/22/2016     --------------------------------------------------------------------------------------------------  ASSESSMENT AND PLAN: Cryptogenic stroke Etiology remains uncertain; loop record has not shown any evidence of atrial fibrillation/flutter. Given recurrent nature of strokes, Mr. Wildey is now on apixaban per Dr. Erlinda Hong. I will defer ongoing management to Dr. Erlinda Hong. We will continue with routine ILR  follow-up through the device clinic.  Coronary artery disease Stable to improved dyspnea on exertion. No chest pain to suggest worsening coronary insufficiency. Continue medical therapy and risk factor modification, given non-obstructive CAD noted on LHC in 2015.  Thoracic aortic aneurysm No symptoms. CT chest in June showed stable mild enlargement of the ascending aorta, measuring up to 4.0 cm. Plan for annual follow-up and risk factor modification (lipid and BP control).  Hypertension Blood pressure is suboptimally controlled today. Given recent difficulty obtaining bisoprolol due to the medication being on backorder, we have agreed to switch to nebivolol 5 mg daily. I will stop HCTZ and restart furosemide 40 mg daily, given increasing leg edema. We will have Mr. Stites see the hypertension clinic in ~2 weeks when he returns for labs.  Hyperlipidemia LDL at goal this summer. Continue atorvastatin 40 mg daily.  Leg swelling Likely multifactorial, including an element of diastolic and/or right heart failure (though echo in 04/2016 notes normal diastolic parameters for age). We have agreed to discontinue HCTZ and restart furosemide 40 mg daily. Low sodium diet was encouraged. We will check a BMP today and again in ~2 weeks to ensure stable renal function and electrolytes.  Follow-up: Hypertension clinic in 2 weeks; return to see Richardson Dopp, PA, in 3 months.  Nelva Bush, MD 12/17/2016 1:37 PM

## 2016-12-16 NOTE — Patient Instructions (Addendum)
Medication Instructions:  Stop bisoprolol.  Stop HCTZ (hydrochlorothiazide)  Start lasix(furosemdie) 40 mg daily  Start Bystolic 5mg  daily  Labwork: Your physician recommends that you have lab today--BMET  Your physician recommends that you return for lab work in: about 2 weeks--BMET.   Testing/Procedures: None   Follow-Up: Your physician recommends that you schedule a follow-up appointment in: about 2 weeks for a Blood Pressure check with the pharmacist. Lab at this time also.   Your physician recommends that you schedule a follow-up appointment in: 3 months with Richardson Dopp, PA.        If you need a refill on your cardiac medications before your next appointment, please call your pharmacy.  s

## 2016-12-17 ENCOUNTER — Encounter: Payer: Self-pay | Admitting: Internal Medicine

## 2016-12-17 LAB — BASIC METABOLIC PANEL
BUN/Creatinine Ratio: 21 (ref 10–24)
BUN: 27 mg/dL (ref 8–27)
CHLORIDE: 102 mmol/L (ref 96–106)
CO2: 23 mmol/L (ref 20–29)
CREATININE: 1.29 mg/dL — AB (ref 0.76–1.27)
Calcium: 9.4 mg/dL (ref 8.6–10.2)
GFR calc Af Amer: 62 mL/min/{1.73_m2} (ref >59)
GFR calc non Af Amer: 54 mL/min/{1.73_m2} — ABNORMAL LOW (ref >59)
GLUCOSE: 118 mg/dL — AB (ref 65–99)
Potassium: 4.7 mmol/L (ref 3.5–5.2)
SODIUM: 141 mmol/L (ref 134–144)

## 2016-12-20 ENCOUNTER — Ambulatory Visit (INDEPENDENT_AMBULATORY_CARE_PROVIDER_SITE_OTHER): Payer: Medicare Other | Admitting: *Deleted

## 2016-12-20 DIAGNOSIS — I639 Cerebral infarction, unspecified: Secondary | ICD-10-CM

## 2016-12-21 NOTE — Progress Notes (Signed)
Carelink Summary Report / Loop Recorder 

## 2016-12-23 ENCOUNTER — Encounter: Payer: Self-pay | Admitting: Family Medicine

## 2016-12-23 ENCOUNTER — Ambulatory Visit (INDEPENDENT_AMBULATORY_CARE_PROVIDER_SITE_OTHER): Payer: Medicare Other | Admitting: Family Medicine

## 2016-12-23 VITALS — BP 126/70 | HR 98 | Temp 97.7°F | Resp 20 | Ht 71.0 in | Wt 232.0 lb

## 2016-12-23 DIAGNOSIS — R413 Other amnesia: Secondary | ICD-10-CM | POA: Diagnosis not present

## 2016-12-23 DIAGNOSIS — F331 Major depressive disorder, recurrent, moderate: Secondary | ICD-10-CM

## 2016-12-23 DIAGNOSIS — R454 Irritability and anger: Secondary | ICD-10-CM | POA: Diagnosis not present

## 2016-12-23 DIAGNOSIS — I639 Cerebral infarction, unspecified: Secondary | ICD-10-CM

## 2016-12-23 DIAGNOSIS — R4586 Emotional lability: Secondary | ICD-10-CM | POA: Diagnosis not present

## 2016-12-23 MED ORDER — ARIPIPRAZOLE 5 MG PO TABS: 5.0000 mg | ORAL_TABLET | Freq: Every day | ORAL | 3 refills | Status: DC

## 2016-12-23 NOTE — Progress Notes (Signed)
Subjective:    Patient ID: [name] [name], male    DOB: [birth year], 76 y.o.   MRN: [id number]  HPI 10/12/16 Wife brings him patient for evaluation to determine should he still be driving. Past medical history is significant for progressive multiple sclerosis, multiple ischemic strokes, chronic osteoarthritic pain on 3 time a day hydrocodone WHICH contribute to this discussion. Physically, the patient has muscle strength 5 over 5 equal and symmetric in the arms and in the legs with normal reflexes. His physical reaction time is normal to catching a dropped object. Cerebellar exam is normal today however on Mini-Mental status exam, the patient tells me that the year is 1983. He is unable to correctly identify the month. He can #2 out of 3 objects on recall. He scores 3 out of 5 on world spelling in reverse. He is unable to perform serial sevens. It takes him 25 seconds to draw a clock although he does draw correctly. However he is unable to recognize patterns or to correctly copy intersecting pentagons.  At that time, my plan was: Patient has age-related memory loss compounded by multiple ischemic strokes, compounded by progressive multiple sclerosis, compounded by polypharmacy and chronic narcotic use all of which makes him a danger to drive. In general, I recommended that the patient not drive. Legally, I believe he would still qualify for restricted status. He must drive less than 45 miles an hour. He must avoid Interstate driving or driving on highways and remain driving only on 2 lane roads where there is less travel. I would not recommend driving at night or during conditions that would compromise his reaction time. After discussing these restrictions I explained to the patient and his wife that if he were my father, I would not recommend driving.  71/24/58 Patient's wife removed his car and had it placed at her daughter's home. This angered the patient dramatically. Furthermore, the patient's wife  is concerned that he is extremely depressed and that he may harm himself. She also feels threatened and the home due to his outbursts of uncontrolled anger. Therefore she had her daughter and son-in-law will remove the guns from his home. Patient feels extremely upset by this. He feels disrespected. He feels violated. Patient's wife states that he is extremely angry. His mood swings are rapid and fluctuating. He is extremely depressed. He reports anhedonia. He is frustrated by his memory loss. He is frustrated by his inability to drive. He is frustrated by having his personal items removed from his home without his consent. He feels like he is losing his independence and his masculinity. In the past he has had symptoms of depression with bipolar tendencies. At one time we had him on Depakote as a mood stabilizer which seemed to help dramatically. He is also taking Abilify but this was discontinued due to weight gain. Patient's wife feels unsafe at home. Patient denies suicidality. Patient denies any homicidal ideation. He states that his wife is blowing things out of proportion. Past Medical History:  Diagnosis Date  . Arthritis    s/p TKR  . Bladder cancer (HCC)    [facility] low grade papillary urothileal carcinoma  . BPH (benign prostatic hyperplasia)   . Brugada syndrome    Possible Type II Brugada ECG pattern. No family history of SCD, no syncope, no tachypalpitations.  Marland Kitchen CAD (coronary artery disease)    a. LHC 1/15 - mid LCx 50 and 60%, proximal RCA 50%  . Chronic diastolic CHF (congestive heart failure) (  [place])   . CKD (chronic kidney disease)   . COPD (chronic obstructive pulmonary disease) ([place])    Prior heavy smoker. PFTs (3/11): FVC 87%, FEV1 73%, ratio 0.57, DLCO 75%, TLC 121%. Moderate obstructive defect. PFTs (9/15): Only minimal obstruction, sugPrior heavy smoker. PFTs (3/11): FVC 87%, FEV1 73%, ratio 0.57, DLCO 75%, TLC 121%. Moderate obstructive defect. PFTs (9/15) minimal obstruction,  poss asthma component   . Depression    with bipolar tendencies  . DOE (dyspnea on exertion)    a. Myoview 8/09- EF 57%, no ischemia // b. Myoview (3/11) EF 68%, diaphragmatic attenuation, no ischemia //  c Echo (4/11) EF 45-80%, mild diastolic dysfunction, PASP 38 mmHg // d. PFTs 9/15 minimal obstruction  /  e. CT negative for ILD  . GERD (gastroesophageal reflux disease)   . History of Doppler ultrasound    Carotid US 3/11 negative for significant stenosis  //  carotid US 6/17: Mild bilateral plaque, 1-39% ICA  . History of echocardiogram    a. Echo 12/14 Inferior and distal septal HK, mild LVH, EF 50-55%, mild MR, mild LAE  //  b. Echo 6/17: Mild focal basal septal hypertrophy, EF 60-65%, normal wall motion, grade 1 diastolic dysfunction, mild AI  . HLD (hyperlipidemia)   . Hypertensive heart disease with CHF (congestive heart failure) ([place]) 04/08/2009  . Low testosterone   . Lung nodule    a. CT in 2015 >> PET in 12/15 not sugg of malignancy   . LVH (left ventricular hypertrophy)    a. Echo 12/14 Inferior and distal septal HK, mild LVH, EF 50-55%, mild MR, mild LAE  . Mild dementia   . Mitral regurgitation    Echo (4/11) with PISA ERO 0.3 cm^2 and regurgitant volume 41 mL (moderate MR). Echo (12/14) with mild MR.   . MS (multiple sclerosis) ([place])   . MS (multiple sclerosis) ([place])   . Right bundle branch block   . Stroke ([place])   . Thoracic aortic aneurysm (HCC)    4.1 cm 2017  . Urinary retention with incomplete bladder emptying    receives botox injections and treatment for BPH through [facility]   Past Surgical History:  Procedure Laterality Date  . APPENDECTOMY    . EP IMPLANTABLE DEVICE N/A 06/23/2015   Procedure: Loop Recorder Insertion;  Surgeon: Deboraha Sprang, MD;  Location: [facility];  Service: Cardiovascular;  Laterality: N/A;  . HEMORRHOID SURGERY    . LEFT HEART CATHETERIZATION WITH CORONARY ANGIOGRAM N/A 01/05/2013   Procedure: LEFT HEART CATHETERIZATION WITH  CORONARY ANGIOGRAM;  Surgeon: Larey Dresser, MD;  Location: [facility] CATH LAB;  Service: Cardiovascular;  Laterality: N/A;  . ROTATOR CUFF REPAIR    . TEE WITHOUT CARDIOVERSION N/A 06/23/2015   Procedure: TRANSESOPHAGEAL ECHOCARDIOGRAM (TEE);  Surgeon: Fay Records, MD;  Location: [facility] ENDOSCOPY;  Service: Cardiovascular;  Laterality: N/A;  . TONSILLECTOMY    . TOTAL KNEE ARTHROPLASTY Right   . TRANSURETHRAL RESECTION OF PROSTATE     Current Outpatient Medications on File Prior to Visit  Medication Sig Dispense Refill  . acetaminophen (TYLENOL) 325 MG tablet Take 650 mg by mouth every 6 (six) hours as needed (for pain or headaches).     Marland Kitchen apixaban (ELIQUIS) 5 MG TABS tablet Take 1 tablet (5 mg total) by mouth 2 (two) times daily. 180 tablet 3  . atorvastatin (LIPITOR) 40 MG tablet Take 40 mg by mouth daily.    . cholecalciferol (VITAMIN D) 400 units TABS tablet  Take 400 Units by mouth daily.     . Cyanocobalamin (B-12) 1000 MCG CAPS Take 1,000 mcg by mouth daily.     . CycloSPORINE (RESTASIS OP) Apply to eye.    Marland Kitchen doxazosin (CARDURA) 2 MG tablet Take 1 tablet (2 mg total) by mouth daily. 90 tablet 1  . folic acid (FOLVITE) 1 MG tablet Take 1 mg by mouth daily.    . furosemide (LASIX) 40 MG tablet TAKE 1 TABLET(40 MG) BY MOUTH DAILY 90 tablet 1  . HYDROcodone-acetaminophen (NORCO) 10-325 MG tablet Take 1 tablet by mouth every 6 (six) hours as needed for moderate pain or severe pain. 100 tablet 0  . losartan (COZAAR) 100 MG tablet Take 1 tablet (100 mg total) by mouth daily. 90 tablet 3  . MAGNESIUM CITRATE PO Take 0.5-1 Bottles by mouth once as needed (for mild constipation).     . nebivolol (BYSTOLIC) 5 MG tablet Take 1 tablet (5 mg total) by mouth daily. 90 tablet 1  . pantoprazole (PROTONIX) 40 MG tablet Take 1 tablet (40 mg total) by mouth daily. 90 tablet 3  . PROAIR HFA 108 (90 Base) MCG/ACT inhaler USE 1 TO 2 INHALATIONS EVERY 6 HOURS AS NEEDED FOR WHEEZING OR SHORTNESS OF BREATH 25.5 g 4  .  rOPINIRole (REQUIP) 1 MG tablet TAKE 1 TABLET AT BEDTIME 90 tablet 3  . Tiotropium Bromide Monohydrate (SPIRIVA RESPIMAT) 2.5 MCG/ACT AERS Inhale 2 puffs into the lungs daily. 3 Inhaler 3  . venlafaxine XR (EFFEXOR-XR) 150 MG 24 hr capsule TAKE 1 CAPSULE TWICE A DAY 180 capsule 3   No current facility-administered medications on file prior to visit.    Allergies  Allergen Reactions  . Acetylcholine     Patient was suspected of having "Brugada Syndrome": (BrS) is a genetic condition that results in abnormal electrical activity within the heart, increasing the risk of sudden cardiac death. Those affected may have episodes of passing out.  Marland Kitchen Alcohol-Sulfur [Sulfur] Other (See Comments)    Patient was suspected of having "Brugada Syndrome"  . Amitriptyline Other (See Comments)    Patient was suspected of having "Brugada Syndrome"  . Bupivacaine Other (See Comments)    Patient was suspected of having "Brugada Syndrome"  . Clomipramine Hcl Other (See Comments)    Patient was suspected of having "Brugada Syndrome"  . Cocaine Other (See Comments)    Patient was suspected of having "Brugada Syndrome"  . Desipramine Other (See Comments)    Patient was suspected of having "Brugada Syndrome"  . Ergonovine Other (See Comments)    Patient was suspected of having "Brugada Syndrome"  . Flecainide Other (See Comments)    Patient was suspected of having "Brugada Syndrome"  . Lithium Other (See Comments)    Patient was suspected of having "Brugada Syndrome"  . Loxapine Other (See Comments)    Patient was suspected of having "Brugada Syndrome"  . Nortriptyline Other (See Comments)    Patient was suspected of having "Brugada Syndrome"  . Oxcarbazepine Other (See Comments)    Patient was suspected of having "Brugada Syndrome"  . Procainamide Other (See Comments)    Patient was suspected of having "Brugada Syndrome"  . Procaine Other (See Comments)    Patient was suspected of having "Brugada Syndrome"    . Propafenone Other (See Comments)    Patient was suspected of having "Brugada Syndrome"  . Propofol Other (See Comments)    Patient was suspected of having "Brugada Syndrome"  . Trifluoperazine Other (See Comments)  Patient was suspected of having "Brugada Syndrome"   Social History   Socioeconomic History  . Marital status: Married    Spouse name: Not on file  . Number of children: Not on file  . Years of education: Not on file  . Highest education level: Not on file  Social Needs  . Financial resource strain: Not on file  . Food insecurity - worry: Not on file  . Food insecurity - inability: Not on file  . Transportation needs - medical: Not on file  . Transportation needs - non-medical: Not on file  Occupational History  . Occupation: retired    Fish farm manager: RETIRED  Tobacco Use  . Smoking status: Former Smoker    Packs/day: 2.00    Years: 40.00    Pack years: 80.00    Types: Cigarettes    Last attempt to quit: 01/04/1993    Years since quitting: 23.9  . Smokeless tobacco: Former Systems developer    Types: Chew  Substance and Sexual Activity  . Alcohol use: Yes    Alcohol/week: 15.0 oz    Types: 25 Cans of beer per week    Comment: 12 pack a week  . Drug use: No  . Sexual activity: Not on file  Other Topics Concern  . Not on file  Social History Narrative   Retired from the [employer] after 20 years   [place] Veteran      Review of Systems  All other systems reviewed and are negative.      Objective:   Physical Exam  Constitutional: He is oriented to person, place, and time. He appears well-developed and well-nourished. No distress.  Cardiovascular: Normal rate, regular rhythm, normal heart sounds and intact distal pulses.  No murmur heard. Pulmonary/Chest: Effort normal and breath sounds normal. No respiratory distress. He has no wheezes. He has no rales.  Neurological: He is alert and oriented to person, place, and time. He has normal reflexes. No cranial nerve  deficit. He exhibits normal muscle tone. Coordination normal.  Skin: He is not diaphoretic.  Psychiatric: He has a normal mood and affect. His behavior is normal. Judgment and thought content normal.  Vitals reviewed.         Assessment & Plan:  Memory loss due to medical condition  Moderate episode of recurrent major depressive disorder (HCC)  Mood swings  Irritability and anger  I feel both the patient and his wife have valid arguments. The wife does not feel safe with him having guns in the home nor does she feel safe with him driving. Patient is frustrated by his loss of independence and the perceived disrespect of having his guns and car taken from him without his consent. Wife states that she did not consent the patient because she was afraid of his uncontrolled anger when she broached the subject with him couple of his memory loss and poor judgment and impulsivity. Therefore I will treat the patient with Abilify 5 mg by mouth daily as a mood stabilizer and management for depression and recheck the patient in 2 weeks. I have agreed that he should not be driving. I have also agreed that there should not be loaded weapons in the home. However I agreed that the patient should be asked and talked to is a human being and that showing him disrespect may help mollify some of his anger and frustration. Patient and wife agree to try

## 2016-12-24 ENCOUNTER — Other Ambulatory Visit: Payer: Self-pay | Admitting: Family Medicine

## 2016-12-24 ENCOUNTER — Other Ambulatory Visit: Payer: Medicare Other

## 2016-12-24 DIAGNOSIS — R4586 Emotional lability: Secondary | ICD-10-CM

## 2016-12-24 DIAGNOSIS — R410 Disorientation, unspecified: Secondary | ICD-10-CM | POA: Diagnosis not present

## 2016-12-24 LAB — URINALYSIS, ROUTINE W REFLEX MICROSCOPIC
BILIRUBIN URINE: NEGATIVE
GLUCOSE, UA: NEGATIVE
Hgb urine dipstick: NEGATIVE
Ketones, ur: NEGATIVE
NITRITE: POSITIVE — AB
Protein, ur: NEGATIVE
RBC / HPF: NONE SEEN /HPF (ref 0–2)
SPECIFIC GRAVITY, URINE: 1.02 (ref 1.001–1.03)
Squamous Epithelial / LPF: NONE SEEN /HPF (ref <=5)
pH: 5.5 (ref 5.0–8.0)

## 2016-12-24 LAB — MICROSCOPIC MESSAGE

## 2016-12-24 MED ORDER — CIPROFLOXACIN HCL 500 MG PO TABS: 500.0000 mg | ORAL_TABLET | Freq: Two times a day (BID) | ORAL | 0 refills | Status: DC

## 2016-12-24 NOTE — Addendum Note (Signed)
Addended by: Shary Decamp B on: 12/24/2016 11:35 AM   Modules accepted: Orders

## 2016-12-28 LAB — URINE CULTURE
MICRO NUMBER: 81439682
SPECIMEN QUALITY: ADEQUATE

## 2016-12-29 ENCOUNTER — Other Ambulatory Visit: Payer: Medicare Other | Admitting: *Deleted

## 2016-12-29 ENCOUNTER — Ambulatory Visit (INDEPENDENT_AMBULATORY_CARE_PROVIDER_SITE_OTHER): Payer: Medicare Other | Admitting: Pharmacist

## 2016-12-29 ENCOUNTER — Other Ambulatory Visit: Payer: Self-pay | Admitting: Internal Medicine

## 2016-12-29 VITALS — BP 140/84 | HR 107

## 2016-12-29 DIAGNOSIS — R0602 Shortness of breath: Secondary | ICD-10-CM | POA: Diagnosis not present

## 2016-12-29 DIAGNOSIS — I10 Essential (primary) hypertension: Secondary | ICD-10-CM

## 2016-12-29 DIAGNOSIS — I639 Cerebral infarction, unspecified: Secondary | ICD-10-CM

## 2016-12-29 MED ORDER — METOPROLOL SUCCINATE ER 100 MG PO TB24: 100.0000 mg | ORAL_TABLET | Freq: Every day | ORAL | 1 refills | Status: DC

## 2016-12-29 MED ORDER — METOPROLOL SUCCINATE ER 100 MG PO TB24: 100.0000 mg | ORAL_TABLET | Freq: Every day | ORAL | 0 refills | Status: DC

## 2016-12-29 NOTE — Addendum Note (Signed)
Addended by: Eulis Foster on: 12/29/2016 12:50 PM   Modules accepted: Orders

## 2016-12-29 NOTE — Patient Instructions (Addendum)
Return for a follow up appointment in 2-3 weeks  Your blood pressure goal is less than 130/80   Check your blood pressure at home daily (if able) and keep record of the readings.  Take your BP meds as follows: STOP Bystolic START metoprolol 100mg  once daily    Bring all of your meds, your BP cuff and your record of home blood pressures to your next appointment.  Exercise as you're able, try to walk approximately 30 minutes per day.  Keep salt intake to a minimum, especially watch canned and prepared boxed foods.  Eat more fresh fruits and vegetables and fewer canned items.  Avoid eating in fast food restaurants.    HOW TO TAKE YOUR BLOOD PRESSURE: . Rest 5 minutes before taking your blood pressure. .  Don't smoke or drink caffeinated beverages for at least 30 minutes before. . Take your blood pressure before (not after) you eat. . Sit comfortably with your back supported and both feet on the floor (don't cross your legs). . Elevate your arm to heart level on a table or a desk. . Use the proper sized cuff. It should fit smoothly and snugly around your bare upper arm. There should be enough room to slip a fingertip under the cuff. The bottom edge of the cuff should be 1 inch above the crease of the elbow. . Ideally, take 3 measurements at one sitting and record the average.

## 2016-12-29 NOTE — Progress Notes (Signed)
Patient ID: [name]                 DOB: August 04, 1940                      MRN: [id number]     HPI: [name] [name] is a 76 y.o. male patient of Dr.End who presents today for hypertension evaluation. PMH includes mod non-obstructive CAD on LHC in 3/61, diastolic HF, HTN, HL, GERD, COPD, multiple prior strokes, multiple sclerosis,AKI assoc with NSAID use. He has struggled with bipolar depression. He was admitted in 06/2016 with recurrent stroke. He was seen by Dr. Saunders Revel on 12/16/2016 and his bisoprolol was changed to Bystolic (due to backorder) and his HCTZ to furosemide for suspected fluid overload.   He presents today with his wife. They report he is doing well on his current medication regimen. They request Bystolic samples as medication is pricey. They also report that swelling/fluid level seems to be better on furosemide. He does occasionally suffer from SOB they believe due to respiratory ailments. He denies chest pain and dizziness, but does state that he is lethargic and wanting something to increase energy.   He was started on cipro a few days ago for UTI and has a few days left of therapy. He is out of Bystolic 5mg  daily. He did take it last night. He reports a headache which seems to have improved after moving a few medications to evening dosing.    Current HTN meds:  Doxazosin 2mg  daily in the morning Furosemide 40mg  daily  Losartan 100mg  daily  Bystolic 5mg  daily in the evening   Previously tried: bisoprolol - backorder, amlodipine - uncontrolled pressure  BP goal: <130/80  Family History: father - stroke, mother - stroke, sister - HTN,   Social History: former smoker and chew. Drinks 25 cans of beer per week.   Diet: He has been eating more recently. He does not add salt to his food. He does eat out more recently than before. He and his wife plan to start eating better and walking more.   Exercise: plans to start working out today. He has put on a lot of weight  since starting abilify (which has been stopped).   Home BP readings: 144/90 - before morning meds  Wt Readings from Last 3 Encounters:  12/23/16 232 lb (105.2 kg)  12/16/16 233 lb 8 oz (105.9 kg)  12/16/16 231 lb (104.8 kg)   BP Readings from Last 3 Encounters:  12/29/16 140/84  12/23/16 126/70  12/16/16 (!) 150/68   Pulse Readings from Last 3 Encounters:  12/29/16 (!) 107  12/23/16 98  12/16/16 100    Renal function: Estimated Creatinine Clearance: 61.1 mL/min (A) (by C-G formula based on SCr of 1.29 mg/dL (H)).  Past Medical History:  Diagnosis Date  . Arthritis    s/p TKR  . Bladder cancer (HCC)    [facility], [facility] low grade papillary urothileal carcinoma  . BPH (benign prostatic hyperplasia)   . Brugada syndrome    Possible Type II Brugada ECG pattern. No family history of SCD, no syncope, no tachypalpitations.  Marland Kitchen CAD (coronary artery disease)    a. LHC 1/15 - mid LCx 50 and 60%, proximal RCA 50%  . Chronic diastolic CHF (congestive heart failure) ([place])   . CKD (chronic kidney disease)   . COPD (chronic obstructive pulmonary disease) ([place])    Prior heavy smoker. PFTs (3/11): FVC 87%, FEV1 73%, ratio 0.57, DLCO 75%,  TLC 121%. Moderate obstructive defect. PFTs (9/15): Only minimal obstruction, sugPrior heavy smoker. PFTs (3/11): FVC 87%, FEV1 73%, ratio 0.57, DLCO 75%, TLC 121%. Moderate obstructive defect. PFTs (9/15) minimal obstruction, poss asthma component   . Depression    with bipolar tendencies  . DOE (dyspnea on exertion)    a. Myoview 8/09- EF 57%, no ischemia // b. Myoview (3/11) EF 68%, diaphragmatic attenuation, no ischemia //  c Echo (4/11) EF 61-95%, mild diastolic dysfunction, PASP 38 mmHg // d. PFTs 9/15 minimal obstruction  /  e. CT negative for ILD  . GERD (gastroesophageal reflux disease)   . History of Doppler ultrasound    Carotid US 3/11 negative for significant stenosis  //  carotid US 6/17: Mild bilateral plaque, 1-39% ICA  . History of echocardiogram      a. Echo 12/14 Inferior and distal septal HK, mild LVH, EF 50-55%, mild MR, mild LAE  //  b. Echo 6/17: Mild focal basal septal hypertrophy, EF 60-65%, normal wall motion, grade 1 diastolic dysfunction, mild AI  . HLD (hyperlipidemia)   . Hypertensive heart disease with CHF (congestive heart failure) ([place]) 04/08/2009  . Low testosterone   . Lung nodule    a. CT in 2015 >> PET in 12/15 not sugg of malignancy   . LVH (left ventricular hypertrophy)    a. Echo 12/14 Inferior and distal septal HK, mild LVH, EF 50-55%, mild MR, mild LAE  . Mild dementia   . Mitral regurgitation    Echo (4/11) with PISA ERO 0.3 cm^2 and regurgitant volume 41 mL (moderate MR). Echo (12/14) with mild MR.   . MS (multiple sclerosis) ([place])   . MS (multiple sclerosis) ([place])   . Right bundle branch block   . Stroke ([place])   . Thoracic aortic aneurysm (HCC)    4.1 cm 2017  . Urinary retention with incomplete bladder emptying    receives botox injections and treatment for BPH through [facility]    Current Outpatient Medications on File Prior to Visit  Medication Sig Dispense Refill  . acetaminophen (TYLENOL) 325 MG tablet Take 650 mg by mouth every 6 (six) hours as needed (for pain or headaches).     Marland Kitchen apixaban (ELIQUIS) 5 MG TABS tablet Take 1 tablet (5 mg total) by mouth 2 (two) times daily. 180 tablet 3  . ARIPiprazole (ABILIFY) 5 MG tablet Take 1 tablet (5 mg total) by mouth daily. 30 tablet 3  . atorvastatin (LIPITOR) 40 MG tablet Take 40 mg by mouth daily.    . cholecalciferol (VITAMIN D) 400 units TABS tablet Take 400 Units by mouth daily.     . ciprofloxacin (CIPRO) 500 MG tablet Take 1 tablet (500 mg total) by mouth 2 (two) times daily. 20 tablet 0  . Cyanocobalamin (B-12) 1000 MCG CAPS Take 1,000 mcg by mouth daily.     . CycloSPORINE (RESTASIS OP) Apply to eye.    Marland Kitchen doxazosin (CARDURA) 2 MG tablet Take 1 tablet (2 mg total) by mouth daily. 90 tablet 1  . folic acid (FOLVITE) 1 MG tablet Take 1 mg by mouth  daily.    . furosemide (LASIX) 40 MG tablet TAKE 1 TABLET(40 MG) BY MOUTH DAILY 90 tablet 1  . HYDROcodone-acetaminophen (NORCO) 10-325 MG tablet Take 1 tablet by mouth every 6 (six) hours as needed for moderate pain or severe pain. 100 tablet 0  . losartan (COZAAR) 100 MG tablet Take 1 tablet (100 mg total) by mouth daily. 90 tablet 3  .  MAGNESIUM CITRATE PO Take 0.5-1 Bottles by mouth once as needed (for mild constipation).     . pantoprazole (PROTONIX) 40 MG tablet Take 1 tablet (40 mg total) by mouth daily. 90 tablet 3  . PROAIR HFA 108 (90 Base) MCG/ACT inhaler USE 1 TO 2 INHALATIONS EVERY 6 HOURS AS NEEDED FOR WHEEZING OR SHORTNESS OF BREATH 25.5 g 4  . rOPINIRole (REQUIP) 1 MG tablet TAKE 1 TABLET AT BEDTIME 90 tablet 3  . Tiotropium Bromide Monohydrate (SPIRIVA RESPIMAT) 2.5 MCG/ACT AERS Inhale 2 puffs into the lungs daily. 3 Inhaler 3  . venlafaxine XR (EFFEXOR-XR) 150 MG 24 hr capsule TAKE 1 CAPSULE TWICE A DAY (Patient taking differently: TAKE 2 CAPSULEs once A DAY) 180 capsule 3   No current facility-administered medications on file prior to visit.     Allergies  Allergen Reactions  . Acetylcholine     Patient was suspected of having "Brugada Syndrome": (BrS) is a genetic condition that results in abnormal electrical activity within the heart, increasing the risk of sudden cardiac death. Those affected may have episodes of passing out.  Marland Kitchen Alcohol-Sulfur [Sulfur] Other (See Comments)    Patient was suspected of having "Brugada Syndrome"  . Amitriptyline Other (See Comments)    Patient was suspected of having "Brugada Syndrome"  . Bupivacaine Other (See Comments)    Patient was suspected of having "Brugada Syndrome"  . Clomipramine Hcl Other (See Comments)    Patient was suspected of having "Brugada Syndrome"  . Cocaine Other (See Comments)    Patient was suspected of having "Brugada Syndrome"  . Desipramine Other (See Comments)    Patient was suspected of having "Brugada  Syndrome"  . Ergonovine Other (See Comments)    Patient was suspected of having "Brugada Syndrome"  . Flecainide Other (See Comments)    Patient was suspected of having "Brugada Syndrome"  . Lithium Other (See Comments)    Patient was suspected of having "Brugada Syndrome"  . Loxapine Other (See Comments)    Patient was suspected of having "Brugada Syndrome"  . Nortriptyline Other (See Comments)    Patient was suspected of having "Brugada Syndrome"  . Oxcarbazepine Other (See Comments)    Patient was suspected of having "Brugada Syndrome"  . Procainamide Other (See Comments)    Patient was suspected of having "Brugada Syndrome"  . Procaine Other (See Comments)    Patient was suspected of having "Brugada Syndrome"  . Propafenone Other (See Comments)    Patient was suspected of having "Brugada Syndrome"  . Propofol Other (See Comments)    Patient was suspected of having "Brugada Syndrome"  . Trifluoperazine Other (See Comments)    Patient was suspected of having "Brugada Syndrome"    Blood pressure 140/84, pulse (!) 107.   Assessment/Plan: Hypertension: BMET today. BP is not at goal today and HR elevated. Bystolic tends to not be as potent on HR and is costly to patient currently as still branded medication. Will change to another betablocker for better HR control and hopefully better blood pressure control. Carvedilol would be ideal for blood pressure control and HR, but given COPD and occasional SOB would prefer beta selective BB. Will start metoprolol succinate 100mg  daily and follow up in HTN clinic in 2-3 weeks.    Thank you, Lelan Pons. Patterson Hammersmith, [facility]  12/29/2016 2:42 PM

## 2016-12-30 ENCOUNTER — Other Ambulatory Visit: Payer: Medicare Other

## 2016-12-30 LAB — BASIC METABOLIC PANEL
BUN / CREAT RATIO: 16 (ref 10–24)
BUN: 21 mg/dL (ref 8–27)
CALCIUM: 9.3 mg/dL (ref 8.6–10.2)
CHLORIDE: 103 mmol/L (ref 96–106)
CO2: 23 mmol/L (ref 20–29)
Creatinine, Ser: 1.29 mg/dL — ABNORMAL HIGH (ref 0.76–1.27)
GFR, EST AFRICAN AMERICAN: 62 mL/min/{1.73_m2} (ref >59)
GFR, EST NON AFRICAN AMERICAN: 54 mL/min/{1.73_m2} — AB (ref >59)
Glucose: 125 mg/dL — ABNORMAL HIGH (ref 65–99)
POTASSIUM: 4.2 mmol/L (ref 3.5–5.2)
SODIUM: 142 mmol/L (ref 134–144)

## 2016-12-31 ENCOUNTER — Other Ambulatory Visit: Payer: Self-pay | Admitting: Family Medicine

## 2016-12-31 DIAGNOSIS — N289 Disorder of kidney and ureter, unspecified: Secondary | ICD-10-CM

## 2017-01-05 ENCOUNTER — Other Ambulatory Visit: Payer: Self-pay | Admitting: Family Medicine

## 2017-01-05 DIAGNOSIS — G4700 Insomnia, unspecified: Secondary | ICD-10-CM | POA: Diagnosis not present

## 2017-01-05 LAB — CUP PACEART REMOTE DEVICE CHECK
Implantable Pulse Generator Implant Date: 20170619
MDC IDC SESS DTM: 20181217144225

## 2017-01-05 NOTE — Telephone Encounter (Signed)
Patient is requesting a refill on Hydrocodone   LOV: 12/23/16  LRF:   11/24/16

## 2017-01-06 MED ORDER — HYDROCODONE-ACETAMINOPHEN 10-325 MG PO TABS: 1.0000 | ORAL_TABLET | Freq: Four times a day (QID) | ORAL | 0 refills | Status: DC | PRN

## 2017-01-10 ENCOUNTER — Institutional Professional Consult (permissible substitution): Payer: TRICARE For Life (TFL) | Admitting: Neurology

## 2017-01-14 ENCOUNTER — Encounter: Payer: Self-pay | Admitting: Family Medicine

## 2017-01-14 ENCOUNTER — Ambulatory Visit (INDEPENDENT_AMBULATORY_CARE_PROVIDER_SITE_OTHER): Payer: Medicare Other | Admitting: Family Medicine

## 2017-01-14 VITALS — BP 146/80 | HR 100 | Temp 97.6°F | Resp 20 | Ht 71.0 in | Wt 238.0 lb

## 2017-01-14 DIAGNOSIS — R454 Irritability and anger: Secondary | ICD-10-CM

## 2017-01-14 DIAGNOSIS — N289 Disorder of kidney and ureter, unspecified: Secondary | ICD-10-CM

## 2017-01-14 DIAGNOSIS — R413 Other amnesia: Secondary | ICD-10-CM

## 2017-01-14 DIAGNOSIS — R4586 Emotional lability: Secondary | ICD-10-CM

## 2017-01-14 NOTE — Progress Notes (Signed)
Subjective:    Patient ID: [name] [name], male    DOB: June 19, 1940, 77 y.o.   MRN: [id number]  HPI10/9/18 Wife brings him patient for evaluation to determine should he still be driving. Past medical history is significant for progressive multiple sclerosis, multiple ischemic strokes, chronic osteoarthritic pain on 3 time a day hydrocodone WHICH contribute to this discussion. Physically, the patient has muscle strength 5 over 5 equal and symmetric in the arms and in the legs with normal reflexes. His physical reaction time is normal to catching a dropped object. Cerebellar exam is normal today however on Mini-Mental status exam, the patient tells me that the year is 1983. He is unable to correctly identify the month. He can #2 out of 3 objects on recall. He scores 3 out of 5 on world spelling in reverse. He is unable to perform serial sevens. It takes him 25 seconds to draw a clock although he does draw correctly. However he is unable to recognize patterns or to correctly copy intersecting pentagons.  At that time, my plan was: Patient has age-related memory loss compounded by multiple ischemic strokes, compounded by progressive multiple sclerosis, compounded by polypharmacy and chronic narcotic use all of which makes him a danger to drive. In general, I recommended that the patient not drive. Legally, I believe he would still qualify for restricted status. He must drive less than 45 miles an hour. He must avoid Interstate driving or driving on highways and remain driving only on 2 lane roads where there is less travel. I would not recommend driving at night or during conditions that would compromise his reaction time. After discussing these restrictions I explained to the patient and his wife that if he were my father, I would not recommend driving.  36/14/43 Patient's wife removed his car and had it placed at her daughter's home. This angered the patient dramatically. Furthermore, the patient's wife  is concerned that he is extremely depressed and that he may harm himself. She also feels scared in the home due to his outbursts of uncontrolled anger. Therefore she had her daughter and son-in-law remove the guns from his home. Patient feels extremely upset by this. He feels disrespected. He feels violated. Patient's wife states that he is extremely angry. His mood swings are rapid and fluctuating. He is extremely depressed. He reports anhedonia. He is frustrated by his memory loss. He is frustrated by his inability to drive. He is frustrated by having his personal items removed from his home without his consent. He feels like he is losing his independence and his masculinity. In the past he has had symptoms of depression with bipolar tendencies. At one time we had him on Depakote as a mood stabilizer which seemed to help dramatically. He had also taken    Abilify but this was discontinued due to weight gain. Patient's wife feels unsafe at home. Patient denies suicidality. Patient denies any homicidal ideation. He states that his wife is blowing things out of proportion.  At that time, my plan was: I feel both the patient and his wife have valid arguments. The wife does not feel safe with him having guns in the home nor does she feel safe with him driving. Patient is frustrated by his loss of independence and the perceived disrespect of having his guns and car taken from him without his consent. Wife states that she did not consent the patient because she was afraid of his uncontrolled anger when she broached the subject with  him couple of his memory loss and poor judgment and impulsivity. Therefore I will treat the patient with Abilify 5 mg by mouth daily as a mood stabilizer and management for depression and recheck the patient in 2 weeks. I have agreed that he should not be driving. I have also agreed that there should not be loaded weapons in the home. However I agreed that the patient should be asked and  talked to as a human being and that showing him respect may help mollify some of his anger and frustration. Patient and wife agree to try.  01/14/17 Here for follow up.  Both the patient and wife reports significant improvement since starting Abilify. His mood swings have improved. His anger is not as extreme and he is better able to control his temper. However he has gained 6 pounds since starting the medication. He denies any other side effects from the pills. He was also found to have renal insufficiency before Christmas. Urinalysis showed Klebsiella urinary tract infection for which he finished his antibiotics. He is due to recheck a urine culture as well as his renal function. Past Medical History:  Diagnosis Date  . Arthritis    s/p TKR  . Bladder cancer (HCC)    [facility], [place] low grade papillary urothileal carcinoma  . BPH (benign prostatic hyperplasia)   . Brugada syndrome    Possible Type II Brugada ECG pattern. No family history of SCD, no syncope, no tachypalpitations.  Marland Kitchen CAD (coronary artery disease)    a. LHC 1/15 - mid LCx 50 and 60%, proximal RCA 50%  . Chronic diastolic CHF (congestive heart failure) ([place])   . CKD (chronic kidney disease)   . COPD (chronic obstructive pulmonary disease) ([place])    Prior heavy smoker. PFTs (3/11): FVC 87%, FEV1 73%, ratio 0.57, DLCO 75%, TLC 121%. Moderate obstructive defect. PFTs (9/15): Only minimal obstruction, sugPrior heavy smoker. PFTs (3/11): FVC 87%, FEV1 73%, ratio 0.57, DLCO 75%, TLC 121%. Moderate obstructive defect. PFTs (9/15) minimal obstruction, poss asthma component   . Depression    with bipolar tendencies  . DOE (dyspnea on exertion)    a. Myoview 8/09- EF 57%, no ischemia // b. Myoview (3/11) EF 68%, diaphragmatic attenuation, no ischemia //  c Echo (4/11) EF 62-95%, mild diastolic dysfunction, PASP 38 mmHg // d. PFTs 9/15 minimal obstruction  /  e. CT negative for ILD  . GERD (gastroesophageal reflux disease)   . History of Doppler  ultrasound    Carotid US 3/11 negative for significant stenosis  //  carotid US 6/17: Mild bilateral plaque, 1-39% ICA  . History of echocardiogram    a. Echo 12/14 Inferior and distal septal HK, mild LVH, EF 50-55%, mild MR, mild LAE  //  b. Echo 6/17: Mild focal basal septal hypertrophy, EF 60-65%, normal wall motion, grade 1 diastolic dysfunction, mild AI  . HLD (hyperlipidemia)   . Hypertensive heart disease with CHF (congestive heart failure) ([place]) 04/08/2009  . Low testosterone   . Lung nodule    a. CT in 2015 >> PET in 12/15 not sugg of malignancy   . LVH (left ventricular hypertrophy)    a. Echo 12/14 Inferior and distal septal HK, mild LVH, EF 50-55%, mild MR, mild LAE  . Mild dementia   . Mitral regurgitation    Echo (4/11) with PISA ERO 0.3 cm^2 and regurgitant volume 41 mL (moderate MR). Echo (12/14) with mild MR.   . MS (multiple sclerosis) ([place])   . MS (  multiple sclerosis) ([place])   . Right bundle branch block   . Stroke ([place])   . Thoracic aortic aneurysm (HCC)    4.1 cm 2017  . Urinary retention with incomplete bladder emptying    receives botox injections and treatment for BPH through [facility]   Past Surgical History:  Procedure Laterality Date  . APPENDECTOMY    . EP IMPLANTABLE DEVICE N/A 06/23/2015   Procedure: Loop Recorder Insertion;  Surgeon: Deboraha Sprang, MD;  Location: [place] [facility];  Service: Cardiovascular;  Laterality: N/A;  . HEMORRHOID SURGERY    . LEFT HEART CATHETERIZATION WITH CORONARY ANGIOGRAM N/A 01/05/2013   Procedure: LEFT HEART CATHETERIZATION WITH CORONARY ANGIOGRAM;  Surgeon: Larey Dresser, MD;  Location[facility] CATH LAB;  Service: Cardiovascular;  Laterality: N/A;  . ROTATOR CUFF REPAIR    . TEE WITHOUT CARDIOVERSION N/A 06/23/2015   Procedure: TRANSESOPHAGEAL ECHOCARDIOGRAM (TEE);  Surgeon: Fay Records, MD;  Location: [facility];  Service: Cardiovascular;  Laterality: N/A;  . TONSILLECTOMY    . TOTAL KNEE ARTHROPLASTY Right   .  TRANSURETHRAL RESECTION OF PROSTATE     Current Outpatient Medications on File Prior to Visit  Medication Sig Dispense Refill  . acetaminophen (TYLENOL) 325 MG tablet Take 650 mg by mouth every 6 (six) hours as needed (for pain or headaches).     Marland Kitchen apixaban (ELIQUIS) 5 MG TABS tablet Take 1 tablet (5 mg total) by mouth 2 (two) times daily. 180 tablet 3  . ARIPiprazole (ABILIFY) 5 MG tablet Take 1 tablet (5 mg total) by mouth daily. 30 tablet 3  . atorvastatin (LIPITOR) 40 MG tablet Take 40 mg by mouth daily.    . cholecalciferol (VITAMIN D) 400 units TABS tablet Take 400 Units by mouth daily.     . ciprofloxacin (CIPRO) 500 MG tablet Take 1 tablet (500 mg total) by mouth 2 (two) times daily. 20 tablet 0  . Cyanocobalamin (B-12) 1000 MCG CAPS Take 1,000 mcg by mouth daily.     . CycloSPORINE (RESTASIS OP) Apply to eye.    Marland Kitchen doxazosin (CARDURA) 2 MG tablet Take 1 tablet (2 mg total) by mouth daily. 90 tablet 1  . folic acid (FOLVITE) 1 MG tablet Take 1 mg by mouth daily.    . furosemide (LASIX) 40 MG tablet TAKE 1 TABLET(40 MG) BY MOUTH DAILY 90 tablet 1  . HYDROcodone-acetaminophen (NORCO) 10-325 MG tablet Take 1 tablet by mouth every 6 (six) hours as needed for moderate pain or severe pain. 100 tablet 0  . MAGNESIUM CITRATE PO Take 0.5-1 Bottles by mouth once as needed (for mild constipation).     . metoprolol succinate (TOPROL-XL) 100 MG 24 hr tablet Take 1 tablet (100 mg total) by mouth daily. Take with or immediately following a meal. 90 tablet 1  . pantoprazole (PROTONIX) 40 MG tablet Take 1 tablet (40 mg total) by mouth daily. 90 tablet 3  . PROAIR HFA 108 (90 Base) MCG/ACT inhaler USE 1 TO 2 INHALATIONS EVERY 6 HOURS AS NEEDED FOR WHEEZING OR SHORTNESS OF BREATH 25.5 g 4  . rOPINIRole (REQUIP) 1 MG tablet TAKE 1 TABLET AT BEDTIME 90 tablet 3  . Tiotropium Bromide Monohydrate (SPIRIVA RESPIMAT) 2.5 MCG/ACT AERS Inhale 2 puffs into the lungs daily. 3 Inhaler 3  . venlafaxine XR (EFFEXOR-XR)  150 MG 24 hr capsule TAKE 1 CAPSULE TWICE A DAY (Patient taking differently: TAKE 2 CAPSULEs once A DAY) 180 capsule 3  . losartan (COZAAR) 100 MG tablet  Take 1 tablet (100 mg total) by mouth daily. 90 tablet 3   No current facility-administered medications on file prior to visit.    Allergies  Allergen Reactions  . Acetylcholine     Patient was suspected of having "Brugada Syndrome": (BrS) is a genetic condition that results in abnormal electrical activity within the heart, increasing the risk of sudden cardiac death. Those affected may have episodes of passing out.  Marland Kitchen Alcohol-Sulfur [Sulfur] Other (See Comments)    Patient was suspected of having "Brugada Syndrome"  . Amitriptyline Other (See Comments)    Patient was suspected of having "Brugada Syndrome"  . Bupivacaine Other (See Comments)    Patient was suspected of having "Brugada Syndrome"  . Clomipramine Hcl Other (See Comments)    Patient was suspected of having "Brugada Syndrome"  . Cocaine Other (See Comments)    Patient was suspected of having "Brugada Syndrome"  . Desipramine Other (See Comments)    Patient was suspected of having "Brugada Syndrome"  . Ergonovine Other (See Comments)    Patient was suspected of having "Brugada Syndrome"  . Flecainide Other (See Comments)    Patient was suspected of having "Brugada Syndrome"  . Lithium Other (See Comments)    Patient was suspected of having "Brugada Syndrome"  . Loxapine Other (See Comments)    Patient was suspected of having "Brugada Syndrome"  . Nortriptyline Other (See Comments)    Patient was suspected of having "Brugada Syndrome"  . Oxcarbazepine Other (See Comments)    Patient was suspected of having "Brugada Syndrome"  . Procainamide Other (See Comments)    Patient was suspected of having "Brugada Syndrome"  . Procaine Other (See Comments)    Patient was suspected of having "Brugada Syndrome"  . Propafenone Other (See Comments)    Patient was suspected of  having "Brugada Syndrome"  . Propofol Other (See Comments)    Patient was suspected of having "Brugada Syndrome"  . Trifluoperazine Other (See Comments)    Patient was suspected of having "Brugada Syndrome"   Social History   Socioeconomic History  . Marital status: Married    Spouse name: Not on file  . Number of children: Not on file  . Years of education: Not on file  . Highest education level: Not on file  Social Needs  . Financial resource strain: Not on file  . Food insecurity - worry: Not on file  . Food insecurity - inability: Not on file  . Transportation needs - medical: Not on file  . Transportation needs - non-medical: Not on file  Occupational History  . Occupation: retired    Fish farm manager: RETIRED  Tobacco Use  . Smoking status: Former Smoker    Packs/day: 2.00    Years: 40.00    Pack years: 80.00    Types: Cigarettes    Last attempt to quit: 01/04/1993    Years since quitting: 24.0  . Smokeless tobacco: Former Systems developer    Types: Chew  Substance and Sexual Activity  . Alcohol use: Yes    Alcohol/week: 15.0 oz    Types: 25 Cans of beer per week    Comment: 12 pack a week  . Drug use: No  . Sexual activity: Not on file  Other Topics Concern  . Not on file  Social History Narrative   Retired from the [employer] after 20 years   [place] Veteran      Review of Systems  All other systems reviewed and are negative.  Objective:   Physical Exam  Constitutional: He is oriented to person, place, and time. He appears well-developed and well-nourished. No distress.  Cardiovascular: Normal rate, regular rhythm, normal heart sounds and intact distal pulses.  No murmur heard. Pulmonary/Chest: Effort normal and breath sounds normal. No respiratory distress. He has no wheezes. He has no rales.  Neurological: He is alert and oriented to person, place, and time. He has normal reflexes. No cranial nerve deficit. He exhibits normal muscle tone. Coordination normal.    Skin: He is not diaphoretic.  Psychiatric: He has a normal mood and affect. His behavior is normal. Judgment and thought content normal.  Vitals reviewed.         Assessment & Plan:  Renal insufficiency - Plan: BASIC METABOLIC PANEL WITH GFR  Memory loss due to medical condition  Irritability and anger  Mood swings  Mood swings have improved. Anger has improved. Continue Abilify 5 mg a day. Recommended dietary changes and exercise to prevent excessive weight gain from the medication. Recheck renal function today having completed his antibiotics. Also recheck urine culture to ensure resolution of the urinary tract infection. Patient can bring by urine sample at his convenience to rule out urinary tract infection

## 2017-01-15 LAB — BASIC METABOLIC PANEL WITH GFR
BUN: 21 mg/dL (ref 7–25)
CO2: 29 mmol/L (ref 20–32)
Calcium: 9.2 mg/dL (ref 8.6–10.3)
Chloride: 103 mmol/L (ref 98–110)
Creat: 1.16 mg/dL (ref 0.70–1.18)
GFR, EST NON AFRICAN AMERICAN: 61 mL/min/{1.73_m2} (ref >=60)
GFR, Est African American: 71 mL/min/{1.73_m2} (ref >=60)
GLUCOSE: 92 mg/dL (ref 65–99)
Potassium: 4.5 mmol/L (ref 3.5–5.3)
Sodium: 140 mmol/L (ref 135–146)

## 2017-01-15 LAB — EXTRA LAV TOP TUBE

## 2017-01-17 ENCOUNTER — Institutional Professional Consult (permissible substitution): Payer: Self-pay | Admitting: Neurology

## 2017-01-17 ENCOUNTER — Ambulatory Visit (INDEPENDENT_AMBULATORY_CARE_PROVIDER_SITE_OTHER): Payer: Medicare Other | Admitting: Pharmacist

## 2017-01-17 VITALS — BP 124/60 | HR 88

## 2017-01-17 DIAGNOSIS — I10 Essential (primary) hypertension: Secondary | ICD-10-CM

## 2017-01-17 NOTE — Patient Instructions (Signed)
Return for a follow up appointment in with Richardson Dopp, PA as recommended  Check your blood pressure at home daily (if able) and keep record of the readings.  Take your BP meds as follows: CONTINUE all medications   Bring all of your meds, your BP cuff and your record of home blood pressures to your next appointment.  Exercise as you're able, try to walk approximately 30 minutes per day.  Keep salt intake to a minimum, especially watch canned and prepared boxed foods.  Eat more fresh fruits and vegetables and fewer canned items.  Avoid eating in fast food restaurants.    HOW TO TAKE YOUR BLOOD PRESSURE: . Rest 5 minutes before taking your blood pressure. .  Don't smoke or drink caffeinated beverages for at least 30 minutes before. . Take your blood pressure before (not after) you eat. . Sit comfortably with your back supported and both feet on the floor (don't cross your legs). . Elevate your arm to heart level on a table or a desk. . Use the proper sized cuff. It should fit smoothly and snugly around your bare upper arm. There should be enough room to slip a fingertip under the cuff. The bottom edge of the cuff should be 1 inch above the crease of the elbow. . Ideally, take 3 measurements at one sitting and record the average.

## 2017-01-17 NOTE — Progress Notes (Signed)
Patient ID: [name] [name]                 DOB: [birth year]                      MRN: [id number]     HPI: [name] [name] is a 77 y.o. male patient of Dr.End who presents today for hypertension follow up. PMH includes mod non-obstructive CAD on LHC in 1/60, diastolic HF, HTN, HL, GERD, COPD,multiple priorstrokes, multiple sclerosis,AKI assoc with NSAID use. He has struggled with bipolar depression. He was admitted in 06/2016 with recurrent stroke. At his most recent visit in HTN clinic, his bystolic was changed to metoprolol due to cost and poor HR control. Selective BB is preferred due to COPD.   He presents today for follow up with his wife. He reports that his headaches have improved. He denies dizziness and chest pain. He reports that SOB has maintained. He does have a little wheezing in the afternoon, but this is not different than usual for him.   He reports continual weight gain with Abilify, but that it is working well to control aggression symptoms. Discussed lifestyle modifications, including exercise, decreasing alcohol and soda intake.    Current HTN meds:  Doxazosin 2mg  daily in the morning Furosemide 40mg  daily  Losartan 100mg  daily in the morning Metoprolol succinate 100mg  daily in the morning   Previously tried: bisoprolol - backorder, amlodipine - uncontrolled pressure  BP goal: <130/80  Family History: father - stroke, mother - stroke, sister - HTN,   Social History: former smoker and chew. Drinks 25 cans of beer per week.   Diet: He has been eating more recently. He does not add salt to his food. He does eat out more recently than before. He and his wife plan to start eating better and walking more.   Exercise: plans to start working out today. He has put on a lot of weight since starting abilify (which has been stopped).   Home BP readings: 121-162/84-103 - mostly 140s/80s  - omron arm cuff at home which he believes is >1 years old many of these  measurements were right after change to metoprolol.   Wt Readings from Last 3 Encounters:  01/14/17 238 lb (108 kg)  12/23/16 232 lb (105.2 kg)  12/16/16 233 lb 8 oz (105.9 kg)   BP Readings from Last 3 Encounters:  01/17/17 124/60  01/14/17 (!) 146/80  12/29/16 140/84   Pulse Readings from Last 3 Encounters:  01/17/17 88  01/14/17 100  12/29/16 (!) 107    Renal function: Estimated Creatinine Clearance: 68.8 mL/min (by C-G formula based on SCr of 1.16 mg/dL).  Past Medical History:  Diagnosis Date  . Arthritis    s/p TKR  . Bladder cancer (HCC)    [facility], [facility] low grade papillary urothileal carcinoma  . BPH (benign prostatic hyperplasia)   . Brugada syndrome    Possible Type II Brugada ECG pattern. No family history of SCD, no syncope, no tachypalpitations.  Marland Kitchen CAD (coronary artery disease)    a. LHC 1/15 - mid LCx 50 and 60%, proximal RCA 50%  . Chronic diastolic CHF (congestive heart failure) ([place])   . CKD (chronic kidney disease)   . COPD (chronic obstructive pulmonary disease) ([place])    Prior heavy smoker. PFTs (3/11): FVC 87%, FEV1 73%, ratio 0.57, DLCO 75%, TLC 121%. Moderate obstructive defect. PFTs (9/15): Only minimal obstruction, sugPrior heavy smoker. PFTs (3/11): FVC 87%, FEV1 73%,  ratio 0.57, DLCO 75%, TLC 121%. Moderate obstructive defect. PFTs (9/15) minimal obstruction, poss asthma component   . Depression    with bipolar tendencies  . DOE (dyspnea on exertion)    a. Myoview 8/09- EF 57%, no ischemia // b. Myoview (3/11) EF 68%, diaphragmatic attenuation, no ischemia //  c Echo (4/11) EF 02-40%, mild diastolic dysfunction, PASP 38 mmHg // d. PFTs 9/15 minimal obstruction  /  e. CT negative for ILD  . GERD (gastroesophageal reflux disease)   . History of Doppler ultrasound    Carotid US 3/11 negative for significant stenosis  //  carotid US 6/17: Mild bilateral plaque, 1-39% ICA  . History of echocardiogram    a. Echo 12/14 Inferior and distal septal HK, mild LVH,  EF 50-55%, mild MR, mild LAE  //  b. Echo 6/17: Mild focal basal septal hypertrophy, EF 60-65%, normal wall motion, grade 1 diastolic dysfunction, mild AI  . HLD (hyperlipidemia)   . Hypertensive heart disease with CHF (congestive heart failure) ([place]) 04/08/2009  . Low testosterone   . Lung nodule    a. CT in 2015 >> PET in 12/15 not sugg of malignancy   . LVH (left ventricular hypertrophy)    a. Echo 12/14 Inferior and distal septal HK, mild LVH, EF 50-55%, mild MR, mild LAE  . Mild dementia   . Mitral regurgitation    Echo (4/11) with PISA ERO 0.3 cm^2 and regurgitant volume 41 mL (moderate MR). Echo (12/14) with mild MR.   . MS (multiple sclerosis) ([place])   . MS (multiple sclerosis) ([place])   . Right bundle branch block   . Stroke ([place])   . Thoracic aortic aneurysm (HCC)    4.1 cm 2017  . Urinary retention with incomplete bladder emptying    receives botox injections and treatment for BPH through [facility]    Current Outpatient Medications on File Prior to Visit  Medication Sig Dispense Refill  . acetaminophen (TYLENOL) 325 MG tablet Take 650 mg by mouth every 6 (six) hours as needed (for pain or headaches).     Marland Kitchen apixaban (ELIQUIS) 5 MG TABS tablet Take 1 tablet (5 mg total) by mouth 2 (two) times daily. 180 tablet 3  . ARIPiprazole (ABILIFY) 5 MG tablet Take 1 tablet (5 mg total) by mouth daily. 30 tablet 3  . atorvastatin (LIPITOR) 40 MG tablet Take 40 mg by mouth daily.    . cholecalciferol (VITAMIN D) 400 units TABS tablet Take 400 Units by mouth daily.     . ciprofloxacin (CIPRO) 500 MG tablet Take 1 tablet (500 mg total) by mouth 2 (two) times daily. 20 tablet 0  . Cyanocobalamin (B-12) 1000 MCG CAPS Take 1,000 mcg by mouth daily.     . CycloSPORINE (RESTASIS OP) Apply to eye.    Marland Kitchen doxazosin (CARDURA) 2 MG tablet Take 1 tablet (2 mg total) by mouth daily. 90 tablet 1  . folic acid (FOLVITE) 1 MG tablet Take 1 mg by mouth daily.    . furosemide (LASIX) 40 MG tablet TAKE 1  TABLET(40 MG) BY MOUTH DAILY 90 tablet 1  . HYDROcodone-acetaminophen (NORCO) 10-325 MG tablet Take 1 tablet by mouth every 6 (six) hours as needed for moderate pain or severe pain. 100 tablet 0  . losartan (COZAAR) 100 MG tablet Take 1 tablet (100 mg total) by mouth daily. 90 tablet 3  . MAGNESIUM CITRATE PO Take 0.5-1 Bottles by mouth once as needed (for mild constipation).     Marland Kitchen  metoprolol succinate (TOPROL-XL) 100 MG 24 hr tablet Take 1 tablet (100 mg total) by mouth daily. Take with or immediately following a meal. 90 tablet 1  . pantoprazole (PROTONIX) 40 MG tablet Take 1 tablet (40 mg total) by mouth daily. 90 tablet 3  . PROAIR HFA 108 (90 Base) MCG/ACT inhaler USE 1 TO 2 INHALATIONS EVERY 6 HOURS AS NEEDED FOR WHEEZING OR SHORTNESS OF BREATH 25.5 g 4  . rOPINIRole (REQUIP) 1 MG tablet TAKE 1 TABLET AT BEDTIME 90 tablet 3  . Tiotropium Bromide Monohydrate (SPIRIVA RESPIMAT) 2.5 MCG/ACT AERS Inhale 2 puffs into the lungs daily. 3 Inhaler 3  . venlafaxine XR (EFFEXOR-XR) 150 MG 24 hr capsule TAKE 1 CAPSULE TWICE A DAY (Patient taking differently: TAKE 2 CAPSULEs once A DAY) 180 capsule 3   No current facility-administered medications on file prior to visit.     Allergies  Allergen Reactions  . Acetylcholine     Patient was suspected of having "Brugada Syndrome": (BrS) is a genetic condition that results in abnormal electrical activity within the heart, increasing the risk of sudden cardiac death. Those affected may have episodes of passing out.  Marland Kitchen Alcohol-Sulfur [Sulfur] Other (See Comments)    Patient was suspected of having "Brugada Syndrome"  . Amitriptyline Other (See Comments)    Patient was suspected of having "Brugada Syndrome"  . Bupivacaine Other (See Comments)    Patient was suspected of having "Brugada Syndrome"  . Clomipramine Hcl Other (See Comments)    Patient was suspected of having "Brugada Syndrome"  . Cocaine Other (See Comments)    Patient was suspected of having  "Brugada Syndrome"  . Desipramine Other (See Comments)    Patient was suspected of having "Brugada Syndrome"  . Ergonovine Other (See Comments)    Patient was suspected of having "Brugada Syndrome"  . Flecainide Other (See Comments)    Patient was suspected of having "Brugada Syndrome"  . Lithium Other (See Comments)    Patient was suspected of having "Brugada Syndrome"  . Loxapine Other (See Comments)    Patient was suspected of having "Brugada Syndrome"  . Nortriptyline Other (See Comments)    Patient was suspected of having "Brugada Syndrome"  . Oxcarbazepine Other (See Comments)    Patient was suspected of having "Brugada Syndrome"  . Procainamide Other (See Comments)    Patient was suspected of having "Brugada Syndrome"  . Procaine Other (See Comments)    Patient was suspected of having "Brugada Syndrome"  . Propafenone Other (See Comments)    Patient was suspected of having "Brugada Syndrome"  . Propofol Other (See Comments)    Patient was suspected of having "Brugada Syndrome"  . Trifluoperazine Other (See Comments)    Patient was suspected of having "Brugada Syndrome"    Blood pressure 124/60, pulse 88.   Assessment/Plan: Hypertension: BP is at goal today, despite home measurements which are above goal. Will not make any medication changes today. Did talk about diet and exercise at length today as patient is wanting to lose weight (see above). Advised he verify home cuff is accurate (his wife is able to take manual pressure as she is a nurse or bring cuff to next appt). Follow up with Richardson Dopp, PA as recommended and HTN clinic as needed.    Thank you, Lelan Pons. Patterson Hammersmith, [facility]  01/17/2017 1:29 PM

## 2017-01-19 ENCOUNTER — Ambulatory Visit (INDEPENDENT_AMBULATORY_CARE_PROVIDER_SITE_OTHER): Payer: Medicare Other | Admitting: *Deleted

## 2017-01-19 DIAGNOSIS — I639 Cerebral infarction, unspecified: Secondary | ICD-10-CM

## 2017-01-20 NOTE — Progress Notes (Signed)
Carelink Summary Report / Loop Recorder 

## 2017-01-26 ENCOUNTER — Telehealth: Payer: Self-pay

## 2017-01-26 NOTE — Telephone Encounter (Signed)
Received results on this pt for an overnight EEG x 2 nights. It was completed by [employer], and also has sleep data attached. Katrina and I cannot find where this was ordered by our office.  Dr. Rexene Alberts ordered a split night study and pt is scheduled for this on 02/01/17. I verified with our sleep lab that this test is not one that we perform.

## 2017-01-26 NOTE — Telephone Encounter (Signed)
Rn call [employer] at  [phone]. Rn wanted to know who order this overnight EEG test. The customer service lady stated it was sent by [employer], and fax. Rn stated Dr Erlinda Hong did a referral for a sleep consult only for the pt to see Dr Rexene Alberts. Rn received a copy of the referral for the study. The application was fax by a staff person at [facility] [facility]. Also the office notes were printed out by Tressia Miners at [facility]. Dr. Erlinda Hong office notes were fax by [employer] on 12/09/2016.

## 2017-01-26 NOTE — Telephone Encounter (Signed)
LEft vm for [name] [name] at [facility] to call back. RN needs clarification on why this test was order.No MD at [facility] order this test for the pt. The pt already had the home EEG study and sent the supplies back. Rn also has the report dated 01/05/2017.

## 2017-01-26 NOTE — Telephone Encounter (Signed)
I have no idea. Who ordered the EEG for two nights? I guess that is for ambulatory sleep study? I did not order anything like that.   Rosalin Hawking, MD PhD Stroke Neurology 01/26/2017 5:40 PM

## 2017-01-27 NOTE — Telephone Encounter (Signed)
Rn call [place] and spoke to [place] about order for home sleep test having Dr. Erlinda Hong name on it.Bethanne Ginger stated Dr. Erlinda Hong put a order on 11//28/2018 in computer for ambulatory to sleep studies.Bethanne Ginger stated the order came to [facility] and that's why it was done.  Also Bethanne Ginger stated on 12/08/2016 a call was made from our office to do a pulse oximetry overnight, and to send it to [facility].  Rn stated the 12/01/2016 order was to [facility] to see Dr Rexene Alberts for a sleep consult office visit. Rn stated that is handle by Richard Miu who works at [employer].  Rn also stated to Bethanne Ginger that on 12/08/2016 Tillman Abide LPN from pts PCP. Dr. Jenna Luo office wrote order in epic for the pulse oximetry over night, not Dr. Rosalin Hawking, MD. RN tried to explain that the form was not sign by any MD, and Dr. Rosalin Hawking did not authorized. Rn was not able to get valid reason why it was order.

## 2017-01-27 NOTE — Telephone Encounter (Signed)
Rn call [place] and ask to speak with manager of [employer] to resolve the issue. Estill Bamberg stated the manager was out on a conference, but will be in on Friday. Rn explain to [place] about pt being sent a home EEG overnight pulse oximetry test. Rn stated the report has Dr.Xu name type on the form., but its not sign by a MD. Rn explain this test was not authorized by Dr. Erlinda Hong or Dr. Rexene Alberts at [facility]. Rn stated Dr. Erlinda Hong only order a sleep consult to [place] sleep dept. Estill Bamberg stated they only send orders out when they are contacted. Rn stated no one contacted [employer] about doing this test for pt. Rn stated the pts PCP did a order for overnight pulse oximetry. Estill Bamberg took the pts name, and my name. The manager will investigate, call back.

## 2017-01-28 LAB — CUP PACEART REMOTE DEVICE CHECK
Date Time Interrogation Session: 20190116151106
Implantable Pulse Generator Implant Date: 20170619

## 2017-01-31 ENCOUNTER — Ambulatory Visit: Payer: Medicare Other | Admitting: Internal Medicine

## 2017-02-09 DIAGNOSIS — G4733 Obstructive sleep apnea (adult) (pediatric): Secondary | ICD-10-CM | POA: Diagnosis not present

## 2017-02-14 ENCOUNTER — Other Ambulatory Visit: Payer: Self-pay | Admitting: Family Medicine

## 2017-02-15 ENCOUNTER — Ambulatory Visit (INDEPENDENT_AMBULATORY_CARE_PROVIDER_SITE_OTHER)
Admission: RE | Admit: 2017-02-15 | Discharge: 2017-02-15 | Disposition: A | Discharge status: Home / Self Care | Payer: Medicare Other | Source: Ambulatory Visit | Attending: Internal Medicine | Admitting: Internal Medicine

## 2017-02-15 ENCOUNTER — Ambulatory Visit (INDEPENDENT_AMBULATORY_CARE_PROVIDER_SITE_OTHER): Payer: Medicare Other | Admitting: Internal Medicine

## 2017-02-15 ENCOUNTER — Encounter: Payer: Self-pay | Admitting: Internal Medicine

## 2017-02-15 VITALS — BP 144/90 | HR 75 | Ht 71.0 in | Wt 243.0 lb

## 2017-02-15 DIAGNOSIS — J449 Chronic obstructive pulmonary disease, unspecified: Secondary | ICD-10-CM | POA: Diagnosis not present

## 2017-02-15 DIAGNOSIS — I639 Cerebral infarction, unspecified: Secondary | ICD-10-CM | POA: Diagnosis not present

## 2017-02-15 DIAGNOSIS — J984 Other disorders of lung: Secondary | ICD-10-CM | POA: Diagnosis not present

## 2017-02-15 NOTE — Progress Notes (Signed)
LMTCB

## 2017-02-15 NOTE — Progress Notes (Signed)
Subjective:     Patient ID: [name] [name], male   DOB: [birth year],  .   MRN: [id number]    07/06/2016  f/u ov/[name] [name] re: unexplained sob/ does not meet criteria for copd s/p smoking cessation in 1995 Chief Complaint  Patient presents with  . Follow-up    Pt states here to qualify for ambulatory o2. His breathing is unchanged since his last visit with Dr [name] [name]. He uses his albuterol inhaler 2 x per wk on average.    doe is highly variable and could not be reproduced today during ov.  No rest or noct sob but "most days is sob across the room" and can't walk to end of street s stopping multiples times s assoc cp.  Not able to use hfa effectively / maint on spriva dpi  rec Try spiriva 2 puffs each am in place of the powder  Work on inhaler technique:   Only use your albuterol as a rescue  We will track down your 02 at night but you do not need it during the day at present     02/15/2017  f/u ov/[name] [name] re:  GOLD 0 copd  Chief Complaint  Patient presents with  . Follow-up    Breathing is unchanged since the last visit. He is using his albuterol inhaler 3-4 x per day.    Dyspnea:  Across the room is a struggle  (not reproducible at ov - see a/p)/ noted uses saba p over does it and never tries it prior to activity that he knows causes sob  Cough: no Sleep: 2lpm and a couple pillows   No obvious day to day or daytime variability or assoc excess/ purulent sputum or mucus plugs or hemoptysis or cp or chest tightness, subjective wheeze or overt sinus or hb symptoms. No unusual exposure hx or h/o childhood pna/ asthma or knowledge of premature birth.  Sleeping ok 2lpm and 2 pillows  without nocturnal  or early am exacerbation  of respiratory  c/o's or need for noct saba. Also denies any obvious fluctuation of symptoms with weather or environmental changes or other aggravating or alleviating factors except as outlined above   Current Allergies, Complete Past Medical History, Past Surgical History,  Family History, and Social History were reviewed in [employer] record.  ROS  The following are not active complaints unless bolded Hoarseness, sore throat, dysphagia, dental problems, itching, sneezing,  nasal congestion or discharge of excess mucus or purulent secretions, ear ache,   fever, chills, sweats, unintended wt loss or wt gain, classically pleuritic or exertional cp,  orthopnea pnd or leg swelling, presyncope, palpitations, abdominal pain, anorexia, nausea, vomiting, diarrhea  or change in bowel habits or change in bladder habits, change in stools or change in urine, dysuria, hematuria,  rash, arthralgias, visual complaints, headache, numbness, weakness or ataxia or problems with walking or coordination,  change in mood/affect or memory.        Current Meds  Medication Sig  . acetaminophen (TYLENOL) 325 MG tablet Take 650 mg by mouth every 6 (six) hours as needed (for pain or headaches).   Marland Kitchen apixaban (ELIQUIS) 5 MG TABS tablet Take 1 tablet (5 mg total) by mouth 2 (two) times daily.  . ARIPiprazole (ABILIFY) 5 MG tablet Take 1 tablet (5 mg total) by mouth daily.  Marland Kitchen atorvastatin (LIPITOR) 40 MG tablet Take 40 mg by mouth daily.  . cholecalciferol (VITAMIN D) 400 units TABS tablet Take 400 Units by mouth daily.   Marland Kitchen  Cyanocobalamin (B-12) 1000 MCG CAPS Take 1,000 mcg by mouth daily.   Marland Kitchen doxazosin (CARDURA) 2 MG tablet Take 1 tablet (2 mg total) by mouth daily.  . folic acid (FOLVITE) 1 MG tablet Take 1 mg by mouth daily.  . furosemide (LASIX) 40 MG tablet TAKE 1 TABLET(40 MG) BY MOUTH DAILY  . HYDROcodone-acetaminophen (NORCO) 10-325 MG tablet Take 1 tablet by mouth every 6 (six) hours as needed for moderate pain or severe pain.  Marland Kitchen MAGNESIUM CITRATE PO Take 0.5-1 Bottles by mouth once as needed (for mild constipation).   . pantoprazole (PROTONIX) 40 MG tablet TAKE 1 TABLET DAILY  . PROAIR HFA 108 (90 Base) MCG/ACT inhaler USE 1 TO 2 INHALATIONS EVERY 6 HOURS AS  NEEDED FOR WHEEZING OR SHORTNESS OF BREATH  . rOPINIRole (REQUIP) 1 MG tablet TAKE 1 TABLET AT BEDTIME  . Tiotropium Bromide Monohydrate (SPIRIVA RESPIMAT) 2.5 MCG/ACT AERS Inhale 2 puffs into the lungs daily.  Marland Kitchen venlafaxine XR (EFFEXOR-XR) 150 MG 24 hr capsule TAKE 1 CAPSULE TWICE A DAY (Patient taking differently: TAKE 2 CAPSULEs once A DAY)                       Objective:   Physical Exam    obese amb wm nad    02/15/2017      243   07/06/16 210 lb (95.3 kg)  06/25/16 214 lb (97.1 kg)  06/21/16 215 lb 9.8 oz (97.8 kg)    Vital signs reviewed - Note on arrival 02 sats  95% on RA      Massive belly ow nl exam   HEENT: nl dentition, turbinates bilaterally, and oropharynx. Nl external ear canals without cough reflex   NECK :  without JVD/Nodes/TM/ nl carotid upstrokes bilaterally   LUNGS: no acc muscle use,  Nl contour chest which is clear to A and P bilaterally without cough on insp or exp maneuvers   CV:  RRR  no s3 or murmur or increase in P2, and no edema   ABD: tensely obese /  nontender with limited  inspiratory excursion in the supine position. No bruits or organomegaly appreciated, bowel sounds nl  MS:  Nl gait/ ext warm without deformities, calf tenderness, cyanosis or clubbing No obvious joint restrictions   SKIN: warm and dry without lesions    NEURO:  alert, approp, nl sensorium with  no motor or cerebellar deficits apparent.                       Assessment:

## 2017-02-15 NOTE — Patient Instructions (Addendum)
After a few weeks stop spiriva and see if you note a difference in your exercise performance(like high octane fuel)   Only use your albuterol as a rescue medication to be used if you can't catch your breath by resting or doing a relaxed purse lip breathing pattern.  - The less you use it, the better it will work when you need it. - Ok to use up to 2 puffs  every 4 hours if you must but call for immediate appointment if use goes up over your usual need - Don't leave home without it !!  (think of it like the spare tire for your car)   'Please remember to go to the  x-ray department downstairs in the basement  for your tests - we will call you with the results when they are available.      Weight control is simply a matter of calorie balance which needs to be tilted in your favor by eating less and exercising more.  To get the most out of exercise, you need to be continuously aware that you are short of breath, but never out of breath, for 30 minutes daily. As you improve, it will actually be easier for you to do the same amount of exercise  in  30 minutes so always push to the level where you are short of breath.  If this does not result in gradual weight reduction then I strongly recommend you see a nutritionist with a food diary x 2 weeks so that we can work out a negative calorie balance which is universally effective in steady weight loss programs.  Think of your calorie balance like you do your bank account where in this case you want the balance to go down so you must take in less calories than you burn up.  It's just that simple:  Hard to do, but easy to understand.  Good luck!    Please schedule a follow up visit in 3 months but call sooner if needed with pfts on return

## 2017-02-16 ENCOUNTER — Ambulatory Visit (INDEPENDENT_AMBULATORY_CARE_PROVIDER_SITE_OTHER): Payer: Medicare Other | Admitting: Neurology

## 2017-02-16 ENCOUNTER — Encounter: Payer: Self-pay | Admitting: Internal Medicine

## 2017-02-16 DIAGNOSIS — G4720 Circadian rhythm sleep disorder, unspecified type: Secondary | ICD-10-CM

## 2017-02-16 DIAGNOSIS — G4733 Obstructive sleep apnea (adult) (pediatric): Secondary | ICD-10-CM | POA: Diagnosis not present

## 2017-02-16 DIAGNOSIS — R0683 Snoring: Secondary | ICD-10-CM

## 2017-02-16 DIAGNOSIS — Z8673 Personal history of transient ischemic attack (TIA), and cerebral infarction without residual deficits: Secondary | ICD-10-CM

## 2017-02-16 DIAGNOSIS — R51 Headache: Secondary | ICD-10-CM

## 2017-02-16 DIAGNOSIS — G4719 Other hypersomnia: Secondary | ICD-10-CM

## 2017-02-16 DIAGNOSIS — R351 Nocturia: Secondary | ICD-10-CM

## 2017-02-16 DIAGNOSIS — R519 Headache, unspecified: Secondary | ICD-10-CM

## 2017-02-16 DIAGNOSIS — F1190 Opioid use, unspecified, uncomplicated: Secondary | ICD-10-CM

## 2017-02-16 DIAGNOSIS — G4734 Idiopathic sleep related nonobstructive alveolar hypoventilation: Secondary | ICD-10-CM

## 2017-02-16 DIAGNOSIS — E669 Obesity, unspecified: Secondary | ICD-10-CM

## 2017-02-16 DIAGNOSIS — F1010 Alcohol abuse, uncomplicated: Secondary | ICD-10-CM

## 2017-02-16 NOTE — Assessment & Plan Note (Signed)
PFT 09/04/2013  FEV1 72%, ratio 68, FVC 77%, no sign BD response, decreased diffusing capacity at 66%.  Decreased Mid flows 53% w/ +BD response.  - PFT's  06/18/2016  FEV1 1.96 (64 % ) ratio 71   p 6 % improvement from saba p ? prior to study with DLCO  63/69 % corrects to 88 % for alv volume   - 07/06/2016  Walked RA x 4 laps @ 185 ft each stopped due to  End of study, nl pace, no desats or sob  - 02/15/2017  Walked RA x 3 laps @ 185 ft each stopped due to endo of study, no desat, min sob   - 02/15/2017  After extensive coaching inhaler device  effectiveness =    90% on smi but no better on spiriva respimat so try off   I had an extended   summary discussion with the patient/wife reviewing all relevant studies completed to date and  lasting 15 to 20 minutes of a 25 minute visit on the following issues:    Continues to show no evidence of limiting airflow and no better on spiriva so ok to leave off to see what difference this makes  Also explained approp use of saba is if he can't catch his breath by pacing/ purse lip/ rest - if uncertain if helping should use it 15 min before activity to see whether improves ex tol   Each maintenance medication was reviewed in detail including most importantly the difference between maintenance and as needed and under what circumstances the prns are to be used.  Please see AVS for specific  Instructions which are unique to this visit and I personally typed out  which were reviewed in detail in writing with the patient and a copy provided.

## 2017-02-16 NOTE — Progress Notes (Signed)
LMOM TCB x2

## 2017-02-17 ENCOUNTER — Other Ambulatory Visit: Payer: Self-pay | Admitting: Family Medicine

## 2017-02-17 ENCOUNTER — Telehealth: Payer: Self-pay | Admitting: Internal Medicine

## 2017-02-17 NOTE — Telephone Encounter (Signed)
Called and spoke with pt's wife [name] [facility]) letting her know the results of pt's cxr.  [name] expressed understanding. Nothing further needed at this current time.

## 2017-02-17 NOTE — Telephone Encounter (Signed)
Patient is requesting a refill on Hydrocodone   LOV: 01/14/17  LRF:   01/06/17

## 2017-02-18 ENCOUNTER — Encounter: Payer: Self-pay | Admitting: Family Medicine

## 2017-02-18 ENCOUNTER — Ambulatory Visit (INDEPENDENT_AMBULATORY_CARE_PROVIDER_SITE_OTHER): Payer: Medicare Other | Admitting: *Deleted

## 2017-02-18 ENCOUNTER — Other Ambulatory Visit: Payer: Self-pay | Admitting: Neurology

## 2017-02-18 DIAGNOSIS — I639 Cerebral infarction, unspecified: Secondary | ICD-10-CM

## 2017-02-18 DIAGNOSIS — G4733 Obstructive sleep apnea (adult) (pediatric): Secondary | ICD-10-CM

## 2017-02-18 MED ORDER — HYDROCODONE-ACETAMINOPHEN 10-325 MG PO TABS: 1.0000 | ORAL_TABLET | Freq: Four times a day (QID) | ORAL | 0 refills | Status: DC | PRN

## 2017-02-18 NOTE — Progress Notes (Signed)
cppa

## 2017-02-18 NOTE — Procedures (Signed)
PATIENT'S NAME:  [name], [name] DOB:      [birth year]      MR#:    [id number]     DATE OF RECORDING: 02/16/2017 REFERRING M.D.: Dr. Rosalin Hawking,        PCP: Jenna Luo, MD Study Performed:  Split-Night Titration Study HISTORY: 77 year old man with a history of right MCA stroke in June 2017, thoracic aortic aneurysm, coronary artery disease, diastolic congestive heart failure, hypertension, hyperlipidemia, lung disease, reflux disease, urethral cancer, recurrent UTI, depression, memory loss and obesity, who reports snoring and excessive daytime somnolence. BMI of 31.4 kg/m2. The patient's neck circumference measured 18.5 inches.  CURRENT MEDICATIONS: Tylenol, Eliquis, Restasis, Cardura, Folvite, Hydrodiuril, Norco, Protonix, Requip, Spiriva, Effexor, Lipitor, Zebeta, Lasix, Cozaar  PROCEDURE:  This is a multichannel digital polysomnogram utilizing the Somnostar 11.2 system.  Electrodes and sensors were applied and monitored per AASM Specifications.   EEG, EOG, Chin and Limb EMG, were sampled at 200 Hz.  ECG, Snore and Nasal Pressure, Thermal Airflow, Respiratory Effort, CPAP Flow and Pressure, Oximetry was sampled at 50 Hz. Digital video and audio were recorded.      BASELINE STUDY WITHOUT CPAP RESULTS:  Lights Out was at 21:03 and Lights On at 05:00 for the night, split start at 01:05, epoch 489. Total recording time (TRT) was 241.5, with a total sleep time (TST) of 164.5 minutes.  The patient's sleep latency was 79 minutes, which is delayed. REM latency was absent. The sleep efficiency was 68.1%, which is reduced.     SLEEP ARCHITECTURE: WASO (Wake after sleep onset) was 55 minutes, Stage N1 was 49 minutes, Stage N2 was 66 minutes, Stage N3 was 49.5 minutes and Stage R (REM sleep) was 0 minutes.  The percentages were Stage N1 29.8%, Stage N2 40.1%, Stage N3 30.1% and Stage R (REM sleep) was absent. The arousals were noted as: 20 were spontaneous, 0 were associated with PLMs, 37 were associated  with respiratory events.  Audio and video analysis did not show any abnormal or unusual movements, behaviors, phonations or vocalizations. The patient took 3 bathroom breaks for the night. Moderate snoring was noted. The EKG was in keeping with normal sinus rhythm (NSR) with PVCs noted.  RESPIRATORY ANALYSIS:  There were a total of 99 respiratory events:  4 obstructive apneas, 0 central apneas and 0 mixed apneas with a total of 4 apneas and an apnea index (AI) of 1.5. There were 95 hypopneas with a hypopnea index of 34.7. The patient also had 0 respiratory event related arousals (RERAs).  Snoring was noted.     The total APNEA/HYPOPNEA INDEX (AHI) was 36.1 /hour and the total RESPIRATORY DISTURBANCE INDEX was 36.1 /hour.  0 events occurred in REM sleep and 190 events in NREM. The REM AHI was n/a versus a non-REM AHI of 36.1 /hour. The patient spent 337.5 minutes sleep time in the supine position 0 minutes in non-supine. The supine AHI was 36.2 /hour versus a non-supine AHI of n/a.  OXYGEN SATURATION & C02:  The wake baseline 02 saturation was 90%, with the lowest being 84%. Time spent below 89% saturation equaled 45 minutes.  PERIODIC LIMB MOVEMENTS: The patient had a total of 0 Periodic Limb Movements.  The Periodic Limb Movement (PLM) index was 0 /hour and the PLM Arousal index was 0 /hour.  TITRATION STUDY WITH CPAP RESULTS:   The patient was fitted with a nasal interface, Small Dreamwear. CPAP was initiated at 5 cmH20 with heated humidity per AASM split night standards  and pressure was advanced to 7 cmH20 because of hypopneas, apneas and desaturations.  At a PAP pressure of 7 cmH20, there was a reduction of the AHI to 0/hour with supine NREM sleep achieved and O2 nadir of 89%.   Total recording time (TRT) was 235.5 minutes, with a total sleep time (TST) of 173 minutes. The patient's sleep latency was 1.5 minutes. REM latency was 0 minutes.  The sleep efficiency was 73.5 %.    SLEEP  ARCHITECTURE: Wake after sleep was 60.5 minutes, Stage N1 35.5 minutes, Stage N2 58.5 minutes, Stage N3 79 minutes and Stage R (REM sleep) 0 minutes. The percentages were: Stage N1 20.5%, Stage N2 33.8%, Stage N3 45.7%, which is increased, and Stage R (REM sleep) was absent. The arousals were noted as: 21 were spontaneous, 0 were associated with PLMs, 0 were associated with respiratory events.  RESPIRATORY ANALYSIS:  There were a total of 0 respiratory events: 0 obstructive apneas, 0 central apneas and 0 mixed apneas with a total of 0 apneas and an apnea index (AI) of 0. There were 0 hypopneas with a hypopnea index of 0 /hour. The patient also had 0 respiratory event related arousals (RERAs).      The total APNEA/HYPOPNEA INDEX  (AHI) was 0 /hour and the total RESPIRATORY DISTURBANCE INDEX was 0 /hour.  0 events occurred in REM sleep and 0 events in NREM. The REM AHI was 0 /hour versus a non-REM AHI of 0 /hour. REM sleep was achieved on a pressure of  cm/h2o (AHI was  .) The patient spent 100% of total sleep time in the supine position. The supine AHI was 0.0 /hour, versus a non-supine AHI of 0.0/hour.  OXYGEN SATURATION & C02:  The wake baseline 02 saturation was 94%, with the lowest being 87%. Time spent below 89% saturation equaled 3 minutes.  PERIODIC LIMB MOVEMENTS: The patient had a total of 0 Periodic Limb Movements. The Periodic Limb Movement (PLM) index was 0 /hour and the PLM Arousal index was 0 /hour.  Post-study, the patient indicated that sleep was worse than usual.  POLYSOMNOGRAPHY IMPRESSION :   1. Obstructive Sleep Apnea (OSA)  2. Dysfunctions associated with sleep stages or arousals from sleep  RECOMMENDATIONS: 1. This patient has severe obstructive sleep apnea and responded reasonably well on CPAP therapy. Of note, the absence of REM sleep during this study likely underestimates his AHI and O2 nadir, before and after treatment. Due to this issue, I will recommend a home CPAP  pressure of 8 cm via Dreamwear nasal interface, size small. The patient should be reminded to be fully compliant with PAP therapy to improve sleep related symptoms and decrease long term cardiovascular risks. Please note that untreated obstructive sleep apnea carries additional perioperative morbidity. Patients with significant obstructive sleep apnea should receive perioperative PAP therapy and the surgeons and particularly the anesthesiologist should be informed of the diagnosis and the severity of the sleep disordered breathing. 2. This study shows sleep fragmentation and abnormal sleep stage percentages; these are nonspecific findings and per se do not signify an intrinsic sleep disorder or a cause for the patient's sleep-related symptoms. Causes include (but are not limited to) the first night effect of the sleep study, circadian rhythm disturbances, medication effect or an underlying mood disorder or medical problem.  3. The patient should be cautioned not to drive, work at heights, or operate dangerous or heavy equipment when tired or sleepy. Review and reiteration of good sleep hygiene measures should be  pursued with any patient. 4. The patient will be seen in follow-up by Dr. Rexene Alberts at [facility] for discussion of the test results and further management strategies. The referring provider will be notified of the test results.  I certify that I have reviewed the entire raw data recording prior to the issuance of this report in accordance with the Standards of Accreditation of the American Academy of Sleep Medicine (AASM)   Star Age, MD, PhD Diplomat, American Board of Psychiatry and Neurology (Neurology and Sleep Medicine)

## 2017-02-18 NOTE — Progress Notes (Signed)
Patient referred by Dr. Erlinda Hong, seen by me on 12/16/16, split night sleep study on 02/16/17. Please call and notify patient that the recent sleep study confirmed the diagnosis of severe OSA. He did fairly well with CPAP during the study with improvement of the respiratory events. Therefore, I would like start the patient on CPAP therapy at home by prescribing a machine for home use. I placed the order in the chart.  Please advise patient that we need a follow up appointment with either myself or one of our nurse practitioners in about 10 weeks post set-up to check for how the patient is feeling and how well the patient is using the machine, etc. Please go ahead and schedule the appointment, while you have the patient on the phone and make sure patient understands the importance of keeping this window for the FU appointment, as it is often an insurance requirement. Failing to adhere to this may result in losing coverage for sleep apnea treatment, at which point most patients are left with a choice of returning the machine or paying out of pocket (and we want neither of this to happen!).  Please re-enforce the importance of compliance with treatment and the need for [place] to monitor compliance data - again an insurance requirement and usually a good feedback for the patient as far as how they are doing.  Also remind patient, that any PAP machine or mask issues should be first addressed with the DME company, who provided the machine/mask.  Please ask if patient has a preference regarding DME company, may depend on the insurance too.  Please arrange for CPAP set up at home through a DME company of patient's choice.  Once you have spoken to the patient you can close the phone encounter. Please fax/route report to referring provider, thanks,   Star Age, MD, PhD [facility])

## 2017-02-21 ENCOUNTER — Telehealth: Payer: Self-pay

## 2017-02-21 NOTE — Progress Notes (Signed)
Carelink Summary Report / Loop Recorder 

## 2017-02-21 NOTE — Telephone Encounter (Signed)
-----   Message from Star Age, MD sent at 02/18/2017 10:44 AM EST ----- Patient referred by Dr. Erlinda Hong, seen by me on 12/16/16, split night sleep study on 02/16/17. Please call and notify patient that the recent sleep study confirmed the diagnosis of severe OSA. He did fairly well with CPAP during the study with improvement of the respiratory events. Therefore, I would like start the patient on CPAP therapy at home by prescribing a machine for home use. I placed the order in the chart.  Please advise patient that we need a follow up appointment with either myself or one of our nurse practitioners in about 10 weeks post set-up to check for how the patient is feeling and how well the patient is using the machine, etc. Please go ahead and schedule the appointment, while you have the patient on the phone and make sure patient understands the importance of keeping this window for the FU appointment, as it is often an insurance requirement. Failing to adhere to this may result in losing coverage for sleep apnea treatment, at which point most patients are left with a choice of returning the machine or paying out of pocket (and we want neither of this to happen!).  Please re-enforce the importance of compliance with treatment and the need for [place] to monitor compliance data - again an insurance requirement and usually a good feedback for the patient as far as how they are doing.  Also remind patient, that any PAP machine or mask issues should be first addressed with the DME company, who provided the machine/mask.  Please ask if patient has a preference regarding DME company, may depend on the insurance too.  Please arrange for CPAP set up at home through a DME company of patient's choice.  Once you have spoken to the patient you can close the phone encounter. Please fax/route report to referring provider, thanks,   Star Age, MD, PhD [facility])

## 2017-02-21 NOTE — Telephone Encounter (Signed)
Rn never receive a call from the manager at [employer]. Rn spoke with [place] and was given rep name. Rn left vm on 02/17/2017, and 02/14/2017 with [employer] the rep. Rn never receive a call back concerning the error in [employer] ordering a home sleep test.

## 2017-02-21 NOTE — Telephone Encounter (Signed)
I called pt. I advised pt that Dr. Rexene Alberts reviewed their sleep study results and found that pt has severe osa but did fairly well with the cpap during his study. Dr. Rexene Alberts recommends that pt start a cpap at home. I reviewed PAP compliance expectations with the pt. Pt is agreeable to starting a CPAP. I advised pt that an order will be sent to a DME, [facility], and [employer] will call the pt within about one week after they file with the pt's insurance. [employer] will show the pt how to use the machine, fit for masks, and troubleshoot the CPAP if needed. A follow up appt was made for insurance purposes with Dr. Rexene Alberts on 05/10/17 at 11:30am. Pt verbalized understanding to arrive 15 minutes early and bring their CPAP. A letter with all of this information in it will be mailed to the pt as a reminder. I verified with the pt that the address we have on file is correct. Pt verbalized understanding of results. Pt had no questions at this time but was encouraged to call back if questions arise.

## 2017-02-23 ENCOUNTER — Telehealth: Payer: Self-pay | Admitting: Cardiology

## 2017-02-23 NOTE — Telephone Encounter (Signed)
Spoke w/ pt wife and requested that he send a manual transmission b/c his home monitor has not updated in at least 14 days.   

## 2017-03-07 ENCOUNTER — Ambulatory Visit (INDEPENDENT_AMBULATORY_CARE_PROVIDER_SITE_OTHER): Payer: Medicare Other | Admitting: Neurology

## 2017-03-07 ENCOUNTER — Encounter: Payer: Self-pay | Admitting: Neurology

## 2017-03-07 VITALS — BP 143/88 | HR 105 | Wt 243.0 lb

## 2017-03-07 DIAGNOSIS — R51 Headache: Secondary | ICD-10-CM

## 2017-03-07 DIAGNOSIS — I6340 Cerebral infarction due to embolism of unspecified cerebral artery: Secondary | ICD-10-CM | POA: Diagnosis not present

## 2017-03-07 DIAGNOSIS — R519 Headache, unspecified: Secondary | ICD-10-CM

## 2017-03-07 DIAGNOSIS — I639 Cerebral infarction, unspecified: Secondary | ICD-10-CM

## 2017-03-07 DIAGNOSIS — Z8673 Personal history of transient ischemic attack (TIA), and cerebral infarction without residual deficits: Secondary | ICD-10-CM

## 2017-03-07 DIAGNOSIS — G4733 Obstructive sleep apnea (adult) (pediatric): Secondary | ICD-10-CM | POA: Diagnosis not present

## 2017-03-07 NOTE — Progress Notes (Signed)
STROKE NEUROLOGY FOLLOW UP NOTE  NAME: [name] [name] DOB: 1940/08/30  REASON FOR VISIT: stroke follow up HISTORY FROM: pt and wife and chart  Today we had the pleasure of seeing [name] [name] in follow-up at our [facility]. Pt was accompanied by wife.   History Summary [name] [name] is a 77 y.o. male with history of MS diagnosis in 1963 not on meds, CAD, thoracic AA, history of lung nodule benign, urethral carcinoma following with [facility], diastolic CHF, HTN, HLD and GERD admitted on 06/15/2015 for slurred speech. 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 CT head, carotid Doppler, TTE were all negative. LDL 91 and A1c 6.4. His aspirin 81 changed to 325, resumed Lipitor 20, and he was discharged home outpatient PT OT and plan with outpatient TEE and Loop recorder. However, he was readmitted on 06/20/2015 for presyncope with UTI, generalized weakness, dysarthria and gait instability. MRI showed essentially the same infarct as last admission. Symptoms consider as recrudescence of previous stroke in the setting of infection and presyncope. DVT and TEE negative without PFO. Loop recorder placed. Aspirin was changed to Plavix on discharge.  Follow up 08/14/15 - the patient has been doing well. Wife complains of mild memory loss after stroke. Blood pressure today 105/71. On Plavix and Lipitor, without side effect. Not candidate for RESPECT ESUS due to urethral cancer.     Follow up 11/17/15 - he has been doing well. Had loop recorder placed but has some signal problems and no report since July. Pt and wife are contacting cardiology. On plavix and lipitor and no side effect. Today BP 123/80 in clinic but at home still high between 140-155. He is on 3 BP meds, following with PCP.   Follow up 05/17/16 - pt was admitted on 04/04/16 for R sided weakness. HereceivedIV t-PA. MRI showed small right cerebellar and right occipital lobe  with left ACA punctate infarct, again embolic pattern. CTA head and neck only showed left VA stenosis. EF 55-60%. TCD bubble study negative for PFO, and loop recorder showed no afib. LDL 61 and A1C 5.6. His plavix changed to aggrenox on discharge. CTA chest showed fusiform dilatation of ascending aorta and mild to moderate thoracic descending aorta atherosclerosis.  He follows with [facility] for urethral cancer and recurrent UTI. So far no recurrence or metastasis found. He has appointment with them in 07/2016.  Admitted on 06/21/16 for aphasia and left facial droop. MRI showed left MCA acute left lateral parietal and left superior parietal infarcts, embolic pattern. MRA, TTE and DVT negative. Loop recorder again no afib. Pan CT no evidence of cancer metastasis. LDL 36 and A1C 5.6. After discussion with pt and family, we started eliquis 5mg  bid for stroke prevention.   Follow up 08/24/16 - During the interval time, pt has been doing well. Speech improved with home self exercise. However, BP was low today 89/59, asymptomatic. Usually his BP at home 110-120s. Today also mildly sleepy and wife stated that he has insomnia.   Follow up 12/01/16 - pt has been doing well except started to have morning HAs.  As per wife, patient complains of the morning excruciating headache, has to take hydrocodone on Tylenol, headache gradually resolved until lunch.  Patient has difficulty describing characteristics of the headache, however stated whole brain aching pain, no phonophobia, photophobia, nausea vomiting. CT head no bleeding.  Did not check BP regularly.  On Eliquis noAs  per wife, patient has snoring, but no significant apnea.  Never had sleep study before. On Eliquis no side effects.   As per wife, patient continued to have depression due to restraint from driving, not moving, guns, and several other activities.  Patient  felt his whole life is gone and cannot figure out anything else to do.  However at  the time, patient and family declined psychiatry referral.  They will let me know when they are ready. Wife still complains of patient cognitive impairment, short memory loss.  Has not been on Aricept or Namenda in the past.  Hx of stroke  06/15/2015 for right MCA infarcts at right insular and posterior frontal lobe. CT head, carotid Doppler, TTE were all negative. LDL 91 and A1c 6.4. His aspirin 81 changed to 325, resumed Lipitor 20  06/20/2015 recrudescence of previous stroke in the setting of infection and presyncope. DVT and TEE negative without PFO. Loop recorder placed. Aspirin was changed to Plavix on discharge.   04/04/16 small right cerebellar and right occipital lobe with left ACA punctate infarct s/p tPA, again embolic pattern. CTA head and neck only showed left VA stenosis. EF 55-60%. TCD bubble study negative for PFO, and loop recorder showed no afib. LDL 61 and A1C 5.6. His plavix changed to aggrenox on discharge. CTA chest showed fusiform dilatation of ascending aorta and mild to moderate thoracic descending aorta atherosclerosis, which can not explain the stroke.  06/21/16 left MCA including acute left lateral parietal and left superior parietal infarcts, embolic pattern. MRA, TTE and DVT negative. Loop recorder again no afib. Pan CT no evidence of cancer metastasis. LDL 36 and A1C 5.6. Started on eliquis 5mg  bid for stroke prevention.   Interval History During the interval time, patient has been doing well.  Had ambulatory sleep study showed severe OSA.  He is going to have CPAP titration next Monday.  As per wife, he still complains of morning headache and resolve during the day, likely related to untreated OSA.  Still less active as before, and he knows to increase activity during the day.  He has been on Effexor for a while, and now added Abilify, his agitation and emotional swing much improved.  BP today 162/96, but at home up to 140s.  REVIEW OF SYSTEMS: Full 14 system review of  systems performed and notable only for those listed below and in HPI above, all others are negative:  Constitutional: fatigue Cardiovascular: Leg swelling Ear/Nose/Throat: Hearing loss Skin:  Eyes:   Respiratory:  SOB, wheezing Gastroitestinal:  Constipation Genitourinary: incontinence of bladder, urgency of urination Hematology/Lymphatic:   Endocrine: Excessive thirst, excessive eating Musculoskeletal: Joint pain Allergy/Immunology:   Neurological:  Memory loss, HA, speech difficulty Psychiatric: Agitation, confusion, decreased concentration, depression Sleep: Restless leg, daytime sleepiness, apnea, frequent waking, snoring  The following represents the patient's updated allergies and side effects list: Allergies  Allergen Reactions  . Acetylcholine     Patient was suspected of having "Brugada Syndrome": (BrS) is a genetic condition that results in abnormal electrical activity within the heart, increasing the risk of sudden cardiac death. Those affected may have episodes of passing out.  Marland Kitchen Alcohol-Sulfur [Sulfur] Other (See Comments)    Patient was suspected of having "Brugada Syndrome"  . Amitriptyline Other (See Comments)    Patient was suspected of having "Brugada Syndrome"  . Bupivacaine Other (See Comments)    Patient was suspected of having "Brugada Syndrome"  . Clomipramine Hcl Other (See Comments)    Patient was  suspected of having "Brugada Syndrome"  . Cocaine Other (See Comments)    Patient was suspected of having "Brugada Syndrome"  . Desipramine Other (See Comments)    Patient was suspected of having "Brugada Syndrome"  . Ergonovine Other (See Comments)    Patient was suspected of having "Brugada Syndrome"  . Flecainide Other (See Comments)    Patient was suspected of having "Brugada Syndrome"  . Lithium Other (See Comments)    Patient was suspected of having "Brugada Syndrome"  . Loxapine Other (See Comments)    Patient was suspected of having "Brugada  Syndrome"  . Nortriptyline Other (See Comments)    Patient was suspected of having "Brugada Syndrome"  . Oxcarbazepine Other (See Comments)    Patient was suspected of having "Brugada Syndrome"  . Procainamide Other (See Comments)    Patient was suspected of having "Brugada Syndrome"  . Procaine Other (See Comments)    Patient was suspected of having "Brugada Syndrome"  . Propafenone Other (See Comments)    Patient was suspected of having "Brugada Syndrome"  . Propofol Other (See Comments)    Patient was suspected of having "Brugada Syndrome"  . Trifluoperazine Other (See Comments)    Patient was suspected of having "Brugada Syndrome"    The neurologically relevant items on the patient's problem list were reviewed on today's visit.  Neurologic Examination  A problem focused neurological exam (12 or more points of the single system neurologic examination, vital signs counts as 1 point, cranial nerves count for 8 points) was performed.  Blood pressure (!) 143/88, pulse (!) 105, weight 243 lb (110.2 kg).  General - Well nourished, well developed, in no apparent distress.  Ophthalmologic - Sharp disc margins OU.   Cardiovascular - Regular rate and rhythm with no murmur.  Mental Status -  Awake, alert, orientation to time, place, and person were intact. Language including expression, naming, repetition, comprehension was assessed and found intact , mild dysarthria. Not able to backward spelling WORLD, but able to calculate Registration 3/3 and delayed recall 3/3 Fund of knowledge impaired.  Cranial Nerves II - XII - II - Visual field intact OU. III, IV, VI - Extraocular movements intact. V - Facial sensation intact bilaterally. VII - mild right facial asymmetry (chronic after right facial surgery in the past). VIII - Hearing & vestibular intact bilaterally. X - Palate elevates symmetrically, mild dysarthria. XI - Chin turning & shoulder shrug intact bilaterally. XII - Tongue  protrusion intact.  Motor Strength - The patient's strength was normal in all extremities and pronator drift was absent.  Bulk was normal and fasciculations were absent.   Motor Tone - Muscle tone was assessed at the neck and appendages and was normal.  Reflexes - The patient's reflexes were 1+ in all extremities and he had no pathological reflexes.  Sensory - Light touch, temperature/pinprick, vibration and proprioception, and Romberg testing were assessed and were normal.    Coordination - The patient had normal movements in the hands and feet with no ataxia or dysmetria.  Tremor was absent.  Gait and Station - walk without device, mildly stooped posturing  Data reviewed: I personally reviewed the images and agree with the radiology interpretations.  Ct Head Wo [name] 06/20/2015   Stable atrophic and ischemic changes. No acute abnormality is noted.   MRI brain 06/15/15 Areas of acute infarction in the region of the insula and posterior frontal lobe on the right consistent with right MCA branch vessel occlusion. No swelling or hemorrhage.  Extensive chronic small vessel ischemic changes elsewhere throughout the brain, progressive since the previous exam. Given the history of multiple sclerosis clinically, some of these white matter lesions could relate to chronic demyelination.  [name] [name] 06/20/2015   Subcentimeter focus of restricted diffusion in the superior RIGHT insula not present previously, representing progression of ischemia. Otherwise stable multifocal areas of acute RIGHT MCA infarction. No interval development of large vessel occlusion, or significant mass effect. Mild postcontrast enhancement, consistent with the subacute time course. There is no significant change on DWI comparing with previous MRI. The "new" DWI signal likely the same DWI as before just due to different cut.   CT Angio Head 06/16/2015 CT HEAD: Evolving small RIGHT frontal/ MCA territory  infarct. Moderate to severe white matter changes better characterized on recent MRI of the brain. CTA HEAD: Negative.  2D Echocardiogram 06/16/15 - Left ventricle: The cavity size was normal. There was mild focal basal hypertrophy of the septum. Systolic function was normal. The estimated ejection fraction was in the range of 60% to 65%. Wall motion was normal; there were no regional wall motion abnormalities. Doppler parameters are consistent with abnormal left ventricular relaxation (grade 1 diastolic dysfunction). - Aortic valve: There was mild regurgitation. Impressions: - No cardiac source of emboli was indentified.  Carotid Doppler 06/16/15 There is 1-39% bilateral ICA stenosis. Vertebral artery flow is antegrade.   LE venous doppler - There is no DVT or SVT noted in the bilateral lower extremities.   TEE - normal EF, no SOE, no PFO  CT chest 06/05/15 1. The nodules are stable from 06/26/2013. More spiculated nodules inferomedially in the right middle lobe were not previously positive on PET scan and accordingly are likely benign. The 5 by 6 mm nodule is also stable over the past 2 years. This 2 year stability is compatible with benign process, and based on current guidelines, further follow up surveillance is not required. 2. 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 Recommend annual imaging followup by CTA or MRA. 3. Coronary, aortic arch, and branch vessel atherosclerotic vascular disease.  Ct Head Code Stroke W/o Cm 04/04/2016 1. No acute abnormality 2. ASPECTS is 10  Ct Angio Head W Or Wo [name] Ct Angio Neck W Or Wo [name] 04/04/2016 No significant carotid artery stenosis. Anterior and middle cerebral arteries patent without significant stenosis or occlusion Severe stenosis origin of the left vertebral artery and mild stenosis origin of the right vertebral artery. Posterior intracranial circulation is patent.   [name] Brain Wo  [name] 04/05/2016 Subcentimeter acute RIGHT cerebellar and RIGHT occipital lobe infarcts. Old small RIGHT frontal lobe infarct and bold small cerebellar infarcts. Moderate chronic small vessel ischemic disease.   2D Echocardiogram  - Left ventricle: The cavity size was normal. Wall thickness wasnormal. Systolic function was normal. The estimated ejectionfraction was in the range of 55% to 60%. Wall motion was normal;there were no regional wall motion abnormalities. Leftventricular diastolic function parameters were normal, whenadjusted for age. - Aortic valve: There was mild regurgitation. - Mitral valve: There was mild regurgitation.  TCD with bubble study No HITS. No PFO  CT chest 04/28/16 1. Stable fusiform dilatation of the ascending aorta measuring 4.0 cm in maximum diameter. Recommend annual imaging followup by CTA or MRA. This recommendation follows 2010 ACCF/AHA/AATS/ACR/ASA/SCA/SCAI/SIR/STS/SVM Guidelines for the Diagnosis and Management of Patients with Thoracic Aortic Disease. Circulation. 2010; 121: W098-J191. 2. Moderate thoracic aortic atherosclerosis. 3. Coronary artery calcifications. 4. Stable pulmonary nodules on the  right consistent with prior inflammation and probable subsequent scarring. No new or enlarging pulmonary nodule is seen.  Ct Head Wo [name]  06/21/2016 IMPRESSION: Chronic ischemic changes and mild atrophic changes similar to that seen on previous exam. No acute abnormality is identified.   Ct Chest W [name] Ct Abdomen Pelvis W [name] 06/22/2016 IMPRESSION: 1. Solid nodule within the perifissural right middle lobe is stable from previous exam. The nodular densities within the right middle lobe have decreased in size when compared with 06/05/15 favoring a benign process. 2. Borderline enlarged sub- carinal lymph node appears mildly increased in size from previous exam. 11 mm on today's study versus 7 mm previously. 3. No mass or adenopathy  identified within the abdomen or pelvis. 4.  Aortic Atherosclerosis (ICD10-I70.0).  [name] Brain Wo [name] 06/21/2016 IMPRESSION: 1. Small acute/early subacute infarct within the left lateral parietal lobe and subcentimeter focus in left superior parietal lobe. No hemorrhage or significant associated mass effect. 2. Background of moderate chronic microvascular ischemic changes and parenchymal volume loss of the brain. Several chronic lacunar infarcts in basal ganglia and cerebellum.  [name] [name] Head/brain Wo Cm 06/22/2016 IMPRESSION: Negative intracranial MRA. No large or proximal arterial branch occlusion. No high-grade or correctable stenosis.  LE venous doppler - No evidence of deep vein thrombosis or baker's cysts bilaterally.  TTE 04/2016 - Left ventricle: The cavity size was normal. There was mild focal basal hypertrophy of the septum. Systolic function was normal. The estimated ejection fraction was in the range of 60% to 65%. Wall motion was normal; there were no regional wall motion abnormalities. Doppler parameters are consistent with abnormal left ventricular relaxation (grade 1 diastolic dysfunction). - Aortic valve: There was mild regurgitation. Impressions: - No cardiac source of emboli was indentified.  Ct Head Wo [name]   10/22/2016 No acute findings. No intracranial hemorrhage. Stable chronic small vessel ischemic disease.  Sleep study 1. Obstructive Sleep Apnea (OSA)  2. Dysfunctions associated with sleep stages or arousals from  sleep   Component     Latest Ref Rng & Units 06/16/2015 06/21/2015 06/26/2015  Cholesterol     0 - 200 mg/dL 185 140   Triglycerides     <150 mg/dL 256 (H) 239 (H)   HDL Cholesterol     >40 mg/dL 43 33 (L)   Total CHOL/HDL Ratio     RATIO 4.3 4.2   VLDL     0 - 40 mg/dL 51 (H) 48 (H)   LDL (calc)     0 - 99 mg/dL 91 59   Hemoglobin A1C     <5.7 % 6.4 (H)  6.3 (H)  Mean Plasma Glucose     mg/dL 137  134   Component      Latest Ref Rng & Units 04/05/2016  Cholesterol     0 - 200 mg/dL 134  Triglycerides     <150 mg/dL 150 (H)  HDL Cholesterol     >40 mg/dL 43  Total CHOL/HDL Ratio     RATIO 3.1  VLDL     0 - 40 mg/dL 30  LDL (calc)     0 - 99 mg/dL 61  Hemoglobin A1C     4.8 - 5.6 % 5.6  Mean Plasma Glucose     mg/dL 114   Component     Latest Ref Rng & Units 06/22/2016  Cholesterol     0 - 200 mg/dL 122  Triglycerides     <150 mg/dL 220 (H)  HDL  Cholesterol     >40 mg/dL 42  Total CHOL/HDL Ratio     RATIO 2.9  VLDL     0 - 40 mg/dL 44 (H)  LDL (calc)     0 - 99 mg/dL 36  Hemoglobin A1C     4.8 - 5.6 % 6.0 (H)  Mean Plasma Glucose     mg/dL 126    Assessment: As you may recall, he is a 77 y.o. Caucasian male with PMH of MS diagnosis in 1963 not on meds, CAD, thoracic AA, history of lung nodule benign, urethral carcinoma following with [facility], diastolic CHF, HTN, HLD and GERD admitted on 06/15/2015 for right MCA branch vessel occlusion with infarcts at right insular and posterior frontal lobe. CT head, carotid Doppler, TTE were all negative. LDL 91 and A1c 6.4. His aspirin 81 changed to 325, resumed Lipitor 20, and he was discharged home outpatient PT OT and plan with outpatient TEE and Loop recorder. However, he was readmitted on 06/20/2015 for presyncope with UTI, generalized weakness, dysarthria and gait instability. MRI showed essentially the same infarct as last admission. Symptoms consider as recrudescence of previous stroke in the setting of infection and presyncope. DVT and TEE negative without PFO. Loop recorder placed. Aspirin was changed to Plavix on discharge.   Admitted on 04/04/16 and receivedIV t-PA. MRI showed small right cerebellar and right occipital lobe with left ACA punctate infarct, again embolic pattern. CTA head and neck only showed left VA stenosis. EF 55-60%. TCD bubble study negative for PFO, and loop recorder showed no afib. LDL 61 and A1C 5.6. His plavix changed to  aggrenox on discharge. CTA chest showed fusiform dilatation of ascending aorta and mild to moderate thoracic descending aorta atherosclerosis, which can not explain the stroke.Marland Kitchen   He follows with [facility] for urethral cancer and recurrent UTI. So far no recurrence or metastasis found. Admitted on 06/21/16 for aphasia and left facial droop. MRI showed left MCA acute left lateral parietal and left superior parietal infarcts, embolic pattern. MRA, TTE and DVT negative. Loop recorder again no afib. Pan CT no evidence of cancer metastasis. LDL 36 and A1C 5.6. After discussion with pt and family, we started eliquis 5mg  bid for stroke prevention. During the interval time, pt has been doing well. Speech improved with home self exercise.  Regarding previous MS diagnosis in 1960s, patient has not been on any MS medication, no MS relapse, no significant symptoms of MS, I am not sure about the diagnosis, MRI white matter changes could be small vessel etiology instead of chronic demyelination. Do not think he needs of initiating MS treatment at this time.  Complained of morning HAs. CT head no bleeding. Had ambulatory sleep study showed severe OSA.  He is going to have CPAP titration next Monday. He has been on Effexor for a while, and now added Abilify, his agitation and emotional swing much improved.    Plan:  - continue eliquis and lipitor for stroke prevention - follow up loop recorder. - follow up with Dr. Rexene Alberts for sleep apnea  - continue follow up with oncology. Avoid bleeding with catherization.  - Follow up with your primary care physician for stroke risk factor modification. Recommend maintain blood pressure goal 130-140/80-90, diabetes with hemoglobin A1c goal below 7.0% and lipids with LDL cholesterol goal below 70 mg/dL.  - check BP at home and record. - healthy diet and regular exercise - follow up in 4 months.  I spent more than 25  minutes of face to face time with the patient.  Greater than 50% of time was spent in counseling and coordination of care. We discussed morning HA, BP monitoring, sleep study.   No orders of the defined types were placed in this encounter.   No orders of the defined types were placed in this encounter.   Patient Instructions  - continue eliquis and lipitor for stroke prevention - follow up loop recorder. - follow up with Dr. Rexene Alberts for sleep apnea  - continue follow up with oncology. Avoid bleeding with catherization.  - Follow up with your primary care physician for stroke risk factor modification. Recommend maintain blood pressure goal 130-140/80-90, diabetes with hemoglobin A1c goal below 7.0% and lipids with LDL cholesterol goal below 70 mg/dL.  - check BP at home and record. - healthy diet and regular exercise - follow up in 4 months.    Rosalin Hawking, MD PhD [facility] [address] [place], [place] [address] [phone]

## 2017-03-07 NOTE — Patient Instructions (Addendum)
-   continue eliquis and lipitor for stroke prevention - follow up loop recorder. - follow up with Dr. Rexene Alberts for sleep apnea  - continue follow up with oncology. Avoid bleeding with catherization.  - Follow up with your primary care physician for stroke risk factor modification. Recommend maintain blood pressure goal 130-140/80-90, diabetes with hemoglobin A1c goal below 7.0% and lipids with LDL cholesterol goal below 70 mg/dL.  - check BP at home and record. - healthy diet and regular exercise - follow up in 4 months.

## 2017-03-09 ENCOUNTER — Encounter: Payer: Self-pay | Admitting: Physician Assistant

## 2017-03-10 ENCOUNTER — Other Ambulatory Visit: Payer: Self-pay | Admitting: Family Medicine

## 2017-03-16 ENCOUNTER — Ambulatory Visit (INDEPENDENT_AMBULATORY_CARE_PROVIDER_SITE_OTHER): Payer: Medicare Other | Admitting: Physician Assistant

## 2017-03-16 ENCOUNTER — Encounter: Payer: Self-pay | Admitting: Physician Assistant

## 2017-03-16 VITALS — BP 148/80 | HR 84 | Ht 72.0 in | Wt 238.8 lb

## 2017-03-16 DIAGNOSIS — Z8673 Personal history of transient ischemic attack (TIA), and cerebral infarction without residual deficits: Secondary | ICD-10-CM | POA: Diagnosis not present

## 2017-03-16 DIAGNOSIS — I639 Cerebral infarction, unspecified: Secondary | ICD-10-CM

## 2017-03-16 DIAGNOSIS — I10 Essential (primary) hypertension: Secondary | ICD-10-CM

## 2017-03-16 DIAGNOSIS — I700 Atherosclerosis of aorta: Secondary | ICD-10-CM | POA: Diagnosis not present

## 2017-03-16 DIAGNOSIS — I5032 Chronic diastolic (congestive) heart failure: Secondary | ICD-10-CM

## 2017-03-16 DIAGNOSIS — I2510 Atherosclerotic heart disease of native coronary artery without angina pectoris: Secondary | ICD-10-CM | POA: Diagnosis not present

## 2017-03-16 DIAGNOSIS — I712 Thoracic aortic aneurysm, without rupture, unspecified: Secondary | ICD-10-CM

## 2017-03-16 MED ORDER — METOPROLOL SUCCINATE ER 100 MG PO TB24: 150.0000 mg | ORAL_TABLET | Freq: Every day | ORAL | 3 refills | Status: DC

## 2017-03-16 MED ORDER — METOPROLOL SUCCINATE ER 100 MG PO TB24: 150.0000 mg | ORAL_TABLET | ORAL | 3 refills | Status: DC

## 2017-03-16 NOTE — Progress Notes (Signed)
Cardiology Office Note:    Date:  03/16/2017   ID:  [name], DOB 1940-11-29, MRN [id number]  PCP:  [name], MD  Cardiologist:  Nelva Bush, MD  Pulmonology: Dr. Melvyn Novas Neurology: Dr. Erlinda Hong  Referring MD: [name], MD   Chief Complaint  Patient presents with  . Follow-up    CHF, aneurysm, HTN    History of Present Illness:    [name] [name] is a 77 y.o. male with coronary artery disease (nonobstructive by cardiac catheterization in 9528), diastolic heart failure, COPD, recurrent strokes, hypertension, hyperlipidemia, obstructive sleep apnea, urethral cancer.  He has been admitted several times since 2017 with recurrent embolic CVAs.  He was treated with TPA in April 2018.  He is status post Linq ILR.  CT scan in 2018 demonstrated stable ascending aortic aneurysm at 4 cm and moderate thoracic aortic atherosclerosis.  He is now on anticoagulation with Apixaban.  He was last seen in clinic by Dr. Saunders Revel in December 2018.  He followed up in the hypertension clinic twice and was last seen in January 2019.  At last visit, his blood pressure was optimal.  Mr. Farnell returns for follow-up.  He is here today with his wife.  He has not had any further stroke symptoms since starting on Apixaban.  He has been diagnosed with sleep apnea.  He is now on CPAP.  He has chronic dyspnea with exertion without significant change.  He is not having orthopnea or paroxysmal nocturnal dyspnea.  His lower extremity edema is overall improved.  He denies chest discomfort or syncope.  He denies any bleeding issues.  Recently, he has had recurrent right shoulder pain.  He has had surgery on that shoulder in the past.  I have asked him to follow-up with orthopedics.  Prior CV studies:   The following studies were reviewed today:  Chest CTA 6/18 Stable fusiform dilatation of the ascending thoracic aorta measuring 4 cm  Chest CTA 04/28/16 IMPRESSION: 1. Stable fusiform dilatation of the  ascending aorta measuring 4.0 cm in maximum diameter. Recommend annual imaging followup by CTA or MRA. This recommendation follows 2010 ACCF/AHA/AATS/ACR/ASA/SCA/SCAI/SIR/STS/SVM Guidelines for the Diagnosis and Management of Patients with Thoracic Aortic Disease. Circulation. 2010; 121: U132-G401. 2. Moderate thoracic aortic atherosclerosis. 3. Coronary artery calcifications. 4. Stable pulmonary nodules on the right consistent with prior inflammation and probable subsequent scarring. No new or enlarging pulmonary nodule is seen.   Echo 04/06/16 EF 55-60, normal wall motion, normal diastolic function, mild AI, mild MR   Echo 06/16/15 Mild focal basal septal hypertrophy, EF 60-65%, normal wall motion, grade 1 diastolic dysfunction, mild AI   Carotid US 06/16/15 Summary:Bilateral: mild soft plaque CCA and origin ICA. 1-39% ICA plaquing.   Chest CT 6/17 IMPRESSION: 1. The nodules are stable from 06/26/2013. More spiculated nodules inferomedially in the right middle lobe were not previously positive on PET scan and accordingly are likely benign. The 5 by 6 mm nodule is also stable over the past 2 years. This 2 year stability is compatible with benign process, and based on current guidelines, further follow up surveillance is not required. 2. 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 Recommend annual imaging followup by CTA or MRA. This recommendation follows 2010 ACCF/AHA/AATS/ACR/ASA/SCA/SCAI/SIR/STS/SVM Guidelines for the Diagnosis and Management of Patients with Thoracic Aortic Disease. Circulation. 2010; 121: U272-Z366 3. Coronary, aortic arch, and branch vessel atherosclerotic vascular disease.   LHC 1/15 LM No significant disease.   LAD: Luminal irregularities.  LCx: serial 50% and 60% stenoses in the mid LCx.   RCA: 50% proximal RCA stenosis.   LVEF is estimated at 55% without significant wall motion abnormalities noted in RAO projection.   Final Conclusions:   I  suspect that at this point COPD is the major cause of his dyspnea.    Echo 12/14 Inferior and distal septal HK, mild LVH, EF 50-55%, mild MR, mild LAE   Past Medical History:  Diagnosis Date  . Arthritis    s/p TKR  . Bladder cancer (HCC)    [facility], [facility] low grade papillary urothileal carcinoma  . BPH (benign prostatic hyperplasia)   . Brugada syndrome    Possible Type II Brugada ECG pattern. No family history of SCD, no syncope, no tachypalpitations.  Marland Kitchen CAD (coronary artery disease)    a. LHC 1/15 - mid LCx 50 and 60%, proximal RCA 50%  . Chronic diastolic CHF (congestive heart failure) ([place])   . CKD (chronic kidney disease)   . COPD (chronic obstructive pulmonary disease) ([place])    Prior heavy smoker. PFTs (3/11): FVC 87%, FEV1 73%, ratio 0.57, DLCO 75%, TLC 121%. Moderate obstructive defect. PFTs (9/15): Only minimal obstruction, sugPrior heavy smoker. PFTs (3/11): FVC 87%, FEV1 73%, ratio 0.57, DLCO 75%, TLC 121%. Moderate obstructive defect. PFTs (9/15) minimal obstruction, poss asthma component   . Depression    with bipolar tendencies  . DOE (dyspnea on exertion)    a. Myoview 8/09- EF 57%, no ischemia // b. Myoview (3/11) EF 68%, diaphragmatic attenuation, no ischemia //  c Echo (4/11) EF 93-90%, mild diastolic dysfunction, PASP 38 mmHg // d. PFTs 9/15 minimal obstruction  /  e. CT negative for ILD  . GERD (gastroesophageal reflux disease)   . History of Doppler ultrasound    Carotid US 3/11 negative for significant stenosis  //  carotid US 6/17: Mild bilateral plaque, 1-39% ICA  . History of echocardiogram    a. Echo 12/14 Inferior and distal septal HK, mild LVH, EF 50-55%, mild MR, mild LAE  //  b. Echo 6/17: Mild focal basal septal hypertrophy, EF 60-65%, normal wall motion, grade 1 diastolic dysfunction, mild AI  . HLD (hyperlipidemia)   . Hypertensive heart disease with CHF (congestive heart failure) ([place]) 04/08/2009  . Low testosterone   . Lung nodule    a. CT in 2015 >>  PET in 12/15 not sugg of malignancy   . LVH (left ventricular hypertrophy)    a. Echo 12/14 Inferior and distal septal HK, mild LVH, EF 50-55%, mild MR, mild LAE  . Mild dementia   . Mitral regurgitation    Echo (4/11) with PISA ERO 0.3 cm^2 and regurgitant volume 41 mL (moderate MR). Echo (12/14) with mild MR.   . MS (multiple sclerosis) ([place])   . MS (multiple sclerosis) ([place])   . OSA (obstructive sleep apnea)   . Right bundle branch block   . Stroke ([place])   . Thoracic aortic aneurysm (HCC)    4.1 cm 2017  . Urinary retention with incomplete bladder emptying    receives botox injections and treatment for BPH through [facility]    Past Surgical History:  Procedure Laterality Date  . APPENDECTOMY    . EP IMPLANTABLE DEVICE N/A 06/23/2015   Procedure: Loop Recorder Insertion;  Surgeon: Deboraha Sprang, MD;  Location: [place] [facility];  Service: Cardiovascular;  Laterality: N/A;  . HEMORRHOID SURGERY    . LEFT HEART CATHETERIZATION WITH CORONARY ANGIOGRAM N/A 01/05/2013  Procedure: LEFT HEART CATHETERIZATION WITH CORONARY ANGIOGRAM;  Surgeon: Larey Dresser, MD;  Location: [facility] CATH LAB;  Service: Cardiovascular;  Laterality: N/A;  . ROTATOR CUFF REPAIR    . TEE WITHOUT CARDIOVERSION N/A 06/23/2015   Procedure: TRANSESOPHAGEAL ECHOCARDIOGRAM (TEE);  Surgeon: Fay Records, MD;  Location: [facility] ENDOSCOPY;  Service: Cardiovascular;  Laterality: N/A;  . TONSILLECTOMY    . TOTAL KNEE ARTHROPLASTY Right   . TRANSURETHRAL RESECTION OF PROSTATE      Current Medications: Current Meds  Medication Sig  . acetaminophen (TYLENOL) 325 MG tablet Take 650 mg by mouth every 6 (six) hours as needed (for pain or headaches).   Marland Kitchen apixaban (ELIQUIS) 5 MG TABS tablet Take 1 tablet (5 mg total) by mouth 2 (two) times daily.  . ARIPiprazole (ABILIFY) 5 MG tablet Take 1 tablet (5 mg total) by mouth daily.  Marland Kitchen atorvastatin (LIPITOR) 40 MG tablet Take 40 mg by mouth daily.  . cholecalciferol (VITAMIN D) 400 units  TABS tablet Take 400 Units by mouth daily.   . Cyanocobalamin (B-12) 1000 MCG CAPS Take 1,000 mcg by mouth daily.   Marland Kitchen doxazosin (CARDURA) 2 MG tablet TAKE 1 TABLET DAILY  . folic acid (FOLVITE) 1 MG tablet Take 1 mg by mouth daily.  . furosemide (LASIX) 40 MG tablet TAKE 1 TABLET(40 MG) BY MOUTH DAILY  . HYDROcodone-acetaminophen (NORCO) 10-325 MG tablet Take 1 tablet by mouth every 6 (six) hours as needed for moderate pain or severe pain.  Marland Kitchen losartan (COZAAR) 100 MG tablet Take 1 tablet (100 mg total) by mouth daily.  Marland Kitchen MAGNESIUM CITRATE PO Take 0.5-1 Bottles by mouth once as needed (for mild constipation).   . pantoprazole (PROTONIX) 40 MG tablet TAKE 1 TABLET DAILY  . PROAIR HFA 108 (90 Base) MCG/ACT inhaler USE 1 TO 2 INHALATIONS EVERY 6 HOURS AS NEEDED FOR WHEEZING OR SHORTNESS OF BREATH  . rOPINIRole (REQUIP) 1 MG tablet TAKE 1 TABLET AT BEDTIME  . Tiotropium Bromide Monohydrate (SPIRIVA RESPIMAT) 2.5 MCG/ACT AERS Inhale 2 puffs into the lungs daily.  Marland Kitchen venlafaxine XR (EFFEXOR-XR) 150 MG 24 hr capsule TAKE 1 CAPSULE TWICE A DAY  . [DISCONTINUED] metoprolol succinate (TOPROL-XL) 100 MG 24 hr tablet Take 1 tablet (100 mg total) by mouth daily. Take with or immediately following a meal.     Allergies:   Alcohol-sulfur [sulfur]; Bupivacaine; Clomipramine hcl; Flecainide; Lithium; Acetylcholine; Amitriptyline; Cocaine; Desipramine; Ergonovine; Loxapine; Nortriptyline; Oxcarbazepine; Procainamide; Procaine; Propafenone; Propofol; and Trifluoperazine   Social History   Tobacco Use  . Smoking status: Former Smoker    Packs/day: 2.00    Years: 40.00    Pack years: 80.00    Types: Cigarettes    Last attempt to quit: 01/04/1993    Years since quitting: 24.2  . Smokeless tobacco: Former Systems developer    Types: Chew  Substance Use Topics  . Alcohol use: Yes    Alcohol/week: 15.0 oz    Types: 25 Cans of beer per week    Comment: 12 pack a week  . Drug use: No     Family Hx: The patient's family  history includes Arthritis in his brother; Hypertension in his sister and unknown relative; Kidney cancer in his brother; Other in his brother; Stroke in his father and mother. There is no history of Prostate cancer or Bladder Cancer.  ROS:   Please see the history of present illness.    Review of Systems  Constitution: Positive for malaise/fatigue and weight gain.  Cardiovascular: Positive  for leg swelling.  Respiratory: Positive for shortness of breath, snoring and wheezing.   Musculoskeletal: Positive for joint swelling.  Genitourinary: Positive for incomplete emptying.  Neurological: Positive for headaches and loss of balance.  Psychiatric/Behavioral: Positive for depression. The patient is nervous/anxious.    All other systems reviewed and are negative.   EKGs/Labs/Other Test Reviewed:    EKG:  EKG is  ordered today.  The ekg ordered today demonstrates normal sinus rhythm, heart rate 84, left axis deviation, incomplete right bundle branch block, QTC 425, no change from prior tracing  Recent Labs: 04/07/2016: NT-Pro BNP 117 06/21/2016: ALT 16 09/03/2016: Hemoglobin 13.4; Platelets 295 01/14/2017: BUN 21; Creat 1.16; Potassium 4.5; Sodium 140   Recent Lipid Panel Lab Results  Component Value Date/Time   CHOL 122 06/22/2016 06:07 AM   TRIG 220 (H) 06/22/2016 06:07 AM   HDL 42 06/22/2016 06:07 AM   CHOLHDL 2.9 06/22/2016 06:07 AM   LDLCALC 36 06/22/2016 06:07 AM   LDLDIRECT 87.0 02/01/2014 10:19 AM    Physical Exam:    VS:  BP (!) 148/80   Pulse 84   Ht 6' (1.829 m)   Wt 238 lb 12.8 oz (108.3 kg)   SpO2 95%   BMI 32.39 kg/m     Wt Readings from Last 3 Encounters:  03/16/17 238 lb 12.8 oz (108.3 kg)  03/07/17 243 lb (110.2 kg)  02/15/17 243 lb (110.2 kg)     Physical Exam  Constitutional: He is oriented to person, place, and time. He appears well-developed and well-nourished. No distress.  HENT:  Head: Normocephalic and atraumatic.  Neck: No JVD present.    Cardiovascular: Normal rate and regular rhythm.  No murmur heard. Pulmonary/Chest: Effort normal. He has decreased breath sounds. He has no rales.  Musculoskeletal: He exhibits edema (trace bilat LE edema).  Neurological: He is alert and oriented to person, place, and time.  Skin: Skin is warm and dry.    ASSESSMENT & PLAN:    #1.  Chronic diastolic CHF (congestive heart failure) (HCC) NYHA 2 b-3.  EF 55-60 by echo in 4/18.  Volume appears stable.  His chronic dyspnea is multifactorial and related to CHF, COPD, deconditioning, obesity.  Continue current therapy.  #2.  History of embolic stroke Loop recorder to date has not demonstrated atrial fibrillation.  He has not had any further stroke symptoms since starting on Apixaban.  #3.  Coronary artery disease  He has moderate nonobstructive coronary disease by cardiac catheterization in 2015.  He is not on aspirin as he is on Apixaban.  He is not having angina.  Continue statin.  #4.  Thoracic aortic aneurysm without rupture [facility]) Arrange follow-up CT in June 2019.  #5.  Essential hypertension Blood pressure above target.  Increase metoprolol succinate to 150 mg daily.  Continue current dose of doxazosin, losartan.  #6.  Thoracic aortic atherosclerosis Noted on prior CT.  He is not on aspirin as he is on Apixaban.  Continue statin therapy.   Dispo:  Return in about 3 months (around 06/16/2017) for Routine Follow Up, w/ Richardson Dopp, PA-C.   Medication Adjustments/Labs and Tests Ordered: Current medicines are reviewed at length with the patient today.  Concerns regarding medicines are outlined above.  Tests Ordered: Orders Placed This Encounter  Procedures  . CT ANGIO CHEST AORTA W &/OR WO CONTRAST  . Basic Metabolic Panel (BMET)  . EKG 12-Lead   Medication Changes: Meds ordered this encounter  Medications  . metoprolol succinate (TOPROL-XL)  100 MG 24 hr tablet    Sig: Take 2 tablets (200 mg total) by mouth daily. Take with  or immediately following a meal.    Dispense:  135 tablet    Refill:  3    DOSE CHANGE    Signed, Richardson Dopp, PA-C  03/16/2017 3:32 PM    [facility] [address], [place], [place]  [address] Phone: [phone]; Fax: [phone]

## 2017-03-16 NOTE — Addendum Note (Signed)
Addended by: Michae Kava on: 03/16/2017 06:22 PM   Modules accepted: Orders

## 2017-03-16 NOTE — Patient Instructions (Addendum)
Medication Instructions:  Increase the Toprol-XL (Metoprolol succinate) to 150 mg Once daily (take 1 and 1/2 tablets of the 100 mg to = 150 mg).  Labwork: BMET TO BE DONE 06/16/17, THIS WILL BE 1 WEEK BEFORE YOUR CT  Testing/Procedures: Schedule Chest CTA TO BE ON OR AFTER 06/22/17.  Follow-Up: Richardson Dopp, PA-C in June 2019 1 week after the CT is done.  Any Other Special Instructions Will Be Listed Below (If Applicable).  If you need a refill on your cardiac medications before your next appointment, please call your pharmacy.

## 2017-03-16 NOTE — Progress Notes (Signed)
I called [facility] tonight to with the new directions for the Toprol XL 100 mg tablet new directions are : take 1 and 1/2 tablets daily = 150 mg daily. Rx was called in for # 135 x 3.

## 2017-03-18 ENCOUNTER — Other Ambulatory Visit: Payer: Self-pay | Admitting: Internal Medicine

## 2017-03-18 MED ORDER — METOPROLOL SUCCINATE ER 100 MG PO TB24: ORAL_TABLET | ORAL | 3 refills | Status: DC

## 2017-03-21 LAB — CUP PACEART REMOTE DEVICE CHECK
Date Time Interrogation Session: 20190215154557
MDC IDC PG IMPLANT DT: 20170619

## 2017-03-23 ENCOUNTER — Ambulatory Visit (INDEPENDENT_AMBULATORY_CARE_PROVIDER_SITE_OTHER): Payer: Medicare Other | Admitting: *Deleted

## 2017-03-23 DIAGNOSIS — I639 Cerebral infarction, unspecified: Secondary | ICD-10-CM

## 2017-03-24 NOTE — Progress Notes (Signed)
Carelink Summary Report / Loop Recorder 

## 2017-03-28 ENCOUNTER — Other Ambulatory Visit: Payer: Self-pay | Admitting: Family Medicine

## 2017-03-28 MED ORDER — HYDROCODONE-ACETAMINOPHEN 10-325 MG PO TABS: 1.0000 | ORAL_TABLET | Freq: Four times a day (QID) | ORAL | 0 refills | Status: DC | PRN

## 2017-03-28 NOTE — Telephone Encounter (Signed)
Patient is requesting a refill on Hydrocodone   LOV: 01/14/17  LRF:   02/18/17

## 2017-03-28 NOTE — Telephone Encounter (Signed)
[name] [name] requesting a refill sent to [facility] on [address] for his hydrocodone  CB# [phone]

## 2017-04-22 ENCOUNTER — Telehealth: Payer: Self-pay | Admitting: *Deleted

## 2017-04-22 NOTE — Telephone Encounter (Signed)
[name] [name] wondering about having an MRI with [name] loop recorder. I advised that the device was MRI safe. She also asks about AF episodes- no AF episodes recorded over the lifetime of the LINQ (implanted 06/2015).

## 2017-04-25 ENCOUNTER — Ambulatory Visit (INDEPENDENT_AMBULATORY_CARE_PROVIDER_SITE_OTHER): Payer: Medicare Other | Admitting: *Deleted

## 2017-04-25 DIAGNOSIS — I639 Cerebral infarction, unspecified: Secondary | ICD-10-CM

## 2017-04-25 NOTE — Progress Notes (Signed)
Carelink Summary Report / Loop Recorder 

## 2017-04-30 LAB — CUP PACEART REMOTE DEVICE CHECK
Date Time Interrogation Session: 20190320181122
MDC IDC PG IMPLANT DT: 20170619

## 2017-05-02 ENCOUNTER — Encounter: Payer: Self-pay | Admitting: Family Medicine

## 2017-05-02 ENCOUNTER — Ambulatory Visit (INDEPENDENT_AMBULATORY_CARE_PROVIDER_SITE_OTHER): Payer: Medicare Other | Admitting: Family Medicine

## 2017-05-02 VITALS — BP 140/74 | HR 80 | Temp 98.1°F | Resp 20 | Ht 71.0 in | Wt 237.0 lb

## 2017-05-02 DIAGNOSIS — G4452 New daily persistent headache (NDPH): Secondary | ICD-10-CM | POA: Diagnosis not present

## 2017-05-02 DIAGNOSIS — R51 Headache: Secondary | ICD-10-CM

## 2017-05-02 DIAGNOSIS — I639 Cerebral infarction, unspecified: Secondary | ICD-10-CM | POA: Diagnosis not present

## 2017-05-02 DIAGNOSIS — R519 Headache, unspecified: Secondary | ICD-10-CM

## 2017-05-02 MED ORDER — GABAPENTIN 100 MG PO CAPS: 100.0000 mg | ORAL_CAPSULE | Freq: Three times a day (TID) | ORAL | 3 refills | Status: DC

## 2017-05-02 NOTE — Progress Notes (Signed)
Subjective:    Patient ID: [name] [name], male    DOB: 05/29/1940, 77 y.o.   MRN: [id number]  HPI  10/12/16 Wife brings him patient for evaluation to determine should he still be driving. Past medical history is significant for progressive multiple sclerosis, multiple ischemic strokes, chronic osteoarthritic pain on TID hydrocodone which contribute to this discussion. Physically, the patient has muscle strength 5 over 5 equal and symmetric in the arms and in the legs with normal reflexes. His physical reaction time is normal to catching a dropped object. Cerebellar exam is normal today however on Mini-Mental status exam, the patient tells me that the year is 1983. He is unable to correctly identify the month. He can recall 2 out of 3 objects on recall. He scores 3 out of 5 on world spelling in reverse. He is unable to perform serial sevens. It takes him 25 seconds to draw a clock although he does draw correctly. However he is unable to recognize patterns or to correctly copy intersecting pentagons.  At that time, my plan was: Patient has age-related memory loss compounded by multiple ischemic strokes, compounded by progressive multiple sclerosis, compounded by polypharmacy and chronic narcotic use all of which makes him a danger to drive. In general, I recommended that the patient not drive. Legally, I believe he would still qualify for restricted status. He must drive less than 45 miles an hour. He must avoid Interstate driving or driving on highways and remain driving only on 2 lane roads where there is less travel. I would not recommend driving at night or during conditions that would compromise his reaction time. After discussing these restrictions I explained to the patient and his wife that if he were my father, I would not recommend driving.  12/07/16 Patient is here today accompanied by his wife due to concerns over new persistent daily headache.  Review of the patient's chart reveals that he  has been seeing a neurologist for persistent daily headache.  Patient has had a CT scan of the brain in October because the headache was ruled out intracranial hemorrhage or space-occupying lesion in the brain.  As best I can gather from the patient's wife description, the thought is that his blood pressure medication or fluctuations in his blood pressure could possibly be causing his headache.  Patient is currently taking losartan, hydrochlorothiazide, Cardura.  He was on bisoprolol however he has been out of this medication for more than 4 months due to a pharmacy backorder.  His last medication change was Cardura which occurred earlier this summer.  Headaches apparently began around September.  Patient nor his wife are unable to provide home blood pressure readings.  However from review of his chart, it seems that his blood pressure is slightly elevated typically in the 564-332 range systolic.  However the blood pressure does not appear to be high enough to account for the headaches.  Furthermore only 6% of patients who take Cardura report headaches and he started the medication almost 3 months prior to the headaches beginning making me think that this is not a medication side effect which was the wife's primary concern.  Patient states that he wakes up with a headache every morning.  The location can vary.  It can be on the top of his scalp, and either temple, and his occiput.  It does not seem to focus in one area.  It is nonpulsatile.  It is more of a pressure-like sensation in the brain.  There  is no photophobia.  There is no phonophobia.  On encounter today, the patient has noticeable dysarthria which seems more pronounced than on previous exams.  Otherwise there are no new neurologic deficits.  Patient is currently taking extra strength Tylenol for the headaches on a daily basis.  He will also use hydrocodone.  His prescription for hydrocodone is initially intended for osteoarthritic pain.  He is written  for hydrocodone every 6 hours as needed and is given 100 tablets to last a month.  He typically takes 3 tablets a day or this is how it was intended.  However when I questioned the patient, he is self administering the medication and will frequently take 2 or 3 pills at a time and then take no further pain medication the rest of the day.  This is completely new to me.  I explained the patient that this is extremely dangerous and the medication is not to be administered this way.  Furthermore because of the patient's underlying memory issues and cognitive impairment, I believe she needs to monitor the administration of his pain medication and ensure that he is only taking it every 6 hours.  However I do not believe this is the cause of his headache.  He also reports hypersomnolence.  He wakes up with a headache usually worse in the morning.  He never feels rested.  He can easily fall asleep throughout the day after eating, watching TV, reading.  In fact he will take a nap every day afternoon.  On exam, he does have a large diameter neck in a relatively short mandible.  His airway is narrowed due to soft tissue in the palate and a relatively large tongue.  Therefore I believe he is at high risk for obstructive sleep apnea.  This could possibly be explaining his daily headaches, it could even explain his difficulty managing his blood pressure and possibly even some of his confusion and fatigue and worsening cognitive impairment.  According to the wife, the patient's neurologist as scheduled a sleep study however this will not occur for more than a month.  She is questioning about starting him on a daily medication to prevent migraine headaches such as Topamax.  At that time, my plan was: Rather than start the patient on a new daily medication to prevent headaches, I believe we need to focus on the source of the headaches rather than simply masking them.  I am very suspicious that the patient may have obstructive sleep  apnea.  I believe the sleep study will provide significant information that may help in the management of his chronic daily headache as well as help manage his blood pressure.  In the meantime, I have asked the patient's wife to check his blood pressure several times throughout the day and provide those records to me in a few days so that I can see how his blood pressure is fluctuating.  If in fact there are while fluctuations in his blood pressure, we can attempt to better manage his blood pressure however today his blood pressure in the office is actually well controlled.  I will also schedule the patient for an overnight pulse oximetry reading to determine if there is significant hypoxia that may suggest underlying sleep apnea as a potential cause of his chronic headache  05/02/17 Patient has been evaluated and found to have severe obstructive sleep apnea.  However he continues to have chronic daily headache located in his occiput.  [facility] having the patient try gabapentin for headache prevention.  Patient and his wife are interested in trying the medication.  CPAP is supposed to be getting arranged through DME.   Past Medical History:  Diagnosis Date  . Arthritis    s/p TKR  . Bladder cancer (HCC)    [facility], [facility] low grade papillary urothileal carcinoma  . BPH (benign prostatic hyperplasia)   . Brugada syndrome    Possible Type II Brugada ECG pattern. No family history of SCD, no syncope, no tachypalpitations.  Marland Kitchen CAD (coronary artery disease)    a. LHC 1/15 - mid LCx 50 and 60%, proximal RCA 50%  . Chronic diastolic CHF (congestive heart failure) ([place])   . CKD (chronic kidney disease)   . COPD (chronic obstructive pulmonary disease) ([place])    Prior heavy smoker. PFTs (3/11): FVC 87%, FEV1 73%, ratio 0.57, DLCO 75%, TLC 121%. Moderate obstructive defect. PFTs (9/15): Only minimal obstruction, sugPrior heavy smoker. PFTs (3/11): FVC 87%, FEV1 73%,  ratio 0.57, DLCO 75%, TLC 121%. Moderate obstructive defect. PFTs (9/15) minimal obstruction, poss asthma component   . Depression    with bipolar tendencies  . DOE (dyspnea on exertion)    a. Myoview 8/09- EF 57%, no ischemia // b. Myoview (3/11) EF 68%, diaphragmatic attenuation, no ischemia //  c Echo (4/11) EF 16-10%, mild diastolic dysfunction, PASP 38 mmHg // d. PFTs 9/15 minimal obstruction  /  e. CT negative for ILD  . GERD (gastroesophageal reflux disease)   . History of Doppler ultrasound    Carotid US 3/11 negative for significant stenosis  //  carotid US 6/17: Mild bilateral plaque, 1-39% ICA  . History of echocardiogram    a. Echo 12/14 Inferior and distal septal HK, mild LVH, EF 50-55%, mild MR, mild LAE  //  b. Echo 6/17: Mild focal basal septal hypertrophy, EF 60-65%, normal wall motion, grade 1 diastolic dysfunction, mild AI  . HLD (hyperlipidemia)   . Hypertensive heart disease with CHF (congestive heart failure) ([place]) 04/08/2009  . Low testosterone   . Lung nodule    a. CT in 2015 >> PET in 12/15 not sugg of malignancy   . LVH (left ventricular hypertrophy)    a. Echo 12/14 Inferior and distal septal HK, mild LVH, EF 50-55%, mild MR, mild LAE  . Mild dementia   . Mitral regurgitation    Echo (4/11) with PISA ERO 0.3 cm^2 and regurgitant volume 41 mL (moderate MR). Echo (12/14) with mild MR.   . MS (multiple sclerosis) ([place])   . MS (multiple sclerosis) ([place])   . OSA (obstructive sleep apnea)   . Right bundle branch block   . Stroke ([place])   . Thoracic aortic aneurysm (HCC)    4.1 cm 2017  . Urinary retention with incomplete bladder emptying    receives botox injections and treatment for BPH through [facility]   Past Surgical History:  Procedure Laterality Date  . APPENDECTOMY    . EP IMPLANTABLE DEVICE N/A 06/23/2015   Procedure: Loop Recorder Insertion;  Surgeon: Deboraha Sprang, MD;  Location: [place] CV LAB;  Service: Cardiovascular;  Laterality: N/A;  .  HEMORRHOID SURGERY    . LEFT HEART CATHETERIZATION WITH CORONARY ANGIOGRAM N/A 01/05/2013   Procedure: LEFT HEART CATHETERIZATION WITH CORONARY ANGIOGRAM;  Surgeon: Larey Dresser, MD;  Location: [facility] CATH LAB;  Service: Cardiovascular;  Laterality: N/A;  . ROTATOR CUFF REPAIR    . TEE WITHOUT CARDIOVERSION N/A 06/23/2015  Procedure: TRANSESOPHAGEAL ECHOCARDIOGRAM (TEE);  Surgeon: Fay Records, MD;  Location: [facility] ENDOSCOPY;  Service: Cardiovascular;  Laterality: N/A;  . TONSILLECTOMY    . TOTAL KNEE ARTHROPLASTY Right   . TRANSURETHRAL RESECTION OF PROSTATE     Current Outpatient Medications on File Prior to Visit  Medication Sig Dispense Refill  . acetaminophen (TYLENOL) 325 MG tablet Take 650 mg by mouth every 6 (six) hours as needed (for pain or headaches).     Marland Kitchen apixaban (ELIQUIS) 5 MG TABS tablet Take 1 tablet (5 mg total) by mouth 2 (two) times daily. 180 tablet 3  . ARIPiprazole (ABILIFY) 5 MG tablet Take 1 tablet (5 mg total) by mouth daily. 30 tablet 3  . atorvastatin (LIPITOR) 40 MG tablet Take 40 mg by mouth daily.    . cholecalciferol (VITAMIN D) 400 units TABS tablet Take 400 Units by mouth daily.     . Cyanocobalamin (B-12) 1000 MCG CAPS Take 1,000 mcg by mouth daily.     Marland Kitchen doxazosin (CARDURA) 2 MG tablet TAKE 1 TABLET DAILY 90 tablet 1  . folic acid (FOLVITE) 1 MG tablet Take 1 mg by mouth daily.    . furosemide (LASIX) 40 MG tablet TAKE 1 TABLET(40 MG) BY MOUTH DAILY 90 tablet 1  . HYDROcodone-acetaminophen (NORCO) 10-325 MG tablet Take 1 tablet by mouth every 6 (six) hours as needed for moderate pain or severe pain (DX: G89.4). 100 tablet 0  . MAGNESIUM CITRATE PO Take 0.5-1 Bottles by mouth once as needed (for mild constipation).     . metoprolol succinate (TOPROL-XL) 100 MG 24 hr tablet Take 1 1/2 tablets (150 mg total) by mouth daily. 135 tablet 3  . pantoprazole (PROTONIX) 40 MG tablet TAKE 1 TABLET DAILY 90 tablet 3  . PROAIR HFA 108 (90 Base) MCG/ACT inhaler USE 1 TO 2  INHALATIONS EVERY 6 HOURS AS NEEDED FOR WHEEZING OR SHORTNESS OF BREATH 25.5 g 4  . rOPINIRole (REQUIP) 1 MG tablet TAKE 1 TABLET AT BEDTIME 90 tablet 3  . Tiotropium Bromide Monohydrate (SPIRIVA RESPIMAT) 2.5 MCG/ACT AERS Inhale 2 puffs into the lungs daily. 3 Inhaler 3  . venlafaxine XR (EFFEXOR-XR) 150 MG 24 hr capsule TAKE 1 CAPSULE TWICE A DAY 180 capsule 3  . losartan (COZAAR) 100 MG tablet Take 1 tablet (100 mg total) by mouth daily. 90 tablet 3   No current facility-administered medications on file prior to visit.    Allergies  Allergen Reactions  . Alcohol-Sulfur [Sulfur] Other (See Comments)    Patient was suspected of having "Brugada Syndrome"  . Bupivacaine Other (See Comments)    Patient was suspected of having "Brugada Syndrome"  . Clomipramine Hcl Other (See Comments)    Patient was suspected of having "Brugada Syndrome"  . Flecainide Other (See Comments)    Patient was suspected of having "Brugada Syndrome"  . Lithium Other (See Comments)    Patient was suspected of having "Brugada Syndrome"  . Acetylcholine     Patient was suspected of having "Brugada Syndrome": (BrS) is a genetic condition that results in abnormal electrical activity within the heart, increasing the risk of sudden cardiac death. Those affected may have episodes of passing out.  . Amitriptyline Other (See Comments)    Patient was suspected of having "Brugada Syndrome"  . Cocaine Other (See Comments)    Patient was suspected of having "Brugada Syndrome"  . Desipramine Other (See Comments)    Patient was suspected of having "Brugada Syndrome"  . Ergonovine Other (  See Comments)    Patient was suspected of having "Brugada Syndrome"  . Loxapine Other (See Comments)    Patient was suspected of having "Brugada Syndrome"  . Nortriptyline Other (See Comments)    Patient was suspected of having "Brugada Syndrome"  . Oxcarbazepine Other (See Comments)    Patient was suspected of having "Brugada Syndrome"  .  Procainamide Other (See Comments)    Patient was suspected of having "Brugada Syndrome"  . Procaine Other (See Comments)    Patient was suspected of having "Brugada Syndrome"  . Propafenone Other (See Comments)    Patient was suspected of having "Brugada Syndrome"  . Propofol Other (See Comments)    Patient was suspected of having "Brugada Syndrome"  . Trifluoperazine Other (See Comments)    Patient was suspected of having "Brugada Syndrome"   Social History   Socioeconomic History  . Marital status: Married    Spouse name: Not on file  . Number of children: Not on file  . Years of education: Not on file  . Highest education level: Not on file  Occupational History  . Occupation: retired    Fish farm manager: RETIRED  Social Needs  . Financial resource strain: Not on file  . Food insecurity:    Worry: Not on file    Inability: Not on file  . Transportation needs:    Medical: Not on file    Non-medical: Not on file  Tobacco Use  . Smoking status: Former Smoker    Packs/day: 2.00    Years: 40.00    Pack years: 80.00    Types: Cigarettes    Last attempt to quit: 01/04/1993    Years since quitting: 24.3  . Smokeless tobacco: Former Systems developer    Types: Chew  Substance and Sexual Activity  . Alcohol use: Yes    Alcohol/week: 15.0 oz    Types: 25 Cans of beer per week    Comment: 12 pack a week  . Drug use: No  . Sexual activity: Not on file  Lifestyle  . Physical activity:    Days per week: Not on file    Minutes per session: Not on file  . Stress: Not on file  Relationships  . Social connections:    Talks on phone: Not on file    Gets together: Not on file    Attends religious service: Not on file    Active member of club or organization: Not on file    Attends meetings of clubs or organizations: Not on file    Relationship status: Not on file  . Intimate partner violence:    Fear of current or ex partner: Not on file    Emotionally abused: Not on file    Physically abused:  Not on file    Forced sexual activity: Not on file  Other Topics Concern  . Not on file  Social History Narrative   Retired from the [employer] after 20 years   [place] Veteran      Review of Systems  All other systems reviewed and are negative.      Objective:   Physical Exam  Constitutional: He is oriented to person, place, and time. He appears well-developed and well-nourished. No distress.  Cardiovascular: Normal rate, regular rhythm, normal heart sounds and intact distal pulses.  No murmur heard. Pulmonary/Chest: Effort normal and breath sounds normal. No respiratory distress. He has no wheezes. He has no rales.  Neurological: He is alert and oriented to person, place, and  time. He has normal reflexes. No cranial nerve deficit. He exhibits normal muscle tone. Coordination normal.  Skin: He is not diaphoretic.  Psychiatric: He has a normal mood and affect. His behavior is normal. Judgment and thought content normal.  Vitals reviewed.         Assessment & Plan:  Morning headache  New daily persistent headache  We can certainly try gabapentin 100 mg p.o. 3 times daily especially while the patient is arranging CPAP.  Recheck in 2 to 3 weeks.  If tolerating this dose, would increase to 300 mg 3 times daily

## 2017-05-08 ENCOUNTER — Encounter: Payer: Self-pay | Admitting: Neurology

## 2017-05-10 ENCOUNTER — Ambulatory Visit (INDEPENDENT_AMBULATORY_CARE_PROVIDER_SITE_OTHER): Payer: Medicare Other | Admitting: Neurology

## 2017-05-10 ENCOUNTER — Encounter: Payer: Self-pay | Admitting: Neurology

## 2017-05-10 VITALS — BP 173/97 | HR 93 | Ht 71.0 in | Wt 239.0 lb

## 2017-05-10 DIAGNOSIS — I639 Cerebral infarction, unspecified: Secondary | ICD-10-CM | POA: Diagnosis not present

## 2017-05-10 DIAGNOSIS — G4733 Obstructive sleep apnea (adult) (pediatric): Secondary | ICD-10-CM

## 2017-05-10 DIAGNOSIS — Z9989 Dependence on other enabling machines and devices: Secondary | ICD-10-CM | POA: Diagnosis not present

## 2017-05-10 NOTE — Patient Instructions (Addendum)
Please continue using your CPAP regularly. While your insurance requires that you use CPAP at least 4 hours each night on 70% of the nights, I recommend, that you not skip any nights and use it throughout the night if you can. Getting used to CPAP and staying with the treatment long term does take time and patience and discipline. Untreated obstructive sleep apnea when it is moderate to severe can have an adverse impact on cardiovascular health and raise her risk for heart disease, arrhythmias, hypertension, congestive heart failure, stroke and diabetes. Untreated obstructive sleep apnea causes sleep disruption, nonrestorative sleep, and sleep deprivation. This can have an impact on your day to day functioning and cause daytime sleepiness and impairment of cognitive function, memory loss, mood disturbance, and problems focussing. Using CPAP regularly can improve these symptoms.  I would suggest we increase your CPAP pressure to 9 cm at this time to optimize treatment.   Keep up the good work! We will see you back in 6 months, you can see one of our nurse practitioners and we will see you yearly thereafter.

## 2017-05-10 NOTE — Progress Notes (Signed)
Subjective:    Patient ID: [name] [name] is a 77 y.o. male.  HPI     Interim history:  [name] [name] is a 77 year old right-handed gentleman with an underlying complex medical history of right MCA stroke in June 2017, thoracic aortic aneurysm, coronary artery disease, diastolic congestive heart failure, hypertension, hyperlipidemia, reflux disease, urethral cancer, recurrent UTI, depression, memory loss and obesity, who presents for follow-up consultation of his obstructive sleep apnea, after starting CPAP therapy and undergoing a split-night sleep study. The patient is accompanied by his wife again today. I first met him on 12/16/2016 at the request of Dr. Erlinda Hong, at which time he reported snoring and daytime somnolence. I suggested we proceed with sleep study testing. He had a split-night sleep study on 02/16/2017. I went over his test results with him in detail today. Baseline sleep efficiency was 68.1%, sleep latency 79 minutes of REM sleep was absent. He had an increased percentage of slow-wave sleep and an increased percentage of stage I sleep. Total AHI was 36.1 per hour, REM AHI was not applicable. Average oxygen saturation 90%, nadir was 85%. He had no significant PLMS. He was fitted with a nasal interface and CPAP was titrated from 5 cm to 7 cm. On the final pressure his AHI was 0 per hour and supine non-REM sleep was achieved in O2 nadir was 89%. He had no significant PLMS. He did not achieve any REM sleep. Based on his test results I suggested a home CPAP therapy pressure at 8 cm.  Today, 05/10/2017: I reviewed his CPAP compliance data from 04/09/2017 through 05/08/2017 which is a total of 30 days, during which time he used his CPAP 29 days with percent used days greater than 4 hours at 87%, indicating very good compliance with an average usage of 9 hours and 3 minutes, residual AHI is suboptimal at 10.5 per hour, leak on the high side with the 95th percentile at 40.3 L a minute on a pressure of  8 cm with EPR of 3. In the past week his leak has improved tremendously. He reports very little, wife provides most of the Hx, but both agree, he sleeps better. She reports that he initially had a tendency to pull off the mask in the middle of the night but this has improved and she has also helped him tighten the mask a little bit more to where the leak improves and this has made a big difference. He has an appointment with the [facility] neurology and [facility]. His headaches have not improved very much since last year. He had an interim CT which was nonrevealing. He has a follow-up in stroke clinic in July. He follows with pulmonology as well.   The patient's allergies, current medications, family history, past medical history, past social history, past surgical history and problem list were reviewed and updated as appropriate.    Previously:   12/16/2017: (He) reports snoring and excessive daytime somnolence. I reviewed your office note from 12/01/2016. His Epworth sleepiness score is 4 out of 24 today, fatigue score is 44 out of 63. Of note, he had a overnight pulse oximetry test through pulmonology on 05/20/2016 and I reviewed the results: Average oxygen saturation was 89%, nadir was 68%. Time below or at 88% saturation was 5 hours and 3 minutes. Total valid test time was 13 hours and 2 minutes.  He was supposed to be on O2 at night, but did not use it, has now started seeing Dr. Melvyn Novas, another appt. pending.  He recently quit smoking in the summer. He drinks about 5 beers per day. Wife reports, she decided not to worry about it and that his PCP is aware. He takes Requip 1 mg qHS for RLS. He takes Norco, has taken up to 3 pills together. He is evasive about how he takes it and how many, maybe 3 times a week, 1 pill every 2-3 hours, as I understand.  He admits that he does not drink enough water. He drinks soda, about 3 per day and one cup of coffee. He has never had a sleep study. His wife indicates that  he takes multiple naps during the day. Bed time is around 9 PM. She reports, he goes back to bed after breakfast, and after lunch and one more nap maybe before bedtime. This highly underestimates his ESS. He has gained weight after he was started on antidepressant medication. He has to self catheterize at least 3 times a day. He has nocturia multiple times a night, maybe 5 times per night on average. He has had frequent morning headaches in the past month or so.  His Past Medical History Is Significant For: Past Medical History:  Diagnosis Date  . Arthritis    s/p TKR  . Bladder cancer (HCC)    [facility], [facility] low grade papillary urothileal carcinoma  . BPH (benign prostatic hyperplasia)   . Brugada syndrome    Possible Type II Brugada ECG pattern. No family history of SCD, no syncope, no tachypalpitations.  Marland Kitchen CAD (coronary artery disease)    a. LHC 1/15 - mid LCx 50 and 60%, proximal RCA 50%  . Chronic diastolic CHF (congestive heart failure) ([place])   . CKD (chronic kidney disease)   . COPD (chronic obstructive pulmonary disease) ([place])    Prior heavy smoker. PFTs (3/11): FVC 87%, FEV1 73%, ratio 0.57, DLCO 75%, TLC 121%. Moderate obstructive defect. PFTs (9/15): Only minimal obstruction, sugPrior heavy smoker. PFTs (3/11): FVC 87%, FEV1 73%, ratio 0.57, DLCO 75%, TLC 121%. Moderate obstructive defect. PFTs (9/15) minimal obstruction, poss asthma component   . Depression    with bipolar tendencies  . DOE (dyspnea on exertion)    a. Myoview 8/09- EF 57%, no ischemia // b. Myoview (3/11) EF 68%, diaphragmatic attenuation, no ischemia //  c Echo (4/11) EF 87-68%, mild diastolic dysfunction, PASP 38 mmHg // d. PFTs 9/15 minimal obstruction  /  e. CT negative for ILD  . GERD (gastroesophageal reflux disease)   . History of Doppler ultrasound    Carotid US 3/11 negative for significant stenosis  //  carotid US 6/17: Mild bilateral plaque, 1-39% ICA  . History of echocardiogram    a. Echo 12/14 Inferior  and distal septal HK, mild LVH, EF 50-55%, mild MR, mild LAE  //  b. Echo 6/17: Mild focal basal septal hypertrophy, EF 60-65%, normal wall motion, grade 1 diastolic dysfunction, mild AI  . HLD (hyperlipidemia)   . Hypertensive heart disease with CHF (congestive heart failure) ([place]) 04/08/2009  . Low testosterone   . Lung nodule    a. CT in 2015 >> PET in 12/15 not sugg of malignancy   . LVH (left ventricular hypertrophy)    a. Echo 12/14 Inferior and distal septal HK, mild LVH, EF 50-55%, mild MR, mild LAE  . Mild dementia   . Mitral regurgitation    Echo (4/11) with PISA ERO 0.3 cm^2 and regurgitant volume 41 mL (moderate MR). Echo (12/14) with mild MR.   . MS (multiple  sclerosis) ([place])   . MS (multiple sclerosis) ([place])   . OSA (obstructive sleep apnea)   . Right bundle branch block   . Stroke ([place])   . Thoracic aortic aneurysm (HCC)    4.1 cm 2017  . Urinary retention with incomplete bladder emptying    receives botox injections and treatment for BPH through [facility]    His Past Surgical History Is Significant For: Past Surgical History:  Procedure Laterality Date  . APPENDECTOMY    . EP IMPLANTABLE DEVICE N/A 06/23/2015   Procedure: Loop Recorder Insertion;  Surgeon: Deboraha Sprang, MD;  Location: [facility];  Service: Cardiovascular;  Laterality: N/A;  . HEMORRHOID SURGERY    . LEFT HEART CATHETERIZATION WITH CORONARY ANGIOGRAM N/A 01/05/2013   Procedure: LEFT HEART CATHETERIZATION WITH CORONARY ANGIOGRAM;  Surgeon: Larey Dresser, MD;  Location: [facility] CATH LAB;  Service: Cardiovascular;  Laterality: N/A;  . ROTATOR CUFF REPAIR    . TEE WITHOUT CARDIOVERSION N/A 06/23/2015   Procedure: TRANSESOPHAGEAL ECHOCARDIOGRAM (TEE);  Surgeon: Fay Records, MD;  Location: [facility] ENDOSCOPY;  Service: Cardiovascular;  Laterality: N/A;  . TONSILLECTOMY    . TOTAL KNEE ARTHROPLASTY Right   . TRANSURETHRAL RESECTION OF PROSTATE      His Family History Is Significant For: Family History   Problem Relation Age of Onset  . Stroke Father   . Stroke Mother   . Hypertension Sister   . Kidney cancer Brother   . Arthritis Brother   . Other Brother        knee problems, Bil TKR  . Hypertension Unknown        family history  . Prostate cancer Neg Hx   . Bladder Cancer Neg Hx     His Social History Is Significant For: Social History   Socioeconomic History  . Marital status: Married    Spouse name: Not on file  . Number of children: Not on file  . Years of education: Not on file  . Highest education level: Not on file  Occupational History  . Occupation: retired    Fish farm manager: RETIRED  Social Needs  . Financial resource strain: Not on file  . Food insecurity:    Worry: Not on file    Inability: Not on file  . Transportation needs:    Medical: Not on file    Non-medical: Not on file  Tobacco Use  . Smoking status: Former Smoker    Packs/day: 2.00    Years: 40.00    Pack years: 80.00    Types: Cigarettes    Last attempt to quit: 01/04/1993    Years since quitting: 24.3  . Smokeless tobacco: Former Systems developer    Types: Chew  Substance and Sexual Activity  . Alcohol use: Yes    Alcohol/week: 15.0 oz    Types: 25 Cans of beer per week    Comment: 12 pack a week  . Drug use: No  . Sexual activity: Not on file  Lifestyle  . Physical activity:    Days per week: Not on file    Minutes per session: Not on file  . Stress: Not on file  Relationships  . Social connections:    Talks on phone: Not on file    Gets together: Not on file    Attends religious service: Not on file    Active member of club or organization: Not on file    Attends meetings of clubs or organizations: Not on file  Relationship status: Not on file  Other Topics Concern  . Not on file  Social History Narrative   Retired from the [employer] after 20 years   [place] Veteran    His Allergies Are:  Allergies  Allergen Reactions  . Alcohol-Sulfur [Sulfur] Other (See Comments)    Patient  was suspected of having "Brugada Syndrome"  . Bupivacaine Other (See Comments)    Patient was suspected of having "Brugada Syndrome"  . Clomipramine Hcl Other (See Comments)    Patient was suspected of having "Brugada Syndrome"  . Flecainide Other (See Comments)    Patient was suspected of having "Brugada Syndrome"  . Lithium Other (See Comments)    Patient was suspected of having "Brugada Syndrome"  . Acetylcholine     Patient was suspected of having "Brugada Syndrome": (BrS) is a genetic condition that results in abnormal electrical activity within the heart, increasing the risk of sudden cardiac death. Those affected may have episodes of passing out.  . Amitriptyline Other (See Comments)    Patient was suspected of having "Brugada Syndrome"  . Cocaine Other (See Comments)    Patient was suspected of having "Brugada Syndrome"  . Desipramine Other (See Comments)    Patient was suspected of having "Brugada Syndrome"  . Ergonovine Other (See Comments)    Patient was suspected of having "Brugada Syndrome"  . Loxapine Other (See Comments)    Patient was suspected of having "Brugada Syndrome"  . Nortriptyline Other (See Comments)    Patient was suspected of having "Brugada Syndrome"  . Oxcarbazepine Other (See Comments)    Patient was suspected of having "Brugada Syndrome"  . Procainamide Other (See Comments)    Patient was suspected of having "Brugada Syndrome"  . Procaine Other (See Comments)    Patient was suspected of having "Brugada Syndrome"  . Propafenone Other (See Comments)    Patient was suspected of having "Brugada Syndrome"  . Propofol Other (See Comments)    Patient was suspected of having "Brugada Syndrome"  . Trifluoperazine Other (See Comments)    Patient was suspected of having "Brugada Syndrome"  :   His Current Medications Are:  Outpatient Encounter Medications as of 05/10/2017  Medication Sig  . acetaminophen (TYLENOL) 325 MG tablet Take 650 mg by mouth every 6  (six) hours as needed (for pain or headaches).   Marland Kitchen apixaban (ELIQUIS) 5 MG TABS tablet Take 1 tablet (5 mg total) by mouth 2 (two) times daily.  . ARIPiprazole (ABILIFY) 5 MG tablet Take 1 tablet (5 mg total) by mouth daily. (Patient taking differently: Take 2.5 mg by mouth 2 (two) times daily. )  . atorvastatin (LIPITOR) 40 MG tablet Take 40 mg by mouth daily.  . cholecalciferol (VITAMIN D) 400 units TABS tablet Take 400 Units by mouth daily.   . Cyanocobalamin (B-12) 1000 MCG CAPS Take 1,000 mcg by mouth daily.   Marland Kitchen doxazosin (CARDURA) 2 MG tablet TAKE 1 TABLET DAILY  . folic acid (FOLVITE) 1 MG tablet Take 1 mg by mouth daily.  . furosemide (LASIX) 40 MG tablet TAKE 1 TABLET(40 MG) BY MOUTH DAILY  . gabapentin (NEURONTIN) 100 MG capsule Take 200 mg by mouth 2 (two) times daily.  Marland Kitchen HYDROcodone-acetaminophen (NORCO) 10-325 MG tablet Take 1 tablet by mouth every 6 (six) hours as needed for moderate pain or severe pain (DX: G89.4).  Marland Kitchen MAGNESIUM CITRATE PO Take 0.5-1 Bottles by mouth once as needed (for mild constipation).   . metoprolol succinate (TOPROL-XL) 100 MG  24 hr tablet Take 1 1/2 tablets (150 mg total) by mouth daily.  . pantoprazole (PROTONIX) 40 MG tablet TAKE 1 TABLET DAILY  . PROAIR HFA 108 (90 Base) MCG/ACT inhaler USE 1 TO 2 INHALATIONS EVERY 6 HOURS AS NEEDED FOR WHEEZING OR SHORTNESS OF BREATH  . rOPINIRole (REQUIP) 1 MG tablet TAKE 1 TABLET AT BEDTIME  . Tiotropium Bromide Monohydrate (SPIRIVA RESPIMAT) 2.5 MCG/ACT AERS Inhale 2 puffs into the lungs daily.  Marland Kitchen venlafaxine XR (EFFEXOR-XR) 150 MG 24 hr capsule TAKE 1 CAPSULE TWICE A DAY  . [DISCONTINUED] gabapentin (NEURONTIN) 100 MG capsule Take 1 capsule (100 mg total) by mouth 3 (three) times daily.  Marland Kitchen losartan (COZAAR) 100 MG tablet Take 1 tablet (100 mg total) by mouth daily.   No facility-administered encounter medications on file as of 05/10/2017.   :  Review of Systems:  Out of a complete 14 point review of systems, all  are reviewed and negative with the exception of these symptoms as listed below: Review of Systems  Neurological:       Patient had a lot of trouble with the machine at first, but wife mentions that the last few nights have been a lot better. Patient's wife also mentioned that patient has daily severe headaches.     Objective:  Neurological Exam  Physical Exam Physical Examination:   Vitals:   05/10/17 1111  BP: (!) 173/97  Pulse: 93   General Examination: The patient is a very pleasant 77 y.o. male in no acute distress. He appears well-developed and well-nourished and well groomed.   HEENT: Normocephalic, atraumatic, pupils are equal, round and reactive to light and accommodation. Extraocular tracking is good without limitation to gaze excursion or nystagmus noted. Normal smooth pursuit is noted. Hearing is quite impaired, face is symmetric with normal facial animation and normal facial sensation. Speech is dysarthric. There is no lip, neck/head, jaw or voice tremor. Oropharynx exam reveals: moderate mouth dryness, adequate dental hygiene and moderate airway crowding.  Chest: Clear to auscultation without wheezing, rhonchi or crackles noted.  Heart: S1+S2+0, regular and normal without murmurs, rubs or gallops noted.   Abdomen: Soft, non-tender and non-distended with normal bowel sounds appreciated on auscultation.  Extremities: There is trace pitting edema in the distal lower extremities bilaterally.   Skin: Warm and dry without trophic changes noted.  Musculoskeletal: exam reveals no obvious joint deformities, tenderness or joint swelling or erythema.   Neurologically:  Mental status: The patient is awake, alert and oriented in all 4 spheres. His immediate and remote memory, attention, language skills and fund of knowledge are fairly appropriate. There is no evidence of aphasia, agnosia, apraxia or anomia. Speech is dysarthric. Affect is normal.  Cranial nerves II - XII are  as described above under HEENT exam. In addition: shoulder shrug is normal with equal shoulder height noted. Motor exam: Normal bulk, strength and tone is noted. There is no drift, tremor or rebound. Reflexes are 1+ in the upper extremities and diminished in the lower extremities.  Cerebellar testing: No dysmetria or intention tremor. There is no truncal or gait ataxia.  Sensory exam: intact to light touch in the upper and lower extremities.  Gait, station and balance: He stands without difficulty. No veering to one side is noted. No leaning to one side is noted. Posture is age-appropriate and stance is narrow based. Gait shows normal stride length and normal pace. No problems turning are noted.  Assessment and Plan:   In summary, [name] Schor  [name] is a very pleasant 77 y.o.-year old male with an underlying complex medical history of right MCA stroke in June 2017, thoracic aortic aneurysm, coronary artery disease, diastolic congestive heart failure, hypertension, hyperlipidemia, reflux disease, urethral cancer, recurrent UTI, depression, memory loss and obesity, who presents for follow-up consultation of his obstructive sleep apnea after sleep study testing and starting CPAP therapy. His split-night sleep study from 02/16/2017 showed severe obstructive sleep apnea, he did quite well on CPAP therapy. He is noncompliant with treatment after initial difficulty and his leak has also improved lately after his wife helped him tighten the mask at night. He is compliant with treatment and commended for this, he indicates improved sleep quality. Wife has noted improved sleep consolidation. We talked about his sleep study results in detail today and reviewed his compliance data together. I explained the importance of being compliant with PAP treatment, not only for insurance purposes but primarily to improve His symptoms, and for the patient's long term health benefit, including to reduce His cardiovascular risks.I  suggested we increase his CPAP pressure from 8 cm to 9 cm at this time to optimize treatment at this residual AHI for the past month has been 10.5 per hour. He is in agreement. He is advised to follow-up routinely with one of our nurse practitioners for sleep apnea recheck in 6 months and then hopefully yearly thereafter. I answered all their questions today and the patient and his wife were in agreement. I spent 25 minutes in total face-to-face time with the patient, more than 50% of which was spent in counseling and coordination of care, reviewing test results, reviewing medication and discussing or reviewing the diagnosis of OSA, its prognosis and treatment options. Pertinent laboratory and imaging test results that were available during this visit with the patient were reviewed by me and considered in my medical decision making (see chart for details).

## 2017-05-13 ENCOUNTER — Telehealth: Payer: Self-pay | Admitting: Physician Assistant

## 2017-05-13 NOTE — Telephone Encounter (Signed)
[facility] that [facility] could have an MRI with his ILR. Pat verbalized understanding and plans to call patient.

## 2017-05-13 NOTE — Telephone Encounter (Signed)
New Message:       [facility] is calling to see if it is okay for pt to have and MRI of the head with a loop recorder

## 2017-05-13 NOTE — Telephone Encounter (Signed)
LVM for call back

## 2017-05-19 ENCOUNTER — Telehealth: Payer: Self-pay | Admitting: *Deleted

## 2017-05-19 NOTE — Telephone Encounter (Signed)
Pt's wife called request sleep study sent to [place] in [place]. She is requesting a copy also  THank you

## 2017-05-23 NOTE — Telephone Encounter (Signed)
error 

## 2017-05-24 LAB — CUP PACEART REMOTE DEVICE CHECK
Date Time Interrogation Session: 20190422183737
MDC IDC PG IMPLANT DT: 20170619

## 2017-05-31 ENCOUNTER — Ambulatory Visit (INDEPENDENT_AMBULATORY_CARE_PROVIDER_SITE_OTHER): Payer: Medicare Other | Admitting: *Deleted

## 2017-05-31 DIAGNOSIS — I639 Cerebral infarction, unspecified: Secondary | ICD-10-CM

## 2017-05-31 NOTE — Progress Notes (Signed)
Carelink Summary Report / Loop Recorder 

## 2017-06-01 ENCOUNTER — Other Ambulatory Visit: Payer: Self-pay | Admitting: Family Medicine

## 2017-06-01 NOTE — Telephone Encounter (Signed)
Patient is requesting a refill on Hydrocodone   LOV: 05/02/17 LRF:  03/28/17

## 2017-06-01 NOTE — Telephone Encounter (Signed)
Pt needs refill on hydrocodone to [facility] [address]

## 2017-06-02 ENCOUNTER — Encounter: Payer: Self-pay | Admitting: Internal Medicine

## 2017-06-02 ENCOUNTER — Ambulatory Visit (INDEPENDENT_AMBULATORY_CARE_PROVIDER_SITE_OTHER): Payer: Medicare Other | Admitting: Internal Medicine

## 2017-06-02 VITALS — BP 132/76 | HR 83 | Ht 71.0 in | Wt 238.0 lb

## 2017-06-02 DIAGNOSIS — E669 Obesity, unspecified: Secondary | ICD-10-CM

## 2017-06-02 DIAGNOSIS — I110 Hypertensive heart disease with heart failure: Secondary | ICD-10-CM | POA: Diagnosis not present

## 2017-06-02 DIAGNOSIS — J449 Chronic obstructive pulmonary disease, unspecified: Secondary | ICD-10-CM | POA: Diagnosis not present

## 2017-06-02 DIAGNOSIS — I639 Cerebral infarction, unspecified: Secondary | ICD-10-CM

## 2017-06-02 LAB — PULMONARY FUNCTION TEST
DL/VA % PRED: 77 %
DL/VA: 3.53 ml/min/mmHg/L
DLCO unc % pred: 56 %
DLCO unc: 17.8 ml/min/mmHg
FEF 25-75 PRE: 0.75 L/s
FEF2575-%PRED-PRE: 35 %
FEV1-%PRED-PRE: 48 %
FEV1-PRE: 1.43 L
FEV1FVC-%PRED-PRE: 91 %
FEV6-%Pred-Pre: 55 %
FEV6-PRE: 2.15 L
FEV6FVC-%PRED-PRE: 104 %
FVC-%PRED-PRE: 52 %
FVC-PRE: 2.18 L
Pre FEV1/FVC ratio: 66 %
Pre FEV6/FVC Ratio: 99 %
RV % pred: 131 %
RV: 3.33 L
TLC % pred: 88 %
TLC: 6.12 L

## 2017-06-02 MED ORDER — HYDROCODONE-ACETAMINOPHEN 10-325 MG PO TABS: 1.0000 | ORAL_TABLET | Freq: Four times a day (QID) | ORAL | 0 refills | Status: DC | PRN

## 2017-06-02 MED ORDER — TIOTROPIUM BROMIDE-OLODATEROL 2.5-2.5 MCG/ACT IN AERS: 2.0000 | INHALATION_SPRAY | Freq: Every day | RESPIRATORY_TRACT | 11 refills | Status: DC

## 2017-06-02 NOTE — Patient Instructions (Addendum)
Plan A = Automatic =  Change to stiolto 2 pffs each am in place of spriva to see if you note additional activity tolerance and if not just resume the spiriva as before   Plan B = Backup Only use your albuterol as a rescue medication to be used if you can't catch your breath by resting or doing a relaxed purse lip breathing pattern.  - The less you use it, the better it will work when you need it. - Ok to use the inhaler up to 2 puffs  every 4 hours if you must but call for appointment if use goes up over your usual need - Don't leave home without it !!  (think of it like the spare tire for your car)    Return if breathing worse or need more albuterol than you do  Now but may need to change your blood pressure pill to bisoprolol rather than add additional medications if you are not happy with your breathing    Losing weight will probably help your breathing more than additional medications at this point    If you are satisfied with your treatment plan,  let your doctor know and he/she can either refill your medications or you can return here when your prescription runs out.     If in any way you are not 100% satisfied,  please tell us.  If 100% better, tell your friends!  Pulmonary follow up is as needed

## 2017-06-02 NOTE — Progress Notes (Signed)
Subjective:     Patient ID: [name] [name], male   DOB: [birth year],  .   MRN: [id number]    07/06/2016  f/u ov/[facility] Start re: unexplained sob/ does not meet criteria for copd s/p smoking cessation in 1995 Chief Complaint  Patient presents with  . Follow-up    Pt states here to qualify for ambulatory o2. His breathing is unchanged since his last visit with Dr Chase Caller. He uses his albuterol inhaler 2 x per wk on average.    doe is highly variable and could not be reproduced today during ov.  No rest or noct sob but "most days is sob across the room" and can't walk to end of street s stopping multiples times s assoc cp.  Not able to use hfa effectively / maint on spriva dpi  rec Try spiriva 2 puffs each am in place of the powder  Work on inhaler technique:   Only use your albuterol as a rescue  We will track down your 02 at night but you do not need it during the day at present     02/15/2017  f/u ov/[name] [name] re:  GOLD 0 copd  Chief Complaint  Patient presents with  . Follow-up    Breathing is unchanged since the last visit. He is using his albuterol inhaler 3-4 x per day.    Dyspnea:  Across the room is a struggle  (not reproducible at ov - see a/p)/ noted uses saba p over does it and never tries it prior to activity that he knows causes sob  Cough: no Sleep: 2lpm and a couple pillows  rec After a few weeks stop spiriva and see if you note a difference in your exercise performance(like high octane fuel)  Only use your albuterol as a rescue medication  Please remember to go to the  x-ray department downstairs in the basement  for your tests - we will call you with the results when they are available. Weight control is simply a matter of calorie balance    06/02/2017  f/u ov/[name] [name] re:  Copd GOLD III but restrictive changes predominate Chief Complaint  Patient presents with  . Follow-up    follow up COPD. breathing unchanged  Dyspnea:  No change struggle to do more than [employer] = MMRC3 =  can't walk 100 yards even at a slow pace at a flat grade s stopping due to sob   Cough: none Sleep: ok on cpap  SABA use:   Rarely perceives need   No obvious day to day or daytime variability or assoc excess/ purulent sputum or mucus plugs or hemoptysis or cp or chest tightness, subjective wheeze or overt sinus or hb symptoms. No unusual exposure hx or h/o childhood pna/ asthma or knowledge of premature birth.  Sleeping  Ok on cpap   without nocturnal  or early am exacerbation  of respiratory  c/o's or need for noct saba. Also denies any obvious fluctuation of symptoms with weather or environmental changes or other aggravating or alleviating factors except as outlined above   Current Allergies, Complete Past Medical History, Past Surgical History, Family History, and Social History were reviewed in [employer] record.  ROS  The following are not active complaints unless bolded Hoarseness, sore throat, dysphagia, dental problems, itching, sneezing,  nasal congestion or discharge of excess mucus or purulent secretions, ear ache,   fever, chills, sweats, unintended wt loss or wt gain, classically pleuritic or exertional cp,  orthopnea pnd or arm/hand  swelling  or leg swelling, presyncope, palpitations, abdominal pain, anorexia, nausea, vomiting, diarrhea  or change in bowel habits or change in bladder habits, change in stools or change in urine, dysuria, hematuria,  rash, arthralgias, visual complaints, headache, numbness, weakness or ataxia or problems with walking or coordination,  change in mood or  memory.        Current Meds  Medication Sig  . acetaminophen (TYLENOL) 325 MG tablet Take 650 mg by mouth every 6 (six) hours as needed (for pain or headaches).   Marland Kitchen apixaban (ELIQUIS) 5 MG TABS tablet Take 1 tablet (5 mg total) by mouth 2 (two) times daily.  . ARIPiprazole (ABILIFY) 5 MG tablet Take 1 tablet (5 mg total) by mouth daily. (Patient taking differently: Take 2.5 mg  by mouth 2 (two) times daily. )  . atorvastatin (LIPITOR) 40 MG tablet Take 40 mg by mouth daily.  . cholecalciferol (VITAMIN D) 400 units TABS tablet Take 400 Units by mouth daily.   . Cyanocobalamin (B-12) 1000 MCG CAPS Take 1,000 mcg by mouth daily.   Marland Kitchen doxazosin (CARDURA) 2 MG tablet TAKE 1 TABLET DAILY  . folic acid (FOLVITE) 1 MG tablet Take 1 mg by mouth daily.  . furosemide (LASIX) 40 MG tablet TAKE 1 TABLET(40 MG) BY MOUTH DAILY  . gabapentin (NEURONTIN) 100 MG capsule Take 200 mg by mouth 2 (two) times daily.  Marland Kitchen HYDROcodone-acetaminophen (NORCO) 10-325 MG tablet Take 1 tablet by mouth every 6 (six) hours as needed for moderate pain or severe pain (DX: G89.4, (G35 - MS)).  Marland Kitchen MAGNESIUM CITRATE PO Take 0.5-1 Bottles by mouth once as needed (for mild constipation).   . metoprolol succinate (TOPROL-XL) 100 MG 24 hr tablet Take 1 1/2 tablets (150 mg total) by mouth daily.  . pantoprazole (PROTONIX) 40 MG tablet TAKE 1 TABLET DAILY  . PROAIR HFA 108 (90 Base) MCG/ACT inhaler USE 1 TO 2 INHALATIONS EVERY 6 HOURS AS NEEDED FOR WHEEZING OR SHORTNESS OF BREATH  . rOPINIRole (REQUIP) 1 MG tablet TAKE 1 TABLET AT BEDTIME  . venlafaxine XR (EFFEXOR-XR) 150 MG 24 hr capsule TAKE 1 CAPSULE TWICE A DAY  . [ Tiotropium Bromide Monohydrate (SPIRIVA RESPIMAT) 2.5 MCG/ACT AERS Inhale 2 puffs into the lungs daily.                   Objective:   Physical Exam   Obese stoic wm nad   06/02/2017       238   02/15/2017      243   07/06/16 210 lb (95.3 kg)  06/25/16 214 lb (97.1 kg)  06/21/16 215 lb 9.8 oz (97.8 kg)     Vital signs reviewed - Note on arrival 02 sats  96% on RA   HEENT: nl dentition, turbinates bilaterally, and oropharynx. Nl external ear canals without cough reflex   NECK :  without JVD/Nodes/TM/ nl carotid upstrokes bilaterally   LUNGS: no acc muscle use,  Nl contour chest with distant bs bilaterally without cough on insp or exp maneuvers   CV:  RRR  no s3 or murmur or  increase in P2, and no edema   ABD:   Tensely obese with limited inspiratory excursion in the supine position. No bruits or organomegaly appreciated, bowel sounds nl  MS:  Nl gait/ ext warm without deformities, calf tenderness, cyanosis or clubbing No obvious joint restrictions   SKIN: warm and dry without lesions    NEURO:  alert, approp, nl sensorium with  no motor or cerebellar deficits apparent.                               Assessment:

## 2017-06-02 NOTE — Progress Notes (Signed)
PFT completed 06/02/17

## 2017-06-03 ENCOUNTER — Encounter: Payer: Self-pay | Admitting: Internal Medicine

## 2017-06-03 ENCOUNTER — Telehealth: Payer: Self-pay | Admitting: Cardiology

## 2017-06-03 DIAGNOSIS — E669 Obesity, unspecified: Secondary | ICD-10-CM | POA: Insufficient documentation

## 2017-06-03 NOTE — Telephone Encounter (Signed)
Spoke w/ pt wife and requested that he send a manual transmission b/c his home monitor has not updated in at least 14 days.   

## 2017-06-03 NOTE — Assessment & Plan Note (Signed)
ERV 37% at pfts 06/02/2017   Reviewed impact of wt gain on lung volumes

## 2017-06-03 NOTE — Assessment & Plan Note (Signed)
Given airflow obst In the setting of respiratory symptoms of unknown etiology,  It would be preferable to high dose lopressor  to use bystolic, the most beta -1  selective Beta blocker available in sample form, with bisoprolol the most selective generic choice  on the market, at least on a trial basis, to make sure the spillover Beta 2 effects of the less specific Beta blockers are not contributing to this patient's symptoms.

## 2017-06-03 NOTE — Assessment & Plan Note (Addendum)
PFT 09/04/2013  FEV1 72%, ratio 68, FVC 77%, no sign BD response, decreased diffusing capacity at 66%.  Decreased Mid flows 53% w/ +BD response.  - PFT's  06/18/2016  FEV1 1.96 (64 % ) ratio 71   p 6 % improvement from saba p ? prior to study with DLCO  63/69 % corrects to 88 % for alv volume   - 07/06/2016  Walked RA x 4 laps @ 185 ft each stopped due to  End of study, nl pace, no desats or sob  - 02/15/2017  Walked RA x 3 laps @ 185 ft each stopped due to endo of study, no desat, min sob      - PFT's  06/02/2017  FEV1 1.43 (48 % ) ratio 66  p spiriva  prior to study with DLCO  56 % corrects to 72  % for alv volume  And erv 37%  - not feeling up to post saba study - 06/02/2017  After extensive coaching inhaler device  effectiveness =    90% with smi > change to stiolto 2 each am    techically now gold III and Pt is Group B in terms of symptom/risk and laba/lama therefore appropriate rx at this point though much of his problem is related to wt gain/ advised    I had an extended discussion with the patient and wife reviewing all relevant studies completed to date and  lasting 15 to 20 minutes of a 25 minute visit    See device teaching which extended face to face time for this visit.  Each maintenance medication was reviewed in detail including emphasizing most importantly the difference between maintenance and prns and under what circumstances the prns are to be triggered using an action plan format that is not reflected in the computer generated alphabetically organized AVS which I have not found useful in most complex patients, especially with respiratory illnesses  Please see AVS for specific instructions unique to this visit that I personally wrote and verbalized to the the pt in detail and then reviewed with pt  by my nurse highlighting any  changes in therapy recommended at today's visit to their plan of care.

## 2017-06-16 ENCOUNTER — Other Ambulatory Visit: Payer: Medicare Other | Admitting: *Deleted

## 2017-06-16 ENCOUNTER — Telehealth: Payer: Self-pay | Admitting: Cardiology

## 2017-06-16 ENCOUNTER — Other Ambulatory Visit: Payer: Medicare Other

## 2017-06-16 DIAGNOSIS — I5032 Chronic diastolic (congestive) heart failure: Secondary | ICD-10-CM | POA: Diagnosis not present

## 2017-06-16 NOTE — Telephone Encounter (Signed)
Spoke w/ pt wife and requested that he send a manual transmission b/c his home monitor has not updated in at least 14 days.   

## 2017-06-17 ENCOUNTER — Telehealth: Payer: Self-pay | Admitting: *Deleted

## 2017-06-17 LAB — BASIC METABOLIC PANEL
BUN / CREAT RATIO: 15 (ref 10–24)
BUN: 18 mg/dL (ref 8–27)
CO2: 22 mmol/L (ref 20–29)
CREATININE: 1.22 mg/dL (ref 0.76–1.27)
Calcium: 9.4 mg/dL (ref 8.6–10.2)
Chloride: 101 mmol/L (ref 96–106)
GFR calc Af Amer: 67 mL/min/{1.73_m2} (ref >59)
GFR calc non Af Amer: 58 mL/min/{1.73_m2} — ABNORMAL LOW (ref >59)
GLUCOSE: 122 mg/dL — AB (ref 65–99)
POTASSIUM: 4.2 mmol/L (ref 3.5–5.2)
SODIUM: 140 mmol/L (ref 134–144)

## 2017-06-17 NOTE — Telephone Encounter (Signed)
-----   Message from Liliane Shi, [place] sent at 06/17/2017  8:15 AM EDT ----- Renal function, potassium normal. Continue current medications and follow up as planned.  Richardson Dopp, PA-C    06/17/2017 8:14 AM

## 2017-06-17 NOTE — Telephone Encounter (Signed)
DPR ok to lmom. Lmom lab work ok, K+ and kidney function normal. No changes in medications at this time.

## 2017-06-21 ENCOUNTER — Encounter: Payer: Self-pay | Admitting: Cardiology

## 2017-06-22 LAB — CUP PACEART REMOTE DEVICE CHECK
Date Time Interrogation Session: 20190525183646
MDC IDC PG IMPLANT DT: 20170619

## 2017-06-23 ENCOUNTER — Ambulatory Visit (INDEPENDENT_AMBULATORY_CARE_PROVIDER_SITE_OTHER)
Admission: RE | Admit: 2017-06-23 | Discharge: 2017-06-23 | Disposition: A | Discharge status: Home / Self Care | Payer: Medicare Other | Source: Ambulatory Visit | Attending: Physician Assistant | Admitting: Physician Assistant

## 2017-06-23 DIAGNOSIS — I712 Thoracic aortic aneurysm, without rupture, unspecified: Secondary | ICD-10-CM

## 2017-06-23 MED ORDER — IOPAMIDOL (ISOVUE-370) INJECTION 76%
100.0000 mL | Freq: Once | INTRAVENOUS | Status: AC | PRN
  Administered 2017-06-23: 100 mL via INTRAVENOUS

## 2017-06-24 ENCOUNTER — Encounter: Payer: Self-pay | Admitting: Physician Assistant

## 2017-06-27 ENCOUNTER — Other Ambulatory Visit: Payer: Self-pay | Admitting: Family Medicine

## 2017-06-27 ENCOUNTER — Other Ambulatory Visit: Payer: Self-pay | Admitting: Physician Assistant

## 2017-06-29 ENCOUNTER — Ambulatory Visit: Payer: Medicare Other | Admitting: Physician Assistant

## 2017-06-30 ENCOUNTER — Ambulatory Visit (INDEPENDENT_AMBULATORY_CARE_PROVIDER_SITE_OTHER): Payer: Medicare Other | Admitting: *Deleted

## 2017-06-30 DIAGNOSIS — I639 Cerebral infarction, unspecified: Secondary | ICD-10-CM | POA: Diagnosis not present

## 2017-07-01 ENCOUNTER — Other Ambulatory Visit: Payer: Self-pay

## 2017-07-01 ENCOUNTER — Ambulatory Visit (INDEPENDENT_AMBULATORY_CARE_PROVIDER_SITE_OTHER): Payer: Medicare Other | Admitting: Family Medicine

## 2017-07-01 ENCOUNTER — Encounter: Payer: Self-pay | Admitting: Family Medicine

## 2017-07-01 VITALS — BP 136/74 | HR 80 | Temp 98.0°F | Resp 16 | Ht 70.0 in | Wt 237.0 lb

## 2017-07-01 DIAGNOSIS — R0602 Shortness of breath: Secondary | ICD-10-CM | POA: Diagnosis not present

## 2017-07-01 DIAGNOSIS — I639 Cerebral infarction, unspecified: Secondary | ICD-10-CM | POA: Diagnosis not present

## 2017-07-01 DIAGNOSIS — K120 Recurrent oral aphthae: Secondary | ICD-10-CM | POA: Diagnosis not present

## 2017-07-01 LAB — COMPLETE METABOLIC PANEL WITH GFR
AG Ratio: 1.6 (calc) (ref 1.0–2.5)
ALBUMIN MSPROF: 4.4 g/dL (ref 3.6–5.1)
ALKALINE PHOSPHATASE (APISO): 88 U/L (ref 40–115)
ALT: 23 U/L (ref 9–46)
AST: 28 U/L (ref 10–35)
BILIRUBIN TOTAL: 0.4 mg/dL (ref 0.2–1.2)
BUN/Creatinine Ratio: 19 (calc) (ref 6–22)
BUN: 25 mg/dL (ref 7–25)
CHLORIDE: 103 mmol/L (ref 98–110)
CO2: 26 mmol/L (ref 20–32)
Calcium: 8.9 mg/dL (ref 8.6–10.3)
Creat: 1.35 mg/dL — ABNORMAL HIGH (ref 0.70–1.18)
GFR, Est African American: 59 mL/min/{1.73_m2} — ABNORMAL LOW (ref >=60)
GFR, Est Non African American: 51 mL/min/{1.73_m2} — ABNORMAL LOW (ref >=60)
GLUCOSE: 140 mg/dL — AB (ref 65–99)
Globulin: 2.8 g/dL (calc) (ref 1.9–3.7)
Potassium: 4.2 mmol/L (ref 3.5–5.3)
Sodium: 140 mmol/L (ref 135–146)
Total Protein: 7.2 g/dL (ref 6.1–8.1)

## 2017-07-01 LAB — CBC WITH DIFFERENTIAL/PLATELET
BASOS ABS: 72 {cells}/uL (ref 0–200)
Basophils Relative: 1.2 %
EOS ABS: 450 {cells}/uL (ref 15–500)
EOS PCT: 7.5 %
HCT: 40.9 % (ref 38.5–50.0)
Hemoglobin: 13.8 g/dL (ref 13.2–17.1)
Lymphs Abs: 1188 cells/uL (ref 850–3900)
MCH: 32.5 pg (ref 27.0–33.0)
MCHC: 33.7 g/dL (ref 32.0–36.0)
MCV: 96.5 fL (ref 80.0–100.0)
MONOS PCT: 9.7 %
MPV: 10.8 fL (ref 7.5–12.5)
NEUTROS ABS: 3708 {cells}/uL (ref 1500–7800)
Neutrophils Relative %: 61.8 %
PLATELETS: 233 10*3/uL (ref 140–400)
RBC: 4.24 10*6/uL (ref 4.20–5.80)
RDW: 13.1 % (ref 11.0–15.0)
TOTAL LYMPHOCYTE: 19.8 %
WBC mixed population: 582 cells/uL (ref 200–950)
WBC: 6 10*3/uL (ref 3.8–10.8)

## 2017-07-01 LAB — VITAMIN B12: VITAMIN B 12: 453 pg/mL (ref 200–1100)

## 2017-07-01 MED ORDER — LIDOCAINE VISCOUS HCL 2 % MT SOLN: 1.0000 mL | Freq: Four times a day (QID) | OROMUCOSAL | 0 refills | Status: DC | PRN

## 2017-07-01 MED ORDER — SUCRALFATE 1 GM/10ML PO SUSP: 1.0000 g | Freq: Three times a day (TID) | ORAL | 0 refills | Status: DC

## 2017-07-01 NOTE — Progress Notes (Signed)
Patient ID: [name] [name], male    DOB: 09-16-1940, 77 y.o.   MRN: [id number]  PCP: [name], MD  Chief Complaint  Patient presents with  . Mouth Lesions  . about metoprolol    Subjective:   [name] [name] is a 77 y.o. male, presents to clinic with CC of mouth sores to left tongue for 1-2 months since getting dental work done.  It is a small erythematous bump on the side and under the left tongue, states it is irritating, with mild pain.  Has not improved in 1-2 months.  Since having dental work there multiple time, he continues to rub his tongue against the new crown.   No change in size, no bleeding.  Sore has not spread anywhere.  His mouth is chronically dry due to his medicine his wife states.  And no improvement with hydration mouthwashes.  He has hx of oral sores chronically that resolved nearly 8 months ago when he started supplementing B12.  No hx of HSV.  No other alleviating or aggravating factors. Pt and his wife also report 2 years of worsening Sob at rest and with exertion that has not changed acutely.  He does have OSA, HTN, CAD, CHF, HTN, hx of stroke, Obesity, COPD.  His exertional and baseline dyspnea continues to be concerning to the pt and his wife.  No cough, near syncope, CP.  Patient Active Problem List   Diagnosis Date Noted  . Obesity (BMI 30-39.9) 06/03/2017  . History of embolic stroke 32/67/1245  . OSA (obstructive sleep apnea) 12/01/2016  . Morning headache 12/01/2016  . Urethral cancer ([place])   . Cryptogenic stroke ([place]) 06/21/2016  . Expressive aphasia   . Essential hypertension 05/17/2016  . Family history of stroke 04/06/2016  . Cognitive impairment 04/06/2016  . Syncope and collapse   . Dysarthria   . Acute cystitis without hematuria   . History of stroke   . Slurred speech 06/20/2015  . AKI (acute kidney injury) ([place]) 06/20/2015  . Thoracic aortic aneurysm ([place])   . Hyperlipidemia LDL goal <70 06/05/2015  . Gastroesophageal  reflux disease without esophagitis 06/05/2015  . COPD  GOLD III 09/04/2013  . Lung nodule 09/04/2013  . Cancer ([place])   . Coronary artery disease involving native coronary artery of native heart without angina pectoris 02/01/2013  . Chronic diastolic CHF (congestive heart failure) ([place]) 01/03/2013  . Depression   . Urinary retention with incomplete bladder emptying   . BPH (benign prostatic hyperplasia)   . Brugada syndrome 08/23/2011  . Hypertensive heart disease with CHF (congestive heart failure) ([place]) 04/08/2009  . Mitral valve disorder 04/08/2009  . CAROTID BRUIT, RIGHT 03/06/2009  . SHORTNESS OF BREATH 03/06/2009  . OTHER CHEST PAIN 03/06/2009  . MS (multiple sclerosis) ([place]) 10/11/2006     Prior to Admission medications   Medication Sig Start Date End Date Taking? Authorizing Provider  acetaminophen (TYLENOL) 325 MG tablet Take 650 mg by mouth every 6 (six) hours as needed (for pain or headaches).    Yes [provider]  ARIPiprazole (ABILIFY) 5 MG tablet Take 1 tablet (5 mg total) by mouth daily. Patient taking differently: Take 2.5 mg by mouth 2 (two) times daily.  12/23/16  Yes [name], MD  atorvastatin (LIPITOR) 40 MG tablet Take 40 mg by mouth daily.   Yes [provider]  atorvastatin (LIPITOR) 40 MG tablet TAKE 1 TABLET DAILY 06/28/17  Yes Richardson Dopp T, PA-C  cholecalciferol (  VITAMIN D) 400 units TABS tablet Take 400 Units by mouth daily.    Yes [provider]  Cyanocobalamin (B-12) 1000 MCG CAPS Take 1,000 mcg by mouth daily.    Yes [provider]  doxazosin (CARDURA) 2 MG tablet TAKE 1 TABLET DAILY 03/10/17  Yes [name], MD  ELIQUIS 5 MG TABS tablet TAKE 1 TABLET TWICE A DAY 06/28/17  Yes [name], MD  folic acid (FOLVITE) 1 MG tablet Take 1 mg by mouth daily.   Yes [provider]  furosemide (LASIX) 40 MG tablet TAKE 1 TABLET(40 MG) BY MOUTH DAILY 12/16/16  Yes End, Harrell Gave, MD  gabapentin  (NEURONTIN) 100 MG capsule Take 200 mg by mouth 2 (two) times daily. Patient taking 300mg  twice daily   Yes [provider]  HYDROcodone-acetaminophen (NORCO) 10-325 MG tablet Take 1 tablet by mouth every 6 (six) hours as needed for moderate pain or severe pain (DX: G89.4, (G35 - MS)). 06/02/17  Yes [name], MD  MAGNESIUM CITRATE PO Take 0.5-1 Bottles by mouth once as needed (for mild constipation).    Yes [provider]  metoprolol succinate (TOPROL-XL) 100 MG 24 hr tablet Take 1 1/2 tablets (150 mg total) by mouth daily. 03/18/17  Yes End, Harrell Gave, MD  pantoprazole (PROTONIX) 40 MG tablet TAKE 1 TABLET DAILY 02/14/17  Yes [name], MD  PROAIR HFA 108 [phone] Base) MCG/ACT inhaler USE 1 TO 2 INHALATIONS EVERY 6 HOURS AS NEEDED FOR WHEEZING OR SHORTNESS OF BREATH 10/17/15  Yes [name], MD  rOPINIRole (REQUIP) 1 MG tablet TAKE 1 TABLET AT BEDTIME 11/17/15  Yes [name], MD  Tiotropium Bromide-Olodaterol (STIOLTO RESPIMAT) 2.5-2.5 MCG/ACT AERS Inhale 2 puffs into the lungs daily. 06/02/17  Yes Tanda Rockers, MD  venlafaxine XR (EFFEXOR-XR) 150 MG 24 hr capsule TAKE 1 CAPSULE TWICE A DAY 10/02/15  Yes [name], MD  losartan (COZAAR) 100 MG tablet Take 1 tablet (100 mg total) by mouth daily. 08/18/16 03/16/17  Richardson Dopp T, PA-C     Allergies  Allergen Reactions  . Alcohol-Sulfur [Sulfur] Other (See Comments)    Patient was suspected of having "Brugada Syndrome"  . Bupivacaine Other (See Comments)    Patient was suspected of having "Brugada Syndrome"  . Clomipramine Hcl Other (See Comments)    Patient was suspected of having "Brugada Syndrome"  . Flecainide Other (See Comments)    Patient was suspected of having "Brugada Syndrome"  . Lithium Other (See Comments)    Patient was suspected of having "Brugada Syndrome"  . Acetylcholine     Patient was suspected of having "Brugada Syndrome": (BrS) is a genetic condition that results in  abnormal electrical activity within the heart, increasing the risk of sudden cardiac death. Those affected may have episodes of passing out.  . Amitriptyline Other (See Comments)    Patient was suspected of having "Brugada Syndrome"  . Cocaine Other (See Comments)    Patient was suspected of having "Brugada Syndrome"  . Desipramine Other (See Comments)    Patient was suspected of having "Brugada Syndrome"  . Ergonovine Other (See Comments)    Patient was suspected of having "Brugada Syndrome"  . Loxapine Other (See Comments)    Patient was suspected of having "Brugada Syndrome"  . Nortriptyline Other (See Comments)    Patient was suspected of having "Brugada Syndrome"  . Oxcarbazepine Other (See Comments)    Patient was suspected of having "Brugada Syndrome"  .  Procainamide Other (See Comments)    Patient was suspected of having "Brugada Syndrome"  . Procaine Other (See Comments)    Patient was suspected of having "Brugada Syndrome"  . Propafenone Other (See Comments)    Patient was suspected of having "Brugada Syndrome"  . Propofol Other (See Comments)    Patient was suspected of having "Brugada Syndrome"  . Trifluoperazine Other (See Comments)    Patient was suspected of having "Brugada Syndrome"     Family History  Problem Relation Age of Onset  . Stroke Father   . Stroke Mother   . Hypertension Sister   . Kidney cancer Brother   . Arthritis Brother   . Other Brother        knee problems, Bil TKR  . Hypertension Unknown        family history  . Prostate cancer Neg Hx   . Bladder Cancer Neg Hx      Social History   Socioeconomic History  . Marital status: Married    Spouse name: Not on file  . Number of children: Not on file  . Years of education: Not on file  . Highest education level: Not on file  Occupational History  . Occupation: retired    Fish farm manager: RETIRED  Social Needs  . Financial resource strain: Not on file  . Food insecurity:    Worry: Not on  file    Inability: Not on file  . Transportation needs:    Medical: Not on file    Non-medical: Not on file  Tobacco Use  . Smoking status: Former Smoker    Packs/day: 2.00    Years: 40.00    Pack years: 80.00    Types: Cigarettes    Last attempt to quit: 01/04/1993    Years since quitting: 24.5  . Smokeless tobacco: Former Systems developer    Types: Chew  Substance and Sexual Activity  . Alcohol use: Yes    Alcohol/week: 15.0 oz    Types: 25 Cans of beer per week    Comment: 12 pack a week  . Drug use: No  . Sexual activity: Not on file  Lifestyle  . Physical activity:    Days per week: Not on file    Minutes per session: Not on file  . Stress: Not on file  Relationships  . Social connections:    Talks on phone: Not on file    Gets together: Not on file    Attends religious service: Not on file    Active member of club or organization: Not on file    Attends meetings of clubs or organizations: Not on file    Relationship status: Not on file  . Intimate partner violence:    Fear of current or ex partner: Not on file    Emotionally abused: Not on file    Physically abused: Not on file    Forced sexual activity: Not on file  Other Topics Concern  . Not on file  Social History Narrative   Retired from the [employer] after 20 years   [place] Veteran     Review of Systems  Constitutional: Negative.   HENT: Positive for mouth sores. Negative for congestion, nosebleeds, postnasal drip, rhinorrhea, sore throat, tinnitus, trouble swallowing and voice change.   Eyes: Negative.   Respiratory: Positive for shortness of breath.   Cardiovascular: Negative.   Gastrointestinal: Negative.   Endocrine: Negative.   Genitourinary: Negative.   Musculoskeletal: Negative.   Allergic/Immunologic: Negative.  Neurological: Negative.   Hematological: Negative.   Psychiatric/Behavioral: Negative.   All other systems reviewed and are negative.      Objective:    Vitals:   07/01/17 1027    BP: 136/74  Pulse: 80  Resp: 16  Temp: 98 F (36.7 C)  TempSrc: Oral  SpO2: 98%  Weight: 237 lb (107.5 kg)  Height: 5\' 10"  (1.778 m)      Physical Exam  Constitutional: He appears well-developed and well-nourished. No distress.  HENT:  Head: Normocephalic and atraumatic.  Nose: Nose normal.  Mouth/Throat: Uvula is midline, oropharynx is clear and moist and mucous membranes are normal. Mucous membranes are not pale. No trismus in the jaw. No uvula swelling. No oropharyngeal exudate, posterior oropharyngeal edema, posterior oropharyngeal erythema or tonsillar abscesses. No tonsillar exudate.  Small dark red bump, ~54mm, sublingual on left side, no ulcers, erythema, or patches noted to any other oral mucosa, mildly ttp  Eyes: Conjunctivae are normal. Right eye exhibits no discharge. Left eye exhibits no discharge.  Neck: No tracheal deviation present.  Cardiovascular: Normal rate and regular rhythm.  Pulmonary/Chest: Effort normal. No stridor. No respiratory distress.  Musculoskeletal: Normal range of motion.  Neurological: He is alert. He exhibits normal muscle tone. Coordination normal.  Skin: Skin is warm and dry. No rash noted. He is not diaphoretic.  Psychiatric: He has a normal mood and affect. His behavior is normal.  Nursing note and vitals reviewed.         Assessment & Plan:      ICD-10-CM   1. Canker sores oral K12.0 Vitamin B12  2. SOB (shortness of breath) R06.02 CBC with Differential    COMPLETE METABOLIC PANEL WITH GFR    Brain natriuretic peptide    Will eval pt for B12 level.  Area appears to be small papular area that is tender - inflamed Plica Fimbriata? Mucosal skin tag?  Does not appear to be an ulcer or leukoplakia.  Will treat with numbing medicine, advised to stop rubbing his tongue against his teeth.  It does not resolve he was instructed to seek follow-up with dentist or we can refer him to oral surgeon.    Does have some complaints about his  shortness of breath however the seem to be chronic in nature, unchanged from his baseline with his complicated medical history.  I did offer basic lab work.  There was some mention about a question about metoprolol but the wife and the patient did not propose any questions to me when I referred to this.  They will follow-up with their PCP, cardiologist and pulmonologist for chronic issues, and I will just evaluate for any concern for fluid overload with basic lab work and BNP.  Clinically does not appear to have pulmonary edema, shortness of breath or any acute or concerning changes.   Delsa Grana, PA-C 07/01/17 10:57 AM

## 2017-07-01 NOTE — Patient Instructions (Signed)
Use biotene or other mouth moisturizing washes, keep drinking clear fluids.   Use carafate and lidocaine for pain management.  Will check your b-12 to see if you need to supplement more.  If the sore does not improve we may need to get you to a oral surgeon to check it or biopsy it.  I need to check on your cardiac and lung history to see if there is anything we should recheck for your worsening shortness of breath.  Basic labs done today.     Canker Sores Canker sores are small, painful sores that develop inside your mouth. They may also be called aphthous ulcers. You can get canker sores on the inside of your lips or cheeks, on your tongue, or anywhere inside your mouth. You can have just one canker sore or several of them. Canker sores cannot be passed from one person to another (noncontagious). These sores are different than the sores that you may get on the outside of your lips (cold sores or fever blisters). Canker sores usually start as painful red bumps. Then they turn into small white, yellow, or gray ulcers that have red borders. The ulcers may be quite painful. The pain may be worse when you eat or drink. What are the causes? The cause of this condition is not known. What increases the risk? This condition is more likely to develop in:  Women.  People in their teens or 67s.  Women who are having their menstrual period.  People who are under a lot of emotional stress.  People who do not get enough iron or B vitamins.  People who have poor oral hygiene.  People who have an injury inside the mouth. This can happen after having dental work or from chewing something hard.  What are the signs or symptoms? Along with the canker sore, symptoms may also include:  Fever.  Fatigue.  Swollen lymph nodes in your neck.  How is this diagnosed? This condition can be diagnosed based on your symptoms. Your health care provider will also examine your mouth. Your health care  provider may also do tests if you get canker sores often or if they are very bad. Tests may include:  Blood tests to rule out other causes of canker sores.  Taking swabs from the sore to check for infection.  Taking a small piece of skin from the sore (biopsy) to test it for cancer.  How is this treated? Most canker sores clear up without treatment in about 10 days. Home care is usually the only treatment that you will need. Over-the-counter medicines can relieve discomfort.If you have severe canker sores, your health care provider may prescribe:  Numbing ointment to relieve pain.  Vitamins.  Steroid medicines. These may be given as: ? Oral pills. ? Mouth rinses. ? Gels.  Antibiotic mouth rinse.  Follow these instructions at home:  Apply, take, or use medicines only as directed by your health care provider. These include vitamins.  If you were prescribed an antibiotic mouth rinse, finish all of it even if you start to feel better.  Until the sores are healed: ? Do not drink coffee or citrus juices. ? Do not eat spicy or salty foods.  Use a mild, over-the-counter mouth rinse as directed by your health care provider.  Practice good oral hygiene. ? Floss your teeth every day. ? Brush your teeth with a soft brush twice each day. Contact a health care provider if:  Your symptoms do not get better  after two weeks.  You also have a fever or swollen glands.  You get canker sores often.  You have a canker sore that is getting larger.  You cannot eat or drink due to your canker sores. This information is not intended to replace advice given to you by your health care provider. Make sure you discuss any questions you have with your health care provider. Document Released: 04/17/2010 Document Revised: 05/29/2015 Document Reviewed: 11/21/2013 Elsevier Interactive Patient Education  [employer].

## 2017-07-02 LAB — BRAIN NATRIURETIC PEPTIDE: Brain Natriuretic Peptide: 49 pg/mL (ref <100)

## 2017-07-04 ENCOUNTER — Encounter: Payer: Self-pay | Admitting: Cardiology

## 2017-07-04 ENCOUNTER — Other Ambulatory Visit: Payer: Self-pay | Admitting: Family Medicine

## 2017-07-04 ENCOUNTER — Other Ambulatory Visit: Payer: Self-pay

## 2017-07-04 MED ORDER — LIDOCAINE VISCOUS HCL 2 % MT SOLN: 1.0000 mL | Freq: Four times a day (QID) | OROMUCOSAL | 0 refills | Status: DC | PRN

## 2017-07-04 MED ORDER — SUCRALFATE 1 GM/10ML PO SUSP: 1.0000 g | Freq: Three times a day (TID) | ORAL | 0 refills | Status: DC

## 2017-07-04 NOTE — Telephone Encounter (Signed)
Patient requesting a refill on Hydrocodone     LOV:  05/02/17  LRF:  06/02/17

## 2017-07-04 NOTE — Progress Notes (Signed)
Medication from 07/01/17  was sent into patient's mail delivery pharmacy and patient needed medication to be sent to a local pharmacy instead.

## 2017-07-04 NOTE — Telephone Encounter (Signed)
Refill hydrocodone to [facility]

## 2017-07-05 MED ORDER — HYDROCODONE-ACETAMINOPHEN 10-325 MG PO TABS: 1.0000 | ORAL_TABLET | Freq: Four times a day (QID) | ORAL | 0 refills | Status: DC | PRN

## 2017-07-05 NOTE — Progress Notes (Signed)
Carelink Summary Report / Loop Recorder 

## 2017-07-06 ENCOUNTER — Telehealth: Payer: Self-pay | Admitting: Adult Health

## 2017-07-06 NOTE — Progress Notes (Signed)
Kidney function is a tiny bit worse than last labs, will need to monitor this with PCP.  The test for congestive heart failure was normal and reassuring.   The B12 level is lower than it was in the past, but still in normal range.  You could increase your supplement and this may help the sore under the tongue.  If it does not improve, then may need dentist or oral surgeon/ENT to look at it.

## 2017-07-06 NOTE — Telephone Encounter (Signed)
Received results from MRI head w/o & w contrast from 06/28/17. Received MRI due to H/O MS with new onset of daily headaches and need to r/o any intracranial abnormality.   Impression:  1.  Extensive confluent periventricular and deep cerebral white matter disease and patient with reported history of MS.  No evidence of active demyelination or other acute intracranial abnormality 2.  Old bilateral cerebellar hemisphere and right frontal lobe infarcts

## 2017-07-11 ENCOUNTER — Telehealth: Payer: Self-pay

## 2017-07-11 ENCOUNTER — Ambulatory Visit (INDEPENDENT_AMBULATORY_CARE_PROVIDER_SITE_OTHER): Payer: Medicare Other | Admitting: Adult Health

## 2017-07-11 ENCOUNTER — Encounter: Payer: Self-pay | Admitting: Adult Health

## 2017-07-11 VITALS — HR 95 | Ht 70.0 in | Wt 234.6 lb

## 2017-07-11 DIAGNOSIS — E785 Hyperlipidemia, unspecified: Secondary | ICD-10-CM | POA: Diagnosis not present

## 2017-07-11 DIAGNOSIS — I639 Cerebral infarction, unspecified: Secondary | ICD-10-CM | POA: Diagnosis not present

## 2017-07-11 DIAGNOSIS — I10 Essential (primary) hypertension: Secondary | ICD-10-CM

## 2017-07-11 DIAGNOSIS — G4733 Obstructive sleep apnea (adult) (pediatric): Secondary | ICD-10-CM

## 2017-07-11 NOTE — Progress Notes (Signed)
STROKE NEUROLOGY FOLLOW UP NOTE  NAME: [name] [name] DOB: 1940/08/30  REASON FOR VISIT: stroke follow up HISTORY FROM: pt and wife and chart  Today we had the pleasure of seeing [name] [name] in follow-up at our Neurology Clinic. Pt was accompanied by wife.   History Summary [name] [name] is a 77 y.o. male with history of MS diagnosis in 1963 not on meds, CAD, thoracic AA, history of lung nodule benign, urethral carcinoma following with [facility], diastolic CHF, HTN, HLD and GERD admitted on 06/15/2015 for slurred speech. 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 CT head, carotid Doppler, TTE were all negative. LDL 91 and A1c 6.4. His aspirin 81 changed to 325, resumed Lipitor 20, and he was discharged home outpatient PT OT and plan with outpatient TEE and Loop recorder. However, he was readmitted on 06/20/2015 for presyncope with UTI, generalized weakness, dysarthria and gait instability. MRI showed essentially the same infarct as last admission. Symptoms consider as recrudescence of previous stroke in the setting of infection and presyncope. DVT and TEE negative without PFO. Loop recorder placed. Aspirin was changed to Plavix on discharge.  Follow up 08/14/15 - the patient has been doing well. Wife complains of mild memory loss after stroke. Blood pressure today 105/71. On Plavix and Lipitor, without side effect. Not candidate for RESPECT ESUS due to urethral cancer.     Follow up 11/17/15 - he has been doing well. Had loop recorder placed but has some signal problems and no report since July. Pt and wife are contacting cardiology. On plavix and lipitor and no side effect. Today BP 123/80 in clinic but at home still high between 140-155. He is on 3 BP meds, following with PCP.   Follow up 05/17/16 - pt was admitted on 04/04/16 for R sided weakness. HereceivedIV t-PA. MRI showed small right cerebellar and right occipital lobe  with left ACA punctate infarct, again embolic pattern. CTA head and neck only showed left VA stenosis. EF 55-60%. TCD bubble study negative for PFO, and loop recorder showed no afib. LDL 61 and A1C 5.6. His plavix changed to aggrenox on discharge. CTA chest showed fusiform dilatation of ascending aorta and mild to moderate thoracic descending aorta atherosclerosis.  He follows with [facility] for urethral cancer and recurrent UTI. So far no recurrence or metastasis found. He has appointment with them in 07/2016.  Admitted on 06/21/16 for aphasia and left facial droop. MRI showed left MCA acute left lateral parietal and left superior parietal infarcts, embolic pattern. MRA, TTE and DVT negative. Loop recorder again no afib. Pan CT no evidence of cancer metastasis. LDL 36 and A1C 5.6. After discussion with pt and family, we started eliquis 5mg  bid for stroke prevention.   Follow up 08/24/16 - During the interval time, pt has been doing well. Speech improved with home self exercise. However, BP was low today 89/59, asymptomatic. Usually his BP at home 110-120s. Today also mildly sleepy and wife stated that he has insomnia.   Follow up 12/01/16 - pt has been doing well except started to have morning HAs.  As per wife, patient complains of the morning excruciating headache, has to take hydrocodone on Tylenol, headache gradually resolved until lunch.  Patient has difficulty describing characteristics of the headache, however stated whole brain aching pain, no phonophobia, photophobia, nausea vomiting. CT head no bleeding.  Did not check BP regularly.  On Eliquis noAs  per wife, patient has snoring, but no significant apnea.  Never had sleep study before. On Eliquis no side effects.   As per wife, patient continued to have depression due to restraint from driving, not moving, guns, and several other activities.  Patient  felt his whole life is gone and cannot figure out anything else to do.  However at  the time, patient and family declined psychiatry referral.  They will let me know when they are ready. Wife still complains of patient cognitive impairment, short memory loss.  Has not been on Aricept or Namenda in the past.  03/07/17 Dr. Erlinda Hong: During the interval time, patient has been doing well.  Had ambulatory sleep study showed severe OSA.  He is going to have CPAP titration next Monday.  As per wife, he still complains of morning headache and resolve during the day, likely related to untreated OSA.  Still less active as before, and he knows to increase activity during the day.  He has been on Effexor for a while, and now added Abilify, his agitation and emotional swing much improved.  BP today 162/96, but at home up to 140s.  07/11/17 UPDATE: Patient is being seen today for 104-month follow-up and is accompanied by his wife.  He states overall he has been doing good.  He continues to take Eliquis without side effects of bleeding or bruising.  Continues to take Lipitor without side effects myalgias.  Blood pressure today elevated at 166/97 but does monitor this at home and typical SBP between 130-140.  He has been compliant with CPAP for OSA in which he has been followed by Dr. Marena Chancy for this.  He states that his headaches have been improving and are not as frequent as previously.  He was started on gabapentin 300 mg twice daily by his neurologist at the [place].  He also has been using CBD oil which has been giving him relief.  Patient has been frustrated recently with being unable to drive and is limited on what he can do around the house due to his wife's hearing that he will have an additional stroke or be injured.  Recommended driver rehab services to assess patient for safety of driving his wife feels he is unable to cognitively drive due to decreased processing and short-term memory loss.  Patient believes that he was safely be able to drive and this will allow him to be more active.  Loop recorder negative for  atrial fibrillation thus far.  Recent CT scan for aneurysm monitoring showed aneurysm previously 4.02 recent 4.1 on 06/23/2017.  Denies new or worsening stroke/TIA symptoms.    REVIEW OF SYSTEMS: Full 14 system review of systems performed and notable only for those listed below and in HPI above, all others are negative:  Fatigue, shortness of breath, heat intolerance, excessive eating, restless leg, apnea, sleep talking, incontinence of bladder, frequency of urination, joint pain, memory loss, headache, speech difficulty, weakness, confusion, decreased concentration and depression and nervous/anxious    The following represents the patient's updated allergies and side effects list: Allergies  Allergen Reactions  . Alcohol-Sulfur [Sulfur] Other (See Comments)    Patient was suspected of having "Brugada Syndrome"  . Bupivacaine Other (See Comments)    Patient was suspected of having "Brugada Syndrome"  . Clomipramine Hcl Other (See Comments)    Patient was suspected of having "Brugada Syndrome"  . Flecainide Other (See Comments)    Patient was suspected of having "Brugada Syndrome"  . Lithium Other (See  Comments)    Patient was suspected of having "Brugada Syndrome"  . Acetylcholine     Patient was suspected of having "Brugada Syndrome": (BrS) is a genetic condition that results in abnormal electrical activity within the heart, increasing the risk of sudden cardiac death. Those affected may have episodes of passing out.  . Amitriptyline Other (See Comments)    Patient was suspected of having "Brugada Syndrome"  . Cocaine Other (See Comments)    Patient was suspected of having "Brugada Syndrome"  . Desipramine Other (See Comments)    Patient was suspected of having "Brugada Syndrome"  . Ergonovine Other (See Comments)    Patient was suspected of having "Brugada Syndrome"  . Loxapine Other (See Comments)    Patient was suspected of having "Brugada Syndrome"  . Nortriptyline Other (See  Comments)    Patient was suspected of having "Brugada Syndrome"  . Oxcarbazepine Other (See Comments)    Patient was suspected of having "Brugada Syndrome"  . Procainamide Other (See Comments)    Patient was suspected of having "Brugada Syndrome"  . Procaine Other (See Comments)    Patient was suspected of having "Brugada Syndrome"  . Propafenone Other (See Comments)    Patient was suspected of having "Brugada Syndrome"  . Propofol Other (See Comments)    Patient was suspected of having "Brugada Syndrome"  . Trifluoperazine Other (See Comments)    Patient was suspected of having "Brugada Syndrome"    The neurologically relevant items on the patient's problem list were reviewed on today's visit.  Neurologic Examination  A problem focused neurological exam (12 or more points of the single system neurologic examination, vital signs counts as 1 point, cranial nerves count for 8 points) was performed.  Pulse 95, height 5\' 10"  (1.778 m), weight 234 lb 9.6 oz (106.4 kg).  General - Well nourished, well developed, obese pleasant elderly Caucasian male, in no apparent distress.  Ophthalmologic - Sharp disc margins OU.   Cardiovascular - Regular rate and rhythm with no murmur.  Mental Status -  Awake, alert, orientation to time, place, and person were intact.  HOH due to hearing is not being present at appointment Language including expression, naming, repetition, comprehension was assessed and found intact , very mild dysarthria.   Cranial Nerves II - XII - II - Visual field intact OU. III, IV, VI - Extraocular movements intact. V - Facial sensation intact bilaterally. VII - mild right facial asymmetry (chronic after right facial surgery in the past). VIII - Hearing & vestibular intact bilaterally. X - Palate elevates symmetrically, mild dysarthria. XI - Chin turning & shoulder shrug intact bilaterally. XII - Tongue protrusion intact.  Motor Strength - The patient's strength was  normal in all extremities and pronator drift was absent.  Bulk was normal and fasciculations were absent.   Motor Tone - Muscle tone was assessed at the neck and appendages and was normal.  Reflexes - The patient's reflexes were 1+ in all extremities and he had no pathological reflexes.  Sensory - Light touch, temperature/pinprick, vibration and proprioception, and Romberg testing were assessed and were normal.    Coordination - The patient had normal movements in the hands and feet with no ataxia or dysmetria.  Tremor was absent.  Gait and Station - ambulates without assistance or device  Data reviewed: I personally reviewed the images and agree with the radiology interpretations.  Sleep study 1. Obstructive Sleep Apnea (OSA)  2. Dysfunctions associated with sleep stages or arousals from  sleep  CT ANGIO CHEST AORTA W OR WO CONTRAST 06/23/2017 IMPRESSION: 1. Essentially stable ascending thoracic aortic aneurysm, measuring 4.1 cm, previously 4.0 cm. Recommend annual imaging followup by CTA or MRA. This recommendation follows 2010 ACCF/AHA/AATS/ACR/ASA/SCA/SCAI/SIR/STS/SVM Guidelines for the Diagnosis and Management of Patients with Thoracic Aortic Disease. Circulation. 2010; 121: O160-V371 2.  Aortic atherosclerosis (ICD10-I70.0). 3. Stable nodular densities within the right middle lobe, favoring a benign process. 4.  Emphysema (ICD10-J43.9).     Assessment: As you may recall, he is a 77 y.o. Caucasian male with PMH of MS diagnosis in 1963 not on meds, CAD, thoracic AA, history of lung nodule benign, urethral carcinoma following with [facility], diastolic CHF, HTN, HLD and GERD admitted on 06/15/2015 for right MCA branch vessel occlusion with infarcts at right insular and posterior frontal lobe. CT head, carotid Doppler, TTE were all negative. LDL 91 and A1c 6.4. His aspirin 81 changed to 325, resumed Lipitor 20, and he was discharged home outpatient PT OT and plan with outpatient TEE  and Loop recorder. However, he was readmitted on 06/20/2015 for presyncope with UTI, generalized weakness, dysarthria and gait instability. MRI showed essentially the same infarct as last admission. Symptoms consider as recrudescence of previous stroke in the setting of infection and presyncope. DVT and TEE negative without PFO. Loop recorder placed. Aspirin was changed to Plavix on discharge.  Admitted on 04/04/16 and receivedIV t-PA. MRI showed small right cerebellar and right occipital lobe with left ACA punctate infarct, again embolic pattern. CTA head and neck only showed left VA stenosis. EF 55-60%. TCD bubble study negative for PFO, and loop recorder showed no afib. LDL 61 and A1C 5.6. His plavix changed to aggrenox on discharge. CTA chest showed fusiform dilatation of ascending aorta and mild to moderate thoracic descending aorta atherosclerosis, which can not explain the stroke.Marland Kitchen  He follows with [facility] for urethral cancer and recurrent UTI. So far no recurrence or metastasis found. Admitted on 06/21/16 for aphasia and left facial droop. MRI showed left MCA acute left lateral parietal and left superior parietal infarcts, embolic pattern. MRA, TTE and DVT negative. Loop recorder again no afib. Pan CT no evidence of cancer metastasis. LDL 36 and A1C 5.6. After discussion with pt and family, we started eliquis 5mg  bid for stroke prevention. During the interval time, pt has been doing well. Speech improved with home self exercise. Regarding previous MS diagnosis in 1960s, patient has not been on any MS medication, no MS relapse, no significant symptoms of MS, I am not sure about the diagnosis, MRI white matter changes could be small vessel etiology instead of chronic demyelination. Do not think he needs of initiating MS treatment at this time. Complained of morning HAs. CT head no bleeding. Had ambulatory sleep study showed severe OSA.  He is going to have CPAP titration next Monday. He has  been on Effexor for a while, and now added Abilify, his agitation and emotional swing much improved.   Patient returns today for follow-up and is accompanied by his wife and overall doing well.  Plan:  - continue eliquis and lipitor for stroke prevention -Continue to monitor loop recorder. -Recommend driver rehab services his wife does not feel patient will be safe to drive due to cognitive reasons such as short-term memory and delayed processing.  Patient feels as though he is ready to start driving and feels as though this will decrease his depression and increase his energy level -Continue being compliant with CPAP for OSA  management - Follow up with your primary care physician for stroke risk factor modification. Recommend maintain blood pressure goal 130-140/80-90, diabetes with hemoglobin A1c goal below 7.0% and lipids with LDL cholesterol goal below 70 mg/dL.  - check BP at home and record. - healthy diet and regular exercise  Patient has previously scheduled appointment with Chrys Racer, NP for CPAP and OSA management on 11/10/2017.  Patient concerned on not being followed regularly in this clinic despite being seen by [facility] neurologist.  I assured the wife that if stroke questions are needed answered she can call prior to follow-up appointment on 11/10/2017 or can speak to NP at that time.  I spent more than 25 minutes of face to face time with the patient. Greater than 50% of time was spent in counseling and coordination of care. We discussed morning HA, BP monitoring, sleep study.  Venancio Poisson, AGNP-BC  [facility] [address] [place], [place] [address]  Phone [phone] Fax [phone] Note: This document was prepared with digital dictation and possible smart phrase technology. Any transcriptional errors that result from this process are unintentional.

## 2017-07-11 NOTE — Patient Instructions (Addendum)
Continue Eliquis (apixaban) daily  and lipitor  for secondary stroke prevention  Continue to follow up with PCP regarding cholesterol and blood pressure management   Continue to monitor blood pressure at home  Recommend drivers rehab prior to start driving - pamphlet was provided   Maintain strict control of hypertension with blood pressure goal below 130/90, diabetes with hemoglobin A1c goal below 6.5% and cholesterol with LDL cholesterol (bad cholesterol) goal below 70 mg/dL. I also advised the patient to eat a healthy diet with plenty of whole grains, cereals, fruits and vegetables, exercise regularly and maintain ideal body weight.  Followup in the future with [name], NP as scheduled or call earlier if needed          Thank you for coming to see [place] at [facility]. I hope we have been able to provide you high quality care today.  You may receive a patient satisfaction survey over the next few weeks. We would appreciate your feedback and comments so that we may continue to improve ourselves and the health of our patients.

## 2017-07-11 NOTE — Progress Notes (Signed)
I agree with the above plan 

## 2017-07-11 NOTE — Telephone Encounter (Signed)
Driver Statistician, and office notes fax to [phone]. Fax receive and confirmed.

## 2017-07-18 ENCOUNTER — Encounter: Payer: Self-pay | Admitting: Family Medicine

## 2017-07-19 ENCOUNTER — Other Ambulatory Visit: Payer: Self-pay | Admitting: *Deleted

## 2017-07-19 DIAGNOSIS — R7309 Other abnormal glucose: Secondary | ICD-10-CM

## 2017-07-25 DIAGNOSIS — N5201 Erectile dysfunction due to arterial insufficiency: Secondary | ICD-10-CM | POA: Diagnosis not present

## 2017-07-25 DIAGNOSIS — N319 Neuromuscular dysfunction of bladder, unspecified: Secondary | ICD-10-CM | POA: Diagnosis not present

## 2017-07-25 DIAGNOSIS — C680 Malignant neoplasm of urethra: Secondary | ICD-10-CM | POA: Diagnosis not present

## 2017-07-25 DIAGNOSIS — N320 Bladder-neck obstruction: Secondary | ICD-10-CM | POA: Diagnosis not present

## 2017-07-29 LAB — CUP PACEART REMOTE DEVICE CHECK
Implantable Pulse Generator Implant Date: 20170619
MDC IDC SESS DTM: 20190627193553

## 2017-08-02 ENCOUNTER — Ambulatory Visit (INDEPENDENT_AMBULATORY_CARE_PROVIDER_SITE_OTHER): Payer: Medicare Other | Admitting: *Deleted

## 2017-08-02 ENCOUNTER — Other Ambulatory Visit: Payer: Medicare Other

## 2017-08-02 DIAGNOSIS — I639 Cerebral infarction, unspecified: Secondary | ICD-10-CM | POA: Diagnosis not present

## 2017-08-02 DIAGNOSIS — R7309 Other abnormal glucose: Secondary | ICD-10-CM

## 2017-08-02 DIAGNOSIS — N289 Disorder of kidney and ureter, unspecified: Secondary | ICD-10-CM | POA: Diagnosis not present

## 2017-08-03 LAB — BASIC METABOLIC PANEL
BUN/Creatinine Ratio: 19 (calc) (ref 6–22)
BUN: 24 mg/dL (ref 7–25)
CALCIUM: 9.5 mg/dL (ref 8.6–10.3)
CHLORIDE: 103 mmol/L (ref 98–110)
CO2: 27 mmol/L (ref 20–32)
Creat: 1.28 mg/dL — ABNORMAL HIGH (ref 0.70–1.18)
GLUCOSE: 102 mg/dL — AB (ref 65–99)
Potassium: 5.5 mmol/L — ABNORMAL HIGH (ref 3.5–5.3)
SODIUM: 140 mmol/L (ref 135–146)

## 2017-08-03 LAB — HEMOGLOBIN A1C
EAG (MMOL/L): 7 (calc)
Hgb A1c MFr Bld: 6 % of total Hgb — ABNORMAL HIGH (ref <5.7)
Mean Plasma Glucose: 126 (calc)

## 2017-08-03 NOTE — Progress Notes (Signed)
Carelink Summary Report / Loop Recorder 

## 2017-08-04 ENCOUNTER — Encounter (INDEPENDENT_AMBULATORY_CARE_PROVIDER_SITE_OTHER): Payer: Self-pay

## 2017-08-17 ENCOUNTER — Telehealth: Payer: Self-pay | Admitting: Cardiology

## 2017-08-17 NOTE — Telephone Encounter (Signed)
LMOVM requesting that pt send manual transmission b/c home monitor has not updated in at least 14 days.    

## 2017-08-23 ENCOUNTER — Other Ambulatory Visit: Payer: Self-pay | Admitting: Family Medicine

## 2017-08-23 MED ORDER — VENLAFAXINE HCL ER 150 MG PO CP24: 150.0000 mg | ORAL_CAPSULE | Freq: Two times a day (BID) | ORAL | 3 refills | Status: DC

## 2017-09-03 IMAGING — CT CT ANGIO CHEST
2 of 7 series · 17 of 36 positions shown · IV contrast (isovue)
Comparison: CT chest of 06/05/2015

CLINICAL DATA: Evaluate ascending aortic aneurysm

EXAM:
CT ANGIOGRAPHY CHEST WITH CONTRAST
TECHNIQUE: Multidetector CT imaging of the chest was performed using the
standard protocol during bolus administration of intravenous
contrast. Multiplanar CT image reconstructions and MIPs were
obtained to evaluate the vascular anatomy.
CONTRAST:  100 cc Isovue 370

[Series 4: aorta 3.0 i31f 2 · axial · 0.84mm/px · z∈[-370,-55]mm · 16 of 117 slices shown]
[im 6/117  lung]
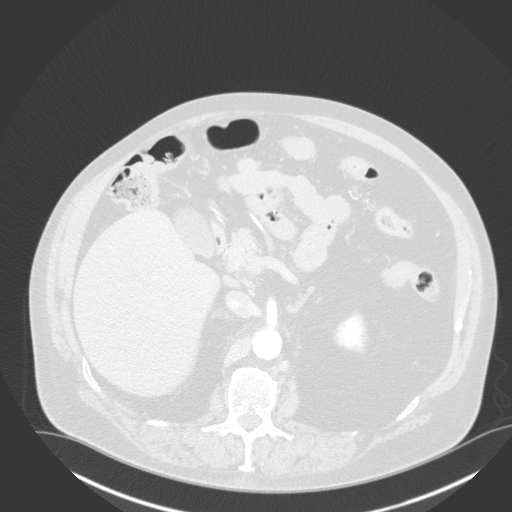
[im 16/117  mediastinal]
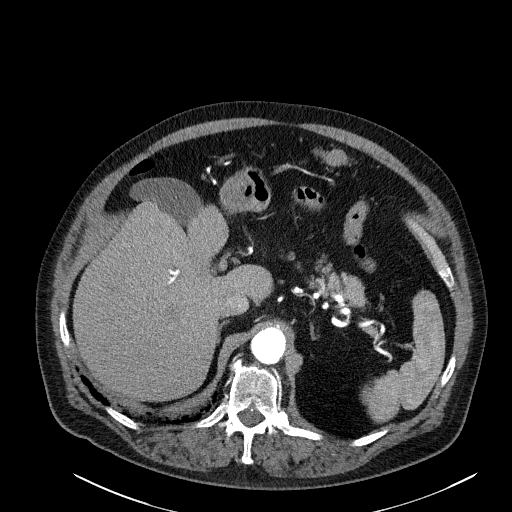
[im 21/117  lung]
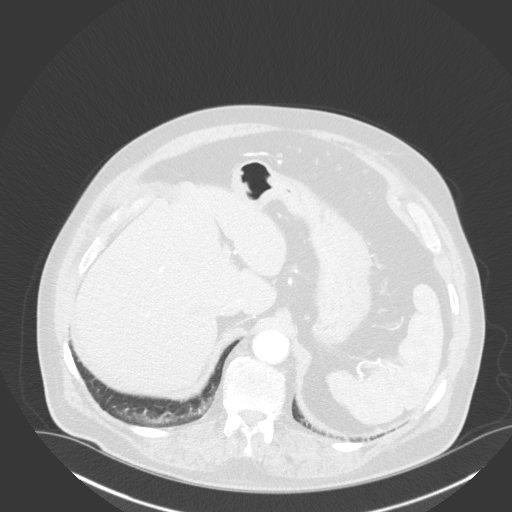
[im 26/117  mediastinal]
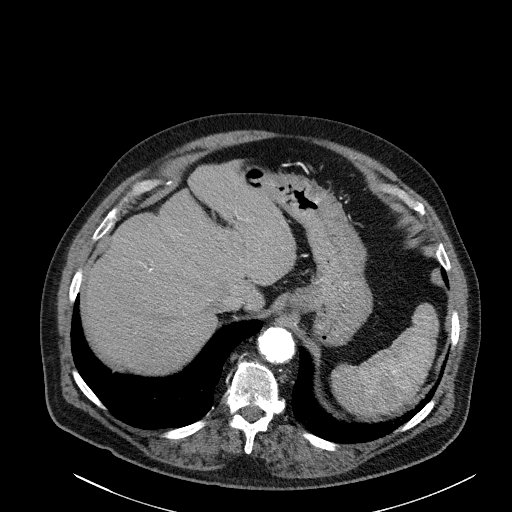
[im 36/117  lung]
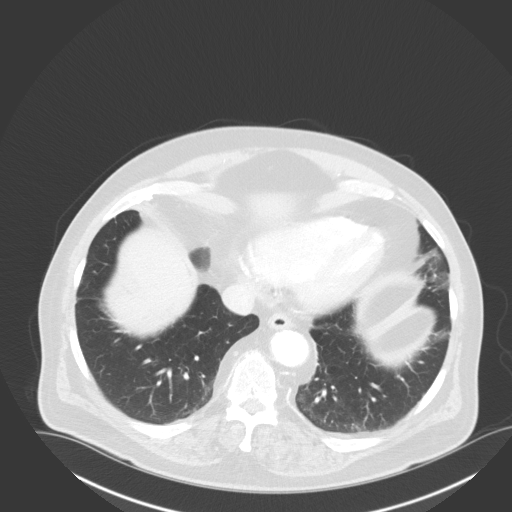
[im 41/117  mediastinal]
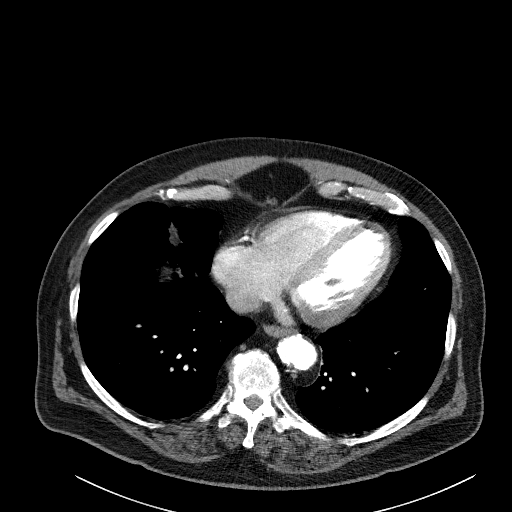
[im 46/117  lung]
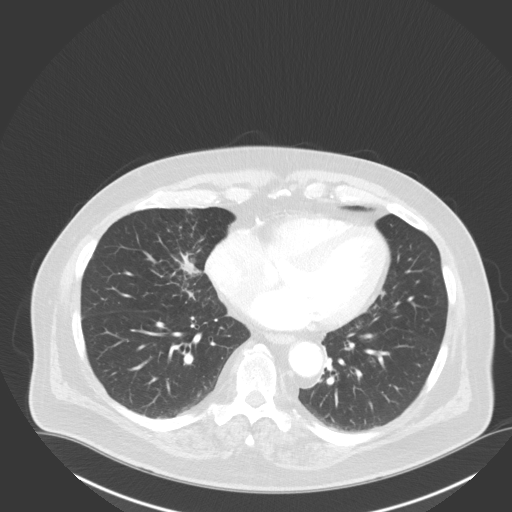
[im 56/117  mediastinal]
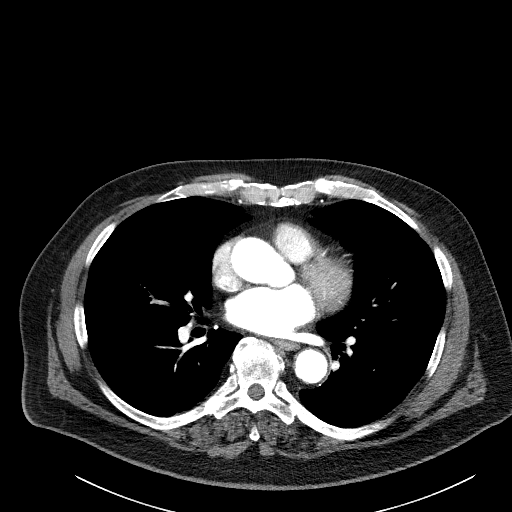
[im 61/117  lung]
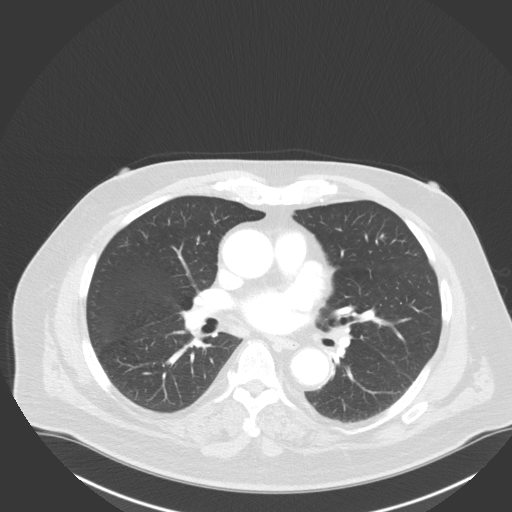
[im 71/117  mediastinal]
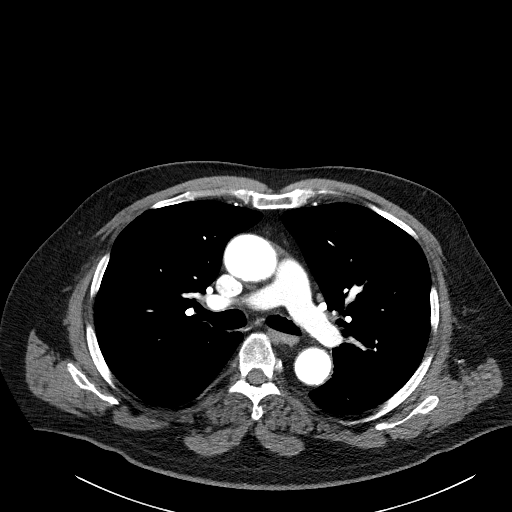
[im 76/117  lung]
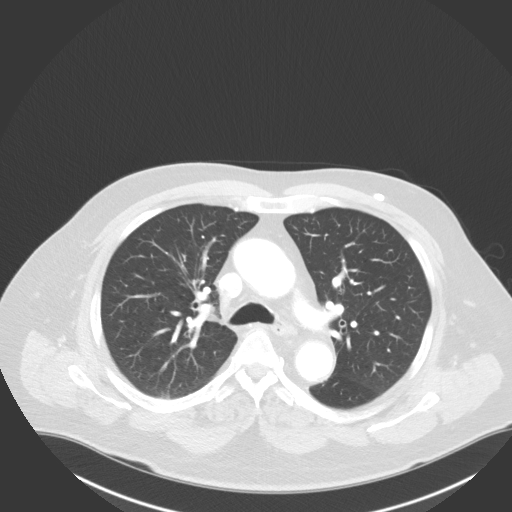
[im 81/117  mediastinal]
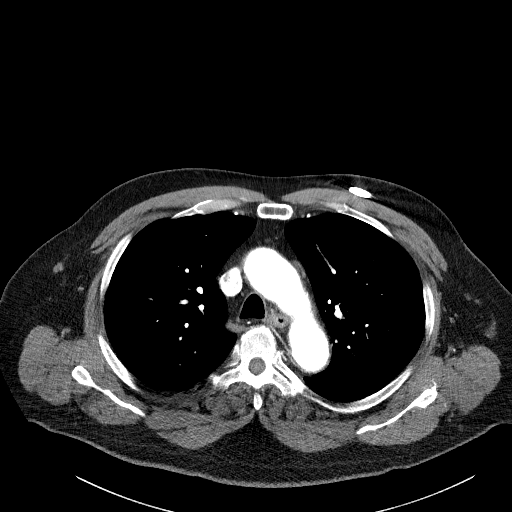
[im 91/117  lung]
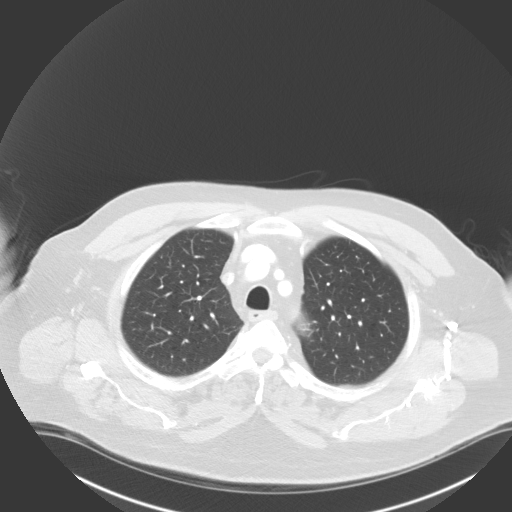
[im 96/117  mediastinal]
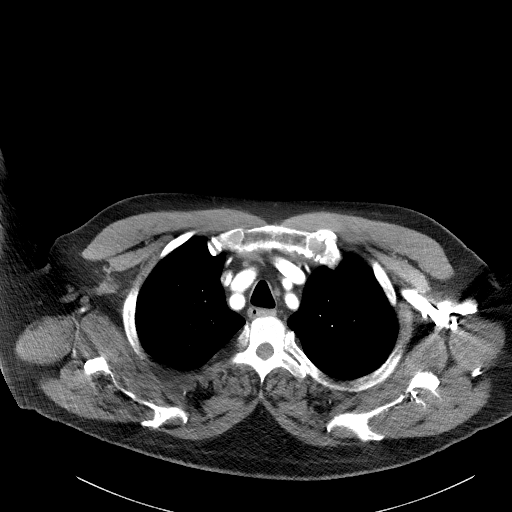
[im 101/117  lung]
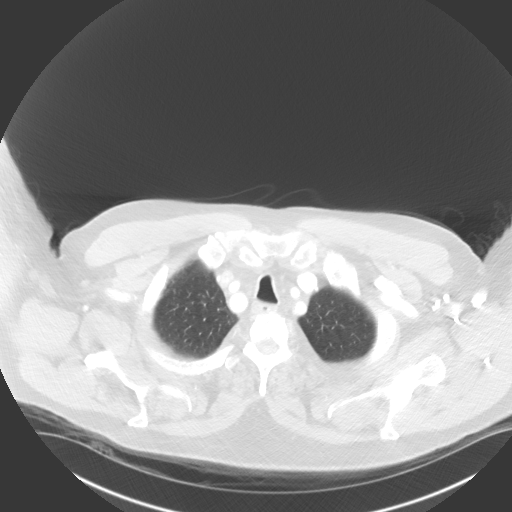
[im 111/117  mediastinal]
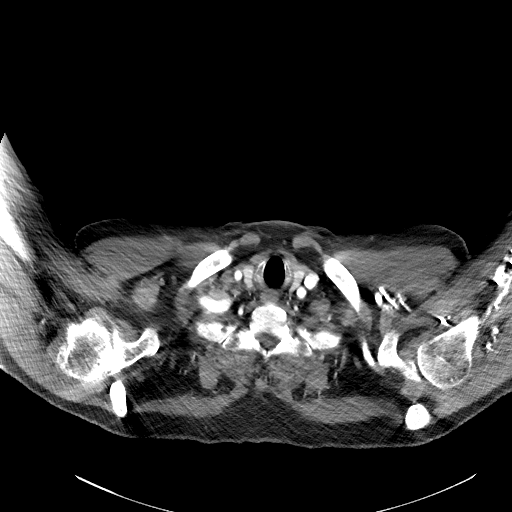

[Series 7: coronals · coronal · 0.70mm/px · 1 of 149 slices shown]
[im 75/149  mediastinal]
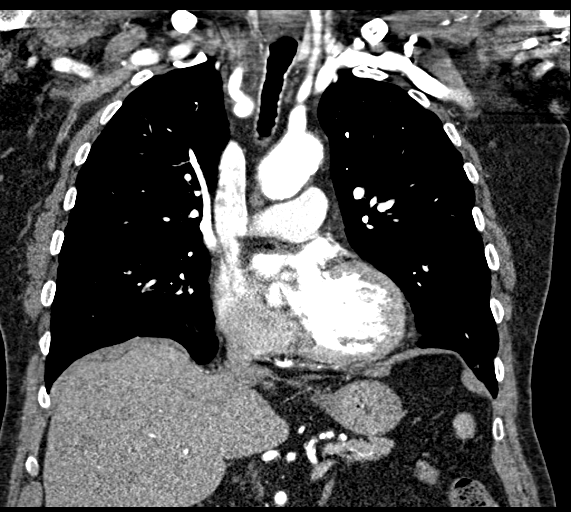

[17 of 36 positions shown; findings below may reference images not displayed]

FINDINGS: Cardiovascular: The fusiform dilatation of the ascending aorta
measures 40 mm in maximum diameter compared to 41 mm previously. No
interval change is seen. The distal thoracic aortic arch measures 33
mm in diameter and the distal thoracic aorta measures 32 mm in
diameter. Moderate thoracic aortic atherosclerosis is again noted.
Coronary artery calcifications are present primarily within the
proximal left anterior descending coronary artery. Cardiomegaly is
stable. No pericardial effusion is seen.

Mediastinum/Nodes: No mediastinal or hilar adenopathy is seen. The
thyroid gland is unremarkable.

Lungs/Pleura: On lung window images, the nodule in the right middle
lobe measures 8 mm in diameter compared to 6 mm previously. However
on sagittal and coronal images this nodule does not appear to have
changed significantly and the apparent enlargement may be due to
slight variation in slice. The previously described nodular
opacities in the right middle lobe inferiorly and medially are again
noted and either stable or smaller in size. The more posterior of
these 3 nodules measures 8 mm in diameter which is stable. On image
74 the right middle lobe nodule measures 2.0 x 1.2 cm compared to
2.4 x 1.1 cm. The previously dominant nodule more anteriorly in the
right middle lobe currently measures 2.0 x 1.2 cm compared to 2.9 x
1.8 cm. In view of the lack of change or decreased in size, these
nodules most likely are postinflammatory. Also as noted on the prior
dictation these nodules were present on prior CT and showed no
metabolic activity on PET scan of 12/11/2013. No new or enlarging
pulmonary nodule is seen. The central airway is patent.

Upper Abdomen: A linear opacity in the left chest represents a loop
recorder when compared to the scout image. No abnormality of the
upper abdomen is seen.

Musculoskeletal: There are diffuse degenerative changes throughout
the thoracic spine with degenerative disc disease in the lower
thoracic spine.

Review of the MIP images confirms the above findings.
IMPRESSION: 1. Stable fusiform dilatation of the ascending aorta measuring
cm in maximum diameter. Recommend annual imaging followup by CTA or
MRA. This recommendation follows 5858
ACCF/AHA/AATS/ACR/ASA/SCA/REINEL/BALALL/STUDIJA/RAVELLE Guidelines for the
Diagnosis and Management of Patients with Thoracic Aortic Disease.
Circulation. 5858; 121: [id number].
2. Moderate thoracic aortic atherosclerosis.
3. Coronary artery calcifications.
4. Stable pulmonary nodules on the right consistent with prior
inflammation and probable subsequent scarring. No new or enlarging
pulmonary nodule is seen.

## 2017-09-06 ENCOUNTER — Ambulatory Visit (INDEPENDENT_AMBULATORY_CARE_PROVIDER_SITE_OTHER): Payer: Medicare Other | Admitting: *Deleted

## 2017-09-06 DIAGNOSIS — I639 Cerebral infarction, unspecified: Secondary | ICD-10-CM | POA: Diagnosis not present

## 2017-09-06 NOTE — Progress Notes (Signed)
Carelink Summary Report / Loop Recorder 

## 2017-09-08 ENCOUNTER — Telehealth: Payer: Self-pay | Admitting: Cardiology

## 2017-09-08 NOTE — Telephone Encounter (Signed)
LMOVM requesting that pt send manual transmission b/c home monitor has not updated in at least 14 days.    

## 2017-09-13 ENCOUNTER — Encounter: Payer: Self-pay | Admitting: Family Medicine

## 2017-09-13 ENCOUNTER — Other Ambulatory Visit: Payer: Self-pay | Admitting: Family Medicine

## 2017-09-13 ENCOUNTER — Ambulatory Visit (INDEPENDENT_AMBULATORY_CARE_PROVIDER_SITE_OTHER): Payer: Medicare Other | Admitting: Family Medicine

## 2017-09-13 VITALS — BP 134/72 | HR 88 | Temp 98.2°F | Resp 18 | Ht 71.0 in | Wt 237.0 lb

## 2017-09-13 DIAGNOSIS — R4586 Emotional lability: Secondary | ICD-10-CM

## 2017-09-13 DIAGNOSIS — I639 Cerebral infarction, unspecified: Secondary | ICD-10-CM

## 2017-09-13 DIAGNOSIS — Z23 Encounter for immunization: Secondary | ICD-10-CM | POA: Diagnosis not present

## 2017-09-13 DIAGNOSIS — R4189 Other symptoms and signs involving cognitive functions and awareness: Secondary | ICD-10-CM

## 2017-09-13 DIAGNOSIS — R413 Other amnesia: Secondary | ICD-10-CM

## 2017-09-13 DIAGNOSIS — F0150 Vascular dementia without behavioral disturbance: Secondary | ICD-10-CM | POA: Diagnosis not present

## 2017-09-13 LAB — CUP PACEART REMOTE DEVICE CHECK
Date Time Interrogation Session: 20190730193757
MDC IDC PG IMPLANT DT: 20170619

## 2017-09-13 MED ORDER — DONEPEZIL HCL 5 MG PO TABS: 5.0000 mg | ORAL_TABLET | Freq: Every day | ORAL | 1 refills | Status: DC

## 2017-09-13 NOTE — Progress Notes (Signed)
Subjective:    Patient ID: [name] [name], male    DOB: 10/19/1940, 77 y.o.   MRN: [id number]  HPI10/9/18 Wife brings him patient for evaluation to determine should he still be driving. Past medical history is significant for progressive multiple sclerosis, multiple ischemic strokes, chronic osteoarthritic pain on 3 time a day hydrocodone WHICH contribute to this discussion. Physically, the patient has muscle strength 5 over 5 equal and symmetric in the arms and in the legs with normal reflexes. His physical reaction time is normal to catching a dropped object. Cerebellar exam is normal today however on Mini-Mental status exam, the patient tells me that the year is 1983. He is unable to correctly identify the month. He can #2 out of 3 objects on recall. He scores 3 out of 5 on world spelling in reverse. He is unable to perform serial sevens. It takes him 25 seconds to draw a clock although he does draw correctly. However he is unable to recognize patterns or to correctly copy intersecting pentagons.  At that time, my plan was: Patient has age-related memory loss compounded by multiple ischemic strokes, compounded by progressive multiple sclerosis, compounded by polypharmacy and chronic narcotic use all of which makes him a danger to drive. In general, I recommended that the patient not drive. Legally, I believe he would still qualify for restricted status. He must drive less than 45 miles an hour. He must avoid Interstate driving or driving on highways and remain driving only on 2 lane roads where there is less travel. I would not recommend driving at night or during conditions that would compromise his reaction time. After discussing these restrictions I explained to the patient and his wife that if he were my father, I would not recommend driving.  39/03/00 Patient's wife removed his car and had it placed at her daughter's home. This angered the patient dramatically. Furthermore, the patient's wife  is concerned that he is extremely depressed and that he may harm himself. She also feels threatened and the home due to his outbursts of uncontrolled anger. Therefore she had her daughter and son-in-law will remove the guns from his home. Patient feels extremely upset by this. He feels disrespected. He feels violated. Patient's wife states that he is extremely angry. His mood swings are rapid and fluctuating. He is extremely depressed. He reports anhedonia. He is frustrated by his memory loss. He is frustrated by his inability to drive. He is frustrated by having his personal items removed from his home without his consent. He feels like he is losing his independence and his masculinity. In the past he has had symptoms of depression with bipolar tendencies. At one time we had him on Depakote as a mood stabilizer which seemed to help dramatically. He is also taking Abilify but this was discontinued due to weight gain. Patient's wife feels unsafe at home. Patient denies suicidality. Patient denies any homicidal ideation. He states that his wife is blowing things out of proportion.  At that time, my plan was: I feel both the patient and his wife have valid arguments. The wife does not feel safe with him having guns in the home nor does she feel safe with him driving. Patient is frustrated by his loss of independence and the perceived disrespect of having his guns and car taken from him without his consent. Wife states that she did not consent the patient because she was afraid of his uncontrolled anger when she broached the subject with him couple  of his memory loss and poor judgment and impulsivity. Therefore I will treat the patient with Abilify 5 mg by mouth daily as a mood stabilizer and management for depression and recheck the patient in 2 weeks. I have agreed that he should not be driving. I have also agreed that there should not be loaded weapons in the home. However I agreed that the patient should be asked  and talked to is a human being and that showing him disrespect may help mollify some of his anger and frustration. Patient and wife agree to try  09/13/17 Patient is here today with his wife for follow-up.  His memory seems to be worsening dramatically.  He recently met with an occupational therapist, however, who has cleared him to drive back and forth to the grocery store with his wife present in the car.  However I am concerned by his apparently worsening memory.  I performed a Mini-Mental status exam today.  The patient is able to tell me the date correctly although it takes him a little bit of time.  He is able to tell me the location quickly.  He is able to remember 3 out of 3 objects quickly however on recall he can only recall 2 out of the 3.  Patient has a difficult time performing serial sevens.  He is able to perform for correctly out of 5 however it takes him repeated prompts and multiple attempts to make the correct answer and this is a patient who has a graduate degree in mathematics.  I performed testing on pattern recognition drawing a line from a to 1, 1 to B, B to 2.  Patient was unable to recognize the pattern despite explaining to me what the pattern was and that the numbers correspond to the previous letter.  He was unable to then draw the correct line to the next letter and number in that pattern.  On clock drawing, he correctly draws a circle with the correct numbers however they are all jumbled at the end with poor spacing.  He then forgot the time I told him to draw.  All of this to me in a patient with a graduate college degree shows early short-term memory problems and slight cognitive impairment.  I believe this is primarily due to vascular dementia from his numerous strokes coupled with possible MS.  They are here today to discuss treatment options Past Medical History:  Diagnosis Date  . Arthritis    s/p TKR  . Bladder cancer (HCC)    [facility], [facility] low grade papillary urothileal  carcinoma  . BPH (benign prostatic hyperplasia)   . Brugada syndrome    Possible Type II Brugada ECG pattern. No family history of SCD, no syncope, no tachypalpitations.  Marland Kitchen CAD (coronary artery disease)    a. LHC 1/15 - mid LCx 50 and 60%, proximal RCA 50%  . Chronic diastolic CHF (congestive heart failure) ([place])   . CKD (chronic kidney disease)   . COPD (chronic obstructive pulmonary disease) ([place])    Prior heavy smoker. PFTs (3/11): FVC 87%, FEV1 73%, ratio 0.57, DLCO 75%, TLC 121%. Moderate obstructive defect. PFTs (9/15): Only minimal obstruction, sugPrior heavy smoker. PFTs (3/11): FVC 87%, FEV1 73%, ratio 0.57, DLCO 75%, TLC 121%. Moderate obstructive defect. PFTs (9/15) minimal obstruction, poss asthma component   . Depression    with bipolar tendencies  . DOE (dyspnea on exertion)    a. Myoview 8/09- EF 57%, no ischemia // b. Myoview (3/11) EF 68%,  diaphragmatic attenuation, no ischemia //  c Echo (4/11) EF 76-19%, mild diastolic dysfunction, PASP 38 mmHg // d. PFTs 9/15 minimal obstruction  /  e. CT negative for ILD  . GERD (gastroesophageal reflux disease)   . History of Doppler ultrasound    Carotid US 3/11 negative for significant stenosis  //  carotid US 6/17: Mild bilateral plaque, 1-39% ICA  . History of echocardiogram    a. Echo 12/14 Inferior and distal septal HK, mild LVH, EF 50-55%, mild MR, mild LAE  //  b. Echo 6/17: Mild focal basal septal hypertrophy, EF 60-65%, normal wall motion, grade 1 diastolic dysfunction, mild AI  . HLD (hyperlipidemia)   . Hypertensive heart disease with CHF (congestive heart failure) ([place]) 04/08/2009  . Low testosterone   . Lung nodule    a. CT in 2015 >> PET in 12/15 not sugg of malignancy // CT 6/19: continue to look benign  . LVH (left ventricular hypertrophy)    a. Echo 12/14 Inferior and distal septal HK, mild LVH, EF 50-55%, mild MR, mild LAE  . Mild dementia   . Mitral regurgitation    Echo (4/11) with PISA ERO 0.3 cm^2 and  regurgitant volume 41 mL (moderate MR). Echo (12/14) with mild MR.   . MS (multiple sclerosis) ([place])   . MS (multiple sclerosis) ([place])   . OSA (obstructive sleep apnea)   . Right bundle branch block   . Stroke ([place])   . Thoracic aortic aneurysm (HCC)    4.1 cm 2017 // Chest CTA 6/19: stable ascending thoracic aortic aneurysm 4.1 cm, aortic atherosclerosis, stable RML nodular densities (appears benign), emphysema >> FU 1 year  . Urinary retention with incomplete bladder emptying    receives botox injections and treatment for BPH through [facility]   Past Surgical History:  Procedure Laterality Date  . APPENDECTOMY    . EP IMPLANTABLE DEVICE N/A 06/23/2015   Procedure: Loop Recorder Insertion;  Surgeon: Deboraha Sprang, MD;  Location: [place] CV LAB;  Service: Cardiovascular;  Laterality: N/A;  . HEMORRHOID SURGERY    . LEFT HEART CATHETERIZATION WITH CORONARY ANGIOGRAM N/A 01/05/2013   Procedure: LEFT HEART CATHETERIZATION WITH CORONARY ANGIOGRAM;  Surgeon: Larey Dresser, MD;  Location: [facility] CATH LAB;  Service: Cardiovascular;  Laterality: N/A;  . ROTATOR CUFF REPAIR    . TEE WITHOUT CARDIOVERSION N/A 06/23/2015   Procedure: TRANSESOPHAGEAL ECHOCARDIOGRAM (TEE);  Surgeon: Fay Records, MD;  Location: [facility] ENDOSCOPY;  Service: Cardiovascular;  Laterality: N/A;  . TONSILLECTOMY    . TOTAL KNEE ARTHROPLASTY Right   . TRANSURETHRAL RESECTION OF PROSTATE     Current Outpatient Medications on File Prior to Visit  Medication Sig Dispense Refill  . atorvastatin (LIPITOR) 40 MG tablet TAKE 1 TABLET DAILY 90 tablet 2  . Biotin 1000 MCG tablet Take 1,000 mcg by mouth 3 (three) times daily.    . cholecalciferol (VITAMIN D) 400 units TABS tablet Take 400 Units by mouth daily.     . Cyanocobalamin (B-12) 1000 MCG CAPS Take 1,000 mcg by mouth daily.     Marland Kitchen doxazosin (CARDURA) 2 MG tablet TAKE 1 TABLET DAILY 90 tablet 1  . folic acid (FOLVITE) 1 MG tablet Take 1 mg by mouth daily.    . furosemide (LASIX)  40 MG tablet TAKE 1 TABLET(40 MG) BY MOUTH DAILY 90 tablet 1  . gabapentin (NEURONTIN) 100 MG capsule Take 200 mg by mouth 2 (two) times daily. Patient taking 33m twice daily    .  HYDROcodone-acetaminophen (NORCO) 10-325 MG tablet Take 1 tablet by mouth every 6 (six) hours as needed for moderate pain or severe pain (DX: G89.4, (G35 - MS)). 100 tablet 0  . MAGNESIUM CITRATE PO Take 0.5-1 Bottles by mouth once as needed (for mild constipation).     . metoprolol succinate (TOPROL-XL) 100 MG 24 hr tablet Take 1 1/2 tablets (150 mg total) by mouth daily. 135 tablet 3  . pantoprazole (PROTONIX) 40 MG tablet TAKE 1 TABLET DAILY 90 tablet 3  . PROAIR HFA 108 (90 Base) MCG/ACT inhaler USE 1 TO 2 INHALATIONS EVERY 6 HOURS AS NEEDED FOR WHEEZING OR SHORTNESS OF BREATH 25.5 g 4  . prochlorperazine (COMPAZINE) 25 MG suppository Place 25 mg rectally every 12 (twelve) hours as needed for nausea or vomiting.    . ARIPiprazole (ABILIFY) 5 MG tablet Take 1 tablet (5 mg total) by mouth daily. (Patient taking differently: Take 2.5 mg by mouth 2 (two) times daily. ) 30 tablet 3  . ELIQUIS 5 MG TABS tablet TAKE 1 TABLET TWICE A DAY 180 tablet 3  . losartan (COZAAR) 100 MG tablet Take 1 tablet (100 mg total) by mouth daily. 90 tablet 3  . rOPINIRole (REQUIP) 1 MG tablet TAKE 1 TABLET AT BEDTIME 90 tablet 3  . sucralfate (CARAFATE) 1 GM/10ML suspension Take 10 mLs (1 g total) by mouth 4 (four) times daily -  with meals and at bedtime. 420 mL 0  . Tiotropium Bromide-Olodaterol (STIOLTO RESPIMAT) 2.5-2.5 MCG/ACT AERS Inhale 2 puffs into the lungs daily. 1 Inhaler 11  . venlafaxine XR (EFFEXOR-XR) 150 MG 24 hr capsule Take 1 capsule (150 mg total) by mouth 2 (two) times daily. 180 capsule 3   No current facility-administered medications on file prior to visit.    Allergies  Allergen Reactions  . Alcohol-Sulfur [Sulfur] Other (See Comments)    Patient was suspected of having "Brugada Syndrome"  . Bupivacaine Other (See  Comments)    Patient was suspected of having "Brugada Syndrome"  . Clomipramine Hcl Other (See Comments)    Patient was suspected of having "Brugada Syndrome"  . Flecainide Other (See Comments)    Patient was suspected of having "Brugada Syndrome"  . Lithium Other (See Comments)    Patient was suspected of having "Brugada Syndrome"  . Acetylcholine     Patient was suspected of having "Brugada Syndrome": (BrS) is a genetic condition that results in abnormal electrical activity within the heart, increasing the risk of sudden cardiac death. Those affected may have episodes of passing out.  . Amitriptyline Other (See Comments)    Patient was suspected of having "Brugada Syndrome"  . Cocaine Other (See Comments)    Patient was suspected of having "Brugada Syndrome"  . Desipramine Other (See Comments)    Patient was suspected of having "Brugada Syndrome"  . Ergonovine Other (See Comments)    Patient was suspected of having "Brugada Syndrome"  . Loxapine Other (See Comments)    Patient was suspected of having "Brugada Syndrome"  . Nortriptyline Other (See Comments)    Patient was suspected of having "Brugada Syndrome"  . Oxcarbazepine Other (See Comments)    Patient was suspected of having "Brugada Syndrome"  . Procainamide Other (See Comments)    Patient was suspected of having "Brugada Syndrome"  . Procaine Other (See Comments)    Patient was suspected of having "Brugada Syndrome"  . Propafenone Other (See Comments)    Patient was suspected of having "Brugada Syndrome"  . Propofol Other (  See Comments)    Patient was suspected of having "Brugada Syndrome"  . Trifluoperazine Other (See Comments)    Patient was suspected of having "Brugada Syndrome"   Social History   Socioeconomic History  . Marital status: Married    Spouse name: Not on file  . Number of children: Not on file  . Years of education: Not on file  . Highest education level: Not on file  Occupational History  .  Occupation: retired    Fish farm manager: RETIRED  Social Needs  . Financial resource strain: Not on file  . Food insecurity:    Worry: Not on file    Inability: Not on file  . Transportation needs:    Medical: Not on file    Non-medical: Not on file  Tobacco Use  . Smoking status: Former Smoker    Packs/day: 2.00    Years: 40.00    Pack years: 80.00    Types: Cigarettes    Last attempt to quit: 01/04/1993    Years since quitting: 24.7  . Smokeless tobacco: Former Systems developer    Types: Chew  Substance and Sexual Activity  . Alcohol use: Yes    Alcohol/week: 25.0 standard drinks    Types: 25 Cans of beer per week    Comment: 12 pack a week  . Drug use: No  . Sexual activity: Not on file  Lifestyle  . Physical activity:    Days per week: Not on file    Minutes per session: Not on file  . Stress: Not on file  Relationships  . Social connections:    Talks on phone: Not on file    Gets together: Not on file    Attends religious service: Not on file    Active member of club or organization: Not on file    Attends meetings of clubs or organizations: Not on file    Relationship status: Not on file  . Intimate partner violence:    Fear of current or ex partner: Not on file    Emotionally abused: Not on file    Physically abused: Not on file    Forced sexual activity: Not on file  Other Topics Concern  . Not on file  Social History Narrative   Retired from the [employer] after 20 years   [place] Veteran      Review of Systems  All other systems reviewed and are negative.      Objective:   Physical Exam  Constitutional: He is oriented to person, place, and time. He appears well-developed and well-nourished. No distress.  Cardiovascular: Normal rate, regular rhythm, normal heart sounds and intact distal pulses.  No murmur heard. Pulmonary/Chest: Effort normal and breath sounds normal. No respiratory distress. He has no wheezes. He has no rales.  Neurological: He is alert and  oriented to person, place, and time. He has normal reflexes. No cranial nerve deficit. He exhibits normal muscle tone. Coordination normal.  Skin: He is not diaphoretic.  Psychiatric: He has a normal mood and affect. His behavior is normal. Judgment and thought content normal.  Vitals reviewed.         Assessment & Plan:  Memory loss - Plan: COMPLETE METABOLIC PANEL WITH GFR, CBC with Differential/Platelet, Vitamin B12, TSH  Need for prophylactic vaccination and inoculation against influenza - Plan: Flu Vaccine QUAD 36+ mos IM  Vascular dementia without behavioral disturbance  Mood swings  Cryptogenic stroke (HCC)  Cognitive impairment I have recommended that the patient begin Aricept  5 mg a day and I would like to recheck him in 1 month and uptitrate to 10 mg if tolerable.  I will check a CBC, CMP, vitamin B12, and TSH.  I believe the patient is suffering from a combination of vascular dementia as well as his underlying MS. I have also recommended avoiding polypharmacy and I have strongly encouraged the wife to manage his medications.  I want to limit his pain medication as much as possible.  He can no longer self administer the medication.  I have recommended that his wife hold the medication and give it to him only when he is in significant pain.  Otherwise I have encouraged Tylenol.  I believe the patient has poor impulse control and frequently may take 2 or 3 pain pills at a time when he is hurting which I believe further exacerbates his underlying cognitive impairment.  Reassess in 1 month

## 2017-09-14 LAB — COMPLETE METABOLIC PANEL WITH GFR
AG RATIO: 1.5 (calc) (ref 1.0–2.5)
ALKALINE PHOSPHATASE (APISO): 77 U/L (ref 40–115)
ALT: 21 U/L (ref 9–46)
AST: 25 U/L (ref 10–35)
Albumin: 4.4 g/dL (ref 3.6–5.1)
BILIRUBIN TOTAL: 0.3 mg/dL (ref 0.2–1.2)
BUN / CREAT RATIO: 26 (calc) — AB (ref 6–22)
BUN: 32 mg/dL — AB (ref 7–25)
CHLORIDE: 101 mmol/L (ref 98–110)
CO2: 27 mmol/L (ref 20–32)
Calcium: 9.9 mg/dL (ref 8.6–10.3)
Creat: 1.21 mg/dL — ABNORMAL HIGH (ref 0.70–1.18)
GFR, Est African American: 67 mL/min/{1.73_m2} (ref >=60)
GFR, Est Non African American: 58 mL/min/{1.73_m2} — ABNORMAL LOW (ref >=60)
GLOBULIN: 3 g/dL (ref 1.9–3.7)
GLUCOSE: 87 mg/dL (ref 65–99)
POTASSIUM: 5.1 mmol/L (ref 3.5–5.3)
SODIUM: 138 mmol/L (ref 135–146)
Total Protein: 7.4 g/dL (ref 6.1–8.1)

## 2017-09-14 LAB — CBC WITH DIFFERENTIAL/PLATELET
BASOS ABS: 108 {cells}/uL (ref 0–200)
Basophils Relative: 1.1 %
EOS PCT: 6.9 %
Eosinophils Absolute: 676 cells/uL — ABNORMAL HIGH (ref 15–500)
HCT: 41.9 % (ref 38.5–50.0)
Hemoglobin: 14.5 g/dL (ref 13.2–17.1)
Lymphs Abs: 1970 cells/uL (ref 850–3900)
MCH: 32.6 pg (ref 27.0–33.0)
MCHC: 34.6 g/dL (ref 32.0–36.0)
MCV: 94.2 fL (ref 80.0–100.0)
MPV: 11.4 fL (ref 7.5–12.5)
Monocytes Relative: 9.8 %
NEUTROS PCT: 62.1 %
Neutro Abs: 6086 cells/uL (ref 1500–7800)
PLATELETS: 271 10*3/uL (ref 140–400)
RBC: 4.45 10*6/uL (ref 4.20–5.80)
RDW: 13 % (ref 11.0–15.0)
TOTAL LYMPHOCYTE: 20.1 %
WBC mixed population: 960 cells/uL — ABNORMAL HIGH (ref 200–950)
WBC: 9.8 10*3/uL (ref 3.8–10.8)

## 2017-09-14 LAB — TSH: TSH: 2 mIU/L (ref 0.40–4.50)

## 2017-09-14 LAB — VITAMIN B12: Vitamin B-12: 1292 pg/mL — ABNORMAL HIGH (ref 200–1100)

## 2017-09-21 ENCOUNTER — Telehealth: Payer: Self-pay | Admitting: Family Medicine

## 2017-09-21 ENCOUNTER — Other Ambulatory Visit: Payer: Self-pay | Admitting: Family Medicine

## 2017-09-21 MED ORDER — ARIPIPRAZOLE 5 MG PO TABS: 5.0000 mg | ORAL_TABLET | Freq: Every day | ORAL | 3 refills | Status: DC

## 2017-09-21 NOTE — Telephone Encounter (Signed)
Patient is requesting a refill on Hydrocodone   LOV: 09/13/16  LRF:   07/05/17

## 2017-09-21 NOTE — Telephone Encounter (Signed)
[facility] [address]  Patients wife called saying it is emergent that he get his abilify refilled asap  He is out and she did not realize it, yesterday was a really bad day for him not taking his medication

## 2017-09-21 NOTE — Telephone Encounter (Signed)
Medication called/sent to requested pharmacy and pt aware 

## 2017-09-22 MED ORDER — HYDROCODONE-ACETAMINOPHEN 10-325 MG PO TABS: 1.0000 | ORAL_TABLET | Freq: Four times a day (QID) | ORAL | 0 refills | Status: DC | PRN

## 2017-09-22 NOTE — Telephone Encounter (Signed)
Sent [name] [name] message on [place] informing her of decrease medication

## 2017-09-22 NOTE — Telephone Encounter (Signed)
[name] [name] had discussed decreasing the amount of the pain medication with him at last ov.  Lets decrease from 100 tabs to 60 tabs.  Please notify [name] [name] and Marden Noble.

## 2017-09-27 ENCOUNTER — Telehealth: Payer: Self-pay | Admitting: Family Medicine

## 2017-09-27 MED ORDER — HYDROCODONE-ACETAMINOPHEN 10-325 MG PO TABS: 1.0000 | ORAL_TABLET | Freq: Four times a day (QID) | ORAL | 0 refills | Status: DC | PRN

## 2017-09-27 NOTE — Telephone Encounter (Signed)
Can you send his RX for pain med to [facility] it was accidentally sent to mail order.

## 2017-09-29 ENCOUNTER — Telehealth: Payer: Self-pay

## 2017-09-29 NOTE — Telephone Encounter (Signed)
LMOVM requesting that pt send manual transmission b/c home monitor has not updated in at least 14 days.    

## 2017-09-30 LAB — CUP PACEART REMOTE DEVICE CHECK
Implantable Pulse Generator Implant Date: 20170619
MDC IDC SESS DTM: 20190901201055

## 2017-10-07 ENCOUNTER — Ambulatory Visit (INDEPENDENT_AMBULATORY_CARE_PROVIDER_SITE_OTHER): Payer: Medicare Other | Admitting: *Deleted

## 2017-10-07 DIAGNOSIS — I639 Cerebral infarction, unspecified: Secondary | ICD-10-CM | POA: Diagnosis not present

## 2017-10-08 NOTE — Progress Notes (Signed)
Carelink Summary Report / Loop Recorder 

## 2017-10-11 LAB — CUP PACEART REMOTE DEVICE CHECK
Date Time Interrogation Session: 20191004200837
MDC IDC PG IMPLANT DT: 20170619

## 2017-10-13 ENCOUNTER — Ambulatory Visit (INDEPENDENT_AMBULATORY_CARE_PROVIDER_SITE_OTHER): Payer: Medicare Other | Admitting: Family Medicine

## 2017-10-13 ENCOUNTER — Encounter: Payer: Self-pay | Admitting: Family Medicine

## 2017-10-13 VITALS — BP 150/90 | HR 80 | Temp 98.0°F | Resp 20 | Ht 71.0 in | Wt 244.0 lb

## 2017-10-13 DIAGNOSIS — I639 Cerebral infarction, unspecified: Secondary | ICD-10-CM

## 2017-10-13 DIAGNOSIS — R413 Other amnesia: Secondary | ICD-10-CM

## 2017-10-13 MED ORDER — DONEPEZIL HCL 10 MG PO TABS: 10.0000 mg | ORAL_TABLET | Freq: Every day | ORAL | 3 refills | Status: DC

## 2017-10-13 MED ORDER — DONEPEZIL HCL 10 MG PO TABS
10.0000 mg | ORAL_TABLET | Freq: Every day | ORAL | 3 refills | Status: DC
Start: 2017-10-13 — End: 2017-10-13

## 2017-10-13 NOTE — Addendum Note (Signed)
Addended by: Jenna Luo T on: 10/13/2017 03:51 PM   Modules accepted: Orders

## 2017-10-13 NOTE — Progress Notes (Signed)
Subjective:    Patient ID: [name] [name], male    DOB: 1940-06-07, 77 y.o.   MRN: [id number]  HPI10/9/18 Wife brings him patient for evaluation to determine should he still be driving. Past medical history is significant for progressive multiple sclerosis, multiple ischemic strokes, chronic osteoarthritic pain on 3 time a day hydrocodone WHICH contribute to this discussion. Physically, the patient has muscle strength 5 over 5 equal and symmetric in the arms and in the legs with normal reflexes. His physical reaction time is normal to catching a dropped object. Cerebellar exam is normal today however on Mini-Mental status exam, the patient tells me that the year is 1983. He is unable to correctly identify the month. He can #2 out of 3 objects on recall. He scores 3 out of 5 on world spelling in reverse. He is unable to perform serial sevens. It takes him 25 seconds to draw a clock although he does draw correctly. However he is unable to recognize patterns or to correctly copy intersecting pentagons.  At that time, my plan was: Patient has age-related memory loss compounded by multiple ischemic strokes, compounded by progressive multiple sclerosis, compounded by polypharmacy and chronic narcotic use all of which makes him a danger to drive. In general, I recommended that the patient not drive. Legally, I believe he would still qualify for restricted status. He must drive less than 45 miles an hour. He must avoid Interstate driving or driving on highways and remain driving only on 2 lane roads where there is less travel. I would not recommend driving at night or during conditions that would compromise his reaction time. After discussing these restrictions I explained to the patient and his wife that if he were my father, I would not recommend driving.  90/38/33 Patient's wife removed his car and had it placed at her daughter's home. This angered the patient dramatically. Furthermore, the patient's wife  is concerned that he is extremely depressed and that he may harm himself. She also feels threatened and the home due to his outbursts of uncontrolled anger. Therefore she had her daughter and son-in-law will remove the guns from his home. Patient feels extremely upset by this. He feels disrespected. He feels violated. Patient's wife states that he is extremely angry. His mood swings are rapid and fluctuating. He is extremely depressed. He reports anhedonia. He is frustrated by his memory loss. He is frustrated by his inability to drive. He is frustrated by having his personal items removed from his home without his consent. He feels like he is losing his independence and his masculinity. In the past he has had symptoms of depression with bipolar tendencies. At one time we had him on Depakote as a mood stabilizer which seemed to help dramatically. He is also taking Abilify but this was discontinued due to weight gain. Patient's wife feels unsafe at home. Patient denies suicidality. Patient denies any homicidal ideation. He states that his wife is blowing things out of proportion.  At that time, my plan was: I feel both the patient and his wife have valid arguments. The wife does not feel safe with him having guns in the home nor does she feel safe with him driving. Patient is frustrated by his loss of independence and the perceived disrespect of having his guns and car taken from him without his consent. Wife states that she did not consent the patient because she was afraid of his uncontrolled anger when she broached the subject with him couple  of his memory loss and poor judgment and impulsivity. Therefore I will treat the patient with Abilify 5 mg by mouth daily as a mood stabilizer and management for depression and recheck the patient in 2 weeks. I have agreed that he should not be driving. I have also agreed that there should not be loaded weapons in the home. However I agreed that the patient should be asked  and talked to is a human being and that showing him disrespect may help mollify some of his anger and frustration. Patient and wife agree to try  09/13/17 Patient is here today with his wife for follow-up.  His memory seems to be worsening dramatically.  He recently met with an occupational therapist, however, who has cleared him to drive back and forth to the grocery store with his wife present in the car.  However I am concerned by his apparently worsening memory.  I performed a Mini-Mental status exam today.  The patient is able to tell me the date correctly although it takes him a little bit of time.  He is able to tell me the location quickly.  He is able to remember 3 out of 3 objects quickly however on recall he can only recall 2 out of the 3.  Patient has a difficult time performing serial sevens.  He is able to perform for correctly out of 5 however it takes him repeated prompts and multiple attempts to make the correct answer and this is a patient who has a graduate degree in mathematics.  I performed testing on pattern recognition drawing a line from a to 1, 1 to B, B to 2.  Patient was unable to recognize the pattern despite explaining to me what the pattern was and that the numbers correspond to the previous letter.  He was unable to then draw the correct line to the next letter and number in that pattern.  On clock drawing, he correctly draws a circle with the correct numbers however they are all jumbled at the end with poor spacing.  He then forgot the time I told him to draw.  All of this to me in a patient with a graduate college degree shows early short-term memory problems and slight cognitive impairment.  I believe this is primarily due to vascular dementia from his numerous strokes coupled with possible MS.  They are here today to discuss treatment options.  At that time, my plan was: I have recommended that the patient begin Aricept 5 mg a day and I would like to recheck him in 1 month and  uptitrate to 10 mg if tolerable.  I will check a CBC, CMP, vitamin B12, and TSH.  I believe the patient is suffering from a combination of vascular dementia as well as his underlying MS. I have also recommended avoiding polypharmacy and I have strongly encouraged the wife to manage his medications.  I want to limit his pain medication as much as possible.  He can no longer self administer the medication.  I have recommended that his wife hold the medication and give it to him only when he is in significant pain.  Otherwise I have encouraged Tylenol.  I believe the patient has poor impulse control and frequently may take 2 or 3 pain pills at a time when he is hurting which I believe further exacerbates his underlying cognitive impairment.  Reassess in 1 month  10/13/17 Patient is tolerating Aricept 5 mg a day.  He denies any abnormal dreams,  hallucinations, stomach upset, dizziness, or depression.  Obviously, patient nor his family can tell any difference.  Both he and his wife are arguing throughout the visit today.  His wife is essentially trying to keep him at home.  She will not let him drive a car which I agree with.  She will not let him operate a lot more.  She is not letting him go to the gym unless she accompanies him.  Although this leaves the patient feeling suffocated and stifled.  This leads to arguments and frustration which are causing significant problems in the marriage. Past Medical History:  Diagnosis Date  . Arthritis    s/p TKR  . Bladder cancer (HCC)    [facility], [facility] low grade papillary urothileal carcinoma  . BPH (benign prostatic hyperplasia)   . Brugada syndrome    Possible Type II Brugada ECG pattern. No family history of SCD, no syncope, no tachypalpitations.  Marland Kitchen CAD (coronary artery disease)    a. LHC 1/15 - mid LCx 50 and 60%, proximal RCA 50%  . Chronic diastolic CHF (congestive heart failure) ([place])   . CKD (chronic kidney disease)   . COPD (chronic obstructive pulmonary  disease) ([place])    Prior heavy smoker. PFTs (3/11): FVC 87%, FEV1 73%, ratio 0.57, DLCO 75%, TLC 121%. Moderate obstructive defect. PFTs (9/15): Only minimal obstruction, sugPrior heavy smoker. PFTs (3/11): FVC 87%, FEV1 73%, ratio 0.57, DLCO 75%, TLC 121%. Moderate obstructive defect. PFTs (9/15) minimal obstruction, poss asthma component   . Depression    with bipolar tendencies  . DOE (dyspnea on exertion)    a. Myoview 8/09- EF 57%, no ischemia // b. Myoview (3/11) EF 68%, diaphragmatic attenuation, no ischemia //  c Echo (4/11) EF 47-42%, mild diastolic dysfunction, PASP 38 mmHg // d. PFTs 9/15 minimal obstruction  /  e. CT negative for ILD  . GERD (gastroesophageal reflux disease)   . History of Doppler ultrasound    Carotid US 3/11 negative for significant stenosis  //  carotid US 6/17: Mild bilateral plaque, 1-39% ICA  . History of echocardiogram    a. Echo 12/14 Inferior and distal septal HK, mild LVH, EF 50-55%, mild MR, mild LAE  //  b. Echo 6/17: Mild focal basal septal hypertrophy, EF 60-65%, normal wall motion, grade 1 diastolic dysfunction, mild AI  . HLD (hyperlipidemia)   . Hypertensive heart disease with CHF (congestive heart failure) ([place]) 04/08/2009  . Low testosterone   . Lung nodule    a. CT in 2015 >> PET in 12/15 not sugg of malignancy // CT 6/19: continue to look benign  . LVH (left ventricular hypertrophy)    a. Echo 12/14 Inferior and distal septal HK, mild LVH, EF 50-55%, mild MR, mild LAE  . Mild dementia ([place])   . Mitral regurgitation    Echo (4/11) with PISA ERO 0.3 cm^2 and regurgitant volume 41 mL (moderate MR). Echo (12/14) with mild MR.   . MS (multiple sclerosis) ([place])   . MS (multiple sclerosis) ([place])   . OSA (obstructive sleep apnea)   . Right bundle branch block   . Stroke ([place])   . Thoracic aortic aneurysm (HCC)    4.1 cm 2017 // Chest CTA 6/19: stable ascending thoracic aortic aneurysm 4.1 cm, aortic atherosclerosis, stable RML nodular densities  (appears benign), emphysema >> FU 1 year  . Urinary retention with incomplete bladder emptying    receives botox injections and treatment for BPH through [facility]   Past Surgical  History:  Procedure Laterality Date  . APPENDECTOMY    . EP IMPLANTABLE DEVICE N/A 06/23/2015   Procedure: Loop Recorder Insertion;  Surgeon: Deboraha Sprang, MD;  Location: [place] [facility];  Service: Cardiovascular;  Laterality: N/A;  . HEMORRHOID SURGERY    . LEFT HEART CATHETERIZATION WITH CORONARY ANGIOGRAM N/A 01/05/2013   Procedure: LEFT HEART CATHETERIZATION WITH CORONARY ANGIOGRAM;  Surgeon: Larey Dresser, MD;  Location: [facility] CATH LAB;  Service: Cardiovascular;  Laterality: N/A;  . ROTATOR CUFF REPAIR    . TEE WITHOUT CARDIOVERSION N/A 06/23/2015   Procedure: TRANSESOPHAGEAL ECHOCARDIOGRAM (TEE);  Surgeon: Fay Records, MD;  Location: [facility] ENDOSCOPY;  Service: Cardiovascular;  Laterality: N/A;  . TONSILLECTOMY    . TOTAL KNEE ARTHROPLASTY Right   . TRANSURETHRAL RESECTION OF PROSTATE     Current Outpatient Medications on File Prior to Visit  Medication Sig Dispense Refill  . ARIPiprazole (ABILIFY) 5 MG tablet Take 1 tablet (5 mg total) by mouth daily. 30 tablet 3  . atorvastatin (LIPITOR) 40 MG tablet TAKE 1 TABLET DAILY 90 tablet 2  . Biotin 1000 MCG tablet Take 1,000 mcg by mouth 3 (three) times daily.    . cholecalciferol (VITAMIN D) 400 units TABS tablet Take 400 Units by mouth daily.     . Cyanocobalamin (B-12) 1000 MCG CAPS Take 1,000 mcg by mouth daily.     Marland Kitchen doxazosin (CARDURA) 2 MG tablet TAKE 1 TABLET DAILY 90 tablet 1  . ELIQUIS 5 MG TABS tablet TAKE 1 TABLET TWICE A DAY 997 tablet 3  . folic acid (FOLVITE) 1 MG tablet Take 1 mg by mouth daily.    . furosemide (LASIX) 40 MG tablet TAKE 1 TABLET(40 MG) BY MOUTH DAILY 90 tablet 1  . gabapentin (NEURONTIN) 100 MG capsule Take 200 mg by mouth 2 (two) times daily. Patient taking 374m twice daily    . HYDROcodone-acetaminophen (NORCO) 10-325 MG tablet  Take 1 tablet by mouth every 6 (six) hours as needed for moderate pain or severe pain (DX: G89.4, (G35 - MS)). 60 tablet 0  . MAGNESIUM CITRATE PO Take 0.5-1 Bottles by mouth once as needed (for mild constipation).     . metoprolol succinate (TOPROL-XL) 100 MG 24 hr tablet Take 1 1/2 tablets (150 mg total) by mouth daily. 135 tablet 3  . pantoprazole (PROTONIX) 40 MG tablet TAKE 1 TABLET DAILY 90 tablet 3  . PROAIR HFA 108 (90 Base) MCG/ACT inhaler USE 1 TO 2 INHALATIONS EVERY 6 HOURS AS NEEDED FOR WHEEZING OR SHORTNESS OF BREATH 25.5 g 4  . prochlorperazine (COMPAZINE) 25 MG suppository Place 25 mg rectally every 12 (twelve) hours as needed for nausea or vomiting.    .Marland KitchenrOPINIRole (REQUIP) 1 MG tablet TAKE 1 TABLET AT BEDTIME 90 tablet 3  . sucralfate (CARAFATE) 1 GM/10ML suspension Take 10 mLs (1 g total) by mouth 4 (four) times daily -  with meals and at bedtime. 420 mL 0  . Tiotropium Bromide-Olodaterol (STIOLTO RESPIMAT) 2.5-2.5 MCG/ACT AERS Inhale 2 puffs into the lungs daily. 1 Inhaler 11  . venlafaxine XR (EFFEXOR-XR) 150 MG 24 hr capsule Take 1 capsule (150 mg total) by mouth 2 (two) times daily. 180 capsule 3  . donepezil (ARICEPT) 10 MG tablet Take 1 tablet (10 mg total) by mouth at bedtime. 90 tablet 3  . losartan (COZAAR) 100 MG tablet Take 1 tablet (100 mg total) by mouth daily. 90 tablet 3   No current facility-administered medications on file prior  to visit.    Allergies  Allergen Reactions  . Alcohol-Sulfur [Sulfur] Other (See Comments)    Patient was suspected of having "Brugada Syndrome"  . Bupivacaine Other (See Comments)    Patient was suspected of having "Brugada Syndrome"  . Clomipramine Hcl Other (See Comments)    Patient was suspected of having "Brugada Syndrome"  . Flecainide Other (See Comments)    Patient was suspected of having "Brugada Syndrome"  . Lithium Other (See Comments)    Patient was suspected of having "Brugada Syndrome"  . Acetylcholine     Patient  was suspected of having "Brugada Syndrome": (BrS) is a genetic condition that results in abnormal electrical activity within the heart, increasing the risk of sudden cardiac death. Those affected may have episodes of passing out.  . Amitriptyline Other (See Comments)    Patient was suspected of having "Brugada Syndrome"  . Cocaine Other (See Comments)    Patient was suspected of having "Brugada Syndrome"  . Desipramine Other (See Comments)    Patient was suspected of having "Brugada Syndrome"  . Ergonovine Other (See Comments)    Patient was suspected of having "Brugada Syndrome"  . Loxapine Other (See Comments)    Patient was suspected of having "Brugada Syndrome"  . Nortriptyline Other (See Comments)    Patient was suspected of having "Brugada Syndrome"  . Oxcarbazepine Other (See Comments)    Patient was suspected of having "Brugada Syndrome"  . Procainamide Other (See Comments)    Patient was suspected of having "Brugada Syndrome"  . Procaine Other (See Comments)    Patient was suspected of having "Brugada Syndrome"  . Propafenone Other (See Comments)    Patient was suspected of having "Brugada Syndrome"  . Propofol Other (See Comments)    Patient was suspected of having "Brugada Syndrome"  . Trifluoperazine Other (See Comments)    Patient was suspected of having "Brugada Syndrome"   Social History   Socioeconomic History  . Marital status: Married    Spouse name: Not on file  . Number of children: Not on file  . Years of education: Not on file  . Highest education level: Not on file  Occupational History  . Occupation: retired    Fish farm manager: RETIRED  Social Needs  . Financial resource strain: Not on file  . Food insecurity:    Worry: Not on file    Inability: Not on file  . Transportation needs:    Medical: Not on file    Non-medical: Not on file  Tobacco Use  . Smoking status: Former Smoker    Packs/day: 2.00    Years: 40.00    Pack years: 80.00    Types:  Cigarettes    Last attempt to quit: 01/04/1993    Years since quitting: 24.7  . Smokeless tobacco: Former Systems developer    Types: Chew  Substance and Sexual Activity  . Alcohol use: Yes    Alcohol/week: 25.0 standard drinks    Types: 25 Cans of beer per week    Comment: 12 pack a week  . Drug use: No  . Sexual activity: Not on file  Lifestyle  . Physical activity:    Days per week: Not on file    Minutes per session: Not on file  . Stress: Not on file  Relationships  . Social connections:    Talks on phone: Not on file    Gets together: Not on file    Attends religious service: Not on file  Active member of club or organization: Not on file    Attends meetings of clubs or organizations: Not on file    Relationship status: Not on file  . Intimate partner violence:    Fear of current or ex partner: Not on file    Emotionally abused: Not on file    Physically abused: Not on file    Forced sexual activity: Not on file  Other Topics Concern  . Not on file  Social History Narrative   Retired from the [employer] after 20 years   [place] Veteran      Review of Systems  All other systems reviewed and are negative.      Objective:   Physical Exam  Constitutional: He is oriented to person, place, and time. He appears well-developed and well-nourished. No distress.  Cardiovascular: Normal rate, regular rhythm, normal heart sounds and intact distal pulses.  No murmur heard. Pulmonary/Chest: Effort normal and breath sounds normal. No respiratory distress. He has no wheezes. He has no rales.  Neurological: He is alert and oriented to person, place, and time. He has normal reflexes. No cranial nerve deficit. He exhibits normal muscle tone. Coordination normal.  Skin: He is not diaphoretic.  Psychiatric: He has a normal mood and affect. His behavior is normal. Judgment and thought content normal.  Vitals reviewed.         Assessment & Plan:  Memory loss Increase Aricept to 10 mg  a day.  I discussed with the wife allowing the patient time to be away from her.  I agree that he should not be driving a car.  However I see no serious risk in him mowing the yard at his current state.  I also believe that if she can have a family member drive him to the gym 2 to 3 days a week to allow him to get exercise this would give him an outlet to release his energy and frustration as well as provide her therapeutic break to help prevent and treat caregiver fatigue.

## 2017-10-27 ENCOUNTER — Ambulatory Visit: Payer: Medicare Other | Admitting: Family Medicine

## 2017-11-02 ENCOUNTER — Encounter: Payer: Self-pay | Admitting: Family Medicine

## 2017-11-09 ENCOUNTER — Other Ambulatory Visit: Payer: Self-pay | Admitting: Family Medicine

## 2017-11-09 ENCOUNTER — Ambulatory Visit (INDEPENDENT_AMBULATORY_CARE_PROVIDER_SITE_OTHER): Payer: Medicare Other | Admitting: *Deleted

## 2017-11-09 DIAGNOSIS — I639 Cerebral infarction, unspecified: Secondary | ICD-10-CM | POA: Diagnosis not present

## 2017-11-09 NOTE — Telephone Encounter (Signed)
Refill on hydrocodone to [facility]

## 2017-11-09 NOTE — Telephone Encounter (Signed)
Patient is requesting a refill on Hydrocodone   LOV:  10/13/17  LRF:   09/27/17

## 2017-11-10 ENCOUNTER — Ambulatory Visit: Payer: Medicare Other | Admitting: Nurse Practitioner

## 2017-11-10 ENCOUNTER — Telehealth: Payer: Self-pay

## 2017-11-10 MED ORDER — HYDROCODONE-ACETAMINOPHEN 10-325 MG PO TABS: 1.0000 | ORAL_TABLET | Freq: Four times a day (QID) | ORAL | 0 refills | Status: DC | PRN

## 2017-11-10 NOTE — Progress Notes (Signed)
Carelink Summary Report / Loop Recorder 

## 2017-11-10 NOTE — Telephone Encounter (Signed)
LMOVM requesting that pt send manual transmission b/c home monitor has not updated in at least 14 days.    

## 2017-11-15 ENCOUNTER — Ambulatory Visit: Payer: Medicare Other | Admitting: Family Medicine

## 2017-11-16 ENCOUNTER — Emergency Department (HOSPITAL_COMMUNITY): Payer: Medicare Other

## 2017-11-16 ENCOUNTER — Inpatient Hospital Stay (HOSPITAL_COMMUNITY)
Admission: EM | Admit: 2017-11-16 | Discharge: 2017-11-18 | DRG: 884 | Disposition: A | Discharge status: Home Health | Payer: Medicare Other | Attending: Internal Medicine | Admitting: Internal Medicine

## 2017-11-16 ENCOUNTER — Encounter: Payer: Self-pay | Admitting: Cardiology

## 2017-11-16 ENCOUNTER — Other Ambulatory Visit: Payer: Self-pay

## 2017-11-16 ENCOUNTER — Observation Stay (HOSPITAL_COMMUNITY): Payer: Medicare Other

## 2017-11-16 ENCOUNTER — Encounter (HOSPITAL_COMMUNITY): Payer: Self-pay | Admitting: Emergency Medicine

## 2017-11-16 DIAGNOSIS — G9340 Encephalopathy, unspecified: Secondary | ICD-10-CM | POA: Diagnosis present

## 2017-11-16 DIAGNOSIS — I712 Thoracic aortic aneurysm, without rupture: Secondary | ICD-10-CM | POA: Diagnosis present

## 2017-11-16 DIAGNOSIS — Z8551 Personal history of malignant neoplasm of bladder: Secondary | ICD-10-CM

## 2017-11-16 DIAGNOSIS — Z823 Family history of stroke: Secondary | ICD-10-CM

## 2017-11-16 DIAGNOSIS — I498 Other specified cardiac arrhythmias: Secondary | ICD-10-CM | POA: Diagnosis present

## 2017-11-16 DIAGNOSIS — Z8051 Family history of malignant neoplasm of kidney: Secondary | ICD-10-CM

## 2017-11-16 DIAGNOSIS — Z87891 Personal history of nicotine dependence: Secondary | ICD-10-CM

## 2017-11-16 DIAGNOSIS — Z8673 Personal history of transient ischemic attack (TIA), and cerebral infarction without residual deficits: Secondary | ICD-10-CM

## 2017-11-16 DIAGNOSIS — N189 Chronic kidney disease, unspecified: Secondary | ICD-10-CM | POA: Diagnosis present

## 2017-11-16 DIAGNOSIS — F0390 Unspecified dementia without behavioral disturbance: Secondary | ICD-10-CM | POA: Diagnosis not present

## 2017-11-16 DIAGNOSIS — Z888 Allergy status to other drugs, medicaments and biological substances status: Secondary | ICD-10-CM

## 2017-11-16 DIAGNOSIS — Z884 Allergy status to anesthetic agent status: Secondary | ICD-10-CM

## 2017-11-16 DIAGNOSIS — I5032 Chronic diastolic (congestive) heart failure: Secondary | ICD-10-CM | POA: Diagnosis not present

## 2017-11-16 DIAGNOSIS — E785 Hyperlipidemia, unspecified: Secondary | ICD-10-CM | POA: Diagnosis present

## 2017-11-16 DIAGNOSIS — Z79899 Other long term (current) drug therapy: Secondary | ICD-10-CM

## 2017-11-16 DIAGNOSIS — J449 Chronic obstructive pulmonary disease, unspecified: Secondary | ICD-10-CM | POA: Diagnosis present

## 2017-11-16 DIAGNOSIS — G35 Multiple sclerosis: Secondary | ICD-10-CM | POA: Diagnosis present

## 2017-11-16 DIAGNOSIS — Z882 Allergy status to sulfonamides status: Secondary | ICD-10-CM

## 2017-11-16 DIAGNOSIS — R4182 Altered mental status, unspecified: Secondary | ICD-10-CM

## 2017-11-16 DIAGNOSIS — I10 Essential (primary) hypertension: Secondary | ICD-10-CM | POA: Diagnosis not present

## 2017-11-16 DIAGNOSIS — I4510 Unspecified right bundle-branch block: Secondary | ICD-10-CM | POA: Diagnosis present

## 2017-11-16 DIAGNOSIS — G4489 Other headache syndrome: Secondary | ICD-10-CM | POA: Diagnosis not present

## 2017-11-16 DIAGNOSIS — K219 Gastro-esophageal reflux disease without esophagitis: Secondary | ICD-10-CM | POA: Diagnosis present

## 2017-11-16 DIAGNOSIS — G4733 Obstructive sleep apnea (adult) (pediatric): Secondary | ICD-10-CM | POA: Diagnosis present

## 2017-11-16 DIAGNOSIS — N401 Enlarged prostate with lower urinary tract symptoms: Secondary | ICD-10-CM | POA: Diagnosis present

## 2017-11-16 DIAGNOSIS — R29810 Facial weakness: Secondary | ICD-10-CM | POA: Diagnosis present

## 2017-11-16 DIAGNOSIS — Z7901 Long term (current) use of anticoagulants: Secondary | ICD-10-CM

## 2017-11-16 DIAGNOSIS — R4189 Other symptoms and signs involving cognitive functions and awareness: Secondary | ICD-10-CM | POA: Diagnosis present

## 2017-11-16 DIAGNOSIS — Z66 Do not resuscitate: Secondary | ICD-10-CM | POA: Diagnosis present

## 2017-11-16 DIAGNOSIS — I130 Hypertensive heart and chronic kidney disease with heart failure and stage 1 through stage 4 chronic kidney disease, or unspecified chronic kidney disease: Secondary | ICD-10-CM | POA: Diagnosis not present

## 2017-11-16 DIAGNOSIS — Z8249 Family history of ischemic heart disease and other diseases of the circulatory system: Secondary | ICD-10-CM

## 2017-11-16 DIAGNOSIS — I340 Nonrheumatic mitral (valve) insufficiency: Secondary | ICD-10-CM | POA: Diagnosis present

## 2017-11-16 DIAGNOSIS — R410 Disorientation, unspecified: Secondary | ICD-10-CM | POA: Diagnosis not present

## 2017-11-16 DIAGNOSIS — I2510 Atherosclerotic heart disease of native coronary artery without angina pectoris: Secondary | ICD-10-CM | POA: Diagnosis present

## 2017-11-16 LAB — CBC WITH DIFFERENTIAL/PLATELET
ABS IMMATURE GRANULOCYTES: 0.03 10*3/uL (ref 0.00–0.07)
BASOS ABS: 0.1 10*3/uL (ref 0.0–0.1)
Basophils Relative: 1 %
Eosinophils Absolute: 0.1 10*3/uL (ref 0.0–0.5)
Eosinophils Relative: 1 %
HCT: 43 % (ref 39.0–52.0)
HEMOGLOBIN: 13.8 g/dL (ref 13.0–17.0)
Immature Granulocytes: 0 %
LYMPHS PCT: 14 %
Lymphs Abs: 1.3 10*3/uL (ref 0.7–4.0)
MCH: 31.7 pg (ref 26.0–34.0)
MCHC: 32.1 g/dL (ref 30.0–36.0)
MCV: 98.9 fL (ref 80.0–100.0)
MONO ABS: 0.9 10*3/uL (ref 0.1–1.0)
Monocytes Relative: 10 %
Neutro Abs: 7 10*3/uL (ref 1.7–7.7)
Neutrophils Relative %: 74 %
Platelets: 234 10*3/uL (ref 150–400)
RBC: 4.35 MIL/uL (ref 4.22–5.81)
RDW: 13.3 % (ref 11.5–15.5)
WBC: 9.4 10*3/uL (ref 4.0–10.5)
nRBC: 0 % (ref 0.0–0.2)

## 2017-11-16 LAB — URINALYSIS, ROUTINE W REFLEX MICROSCOPIC
BILIRUBIN URINE: NEGATIVE
Bacteria, UA: NONE SEEN
Glucose, UA: NEGATIVE mg/dL
HGB URINE DIPSTICK: NEGATIVE
Ketones, ur: 5 mg/dL — AB
Leukocytes, UA: NEGATIVE
NITRITE: NEGATIVE
PH: 6 (ref 5.0–8.0)
Protein, ur: 30 mg/dL — AB
SPECIFIC GRAVITY, URINE: 1.016 (ref 1.005–1.030)

## 2017-11-16 LAB — COMPREHENSIVE METABOLIC PANEL
ALBUMIN: 3.8 g/dL (ref 3.5–5.0)
ALK PHOS: 66 U/L (ref 38–126)
ALT: 28 U/L (ref 0–44)
AST: 32 U/L (ref 15–41)
Anion gap: 11 (ref 5–15)
BILIRUBIN TOTAL: 0.8 mg/dL (ref 0.3–1.2)
BUN: 13 mg/dL (ref 8–23)
CALCIUM: 9 mg/dL (ref 8.9–10.3)
CO2: 24 mmol/L (ref 22–32)
CREATININE: 0.96 mg/dL (ref 0.61–1.24)
Chloride: 99 mmol/L (ref 98–111)
GFR calc Af Amer: >60 mL/min (ref >60)
GFR calc non Af Amer: >60 mL/min (ref >60)
Glucose, Bld: 111 mg/dL — ABNORMAL HIGH (ref 70–99)
Potassium: 4 mmol/L (ref 3.5–5.1)
SODIUM: 134 mmol/L — AB (ref 135–145)
TOTAL PROTEIN: 7 g/dL (ref 6.5–8.1)

## 2017-11-16 LAB — ETHANOL

## 2017-11-16 LAB — TROPONIN I: Troponin I: <0.03 ng/mL (ref <0.03)

## 2017-11-16 MED ORDER — HYDROCODONE-ACETAMINOPHEN 10-325 MG PO TABS: 1.0000 | ORAL_TABLET | Freq: Four times a day (QID) | ORAL | Status: DC | PRN

## 2017-11-16 MED ORDER — PANTOPRAZOLE SODIUM 40 MG PO TBEC
40.0000 mg | DELAYED_RELEASE_TABLET | Freq: Every day | ORAL | Status: DC
  Administered 2017-11-17 – 2017-11-18 (×2): 40 mg via ORAL
  Filled 2017-11-16 (×2): qty 1

## 2017-11-16 MED ORDER — ACETAMINOPHEN 650 MG RE SUPP: 650.0000 mg | RECTAL | Status: DC | PRN

## 2017-11-16 MED ORDER — UMECLIDINIUM BROMIDE 62.5 MCG/INH IN AEPB
1.0000 | INHALATION_SPRAY | Freq: Every day | RESPIRATORY_TRACT | Status: DC
  Administered 2017-11-17 – 2017-11-18 (×2): 1 via RESPIRATORY_TRACT
  Filled 2017-11-16 (×2): qty 7

## 2017-11-16 MED ORDER — ACETAMINOPHEN 160 MG/5ML PO SOLN: 650.0000 mg | ORAL | Status: DC | PRN

## 2017-11-16 MED ORDER — LORAZEPAM 1 MG PO TABS: 1.0000 mg | ORAL_TABLET | Freq: Four times a day (QID) | ORAL | Status: DC | PRN

## 2017-11-16 MED ORDER — APIXABAN 5 MG PO TABS
5.0000 mg | ORAL_TABLET | Freq: Two times a day (BID) | ORAL | Status: DC
  Administered 2017-11-17 – 2017-11-18 (×3): 5 mg via ORAL
  Filled 2017-11-16 (×3): qty 1

## 2017-11-16 MED ORDER — ROPINIROLE HCL 1 MG PO TABS
1.0000 mg | ORAL_TABLET | Freq: Every day | ORAL | Status: DC
  Administered 2017-11-17: 1 mg via ORAL
  Filled 2017-11-16: qty 1

## 2017-11-16 MED ORDER — ARIPIPRAZOLE 10 MG PO TABS
5.0000 mg | ORAL_TABLET | Freq: Every day | ORAL | Status: DC
  Administered 2017-11-17 – 2017-11-18 (×2): 5 mg via ORAL
  Filled 2017-11-16 (×2): qty 1

## 2017-11-16 MED ORDER — FOLIC ACID 1 MG PO TABS
1.0000 mg | ORAL_TABLET | Freq: Once | ORAL | Status: AC
  Administered 2017-11-16: 1 mg via ORAL
  Filled 2017-11-16: qty 1

## 2017-11-16 MED ORDER — LORAZEPAM 2 MG/ML IJ SOLN: 1.0000 mg | Freq: Once | INTRAMUSCULAR | Status: DC | PRN

## 2017-11-16 MED ORDER — SODIUM CHLORIDE 0.9 % IV BOLUS
1000.0000 mL | Freq: Once | INTRAVENOUS | Status: AC
  Administered 2017-11-16: 1000 mL via INTRAVENOUS

## 2017-11-16 MED ORDER — SENNOSIDES-DOCUSATE SODIUM 8.6-50 MG PO TABS
1.0000 | ORAL_TABLET | Freq: Every evening | ORAL | Status: DC | PRN
  Administered 2017-11-17: 1 via ORAL
  Filled 2017-11-16: qty 1

## 2017-11-16 MED ORDER — VITAMIN B-1 100 MG PO TABS
100.0000 mg | ORAL_TABLET | Freq: Every day | ORAL | Status: DC
  Administered 2017-11-17: 100 mg via ORAL
  Filled 2017-11-16: qty 1

## 2017-11-16 MED ORDER — THIAMINE HCL 100 MG/ML IJ SOLN
100.0000 mg | Freq: Every day | INTRAMUSCULAR | Status: DC
  Filled 2017-11-16: qty 2

## 2017-11-16 MED ORDER — ARFORMOTEROL TARTRATE 15 MCG/2ML IN NEBU
15.0000 ug | INHALATION_SOLUTION | Freq: Two times a day (BID) | RESPIRATORY_TRACT | Status: DC
  Administered 2017-11-17 – 2017-11-18 (×3): 15 ug via RESPIRATORY_TRACT
  Filled 2017-11-16 (×5): qty 2

## 2017-11-16 MED ORDER — GABAPENTIN 300 MG PO CAPS
300.0000 mg | ORAL_CAPSULE | Freq: Two times a day (BID) | ORAL | Status: DC
  Administered 2017-11-17 – 2017-11-18 (×3): 300 mg via ORAL
  Filled 2017-11-16 (×4): qty 1

## 2017-11-16 MED ORDER — ALBUTEROL SULFATE (2.5 MG/3ML) 0.083% IN NEBU: 2.5000 mg | INHALATION_SOLUTION | Freq: Four times a day (QID) | RESPIRATORY_TRACT | Status: DC | PRN

## 2017-11-16 MED ORDER — LORAZEPAM 2 MG/ML IJ SOLN
1.0000 mg | Freq: Once | INTRAMUSCULAR | Status: AC | PRN
  Administered 2017-11-17: 1 mg via INTRAVENOUS
  Filled 2017-11-16 (×2): qty 1

## 2017-11-16 MED ORDER — STROKE: EARLY STAGES OF RECOVERY BOOK
Freq: Once | Status: DC
  Filled 2017-11-16 (×2): qty 1

## 2017-11-16 MED ORDER — FOLIC ACID 1 MG PO TABS: 1.0000 mg | ORAL_TABLET | Freq: Every day | ORAL | Status: DC

## 2017-11-16 MED ORDER — VENLAFAXINE HCL ER 150 MG PO CP24
150.0000 mg | ORAL_CAPSULE | Freq: Two times a day (BID) | ORAL | Status: DC
  Administered 2017-11-17 – 2017-11-18 (×3): 150 mg via ORAL
  Filled 2017-11-16: qty 1
  Filled 2017-11-16: qty 2
  Filled 2017-11-16: qty 1
  Filled 2017-11-16 (×2): qty 2
  Filled 2017-11-16 (×2): qty 1

## 2017-11-16 MED ORDER — ADULT MULTIVITAMIN W/MINERALS CH
1.0000 | ORAL_TABLET | Freq: Every day | ORAL | Status: DC
  Administered 2017-11-17 – 2017-11-18 (×2): 1 via ORAL
  Filled 2017-11-16 (×2): qty 1

## 2017-11-16 MED ORDER — DONEPEZIL HCL 10 MG PO TABS
10.0000 mg | ORAL_TABLET | Freq: Every day | ORAL | Status: DC
  Administered 2017-11-17: 10 mg via ORAL
  Filled 2017-11-16: qty 1

## 2017-11-16 MED ORDER — HALOPERIDOL LACTATE 5 MG/ML IJ SOLN: 2.0000 mg | Freq: Four times a day (QID) | INTRAMUSCULAR | Status: DC | PRN

## 2017-11-16 MED ORDER — ATORVASTATIN CALCIUM 40 MG PO TABS
40.0000 mg | ORAL_TABLET | Freq: Every day | ORAL | Status: DC
  Administered 2017-11-17 – 2017-11-18 (×2): 40 mg via ORAL
  Filled 2017-11-16 (×2): qty 1

## 2017-11-16 MED ORDER — METOPROLOL TARTRATE 25 MG PO TABS
100.0000 mg | ORAL_TABLET | Freq: Once | ORAL | Status: AC
  Administered 2017-11-16: 100 mg via ORAL
  Filled 2017-11-16: qty 4

## 2017-11-16 MED ORDER — VITAMIN B-1 100 MG PO TABS
50.0000 mg | ORAL_TABLET | Freq: Once | ORAL | Status: AC
  Administered 2017-11-16: 50 mg via ORAL
  Filled 2017-11-16: qty 1

## 2017-11-16 MED ORDER — PROCHLORPERAZINE 25 MG RE SUPP
25.0000 mg | Freq: Two times a day (BID) | RECTAL | Status: DC | PRN
  Filled 2017-11-16: qty 1

## 2017-11-16 MED ORDER — ACETAMINOPHEN 325 MG PO TABS: 650.0000 mg | ORAL_TABLET | ORAL | Status: DC | PRN

## 2017-11-16 MED ORDER — LORAZEPAM 2 MG/ML IJ SOLN
1.0000 mg | Freq: Four times a day (QID) | INTRAMUSCULAR | Status: DC | PRN
  Administered 2017-11-16: 1 mg via INTRAVENOUS

## 2017-11-16 MED ORDER — FOLIC ACID 1 MG PO TABS
1.0000 mg | ORAL_TABLET | Freq: Every day | ORAL | Status: DC
  Administered 2017-11-17 – 2017-11-18 (×2): 1 mg via ORAL
  Filled 2017-11-16 (×2): qty 1

## 2017-11-16 NOTE — ED Notes (Signed)
Report given to Tynan on 3W and was made aware that pt is getting an MRI done first

## 2017-11-16 NOTE — ED Notes (Signed)
Family at bedside. 

## 2017-11-16 NOTE — ED Notes (Signed)
Pt was up walking in the hall with his IV line stretched out.  He is not able to tell this nurse where he is going.  He was putting his slippers on the foot that he already have a slipper on.  He denies any complaints.

## 2017-11-16 NOTE — H&P (Signed)
History and Physical    [name] [name] OJJ:[id number] DOB: 07-12-1940 DOA: 11/16/2017  PCP: [name], MD  Patient coming from: Home  I have personally briefly reviewed patient's old medical records in [place]  Chief Complaint: AMS  HPI: [name] [name] is a 77 y.o. male with medical history significant of dementia, cryptogenic stroke s/p ILR placement and on eliquis, Brugada pattern on ECG, COPD, MS, non occlusive CAD.  Patient sent in to ED by wife with c/o confusion and agitation.  Per wife patient has been increasingly confused, with agitation and aggression at times since Monday.  No PO meds since Monday.  Patient unfortunately isnt oriented and not able to provide much history.  He does deny pain, weakness, dysuria, CP, SOB, abd pain.   ED Course: During ED course patient noted to be hypertensive persistently.  Also reported by EDP to have waxing and waning focal neurologic deficits of R sided weakness and facial droop.  He is also constantly confused.   Review of Systems: Negative, but not really able to perform due to AMS.  Right now patient state he just needs to use the bathroom.  Past Medical History:  Diagnosis Date  . Arthritis    s/p TKR  . Bladder cancer (HCC)    [facility], [facility] low grade papillary urothileal carcinoma  . BPH (benign prostatic hyperplasia)   . Brugada syndrome    Possible Type II Brugada ECG pattern. No family history of SCD, no syncope, no tachypalpitations.  [place] Kitchen CAD (coronary artery disease)    a. LHC 1/15 - mid LCx 50 and 60%, proximal RCA 50%  . Chronic diastolic CHF (congestive heart failure) ([place])   . CKD (chronic kidney disease)   . COPD (chronic obstructive pulmonary disease) ([place])    Prior heavy smoker. PFTs (3/11): FVC 87%, FEV1 73%, ratio 0.57, DLCO 75%, TLC 121%. Moderate obstructive defect. PFTs (9/15): Only minimal obstruction, sugPrior heavy smoker. PFTs (3/11): FVC 87%, FEV1 73%, ratio 0.57, DLCO 75%, TLC 121%.  Moderate obstructive defect. PFTs (9/15) minimal obstruction, poss asthma component   . Depression    with bipolar tendencies  . DOE (dyspnea on exertion)    a. Myoview 8/09- EF 57%, no ischemia // b. Myoview (3/11) EF 68%, diaphragmatic attenuation, no ischemia //  c Echo (4/11) EF 93-71%, mild diastolic dysfunction, PASP 38 mmHg // d. PFTs 9/15 minimal obstruction  /  e. CT negative for ILD  . GERD (gastroesophageal reflux disease)   . History of Doppler ultrasound    Carotid US 3/11 negative for significant stenosis  //  carotid US 6/17: Mild bilateral plaque, 1-39% ICA  . History of echocardiogram    a. Echo 12/14 Inferior and distal septal HK, mild LVH, EF 50-55%, mild MR, mild LAE  //  b. Echo 6/17: Mild focal basal septal hypertrophy, EF 60-65%, normal wall motion, grade 1 diastolic dysfunction, mild AI  . HLD (hyperlipidemia)   . Hypertensive heart disease with CHF (congestive heart failure) ([place]) 04/08/2009  . Low testosterone   . Lung nodule    a. CT in 2015 >> PET in 12/15 not sugg of malignancy // CT 6/19: continue to look benign  . LVH (left ventricular hypertrophy)    a. Echo 12/14 Inferior and distal septal HK, mild LVH, EF 50-55%, mild MR, mild LAE  . Mild dementia ([place])   . Mitral regurgitation    Echo (4/11) with PISA ERO 0.3 cm^2 and regurgitant volume 41 mL (moderate MR).  Echo (12/14) with mild MR.   . MS (multiple sclerosis) ([place])   . MS (multiple sclerosis) ([place])   . OSA (obstructive sleep apnea)   . Right bundle branch block   . Stroke ([place])   . Thoracic aortic aneurysm (HCC)    4.1 cm 2017 // Chest CTA 6/19: stable ascending thoracic aortic aneurysm 4.1 cm, aortic atherosclerosis, stable RML nodular densities (appears benign), emphysema >> FU 1 year  . Urinary retention with incomplete bladder emptying    receives botox injections and treatment for BPH through [facility]    Past Surgical History:  Procedure Laterality Date  . APPENDECTOMY    . EP IMPLANTABLE  DEVICE N/A 06/23/2015   Procedure: Loop Recorder Insertion;  Surgeon: Deboraha Sprang, MD;  Location: [place] CV LAB;  Service: Cardiovascular;  Laterality: N/A;  . HEMORRHOID SURGERY    . LEFT HEART CATHETERIZATION WITH CORONARY ANGIOGRAM N/A 01/05/2013   Procedure: LEFT HEART CATHETERIZATION WITH CORONARY ANGIOGRAM;  Surgeon: Larey Dresser, MD;  Location: [facility] CATH LAB;  Service: Cardiovascular;  Laterality: N/A;  . ROTATOR CUFF REPAIR    . TEE WITHOUT CARDIOVERSION N/A 06/23/2015   Procedure: TRANSESOPHAGEAL ECHOCARDIOGRAM (TEE);  Surgeon: Fay Records, MD;  Location: [facility] ENDOSCOPY;  Service: Cardiovascular;  Laterality: N/A;  . TONSILLECTOMY    . TOTAL KNEE ARTHROPLASTY Right   . TRANSURETHRAL RESECTION OF PROSTATE       reports that he quit smoking about 24 years ago. His smoking use included cigarettes. He has a 80.00 pack-year smoking history. He has quit using smokeless tobacco.  His smokeless tobacco use included chew. He reports that he drinks about 25.0 standard drinks of alcohol per week. He reports that he does not use drugs.  Allergies  Allergen Reactions  . Alcohol-Sulfur [Sulfur] Other (See Comments)    Patient was suspected of having "Brugada Syndrome"  . Bupivacaine Other (See Comments)    Patient was suspected of having "Brugada Syndrome"  . Clomipramine Hcl Other (See Comments)    Patient was suspected of having "Brugada Syndrome"  . Flecainide Other (See Comments)    Patient was suspected of having "Brugada Syndrome"  . Lithium Other (See Comments)    Patient was suspected of having "Brugada Syndrome"  . Acetylcholine     Patient was suspected of having "Brugada Syndrome": (BrS) is a genetic condition that results in abnormal electrical activity within the heart, increasing the risk of sudden cardiac death. Those affected may have episodes of passing out.  . Amitriptyline Other (See Comments)    Patient was suspected of having "Brugada Syndrome"  . Cocaine Other (See  Comments)    Patient was suspected of having "Brugada Syndrome"  . Desipramine Other (See Comments)    Patient was suspected of having "Brugada Syndrome"  . Ergonovine Other (See Comments)    Patient was suspected of having "Brugada Syndrome"  . Loxapine Other (See Comments)    Patient was suspected of having "Brugada Syndrome"  . Nortriptyline Other (See Comments)    Patient was suspected of having "Brugada Syndrome"  . Oxcarbazepine Other (See Comments)    Patient was suspected of having "Brugada Syndrome"  . Procainamide Other (See Comments)    Patient was suspected of having "Brugada Syndrome"  . Procaine Other (See Comments)    Patient was suspected of having "Brugada Syndrome"  . Propafenone Other (See Comments)    Patient was suspected of having "Brugada Syndrome"  . Propofol Other (See Comments)    Patient  was suspected of having "Brugada Syndrome"  . Trifluoperazine Other (See Comments)    Patient was suspected of having "Brugada Syndrome"    Family History  Problem Relation Age of Onset  . Stroke Father   . Stroke Mother   . Hypertension Sister   . Kidney cancer Brother   . Arthritis Brother   . Other Brother        knee problems, Bil TKR  . Hypertension Unknown        family history  . Prostate cancer Neg Hx   . Bladder Cancer Neg Hx      Prior to Admission medications   Medication Sig Start Date End Date Taking? Authorizing Provider  ARIPiprazole (ABILIFY) 5 MG tablet Take 1 tablet (5 mg total) by mouth daily. 09/21/17  Yes [name], MD  atorvastatin (LIPITOR) 40 MG tablet TAKE 1 TABLET DAILY Patient taking differently: Take 40 mg by mouth daily.  06/28/17  Yes Richardson Dopp T, PA-C  Biotin 1000 MCG tablet Take 1,000 mcg by mouth 3 (three) times daily.   Yes [provider]  cholecalciferol (VITAMIN D) 400 units TABS tablet Take 400 Units by mouth daily.    Yes [provider]  Cyanocobalamin (B-12) 1000 MCG CAPS Take 1,000 mcg by  mouth daily.    Yes [provider]  donepezil (ARICEPT) 10 MG tablet Take 1 tablet (10 mg total) by mouth at bedtime. 10/13/17  Yes [name], MD  ELIQUIS 5 MG TABS tablet TAKE 1 TABLET TWICE A DAY 06/28/17  Yes [name], MD  folic acid (FOLVITE) 1 MG tablet Take 1 mg by mouth daily.   Yes [provider]  furosemide (LASIX) 40 MG tablet TAKE 1 TABLET(40 MG) BY MOUTH DAILY Patient taking differently: Take 40 mg by mouth daily.  12/16/16  Yes End, Harrell Gave, MD  gabapentin (NEURONTIN) 300 MG capsule Take 300 mg by mouth 2 (two) times daily. Patient taking 300mg  twice daily   Yes [provider]  HYDROcodone-acetaminophen (NORCO) 10-325 MG tablet Take 1 tablet by mouth every 6 (six) hours as needed for moderate pain or severe pain (DX: G89.4, (G35 - MS)). 11/10/17  Yes [name], MD  MAGNESIUM CITRATE PO Take 0.5-1 Bottles by mouth once as needed (for mild constipation).    Yes [provider]  metoprolol succinate (TOPROL-XL) 100 MG 24 hr tablet Take 1 1/2 tablets (150 mg total) by mouth daily. 03/18/17  Yes End, Harrell Gave, MD  pantoprazole (PROTONIX) 40 MG tablet TAKE 1 TABLET DAILY Patient taking differently: Take 40 mg by mouth daily.  02/14/17  Yes [name], MD  PROAIR HFA 108 [id number] Base) MCG/ACT inhaler USE 1 TO 2 INHALATIONS EVERY 6 HOURS AS NEEDED FOR WHEEZING OR SHORTNESS OF BREATH Patient taking differently: 1-2 puffs every 6 (six) hours as needed for wheezing or shortness of breath.  10/17/15  Yes [name], MD  prochlorperazine (COMPAZINE) 25 MG suppository Place 25 mg rectally every 12 (twelve) hours as needed for nausea or vomiting.   Yes [provider]  rOPINIRole (REQUIP) 1 MG tablet TAKE 1 TABLET AT BEDTIME Patient taking differently: Take 1 mg by mouth at bedtime.  11/17/15  Yes [name], MD  Tiotropium Bromide-Olodaterol (STIOLTO RESPIMAT) 2.5-2.5 MCG/ACT AERS Inhale 2 puffs into the  lungs daily. 06/02/17  Yes Tanda Rockers, MD  venlafaxine XR (EFFEXOR-XR) 150 MG 24 hr capsule Take 1 capsule (150 mg total) by mouth 2 (  two) times daily. 08/23/17  Yes [name], MD  losartan (COZAAR) 100 MG tablet Take 1 tablet (100 mg total) by mouth daily. 08/18/16 07/11/17  Liliane Shi, PA-C    Physical Exam: Vitals:   11/16/17 1715 11/16/17 1730 11/16/17 1745 11/16/17 1800  BP: (!) 193/103 (!) 206/88 (!) 155/122 (!) 167/91  Pulse: 83  71   Resp: 16 13 14    SpO2: 96%  97%     Constitutional: NAD, calm, comfortable Eyes: PERRL, lids and conjunctivae normal ENMT: Mucous membranes are moist. Posterior pharynx clear of any exudate or lesions.Normal dentition.  Neck: normal, supple, no masses, no thyromegaly Respiratory: clear to auscultation bilaterally, no wheezing, no crackles. Normal respiratory effort. No accessory muscle use.  Cardiovascular: Regular rate and rhythm, no murmurs / rubs / gallops. No extremity edema. 2+ pedal pulses. No carotid bruits.  Abdomen: no tenderness, no masses palpated. No hepatosplenomegaly. Bowel sounds positive.  Musculoskeletal: no clubbing / cyanosis. No joint deformity upper and lower extremities. Good ROM, no contractures. Normal muscle tone.  Skin: no rashes, lesions, ulcers. No induration Neurologic: CN 2-12 grossly intact. Sensation intact, DTR normal. Strength 5/5 in all 4. Gait appears steady at time of my exam (patient walking to bathroom). Psychiatric: Cooperative but confused, dosent know the year, oriented to self only.  Right now he is trying to put slippers on to go to the bathroom, has a slipper on a foot and is trying to put a second slipper on the same foot.  Speech clear.   Labs on Admission: I have personally reviewed following labs and imaging studies  CBC: Recent Labs  Lab 11/16/17 1702  WBC 9.4  NEUTROABS 7.0  HGB 13.8  HCT 43.0  MCV 98.9  PLT 102   Basic Metabolic Panel: Recent Labs  Lab 11/16/17 1702  NA  134*  K 4.0  CL 99  CO2 24  GLUCOSE 111*  BUN 13  CREATININE 0.96  CALCIUM 9.0   GFR: CrCl cannot be calculated (Unknown ideal weight.). Liver Function Tests: Recent Labs  Lab 11/16/17 1702  AST 32  ALT 28  ALKPHOS 66  BILITOT 0.8  PROT 7.0  ALBUMIN 3.8   No results for input(s): LIPASE, AMYLASE in the last 168 hours. No results for input(s): AMMONIA in the last 168 hours. Coagulation Profile: No results for input(s): INR, PROTIME in the last 168 hours. Cardiac Enzymes: Recent Labs  Lab 11/16/17 1702  TROPONINI <0.03   BNP (last 3 results) No results for input(s): PROBNP in the last 8760 hours. HbA1C: No results for input(s): HGBA1C in the last 72 hours. CBG: No results for input(s): GLUCAP in the last 168 hours. Lipid Profile: No results for input(s): CHOL, HDL, LDLCALC, TRIG, CHOLHDL, LDLDIRECT in the last 72 hours. Thyroid Function Tests: No results for input(s): TSH, T4TOTAL, FREET4, T3FREE, THYROIDAB in the last 72 hours. Anemia Panel: No results for input(s): VITAMINB12, FOLATE, FERRITIN, TIBC, IRON, RETICCTPCT in the last 72 hours. Urine analysis:    Component Value Date/Time   COLORURINE YELLOW 11/16/2017 1850   APPEARANCEUR CLEAR 11/16/2017 1850   APPEARANCEUR Clear 05/24/2016 1114   LABSPEC 1.016 11/16/2017 1850   PHURINE 6.0 11/16/2017 1850   GLUCOSEU NEGATIVE 11/16/2017 1850   HGBUR NEGATIVE 11/16/2017 1850   BILIRUBINUR NEGATIVE 11/16/2017 1850   BILIRUBINUR Negative 05/24/2016 1114   KETONESUR 5 (A) 11/16/2017 1850   PROTEINUR 30 (A) 11/16/2017 1850   UROBILINOGEN 0.2 07/04/2013 1050   NITRITE NEGATIVE 11/16/2017 1850  LEUKOCYTESUR NEGATIVE 11/16/2017 1850   LEUKOCYTESUR 1+ (A) 05/24/2016 1114    Radiological Exams on Admission: Dg Chest 2 View  Result Date: 11/16/2017 CLINICAL DATA:  77 year old male with altered mental status EXAM: CHEST - 2 VIEW COMPARISON:  Prior chest x-ray 02/15/2017 FINDINGS: Cardiomegaly. Stable widening of  the mediastinum secondary to elongation of the transverse aorta. Implantable loop recorder visualized overlying the left chest. The lungs are clear. No pleural effusion or pneumothorax. No acute osseous abnormality. IMPRESSION: No active cardiopulmonary disease. Stable cardiomegaly. Electronically Signed   By: Jacqulynn Cadet M.D.   On: 11/16/2017 18:29   Ct Head Wo Contrast  Result Date: 11/16/2017 CLINICAL DATA:  77 year old male with dementia, increasing confusion, agitation and aggression. EXAM: CT HEAD WITHOUT CONTRAST TECHNIQUE: Contiguous axial images were obtained from the base of the skull through the vertex without intravenous contrast. COMPARISON:  Prior head CT 10/22/2016 FINDINGS: Brain: No evidence of acute infarction, hemorrhage, hydrocephalus, extra-axial collection or mass lesion/mass effect. Similar pattern of cortical and central atrophy, ex vacuo dilatation of the lateral ventricles. Advanced periventricular, subcortical and deep white matter hypoattenuation most consistent with chronic microvascular ischemic white matter disease, remote lacunar infarct in the right corona radiata. Vascular: No hyperdense vessel or unexpected calcification. Skull: Normal. Negative for fracture or focal lesion. Sinuses/Orbits: No acute finding. Other: Left-sided hearing aid. IMPRESSION: 1. No acute intracranial abnormality. 2. Similar pattern of cortical and central atrophy, ex vacuo dilatation of the lateral ventricles and advanced chronic microvascular ischemic white matter disease. Electronically Signed   By: Jacqulynn Cadet M.D.   On: 11/16/2017 19:48    EKG: Independently reviewed.  Assessment/Plan Principal Problem:   Acute encephalopathy Active Problems:   MS (multiple sclerosis) (HCC)   History of stroke   Cognitive impairment   Essential hypertension    1. Acute encephalopathy - given the waxing and waning reported focal neurologic deficits, given HTN, and given h/o cryptogenic  stroke as well as MS listed in his PMH: 1. Obtaining MRI brain W and W/O contrast, MRA head as well. 2. Remainder of medical work up negative thus far 3. Will need remainder of stroke work up ordered and neuro consult if positive. 4. Tele monitor 5. Frequent neuro checks 6. PT/OT/SLP 2. HTN - 1. Holding home BP meds pending results of MRI 3. Dementia - 1. Resume home meds 4. H/o cryptogenic stroke - 1. Resume eliquis 5. H/o EtOH use - 1. CIWA  DVT prophylaxis: Eliquis Code Status: Full Family Communication: No family in room Disposition Plan: TBD Consults called: None Admission status: Place in obs    [place], [place] [facility] Pager [phone] Only works nights!  If 7AM-7PM, please contact the primary day team physician taking care of patient  [email or web address] Password [email or web address]  11/16/2017, 8:59 PM

## 2017-11-16 NOTE — ED Provider Notes (Signed)
Per pt's wife, he is more agitated and confused - hx of dementia.  Level 5 caveat applies - he is confused to situation - but knows name.  He is cooperative - denies other c/o - On physical, well appearing.  He is following commands, he is able to move all 4 extremities, he is pleasant and interactive but has memory deficits which are significant.  His abdomen is soft, heart and lung exams are unremarkable.  Review of the medical record shows no history of obstructive disease in fact he has nonobstructive cardiac disease based on a heart cath from January 2015.  The patient will need urinalysis, labs, CT scan of the brain, EKG, awaiting family for further information  Has reportedly not had his meds today no family at bedside.  I discussed the care with the hospitalist, Dr. Alcario Drought who will admit. He is hypertensive, given the nighttime meds with some improvement  I have reexamined the patient a couple of times and has had some different exams including some slight right-sided weakness and facial droop which seems to resolve 30 minutes later.  It is unclear what is causing the patient's symptoms but he is at the minimum having a delirium, may be sundowning, may be ischemia  Medical screening examination/treatment/procedure(s) were conducted as a shared visit with non-physician practitioner(s) and myself.  I personally evaluated the patient during the encounter.  Clinical Impression:   Final diagnoses:  Altered mental status, unspecified altered mental status type      Noemi Chapel, MD 11/16/17 2336

## 2017-11-16 NOTE — ED Notes (Signed)
Patient transported to MRI 

## 2017-11-16 NOTE — ED Notes (Signed)
Pt left for xray
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
## 2017-11-16 NOTE — ED Triage Notes (Signed)
Pt BIB [facility], hc dementia, wife reports increased confusion/agitation/aggression since Monday. Pt not getting out of bed and has not had his medications since Monday as well. No neuro deficits noted, hypertensive with EMS.

## 2017-11-16 NOTE — ED Notes (Signed)
Pt is medicated with Ativan as ordered for MRI

## 2017-11-16 NOTE — ED Notes (Signed)
Pt transported to imaging.

## 2017-11-17 ENCOUNTER — Other Ambulatory Visit: Payer: Self-pay

## 2017-11-17 ENCOUNTER — Inpatient Hospital Stay (HOSPITAL_COMMUNITY): Payer: Medicare Other

## 2017-11-17 ENCOUNTER — Ambulatory Visit: Payer: Medicare Other | Admitting: Family Medicine

## 2017-11-17 ENCOUNTER — Encounter (HOSPITAL_COMMUNITY): Payer: Self-pay | Admitting: General Practice

## 2017-11-17 DIAGNOSIS — R410 Disorientation, unspecified: Secondary | ICD-10-CM | POA: Diagnosis not present

## 2017-11-17 DIAGNOSIS — R4182 Altered mental status, unspecified: Secondary | ICD-10-CM | POA: Diagnosis not present

## 2017-11-17 DIAGNOSIS — I2510 Atherosclerotic heart disease of native coronary artery without angina pectoris: Secondary | ICD-10-CM | POA: Diagnosis present

## 2017-11-17 DIAGNOSIS — Z8051 Family history of malignant neoplasm of kidney: Secondary | ICD-10-CM | POA: Diagnosis not present

## 2017-11-17 DIAGNOSIS — I340 Nonrheumatic mitral (valve) insufficiency: Secondary | ICD-10-CM | POA: Diagnosis present

## 2017-11-17 DIAGNOSIS — I498 Other specified cardiac arrhythmias: Secondary | ICD-10-CM | POA: Diagnosis present

## 2017-11-17 DIAGNOSIS — Z8551 Personal history of malignant neoplasm of bladder: Secondary | ICD-10-CM | POA: Diagnosis not present

## 2017-11-17 DIAGNOSIS — R29810 Facial weakness: Secondary | ICD-10-CM | POA: Diagnosis present

## 2017-11-17 DIAGNOSIS — Z823 Family history of stroke: Secondary | ICD-10-CM | POA: Diagnosis not present

## 2017-11-17 DIAGNOSIS — F0390 Unspecified dementia without behavioral disturbance: Secondary | ICD-10-CM | POA: Diagnosis present

## 2017-11-17 DIAGNOSIS — Z87891 Personal history of nicotine dependence: Secondary | ICD-10-CM | POA: Diagnosis not present

## 2017-11-17 DIAGNOSIS — I712 Thoracic aortic aneurysm, without rupture: Secondary | ICD-10-CM | POA: Diagnosis present

## 2017-11-17 DIAGNOSIS — I10 Essential (primary) hypertension: Secondary | ICD-10-CM | POA: Diagnosis not present

## 2017-11-17 DIAGNOSIS — G4733 Obstructive sleep apnea (adult) (pediatric): Secondary | ICD-10-CM | POA: Diagnosis present

## 2017-11-17 DIAGNOSIS — K219 Gastro-esophageal reflux disease without esophagitis: Secondary | ICD-10-CM | POA: Diagnosis present

## 2017-11-17 DIAGNOSIS — E512 Wernicke's encephalopathy: Secondary | ICD-10-CM

## 2017-11-17 DIAGNOSIS — I130 Hypertensive heart and chronic kidney disease with heart failure and stage 1 through stage 4 chronic kidney disease, or unspecified chronic kidney disease: Secondary | ICD-10-CM | POA: Diagnosis present

## 2017-11-17 DIAGNOSIS — N401 Enlarged prostate with lower urinary tract symptoms: Secondary | ICD-10-CM | POA: Diagnosis present

## 2017-11-17 DIAGNOSIS — Z8673 Personal history of transient ischemic attack (TIA), and cerebral infarction without residual deficits: Secondary | ICD-10-CM | POA: Diagnosis not present

## 2017-11-17 DIAGNOSIS — I4510 Unspecified right bundle-branch block: Secondary | ICD-10-CM | POA: Diagnosis present

## 2017-11-17 DIAGNOSIS — G9340 Encephalopathy, unspecified: Secondary | ICD-10-CM | POA: Diagnosis not present

## 2017-11-17 DIAGNOSIS — Z66 Do not resuscitate: Secondary | ICD-10-CM | POA: Diagnosis present

## 2017-11-17 DIAGNOSIS — R4189 Other symptoms and signs involving cognitive functions and awareness: Secondary | ICD-10-CM | POA: Diagnosis not present

## 2017-11-17 DIAGNOSIS — G35 Multiple sclerosis: Secondary | ICD-10-CM | POA: Diagnosis not present

## 2017-11-17 DIAGNOSIS — I5032 Chronic diastolic (congestive) heart failure: Secondary | ICD-10-CM | POA: Diagnosis present

## 2017-11-17 DIAGNOSIS — J449 Chronic obstructive pulmonary disease, unspecified: Secondary | ICD-10-CM | POA: Diagnosis present

## 2017-11-17 DIAGNOSIS — E785 Hyperlipidemia, unspecified: Secondary | ICD-10-CM | POA: Diagnosis present

## 2017-11-17 DIAGNOSIS — N189 Chronic kidney disease, unspecified: Secondary | ICD-10-CM | POA: Diagnosis present

## 2017-11-17 LAB — CBC
HEMATOCRIT: 44.9 % (ref 39.0–52.0)
HEMOGLOBIN: 14.5 g/dL (ref 13.0–17.0)
MCH: 31.7 pg (ref 26.0–34.0)
MCHC: 32.3 g/dL (ref 30.0–36.0)
MCV: 98 fL (ref 80.0–100.0)
NRBC: 0 % (ref 0.0–0.2)
Platelets: 264 10*3/uL (ref 150–400)
RBC: 4.58 MIL/uL (ref 4.22–5.81)
RDW: 13.7 % (ref 11.5–15.5)
WBC: 8.8 10*3/uL (ref 4.0–10.5)

## 2017-11-17 LAB — BASIC METABOLIC PANEL
ANION GAP: 11 (ref 5–15)
BUN: 12 mg/dL (ref 8–23)
CHLORIDE: 102 mmol/L (ref 98–111)
CO2: 23 mmol/L (ref 22–32)
Calcium: 9.1 mg/dL (ref 8.9–10.3)
Creatinine, Ser: 1.02 mg/dL (ref 0.61–1.24)
GFR calc Af Amer: >60 mL/min (ref >60)
GFR calc non Af Amer: >60 mL/min (ref >60)
GLUCOSE: 100 mg/dL — AB (ref 70–99)
Potassium: 3.7 mmol/L (ref 3.5–5.1)
Sodium: 136 mmol/L (ref 135–145)

## 2017-11-17 MED ORDER — LOSARTAN POTASSIUM 50 MG PO TABS
100.0000 mg | ORAL_TABLET | Freq: Every day | ORAL | Status: DC
  Administered 2017-11-17 – 2017-11-18 (×2): 100 mg via ORAL
  Filled 2017-11-17 (×2): qty 2

## 2017-11-17 MED ORDER — SODIUM CHLORIDE 0.9 % IV SOLN
INTRAVENOUS | Status: AC
  Administered 2017-11-17: 20:00:00 via INTRAVENOUS

## 2017-11-17 MED ORDER — THIAMINE HCL 100 MG/ML IJ SOLN: 250.0000 mg | Freq: Every day | INTRAMUSCULAR | Status: DC

## 2017-11-17 MED ORDER — METOPROLOL SUCCINATE ER 100 MG PO TB24
100.0000 mg | ORAL_TABLET | Freq: Every day | ORAL | Status: DC
  Administered 2017-11-17 – 2017-11-18 (×2): 100 mg via ORAL
  Filled 2017-11-17 (×2): qty 1

## 2017-11-17 MED ORDER — THIAMINE HCL 100 MG/ML IJ SOLN
500.0000 mg | Freq: Three times a day (TID) | INTRAVENOUS | Status: DC
  Administered 2017-11-17: 500 mg via INTRAVENOUS
  Filled 2017-11-17 (×4): qty 5

## 2017-11-17 MED ORDER — HYDRALAZINE HCL 20 MG/ML IJ SOLN
10.0000 mg | Freq: Once | INTRAMUSCULAR | Status: AC
  Administered 2017-11-17: 10 mg via INTRAVENOUS
  Filled 2017-11-17: qty 1

## 2017-11-17 NOTE — Evaluation (Signed)
Clinical/Bedside Swallow Evaluation Patient Details  Name: [name] [name] MRN: [id number] Date of Birth: [birth year]  Today's Date: 11/17/2017 Time: SLP Start Time (ACUTE ONLY): 1 SLP Stop Time (ACUTE ONLY): 0945 SLP Time Calculation (min) (ACUTE ONLY): 30 min  Past Medical History:  Past Medical History:  Diagnosis Date  . Arthritis    s/p TKR  . Bladder cancer (HCC)    [facility], [place] low grade papillary urothileal carcinoma  . BPH (benign prostatic hyperplasia)   . Brugada syndrome    Possible Type II Brugada ECG pattern. No family history of SCD, no syncope, no tachypalpitations.  Marland Kitchen CAD (coronary artery disease)    a. LHC 1/15 - mid LCx 50 and 60%, proximal RCA 50%  . Chronic diastolic CHF (congestive heart failure) ([place])   . CKD (chronic kidney disease)   . COPD (chronic obstructive pulmonary disease) ([place])    Prior heavy smoker. PFTs (3/11): FVC 87%, FEV1 73%, ratio 0.57, DLCO 75%, TLC 121%. Moderate obstructive defect. PFTs (9/15): Only minimal obstruction, sugPrior heavy smoker. PFTs (3/11): FVC 87%, FEV1 73%, ratio 0.57, DLCO 75%, TLC 121%. Moderate obstructive defect. PFTs (9/15) minimal obstruction, poss asthma component   . Depression    with bipolar tendencies  . DOE (dyspnea on exertion)    a. Myoview 8/09- EF 57%, no ischemia // b. Myoview (3/11) EF 68%, diaphragmatic attenuation, no ischemia //  c Echo (4/11) EF 67-61%, mild diastolic dysfunction, PASP 38 mmHg // d. PFTs 9/15 minimal obstruction  /  e. CT negative for ILD  . GERD (gastroesophageal reflux disease)   . History of Doppler ultrasound    Carotid US 3/11 negative for significant stenosis  //  carotid US 6/17: Mild bilateral plaque, 1-39% ICA  . History of echocardiogram    a. Echo 12/14 Inferior and distal septal HK, mild LVH, EF 50-55%, mild MR, mild LAE  //  b. Echo 6/17: Mild focal basal septal hypertrophy, EF 60-65%, normal wall motion, grade 1 diastolic dysfunction, mild AI  . HLD (hyperlipidemia)   .  Hypertensive heart disease with CHF (congestive heart failure) ([place]) 04/08/2009  . Low testosterone   . Lung nodule    a. CT in 2015 >> PET in 12/15 not sugg of malignancy // CT 6/19: continue to look benign  . LVH (left ventricular hypertrophy)    a. Echo 12/14 Inferior and distal septal HK, mild LVH, EF 50-55%, mild MR, mild LAE  . Mild dementia ([place])   . Mitral regurgitation    Echo (4/11) with PISA ERO 0.3 cm^2 and regurgitant volume 41 mL (moderate MR). Echo (12/14) with mild MR.   . MS (multiple sclerosis) ([place])   . MS (multiple sclerosis) ([place])   . OSA (obstructive sleep apnea)   . Right bundle branch block   . Stroke ([place])   . Thoracic aortic aneurysm (HCC)    4.1 cm 2017 // Chest CTA 6/19: stable ascending thoracic aortic aneurysm 4.1 cm, aortic atherosclerosis, stable RML nodular densities (appears benign), emphysema >> FU 1 year  . Urinary retention with incomplete bladder emptying    receives botox injections and treatment for BPH through [facility]   Past Surgical History:  Past Surgical History:  Procedure Laterality Date  . APPENDECTOMY    . EP IMPLANTABLE DEVICE N/A 06/23/2015   Procedure: Loop Recorder Insertion;  Surgeon: Deboraha Sprang, MD;  Location: [facility] CV LAB;  Service: Cardiovascular;  Laterality: N/A;  . HEMORRHOID SURGERY    . LEFT HEART CATHETERIZATION  WITH CORONARY ANGIOGRAM N/A 01/05/2013   Procedure: LEFT HEART CATHETERIZATION WITH CORONARY ANGIOGRAM;  Surgeon: Larey Dresser, MD;  Location: [facility] CATH LAB;  Service: Cardiovascular;  Laterality: N/A;  . ROTATOR CUFF REPAIR    . TEE WITHOUT CARDIOVERSION N/A 06/23/2015   Procedure: TRANSESOPHAGEAL ECHOCARDIOGRAM (TEE);  Surgeon: Fay Records, MD;  Location: [facility] ENDOSCOPY;  Service: Cardiovascular;  Laterality: N/A;  . TONSILLECTOMY    . TOTAL KNEE ARTHROPLASTY Right   . TRANSURETHRAL RESECTION OF PROSTATE     HPI:  77 y.o. male with c/o confusion, agitation, and aggression.Marland Kitchen PMH: dementia, cryptogenic  stroke s/p ILR placement and on eliquis, Brugada pattern on ECG, COPD, MS, non occlusive CAD.   Assessment / Plan / Recommendation Clinical Impression  Pt presents with adequate oral motor strength and function. No overt s/s aspiration on any consistency tested. Recommend regular diet with thin liquids. No follow up.   SLP Visit Diagnosis: Dysphagia, unspecified (R13.10)    Aspiration Risk  Mild aspiration risk    Diet Recommendation Regular;Thin liquid   Liquid Administration via: Straw;Cup Medication Administration: Whole meds with liquid Supervision: Patient able to self feed Compensations: Slow rate;Small sips/bites;Minimize environmental distractions Postural Changes: Seated upright at 90 degrees    Other  Recommendations Oral Care Recommendations: Oral care BID   Follow up Recommendations 24 hour supervision/assistance;Skilled Nursing facility          Prognosis Prognosis for Safe Diet Advancement: Good      Swallow Study   General Date of Onset: 11/16/17 HPI: 77 y.o. male with c/o confusion, agitation, and aggression.Marland Kitchen PMH: dementia, cryptogenic stroke s/p ILR placement and on eliquis, Brugada pattern on ECG, COPD, MS, non occlusive CAD. Type of Study: Bedside Swallow Evaluation Previous Swallow Assessment: BSE 2018 - Reg/thin Diet Prior to this Study: NPO Temperature Spikes Noted: No Respiratory Status: Room air History of Recent Intubation: No Behavior/Cognition: Alert;Cooperative;Pleasant mood Oral Cavity Assessment: Within Functional Limits Oral Care Completed by SLP: Yes Oral Cavity - Dentition: Adequate natural dentition Vision: Functional for self-feeding Self-Feeding Abilities: Able to feed self Patient Positioning: Upright in bed Baseline Vocal Quality: Normal Volitional Cough: Strong Volitional Swallow: Able to elicit    Oral/Motor/Sensory Function Overall Oral Motor/Sensory Function: Within functional limits   Ice Chips Ice chips: Not tested    Thin Liquid Thin Liquid: Within functional limits Presentation: Cup;Straw    Nectar Thick Nectar Thick Liquid: Not tested   Honey Thick Honey Thick Liquid: Not tested   Puree Puree: Within functional limits Presentation: Spoon   Solid     Solid: Within functional limits Presentation: Garvin B. Quentin Ore, [facility], [place] Speech Language Pathologist [phone]  Shonna Chock 11/17/2017,10:36 AM

## 2017-11-17 NOTE — Progress Notes (Signed)
Pt off unit to MRI. P. Amo Jadden Yim RN 

## 2017-11-17 NOTE — Progress Notes (Signed)
Attempted unit [facility] Swallow screen test , but pt did not pass, so put order for speech consult.

## 2017-11-17 NOTE — Progress Notes (Signed)
Patient arrived in the unit in a bed at midnight from ED escorted by ED nurse, pt was alert and had period of confusion, verbalized of feeling fine, denied of pain, and seemed relaxed, pt was resting in a bed, set up tele monitor, bed is on lowest position, call bell and personal belongings are within reach, and will continue to monitor closely.

## 2017-11-17 NOTE — Care Management Note (Signed)
Case Management Note  Patient Details  Name: [name] [name] MRN: [id number] Date of Birth: 1940/05/01  Subjective/Objective:    Pt in with acute encephalopathy. He is from home with his spouse.  DME: wheelchair, cane, walker, rollator, seat in shower.         No issues obtaining his medications. Pt uses [facility] through the [place] for most of his meds.  Wife denies transportation issues.  Pt receives 4 hours a day/ 3 days a week of aides services through the [place].         Action/Plan: PT recommending CIR. OT recommending SNF. CM following for d/c disposition.  Expected Discharge Date:                  Expected Discharge Plan:  [place]  In-House Referral:     Discharge planning Services  CM Consult  Post Acute Care Choice:    Choice offered to:     DME Arranged:    DME Agency:     HH Arranged:    [place] Agency:     Status of Service:  In process, will continue to follow  If discussed at Long Length of Stay Meetings, dates discussed:    Additional Comments:  [name] KLOOSTERMAN, RN 11/17/2017, 3:03 PM

## 2017-11-17 NOTE — Evaluation (Signed)
Occupational Therapy Evaluation Patient Details Name: [name] [name] MRN: [id number] DOB: 03-May-1940 Today's Date: 11/17/2017    History of Present Illness Pt is 77 y.o males with a PMH consisting of dementia, stroke, MS, CAD, COPD, and Brugada syndrome reports to the ED with complaints of confusion and agitation since 11/14/2017. Pt also demonstrates R sided weakness and facial droop. MRI on 11/17/2017 was negative, but with considerable pt movement during the exam.    Clinical Impression   Pt admitted with the above diagnoses and presents with below problem list. Pt will benefit from continued acute OT to address the below listed deficits and maximize independence with basic ADLs. PTA pt was independent with ADLs, limited driving. Pt presents with impaired balance, generalized weakness, decreased activity tolerence (+ dizzy EOB and OOB). Pt also presenting with impaired cognition, impulsive, and with decreased safety awareness. Spouse present throughout session.      Follow Up Recommendations  SNF    Equipment Recommendations  Other (comment)(defer to next venue)    Recommendations for Other Services       Precautions / Restrictions Precautions Precautions: Fall Precaution Comments: Impulsive, decreased safety awareness Restrictions Weight Bearing Restrictions: No      Mobility Bed Mobility Overal bed mobility: Needs Assistance Bed Mobility: Supine to Sit Rolling: Min guard   Supine to sit: Min guard     General bed mobility comments: Pt required min guard during bed mobility for safety.    Transfers Overall transfer level: Needs assistance Equipment used: 2 person hand held assist Transfers: Sit to/from [facility] Sit to Stand: Mod assist;+2 physical assistance;Min assist Stand pivot transfers: Min assist;+2 physical assistance       General transfer comment: Pt requires 2 person assist for sit to stand, progressing from mod A to min for power  initiation and stability. Pt reports symptoms of headache during transitional movements. Pt requires verbal cueing throughout for synchronizing movement, including UE placement and turning towards the chair.    Balance Overall balance assessment: Needs assistance Sitting-balance support: Bilateral upper extremity supported;Feet supported Sitting balance-Leahy Scale: Fair     Standing balance support: Bilateral upper extremity supported Standing balance-Leahy Scale: Poor Standing balance comment: external support for balance                           ADL either performed or assessed with clinical judgement   ADL Overall ADL's : Needs assistance/impaired Eating/Feeding: Set up;Sitting   Grooming: Minimal assistance;Standing   Upper Body Bathing: Set up;Sitting   Lower Body Bathing: Moderate assistance;Sit to/from stand;+2 for physical assistance;+2 for safety/equipment   Upper Body Dressing : Moderate assistance;Sitting   Lower Body Dressing: +2 for safety/equipment;Moderate assistance;+2 for physical assistance   Toilet Transfer: Moderate assistance;Ambulation;+2 for safety/equipment Toilet Transfer Details (indicate cue type and reason): observed to impulsively walk to bathroom with chair alarm going off and spouse steadying him until therapist able to enter room. Toileting- Clothing Manipulation and Hygiene: Moderate assistance;Sit to/from stand   Tub/ Banker: Moderate assistance;+2 for safety/equipment   Functional mobility during ADLs: Moderate assistance;+2 for safety/equipment General ADL Comments: Pt completed bed mobility, sat EOB a few minutes then sit<>stand. Stood briefly before sitting back down again citing dizziness. BP assessed. Pt able to take pivotal steps to recliner. Limited session due to dizziness however, pt noted to impulsively get up and walk to bathroom, spouse following behind him and trying to encourage him to wait for help.  Vision         Perception     Praxis      Pertinent Vitals/Pain Pain Assessment: Faces Faces Pain Scale: Hurts little more Pain Location: has been having significant headaches Pain Descriptors / Indicators: Aching Pain Intervention(s): Monitored during session     Hand Dominance Right   Extremity/Trunk Assessment Upper Extremity Assessment Upper Extremity Assessment: Generalized weakness;Overall [facility] for tasks assessed   Lower Extremity Assessment Lower Extremity Assessment: Defer to PT evaluation       Communication Communication Communication: HOH   Cognition Arousal/Alertness: Awake/alert Behavior During Therapy: Impulsive;Restless Overall Cognitive Status: History of cognitive impairments - at baseline                                 General Comments: After therpists exited room, pt getting up to walk to bathroom, disregarding chair alarm, shouting at spouse. Had reported dizziness during standing a few minutes prior.   General Comments  Spouse present throughout session    Exercises     Shoulder Instructions      Home Living Family/patient expects to be discharged to:: Private residence Living Arrangements: Spouse/significant other Available Help at Discharge: Family Type of Home: House Home Access: Level entry     Home Layout: One level     Bathroom Shower/Tub: Occupational psychologist: Standard Bathroom Accessibility: Yes   Home Equipment: Civil engineer, contracting - built in      Lives With: Spouse    Prior Functioning/Environment Level of Independence: Independent with assistive device(s)        Comments: limited driving        OT Problem List: Decreased strength;Decreased activity tolerance;Impaired balance (sitting and/or standing);Decreased cognition;Decreased safety awareness;Decreased knowledge of use of DME or AE;Decreased knowledge of precautions;Pain      OT Treatment/Interventions: Self-care/ADL  training;Therapeutic exercise;Energy conservation;DME and/or AE instruction;Therapeutic activities;Cognitive remediation/compensation;Balance training;Patient/family education    OT Goals(Current goals can be found in the care plan section) Acute Rehab OT Goals OT Goal Formulation: With patient/family Time For Goal Achievement: 12/01/17 Potential to Achieve Goals: Good ADL Goals Pt Will Perform Grooming: with modified independence;sitting;standing Pt Will Perform Upper Body Bathing: with modified independence;sitting Pt Will Perform Lower Body Bathing: with modified independence;sit to/from stand Pt Will Transfer to Toilet: with modified independence;ambulating Pt Will Perform Toileting - Clothing Manipulation and hygiene: with modified independence;sit to/from stand  OT Frequency: Min 2X/week   Barriers to D/C:            Co-evaluation PT/OT/SLP Co-Evaluation/Treatment: Yes Reason for Co-Treatment: Complexity of the patient's impairments (multi-system involvement);For patient/therapist safety;To address functional/ADL transfers PT goals addressed during session: Mobility/safety with mobility;Balance OT goals addressed during session: ADL's and self-care      AM-PAC PT "6 Clicks" Daily Activity     Outcome Measure Help from another person eating meals?: A Little Help from another person taking care of personal grooming?: A Lot Help from another person toileting, which includes using toliet, bedpan, or urinal?: A Lot Help from another person bathing (including washing, rinsing, drying)?: A Lot Help from another person to put on and taking off regular upper body clothing?: A Lot Help from another person to put on and taking off regular lower body clothing?: A Lot 6 Click Score: 13   End of Session Equipment Utilized During Treatment: Gait belt Nurse Communication: Other (comment)(NT: pt on toilet with spouse present (BM), impulsive)  Activity Tolerance: Other (  comment)(+  dizziness EOB and in standing) Patient left: with call bell/phone within reach;with family/visitor present(on toilet with spouse present, NT notified)  OT Visit Diagnosis: Unsteadiness on feet (R26.81);Other abnormalities of gait and mobility (R26.89);Muscle weakness (generalized) (M62.81);Other symptoms and signs involving cognitive function;Pain                Time: 1540-0867 OT Time Calculation (min): 32 min Charges:  OT General Charges $OT Visit: 1 Visit OT Evaluation $OT Eval Low Complexity: [place], OT Acute Rehabilitation Services Pager: [phone] Office: [phone]   Hortencia Pilar 11/17/2017, 2:04 PM

## 2017-11-17 NOTE — Evaluation (Signed)
Physical Therapy Evaluation Patient Details Name: [name] [name] MRN: [id number] DOB: May 24, 1940 Today's Date: 11/17/2017   History of Present Illness  Pt is 77 y.o males with a PMH consisting of dementia, stroke, MS, CAD, COPD, and Brugada syndrome reports to the ED with complaints of confusion and agitation since 11/14/2017. Pt also demonstrates R sided weakness and facial droop. MRI on 11/17/2017 was negative, but with considerable pt movement during the exam.   Clinical Impression  Pt presets supine, HOB elevated, and alert. Pt states willingness to participate in PT. Prior to admission, pt was modified independent with all activities. Pt required rollator walker when walking longer distances, and used an electric cart while in grocery stores. Pt was also unable to drive without supervision from his spouse. Pt is overall min guard for bed mobility, and min-mod A for transfers. Pt states increasing headache and dizziness during transitional movements of supine to sit and sit to stand. Pt demonstrates decreased insight into deficits and safety awareness. Pt would benefit from CIR in order to complete further intensive therapy prior to d/c home with family support. Pt would benefit from continued skilled PT in order to increased strength and functional mobility.   Follow Up Recommendations CIR    Equipment Recommendations  None recommended by PT    Recommendations for Other Services       Precautions / Restrictions Precautions Precautions: Fall Precaution Comments: Impulsive, decreased safety awareness Restrictions Weight Bearing Restrictions: No      Mobility  Bed Mobility Overal bed mobility: Needs Assistance Bed Mobility: Supine to Sit Rolling: Min guard   Supine to sit: Min guard     General bed mobility comments: Pt required min guard during bed mobility for safety.    Transfers Overall transfer level: Needs assistance Equipment used: 2 person hand held  assist Transfers: Sit to/from [facility] Sit to Stand: Mod assist;+2 physical assistance;Min assist Stand pivot transfers: Min assist;+2 physical assistance       General transfer comment: Pt requires 2 person assist for sit to stand, progressing from mod A to min for power initiation and stability. Pt reports symptoms of headache during transitional movements. Pt requires verbal cueing throughout for synchronizing movement, including UE placement and turning towards the chair.  Ambulation/Gait Ambulation/Gait assistance: Min guard Gait Distance (Feet): 20 Feet         General Gait Details: After therapist left the room, pt rose from chair, setting off alarm, and walked toward bathroom with wife standing by. Pt appeared disoriented by the alarm noise, after prior instruction that it would make noise. Therapist then provided min guard A for safety.       Stairs            Wheelchair Mobility    Modified Rankin (Stroke Patients Only)       Balance Overall balance assessment: Needs assistance Sitting-balance support: Bilateral upper extremity supported;Feet supported Sitting balance-Leahy Scale: Fair Sitting balance - Comments: Pt able to maintain sitting balance with min guard throughout. Pt reports increased dizziness and headache during transitional movements and while sitting. BP recorded at 182/95 mmHg.    Standing balance support: Bilateral upper extremity supported Standing balance-Leahy Scale: Poor Standing balance comment: external support for balance                             Pertinent Vitals/Pain Pain Assessment: Faces Faces Pain Scale: Hurts little more Pain Location: has been having  significant headaches Pain Descriptors / Indicators: Aching Pain Intervention(s): Monitored during session    Home Living Family/patient expects to be discharged to:: Private residence Living Arrangements: Spouse/significant other Available Help  at Discharge: Family Type of Home: House Home Access: Level entry     Home Layout: One level Home Equipment: Civil engineer, contracting - built in      Prior Function Level of Independence: Independent with assistive device(s)         Comments: limited driving     Hand Dominance   Dominant Hand: Right    Extremity/Trunk Assessment   Upper Extremity Assessment Upper Extremity Assessment: Generalized weakness;Overall [facility] for tasks assessed    Lower Extremity Assessment Lower Extremity Assessment: Defer to PT evaluation       Communication   Communication: HOH  Cognition Arousal/Alertness: Awake/alert Behavior During Therapy: Impulsive;Restless Overall Cognitive Status: History of cognitive impairments - at baseline Area of Impairment: Following commands;Safety/judgement;Awareness;Problem solving                       Following Commands: Follows one step commands consistently;Follows one step commands with increased time;Follows multi-step commands consistently Safety/Judgement: Decreased awareness of safety;Decreased awareness of deficits Awareness: Emergent Problem Solving: Slow processing;Decreased initiation;Difficulty sequencing;Requires verbal cues;Requires tactile cues General Comments:       General Comments General comments (skin integrity, edema, etc.): Spouse present throughout session    Exercises     Assessment/Plan    PT Assessment Patient needs continued PT services  PT Problem List Decreased strength;Decreased activity tolerance;Decreased balance;Decreased mobility;Decreased coordination;Decreased cognition;Decreased knowledge of use of DME;Decreased safety awareness       PT Treatment Interventions DME instruction;Gait training;Stair training;Functional mobility training;Therapeutic activities;Therapeutic exercise;Balance training;Neuromuscular re-education;Cognitive remediation    PT Goals (Current goals can be found in the Care Plan section)   Acute Rehab PT Goals Patient Stated Goal: to get back to normal PT Goal Formulation: With patient/family Time For Goal Achievement: 12/01/17 Potential to Achieve Goals: Good    Frequency Min 4X/week   Barriers to discharge        Co-evaluation PT/OT/SLP Co-Evaluation/Treatment: Yes Reason for Co-Treatment: Complexity of the patient's impairments (multi-system involvement);For patient/therapist safety;To address functional/ADL transfers PT goals addressed during session: Mobility/safety with mobility;Balance OT goals addressed during session: ADL's and self-care       AM-PAC PT "6 Clicks" Daily Activity  Outcome Measure Difficulty turning over in bed (including adjusting bedclothes, sheets and blankets)?: A Little Difficulty moving from lying on back to sitting on the side of the bed? : A Little Difficulty sitting down on and standing up from a chair with arms (e.g., wheelchair, bedside commode, etc,.)?: Unable Help needed moving to and from a bed to chair (including a wheelchair)?: A Lot Help needed walking in hospital room?: A Lot Help needed climbing 3-5 steps with a railing? : Total 6 Click Score: 12    End of Session Equipment Utilized During Treatment: Gait belt Activity Tolerance: Treatment limited secondary to medical complications (Comment)(BP of 182/95) Patient left: in chair;with call bell/phone within reach;with chair alarm set;with family/visitor present Nurse Communication: Mobility status PT Visit Diagnosis: Unsteadiness on feet (R26.81);Other abnormalities of gait and mobility (R26.89);Difficulty in walking, not elsewhere classified (R26.2)    Time: 1053-1130 PT Time Calculation (min) (ACUTE ONLY): 37 min   Charges:   PT Evaluation $PT Eval Moderate Complexity: 1 Mod          Lathyn Griggs Lawrence, SPT Acute Rehab [phone] (pager) [phone] (office)  Vern Claude  11/17/2017, 4:53 PM

## 2017-11-17 NOTE — Progress Notes (Signed)
[facility] PROGRESS NOTE  [name] [name] EVO:[id number] DOB: [birth year] DOA: 11/16/2017 PCP: [name], MD  Assessment/Plan:  1. Acute encephalopathy - etiology unclear but suspect worsening underlying disease related to ETOH in setting of no etoh for 4 days. Some improvement this am. MRI negative for acute infarct albeit not the best quality due to patient inability to be still.   Wife reports patient with chronic HA and takes opiods and drinks beer and liquor daily. She reports his baseline is poor short term memory, poor insight and decreased problem solving but not this bad. Speech therapy notes marked decline since 08/2016. No si/sx infection. No metabolic derangement.  He remains irritable but is alert and oriented to self and place. Moving all extremities, can make wants and need known. Concern for wife being able to continue care as he is aggressive. -will continue IV fluids -CIWA protocol -gentle IV fluids -monitor closely 2. HTN - BP high end of normal. Home meds include lasiz, cozaar and troprol xl. Will resume home meds and monitor 3. Dementia -see #1.  1. Resume home meds 4. H/o cryptogenic stroke - moving all extremities. Will continue home meds and request PT consult 5. H/o EtOH use - wife reports patient usually drinks 3-4 beers daily as well as 2-3 cocktails daily. See #1 1. CIWA   Code Status: full Family Communication: wife at bedside Disposition Plan: home when ready if safe   Consultants:    Procedures:    Antibiotics:    HPI/Subjective: He reports no pain this am.   77 yo hx stroke, dementia, s/p ILR replacement admitted with acute encephalopathy. No signs infection, no metabolic derangements. No indication of acute neuro event.   Objective: Vitals:   11/17/17 0800 11/17/17 0822  BP: (!) 187/89   Pulse: 86   Resp: 18   Temp: 98.8 F (37.1 C)   SpO2: 96% 96%    Intake/Output Summary (Last 24 hours) at 11/17/2017 1250 Last  data filed at 11/17/2017 0500 Gross per 24 hour  Intake 1000 ml  Output 800 ml  Net 200 ml   There were no vitals filed for this visit.  Exam:   General:  Obese alert very HOH irritable but cooperative  Cardiovascular: rrr no mgr 1+ LE edema  Respiratory: normal effort BS distant but clear I hear no wheeze or rhonchi  Abdomen: obese soft +BS no guarding or rebounding  Musculoskeletal: joints without swelling/erythema   Data Reviewed: Basic Metabolic Panel: Recent Labs  Lab 11/16/17 1702 11/17/17 0941  NA 134* 136  K 4.0 3.7  CL 99 102  CO2 24 23  GLUCOSE 111* 100*  BUN 13 12  CREATININE 0.96 1.02  CALCIUM 9.0 9.1   Liver Function Tests: Recent Labs  Lab 11/16/17 1702  AST 32  ALT 28  ALKPHOS 66  BILITOT 0.8  PROT 7.0  ALBUMIN 3.8   No results for input(s): LIPASE, AMYLASE in the last 168 hours. No results for input(s): AMMONIA in the last 168 hours. CBC: Recent Labs  Lab 11/16/17 1702 11/17/17 0941  WBC 9.4 8.8  NEUTROABS 7.0  --   HGB 13.8 14.5  HCT 43.0 44.9  MCV 98.9 98.0  PLT 234 264   Cardiac Enzymes: Recent Labs  Lab 11/16/17 1702  TROPONINI <0.03   BNP (last 3 results) Recent Labs    07/01/17 1127  BNP 49    ProBNP (last 3 results) No results for input(s): PROBNP in the last 8760 hours.  CBG: No results for input(s): GLUCAP in the last 168 hours.  No results found for this or any previous visit (from the past 240 hour(s)).   Studies: Dg Chest 2 View  Result Date: 11/16/2017 CLINICAL DATA:  77 year old male with altered mental status EXAM: CHEST - 2 VIEW COMPARISON:  Prior chest x-ray 02/15/2017 FINDINGS: Cardiomegaly. Stable widening of the mediastinum secondary to elongation of the transverse aorta. Implantable loop recorder visualized overlying the left chest. The lungs are clear. No pleural effusion or pneumothorax. No acute osseous abnormality. IMPRESSION: No active cardiopulmonary disease. Stable cardiomegaly.  Electronically Signed   By: Jacqulynn Cadet M.D.   On: 11/16/2017 18:29   Ct Head Wo Contrast  Result Date: 11/16/2017 CLINICAL DATA:  77 year old male with dementia, increasing confusion, agitation and aggression. EXAM: CT HEAD WITHOUT CONTRAST TECHNIQUE: Contiguous axial images were obtained from the base of the skull through the vertex without intravenous contrast. COMPARISON:  Prior head CT 10/22/2016 FINDINGS: Brain: No evidence of acute infarction, hemorrhage, hydrocephalus, extra-axial collection or mass lesion/mass effect. Similar pattern of cortical and central atrophy, ex vacuo dilatation of the lateral ventricles. Advanced periventricular, subcortical and deep white matter hypoattenuation most consistent with chronic microvascular ischemic white matter disease, remote lacunar infarct in the right corona radiata. Vascular: No hyperdense vessel or unexpected calcification. Skull: Normal. Negative for fracture or focal lesion. Sinuses/Orbits: No acute finding. Other: Left-sided hearing aid. IMPRESSION: 1. No acute intracranial abnormality. 2. Similar pattern of cortical and central atrophy, ex vacuo dilatation of the lateral ventricles and advanced chronic microvascular ischemic white matter disease. Electronically Signed   By: Jacqulynn Cadet M.D.   On: 11/16/2017 19:48   Mr Brain Wo Contrast  Result Date: 11/16/2017 CLINICAL DATA:  Confusion and agitation. EXAM: MRI HEAD WITHOUT CONTRAST TECHNIQUE: Multiplanar, multiecho pulse sequences of the brain and surrounding structures were obtained without intravenous contrast. COMPARISON:  Head CT 11/16/2017 FINDINGS: The study is degraded by motion. The examination had to be discontinued prior to completion due to patient altered mental status and inability to cooperate with the technologist's instructions. Axial and coronal diffusion-weighted imaging, sagittal T1-weighted imaging, axial FLAIR sequence and axial susceptibility weighted imaging were  obtained. Diffusion-weighted imaging shows no acute infarct. There is no midline shift or other significant mass effect. There is confluent periventricular white matter hyperintensity consistent with chronic small vessel ischemia. There is generalized volume loss. IMPRESSION: Truncated and motion degraded examination.  No acute ischemia. Electronically Signed   By: Ulyses Jarred M.D.   On: 11/16/2017 22:53    Scheduled Meds: .  stroke: mapping our early stages of recovery book   Does not apply Once  . apixaban  5 mg Oral BID  . arformoterol  15 mcg Nebulization BID  . ARIPiprazole  5 mg Oral Daily  . atorvastatin  40 mg Oral Daily  . donepezil  10 mg Oral QHS  . folic acid  1 mg Oral Daily  . gabapentin  300 mg Oral BID  . multivitamin with minerals  1 tablet Oral Daily  . pantoprazole  40 mg Oral Daily  . rOPINIRole  1 mg Oral QHS  . thiamine  100 mg Oral Daily   Or  . thiamine  100 mg Intravenous Daily  . umeclidinium bromide  1 puff Inhalation Daily  . venlafaxine XR  150 mg Oral BID   Continuous Infusions: . sodium chloride      Principal Problem:   Acute encephalopathy Active Problems:   Cognitive impairment  MS (multiple sclerosis) ([place])   History of stroke   Essential hypertension    Time spent: 37 minutes    [place] NP  [facility]  If 7PM-7AM, please contact night-coverage at [email or web address], password [facility] 11/17/2017, 12:50 PM  LOS: 0 days

## 2017-11-17 NOTE — Evaluation (Signed)
Speech Language Pathology Evaluation Patient Details Name: [name] [name] MRN: [id number] DOB: 09-18-1940 Today's Date: 11/17/2017 Time: 9562-1308 SLP Time Calculation (min) (ACUTE ONLY): 45 min  Problem List:  Patient Active Problem List   Diagnosis Date Noted  . Acute encephalopathy 11/16/2017  . Obesity (BMI 30-39.9) 06/03/2017  . History of embolic stroke 65/78/4696  . OSA (obstructive sleep apnea) 12/01/2016  . Morning headache 12/01/2016  . 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 Cryptogenic stroke ([place]) 06/21/2016  . Expressive aphasia   . Essential hypertension 05/17/2016  . Family history of stroke 04/06/2016  . Cognitive impairment 04/06/2016  . Syncope and collapse   . Dysarthria   . Acute cystitis without hematuria   . History of stroke   . Slurred speech 06/20/2015  . AKI (acute kidney injury) ([place]) 06/20/2015  . Thoracic aortic aneurysm ([place])   . Hyperlipidemia LDL goal <70 06/05/2015  . Gastroesophageal reflux disease without esophagitis 06/05/2015  . COPD  GOLD III 09/04/2013  . Lung nodule 09/04/2013  . Cancer ([place])   . Coronary artery disease involving native coronary artery of native heart without angina pectoris 02/01/2013  . Chronic diastolic CHF (congestive heart failure) ([place]) 01/03/2013  . Depression   . Urinary retention with incomplete bladder emptying   . BPH (benign prostatic hyperplasia)   . Brugada syndrome 08/23/2011  . Hypertensive heart disease with CHF (congestive heart failure) ([place]) 04/08/2009  . Mitral valve disorder 04/08/2009  . CAROTID BRUIT, RIGHT 03/06/2009  . SHORTNESS OF BREATH 03/06/2009  . OTHER CHEST PAIN 03/06/2009  . MS (multiple sclerosis) ([place]) 10/11/2006   Past Medical History:  Past Medical History:  Diagnosis Date  . Arthritis    s/p TKR  . Bladder cancer (HCC)    [facility], [facility] low grade papillary urothileal carcinoma  . BPH (benign prostatic hyperplasia)   . Brugada syndrome    Possible Type II Brugada ECG pattern. No  family history of SCD, no syncope, no tachypalpitations.  Marland Kitchen CAD (coronary artery disease)    a. LHC 1/15 - mid LCx 50 and 60%, proximal RCA 50%  . Chronic diastolic CHF (congestive heart failure) ([place])   . CKD (chronic kidney disease)   . COPD (chronic obstructive pulmonary disease) ([place])    Prior heavy smoker. PFTs (3/11): FVC 87%, FEV1 73%, ratio 0.57, DLCO 75%, TLC 121%. Moderate obstructive defect. PFTs (9/15): Only minimal obstruction, sugPrior heavy smoker. PFTs (3/11): FVC 87%, FEV1 73%, ratio 0.57, DLCO 75%, TLC 121%. Moderate obstructive defect. PFTs (9/15) minimal obstruction, poss asthma component   . Depression    with bipolar tendencies  . DOE (dyspnea on exertion)    a. Myoview 8/09- EF 57%, no ischemia // b. Myoview (3/11) EF 68%, diaphragmatic attenuation, no ischemia //  c Echo (4/11) EF 29-52%, mild diastolic dysfunction, PASP 38 mmHg // d. PFTs 9/15 minimal obstruction  /  e. CT negative for ILD  . GERD (gastroesophageal reflux disease)   . History of Doppler ultrasound    Carotid US 3/11 negative for significant stenosis  //  carotid US 6/17: Mild bilateral plaque, 1-39% ICA  . History of echocardiogram    a. Echo 12/14 Inferior and distal septal HK, mild LVH, EF 50-55%, mild MR, mild LAE  //  b. Echo 6/17: Mild focal basal septal hypertrophy, EF 60-65%, normal wall motion, grade 1 diastolic dysfunction, mild AI  . HLD (hyperlipidemia)   . Hypertensive heart disease with CHF (congestive heart failure) ([place]) 04/08/2009  . Low testosterone   .  Lung nodule    a. CT in 2015 >> PET in 12/15 not sugg of malignancy // CT 6/19: continue to look benign  . LVH (left ventricular hypertrophy)    a. Echo 12/14 Inferior and distal septal HK, mild LVH, EF 50-55%, mild MR, mild LAE  . Mild dementia ([place])   . Mitral regurgitation    Echo (4/11) with PISA ERO 0.3 cm^2 and regurgitant volume 41 mL (moderate MR). Echo (12/14) with mild MR.   . MS (multiple sclerosis) ([place])   . MS  (multiple sclerosis) ([place])   . OSA (obstructive sleep apnea)   . Right bundle branch block   . Stroke ([place])   . Thoracic aortic aneurysm (HCC)    4.1 cm 2017 // Chest CTA 6/19: stable ascending thoracic aortic aneurysm 4.1 cm, aortic atherosclerosis, stable RML nodular densities (appears benign), emphysema >> FU 1 year  . Urinary retention with incomplete bladder emptying    receives botox injections and treatment for BPH through [facility]   Past Surgical History:  Past Surgical History:  Procedure Laterality Date  . APPENDECTOMY    . EP IMPLANTABLE DEVICE N/A 06/23/2015   Procedure: Loop Recorder Insertion;  Surgeon: Deboraha Sprang, MD;  Location: [facility] CV LAB;  Service: Cardiovascular;  Laterality: N/A;  . HEMORRHOID SURGERY    . LEFT HEART CATHETERIZATION WITH CORONARY ANGIOGRAM N/A 01/05/2013   Procedure: LEFT HEART CATHETERIZATION WITH CORONARY ANGIOGRAM;  Surgeon: Larey Dresser, MD;  Location: [facility] CATH LAB;  Service: Cardiovascular;  Laterality: N/A;  . ROTATOR CUFF REPAIR    . TEE WITHOUT CARDIOVERSION N/A 06/23/2015   Procedure: TRANSESOPHAGEAL ECHOCARDIOGRAM (TEE);  Surgeon: Fay Records, MD;  Location: [facility] ENDOSCOPY;  Service: Cardiovascular;  Laterality: N/A;  . TONSILLECTOMY    . TOTAL KNEE ARTHROPLASTY Right   . TRANSURETHRAL RESECTION OF PROSTATE     HPI:  77 y.o. male with c/o confusion, agitation, and aggression.Marland Kitchen PMH: dementia, cryptogenic stroke s/p ILR placement and on eliquis, Brugada pattern on ECG, COPD, MS, non occlusive CAD.   Assessment / Plan / Recommendation Clinical Impression  The Montreal Cognitive Assessment (MoCA) was administered. Pt scored 9/30 (n=26+/30), with points lost on all subtests except animal naming. Clock drawing exercise was also significantly impaired. Pt/wife are well known to this SLP, having seen him for outpatient treatment last July and August (2018). MoCA score at that time was 18/30, which is significantly higher than today's score of  9/30.  In addition, the pt's wife verbalizes pt's tendency toward aggression and outbursts of rage, which significantly increased over this last week prior to admit. This raises concern for the safety of both pt and spouse going forward. 24 hour supervision is recommended at DC.     SLP Assessment  SLP Recommendation/Assessment: All further Speech Language Pathology needs can be addressed in the next venue of care, for education and to establish routines for safety.   SLP Visit Diagnosis: Cognitive communication deficit (R41.841)    Follow Up Recommendations  Skilled Nursing facility;24 hour supervision/assistance       SLP Evaluation Cognition  Overall Cognitive Status: History of cognitive impairments - at baseline Arousal/Alertness: Awake/alert Orientation Level: Oriented to person;Oriented to place;Disoriented to time;Disoriented to situation Attention: Focused;Sustained Focused Attention: Impaired Focused Attention Impairment: Verbal basic;Functional basic Sustained Attention: Impaired Sustained Attention Impairment: Verbal basic;Functional basic Memory: Impaired Memory Impairment: Storage deficit;Retrieval deficit;Decreased recall of new information;Decreased short term memory Decreased Short Term Memory: Verbal basic Awareness: Impaired Awareness Impairment: Intellectual impairment;Emergent  impairment;Anticipatory impairment Problem Solving: Impaired Problem Solving Impairment: Verbal basic;Functional basic Executive Function: Reasoning;Organizing;Sequencing Reasoning: Impaired Reasoning Impairment: Verbal basic;Functional basic Sequencing: Impaired Sequencing Impairment: Verbal basic;Functional basic Organizing: Impaired Organizing Impairment: Verbal basic;Functional basic Safety/Judgment: Impaired       Comprehension  Auditory Comprehension Overall Auditory Comprehension: Appears within functional limits for tasks assessed Interfering Components: Hearing     Expression Expression Primary Mode of Expression: Verbal Verbal Expression Overall Verbal Expression: Appears within functional limits for tasks assessed Written Expression Dominant Hand: Right   Oral / Motor  Oral Motor/Sensory Function Overall Oral Motor/Sensory Function: Within functional limits Motor Speech Overall Motor Speech: Appears within functional limits for tasks assessed   GO                   Jaquayla Hege B. [facility], CCC-SLP Speech Language Pathologist [phone]  Shonna Chock 11/17/2017, 10:50 AM

## 2017-11-17 NOTE — Progress Notes (Signed)
PT Progress Note for Charges    11/17/17 1600  PT General Charges  $$ ACUTE PT VISIT 1 Visit  PT Evaluation  $PT Eval Moderate Complexity 1 Mod  [name], [place], DPT  Acute Rehabilitation Services Pager [phone] Office [phone]

## 2017-11-18 DIAGNOSIS — I10 Essential (primary) hypertension: Secondary | ICD-10-CM

## 2017-11-18 DIAGNOSIS — Z8673 Personal history of transient ischemic attack (TIA), and cerebral infarction without residual deficits: Secondary | ICD-10-CM

## 2017-11-18 DIAGNOSIS — G9340 Encephalopathy, unspecified: Secondary | ICD-10-CM

## 2017-11-18 DIAGNOSIS — G35 Multiple sclerosis: Secondary | ICD-10-CM

## 2017-11-18 DIAGNOSIS — R4189 Other symptoms and signs involving cognitive functions and awareness: Secondary | ICD-10-CM

## 2017-11-18 DIAGNOSIS — R4182 Altered mental status, unspecified: Secondary | ICD-10-CM

## 2017-11-18 LAB — BASIC METABOLIC PANEL
ANION GAP: 9 (ref 5–15)
BUN: 13 mg/dL (ref 8–23)
CO2: 23 mmol/L (ref 22–32)
Calcium: 8.7 mg/dL — ABNORMAL LOW (ref 8.9–10.3)
Chloride: 105 mmol/L (ref 98–111)
Creatinine, Ser: 1.23 mg/dL (ref 0.61–1.24)
GFR calc Af Amer: >60 mL/min (ref >60)
GFR, EST NON AFRICAN AMERICAN: 55 mL/min — AB (ref >60)
GLUCOSE: 101 mg/dL — AB (ref 70–99)
POTASSIUM: 3.9 mmol/L (ref 3.5–5.1)
Sodium: 137 mmol/L (ref 135–145)

## 2017-11-18 LAB — CBC
HEMATOCRIT: 42.9 % (ref 39.0–52.0)
Hemoglobin: 13.8 g/dL (ref 13.0–17.0)
MCH: 31.7 pg (ref 26.0–34.0)
MCHC: 32.2 g/dL (ref 30.0–36.0)
MCV: 98.6 fL (ref 80.0–100.0)
NRBC: 0 % (ref 0.0–0.2)
PLATELETS: 247 10*3/uL (ref 150–400)
RBC: 4.35 MIL/uL (ref 4.22–5.81)
RDW: 14 % (ref 11.5–15.5)
WBC: 8.7 10*3/uL (ref 4.0–10.5)

## 2017-11-18 MED ORDER — QUETIAPINE FUMARATE 25 MG PO TABS: 25.0000 mg | ORAL_TABLET | Freq: Every day | ORAL | 0 refills | Status: DC

## 2017-11-18 MED ORDER — SENNOSIDES-DOCUSATE SODIUM 8.6-50 MG PO TABS: 1.0000 | ORAL_TABLET | Freq: Every evening | ORAL | Status: AC | PRN

## 2017-11-18 MED ORDER — ADULT MULTIVITAMIN W/MINERALS CH: 1.0000 | ORAL_TABLET | Freq: Every day | ORAL | Status: DC

## 2017-11-18 MED ORDER — ACETAMINOPHEN 325 MG PO TABS: 650.0000 mg | ORAL_TABLET | ORAL | Status: DC | PRN

## 2017-11-18 NOTE — Care Management Note (Signed)
Case Management Note  Patient Details  Name: VALDIS BEVILL MRN: [id number] Date of Birth: 03-25-1940  Subjective/Objective:                    Action/Plan: Pt discharging home with orders for [facility] services. CM provided choice and they have used [facility] in the past and would like to use them again. Butch Penny with [facility] made aware and accepted the referral. Wife to provide transport home.   Expected Discharge Date:  11/18/17               Expected Discharge Plan:  [place]  In-House Referral:     Discharge planning Services  CM Consult  Post Acute Care Choice:  Home Health Choice offered to:  Patient, Spouse  DME Arranged:    DME Agency:     HH Arranged:  RN, PT, OT, Nurse's Aide, Social Work [employer] Agency:  [place]  Status of Service:  Completed, signed off  If discussed at [employer], dates discussed:    Additional Comments:  DAMIN SALIDO, RN 11/18/2017, 10:10 AM

## 2017-11-18 NOTE — Discharge Instructions (Signed)
You were cared for by a hospitalist during your hospital stay. If you have any questions about your discharge medications or the care you received while you were in the hospital after you are discharged, you can call the unit and asked to speak with the hospitalist on call if the hospitalist that took care of you is not available. Once you are discharged, your primary care physician will handle any further medical issues.   Please note that NO REFILLS for any discharge medications will be authorized once you are discharged, as it is imperative that you return to your primary care physician (or establish a relationship with a primary care physician if you do not have one) for your aftercare needs so that they can reassess your need for medications and monitor your lab values.  Please take all your medications with you for your next visit with your Primary MD. Please ask your Primary MD to get all Hospital records sent to his/her office. Please request your Primary MD to go over all hospital test results at the follow up.   If you experience worsening of your admission symptoms, develop shortness of breath, chest pain, suicidal or homicidal thoughts or a life threatening emergency, you must seek medical attention immediately by calling 911 or calling your MD.   Dennis Bast must read the complete instructions/literature along with all the possible adverse reactions/side effects for all the medicines you take including new medications that have been prescribed to you. Take new medicines after you have completely understood and accpet all the possible adverse reactions/side effects.    Do not drive when taking pain medications or sedatives.     Do not take more than prescribed Pain, Sleep and Anxiety Medications   If you have smoked or chewed Tobacco in the last 2 yrs please stop. Stop any regular alcohol  and or recreational drug use.   Wear Seat belts while driving.  Information on my medicine - ELIQUIS  (apixaban)  This medication education was reviewed with me or my healthcare representative as part of my discharge preparation. Why was Eliquis prescribed for you? Eliquis was prescribed for you to reduce the risk of forming blood clots that can cause a stroke if you have a medical condition called atrial fibrillation (a type of irregular heartbeat) OR to reduce the risk of a blood clots forming after orthopedic surgery.  What do You need to know about Eliquis ? Take your Eliquis TWICE DAILY - one tablet in the morning and one tablet in the evening with or without food.  It would be best to take the doses about the same time each day.  If you have difficulty swallowing the tablet whole please discuss with your pharmacist how to take the medication safely.  Take Eliquis exactly as prescribed by your doctor and DO NOT stop taking Eliquis without talking to the doctor who prescribed the medication.  Stopping may increase your risk of developing a new clot or stroke.  Refill your prescription before you run out.  After discharge, you should have regular check-up appointments with your healthcare provider that is prescribing your Eliquis.  In the future your dose may need to be changed if your kidney function or weight changes by a significant amount or as you get older.  What do you do if you miss a dose? If you miss a dose, take it as soon as you remember on the same day and resume taking twice daily.  Do not take more than one  dose of ELIQUIS at the same time.  Important Safety Information A possible side effect of Eliquis is bleeding. You should call your healthcare provider right away if you experience any of the following: ? Bleeding from an injury or your nose that does not stop. ? Unusual colored urine (red or dark brown) or unusual colored stools (red or black). ? Unusual bruising for unknown reasons. ? A serious fall or if you hit your head (even if there is no bleeding).  Some  medicines may interact with Eliquis and might increase your risk of bleeding or clotting while on Eliquis. To help avoid this, consult your healthcare provider or pharmacist prior to using any new prescription or non-prescription medications, including herbals, vitamins, non-steroidal anti-inflammatory drugs (NSAIDs) and supplements.  This website has more information on Eliquis (apixaban): [email or web address].

## 2017-11-18 NOTE — Progress Notes (Signed)
Physical Therapy Treatment Patient Details Name: [name] [name] MRN: [id number] DOB: Jan 20, 1940 Today's Date: 11/18/2017    History of Present Illness Pt is 77 y.o males with a PMH consisting of dementia, stroke, MS, CAD, COPD, and Brugada syndrome reports to the ED with complaints of confusion and agitation since 11/14/2017. Pt also demonstrates R sided weakness and facial droop. MRI on 11/17/2017 was negative, but with considerable pt movement during the exam.     PT Comments    Pt remains limited secondary to generalized weakness and very poor safety awareness. He requires very close min guard to min A for functional transfers and balance. Pt's spouse present throughout session and adamant that pt will return home. Based on pt's current presentation and impairments, he appears to be at a highly increased risk for falls. PT continuing to recommend pt have further intensive therapy services at a SNF prior to returning home with family support. Pt would continue to benefit from skilled physical therapy services at this time while admitted and after d/c to address the below listed limitations in order to improve overall safety and independence with functional mobility.    Follow Up Recommendations  SNF;Other (comment)(pt and family adamant about returning home despite recs) - if pt d/c's home will need 24/7 supervision/assistance and HHPT     Equipment Recommendations  None recommended by PT    Recommendations for Other Services       Precautions / Restrictions Precautions Precautions: Fall Precaution Comments: Impulsive, decreased safety awareness Restrictions Weight Bearing Restrictions: No    Mobility  Bed Mobility               General bed mobility comments: pt sitting EOB upon arriva  Transfers Overall transfer level: Needs assistance Equipment used: None Transfers: Sit to/from Stand Sit to Stand: Min assist;+2 safety/equipment         General transfer  comment: pt requiring min A initially for safety and stability with transition into standing from EOB, with practice progressing to min guard  Ambulation/Gait             General Gait Details: focus of session was on standing balance and functional transfers   Stairs             Wheelchair Mobility    Modified Rankin (Stroke Patients Only)       Balance Overall balance assessment: Needs assistance Sitting-balance support: Feet supported Sitting balance-Leahy Scale: Fair     Standing balance support: No upper extremity supported Standing balance-Leahy Scale: Fair Standing balance comment: close min guard for safety                            Cognition Arousal/Alertness: Awake/alert Behavior During Therapy: Impulsive Overall Cognitive Status: Impaired/Different from baseline Area of Impairment: Memory;Following commands;Safety/judgement;Problem solving                     Memory: Decreased short-term memory;Decreased recall of precautions Following Commands: Follows one step commands consistently;Follows multi-step commands inconsistently;Follows multi-step commands with increased time Safety/Judgement: Decreased awareness of safety;Decreased awareness of deficits   Problem Solving: Slow processing;Difficulty sequencing;Requires verbal cues;Requires tactile cues        Exercises General Exercises - Lower Extremity Hip Flexion/Marching: Standing;AROM;Both;20 reps;Other (comment)(progressing from min A to close min guard) Other Exercises Other Exercises: pt performed sit<>stand x8 from EOB, progressing from needing min A to close min guard for safety without an AD Other Exercises:  pt participated in static and dynamic standing balance activities with lateral weight shifting and A-P weight shifting in standing without any UE supports, min A to close min guard required    General Comments        Pertinent Vitals/Pain Pain Assessment:  No/denies pain    Home Living                      Prior Function            PT Goals (current goals can now be found in the care plan section) Acute Rehab PT Goals PT Goal Formulation: With patient/family Time For Goal Achievement: 12/01/17 Potential to Achieve Goals: Good Progress towards PT goals: Progressing toward goals    Frequency    Min 3X/week      PT Plan Discharge plan needs to be updated;Frequency needs to be updated    Co-evaluation              AM-PAC PT "6 Clicks" Daily Activity  Outcome Measure  Difficulty turning over in bed (including adjusting bedclothes, sheets and blankets)?: A Little Difficulty moving from lying on back to sitting on the side of the bed? : A Little Difficulty sitting down on and standing up from a chair with arms (e.g., wheelchair, bedside commode, etc,.)?: Unable Help needed moving to and from a bed to chair (including a wheelchair)?: A Little Help needed walking in hospital room?: A Lot Help needed climbing 3-5 steps with a railing? : A Lot 6 Click Score: 14    End of Session Equipment Utilized During Treatment: Gait belt Activity Tolerance: Patient tolerated treatment well Patient left: in bed;with call bell/phone within reach;with bed alarm set;with family/visitor present Nurse Communication: Mobility status PT Visit Diagnosis: Unsteadiness on feet (R26.81);Other abnormalities of gait and mobility (R26.89);Difficulty in walking, not elsewhere classified (R26.2)     Time: 4097-3532 PT Time Calculation (min) (ACUTE ONLY): 20 min  Charges:  $Therapeutic Activity: 8-22 mins                     [name], [place], DPT  Acute Rehabilitation Services Pager [phone] Office [place] 11/18/2017, 10:20 AM

## 2017-11-18 NOTE — Progress Notes (Signed)
Pt discharge education and instructions completed with pt and spouse at bedside; both voices understanding and denies any questions. Pt IV and telemetry removed; pt discharge home with spouse to transport him home. Pt to pick up electronically sent prescriptions from preferred pharmacy on file. Pt transported off unit via wheelchair with spouse and belongings to the side. Delia Heady RN

## 2017-11-18 NOTE — Discharge Summary (Signed)
Physician Discharge Summary  [name] [name] KVQ:[id number] DOB: 1940-10-26 DOA: 11/16/2017  PCP: [name], MD  Admit date: 11/16/2017 Discharge date: 11/18/2017  Admitted From: home  Disposition:  home    Home Health:  ordered     Discharge Condition:  stable   CODE STATUS:  DNR   Consultations:  none    Discharge Diagnoses:  Principal Problem:   Acute encephalopathy Active Problems:   MS (multiple sclerosis) ([place])   History of stroke   Cognitive impairment   Essential hypertension       Brief Summary: [name] [name]  is a 77 y.o.malewith medical history significant ofdementia, multiple ischemic CVAs  s/p ILR placement and on eliquis, Brugada pattern on ECG, COPD, progressive MS, non occlusive CAD.  Patient sent in to ED by wife with c/o confusion and agitation. Per wife patient has been increasingly confused, with agitation and aggression at times since Monday. No PO meds since Monday.   Hospital Course:  Acute encephalopathy - initially thought to be Wernicke's encephalopathy due to daily drinking however, no major improvement noted with Thiamine via IV but he is taking his medications appropriately - no underlying infections or abnormal blood work  - CT head and MRI noted below which are unrevealing - I have reviewed the notes from his PCP and I feel that his dementia is progressing-  - PT recommended CIR but his wife is wanting to take him home with home health to continue taking care of him  - she does state that he is getting agitated at night and also that Abilify has never helped - thus I have d/c'd the Abilify (per psych, this does need to be tapered) and started Seroquel at bedtime- she can start with 25 and give an extra dose of 25 if it is not helping but not go higher than this dose. Advised for her to take him to his PCP in about 3-4 days for a follow up  All other medical problems have remained stable.    Discharge  Exam: Vitals:   11/18/17 0831 11/18/17 0833  BP:    Pulse:    Resp:    Temp:    SpO2: 95% 95%   Vitals:   11/18/17 0438 11/18/17 0753 11/18/17 0831 11/18/17 0833  BP: (!) 165/75 (!) 164/98    Pulse: 85 82    Resp: 20 20    Temp: 98 F (36.7 C) 98.2 F (36.8 C)    TempSrc: Oral Oral    SpO2: 97% 95% 95% 95%    General: Pt is alert, awake, not in acute distress Cardiovascular: RRR, S1/S2 +, no rubs, no gallops Respiratory: CTA bilaterally, no wheezing, no rhonchi Abdominal: Soft, NT, ND, bowel sounds + Extremities: no edema, no cyanosis   Discharge Instructions  Discharge Instructions    Diet - low sodium heart healthy   Complete by:  As directed    Increase activity slowly   Complete by:  As directed      Allergies as of 11/18/2017      Reactions   Alcohol-sulfur [sulfur] Other (See Comments)   Patient was suspected of having "Brugada Syndrome"   Bupivacaine Other (See Comments)   Patient was suspected of having "Brugada Syndrome"   Clomipramine Hcl Other (See Comments)   Patient was suspected of having "Brugada Syndrome"   Flecainide Other (See Comments)   Patient was suspected of having "Brugada Syndrome"   Lithium Other (See Comments)   Patient was suspected of  having "Brugada Syndrome"   Acetylcholine    Patient was suspected of having "Brugada Syndrome": (BrS) is a genetic condition that results in abnormal electrical activity within the heart, increasing the risk of sudden cardiac death. Those affected may have episodes of passing out.   Amitriptyline Other (See Comments)   Patient was suspected of having "Brugada Syndrome"   Cocaine Other (See Comments)   Patient was suspected of having "Brugada Syndrome"   Desipramine Other (See Comments)   Patient was suspected of having "Brugada Syndrome"   Ergonovine Other (See Comments)   Patient was suspected of having "Brugada Syndrome"   Loxapine Other (See Comments)   Patient was suspected of having "Brugada  Syndrome"   Nortriptyline Other (See Comments)   Patient was suspected of having "Brugada Syndrome"   Oxcarbazepine Other (See Comments)   Patient was suspected of having "Brugada Syndrome"   Procainamide Other (See Comments)   Patient was suspected of having "Brugada Syndrome"   Procaine Other (See Comments)   Patient was suspected of having "Brugada Syndrome"   Propafenone Other (See Comments)   Patient was suspected of having "Brugada Syndrome"   Propofol Other (See Comments)   Patient was suspected of having "Brugada Syndrome"   Trifluoperazine Other (See Comments)   Patient was suspected of having "Brugada Syndrome"      Medication List    STOP taking these medications   ARIPiprazole 5 MG tablet Commonly known as:  ABILIFY     TAKE these medications   acetaminophen 325 MG tablet Commonly known as:  TYLENOL Take 2 tablets (650 mg total) by mouth every 4 (four) hours as needed for mild pain (or temp > 37.5 C (99.5 F)).   atorvastatin 40 MG tablet Commonly known as:  LIPITOR TAKE 1 TABLET DAILY   B-12 1000 MCG Caps Take 1,000 mcg by mouth daily.   Biotin 1000 MCG tablet Take 1,000 mcg by mouth 3 (three) times daily.   cholecalciferol 10 MCG (400 UNIT) Tabs tablet Commonly known as:  VITAMIN D3 Take 400 Units by mouth daily.   donepezil 10 MG tablet Commonly known as:  ARICEPT Take 1 tablet (10 mg total) by mouth at bedtime.   ELIQUIS 5 MG Tabs tablet Generic drug:  apixaban TAKE 1 TABLET TWICE A DAY   folic acid 1 MG tablet Commonly known as:  FOLVITE Take 1 mg by mouth daily.   furosemide 40 MG tablet Commonly known as:  LASIX TAKE 1 TABLET(40 MG) BY MOUTH DAILY What changed:    how much to take  how to take this  when to take this  additional instructions   gabapentin 300 MG capsule Commonly known as:  NEURONTIN Take 300 mg by mouth 2 (two) times daily. Patient taking 300mg  twice daily   HYDROcodone-acetaminophen 10-325 MG tablet Commonly  known as:  NORCO Take 1 tablet by mouth every 6 (six) hours as needed for moderate pain or severe pain (DX: G89.4, (G35 - MS)).   losartan 100 MG tablet Commonly known as:  COZAAR Take 1 tablet (100 mg total) by mouth daily.   MAGNESIUM CITRATE PO Take 0.5-1 Bottles by mouth once as needed (for mild constipation).   metoprolol succinate 100 MG 24 hr tablet Commonly known as:  TOPROL-XL Take 1 1/2 tablets (150 mg total) by mouth daily.   multivitamin with minerals Tabs tablet Take 1 tablet by mouth daily.   pantoprazole 40 MG tablet Commonly known as:  PROTONIX TAKE 1 TABLET DAILY  PROAIR HFA 108 (90 Base) MCG/ACT inhaler Generic drug:  albuterol USE 1 TO 2 INHALATIONS EVERY 6 HOURS AS NEEDED FOR WHEEZING OR SHORTNESS OF BREATH What changed:  See the new instructions.   prochlorperazine 25 MG suppository Commonly known as:  COMPAZINE Place 25 mg rectally every 12 (twelve) hours as needed for nausea or vomiting.   QUEtiapine 25 MG tablet Commonly known as:  SEROQUEL Take 1-2 tablets (25-50 mg total) by mouth at bedtime.   rOPINIRole 1 MG tablet Commonly known as:  REQUIP TAKE 1 TABLET AT BEDTIME   senna-docusate 8.6-50 MG tablet Commonly known as:  Senokot-S Take 1 tablet by mouth at bedtime as needed for mild constipation.   Tiotropium Bromide-Olodaterol 2.5-2.5 MCG/ACT Aers Inhale 2 puffs into the lungs daily.   venlafaxine XR 150 MG 24 hr capsule Commonly known as:  EFFEXOR-XR Take 1 capsule (150 mg total) by mouth 2 (two) times daily.      Follow-up Information    [name], MD. Schedule an appointment as soon as possible for a visit.   Specialty:  Family Medicine Why:  by Monday  Contact information: [place] [address] [place] [address] [phone]        Nelva Bush, MD .   Specialty:  Cardiology Contact information: [address] [place] [address] [phone]          Allergies  Allergen Reactions  .  Alcohol-Sulfur [Sulfur] Other (See Comments)    Patient was suspected of having "Brugada Syndrome"  . Bupivacaine Other (See Comments)    Patient was suspected of having "Brugada Syndrome"  . Clomipramine Hcl Other (See Comments)    Patient was suspected of having "Brugada Syndrome"  . Flecainide Other (See Comments)    Patient was suspected of having "Brugada Syndrome"  . Lithium Other (See Comments)    Patient was suspected of having "Brugada Syndrome"  . Acetylcholine     Patient was suspected of having "Brugada Syndrome": (BrS) is a genetic condition that results in abnormal electrical activity within the heart, increasing the risk of sudden cardiac death. Those affected may have episodes of passing out.  . Amitriptyline Other (See Comments)    Patient was suspected of having "Brugada Syndrome"  . Cocaine Other (See Comments)    Patient was suspected of having "Brugada Syndrome"  . Desipramine Other (See Comments)    Patient was suspected of having "Brugada Syndrome"  . Ergonovine Other (See Comments)    Patient was suspected of having "Brugada Syndrome"  . Loxapine Other (See Comments)    Patient was suspected of having "Brugada Syndrome"  . Nortriptyline Other (See Comments)    Patient was suspected of having "Brugada Syndrome"  . Oxcarbazepine Other (See Comments)    Patient was suspected of having "Brugada Syndrome"  . Procainamide Other (See Comments)    Patient was suspected of having "Brugada Syndrome"  . Procaine Other (See Comments)    Patient was suspected of having "Brugada Syndrome"  . Propafenone Other (See Comments)    Patient was suspected of having "Brugada Syndrome"  . Propofol Other (See Comments)    Patient was suspected of having "Brugada Syndrome"  . Trifluoperazine Other (See Comments)    Patient was suspected of having "Brugada Syndrome"     Procedures/Studies:    Dg Chest 2 View  Result Date: 11/16/2017 CLINICAL DATA:  77 year old male with  altered mental status EXAM: CHEST - 2 VIEW COMPARISON:  Prior chest  x-ray 02/15/2017 FINDINGS: Cardiomegaly. Stable widening of the mediastinum secondary to elongation of the transverse aorta. Implantable loop recorder visualized overlying the left chest. The lungs are clear. No pleural effusion or pneumothorax. No acute osseous abnormality. IMPRESSION: No active cardiopulmonary disease. Stable cardiomegaly. Electronically Signed   By: Jacqulynn Cadet M.D.   On: 11/16/2017 18:29   Ct Head Wo Contrast  Result Date: 11/16/2017 CLINICAL DATA:  77 year old male with dementia, increasing confusion, agitation and aggression. EXAM: CT HEAD WITHOUT CONTRAST TECHNIQUE: Contiguous axial images were obtained from the base of the skull through the vertex without intravenous contrast. COMPARISON:  Prior head CT 10/22/2016 FINDINGS: Brain: No evidence of acute infarction, hemorrhage, hydrocephalus, extra-axial collection or mass lesion/mass effect. Similar pattern of cortical and central atrophy, ex vacuo dilatation of the lateral ventricles. Advanced periventricular, subcortical and deep white matter hypoattenuation most consistent with chronic microvascular ischemic white matter disease, remote lacunar infarct in the right corona radiata. Vascular: No hyperdense vessel or unexpected calcification. Skull: Normal. Negative for fracture or focal lesion. Sinuses/Orbits: No acute finding. Other: Left-sided hearing aid. IMPRESSION: 1. No acute intracranial abnormality. 2. Similar pattern of cortical and central atrophy, ex vacuo dilatation of the lateral ventricles and advanced chronic microvascular ischemic white matter disease. Electronically Signed   By: Jacqulynn Cadet M.D.   On: 11/16/2017 19:48   Mr Jodene Nam Head Wo Contrast  Result Date: 11/17/2017 CLINICAL DATA:  76 y/o  M; confusion and agitation.  Encephalopathy. EXAM: MRA HEAD WITHOUT CONTRAST TECHNIQUE: Angiographic images of the Circle of Willis were obtained  using MRA technique without intravenous contrast. COMPARISON:  11/16/2017 MRI of the head. 06/21/2016 MRA of the head. FINDINGS: Internal carotid arteries:  Patent. Anterior cerebral arteries:  Patent. Middle cerebral arteries: Patent. Anterior communicating artery: Not identified, likely hypoplastic or absent. Posterior communicating arteries: Not identified, likely hypoplastic or absent. Posterior cerebral arteries:  Patent. Basilar artery:  Patent. Vertebral arteries:  Patent. Motion artifact, suboptimal assessment for subtle stenosis or small 1-2 mm aneurysm. No large vessel occlusion, high-grade stenosis, or aneurysm identified. IMPRESSION: Motion degraded study. Patent anterior and posterior intracranial circulation. No large vessel occlusion or high-grade stenosis identified. Electronically Signed   By: Kristine Garbe M.D.   On: 11/17/2017 19:13   Mr Brain Wo Contrast  Result Date: 11/16/2017 CLINICAL DATA:  Confusion and agitation. EXAM: MRI HEAD WITHOUT CONTRAST TECHNIQUE: Multiplanar, multiecho pulse sequences of the brain and surrounding structures were obtained without intravenous contrast. COMPARISON:  Head CT 11/16/2017 FINDINGS: The study is degraded by motion. The examination had to be discontinued prior to completion due to patient altered mental status and inability to cooperate with the technologist's instructions. Axial and coronal diffusion-weighted imaging, sagittal T1-weighted imaging, axial FLAIR sequence and axial susceptibility weighted imaging were obtained. Diffusion-weighted imaging shows no acute infarct. There is no midline shift or other significant mass effect. There is confluent periventricular white matter hyperintensity consistent with chronic small vessel ischemia. There is generalized volume loss. IMPRESSION: Truncated and motion degraded examination.  No acute ischemia. Electronically Signed   By: Ulyses Jarred M.D.   On: 11/16/2017 22:53      The results of  significant diagnostics from this hospitalization (including imaging, microbiology, ancillary and laboratory) are listed below for reference.     Microbiology: Recent Results (from the past 240 hour(s))  Culture, blood (routine x 2)     Status: None (Preliminary result)   Collection Time: 11/17/17  9:41 AM  Result Value Ref Range Status  Specimen Description BLOOD RIGHT ANTECUBITAL  Final   Special Requests   Final    BOTTLES DRAWN AEROBIC ONLY Blood Culture adequate volume   Culture   Final    NO GROWTH < 24 HOURS Performed at [facility], [address] [place], [place] [address]    Report Status PENDING  Incomplete  Culture, blood (routine x 2)     Status: None (Preliminary result)   Collection Time: 11/17/17  9:44 AM  Result Value Ref Range Status   Specimen Description BLOOD LEFT THUMB  Final   Special Requests   Final    BOTTLES DRAWN AEROBIC ONLY Blood Culture adequate volume   Culture   Final    NO GROWTH < 24 HOURS Performed at [facility], [place] [address], [place], [place] [address]    Report Status PENDING  Incomplete     Labs: BNP (last 3 results) Recent Labs    07/01/17 1127  BNP 49   Basic Metabolic Panel: Recent Labs  Lab 11/16/17 1702 11/17/17 0941 11/18/17 0411  NA 134* 136 137  K 4.0 3.7 3.9  CL 99 102 105  CO2 24 23 23   GLUCOSE 111* 100* 101*  BUN 13 12 13   CREATININE 0.96 1.02 1.23  CALCIUM 9.0 9.1 8.7*   Liver Function Tests: Recent Labs  Lab 11/16/17 1702  AST 32  ALT 28  ALKPHOS 66  BILITOT 0.8  PROT 7.0  ALBUMIN 3.8   No results for input(s): LIPASE, AMYLASE in the last 168 hours. No results for input(s): AMMONIA in the last 168 hours. CBC: Recent Labs  Lab 11/16/17 1702 11/17/17 0941 11/18/17 0411  WBC 9.4 8.8 8.7  NEUTROABS 7.0  --   --   HGB 13.8 14.5 13.8  HCT 43.0 44.9 42.9  MCV 98.9 98.0 98.6  PLT 234 264 247   Cardiac Enzymes: Recent Labs  Lab 11/16/17 1702  TROPONINI <0.03   BNP: Invalid  input(s): POCBNP CBG: No results for input(s): GLUCAP in the last 168 hours. D-Dimer No results for input(s): DDIMER in the last 72 hours. Hgb A1c No results for input(s): HGBA1C in the last 72 hours. Lipid Profile No results for input(s): CHOL, HDL, LDLCALC, TRIG, CHOLHDL, LDLDIRECT in the last 72 hours. Thyroid function studies No results for input(s): TSH, T4TOTAL, T3FREE, THYROIDAB in the last 72 hours.  Invalid input(s): FREET3 Anemia work up No results for input(s): VITAMINB12, FOLATE, FERRITIN, TIBC, IRON, RETICCTPCT in the last 72 hours. Urinalysis    Component Value Date/Time   COLORURINE YELLOW 11/16/2017 1850   APPEARANCEUR CLEAR 11/16/2017 1850   APPEARANCEUR Clear 05/24/2016 1114   LABSPEC 1.016 11/16/2017 1850   PHURINE 6.0 11/16/2017 1850   GLUCOSEU NEGATIVE 11/16/2017 1850   HGBUR NEGATIVE 11/16/2017 1850   BILIRUBINUR NEGATIVE 11/16/2017 1850   BILIRUBINUR Negative 05/24/2016 1114   KETONESUR 5 (A) 11/16/2017 1850   PROTEINUR 30 (A) 11/16/2017 1850   UROBILINOGEN 0.2 07/04/2013 1050   NITRITE NEGATIVE 11/16/2017 1850   LEUKOCYTESUR NEGATIVE 11/16/2017 1850   LEUKOCYTESUR 1+ (A) 05/24/2016 1114   Sepsis Labs Invalid input(s): PROCALCITONIN,  WBC,  LACTICIDVEN Microbiology Recent Results (from the past 240 hour(s))  Culture, blood (routine x 2)     Status: None (Preliminary result)   Collection Time: 11/17/17  9:41 AM  Result Value Ref Range Status   Specimen Description BLOOD RIGHT ANTECUBITAL  Final   Special Requests   Final    BOTTLES DRAWN AEROBIC ONLY  Blood Culture adequate volume   Culture   Final    NO GROWTH < 24 HOURS Performed at [facility], [place] [address], [place], [place] [address]    Report Status PENDING  Incomplete  Culture, blood (routine x 2)     Status: None (Preliminary result)   Collection Time: 11/17/17  9:44 AM  Result Value Ref Range Status   Specimen Description BLOOD LEFT THUMB  Final   Special Requests   Final     BOTTLES DRAWN AEROBIC ONLY Blood Culture adequate volume   Culture   Final    NO GROWTH < 24 HOURS Performed at [facility], [place] [address] [place], [place] [address]    Report Status PENDING  Incomplete     Time coordinating discharge in minutes: 68  SIGNED:   Debbe Odea, MD  [facility] 11/18/2017, 9:37 AM Pager   If 7PM-7AM, please contact night-coverage [email or web address] Password [email or web address]

## 2017-11-19 DIAGNOSIS — R4701 Aphasia: Secondary | ICD-10-CM | POA: Diagnosis not present

## 2017-11-19 DIAGNOSIS — G35 Multiple sclerosis: Secondary | ICD-10-CM | POA: Diagnosis not present

## 2017-11-19 DIAGNOSIS — Z95818 Presence of other cardiac implants and grafts: Secondary | ICD-10-CM | POA: Diagnosis not present

## 2017-11-19 DIAGNOSIS — I5032 Chronic diastolic (congestive) heart failure: Secondary | ICD-10-CM | POA: Diagnosis not present

## 2017-11-19 DIAGNOSIS — J449 Chronic obstructive pulmonary disease, unspecified: Secondary | ICD-10-CM | POA: Diagnosis not present

## 2017-11-19 DIAGNOSIS — N189 Chronic kidney disease, unspecified: Secondary | ICD-10-CM | POA: Diagnosis not present

## 2017-11-19 DIAGNOSIS — Z79891 Long term (current) use of opiate analgesic: Secondary | ICD-10-CM | POA: Diagnosis not present

## 2017-11-19 DIAGNOSIS — I2510 Atherosclerotic heart disease of native coronary artery without angina pectoris: Secondary | ICD-10-CM | POA: Diagnosis not present

## 2017-11-19 DIAGNOSIS — Z79899 Other long term (current) drug therapy: Secondary | ICD-10-CM | POA: Diagnosis not present

## 2017-11-19 DIAGNOSIS — Z7901 Long term (current) use of anticoagulants: Secondary | ICD-10-CM | POA: Diagnosis not present

## 2017-11-19 DIAGNOSIS — R1310 Dysphagia, unspecified: Secondary | ICD-10-CM | POA: Diagnosis not present

## 2017-11-19 DIAGNOSIS — Z8673 Personal history of transient ischemic attack (TIA), and cerebral infarction without residual deficits: Secondary | ICD-10-CM | POA: Diagnosis not present

## 2017-11-19 DIAGNOSIS — R338 Other retention of urine: Secondary | ICD-10-CM | POA: Diagnosis not present

## 2017-11-19 DIAGNOSIS — N401 Enlarged prostate with lower urinary tract symptoms: Secondary | ICD-10-CM | POA: Diagnosis not present

## 2017-11-19 DIAGNOSIS — F329 Major depressive disorder, single episode, unspecified: Secondary | ICD-10-CM | POA: Diagnosis not present

## 2017-11-19 DIAGNOSIS — I498 Other specified cardiac arrhythmias: Secondary | ICD-10-CM | POA: Diagnosis not present

## 2017-11-19 DIAGNOSIS — G3184 Mild cognitive impairment, so stated: Secondary | ICD-10-CM | POA: Diagnosis not present

## 2017-11-19 DIAGNOSIS — F0391 Unspecified dementia with behavioral disturbance: Secondary | ICD-10-CM | POA: Diagnosis not present

## 2017-11-19 DIAGNOSIS — Z87891 Personal history of nicotine dependence: Secondary | ICD-10-CM | POA: Diagnosis not present

## 2017-11-22 DIAGNOSIS — I2510 Atherosclerotic heart disease of native coronary artery without angina pectoris: Secondary | ICD-10-CM | POA: Diagnosis not present

## 2017-11-22 DIAGNOSIS — F0391 Unspecified dementia with behavioral disturbance: Secondary | ICD-10-CM | POA: Diagnosis not present

## 2017-11-22 DIAGNOSIS — I5032 Chronic diastolic (congestive) heart failure: Secondary | ICD-10-CM | POA: Diagnosis not present

## 2017-11-22 DIAGNOSIS — G3184 Mild cognitive impairment, so stated: Secondary | ICD-10-CM | POA: Diagnosis not present

## 2017-11-22 DIAGNOSIS — G35 Multiple sclerosis: Secondary | ICD-10-CM | POA: Diagnosis not present

## 2017-11-22 DIAGNOSIS — I498 Other specified cardiac arrhythmias: Secondary | ICD-10-CM | POA: Diagnosis not present

## 2017-11-22 LAB — CULTURE, BLOOD (ROUTINE X 2)
CULTURE: NO GROWTH
CULTURE: NO GROWTH
SPECIAL REQUESTS: ADEQUATE
Special Requests: ADEQUATE

## 2017-11-23 DIAGNOSIS — I2510 Atherosclerotic heart disease of native coronary artery without angina pectoris: Secondary | ICD-10-CM | POA: Diagnosis not present

## 2017-11-23 DIAGNOSIS — G35 Multiple sclerosis: Secondary | ICD-10-CM | POA: Diagnosis not present

## 2017-11-23 DIAGNOSIS — G3184 Mild cognitive impairment, so stated: Secondary | ICD-10-CM | POA: Diagnosis not present

## 2017-11-23 DIAGNOSIS — F0391 Unspecified dementia with behavioral disturbance: Secondary | ICD-10-CM | POA: Diagnosis not present

## 2017-11-23 DIAGNOSIS — I498 Other specified cardiac arrhythmias: Secondary | ICD-10-CM | POA: Diagnosis not present

## 2017-11-23 DIAGNOSIS — I5032 Chronic diastolic (congestive) heart failure: Secondary | ICD-10-CM | POA: Diagnosis not present

## 2017-11-24 ENCOUNTER — Inpatient Hospital Stay: Payer: Medicare Other | Admitting: Family Medicine

## 2017-11-24 DIAGNOSIS — G3184 Mild cognitive impairment, so stated: Secondary | ICD-10-CM | POA: Diagnosis not present

## 2017-11-24 DIAGNOSIS — I5032 Chronic diastolic (congestive) heart failure: Secondary | ICD-10-CM | POA: Diagnosis not present

## 2017-11-24 DIAGNOSIS — I498 Other specified cardiac arrhythmias: Secondary | ICD-10-CM | POA: Diagnosis not present

## 2017-11-24 DIAGNOSIS — F0391 Unspecified dementia with behavioral disturbance: Secondary | ICD-10-CM | POA: Diagnosis not present

## 2017-11-24 DIAGNOSIS — I2510 Atherosclerotic heart disease of native coronary artery without angina pectoris: Secondary | ICD-10-CM | POA: Diagnosis not present

## 2017-11-24 DIAGNOSIS — G35 Multiple sclerosis: Secondary | ICD-10-CM | POA: Diagnosis not present

## 2017-11-25 DIAGNOSIS — I2510 Atherosclerotic heart disease of native coronary artery without angina pectoris: Secondary | ICD-10-CM | POA: Diagnosis not present

## 2017-11-25 DIAGNOSIS — F0391 Unspecified dementia with behavioral disturbance: Secondary | ICD-10-CM | POA: Diagnosis not present

## 2017-11-25 DIAGNOSIS — G3184 Mild cognitive impairment, so stated: Secondary | ICD-10-CM | POA: Diagnosis not present

## 2017-11-25 DIAGNOSIS — I5032 Chronic diastolic (congestive) heart failure: Secondary | ICD-10-CM | POA: Diagnosis not present

## 2017-11-25 DIAGNOSIS — I498 Other specified cardiac arrhythmias: Secondary | ICD-10-CM | POA: Diagnosis not present

## 2017-11-25 DIAGNOSIS — G35 Multiple sclerosis: Secondary | ICD-10-CM | POA: Diagnosis not present

## 2017-11-28 DIAGNOSIS — F0391 Unspecified dementia with behavioral disturbance: Secondary | ICD-10-CM | POA: Diagnosis not present

## 2017-11-28 DIAGNOSIS — G35 Multiple sclerosis: Secondary | ICD-10-CM | POA: Diagnosis not present

## 2017-11-28 DIAGNOSIS — I498 Other specified cardiac arrhythmias: Secondary | ICD-10-CM | POA: Diagnosis not present

## 2017-11-28 DIAGNOSIS — I2510 Atherosclerotic heart disease of native coronary artery without angina pectoris: Secondary | ICD-10-CM | POA: Diagnosis not present

## 2017-11-28 DIAGNOSIS — G3184 Mild cognitive impairment, so stated: Secondary | ICD-10-CM | POA: Diagnosis not present

## 2017-11-28 DIAGNOSIS — I5032 Chronic diastolic (congestive) heart failure: Secondary | ICD-10-CM | POA: Diagnosis not present

## 2017-11-29 DIAGNOSIS — I5032 Chronic diastolic (congestive) heart failure: Secondary | ICD-10-CM | POA: Diagnosis not present

## 2017-11-29 DIAGNOSIS — I2510 Atherosclerotic heart disease of native coronary artery without angina pectoris: Secondary | ICD-10-CM | POA: Diagnosis not present

## 2017-11-29 DIAGNOSIS — G3184 Mild cognitive impairment, so stated: Secondary | ICD-10-CM | POA: Diagnosis not present

## 2017-11-29 DIAGNOSIS — F0391 Unspecified dementia with behavioral disturbance: Secondary | ICD-10-CM | POA: Diagnosis not present

## 2017-11-29 DIAGNOSIS — I498 Other specified cardiac arrhythmias: Secondary | ICD-10-CM | POA: Diagnosis not present

## 2017-11-29 DIAGNOSIS — G35 Multiple sclerosis: Secondary | ICD-10-CM | POA: Diagnosis not present

## 2017-12-02 DIAGNOSIS — F0391 Unspecified dementia with behavioral disturbance: Secondary | ICD-10-CM | POA: Diagnosis not present

## 2017-12-02 DIAGNOSIS — I2510 Atherosclerotic heart disease of native coronary artery without angina pectoris: Secondary | ICD-10-CM | POA: Diagnosis not present

## 2017-12-02 DIAGNOSIS — G3184 Mild cognitive impairment, so stated: Secondary | ICD-10-CM | POA: Diagnosis not present

## 2017-12-02 DIAGNOSIS — I5032 Chronic diastolic (congestive) heart failure: Secondary | ICD-10-CM | POA: Diagnosis not present

## 2017-12-02 DIAGNOSIS — I498 Other specified cardiac arrhythmias: Secondary | ICD-10-CM | POA: Diagnosis not present

## 2017-12-02 DIAGNOSIS — G35 Multiple sclerosis: Secondary | ICD-10-CM | POA: Diagnosis not present

## 2017-12-05 ENCOUNTER — Encounter: Payer: Self-pay | Admitting: Family Medicine

## 2017-12-05 ENCOUNTER — Ambulatory Visit (INDEPENDENT_AMBULATORY_CARE_PROVIDER_SITE_OTHER): Payer: Medicare Other | Admitting: Family Medicine

## 2017-12-05 ENCOUNTER — Telehealth: Payer: Self-pay | Admitting: Family Medicine

## 2017-12-05 VITALS — BP 150/78 | HR 88 | Temp 97.6°F | Resp 18 | Ht 71.0 in | Wt 239.0 lb

## 2017-12-05 DIAGNOSIS — Z09 Encounter for follow-up examination after completed treatment for conditions other than malignant neoplasm: Secondary | ICD-10-CM | POA: Diagnosis not present

## 2017-12-05 DIAGNOSIS — I639 Cerebral infarction, unspecified: Secondary | ICD-10-CM

## 2017-12-05 MED ORDER — LOSARTAN POTASSIUM 100 MG PO TABS: 100.0000 mg | ORAL_TABLET | Freq: Every day | ORAL | 2 refills | Status: DC

## 2017-12-05 MED ORDER — VENLAFAXINE HCL ER 150 MG PO CP24: 150.0000 mg | ORAL_CAPSULE | Freq: Every day | ORAL | 3 refills | Status: DC

## 2017-12-05 MED ORDER — DOXAZOSIN MESYLATE 2 MG PO TABS: 2.0000 mg | ORAL_TABLET | Freq: Every day | ORAL | 2 refills | Status: DC

## 2017-12-05 NOTE — Telephone Encounter (Signed)
Patient's wife called.  His blood pressure has been elevated for the past few days.  He recently gotten out of the hospital with altered mental status.  There were no changes to his blood pressure medication per report.  She listed off all of his medications states that he was taking Lasix 40mg  as well as Toprol 100mg  and doxazosin 2 mg.  Has underlying dementia therefore she does his medications.  Physical therapy have been at the home that day blood pressure was 176/108.  Recommended that she give him 4 mg of the doxazosin until he comes in for his follow-up appointment on Monday.  Continue the Toprol at 100 mg in the Lasix.

## 2017-12-05 NOTE — Progress Notes (Signed)
Subjective:    Patient ID: [name] [name], male    DOB: January 22, 1940, 77 y.o.   MRN: [id number]  HPI  10/12/16 Wife brings him patient for evaluation to determine should he still be driving. Past medical history is significant for progressive multiple sclerosis, multiple ischemic strokes, chronic osteoarthritic pain on 3 time a day hydrocodone WHICH contribute to this discussion. Physically, the patient has muscle strength 5 over 5 equal and symmetric in the arms and in the legs with normal reflexes. His physical reaction time is normal to catching a dropped object. Cerebellar exam is normal today however on Mini-Mental status exam, the patient tells me that the year is 1983. He is unable to correctly identify the month. He can #2 out of 3 objects on recall. He scores 3 out of 5 on world spelling in reverse. He is unable to perform serial sevens. It takes him 25 seconds to draw a clock although he does draw correctly. However he is unable to recognize patterns or to correctly copy intersecting pentagons.  At that time, my plan was: Patient has age-related memory loss compounded by multiple ischemic strokes, compounded by progressive multiple sclerosis, compounded by polypharmacy and chronic narcotic use all of which makes him a danger to drive. In general, I recommended that the patient not drive. Legally, I believe he would still qualify for restricted status. He must drive less than 45 miles an hour. He must avoid Interstate driving or driving on highways and remain driving only on 2 lane roads where there is less travel. I would not recommend driving at night or during conditions that would compromise his reaction time. After discussing these restrictions I explained to the patient and his wife that if he were my father, I would not recommend driving.  31/49/70 Patient's wife removed his car and had it placed at her daughter's home. This angered the patient dramatically. Furthermore, the patient's  wife is concerned that he is extremely depressed and that he may harm himself. She also feels threatened and the home due to his outbursts of uncontrolled anger. Therefore she had her daughter and son-in-law will remove the guns from his home. Patient feels extremely upset by this. He feels disrespected. He feels violated. Patient's wife states that he is extremely angry. His mood swings are rapid and fluctuating. He is extremely depressed. He reports anhedonia. He is frustrated by his memory loss. He is frustrated by his inability to drive. He is frustrated by having his personal items removed from his home without his consent. He feels like he is losing his independence and his masculinity. In the past he has had symptoms of depression with bipolar tendencies. At one time we had him on Depakote as a mood stabilizer which seemed to help dramatically. He is also taking Abilify but this was discontinued due to weight gain. Patient's wife feels unsafe at home. Patient denies suicidality. Patient denies any homicidal ideation. He states that his wife is blowing things out of proportion.  At that time, my plan was: I feel both the patient and his wife have valid arguments. The wife does not feel safe with him having guns in the home nor does she feel safe with him driving. Patient is frustrated by his loss of independence and the perceived disrespect of having his guns and car taken from him without his consent. Wife states that she did not consent the patient because she was afraid of his uncontrolled anger when she broached the subject with  him couple of his memory loss and poor judgment and impulsivity. Therefore I will treat the patient with Abilify 5 mg by mouth daily as a mood stabilizer and management for depression and recheck the patient in 2 weeks. I have agreed that he should not be driving. I have also agreed that there should not be loaded weapons in the home. However I agreed that the patient should be  asked and talked to is a human being and that showing him disrespect may help mollify some of his anger and frustration. Patient and wife agree to try  09/13/17 Patient is here today with his wife for follow-up.  His memory seems to be worsening dramatically.  He recently met with an occupational therapist, however, who has cleared him to drive back and forth to the grocery store with his wife present in the car.  However I am concerned by his apparently worsening memory.  I performed a Mini-Mental status exam today.  The patient is able to tell me the date correctly although it takes him a little bit of time.  He is able to tell me the location quickly.  He is able to remember 3 out of 3 objects quickly however on recall he can only recall 2 out of the 3.  Patient has a difficult time performing serial sevens.  He is able to perform for correctly out of 5 however it takes him repeated prompts and multiple attempts to make the correct answer and this is a patient who has a graduate degree in mathematics.  I performed testing on pattern recognition drawing a line from a to 1, 1 to B, B to 2.  Patient was unable to recognize the pattern despite explaining to me what the pattern was and that the numbers correspond to the previous letter.  He was unable to then draw the correct line to the next letter and number in that pattern.  On clock drawing, he correctly draws a circle with the correct numbers however they are all jumbled at the end with poor spacing.  He then forgot the time I told him to draw.  All of this to me in a patient with a graduate college degree shows early short-term memory problems and slight cognitive impairment.  I believe this is primarily due to vascular dementia from his numerous strokes coupled with possible MS.  They are here today to discuss treatment options.  At that time, my plan was: I have recommended that the patient begin Aricept 5 mg a day and I would like to recheck him in 1 month  and uptitrate to 10 mg if tolerable.  I will check a CBC, CMP, vitamin B12, and TSH.  I believe the patient is suffering from a combination of vascular dementia as well as his underlying MS. I have also recommended avoiding polypharmacy and I have strongly encouraged the wife to manage his medications.  I want to limit his pain medication as much as possible.  He can no longer self administer the medication.  I have recommended that his wife hold the medication and give it to him only when he is in significant pain.  Otherwise I have encouraged Tylenol.  I believe the patient has poor impulse control and frequently may take 2 or 3 pain pills at a time when he is hurting which I believe further exacerbates his underlying cognitive impairment.  Reassess in 1 month  10/13/17 Patient is tolerating Aricept 5 mg a day.  He denies any  abnormal dreams, hallucinations, stomach upset, dizziness, or depression.  Obviously, patient nor his family can tell any difference.  Both he and his wife are arguing throughout the visit today.  His wife is essentially trying to keep him at home.  She will not let him drive a car which I agree with.  She will not let him operate a lot more.  She is not letting him go to the gym unless she accompanies him.  Although this leaves the patient feeling suffocated and stifled.  This leads to arguments and frustration which are causing significant problems in the marriage. At that time, my plan was: Increase Aricept to 10 mg a day.  I discussed with the wife allowing the patient time to be away from her.  I agree that he should not be driving a car.  However I see no serious risk in him mowing the yard at his current state.  I also believe that if she can have a family member drive him to the gym 2 to 3 days a week to allow him to get exercise this would give him an outlet to release his energy and frustration as well as provide her therapeutic break to help prevent and treat caregiver  fatigue.  12/05/17 Recently admitted with AMS.  I have copied relevant portions of the DC summary below for reference:  Admit date: 11/16/2017 Discharge date: 11/18/2017  Admitted From: home  Disposition:  home    Home Health:  ordered     Discharge Condition:  stable   CODE STATUS:  DNR   Consultations:  none    Discharge Diagnoses:  Principal Problem:   Acute encephalopathy Active Problems:   MS (multiple sclerosis) ([place])   History of stroke   Cognitive impairment   Essential hypertension       Brief Summary: Coltrane Tugwell Willardis a 77 y.o.malewith medical history significant ofdementia,multiple ischemic CVAss/p ILR placement and on eliquis, Brugada pattern on ECG, COPD, progressiveMS, non occlusive CAD.  Patient sent in to ED by wife with c/o confusion and agitation. Per wife patient has been increasingly confused, with agitation and aggression at times since Monday. No PO meds since Monday.   Hospital Course:  Acute encephalopathy - initially thought to be Wernicke's encephalopathy due to daily drinking however, no major improvement noted with Thiamine via IV but he is taking his medications appropriately - no underlying infections or abnormal blood work  - CT head and MRI noted below which are unrevealing - I have reviewed the notes from his PCP and I feel that his dementia is progressing-  - PT recommended CIR but his wife is wanting to take him home with home health to continue taking care of him  - she does state that he is getting agitated at night and also that Abilify has never helped - thus I have d/c'd the Abilify (per psych, this does need to be tapered) and started Seroquel at bedtime- she can start with 25 and give an extra dose of 25 if it is not helping but not go higher than this dose. Advised for her to take him to his PCP in about 3-4 days for a follow up.  12/05/17 Patient is here today for follow-up.  He is accompanied by  his wife.  His blood pressure has been elevated.  He called our on-call doctor and his doxazosin was increased from 2 mg a day to 4 mg a day.  However the patient has not been taking his  losartan.  Apparently he ran out of this several weeks ago.  He is also abruptly discontinued his Effexor approximately 3 weeks ago around the time he was hospitalized.  His wife states that he is back to his baseline.  She denies any further hypersomnolence or hallucinations since he has been discharged from the hospital.  He is taking Seroquel at bedtime.  He has discontinued the Abilify.  Together we reviewed his medication list at length.  He uses Requip for restless leg syndrome however I am concerned about the patient receiving a dopaminergic medication given the fact he is hallucinating at times.  I am also concerned about his polypharmacy.  Therefore I recommended discontinuing this.  I am concerned about his abrupt discontinuation of Effexor possibly exacerbating his altered mental status.  Therefore I recommended that we resume this albeit at a lower dose.  He continues to drink 2-3 hard liquor drinks a day.  He has been greatly reduced his consumption of pain medication and is using only 2 to 3 pills a week for his arthritic pain  Past Medical History:  Diagnosis Date  . Arthritis    s/p TKR  . Bladder cancer (HCC)    [facility], [facility] low grade papillary urothileal carcinoma  . BPH (benign prostatic hyperplasia)   . Brugada syndrome    Possible Type II Brugada ECG pattern. No family history of SCD, no syncope, no tachypalpitations.  Marland Kitchen CAD (coronary artery disease)    a. LHC 1/15 - mid LCx 50 and 60%, proximal RCA 50%  . Chronic diastolic CHF (congestive heart failure) ([name])   . CKD (chronic kidney disease)   . COPD (chronic obstructive pulmonary disease) ([place])    Prior heavy smoker. PFTs (3/11): FVC 87%, FEV1 73%, ratio 0.57, DLCO 75%, TLC 121%. Moderate obstructive defect. PFTs (9/15): Only minimal obstruction,  sugPrior heavy smoker. PFTs (3/11): FVC 87%, FEV1 73%, ratio 0.57, DLCO 75%, TLC 121%. Moderate obstructive defect. PFTs (9/15) minimal obstruction, poss asthma component   . Depression    with bipolar tendencies  . DOE (dyspnea on exertion)    a. Myoview 8/09- EF 57%, no ischemia // b. Myoview (3/11) EF 68%, diaphragmatic attenuation, no ischemia //  c Echo (4/11) EF 68-08%, mild diastolic dysfunction, PASP 38 mmHg // d. PFTs 9/15 minimal obstruction  /  e. CT negative for ILD  . GERD (gastroesophageal reflux disease)   . History of Doppler ultrasound    Carotid US 3/11 negative for significant stenosis  //  carotid US 6/17: Mild bilateral plaque, 1-39% ICA  . History of echocardiogram    a. Echo 12/14 Inferior and distal septal HK, mild LVH, EF 50-55%, mild MR, mild LAE  //  b. Echo 6/17: Mild focal basal septal hypertrophy, EF 60-65%, normal wall motion, grade 1 diastolic dysfunction, mild AI  . HLD (hyperlipidemia)   . Hypertensive heart disease with CHF (congestive heart failure) ([place]) 04/08/2009  . Low testosterone   . Lung nodule    a. CT in 2015 >> PET in 12/15 not sugg of malignancy // CT 6/19: continue to look benign  . LVH (left ventricular hypertrophy)    a. Echo 12/14 Inferior and distal septal HK, mild LVH, EF 50-55%, mild MR, mild LAE  . Mild dementia ([place])   . Mitral regurgitation    Echo (4/11) with PISA ERO 0.3 cm^2 and regurgitant volume 41 mL (moderate MR). Echo (12/14) with mild MR.   . MS (multiple sclerosis) ([place])   . MS (  multiple sclerosis) ([place])   . OSA (obstructive sleep apnea)   . Right bundle branch block   . Stroke ([place])   . Thoracic aortic aneurysm (HCC)    4.1 cm 2017 // Chest CTA 6/19: stable ascending thoracic aortic aneurysm 4.1 cm, aortic atherosclerosis, stable RML nodular densities (appears benign), emphysema >> FU 1 year  . Urinary retention with incomplete bladder emptying    receives botox injections and treatment for BPH through [facility]   Past  Surgical History:  Procedure Laterality Date  . APPENDECTOMY    . EP IMPLANTABLE DEVICE N/A 06/23/2015   Procedure: Loop Recorder Insertion;  Surgeon: Deboraha Sprang, MD;  Location: [facility];  Service: Cardiovascular;  Laterality: N/A;  . HEMORRHOID SURGERY    . LEFT HEART CATHETERIZATION WITH CORONARY ANGIOGRAM N/A 01/05/2013   Procedure: LEFT HEART CATHETERIZATION WITH CORONARY ANGIOGRAM;  Surgeon: Larey Dresser, MD;  Location[facility] C[facility] LAB;  Service: Cardiovascular;  Laterality: N/A;  . ROTATOR CUFF REPAIR    . TEE WITHOUT CARDIOVERSION N/A 06/23/2015   Procedure: TRANSESOPHAGEAL ECHOCARDIOGRAM (TEE);  Surgeon: Fay Records, MD;  Location[facility] ENDOSCOPY;  Service: Cardiovascular;  Laterality: N/A;  . TONSILLECTOMY    . TOTAL KNEE ARTHROPLASTY Right   . TRANSURETHRAL RESECTION OF PROSTATE     Current Outpatient Medications on File Prior to Visit  Medication Sig Dispense Refill  . acetaminophen (TYLENOL) 325 MG tablet Take 2 tablets (650 mg total) by mouth every 4 (four) hours as needed for mild pain (or temp > 37.5 C (99.5 F)).    Marland Kitchen atorvastatin (LIPITOR) 40 MG tablet TAKE 1 TABLET DAILY (Patient taking differently: Take 40 mg by mouth daily. ) 90 tablet 2  . Biotin 1000 MCG tablet Take 1,000 mcg by mouth 3 (three) times daily.    . cholecalciferol (VITAMIN D) 400 units TABS tablet Take 400 Units by mouth daily.     . Cyanocobalamin (B-12) 1000 MCG CAPS Take 1,000 mcg by mouth daily.     Marland Kitchen donepezil (ARICEPT) 10 MG tablet Take 1 tablet (10 mg total) by mouth at bedtime. 90 tablet 3  . ELIQUIS 5 MG TABS tablet TAKE 1 TABLET TWICE A DAY 268 tablet 3  . folic acid (FOLVITE) 1 MG tablet Take 1 mg by mouth daily.    . furosemide (LASIX) 40 MG tablet TAKE 1 TABLET(40 MG) BY MOUTH DAILY (Patient taking differently: Take 40 mg by mouth daily. ) 90 tablet 1  . gabapentin (NEURONTIN) 300 MG capsule Take 300 mg by mouth 2 (two) times daily. Patient taking 330m twice daily    .  HYDROcodone-acetaminophen (NORCO) 10-325 MG tablet Take 1 tablet by mouth every 6 (six) hours as needed for moderate pain or severe pain (DX: G89.4, (G35 - MS)). 60 tablet 0  . losartan (COZAAR) 100 MG tablet Take 1 tablet (100 mg total) by mouth daily. 90 tablet 3  . MAGNESIUM CITRATE PO Take 0.5-1 Bottles by mouth once as needed (for mild constipation).     . metoprolol succinate (TOPROL-XL) 100 MG 24 hr tablet Take 1 1/2 tablets (150 mg total) by mouth daily. 135 tablet 3  . Multiple Vitamin (MULTIVITAMIN WITH MINERALS) TABS tablet Take 1 tablet by mouth daily.    . pantoprazole (PROTONIX) 40 MG tablet TAKE 1 TABLET DAILY (Patient taking differently: Take 40 mg by mouth daily. ) 90 tablet 3  . PROAIR HFA 108 (90 Base) MCG/ACT inhaler USE 1 TO 2 INHALATIONS EVERY 6 HOURS  AS NEEDED FOR WHEEZING OR SHORTNESS OF BREATH (Patient taking differently: 1-2 puffs every 6 (six) hours as needed for wheezing or shortness of breath. ) 25.5 g 4  . prochlorperazine (COMPAZINE) 25 MG suppository Place 25 mg rectally every 12 (twelve) hours as needed for nausea or vomiting.    Marland Kitchen QUEtiapine (SEROQUEL) 25 MG tablet Take 1-2 tablets (25-50 mg total) by mouth at bedtime. 30 tablet 0  . rOPINIRole (REQUIP) 1 MG tablet TAKE 1 TABLET AT BEDTIME (Patient taking differently: Take 1 mg by mouth at bedtime. ) 90 tablet 3  . senna-docusate (SENOKOT-S) 8.6-50 MG tablet Take 1 tablet by mouth at bedtime as needed for mild constipation.    . Tiotropium Bromide-Olodaterol (STIOLTO RESPIMAT) 2.5-2.5 MCG/ACT AERS Inhale 2 puffs into the lungs daily. 1 Inhaler 11  . venlafaxine XR (EFFEXOR-XR) 150 MG 24 hr capsule Take 1 capsule (150 mg total) by mouth 2 (two) times daily. 180 capsule 3   No current facility-administered medications on file prior to visit.    Allergies  Allergen Reactions  . Alcohol-Sulfur [Sulfur] Other (See Comments)    Patient was suspected of having "Brugada Syndrome"  . Bupivacaine Other (See Comments)     Patient was suspected of having "Brugada Syndrome"  . Clomipramine Hcl Other (See Comments)    Patient was suspected of having "Brugada Syndrome"  . Flecainide Other (See Comments)    Patient was suspected of having "Brugada Syndrome"  . Lithium Other (See Comments)    Patient was suspected of having "Brugada Syndrome"  . Acetylcholine     Patient was suspected of having "Brugada Syndrome": (BrS) is a genetic condition that results in abnormal electrical activity within the heart, increasing the risk of sudden cardiac death. Those affected may have episodes of passing out.  . Amitriptyline Other (See Comments)    Patient was suspected of having "Brugada Syndrome"  . Cocaine Other (See Comments)    Patient was suspected of having "Brugada Syndrome"  . Desipramine Other (See Comments)    Patient was suspected of having "Brugada Syndrome"  . Ergonovine Other (See Comments)    Patient was suspected of having "Brugada Syndrome"  . Loxapine Other (See Comments)    Patient was suspected of having "Brugada Syndrome"  . Nortriptyline Other (See Comments)    Patient was suspected of having "Brugada Syndrome"  . Oxcarbazepine Other (See Comments)    Patient was suspected of having "Brugada Syndrome"  . Procainamide Other (See Comments)    Patient was suspected of having "Brugada Syndrome"  . Procaine Other (See Comments)    Patient was suspected of having "Brugada Syndrome"  . Propafenone Other (See Comments)    Patient was suspected of having "Brugada Syndrome"  . Propofol Other (See Comments)    Patient was suspected of having "Brugada Syndrome"  . Trifluoperazine Other (See Comments)    Patient was suspected of having "Brugada Syndrome"   Social History   Socioeconomic History  . Marital status: Married    Spouse name: Not on file  . Number of children: Not on file  . Years of education: Not on file  . Highest education level: Not on file  Occupational History  . Occupation:  retired    Fish farm manager: RETIRED  Social Needs  . Financial resource strain: Not on file  . Food insecurity:    Worry: Not on file    Inability: Not on file  . Transportation needs:    Medical: Not on file  Non-medical: Not on file  Tobacco Use  . Smoking status: Former Smoker    Packs/day: 2.00    Years: 40.00    Pack years: 80.00    Types: Cigarettes    Last attempt to quit: 01/04/1993    Years since quitting: 24.9  . Smokeless tobacco: Former Systems developer    Types: Chew  Substance and Sexual Activity  . Alcohol use: Yes    Alcohol/week: 25.0 standard drinks    Types: 25 Cans of beer per week    Comment: 12 pack a week  . Drug use: No  . Sexual activity: Not on file  Lifestyle  . Physical activity:    Days per week: Not on file    Minutes per session: Not on file  . Stress: Not on file  Relationships  . Social connections:    Talks on phone: Not on file    Gets together: Not on file    Attends religious service: Not on file    Active member of club or organization: Not on file    Attends meetings of clubs or organizations: Not on file    Relationship status: Not on file  . Intimate partner violence:    Fear of current or ex partner: Not on file    Emotionally abused: Not on file    Physically abused: Not on file    Forced sexual activity: Not on file  Other Topics Concern  . Not on file  Social History Narrative   Retired from the [employer] after 20 years   [place] Veteran      Review of Systems  All other systems reviewed and are negative.      Objective:   Physical Exam  Constitutional: He is oriented to person, place, and time. He appears well-developed and well-nourished. No distress.  Cardiovascular: Normal rate, regular rhythm, normal heart sounds and intact distal pulses.  No murmur heard. Pulmonary/Chest: Effort normal and breath sounds normal. No respiratory distress. He has no wheezes. He has no rales.  Neurological: He is alert and oriented to  person, place, and time. He has normal reflexes. No cranial nerve deficit. He exhibits normal muscle tone. Coordination normal.  Skin: He is not diaphoretic.  Psychiatric: He has a normal mood and affect. His behavior is normal. Judgment and thought content normal.  Vitals reviewed.         Assessment & Plan:  Hospital discharge follow-up  The majority of our encounter today was spent discussing polypharmacy.  I recommended discontinuation of Requip given his recent hallucinations and altered mental status.  I will try to minimize the amount of centrally acting medication in this patient as I believe his dementia is worsening.  Therefore I am encouraged the patient to discontinue Requip.  I also encourage the patient to reduce his consumption of alcohol to avoid worsening his dementia and causing increased confusion.  I continue to encourage the family to decrease the use of narcotics.  We will discontinue pantoprazole as the patient does not seem to require this at the present time.  I will have him resume his losartan 100 mg a day in addition to doxazosin 2 mg a day and metoprolol 150 mg a day and recheck his blood pressure next week to see how he is doing.

## 2017-12-06 ENCOUNTER — Other Ambulatory Visit: Payer: Self-pay | Admitting: Family Medicine

## 2017-12-06 DIAGNOSIS — I498 Other specified cardiac arrhythmias: Secondary | ICD-10-CM | POA: Diagnosis not present

## 2017-12-06 DIAGNOSIS — I5032 Chronic diastolic (congestive) heart failure: Secondary | ICD-10-CM | POA: Diagnosis not present

## 2017-12-06 DIAGNOSIS — G3184 Mild cognitive impairment, so stated: Secondary | ICD-10-CM | POA: Diagnosis not present

## 2017-12-06 DIAGNOSIS — G35 Multiple sclerosis: Secondary | ICD-10-CM | POA: Diagnosis not present

## 2017-12-06 DIAGNOSIS — I2510 Atherosclerotic heart disease of native coronary artery without angina pectoris: Secondary | ICD-10-CM | POA: Diagnosis not present

## 2017-12-06 DIAGNOSIS — F0391 Unspecified dementia with behavioral disturbance: Secondary | ICD-10-CM | POA: Diagnosis not present

## 2017-12-07 DIAGNOSIS — G35 Multiple sclerosis: Secondary | ICD-10-CM | POA: Diagnosis not present

## 2017-12-07 DIAGNOSIS — I2510 Atherosclerotic heart disease of native coronary artery without angina pectoris: Secondary | ICD-10-CM | POA: Diagnosis not present

## 2017-12-07 DIAGNOSIS — F0391 Unspecified dementia with behavioral disturbance: Secondary | ICD-10-CM | POA: Diagnosis not present

## 2017-12-07 DIAGNOSIS — I5032 Chronic diastolic (congestive) heart failure: Secondary | ICD-10-CM | POA: Diagnosis not present

## 2017-12-07 DIAGNOSIS — I498 Other specified cardiac arrhythmias: Secondary | ICD-10-CM | POA: Diagnosis not present

## 2017-12-07 DIAGNOSIS — G3184 Mild cognitive impairment, so stated: Secondary | ICD-10-CM | POA: Diagnosis not present

## 2017-12-08 DIAGNOSIS — I498 Other specified cardiac arrhythmias: Secondary | ICD-10-CM | POA: Diagnosis not present

## 2017-12-08 DIAGNOSIS — I2510 Atherosclerotic heart disease of native coronary artery without angina pectoris: Secondary | ICD-10-CM | POA: Diagnosis not present

## 2017-12-08 DIAGNOSIS — G35 Multiple sclerosis: Secondary | ICD-10-CM | POA: Diagnosis not present

## 2017-12-08 DIAGNOSIS — G3184 Mild cognitive impairment, so stated: Secondary | ICD-10-CM | POA: Diagnosis not present

## 2017-12-08 DIAGNOSIS — I5032 Chronic diastolic (congestive) heart failure: Secondary | ICD-10-CM | POA: Diagnosis not present

## 2017-12-08 DIAGNOSIS — F0391 Unspecified dementia with behavioral disturbance: Secondary | ICD-10-CM | POA: Diagnosis not present

## 2017-12-12 ENCOUNTER — Ambulatory Visit (INDEPENDENT_AMBULATORY_CARE_PROVIDER_SITE_OTHER): Payer: Medicare Other | Admitting: Family Medicine

## 2017-12-12 ENCOUNTER — Encounter: Payer: Self-pay | Admitting: Family Medicine

## 2017-12-12 ENCOUNTER — Ambulatory Visit (INDEPENDENT_AMBULATORY_CARE_PROVIDER_SITE_OTHER): Payer: Medicare Other

## 2017-12-12 VITALS — BP 110/60 | HR 80 | Temp 97.7°F | Resp 20 | Ht 71.0 in | Wt 243.0 lb

## 2017-12-12 DIAGNOSIS — I10 Essential (primary) hypertension: Secondary | ICD-10-CM

## 2017-12-12 DIAGNOSIS — I639 Cerebral infarction, unspecified: Secondary | ICD-10-CM

## 2017-12-12 DIAGNOSIS — I5032 Chronic diastolic (congestive) heart failure: Secondary | ICD-10-CM

## 2017-12-12 NOTE — Progress Notes (Signed)
Subjective:    Patient ID: [name] [name], male    DOB: 09/13/1940, 77 y.o.   MRN: [id number]  HPI  10/12/16 Wife brings him patient for evaluation to determine should he still be driving. Past medical history is significant for progressive multiple sclerosis, multiple ischemic strokes, chronic osteoarthritic pain on 3 time a day hydrocodone WHICH contribute to this discussion. Physically, the patient has muscle strength 5 over 5 equal and symmetric in the arms and in the legs with normal reflexes. His physical reaction time is normal to catching a dropped object. Cerebellar exam is normal today however on Mini-Mental status exam, the patient tells me that the year is 1983. He is unable to correctly identify the month. He can #2 out of 3 objects on recall. He scores 3 out of 5 on world spelling in reverse. He is unable to perform serial sevens. It takes him 25 seconds to draw a clock although he does draw correctly. However he is unable to recognize patterns or to correctly copy intersecting pentagons.  At that time, my plan was: Patient has age-related memory loss compounded by multiple ischemic strokes, compounded by progressive multiple sclerosis, compounded by polypharmacy and chronic narcotic use all of which makes him a danger to drive. In general, I recommended that the patient not drive. Legally, I believe he would still qualify for restricted status. He must drive less than 45 miles an hour. He must avoid Interstate driving or driving on highways and remain driving only on 2 lane roads where there is less travel. I would not recommend driving at night or during conditions that would compromise his reaction time. After discussing these restrictions I explained to the patient and his wife that if he were my father, I would not recommend driving.  13/24/40 Patient's wife removed his car and had it placed at her daughter's home. This angered the patient dramatically. Furthermore, the patient's  wife is concerned that he is extremely depressed and that he may harm himself. She also feels threatened and the home due to his outbursts of uncontrolled anger. Therefore she had her daughter and son-in-law will remove the guns from his home. Patient feels extremely upset by this. He feels disrespected. He feels violated. Patient's wife states that he is extremely angry. His mood swings are rapid and fluctuating. He is extremely depressed. He reports anhedonia. He is frustrated by his memory loss. He is frustrated by his inability to drive. He is frustrated by having his personal items removed from his home without his consent. He feels like he is losing his independence and his masculinity. In the past he has had symptoms of depression with bipolar tendencies. At one time we had him on Depakote as a mood stabilizer which seemed to help dramatically. He is also taking Abilify but this was discontinued due to weight gain. Patient's wife feels unsafe at home. Patient denies suicidality. Patient denies any homicidal ideation. He states that his wife is blowing things out of proportion.  At that time, my plan was: I feel both the patient and his wife have valid arguments. The wife does not feel safe with him having guns in the home nor does she feel safe with him driving. Patient is frustrated by his loss of independence and the perceived disrespect of having his guns and car taken from him without his consent. Wife states that she did not consent the patient because she was afraid of his uncontrolled anger when she broached the subject with  him couple of his memory loss and poor judgment and impulsivity. Therefore I will treat the patient with Abilify 5 mg by mouth daily as a mood stabilizer and management for depression and recheck the patient in 2 weeks. I have agreed that he should not be driving. I have also agreed that there should not be loaded weapons in the home. However I agreed that the patient should be  asked and talked to is a human being and that showing him disrespect may help mollify some of his anger and frustration. Patient and wife agree to try  09/13/17 Patient is here today with his wife for follow-up.  His memory seems to be worsening dramatically.  He recently met with an occupational therapist, however, who has cleared him to drive back and forth to the grocery store with his wife present in the car.  However I am concerned by his apparently worsening memory.  I performed a Mini-Mental status exam today.  The patient is able to tell me the date correctly although it takes him a little bit of time.  He is able to tell me the location quickly.  He is able to remember 3 out of 3 objects quickly however on recall he can only recall 2 out of the 3.  Patient has a difficult time performing serial sevens.  He is able to perform for correctly out of 5 however it takes him repeated prompts and multiple attempts to make the correct answer and this is a patient who has a graduate degree in mathematics.  I performed testing on pattern recognition drawing a line from a to 1, 1 to B, B to 2.  Patient was unable to recognize the pattern despite explaining to me what the pattern was and that the numbers correspond to the previous letter.  He was unable to then draw the correct line to the next letter and number in that pattern.  On clock drawing, he correctly draws a circle with the correct numbers however they are all jumbled at the end with poor spacing.  He then forgot the time I told him to draw.  All of this to me in a patient with a graduate college degree shows early short-term memory problems and slight cognitive impairment.  I believe this is primarily due to vascular dementia from his numerous strokes coupled with possible MS.  They are here today to discuss treatment options.  At that time, my plan was: I have recommended that the patient begin Aricept 5 mg a day and I would like to recheck him in 1 month  and uptitrate to 10 mg if tolerable.  I will check a CBC, CMP, vitamin B12, and TSH.  I believe the patient is suffering from a combination of vascular dementia as well as his underlying MS. I have also recommended avoiding polypharmacy and I have strongly encouraged the wife to manage his medications.  I want to limit his pain medication as much as possible.  He can no longer self administer the medication.  I have recommended that his wife hold the medication and give it to him only when he is in significant pain.  Otherwise I have encouraged Tylenol.  I believe the patient has poor impulse control and frequently may take 2 or 3 pain pills at a time when he is hurting which I believe further exacerbates his underlying cognitive impairment.  Reassess in 1 month  10/13/17 Patient is tolerating Aricept 5 mg a day.  He denies any  abnormal dreams, hallucinations, stomach upset, dizziness, or depression.  Obviously, patient nor his family can tell any difference.  Both he and his wife are arguing throughout the visit today.  His wife is essentially trying to keep him at home.  She will not let him drive a car which I agree with.  She will not let him operate a lot more.  She is not letting him go to the gym unless she accompanies him.  Although this leaves the patient feeling suffocated and stifled.  This leads to arguments and frustration which are causing significant problems in the marriage. At that time, my plan was: Increase Aricept to 10 mg a day.  I discussed with the wife allowing the patient time to be away from her.  I agree that he should not be driving a car.  However I see no serious risk in him mowing the yard at his current state.  I also believe that if she can have a family member drive him to the gym 2 to 3 days a week to allow him to get exercise this would give him an outlet to release his energy and frustration as well as provide her therapeutic break to help prevent and treat caregiver  fatigue.  12/05/17 Recently admitted with AMS.  I have copied relevant portions of the DC summary below for reference:  Admit date: 11/16/2017 Discharge date: 11/18/2017  Admitted From: home  Disposition:  home    Home Health:  ordered     Discharge Condition:  stable   CODE STATUS:  DNR   Consultations:  none    Discharge Diagnoses:  Principal Problem:   Acute encephalopathy Active Problems:   MS (multiple sclerosis) ([place])   History of stroke   Cognitive impairment   Essential hypertension       Brief Summary: [name] [name] a 77 y.o.malewith medical history significant ofdementia,multiple ischemic CVAss/p ILR placement and on eliquis, Brugada pattern on ECG, COPD, progressiveMS, non occlusive CAD.  Patient sent in to ED by wife with c/o confusion and agitation. Per wife patient has been increasingly confused, with agitation and aggression at times since Monday. No PO meds since Monday.   Hospital Course:  Acute encephalopathy - initially thought to be Wernicke's encephalopathy due to daily drinking however, no major improvement noted with Thiamine via IV but he is taking his medications appropriately - no underlying infections or abnormal blood work  - CT head and MRI noted below which are unrevealing - I have reviewed the notes from his PCP and I feel that his dementia is progressing-  - PT recommended CIR but his wife is wanting to take him home with home health to continue taking care of him  - she does state that he is getting agitated at night and also that Abilify has never helped - thus I have d/c'd the Abilify (per psych, this does need to be tapered) and started Seroquel at bedtime- she can start with 25 and give an extra dose of 25 if it is not helping but not go higher than this dose. Advised for her to take him to his PCP in about 3-4 days for a follow up.  12/05/17 Patient is here today for follow-up.  He is accompanied by  his wife.  His blood pressure has been elevated.  He called our on-call doctor and his doxazosin was increased from 2 mg a day to 4 mg a day.  However the patient has not been taking his  losartan.  Apparently he ran out of this several weeks ago.  He is also abruptly discontinued his Effexor approximately 3 weeks ago around the time he was hospitalized.  His wife states that he is back to his baseline.  She denies any further hypersomnolence or hallucinations since he has been discharged from the hospital.  He is taking Seroquel at bedtime.  He has discontinued the Abilify.  Together we reviewed his medication list at length.  He uses Requip for restless leg syndrome however I am concerned about the patient receiving a dopaminergic medication given the fact he is hallucinating at times.  I am also concerned about his polypharmacy.  Therefore I recommended discontinuing this.  I am concerned about his abrupt discontinuation of Effexor possibly exacerbating his altered mental status.  Therefore I recommended that we resume this albeit at a lower dose.  He continues to drink 2-3 hard liquor drinks a day.  He has been greatly reduced his consumption of pain medication and is using only 2 to 3 pills a week for his arthritic pain.  At that time, my planw as: The majority of our encounter today was spent discussing polypharmacy.  I recommended discontinuation of Requip given his recent hallucinations and altered mental status.  I will try to minimize the amount of centrally acting medication in this patient as I believe his dementia is worsening.  Therefore I am encouraged the patient to discontinue Requip.  I also encourage the patient to reduce his consumption of alcohol to avoid worsening his dementia and causing increased confusion.  I continue to encourage the family to decrease the use of narcotics.  We will discontinue pantoprazole as the patient does not seem to require this at the present time.  I will have him  resume his losartan 100 mg a day in addition to doxazosin 2 mg a day and metoprolol 150 mg a day and recheck his blood pressure next week to see how he is doing.  12/12/17 Since discontinuing Requip, the patient has not had any problems with restless leg.  He is not flailing in bed at night.  He is not kicking his wife.  It is not keeping him awake.  His wife states that he is slept without any difficulty.  They also have not seen any deterioration in his memory or level of consciousness.  He has had no further hallucinations.  Also he has noticed no difficulty after discontinuing Protonix.  He denies any heartburn or reflux.  His blood pressure here today is between 110 and 120/60.  I rechecked his blood pressure personally and found it to be similar.  His home blood pressures have been higher however they are still in the 130s to 789 range systolic.  Overall he is doing well with no concerns.  His wife is been able to decrease his pain medication to 2 or 3 a week.  She denies any sundowning.  She denies any delirium.  Overall he is been doing much better since leaving the hospital.  I suspect that some of his altered mental status may have been the abrupt discontinuation of venlafaxine.  Since he has resumed the medication, he has been much calmer.  Past Medical History:  Diagnosis Date  . Arthritis    s/p TKR  . Bladder cancer (HCC)    [facility], [facility] low grade papillary urothileal carcinoma  . BPH (benign prostatic hyperplasia)   . Brugada syndrome    Possible Type II Brugada ECG pattern. No family  history of SCD, no syncope, no tachypalpitations.  Marland Kitchen CAD (coronary artery disease)    a. LHC 1/15 - mid LCx 50 and 60%, proximal RCA 50%  . Chronic diastolic CHF (congestive heart failure) ([place])   . CKD (chronic kidney disease)   . COPD (chronic obstructive pulmonary disease) ([place])    Prior heavy smoker. PFTs (3/11): FVC 87%, FEV1 73%, ratio 0.57, DLCO 75%, TLC 121%. Moderate obstructive defect. PFTs  (9/15): Only minimal obstruction, sugPrior heavy smoker. PFTs (3/11): FVC 87%, FEV1 73%, ratio 0.57, DLCO 75%, TLC 121%. Moderate obstructive defect. PFTs (9/15) minimal obstruction, poss asthma component   . Depression    with bipolar tendencies  . DOE (dyspnea on exertion)    a. Myoview 8/09- EF 57%, no ischemia // b. Myoview (3/11) EF 68%, diaphragmatic attenuation, no ischemia //  c Echo (4/11) EF 09-62%, mild diastolic dysfunction, PASP 38 mmHg // d. PFTs 9/15 minimal obstruction  /  e. CT negative for ILD  . GERD (gastroesophageal reflux disease)   . History of Doppler ultrasound    Carotid US 3/11 negative for significant stenosis  //  carotid US 6/17: Mild bilateral plaque, 1-39% ICA  . History of echocardiogram    a. Echo 12/14 Inferior and distal septal HK, mild LVH, EF 50-55%, mild MR, mild LAE  //  b. Echo 6/17: Mild focal basal septal hypertrophy, EF 60-65%, normal wall motion, grade 1 diastolic dysfunction, mild AI  . HLD (hyperlipidemia)   . Hypertensive heart disease with CHF (congestive heart failure) ([place]) 04/08/2009  . Low testosterone   . Lung nodule    a. CT in 2015 >> PET in 12/15 not sugg of malignancy // CT 6/19: continue to look benign  . LVH (left ventricular hypertrophy)    a. Echo 12/14 Inferior and distal septal HK, mild LVH, EF 50-55%, mild MR, mild LAE  . Mild dementia ([place])   . Mitral regurgitation    Echo (4/11) with PISA ERO 0.3 cm^2 and regurgitant volume 41 mL (moderate MR). Echo (12/14) with mild MR.   . MS (multiple sclerosis) ([place])   . MS (multiple sclerosis) ([place])   . OSA (obstructive sleep apnea)   . Right bundle branch block   . Stroke ([place])   . Thoracic aortic aneurysm (HCC)    4.1 cm 2017 // Chest CTA 6/19: stable ascending thoracic aortic aneurysm 4.1 cm, aortic atherosclerosis, stable RML nodular densities (appears benign), emphysema >> FU 1 year  . Urinary retention with incomplete bladder emptying    receives botox injections and  treatment for BPH through [facility]   Past Surgical History:  Procedure Laterality Date  . APPENDECTOMY    . EP IMPLANTABLE DEVICE N/A 06/23/2015   Procedure: Loop Recorder Insertion;  Surgeon: Deboraha Sprang, MD;  Location: [facility];  Service: Cardiovascular;  Laterality: N/A;  . HEMORRHOID SURGERY    . LEFT HEART CATHETERIZATION WITH CORONARY ANGIOGRAM N/A 01/05/2013   Procedure: LEFT HEART CATHETERIZATION WITH CORONARY ANGIOGRAM;  Surgeon: Larey Dresser, MD;  Location: [facility] CATH LAB;  Service: Cardiovascular;  Laterality: N/A;  . ROTATOR CUFF REPAIR    . TEE WITHOUT CARDIOVERSION N/A 06/23/2015   Procedure: TRANSESOPHAGEAL ECHOCARDIOGRAM (TEE);  Surgeon: Fay Records, MD;  Location: [facility] ENDOSCOPY;  Service: Cardiovascular;  Laterality: N/A;  . TONSILLECTOMY    . TOTAL KNEE ARTHROPLASTY Right   . TRANSURETHRAL RESECTION OF PROSTATE     Current Outpatient Medications on File Prior to Visit  Medication Sig  Dispense Refill  . acetaminophen (TYLENOL) 325 MG tablet Take 2 tablets (650 mg total) by mouth every 4 (four) hours as needed for mild pain (or temp > 37.5 C (99.5 F)).    Marland Kitchen atorvastatin (LIPITOR) 40 MG tablet TAKE 1 TABLET DAILY (Patient taking differently: Take 40 mg by mouth daily. ) 90 tablet 2  . donepezil (ARICEPT) 10 MG tablet Take 1 tablet (10 mg total) by mouth at bedtime. 90 tablet 3  . doxazosin (CARDURA) 2 MG tablet Take 1 tablet (2 mg total) by mouth daily. 30 tablet 2  . ELIQUIS 5 MG TABS tablet TAKE 1 TABLET TWICE A DAY 180 tablet 3  . furosemide (LASIX) 40 MG tablet TAKE 1 TABLET(40 MG) BY MOUTH DAILY (Patient taking differently: Take 40 mg by mouth daily. ) 90 tablet 1  . gabapentin (NEURONTIN) 300 MG capsule Take 300 mg by mouth 2 (two) times daily. Patient taking 325m twice daily    . HYDROcodone-acetaminophen (NORCO) 10-325 MG tablet Take 1 tablet by mouth every 6 (six) hours as needed for moderate pain or severe pain (DX: G89.4, (G35 - MS)). 60 tablet 0  . losartan  (COZAAR) 100 MG tablet Take 1 tablet (100 mg total) by mouth daily. 30 tablet 2  . metoprolol succinate (TOPROL-XL) 100 MG 24 hr tablet Take 1 1/2 tablets (150 mg total) by mouth daily. 135 tablet 3  . Multiple Vitamin (MULTIVITAMIN WITH MINERALS) TABS tablet Take 1 tablet by mouth daily.    .Marland KitchenPROAIR HFA 108 (90 Base) MCG/ACT inhaler USE 1 TO 2 INHALATIONS EVERY 6 HOURS AS NEEDED FOR WHEEZING OR SHORTNESS OF BREATH (Patient taking differently: 1-2 puffs every 6 (six) hours as needed for wheezing or shortness of breath. ) 25.5 g 4  . QUEtiapine (SEROQUEL) 25 MG tablet TAKE 1 TO 2 TABLETS BY MOUTH AT BEDTIME 30 tablet 0  . senna-docusate (SENOKOT-S) 8.6-50 MG tablet Take 1 tablet by mouth at bedtime as needed for mild constipation.    . Tiotropium Bromide-Olodaterol (STIOLTO RESPIMAT) 2.5-2.5 MCG/ACT AERS Inhale 2 puffs into the lungs daily. 1 Inhaler 11  . venlafaxine XR (EFFEXOR-XR) 150 MG 24 hr capsule Take 1 capsule (150 mg total) by mouth daily with breakfast. 30 capsule 3   No current facility-administered medications on file prior to visit.    Allergies  Allergen Reactions  . Alcohol-Sulfur [Sulfur] Other (See Comments)    Patient was suspected of having "Brugada Syndrome"  . Bupivacaine Other (See Comments)    Patient was suspected of having "Brugada Syndrome"  . Clomipramine Hcl Other (See Comments)    Patient was suspected of having "Brugada Syndrome"  . Flecainide Other (See Comments)    Patient was suspected of having "Brugada Syndrome"  . Lithium Other (See Comments)    Patient was suspected of having "Brugada Syndrome"  . Acetylcholine     Patient was suspected of having "Brugada Syndrome": (BrS) is a genetic condition that results in abnormal electrical activity within the heart, increasing the risk of sudden cardiac death. Those affected may have episodes of passing out.  . Amitriptyline Other (See Comments)    Patient was suspected of having "Brugada Syndrome"  . Cocaine  Other (See Comments)    Patient was suspected of having "Brugada Syndrome"  . Desipramine Other (See Comments)    Patient was suspected of having "Brugada Syndrome"  . Ergonovine Other (See Comments)    Patient was suspected of having "Brugada Syndrome"  . Loxapine Other (See Comments)  Patient was suspected of having "Brugada Syndrome"  . Nortriptyline Other (See Comments)    Patient was suspected of having "Brugada Syndrome"  . Oxcarbazepine Other (See Comments)    Patient was suspected of having "Brugada Syndrome"  . Procainamide Other (See Comments)    Patient was suspected of having "Brugada Syndrome"  . Procaine Other (See Comments)    Patient was suspected of having "Brugada Syndrome"  . Propafenone Other (See Comments)    Patient was suspected of having "Brugada Syndrome"  . Propofol Other (See Comments)    Patient was suspected of having "Brugada Syndrome"  . Trifluoperazine Other (See Comments)    Patient was suspected of having "Brugada Syndrome"   Social History   Socioeconomic History  . Marital status: Married    Spouse name: Not on file  . Number of children: Not on file  . Years of education: Not on file  . Highest education level: Not on file  Occupational History  . Occupation: retired    Fish farm manager: RETIRED  Social Needs  . Financial resource strain: Not on file  . Food insecurity:    Worry: Not on file    Inability: Not on file  . Transportation needs:    Medical: Not on file    Non-medical: Not on file  Tobacco Use  . Smoking status: Former Smoker    Packs/day: 2.00    Years: 40.00    Pack years: 80.00    Types: Cigarettes    Last attempt to quit: 01/04/1993    Years since quitting: 24.9  . Smokeless tobacco: Former Systems developer    Types: Chew  Substance and Sexual Activity  . Alcohol use: Yes    Alcohol/week: 25.0 standard drinks    Types: 25 Cans of beer per week    Comment: 12 pack a week  . Drug use: No  . Sexual activity: Not on file   Lifestyle  . Physical activity:    Days per week: Not on file    Minutes per session: Not on file  . Stress: Not on file  Relationships  . Social connections:    Talks on phone: Not on file    Gets together: Not on file    Attends religious service: Not on file    Active member of club or organization: Not on file    Attends meetings of clubs or organizations: Not on file    Relationship status: Not on file  . Intimate partner violence:    Fear of current or ex partner: Not on file    Emotionally abused: Not on file    Physically abused: Not on file    Forced sexual activity: Not on file  Other Topics Concern  . Not on file  Social History Narrative   Retired from the [employer] after 20 years   [place] Veteran      Review of Systems  All other systems reviewed and are negative.      Objective:   Physical Exam  Constitutional: He is oriented to person, place, and time. He appears well-developed and well-nourished. No distress.  Cardiovascular: Normal rate, regular rhythm, normal heart sounds and intact distal pulses.  No murmur heard. Pulmonary/Chest: Effort normal and breath sounds normal. No respiratory distress. He has no wheezes. He has no rales.  Neurological: He is alert and oriented to person, place, and time. He has normal reflexes. No cranial nerve deficit. He exhibits normal muscle tone. Coordination normal.  Skin: He is not  diaphoretic.  Psychiatric: He has a normal mood and affect. His behavior is normal. Judgment and thought content normal.  Vitals reviewed.         Assessment & Plan:  Essential hypertension Blood pressure today is excellent.  Continue losartan, metoprolol, and Cardura at their current doses.  I have asked the patient to continue to refrain from using Requip and Protonix.  I have recommended that we continue to try to limit and minimize any polypharmacy.  Reassess the patient in 3 months or as needed as problems arise.

## 2017-12-13 DIAGNOSIS — I5032 Chronic diastolic (congestive) heart failure: Secondary | ICD-10-CM | POA: Diagnosis not present

## 2017-12-13 DIAGNOSIS — G35 Multiple sclerosis: Secondary | ICD-10-CM | POA: Diagnosis not present

## 2017-12-13 DIAGNOSIS — I2510 Atherosclerotic heart disease of native coronary artery without angina pectoris: Secondary | ICD-10-CM | POA: Diagnosis not present

## 2017-12-13 DIAGNOSIS — G3184 Mild cognitive impairment, so stated: Secondary | ICD-10-CM | POA: Diagnosis not present

## 2017-12-13 DIAGNOSIS — I498 Other specified cardiac arrhythmias: Secondary | ICD-10-CM | POA: Diagnosis not present

## 2017-12-13 DIAGNOSIS — F0391 Unspecified dementia with behavioral disturbance: Secondary | ICD-10-CM | POA: Diagnosis not present

## 2017-12-14 NOTE — Progress Notes (Signed)
Carelink Summary Report / Loop Recorder 

## 2017-12-15 DIAGNOSIS — G3184 Mild cognitive impairment, so stated: Secondary | ICD-10-CM | POA: Diagnosis not present

## 2017-12-15 DIAGNOSIS — G35 Multiple sclerosis: Secondary | ICD-10-CM | POA: Diagnosis not present

## 2017-12-15 DIAGNOSIS — I5032 Chronic diastolic (congestive) heart failure: Secondary | ICD-10-CM | POA: Diagnosis not present

## 2017-12-15 DIAGNOSIS — I498 Other specified cardiac arrhythmias: Secondary | ICD-10-CM | POA: Diagnosis not present

## 2017-12-15 DIAGNOSIS — I2510 Atherosclerotic heart disease of native coronary artery without angina pectoris: Secondary | ICD-10-CM | POA: Diagnosis not present

## 2017-12-15 DIAGNOSIS — F0391 Unspecified dementia with behavioral disturbance: Secondary | ICD-10-CM | POA: Diagnosis not present

## 2017-12-22 DIAGNOSIS — G3184 Mild cognitive impairment, so stated: Secondary | ICD-10-CM | POA: Diagnosis not present

## 2017-12-22 DIAGNOSIS — I5032 Chronic diastolic (congestive) heart failure: Secondary | ICD-10-CM | POA: Diagnosis not present

## 2017-12-22 DIAGNOSIS — G35 Multiple sclerosis: Secondary | ICD-10-CM | POA: Diagnosis not present

## 2017-12-22 DIAGNOSIS — F0391 Unspecified dementia with behavioral disturbance: Secondary | ICD-10-CM | POA: Diagnosis not present

## 2017-12-22 DIAGNOSIS — I498 Other specified cardiac arrhythmias: Secondary | ICD-10-CM | POA: Diagnosis not present

## 2017-12-22 DIAGNOSIS — I2510 Atherosclerotic heart disease of native coronary artery without angina pectoris: Secondary | ICD-10-CM | POA: Diagnosis not present

## 2017-12-30 ENCOUNTER — Other Ambulatory Visit: Payer: Self-pay | Admitting: Family Medicine

## 2017-12-30 MED ORDER — DOXAZOSIN MESYLATE 2 MG PO TABS: 4.0000 mg | ORAL_TABLET | Freq: Every day | ORAL | 2 refills | Status: DC

## 2017-12-30 MED ORDER — HYDROCODONE-ACETAMINOPHEN 10-325 MG PO TABS: 1.0000 | ORAL_TABLET | Freq: Four times a day (QID) | ORAL | 0 refills | Status: DC | PRN

## 2017-12-31 LAB — CUP PACEART REMOTE DEVICE CHECK
MDC IDC PG IMPLANT DT: 20170619
MDC IDC SESS DTM: 20191106210940

## 2018-01-05 ENCOUNTER — Other Ambulatory Visit: Payer: Self-pay | Admitting: Internal Medicine

## 2018-01-06 ENCOUNTER — Ambulatory Visit (INDEPENDENT_AMBULATORY_CARE_PROVIDER_SITE_OTHER): Payer: Medicare Other | Admitting: Family Medicine

## 2018-01-06 ENCOUNTER — Encounter: Payer: Self-pay | Admitting: Family Medicine

## 2018-01-06 VITALS — BP 150/70 | HR 110 | Temp 97.8°F | Resp 20 | Ht 71.0 in | Wt 240.0 lb

## 2018-01-06 DIAGNOSIS — G2571 Drug induced akathisia: Secondary | ICD-10-CM

## 2018-01-06 DIAGNOSIS — G249 Dystonia, unspecified: Secondary | ICD-10-CM

## 2018-01-06 DIAGNOSIS — I10 Essential (primary) hypertension: Secondary | ICD-10-CM

## 2018-01-06 DIAGNOSIS — R000 Tachycardia, unspecified: Secondary | ICD-10-CM | POA: Diagnosis not present

## 2018-01-06 NOTE — Progress Notes (Signed)
Subjective:    Patient ID: [name] [name], male    DOB: [birth year], 78 y.o.   MRN: [id number]  HPI  10/12/16 Wife brings him patient for evaluation to determine should he still be driving. Past medical history is significant for progressive multiple sclerosis, multiple ischemic strokes, chronic osteoarthritic pain on 3 time a day hydrocodone WHICH contribute to this discussion. Physically, the patient has muscle strength 5 over 5 equal and symmetric in the arms and in the legs with normal reflexes. His physical reaction time is normal to catching a dropped object. Cerebellar exam is normal today however on Mini-Mental status exam, the patient tells me that the year is 1983. He is unable to correctly identify the month. He can #2 out of 3 objects on recall. He scores 3 out of 5 on world spelling in reverse. He is unable to perform serial sevens. It takes him 25 seconds to draw a clock although he does draw correctly. However he is unable to recognize patterns or to correctly copy intersecting pentagons.  At that time, my plan was: Patient has age-related memory loss compounded by multiple ischemic strokes, compounded by progressive multiple sclerosis, compounded by polypharmacy and chronic narcotic use all of which makes him a danger to drive. In general, I recommended that the patient not drive. Legally, I believe he would still qualify for restricted status. He must drive less than 45 miles an hour. He must avoid Interstate driving or driving on highways and remain driving only on 2 lane roads where there is less travel. I would not recommend driving at night or during conditions that would compromise his reaction time. After discussing these restrictions I explained to the patient and his wife that if he were my father, I would not recommend driving.  75/91/63 Patient's wife removed his car and had it placed at her daughter's home. This angered the patient dramatically. Furthermore, the patient's  wife is concerned that he is extremely depressed and that he may harm himself. She also feels threatened and the home due to his outbursts of uncontrolled anger. Therefore she had her daughter and son-in-law will remove the guns from his home. Patient feels extremely upset by this. He feels disrespected. He feels violated. Patient's wife states that he is extremely angry. His mood swings are rapid and fluctuating. He is extremely depressed. He reports anhedonia. He is frustrated by his memory loss. He is frustrated by his inability to drive. He is frustrated by having his personal items removed from his home without his consent. He feels like he is losing his independence and his masculinity. In the past he has had symptoms of depression with bipolar tendencies. At one time we had him on Depakote as a mood stabilizer which seemed to help dramatically. He is also taking Abilify but this was discontinued due to weight gain. Patient's wife feels unsafe at home. Patient denies suicidality. Patient denies any homicidal ideation. He states that his wife is blowing things out of proportion.  At that time, my plan was: I feel both the patient and his wife have valid arguments. The wife does not feel safe with him having guns in the home nor does she feel safe with him driving. Patient is frustrated by his loss of independence and the perceived disrespect of having his guns and car taken from him without his consent. Wife states that she did not consent the patient because she was afraid of his uncontrolled anger when she broached the subject with  him couple of his memory loss and poor judgment and impulsivity. Therefore I will treat the patient with Abilify 5 mg by mouth daily as a mood stabilizer and management for depression and recheck the patient in 2 weeks. I have agreed that he should not be driving. I have also agreed that there should not be loaded weapons in the home. However I agreed that the patient should be  asked and talked to is a human being and that showing him disrespect may help mollify some of his anger and frustration. Patient and wife agree to try  09/13/17 Patient is here today with his wife for follow-up.  His memory seems to be worsening dramatically.  He recently met with an occupational therapist, however, who has cleared him to drive back and forth to the grocery store with his wife present in the car.  However I am concerned by his apparently worsening memory.  I performed a Mini-Mental status exam today.  The patient is able to tell me the date correctly although it takes him a little bit of time.  He is able to tell me the location quickly.  He is able to remember 3 out of 3 objects quickly however on recall he can only recall 2 out of the 3.  Patient has a difficult time performing serial sevens.  He is able to perform for correctly out of 5 however it takes him repeated prompts and multiple attempts to make the correct answer and this is a patient who has a graduate degree in mathematics.  I performed testing on pattern recognition drawing a line from a to 1, 1 to B, B to 2.  Patient was unable to recognize the pattern despite explaining to me what the pattern was and that the numbers correspond to the previous letter.  He was unable to then draw the correct line to the next letter and number in that pattern.  On clock drawing, he correctly draws a circle with the correct numbers however they are all jumbled at the end with poor spacing.  He then forgot the time I told him to draw.  All of this to me in a patient with a graduate college degree shows early short-term memory problems and slight cognitive impairment.  I believe this is primarily due to vascular dementia from his numerous strokes coupled with possible MS.  They are here today to discuss treatment options.  At that time, my plan was: I have recommended that the patient begin Aricept 5 mg a day and I would like to recheck him in 1 month  and uptitrate to 10 mg if tolerable.  I will check a CBC, CMP, vitamin B12, and TSH.  I believe the patient is suffering from a combination of vascular dementia as well as his underlying MS. I have also recommended avoiding polypharmacy and I have strongly encouraged the wife to manage his medications.  I want to limit his pain medication as much as possible.  He can no longer self administer the medication.  I have recommended that his wife hold the medication and give it to him only when he is in significant pain.  Otherwise I have encouraged Tylenol.  I believe the patient has poor impulse control and frequently may take 2 or 3 pain pills at a time when he is hurting which I believe further exacerbates his underlying cognitive impairment.  Reassess in 1 month  10/13/17 Patient is tolerating Aricept 5 mg a day.  He denies any  abnormal dreams, hallucinations, stomach upset, dizziness, or depression.  Obviously, patient nor his family can tell any difference.  Both he and his wife are arguing throughout the visit today.  His wife is essentially trying to keep him at home.  She will not let him drive a car which I agree with.  She will not let him operate a lot more.  She is not letting him go to the gym unless she accompanies him.  Although this leaves the patient feeling suffocated and stifled.  This leads to arguments and frustration which are causing significant problems in the marriage. At that time, my plan was: Increase Aricept to 10 mg a day.  I discussed with the wife allowing the patient time to be away from her.  I agree that he should not be driving a car.  However I see no serious risk in him mowing the yard at his current state.  I also believe that if she can have a family member drive him to the gym 2 to 3 days a week to allow him to get exercise this would give him an outlet to release his energy and frustration as well as provide her therapeutic break to help prevent and treat caregiver  fatigue.  12/05/17 Recently admitted with AMS.  I have copied relevant portions of the DC summary below for reference:  Admit date: 11/16/2017 Discharge date: 11/18/2017  Admitted From: home  Disposition:  home    Home Health:  ordered     Discharge Condition:  stable   CODE STATUS:  DNR   Consultations:  none    Discharge Diagnoses:  Principal Problem:   Acute encephalopathy Active Problems:   MS (multiple sclerosis) ([place])   History of stroke   Cognitive impairment   Essential hypertension       Brief Summary: [name] [name] a 78 y.o.malewith medical history significant ofdementia,multiple ischemic CVAss/p ILR placement and on eliquis, Brugada pattern on ECG, COPD, progressiveMS, non occlusive CAD.  Patient sent in to ED by wife with c/o confusion and agitation. Per wife patient has been increasingly confused, with agitation and aggression at times since Monday. No PO meds since Monday.   Hospital Course:  Acute encephalopathy - initially thought to be Wernicke's encephalopathy due to daily drinking however, no major improvement noted with Thiamine via IV but he is taking his medications appropriately - no underlying infections or abnormal blood work  - CT head and MRI noted below which are unrevealing - I have reviewed the notes from his PCP and I feel that his dementia is progressing-  - PT recommended CIR but his wife is wanting to take him home with home health to continue taking care of him  - she does state that he is getting agitated at night and also that Abilify has never helped - thus I have d/c'd the Abilify (per psych, this does need to be tapered) and started Seroquel at bedtime- she can start with 25 and give an extra dose of 25 if it is not helping but not go higher than this dose. Advised for her to take him to his PCP in about 3-4 days for a follow up.  12/05/17 Patient is here today for follow-up.  He is accompanied by  his wife.  His blood pressure has been elevated.  He called our on-call doctor and his doxazosin was increased from 2 mg a day to 4 mg a day.  However the patient has not been taking his  losartan.  Apparently he ran out of this several weeks ago.  He is also abruptly discontinued his Effexor approximately 3 weeks ago around the time he was hospitalized.  His wife states that he is back to his baseline.  She denies any further hypersomnolence or hallucinations since he has been discharged from the hospital.  He is taking Seroquel at bedtime.  He has discontinued the Abilify.  Together we reviewed his medication list at length.  He uses Requip for restless leg syndrome however I am concerned about the patient receiving a dopaminergic medication given the fact he is hallucinating at times.  I am also concerned about his polypharmacy.  Therefore I recommended discontinuing this.  I am concerned about his abrupt discontinuation of Effexor possibly exacerbating his altered mental status.  Therefore I recommended that we resume this albeit at a lower dose.  He continues to drink 2-3 hard liquor drinks a day.  He has been greatly reduced his consumption of pain medication and is using only 2 to 3 pills a week for his arthritic pain.  At that time, my planw as: The majority of our encounter today was spent discussing polypharmacy.  I recommended discontinuation of Requip given his recent hallucinations and altered mental status.  I will try to minimize the amount of centrally acting medication in this patient as I believe his dementia is worsening.  Therefore I am encouraged the patient to discontinue Requip.  I also encourage the patient to reduce his consumption of alcohol to avoid worsening his dementia and causing increased confusion.  I continue to encourage the family to decrease the use of narcotics.  We will discontinue pantoprazole as the patient does not seem to require this at the present time.  I will have him  resume his losartan 100 mg a day in addition to doxazosin 2 mg a day and metoprolol 150 mg a day and recheck his blood pressure next week to see how he is doing.  12/12/17 Since discontinuing Requip, the patient has not had any problems with restless leg.  He is not flailing in bed at night.  He is not kicking his wife.  It is not keeping him awake.  His wife states that he is slept without any difficulty.  They also have not seen any deterioration in his memory or level of consciousness.  He has had no further hallucinations.  Also he has noticed no difficulty after discontinuing Protonix.  He denies any heartburn or reflux.  His blood pressure here today is between 110 and 120/60.  I rechecked his blood pressure personally and found it to be similar.  His home blood pressures have been higher however they are still in the 130s to 341 range systolic.  Overall he is doing well with no concerns.  His wife is been able to decrease his pain medication to 2 or 3 a week.  She denies any sundowning.  She denies any delirium.  Overall he is been doing much better since leaving the hospital.  I suspect that some of his altered mental status may have been the abrupt discontinuation of venlafaxine.  Since he has resumed the medication, he has been much calmer.  At that time, my plan was: Blood pressure today is excellent.  Continue losartan, metoprolol, and Cardura at their current doses.  I have asked the patient to continue to refrain from using Requip and Protonix.  I have recommended that we continue to try to limit and minimize any polypharmacy.  Reassess the patient in 3 months or as needed as problems arise.  01/06/18 Patient's blood pressure has been elevated at home.  Blood pressure has been ranging between 224 and 825 systolic over 00-37 diastolic.  He is only been taking Cardura 2 mg.  Wife is concerned that we need to increase his Effexor because he is becoming more anxious.  On exam today, patient  demonstrates some tardive dyskinesia.  He is also demonstrating some motor tics.  He is grunting frequently for no reason.  He is also making unusual facial grimaces at times and seems to demonstrate akathisia as he is sitting.  He is rocking from side to side in a sinuous motion.  Today on his exam, he also has tachycardia.  Blood pressure is elevated at 150/70.  Past Medical History:  Diagnosis Date  . Arthritis    s/p TKR  . Bladder cancer (HCC)    [facility], [facility] low grade papillary urothileal carcinoma  . BPH (benign prostatic hyperplasia)   . Brugada syndrome    Possible Type II Brugada ECG pattern. No family history of SCD, no syncope, no tachypalpitations.  Marland Kitchen CAD (coronary artery disease)    a. LHC 1/15 - mid LCx 50 and 60%, proximal RCA 50%  . Chronic diastolic CHF (congestive heart failure) ([place])   . CKD (chronic kidney disease)   . COPD (chronic obstructive pulmonary disease) ([place])    Prior heavy smoker. PFTs (3/11): FVC 87%, FEV1 73%, ratio 0.57, DLCO 75%, TLC 121%. Moderate obstructive defect. PFTs (9/15): Only minimal obstruction, sugPrior heavy smoker. PFTs (3/11): FVC 87%, FEV1 73%, ratio 0.57, DLCO 75%, TLC 121%. Moderate obstructive defect. PFTs (9/15) minimal obstruction, poss asthma component   . Depression    with bipolar tendencies  . DOE (dyspnea on exertion)    a. Myoview 8/09- EF 57%, no ischemia // b. Myoview (3/11) EF 68%, diaphragmatic attenuation, no ischemia //  c Echo (4/11) EF 04-88%, mild diastolic dysfunction, PASP 38 mmHg // d. PFTs 9/15 minimal obstruction  /  e. CT negative for ILD  . GERD (gastroesophageal reflux disease)   . History of Doppler ultrasound    Carotid US 3/11 negative for significant stenosis  //  carotid US 6/17: Mild bilateral plaque, 1-39% ICA  . History of echocardiogram    a. Echo 12/14 Inferior and distal septal HK, mild LVH, EF 50-55%, mild MR, mild LAE  //  b. Echo 6/17: Mild focal basal septal hypertrophy, EF 60-65%, normal wall motion,  grade 1 diastolic dysfunction, mild AI  . HLD (hyperlipidemia)   . Hypertensive heart disease with CHF (congestive heart failure) ([place]) 04/08/2009  . Low testosterone   . Lung nodule    a. CT in 2015 >> PET in 12/15 not sugg of malignancy // CT 6/19: continue to look benign  . LVH (left ventricular hypertrophy)    a. Echo 12/14 Inferior and distal septal HK, mild LVH, EF 50-55%, mild MR, mild LAE  . Mild dementia ([place])   . Mitral regurgitation    Echo (4/11) with PISA ERO 0.3 cm^2 and regurgitant volume 41 mL (moderate MR). Echo (12/14) with mild MR.   . MS (multiple sclerosis) ([place])   . MS (multiple sclerosis) ([place])   . OSA (obstructive sleep apnea)   . Right bundle branch block   . Stroke ([place])   . Thoracic aortic aneurysm (HCC)    4.1 cm 2017 // Chest CTA 6/19: stable ascending thoracic aortic aneurysm 4.1 cm, aortic atherosclerosis, stable  RML nodular densities (appears benign), emphysema >> FU 1 year  . Urinary retention with incomplete bladder emptying    receives botox injections and treatment for BPH through [facility]   Past Surgical History:  Procedure Laterality Date  . APPENDECTOMY    . EP IMPLANTABLE DEVICE N/A 06/23/2015   Procedure: Loop Recorder Insertion;  Surgeon: Deboraha Sprang, MD;  Location: [facility];  Service: Cardiovascular;  Laterality: N/A;  . HEMORRHOID SURGERY    . LEFT HEART CATHETERIZATION WITH CORONARY ANGIOGRAM N/A 01/05/2013   Procedure: LEFT HEART CATHETERIZATION WITH CORONARY ANGIOGRAM;  Surgeon: Larey Dresser, MD;  Location[facility] C[facility] LAB;  Service: Cardiovascular;  Laterality: N/A;  . ROTATOR CUFF REPAIR    . TEE WITHOUT CARDIOVERSION N/A 06/23/2015   Procedure: TRANSESOPHAGEAL ECHOCARDIOGRAM (TEE);  Surgeon: Fay Records, MD;  Location: [facility] ENDOSCOPY;  Service: Cardiovascular;  Laterality: N/A;  . TONSILLECTOMY    . TOTAL KNEE ARTHROPLASTY Right   . TRANSURETHRAL RESECTION OF PROSTATE     Current Outpatient Medications on File Prior to Visit   Medication Sig Dispense Refill  . acetaminophen (TYLENOL) 325 MG tablet Take 2 tablets (650 mg total) by mouth every 4 (four) hours as needed for mild pain (or temp > 37.5 C (99.5 F)).    Marland Kitchen atorvastatin (LIPITOR) 40 MG tablet TAKE 1 TABLET DAILY (Patient taking differently: Take 40 mg by mouth daily. ) 90 tablet 2  . donepezil (ARICEPT) 10 MG tablet Take 1 tablet (10 mg total) by mouth at bedtime. 90 tablet 3  . doxazosin (CARDURA) 2 MG tablet Take 2 tablets (4 mg total) by mouth daily. 60 tablet 2  . ELIQUIS 5 MG TABS tablet TAKE 1 TABLET TWICE A DAY 180 tablet 3  . furosemide (LASIX) 40 MG tablet TAKE 1 TABLET(40 MG) BY MOUTH DAILY (Patient taking differently: Take 40 mg by mouth daily. ) 90 tablet 1  . gabapentin (NEURONTIN) 300 MG capsule Take 300 mg by mouth 2 (two) times daily. Patient taking 333m twice daily    . HYDROcodone-acetaminophen (NORCO) 10-325 MG tablet Take 1 tablet by mouth every 6 (six) hours as needed for moderate pain or severe pain (DX: G89.4, (G35 - MS)). 60 tablet 0  . losartan (COZAAR) 100 MG tablet Take 1 tablet (100 mg total) by mouth daily. 30 tablet 2  . metoprolol succinate (TOPROL-XL) 100 MG 24 hr tablet Take 1 1/2 tablets (150 mg total) by mouth daily. 135 tablet 3  . Multiple Vitamin (MULTIVITAMIN WITH MINERALS) TABS tablet Take 1 tablet by mouth daily.    .Marland KitchenPROAIR HFA 108 (90 Base) MCG/ACT inhaler USE 1 TO 2 INHALATIONS EVERY 6 HOURS AS NEEDED FOR WHEEZING OR SHORTNESS OF BREATH (Patient taking differently: 1-2 puffs every 6 (six) hours as needed for wheezing or shortness of breath. ) 25.5 g 4  . QUEtiapine (SEROQUEL) 25 MG tablet TAKE 1 TO 2 TABLETS BY MOUTH AT BEDTIME 30 tablet 0  . senna-docusate (SENOKOT-S) 8.6-50 MG tablet Take 1 tablet by mouth at bedtime as needed for mild constipation.    . Tiotropium Bromide-Olodaterol (STIOLTO RESPIMAT) 2.5-2.5 MCG/ACT AERS Inhale 2 puffs into the lungs daily. 1 Inhaler 11  . venlafaxine XR (EFFEXOR-XR) 150 MG 24 hr  capsule Take 1 capsule (150 mg total) by mouth daily with breakfast. 30 capsule 3   No current facility-administered medications on file prior to visit.    Allergies  Allergen Reactions  . Alcohol-Sulfur [Sulfur] Other (See Comments)  Patient was suspected of having "Brugada Syndrome"  . Bupivacaine Other (See Comments)    Patient was suspected of having "Brugada Syndrome"  . Clomipramine Hcl Other (See Comments)    Patient was suspected of having "Brugada Syndrome"  . Flecainide Other (See Comments)    Patient was suspected of having "Brugada Syndrome"  . Lithium Other (See Comments)    Patient was suspected of having "Brugada Syndrome"  . Acetylcholine     Patient was suspected of having "Brugada Syndrome": (BrS) is a genetic condition that results in abnormal electrical activity within the heart, increasing the risk of sudden cardiac death. Those affected may have episodes of passing out.  . Amitriptyline Other (See Comments)    Patient was suspected of having "Brugada Syndrome"  . Cocaine Other (See Comments)    Patient was suspected of having "Brugada Syndrome"  . Desipramine Other (See Comments)    Patient was suspected of having "Brugada Syndrome"  . Ergonovine Other (See Comments)    Patient was suspected of having "Brugada Syndrome"  . Loxapine Other (See Comments)    Patient was suspected of having "Brugada Syndrome"  . Nortriptyline Other (See Comments)    Patient was suspected of having "Brugada Syndrome"  . Oxcarbazepine Other (See Comments)    Patient was suspected of having "Brugada Syndrome"  . Procainamide Other (See Comments)    Patient was suspected of having "Brugada Syndrome"  . Procaine Other (See Comments)    Patient was suspected of having "Brugada Syndrome"  . Propafenone Other (See Comments)    Patient was suspected of having "Brugada Syndrome"  . Propofol Other (See Comments)    Patient was suspected of having "Brugada Syndrome"  . Trifluoperazine  Other (See Comments)    Patient was suspected of having "Brugada Syndrome"   Social History   Socioeconomic History  . Marital status: Married    Spouse name: Not on file  . Number of children: Not on file  . Years of education: Not on file  . Highest education level: Not on file  Occupational History  . Occupation: retired    Fish farm manager: RETIRED  Social Needs  . Financial resource strain: Not on file  . Food insecurity:    Worry: Not on file    Inability: Not on file  . Transportation needs:    Medical: Not on file    Non-medical: Not on file  Tobacco Use  . Smoking status: Former Smoker    Packs/day: 2.00    Years: 40.00    Pack years: 80.00    Types: Cigarettes    Last attempt to quit: 01/04/1993    Years since quitting: 25.0  . Smokeless tobacco: Former Systems developer    Types: Chew  Substance and Sexual Activity  . Alcohol use: Yes    Alcohol/week: 25.0 standard drinks    Types: 25 Cans of beer per week    Comment: 12 pack a week  . Drug use: No  . Sexual activity: Not on file  Lifestyle  . Physical activity:    Days per week: Not on file    Minutes per session: Not on file  . Stress: Not on file  Relationships  . Social connections:    Talks on phone: Not on file    Gets together: Not on file    Attends religious service: Not on file    Active member of club or organization: Not on file    Attends meetings of clubs or organizations: Not on  file    Relationship status: Not on file  . Intimate partner violence:    Fear of current or ex partner: Not on file    Emotionally abused: Not on file    Physically abused: Not on file    Forced sexual activity: Not on file  Other Topics Concern  . Not on file  Social History Narrative   Retired from the [employer] after 20 years   [place] Veteran      Review of Systems  All other systems reviewed and are negative.      Objective:   Physical Exam  Constitutional: He is oriented to person, place, and time. He  appears well-developed and well-nourished. No distress.  Cardiovascular: Normal rate, regular rhythm, normal heart sounds and intact distal pulses.  No murmur heard. Pulmonary/Chest: Effort normal and breath sounds normal. No respiratory distress. He has no wheezes. He has no rales.  Neurological: He is alert and oriented to person, place, and time. He has normal reflexes. No cranial nerve deficit. He exhibits normal muscle tone. He displays no seizure activity. Coordination normal.  Skin: He is not diaphoretic.  Psychiatric: He has a normal mood and affect. His behavior is normal. Judgment and thought content normal.  Vitals reviewed.  See hpi        Assessment & Plan:  Tachycardia - Plan: EKG 12-Lead  Akathisia  Dyskinesia  Essential hypertension  I question if the akathisia, tardive dyskinesia is related to the Seroquel.  I recommended discontinuation of this.  I would not increase venlafaxine at the present time but would rather wait to see the patient's response to holding the Seroquel.  Meanwhile I would increase the Cardura to 4 mg a day because of his elevated blood pressure.  Given the tachycardia I will obtain an EKG.  EKG today shows normal sinus rhythm with a heart rate of 99 bpm.  There is no evidence of ischemia or infarction.  I recommended that the patient take an extra 50 mg of metoprolol today and then I have asked his wife to monitor his heart rate more closely at home and if greater than 90 on a consistent basis we may need to permanently increase his Toprol-XL to 150 mg a day

## 2018-01-16 ENCOUNTER — Ambulatory Visit (INDEPENDENT_AMBULATORY_CARE_PROVIDER_SITE_OTHER): Payer: Medicare Other

## 2018-01-16 DIAGNOSIS — I639 Cerebral infarction, unspecified: Secondary | ICD-10-CM | POA: Diagnosis not present

## 2018-01-17 NOTE — Progress Notes (Signed)
Carelink Summary Report / Loop Recorder 

## 2018-01-19 LAB — CUP PACEART REMOTE DEVICE CHECK
Date Time Interrogation Session: 20200111214143
Implantable Pulse Generator Implant Date: 20170619

## 2018-01-22 ENCOUNTER — Encounter: Payer: Self-pay | Admitting: Family Medicine

## 2018-01-22 LAB — CUP PACEART REMOTE DEVICE CHECK
Date Time Interrogation Session: 20191209211044
MDC IDC PG IMPLANT DT: 20170619

## 2018-01-26 ENCOUNTER — Encounter: Payer: Self-pay | Admitting: Family Medicine

## 2018-01-26 ENCOUNTER — Other Ambulatory Visit: Payer: Self-pay | Admitting: Family Medicine

## 2018-01-26 MED ORDER — ROPINIROLE HCL 1 MG PO TABS: 1.0000 mg | ORAL_TABLET | Freq: Every day | ORAL | 3 refills | Status: DC

## 2018-01-31 ENCOUNTER — Encounter: Payer: Self-pay | Admitting: Family Medicine

## 2018-02-01 ENCOUNTER — Other Ambulatory Visit: Payer: Self-pay | Admitting: Family Medicine

## 2018-02-01 NOTE — Telephone Encounter (Signed)
Patient is requesting a refill on Hydrocodone   LOV: 01/06/2018  LRF:  12/30/17

## 2018-02-02 MED ORDER — HYDROCODONE-ACETAMINOPHEN 10-325 MG PO TABS: 1.0000 | ORAL_TABLET | Freq: Four times a day (QID) | ORAL | 0 refills | Status: DC | PRN

## 2018-03-07 ENCOUNTER — Other Ambulatory Visit: Payer: Self-pay | Admitting: Family Medicine

## 2018-03-07 ENCOUNTER — Telehealth: Payer: Self-pay | Admitting: Family Medicine

## 2018-03-07 NOTE — Telephone Encounter (Signed)
Refill on doxazosin to [facility] [address] ° °Please change [facility] they will no longer be using mail order.  °

## 2018-03-07 NOTE — Telephone Encounter (Signed)
Medication called/sent to requested pharmacy  

## 2018-03-10 ENCOUNTER — Encounter: Payer: Self-pay | Admitting: Family Medicine

## 2018-03-10 ENCOUNTER — Other Ambulatory Visit: Payer: Self-pay | Admitting: Family Medicine

## 2018-03-10 ENCOUNTER — Ambulatory Visit (INDEPENDENT_AMBULATORY_CARE_PROVIDER_SITE_OTHER): Payer: Medicare Other | Admitting: Family Medicine

## 2018-03-10 VITALS — BP 140/76 | HR 84 | Temp 97.8°F | Resp 14 | Ht 71.0 in | Wt 227.0 lb

## 2018-03-10 DIAGNOSIS — R4189 Other symptoms and signs involving cognitive functions and awareness: Secondary | ICD-10-CM

## 2018-03-10 DIAGNOSIS — I639 Cerebral infarction, unspecified: Secondary | ICD-10-CM

## 2018-03-10 DIAGNOSIS — R4586 Emotional lability: Secondary | ICD-10-CM | POA: Diagnosis not present

## 2018-03-10 DIAGNOSIS — R413 Other amnesia: Secondary | ICD-10-CM | POA: Diagnosis not present

## 2018-03-10 MED ORDER — VENLAFAXINE HCL ER 225 MG PO TB24: 225.0000 mg | ORAL_TABLET | Freq: Every morning | ORAL | 3 refills | Status: DC

## 2018-03-10 NOTE — Telephone Encounter (Signed)
Refill on hydrocodone to [facility] [place]

## 2018-03-10 NOTE — Telephone Encounter (Signed)
Patient is requesting a refill on Hydrocodone   LOV: 03/10/18  LRF:   02/02/18

## 2018-03-10 NOTE — Progress Notes (Signed)
Subjective:    Patient ID: [name] [name], male    DOB: [birth year], 78 y.o.   MRN: [id number]  HPI  10/12/16 Wife brings him patient for evaluation to determine should he still be driving. Past medical history is significant for progressive multiple sclerosis, multiple ischemic strokes, chronic osteoarthritic pain on 3 time a day hydrocodone WHICH contribute to this discussion. Physically, the patient has muscle strength 5 over 5 equal and symmetric in the arms and in the legs with normal reflexes. His physical reaction time is normal to catching a dropped object. Cerebellar exam is normal today however on Mini-Mental status exam, the patient tells me that the year is 1983. He is unable to correctly identify the month. He can #2 out of 3 objects on recall. He scores 3 out of 5 on world spelling in reverse. He is unable to perform serial sevens. It takes him 25 seconds to draw a clock although he does draw correctly. However he is unable to recognize patterns or to correctly copy intersecting pentagons.  At that time, my plan was: Patient has age-related memory loss compounded by multiple ischemic strokes, compounded by progressive multiple sclerosis, compounded by polypharmacy and chronic narcotic use all of which makes him a danger to drive. In general, I recommended that the patient not drive. Legally, I believe he would still qualify for restricted status. He must drive less than 45 miles an hour. He must avoid Interstate driving or driving on highways and remain driving only on 2 lane roads where there is less travel. I would not recommend driving at night or during conditions that would compromise his reaction time. After discussing these restrictions I explained to the patient and his wife that if he were my father, I would not recommend driving.  57/26/20 Patient's wife removed his car and had it placed at her daughter's home. This angered the patient dramatically. Furthermore, the patient's  wife is concerned that he is extremely depressed and that he may harm himself. She also feels threatened and the home due to his outbursts of uncontrolled anger. Therefore she had her daughter and son-in-law will remove the guns from his home. Patient feels extremely upset by this. He feels disrespected. He feels violated. Patient's wife states that he is extremely angry. His mood swings are rapid and fluctuating. He is extremely depressed. He reports anhedonia. He is frustrated by his memory loss. He is frustrated by his inability to drive. He is frustrated by having his personal items removed from his home without his consent. He feels like he is losing his independence and his masculinity. In the past he has had symptoms of depression with bipolar tendencies. At one time we had him on Depakote as a mood stabilizer which seemed to help dramatically. He is also taking Abilify but this was discontinued due to weight gain. Patient's wife feels unsafe at home. Patient denies suicidality. Patient denies any homicidal ideation. He states that his wife is blowing things out of proportion.  At that time, my plan was: I feel both the patient and his wife have valid arguments. The wife does not feel safe with him having guns in the home nor does she feel safe with him driving. Patient is frustrated by his loss of independence and the perceived disrespect of having his guns and car taken from him without his consent. Wife states that she did not consent the patient because she was afraid of his uncontrolled anger when she broached the subject with  him couple of his memory loss and poor judgment and impulsivity. Therefore I will treat the patient with Abilify 5 mg by mouth daily as a mood stabilizer and management for depression and recheck the patient in 2 weeks. I have agreed that he should not be driving. I have also agreed that there should not be loaded weapons in the home. However I agreed that the patient should be  asked and talked to is a human being and that showing him disrespect may help mollify some of his anger and frustration. Patient and wife agree to try  09/13/17 Patient is here today with his wife for follow-up.  His memory seems to be worsening dramatically.  He recently met with an occupational therapist, however, who has cleared him to drive back and forth to the grocery store with his wife present in the car.  However I am concerned by his apparently worsening memory.  I performed a Mini-Mental status exam today.  The patient is able to tell me the date correctly although it takes him a little bit of time.  He is able to tell me the location quickly.  He is able to remember 3 out of 3 objects quickly however on recall he can only recall 2 out of the 3.  Patient has a difficult time performing serial sevens.  He is able to perform for correctly out of 5 however it takes him repeated prompts and multiple attempts to make the correct answer and this is a patient who has a graduate degree in mathematics.  I performed testing on pattern recognition drawing a line from a to 1, 1 to B, B to 2.  Patient was unable to recognize the pattern despite explaining to me what the pattern was and that the numbers correspond to the previous letter.  He was unable to then draw the correct line to the next letter and number in that pattern.  On clock drawing, he correctly draws a circle with the correct numbers however they are all jumbled at the end with poor spacing.  He then forgot the time I told him to draw.  All of this to me in a patient with a graduate college degree shows early short-term memory problems and slight cognitive impairment.  I believe this is primarily due to vascular dementia from his numerous strokes coupled with possible MS.  They are here today to discuss treatment options.  At that time, my plan was: I have recommended that the patient begin Aricept 5 mg a day and I would like to recheck him in 1 month  and uptitrate to 10 mg if tolerable.  I will check a CBC, CMP, vitamin B12, and TSH.  I believe the patient is suffering from a combination of vascular dementia as well as his underlying MS. I have also recommended avoiding polypharmacy and I have strongly encouraged the wife to manage his medications.  I want to limit his pain medication as much as possible.  He can no longer self administer the medication.  I have recommended that his wife hold the medication and give it to him only when he is in significant pain.  Otherwise I have encouraged Tylenol.  I believe the patient has poor impulse control and frequently may take 2 or 3 pain pills at a time when he is hurting which I believe further exacerbates his underlying cognitive impairment.  Reassess in 1 month  10/13/17 Patient is tolerating Aricept 5 mg a day.  He denies any  abnormal dreams, hallucinations, stomach upset, dizziness, or depression.  Obviously, patient nor his family can tell any difference.  Both he and his wife are arguing throughout the visit today.  His wife is essentially trying to keep him at home.  She will not let him drive a car which I agree with.  She will not let him operate a lot more.  She is not letting him go to the gym unless she accompanies him.  Although this leaves the patient feeling suffocated and stifled.  This leads to arguments and frustration which are causing significant problems in the marriage. At that time, my plan was: Increase Aricept to 10 mg a day.  I discussed with the wife allowing the patient time to be away from her.  I agree that he should not be driving a car.  However I see no serious risk in him mowing the yard at his current state.  I also believe that if she can have a family member drive him to the gym 2 to 3 days a week to allow him to get exercise this would give him an outlet to release his energy and frustration as well as provide her therapeutic break to help prevent and treat caregiver  fatigue.  12/05/17 Recently admitted with AMS.  I have copied relevant portions of the DC summary below for reference:  Admit date: 11/16/2017 Discharge date: 11/18/2017  Admitted From: home  Disposition:  home    Home Health:  ordered     Discharge Condition:  stable   CODE STATUS:  DNR   Consultations:  none    Discharge Diagnoses:  Principal Problem:   Acute encephalopathy Active Problems:   MS (multiple sclerosis) ([place])   History of stroke   Cognitive impairment   Essential hypertension       Brief Summary: Bonner Larue Willardis a 78 y.o.malewith medical history significant ofdementia,multiple ischemic CVAss/p ILR placement and on eliquis, Brugada pattern on ECG, COPD, progressiveMS, non occlusive CAD.  Patient sent in to ED by wife with c/o confusion and agitation. Per wife patient has been increasingly confused, with agitation and aggression at times since Monday. No PO meds since Monday.   Hospital Course:  Acute encephalopathy - initially thought to be Wernicke's encephalopathy due to daily drinking however, no major improvement noted with Thiamine via IV but he is taking his medications appropriately - no underlying infections or abnormal blood work  - CT head and MRI noted below which are unrevealing - I have reviewed the notes from his PCP and I feel that his dementia is progressing-  - PT recommended CIR but his wife is wanting to take him home with home health to continue taking care of him  - she does state that he is getting agitated at night and also that Abilify has never helped - thus I have d/c'd the Abilify (per psych, this does need to be tapered) and started Seroquel at bedtime- she can start with 25 and give an extra dose of 25 if it is not helping but not go higher than this dose. Advised for her to take him to his PCP in about 3-4 days for a follow up.  12/05/17 Patient is here today for follow-up.  He is accompanied by  his wife.  His blood pressure has been elevated.  He called our on-call doctor and his doxazosin was increased from 2 mg a day to 4 mg a day.  However the patient has not been taking his  losartan.  Apparently he ran out of this several weeks ago.  He is also abruptly discontinued his Effexor approximately 3 weeks ago around the time he was hospitalized.  His wife states that he is back to his baseline.  She denies any further hypersomnolence or hallucinations since he has been discharged from the hospital.  He is taking Seroquel at bedtime.  He has discontinued the Abilify.  Together we reviewed his medication list at length.  He uses Requip for restless leg syndrome however I am concerned about the patient receiving a dopaminergic medication given the fact he is hallucinating at times.  I am also concerned about his polypharmacy.  Therefore I recommended discontinuing this.  I am concerned about his abrupt discontinuation of Effexor possibly exacerbating his altered mental status.  Therefore I recommended that we resume this albeit at a lower dose.  He continues to drink 2-3 hard liquor drinks a day.  He has been greatly reduced his consumption of pain medication and is using only 2 to 3 pills a week for his arthritic pain.  At that time, my planw as: The majority of our encounter today was spent discussing polypharmacy.  I recommended discontinuation of Requip given his recent hallucinations and altered mental status.  I will try to minimize the amount of centrally acting medication in this patient as I believe his dementia is worsening.  Therefore I am encouraged the patient to discontinue Requip.  I also encourage the patient to reduce his consumption of alcohol to avoid worsening his dementia and causing increased confusion.  I continue to encourage the family to decrease the use of narcotics.  We will discontinue pantoprazole as the patient does not seem to require this at the present time.  I will have him  resume his losartan 100 mg a day in addition to doxazosin 2 mg a day and metoprolol 150 mg a day and recheck his blood pressure next week to see how he is doing.  12/12/17 Since discontinuing Requip, the patient has not had any problems with restless leg.  He is not flailing in bed at night.  He is not kicking his wife.  It is not keeping him awake.  His wife states that he is slept without any difficulty.  They also have not seen any deterioration in his memory or level of consciousness.  He has had no further hallucinations.  Also he has noticed no difficulty after discontinuing Protonix.  He denies any heartburn or reflux.  His blood pressure here today is between 110 and 120/60.  I rechecked his blood pressure personally and found it to be similar.  His home blood pressures have been higher however they are still in the 130s to 859 range systolic.  Overall he is doing well with no concerns.  His wife is been able to decrease his pain medication to 2 or 3 a week.  She denies any sundowning.  She denies any delirium.  Overall he is been doing much better since leaving the hospital.  I suspect that some of his altered mental status may have been the abrupt discontinuation of venlafaxine.  Since he has resumed the medication, he has been much calmer.  At that time, my plan was: Blood pressure today is excellent.  Continue losartan, metoprolol, and Cardura at their current doses.  I have asked the patient to continue to refrain from using Requip and Protonix.  I have recommended that we continue to try to limit and minimize any polypharmacy.  Reassess the patient in 3 months or as needed as problems arise.  01/06/18 Patient's blood pressure has been elevated at home.  Blood pressure has been ranging between 433 and 295 systolic over 18-84 diastolic.  He is only been taking Cardura 2 mg.  Wife is concerned that we need to increase his Effexor because he is becoming more anxious.  On exam today, patient  demonstrates some tardive dyskinesia.  He is also demonstrating some motor tics.  He is grunting frequently for no reason.  He is also making unusual facial grimaces at times and seems to demonstrate akathisia as he is sitting.  He is rocking from side to side in a sinuous motion.  Today on his exam, he also has tachycardia.  Blood pressure is elevated at 150/70.  At that time, my plan was: I question if the akathisia, tardive dyskinesia is related to the Seroquel.  I recommended discontinuation of this.  I would not increase venlafaxine at the present time but would rather wait to see the patient's response to holding the Seroquel.  Meanwhile I would increase the Cardura to 4 mg a day because of his elevated blood pressure.  Given the tachycardia I will obtain an EKG.  EKG today shows normal sinus rhythm with a heart rate of 99 bpm.  There is no evidence of ischemia or infarction.  I recommended that the patient take an extra 50 mg of metoprolol today and then I have asked his wife to monitor his heart rate more closely at home and if greater than 90 on a consistent basis we may need to permanently increase his Toprol-XL to 150 mg a day   03/10/18 Ultimately, we put the patient back on Requip for the extraparametal symptoms..  Please see the phone notes after the patient's last office visit.  Per the patient's wife's report, the facial grimacing and motor tics improved after resuming the Requip however his depression worsened.  What has exacerbated the situation even more is a week ago, he ran out of his Effexor due to a problem with the mail order.  He has not been on his Effexor for more than a week.  Over that period of time he is more tearful.  He reports feeling more depressed.  However his depression was worsening prior to that.  With also worsening his his insight.  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  He wants to go back to college and study biology.  However the  patient has moderate dementia.  He has poor insight into his capabilities.  He is unable to drive a car.  He is unable to sustain employment.  Every day he ask his wife about doing these things causing stress on her.  Interns she is not able to take him to the gym or to do the things that he is interested in doing and he feels isolated and alone and I think this is exacerbating his depression.  Please see his wife's office visit, Tajon Moring 03/10/18) for further insight.  Past Medical History:  Diagnosis Date  . Arthritis    s/p TKR  . Bladder cancer (HCC)    [facility], [facility] low grade papillary urothileal carcinoma  . BPH (benign prostatic hyperplasia)   . Brugada syndrome    Possible Type II Brugada ECG pattern. No family history of SCD, no syncope, no tachypalpitations.  Marland Kitchen CAD (coronary artery disease)    a. LHC 1/15 - mid  LCx 50 and 60%, proximal RCA 50%  . Chronic diastolic CHF (congestive heart failure) ([place])   . CKD (chronic kidney disease)   . COPD (chronic obstructive pulmonary disease) ([place])    Prior heavy smoker. PFTs (3/11): FVC 87%, FEV1 73%, ratio 0.57, DLCO 75%, TLC 121%. Moderate obstructive defect. PFTs (9/15): Only minimal obstruction, sugPrior heavy smoker. PFTs (3/11): FVC 87%, FEV1 73%, ratio 0.57, DLCO 75%, TLC 121%. Moderate obstructive defect. PFTs (9/15) minimal obstruction, poss asthma component   . Depression    with bipolar tendencies  . DOE (dyspnea on exertion)    a. Myoview 8/09- EF 57%, no ischemia // b. Myoview (3/11) EF 68%, diaphragmatic attenuation, no ischemia //  c Echo (4/11) EF 12-87%, mild diastolic dysfunction, PASP 38 mmHg // d. PFTs 9/15 minimal obstruction  /  e. CT negative for ILD  . GERD (gastroesophageal reflux disease)   . History of Doppler ultrasound    Carotid US 3/11 negative for significant stenosis  //  carotid US 6/17: Mild bilateral plaque, 1-39% ICA  . History of echocardiogram    a. Echo 12/14 Inferior and distal septal HK, mild LVH, EF  50-55%, mild MR, mild LAE  //  b. Echo 6/17: Mild focal basal septal hypertrophy, EF 60-65%, normal wall motion, grade 1 diastolic dysfunction, mild AI  . HLD (hyperlipidemia)   . Hypertensive heart disease with CHF (congestive heart failure) ([place]) 04/08/2009  . Low testosterone   . Lung nodule    a. CT in 2015 >> PET in 12/15 not sugg of malignancy // CT 6/19: continue to look benign  . LVH (left ventricular hypertrophy)    a. Echo 12/14 Inferior and distal septal HK, mild LVH, EF 50-55%, mild MR, mild LAE  . Mild dementia ([place])   . Mitral regurgitation    Echo (4/11) with PISA ERO 0.3 cm^2 and regurgitant volume 41 mL (moderate MR). Echo (12/14) with mild MR.   . MS (multiple sclerosis) ([place])   . MS (multiple sclerosis) ([place])   . OSA (obstructive sleep apnea)   . Right bundle branch block   . Stroke ([place])   . Thoracic aortic aneurysm (HCC)    4.1 cm 2017 // Chest CTA 6/19: stable ascending thoracic aortic aneurysm 4.1 cm, aortic atherosclerosis, stable RML nodular densities (appears benign), emphysema >> FU 1 year  . Urinary retention with incomplete bladder emptying    receives botox injections and treatment for BPH through [facility]   Past Surgical History:  Procedure Laterality Date  . APPENDECTOMY    . EP IMPLANTABLE DEVICE N/A 06/23/2015   Procedure: Loop Recorder Insertion;  Surgeon: Deboraha Sprang, MD;  Location: [facility];  Service: Cardiovascular;  Laterality: N/A;  . HEMORRHOID SURGERY    . LEFT HEART CATHETERIZATION WITH CORONARY ANGIOGRAM N/A 01/05/2013   Procedure: LEFT HEART CATHETERIZATION WITH CORONARY ANGIOGRAM;  Surgeon: Larey Dresser, MD;  Location: [facility] CATH LAB;  Service: Cardiovascular;  Laterality: N/A;  . ROTATOR CUFF REPAIR    . TEE WITHOUT CARDIOVERSION N/A 06/23/2015   Procedure: TRANSESOPHAGEAL ECHOCARDIOGRAM (TEE);  Surgeon: Fay Records, MD;  Location: [facility] ENDOSCOPY;  Service: Cardiovascular;  Laterality: N/A;  . TONSILLECTOMY    . TOTAL KNEE  ARTHROPLASTY Right   . TRANSURETHRAL RESECTION OF PROSTATE     Current Outpatient Medications on File Prior to Visit  Medication Sig Dispense Refill  . acetaminophen (TYLENOL) 325 MG tablet Take 2 tablets (650 mg total) by mouth every 4 (four) hours  as needed for mild pain (or temp > 37.5 C (99.5 F)).    Marland Kitchen atorvastatin (LIPITOR) 40 MG tablet TAKE 1 TABLET DAILY (Patient taking differently: Take 40 mg by mouth daily. ) 90 tablet 2  . donepezil (ARICEPT) 10 MG tablet Take 1 tablet (10 mg total) by mouth at bedtime. 90 tablet 3  . doxazosin (CARDURA) 2 MG tablet TAKE 1 TABLET(2 MG) BY MOUTH DAILY 90 tablet 3  . ELIQUIS 5 MG TABS tablet TAKE 1 TABLET TWICE A DAY 180 tablet 3  . furosemide (LASIX) 40 MG tablet TAKE 1 TABLET(40 MG) BY MOUTH DAILY (Patient taking differently: Take 40 mg by mouth daily. ) 90 tablet 1  . gabapentin (NEURONTIN) 300 MG capsule Take 300 mg by mouth 2 (two) times daily. Patient taking 390m twice daily    . HYDROcodone-acetaminophen (NORCO) 10-325 MG tablet Take 1 tablet by mouth every 6 (six) hours as needed for moderate pain or severe pain (DX: G89.4, (G35 - MS)). 60 tablet 0  . losartan (COZAAR) 100 MG tablet Take 1 tablet (100 mg total) by mouth daily. 30 tablet 2  . metoprolol succinate (TOPROL-XL) 100 MG 24 hr tablet Take 1 1/2 tablets (150 mg total) by mouth daily. 135 tablet 3  . Multiple Vitamin (MULTIVITAMIN WITH MINERALS) TABS tablet Take 1 tablet by mouth daily.    .Marland KitchenPROAIR HFA 108 (90 Base) MCG/ACT inhaler USE 1 TO 2 INHALATIONS EVERY 6 HOURS AS NEEDED FOR WHEEZING OR SHORTNESS OF BREATH (Patient taking differently: 1-2 puffs every 6 (six) hours as needed for wheezing or shortness of breath. ) 25.5 g 4  . QUEtiapine (SEROQUEL) 25 MG tablet TAKE 1 TO 2 TABLETS BY MOUTH AT BEDTIME 30 tablet 0  . rOPINIRole (REQUIP) 1 MG tablet Take 1 tablet (1 mg total) by mouth at bedtime. 90 tablet 3  . senna-docusate (SENOKOT-S) 8.6-50 MG tablet Take 1 tablet by mouth at bedtime  as needed for mild constipation.    . Tiotropium Bromide-Olodaterol (STIOLTO RESPIMAT) 2.5-2.5 MCG/ACT AERS Inhale 2 puffs into the lungs daily. 1 Inhaler 11  . venlafaxine XR (EFFEXOR-XR) 150 MG 24 hr capsule Take 1 capsule (150 mg total) by mouth daily with breakfast. 30 capsule 3   No current facility-administered medications on file prior to visit.    Allergies  Allergen Reactions  . Alcohol-Sulfur [Sulfur] Other (See Comments)    Patient was suspected of having "Brugada Syndrome"  . Bupivacaine Other (See Comments)    Patient was suspected of having "Brugada Syndrome"  . Clomipramine Hcl Other (See Comments)    Patient was suspected of having "Brugada Syndrome"  . Flecainide Other (See Comments)    Patient was suspected of having "Brugada Syndrome"  . Lithium Other (See Comments)    Patient was suspected of having "Brugada Syndrome"  . Acetylcholine     Patient was suspected of having "Brugada Syndrome": (BrS) is a genetic condition that results in abnormal electrical activity within the heart, increasing the risk of sudden cardiac death. Those affected may have episodes of passing out.  . Amitriptyline Other (See Comments)    Patient was suspected of having "Brugada Syndrome"  . Cocaine Other (See Comments)    Patient was suspected of having "Brugada Syndrome"  . Desipramine Other (See Comments)    Patient was suspected of having "Brugada Syndrome"  . Ergonovine Other (See Comments)    Patient was suspected of having "Brugada Syndrome"  . Loxapine Other (See Comments)    Patient was  suspected of having "Brugada Syndrome"  . Nortriptyline Other (See Comments)    Patient was suspected of having "Brugada Syndrome"  . Oxcarbazepine Other (See Comments)    Patient was suspected of having "Brugada Syndrome"  . Procainamide Other (See Comments)    Patient was suspected of having "Brugada Syndrome"  . Procaine Other (See Comments)    Patient was suspected of having "Brugada  Syndrome"  . Propafenone Other (See Comments)    Patient was suspected of having "Brugada Syndrome"  . Propofol Other (See Comments)    Patient was suspected of having "Brugada Syndrome"  . Trifluoperazine Other (See Comments)    Patient was suspected of having "Brugada Syndrome"   Social History   Socioeconomic History  . Marital status: Married    Spouse name: Not on file  . Number of children: Not on file  . Years of education: Not on file  . Highest education level: Not on file  Occupational History  . Occupation: retired    Fish farm manager: RETIRED  Social Needs  . Financial resource strain: Not on file  . Food insecurity:    Worry: Not on file    Inability: Not on file  . Transportation needs:    Medical: Not on file    Non-medical: Not on file  Tobacco Use  . Smoking status: Former Smoker    Packs/day: 2.00    Years: 40.00    Pack years: 80.00    Types: Cigarettes    Last attempt to quit: 01/04/1993    Years since quitting: 25.1  . Smokeless tobacco: Former Systems developer    Types: Chew  Substance and Sexual Activity  . Alcohol use: Yes    Alcohol/week: 25.0 standard drinks    Types: 25 Cans of beer per week    Comment: 12 pack a week  . Drug use: No  . Sexual activity: Not on file  Lifestyle  . Physical activity:    Days per week: Not on file    Minutes per session: Not on file  . Stress: Not on file  Relationships  . Social connections:    Talks on phone: Not on file    Gets together: Not on file    Attends religious service: Not on file    Active member of club or organization: Not on file    Attends meetings of clubs or organizations: Not on file    Relationship status: Not on file  . Intimate partner violence:    Fear of current or ex partner: Not on file    Emotionally abused: Not on file    Physically abused: Not on file    Forced sexual activity: Not on file  Other Topics Concern  . Not on file  Social History Narrative   Retired from the [employer]  after 20 years   [place] Veteran      Review of Systems  All other systems reviewed and are negative.      Objective:   Physical Exam  Constitutional: He is oriented to person, place, and time. He appears well-developed and well-nourished. No distress.  Cardiovascular: Normal rate, regular rhythm, normal heart sounds and intact distal pulses.  No murmur heard. Pulmonary/Chest: Effort normal and breath sounds normal. No respiratory distress. He has no wheezes. He has no rales.  Neurological: He is alert and oriented to person, place, and time. He has normal reflexes. No cranial nerve deficit. He exhibits normal muscle tone. He displays no seizure activity. Coordination normal.  Skin: He is not diaphoretic.  Psychiatric: He has a normal mood and affect. His behavior is normal. Judgment and thought content normal.  Vitals reviewed.        Assessment & Plan:  Memory loss  Mood swings  Cognitive impairment  Patient shows poor insight.  His memory loss is gradually worsening.  He also shows mood swings and worsening depression.  I have recommended increasing his Effexor to 225 mg p.o. every morning and then reassessing the patient in 1 month.  As I recommended for his wife I believe the best thing for both of them with his transition into a retirement community such as [employer].  This would remove some of the responsibility for caring for her dog from her shoulders.  I believe the sense of community would give him access to a gym another man his age with whom he could interact with and do things with.  I believe this would reinvigorate the patient and remove some of the frustration he feels that his declining health.  I believe this would also help her not feel is helpless and overwhelmed.

## 2018-03-13 MED ORDER — HYDROCODONE-ACETAMINOPHEN 10-325 MG PO TABS: 1.0000 | ORAL_TABLET | Freq: Four times a day (QID) | ORAL | 0 refills | Status: DC | PRN

## 2018-04-07 ENCOUNTER — Other Ambulatory Visit: Payer: Self-pay | Admitting: *Deleted

## 2018-04-07 MED ORDER — ATORVASTATIN CALCIUM 40 MG PO TABS: 40.0000 mg | ORAL_TABLET | Freq: Every day | ORAL | 0 refills | Status: DC

## 2018-04-07 NOTE — Telephone Encounter (Signed)
Pharmacy left a msg on the refill vm requesting refills on atorvastatin. Patient needs an appointment however due to covid-19 and our office protocol regarding this I will approve one 90 day with a note that pt needs to call and schedule an appt.

## 2018-04-27 ENCOUNTER — Other Ambulatory Visit: Payer: Self-pay | Admitting: *Deleted

## 2018-04-27 MED ORDER — DOXAZOSIN MESYLATE 2 MG PO TABS: 2.0000 mg | ORAL_TABLET | Freq: Every day | ORAL | 4 refills | Status: DC

## 2018-04-27 MED ORDER — VENLAFAXINE HCL ER 225 MG PO TB24: 225.0000 mg | ORAL_TABLET | Freq: Every morning | ORAL | 4 refills | Status: DC

## 2018-04-27 MED ORDER — ROPINIROLE HCL 1 MG PO TABS: 1.0000 mg | ORAL_TABLET | Freq: Every day | ORAL | 4 refills | Status: DC

## 2018-04-27 MED ORDER — METOPROLOL SUCCINATE ER 100 MG PO TB24: ORAL_TABLET | ORAL | 4 refills | Status: DC

## 2018-04-27 MED ORDER — FUROSEMIDE 40 MG PO TABS: ORAL_TABLET | ORAL | 4 refills | Status: DC

## 2018-04-27 MED ORDER — GABAPENTIN 300 MG PO CAPS: 300.0000 mg | ORAL_CAPSULE | Freq: Two times a day (BID) | ORAL | 4 refills | Status: DC

## 2018-04-27 MED ORDER — LOSARTAN POTASSIUM 100 MG PO TABS: 100.0000 mg | ORAL_TABLET | Freq: Every day | ORAL | 4 refills | Status: AC

## 2018-05-08 ENCOUNTER — Other Ambulatory Visit: Payer: Self-pay | Admitting: Family Medicine

## 2018-05-08 MED ORDER — DOXAZOSIN MESYLATE 2 MG PO TABS: 4.0000 mg | ORAL_TABLET | Freq: Every day | ORAL | 3 refills | Status: DC

## 2018-05-08 MED ORDER — TIOTROPIUM BROMIDE-OLODATEROL 2.5-2.5 MCG/ACT IN AERS: 2.0000 | INHALATION_SPRAY | Freq: Every day | RESPIRATORY_TRACT | 3 refills | Status: DC

## 2018-05-08 MED ORDER — ROPINIROLE HCL 1 MG PO TABS: 1.0000 mg | ORAL_TABLET | Freq: Two times a day (BID) | ORAL | 4 refills | Status: DC

## 2018-05-08 MED ORDER — DOXAZOSIN MESYLATE 4 MG PO TABS: 4.0000 mg | ORAL_TABLET | Freq: Every day | ORAL | 3 refills | Status: DC

## 2018-05-08 NOTE — Telephone Encounter (Signed)
Patient is requesting a refill on Hydrocodone   LOV: 03/10/18  LRF:   03/13/18  Pt's wife states that he is doing great on the Gabapentin and wanted to go ahead and cut his pain pills down to 40 per month.

## 2018-05-09 MED ORDER — HYDROCODONE-ACETAMINOPHEN 10-325 MG PO TABS: 1.0000 | ORAL_TABLET | Freq: Four times a day (QID) | ORAL | 0 refills | Status: DC | PRN

## 2018-05-18 ENCOUNTER — Telehealth: Payer: Self-pay | Admitting: *Deleted

## 2018-05-18 NOTE — Telephone Encounter (Signed)
Received call from patient wife, [name].   Reports that she has been informed by health department that COIVD testing is negative.

## 2018-06-23 IMAGING — DX DG CHEST 2V
2 series · 2 of 2 positions shown · non-contrast
Comparison: Radiographs June 20, 2015.

CLINICAL DATA: Chronic dyspnea.

EXAM:
CHEST  2 VIEW

[chest lat]
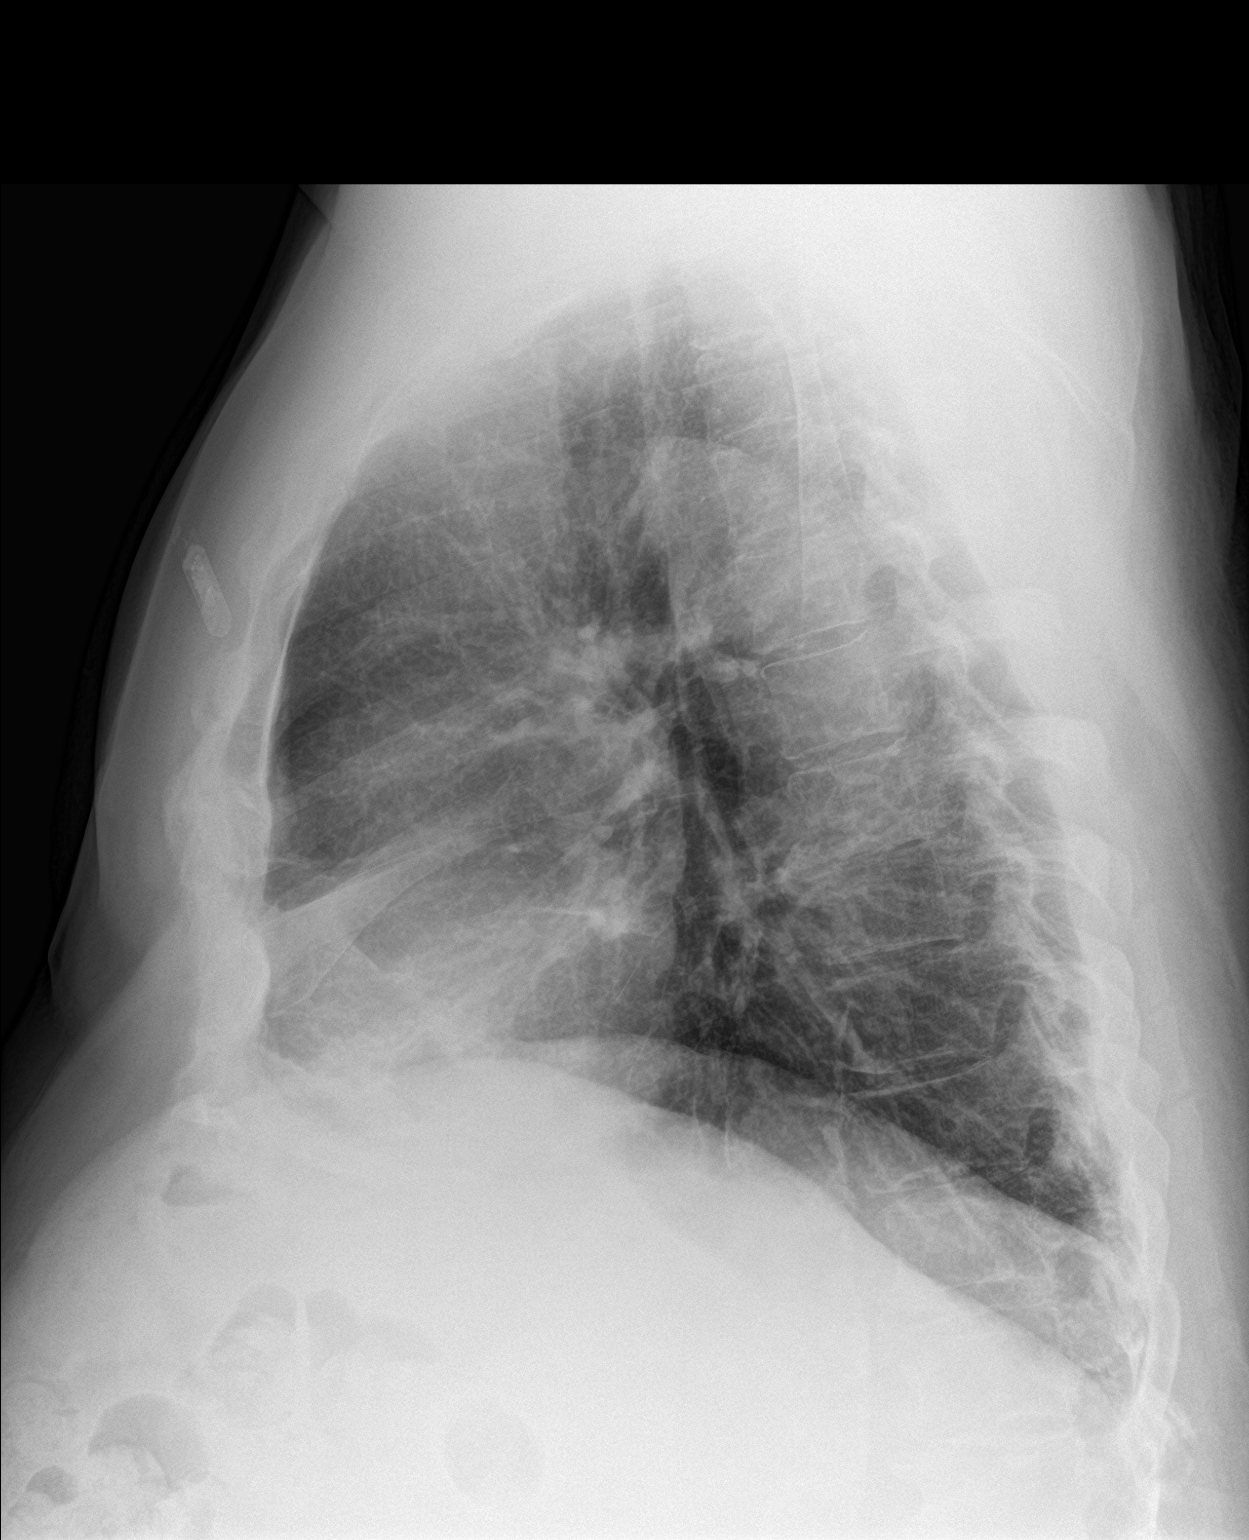

[chest pa]
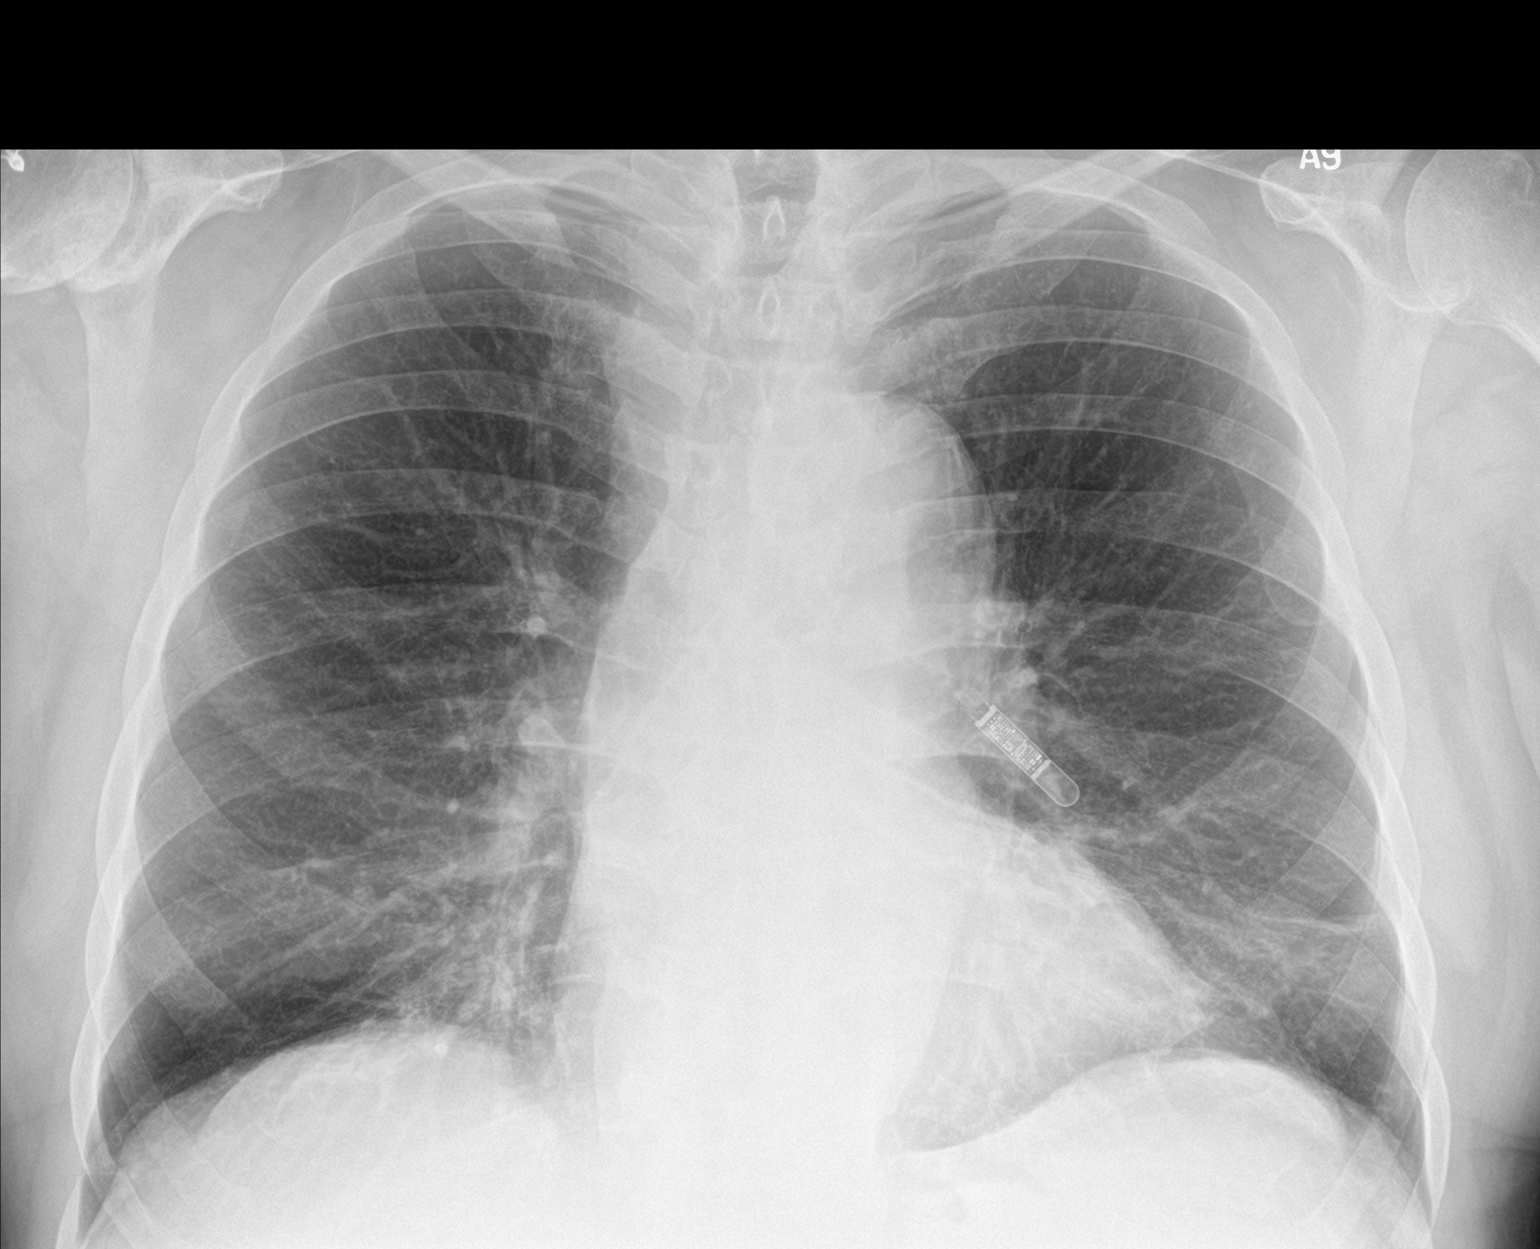

[2 of 2 positions shown; findings below may reference images not displayed]

FINDINGS: Stable cardiomediastinal silhouette. Stable left basilar scarring is
noted. No pneumothorax or pleural effusion is noted. Right lung is
clear. No acute pulmonary disease is noted. Bony thorax is
unremarkable.
IMPRESSION: Stable left basilar scarring. No acute cardiopulmonary abnormality
seen.

## 2018-06-26 ENCOUNTER — Other Ambulatory Visit: Payer: Self-pay | Admitting: *Deleted

## 2018-06-26 MED ORDER — HYDROCODONE-ACETAMINOPHEN 10-325 MG PO TABS: 1.0000 | ORAL_TABLET | Freq: Four times a day (QID) | ORAL | 0 refills | Status: DC | PRN

## 2018-06-26 NOTE — Telephone Encounter (Signed)
Received request from patient wife to refill Hydrocodone/ APAP.   Ok to refill??  Last office visit 03/10/2018.  Last refill 05/09/2018.

## 2018-06-27 ENCOUNTER — Other Ambulatory Visit: Payer: Self-pay | Admitting: Family Medicine

## 2018-06-27 MED ORDER — HYDROCODONE-ACETAMINOPHEN 10-325 MG PO TABS: 1.0000 | ORAL_TABLET | Freq: Four times a day (QID) | ORAL | 0 refills | Status: DC | PRN

## 2018-06-27 NOTE — Telephone Encounter (Signed)
Please resend

## 2018-06-27 NOTE — Telephone Encounter (Signed)
[facility] [place] - hydrocodone refill - [facility]

## 2018-07-31 DIAGNOSIS — C680 Malignant neoplasm of urethra: Secondary | ICD-10-CM | POA: Diagnosis not present

## 2018-07-31 DIAGNOSIS — N319 Neuromuscular dysfunction of bladder, unspecified: Secondary | ICD-10-CM | POA: Diagnosis not present

## 2018-08-01 ENCOUNTER — Other Ambulatory Visit: Payer: Self-pay | Admitting: Family Medicine

## 2018-08-01 MED ORDER — HYDROCODONE-ACETAMINOPHEN 10-325 MG PO TABS: 1.0000 | ORAL_TABLET | Freq: Four times a day (QID) | ORAL | 0 refills | Status: DC | PRN

## 2018-08-01 MED ORDER — ATORVASTATIN CALCIUM 40 MG PO TABS: 40.0000 mg | ORAL_TABLET | Freq: Every day | ORAL | 0 refills | Status: DC

## 2018-08-01 NOTE — Telephone Encounter (Signed)
Sent through wife's mychart -  #1, [name] [name]'s arthritis has worsened past few months. Request RX for hydocodone/acetaminophen 10/325 for #50, as we ran out on #40. Also need referral of GOOD hand surgeon to repair left hand. Prefer not to go back to [facility] for this during pandemic, even though it worked and has no other pain in right hand.  Patient is requesting a refill on Hydrocodone  (I set rx for the increased to 50 - if not ok please change)  LOV: 03/10/18  LRF:   06/27/18

## 2018-08-11 DIAGNOSIS — M13842 Other specified arthritis, left hand: Secondary | ICD-10-CM | POA: Diagnosis not present

## 2018-08-11 DIAGNOSIS — M79642 Pain in left hand: Secondary | ICD-10-CM | POA: Diagnosis not present

## 2018-08-12 DIAGNOSIS — M1812 Unilateral primary osteoarthritis of first carpometacarpal joint, left hand: Secondary | ICD-10-CM | POA: Insufficient documentation

## 2018-08-15 ENCOUNTER — Telehealth: Payer: Self-pay

## 2018-08-15 NOTE — Telephone Encounter (Signed)
Message faxed to [facility] to inform of need for appt.

## 2018-08-15 NOTE — Telephone Encounter (Signed)
   [facility] Pre-operative Risk Assessment    Request for surgical clearance:  1. What type of surgery is being performed? LT THUMB CMC ARTHROPLASTY/MAC/ELIQUIS INSTRUCTION   2. When is this surgery scheduled? TBD  3. What type of clearance is required (medical clearance vs. Pharmacy clearance to hold med vs. Both)? BOTH  4. Are there any medications that need to be held prior to surgery and how long? ELIQUIS    5. Practice name and name of physician performing surgery? [facility]; DR. Jeneen Rinks [place]  6. What is your office phone number  [phone]   7.   What is your office fax number [phone]  8.   Anesthesia type (None, local, MAC, general) ? NONE LISTED   Jacinta Shoe 08/15/2018, 11:23 AM  _________________________________________________________________   (provider comments below)

## 2018-08-15 NOTE — Telephone Encounter (Signed)
   Primary Cardiologist:Christopher End, MD  Chart reviewed as part of pre-operative protocol coverage. Because of [name] [name] past medical history and time since last visit, he/she will require a follow-up visit in order to better assess preoperative cardiovascular risk.  Pre-op covering staff: - Please schedule appointment and call patient to inform them. - Please contact requesting surgeon's office via preferred method (i.e, phone, fax) to inform them of need for appointment prior to surgery.  If applicable, this message will also be routed to pharmacy pool and/or primary cardiologist for input on holding anticoagulant/antiplatelet agent as requested below so that this information is available at time of patient's appointment.   [place], [place]  08/15/2018, 3:57 PM

## 2018-08-15 NOTE — Telephone Encounter (Addendum)
Pt takes Eliquis for recurrent embolic strokes. CrCl is 14mL/min. If pt needs to hold Eliquis, recommend only holding for 1 day due to recurrent strokes. Resume as soon as safely possible after.

## 2018-08-15 NOTE — Telephone Encounter (Signed)
Please call pt to schedule appt either with Dr. Saunders Revel or APP for cardiac clearance.

## 2018-08-16 ENCOUNTER — Encounter: Payer: Self-pay | Admitting: Family Medicine

## 2018-08-16 ENCOUNTER — Ambulatory Visit (INDEPENDENT_AMBULATORY_CARE_PROVIDER_SITE_OTHER): Payer: Medicare Other | Admitting: Family Medicine

## 2018-08-16 ENCOUNTER — Other Ambulatory Visit: Payer: Self-pay

## 2018-08-16 VITALS — BP 140/70 | HR 78 | Temp 98.3°F | Resp 18 | Ht 71.0 in | Wt 217.0 lb

## 2018-08-16 DIAGNOSIS — Z01818 Encounter for other preprocedural examination: Secondary | ICD-10-CM

## 2018-08-16 DIAGNOSIS — L03031 Cellulitis of right toe: Secondary | ICD-10-CM | POA: Diagnosis not present

## 2018-08-16 DIAGNOSIS — I6930 Unspecified sequelae of cerebral infarction: Secondary | ICD-10-CM

## 2018-08-16 MED ORDER — SULFAMETHOXAZOLE-TRIMETHOPRIM 800-160 MG PO TABS: 1.0000 | ORAL_TABLET | Freq: Two times a day (BID) | ORAL | 0 refills | Status: DC

## 2018-08-16 NOTE — Telephone Encounter (Signed)
Left message for patient to contact office to scheduled appt for cardiac clearance.

## 2018-08-16 NOTE — Progress Notes (Signed)
Subjective:    Patient ID: [name] [name], male    DOB: 1940-08-15, 78 y.o.   MRN: [id number]  HPI  10/12/16 Wife brings him patient for evaluation to determine should he still be driving. Past medical history is significant for progressive multiple sclerosis, multiple ischemic strokes, chronic osteoarthritic pain on 3 time a day hydrocodone WHICH contribute to this discussion. Physically, the patient has muscle strength 5 over 5 equal and symmetric in the arms and in the legs with normal reflexes. His physical reaction time is normal to catching a dropped object. Cerebellar exam is normal today however on Mini-Mental status exam, the patient tells me that the year is 1983. He is unable to correctly identify the month. He can #2 out of 3 objects on recall. He scores 3 out of 5 on world spelling in reverse. He is unable to perform serial sevens. It takes him 25 seconds to draw a clock although he does draw correctly. However he is unable to recognize patterns or to correctly copy intersecting pentagons.  At that time, my plan was: Patient has age-related memory loss compounded by multiple ischemic strokes, compounded by progressive multiple sclerosis, compounded by polypharmacy and chronic narcotic use all of which makes him a danger to drive. In general, I recommended that the patient not drive. Legally, I believe he would still qualify for restricted status. He must drive less than 45 miles an hour. He must avoid Interstate driving or driving on highways and remain driving only on 2 lane roads where there is less travel. I would not recommend driving at night or during conditions that would compromise his reaction time. After discussing these restrictions I explained to the patient and his wife that if he were my father, I would not recommend driving.  56/38/93 Patient's wife removed his car and had it placed at her daughter's home. This angered the patient dramatically. Furthermore, the patient's  wife is concerned that he is extremely depressed and that he may harm himself. She also feels threatened and the home due to his outbursts of uncontrolled anger. Therefore she had her daughter and son-in-law will remove the guns from his home. Patient feels extremely upset by this. He feels disrespected. He feels violated. Patient's wife states that he is extremely angry. His mood swings are rapid and fluctuating. He is extremely depressed. He reports anhedonia. He is frustrated by his memory loss. He is frustrated by his inability to drive. He is frustrated by having his personal items removed from his home without his consent. He feels like he is losing his independence and his masculinity. In the past he has had symptoms of depression with bipolar tendencies. At one time we had him on Depakote as a mood stabilizer which seemed to help dramatically. He is also taking Abilify but this was discontinued due to weight gain. Patient's wife feels unsafe at home. Patient denies suicidality. Patient denies any homicidal ideation. He states that his wife is blowing things out of proportion.  At that time, my plan was: I feel both the patient and his wife have valid arguments. The wife does not feel safe with him having guns in the home nor does she feel safe with him driving. Patient is frustrated by his loss of independence and the perceived disrespect of having his guns and car taken from him without his consent. Wife states that she did not consent the patient because she was afraid of his uncontrolled anger when she broached the subject with  him couple of his memory loss and poor judgment and impulsivity. Therefore I will treat the patient with Abilify 5 mg by mouth daily as a mood stabilizer and management for depression and recheck the patient in 2 weeks. I have agreed that he should not be driving. I have also agreed that there should not be loaded weapons in the home. However I agreed that the patient should be  asked and talked to is a human being and that showing him disrespect may help mollify some of his anger and frustration. Patient and wife agree to try  09/13/17 Patient is here today with his wife for follow-up.  His memory seems to be worsening dramatically.  He recently met with an occupational therapist, however, who has cleared him to drive back and forth to the grocery store with his wife present in the car.  However I am concerned by his apparently worsening memory.  I performed a Mini-Mental status exam today.  The patient is able to tell me the date correctly although it takes him a little bit of time.  He is able to tell me the location quickly.  He is able to remember 3 out of 3 objects quickly however on recall he can only recall 2 out of the 3.  Patient has a difficult time performing serial sevens.  He is able to perform for correctly out of 5 however it takes him repeated prompts and multiple attempts to make the correct answer and this is a patient who has a graduate degree in mathematics.  I performed testing on pattern recognition drawing a line from a to 1, 1 to B, B to 2.  Patient was unable to recognize the pattern despite explaining to me what the pattern was and that the numbers correspond to the previous letter.  He was unable to then draw the correct line to the next letter and number in that pattern.  On clock drawing, he correctly draws a circle with the correct numbers however they are all jumbled at the end with poor spacing.  He then forgot the time I told him to draw.  All of this to me in a patient with a graduate college degree shows early short-term memory problems and slight cognitive impairment.  I believe this is primarily due to vascular dementia from his numerous strokes coupled with possible MS.  They are here today to discuss treatment options.  At that time, my plan was: I have recommended that the patient begin Aricept 5 mg a day and I would like to recheck him in 1 month  and uptitrate to 10 mg if tolerable.  I will check a CBC, CMP, vitamin B12, and TSH.  I believe the patient is suffering from a combination of vascular dementia as well as his underlying MS. I have also recommended avoiding polypharmacy and I have strongly encouraged the wife to manage his medications.  I want to limit his pain medication as much as possible.  He can no longer self administer the medication.  I have recommended that his wife hold the medication and give it to him only when he is in significant pain.  Otherwise I have encouraged Tylenol.  I believe the patient has poor impulse control and frequently may take 2 or 3 pain pills at a time when he is hurting which I believe further exacerbates his underlying cognitive impairment.  Reassess in 1 month  10/13/17 Patient is tolerating Aricept 5 mg a day.  He denies any  abnormal dreams, hallucinations, stomach upset, dizziness, or depression.  Obviously, patient nor his family can tell any difference.  Both he and his wife are arguing throughout the visit today.  His wife is essentially trying to keep him at home.  She will not let him drive a car which I agree with.  She will not let him operate a lot more.  She is not letting him go to the gym unless she accompanies him.  Although this leaves the patient feeling suffocated and stifled.  This leads to arguments and frustration which are causing significant problems in the marriage. At that time, my plan was: Increase Aricept to 10 mg a day.  I discussed with the wife allowing the patient time to be away from her.  I agree that he should not be driving a car.  However I see no serious risk in him mowing the yard at his current state.  I also believe that if she can have a family member drive him to the gym 2 to 3 days a week to allow him to get exercise this would give him an outlet to release his energy and frustration as well as provide her therapeutic break to help prevent and treat caregiver  fatigue.  12/05/17 Recently admitted with AMS.  I have copied relevant portions of the DC summary below for reference:  Admit date: 11/16/2017 Discharge date: 11/18/2017  Admitted From: home  Disposition:  home    Home Health:  ordered     Discharge Condition:  stable   CODE STATUS:  DNR   Consultations:  none    Discharge Diagnoses:  Principal Problem:   Acute encephalopathy Active Problems:   MS (multiple sclerosis) ([place])   History of stroke   Cognitive impairment   Essential hypertension       Brief Summary: Junior Huezo Willardis a 78 y.o.malewith medical history significant ofdementia,multiple ischemic CVAss/p ILR placement and on eliquis, Brugada pattern on ECG, COPD, progressiveMS, non occlusive CAD.  Patient sent in to ED by wife with c/o confusion and agitation. Per wife patient has been increasingly confused, with agitation and aggression at times since Monday. No PO meds since Monday.   Hospital Course:  Acute encephalopathy - initially thought to be Wernicke's encephalopathy due to daily drinking however, no major improvement noted with Thiamine via IV but he is taking his medications appropriately - no underlying infections or abnormal blood work  - CT head and MRI noted below which are unrevealing - I have reviewed the notes from his PCP and I feel that his dementia is progressing-  - PT recommended CIR but his wife is wanting to take him home with home health to continue taking care of him  - she does state that he is getting agitated at night and also that Abilify has never helped - thus I have d/c'd the Abilify (per psych, this does need to be tapered) and started Seroquel at bedtime- she can start with 25 and give an extra dose of 25 if it is not helping but not go higher than this dose. Advised for her to take him to his PCP in about 3-4 days for a follow up.  12/05/17 Patient is here today for follow-up.  He is accompanied by  his wife.  His blood pressure has been elevated.  He called our on-call doctor and his doxazosin was increased from 2 mg a day to 4 mg a day.  However the patient has not been taking his  losartan.  Apparently he ran out of this several weeks ago.  He is also abruptly discontinued his Effexor approximately 3 weeks ago around the time he was hospitalized.  His wife states that he is back to his baseline.  She denies any further hypersomnolence or hallucinations since he has been discharged from the hospital.  He is taking Seroquel at bedtime.  He has discontinued the Abilify.  Together we reviewed his medication list at length.  He uses Requip for restless leg syndrome however I am concerned about the patient receiving a dopaminergic medication given the fact he is hallucinating at times.  I am also concerned about his polypharmacy.  Therefore I recommended discontinuing this.  I am concerned about his abrupt discontinuation of Effexor possibly exacerbating his altered mental status.  Therefore I recommended that we resume this albeit at a lower dose.  He continues to drink 2-3 hard liquor drinks a day.  He has been greatly reduced his consumption of pain medication and is using only 2 to 3 pills a week for his arthritic pain.  At that time, my planw as: The majority of our encounter today was spent discussing polypharmacy.  I recommended discontinuation of Requip given his recent hallucinations and altered mental status.  I will try to minimize the amount of centrally acting medication in this patient as I believe his dementia is worsening.  Therefore I am encouraged the patient to discontinue Requip.  I also encourage the patient to reduce his consumption of alcohol to avoid worsening his dementia and causing increased confusion.  I continue to encourage the family to decrease the use of narcotics.  We will discontinue pantoprazole as the patient does not seem to require this at the present time.  I will have him  resume his losartan 100 mg a day in addition to doxazosin 2 mg a day and metoprolol 150 mg a day and recheck his blood pressure next week to see how he is doing.  12/12/17 Since discontinuing Requip, the patient has not had any problems with restless leg.  He is not flailing in bed at night.  He is not kicking his wife.  It is not keeping him awake.  His wife states that he is slept without any difficulty.  They also have not seen any deterioration in his memory or level of consciousness.  He has had no further hallucinations.  Also he has noticed no difficulty after discontinuing Protonix.  He denies any heartburn or reflux.  His blood pressure here today is between 110 and 120/60.  I rechecked his blood pressure personally and found it to be similar.  His home blood pressures have been higher however they are still in the 130s to 811 range systolic.  Overall he is doing well with no concerns.  His wife is been able to decrease his pain medication to 2 or 3 a week.  She denies any sundowning.  She denies any delirium.  Overall he is been doing much better since leaving the hospital.  I suspect that some of his altered mental status may have been the abrupt discontinuation of venlafaxine.  Since he has resumed the medication, he has been much calmer.  At that time, my plan was: Blood pressure today is excellent.  Continue losartan, metoprolol, and Cardura at their current doses.  I have asked the patient to continue to refrain from using Requip and Protonix.  I have recommended that we continue to try to limit and minimize any polypharmacy.  Reassess the patient in 3 months or as needed as problems arise.  01/06/18 Patient's blood pressure has been elevated at home.  Blood pressure has been ranging between 094 and 709 systolic over 62-83 diastolic.  He is only been taking Cardura 2 mg.  Wife is concerned that we need to increase his Effexor because he is becoming more anxious.  On exam today, patient  demonstrates some tardive dyskinesia.  He is also demonstrating some motor tics.  He is grunting frequently for no reason.  He is also making unusual facial grimaces at times and seems to demonstrate akathisia as he is sitting.  He is rocking from side to side in a sinuous motion.  Today on his exam, he also has tachycardia.  Blood pressure is elevated at 150/70.  At that time, my plan was: I question if the akathisia, tardive dyskinesia is related to the Seroquel.  I recommended discontinuation of this.  I would not increase venlafaxine at the present time but would rather wait to see the patient's response to holding the Seroquel.  Meanwhile I would increase the Cardura to 4 mg a day because of his elevated blood pressure.  Given the tachycardia I will obtain an EKG.  EKG today shows normal sinus rhythm with a heart rate of 99 bpm.  There is no evidence of ischemia or infarction.  I recommended that the patient take an extra 50 mg of metoprolol today and then I have asked his wife to monitor his heart rate more closely at home and if greater than 90 on a consistent basis we may need to permanently increase his Toprol-XL to 150 mg a day   03/10/18 Ultimately, we put the patient back on Requip for the extraparametal symptoms..  Please see the phone notes after the patient's last office visit.  Per the patient's wife's report, the facial grimacing and motor tics improved after resuming the Requip however his depression worsened.  What has exacerbated the situation even more is a week ago, he ran out of his Effexor due to a problem with the mail order.  He has not been on his Effexor for more than a week.  Over that period of time he is more tearful.  He reports feeling more depressed.  However his depression was worsening prior to that.  With also worsening his his insight.  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  He wants to go back to college and study biology.  However the  patient has moderate dementia.  He has poor insight into his capabilities.  He is unable to drive a car.  He is unable to sustain employment.  Every day he ask his wife about doing these things causing stress on her.  Interns she is not able to take him to the gym or to do the things that he is interested in doing and he feels isolated and alone and I think this is exacerbating his depression.  Please see his wife's office visit, Fount Bahe 03/10/18) for further insight.  At that time, my plan was: Patient shows poor insight.  His memory loss is gradually worsening.  He also shows mood swings and worsening depression.  I have recommended increasing his Effexor to 225 mg p.o. every morning and then reassessing the patient in 1 month.  As I recommended for his wife I believe the best thing for both of them with his transition into a retirement community such as  [place].  This would remove some of the responsibility for caring for her dog from her shoulders.  I believe the sense of community would give him access to a gym another man his age with whom he could interact with and do things with.  I believe this would reinvigorate the patient and remove some of the frustration he feels that his declining health.  I believe this would also help her not feel is helpless and overwhelmed.  08/16/18 Patient is here today with his wife for preoperative evaluation.  He reports significant pain in his left first [facility] joint.  Orthopedics is evaluated the patient and recommended surgery to treat the condition.  He is here today for preoperative clearance.  He denies any chest pain.  He denies any angina.  He denies any shortness of breath or dyspnea on exertion.  He denies any paroxysmal nocturnal dyspnea.  He denies any orthopnea.  There is no peripheral edema.  EKG today shows normal sinus rhythm with no evidence of ischemia or infarction.  Patient is due for lab work.  However my biggest concern is his anticoagulant.   The patient is currently on Eliquis having suffered several strokes despite first being on aspirin and then Plavix.  Ultimately neurology felt Eliquis was his best option and the patient has not had any further strokes since starting Eliquis.  He has vascular dementia stemming from the strokes versus Alzheimer dementia.  I spent 15 minutes today with the patient and his wife explaining that he would have to discontinue the medication for 3 days prior to surgery and that while off the medication he would be at increased risk of stroke.  Although the risk of stroke would be extremely low, the risk is not 0.  Therefore my question for both the patient and his family is whether they are willing to accept that risk in order to have an elective surgery on his hand.  Past Medical History:  Diagnosis Date   Arthritis    s/p TKR   Bladder cancer ([place])    [facility] low grade papillary urothileal carcinoma   BPH (benign prostatic hyperplasia)    Brugada syndrome    Possible Type II Brugada ECG pattern. No family history of SCD, no syncope, no tachypalpitations.   CAD (coronary artery disease)    a. LHC 1/15 - mid LCx 50 and 60%, proximal RCA 50%   Chronic diastolic CHF (congestive heart failure) (HCC)    CKD (chronic kidney disease)    COPD (chronic obstructive pulmonary disease) ([place])    Prior heavy smoker. PFTs (3/11): FVC 87%, FEV1 73%, ratio 0.57, DLCO 75%, TLC 121%. Moderate obstructive defect. PFTs (9/15): Only minimal obstruction, sugPrior heavy smoker. PFTs (3/11): FVC 87%, FEV1 73%, ratio 0.57, DLCO 75%, TLC 121%. Moderate obstructive defect. PFTs (9/15) minimal obstruction, poss asthma component    Depression    with bipolar tendencies   DOE (dyspnea on exertion)    a. Myoview 8/09- EF 57%, no ischemia // b. Myoview (3/11) EF 68%, diaphragmatic attenuation, no ischemia //  c Echo (4/11) EF 62-13%, mild diastolic dysfunction, PASP 38 mmHg // d. PFTs 9/15 minimal obstruction  /  e. CT  negative for ILD   GERD (gastroesophageal reflux disease)    History of Doppler ultrasound    Carotid US 3/11 negative for significant stenosis  //  carotid US 6/17: Mild bilateral plaque, 1-39% ICA   History of echocardiogram    a. Echo 12/14 Inferior and distal  septal HK, mild LVH, EF 50-55%, mild MR, mild LAE  //  b. Echo 6/17: Mild focal basal septal hypertrophy, EF 60-65%, normal wall motion, grade 1 diastolic dysfunction, mild AI   HLD (hyperlipidemia)    Hypertensive heart disease with CHF (congestive heart failure) ([place]) 04/08/2009   Low testosterone    Lung nodule    a. CT in 2015 >> PET in 12/15 not sugg of malignancy // CT 6/19: continue to look benign   LVH (left ventricular hypertrophy)    a. Echo 12/14 Inferior and distal septal HK, mild LVH, EF 50-55%, mild MR, mild LAE   Mild dementia (HCC)    Mitral regurgitation    Echo (4/11) with PISA ERO 0.3 cm^2 and regurgitant volume 41 mL (moderate MR). Echo (12/14) with mild MR.    MS (multiple sclerosis) (HCC)    MS (multiple sclerosis) (HCC)    OSA (obstructive sleep apnea)    Right bundle branch block    Stroke [facility])    Thoracic aortic aneurysm (HCC)    4.1 cm 2017 // Chest CTA 6/19: stable ascending thoracic aortic aneurysm 4.1 cm, aortic atherosclerosis, stable RML nodular densities (appears benign), emphysema >> FU 1 year   Urinary retention with incomplete bladder emptying    receives botox injections and treatment for BPH through [facility]   Past Surgical History:  Procedure Laterality Date   APPENDECTOMY     EP IMPLANTABLE DEVICE N/A 06/23/2015   Procedure: Loop Recorder Insertion;  Surgeon: Deboraha Sprang, MD;  Location: [facility] CV LAB;  Service: Cardiovascular;  Laterality: N/A;   HEMORRHOID SURGERY     LEFT HEART CATHETERIZATION WITH CORONARY ANGIOGRAM N/A 01/05/2013   Procedure: LEFT HEART CATHETERIZATION WITH CORONARY ANGIOGRAM;  Surgeon: Larey Dresser, MD;  Location[facility] C[facility] LAB;  Service:  Cardiovascular;  Laterality: N/A;   ROTATOR CUFF REPAIR     TEE WITHOUT CARDIOVERSION N/A 06/23/2015   Procedure: TRANSESOPHAGEAL ECHOCARDIOGRAM (TEE);  Surgeon: Fay Records, MD;  Location: [facility];  Service: Cardiovascular;  Laterality: N/A;   TONSILLECTOMY     TOTAL KNEE ARTHROPLASTY Right    TRANSURETHRAL RESECTION OF PROSTATE     Current Outpatient Medications on File Prior to Visit  Medication Sig Dispense Refill   acetaminophen (TYLENOL) 325 MG tablet Take 2 tablets (650 mg total) by mouth every 4 (four) hours as needed for mild pain (or temp > 37.5 C (99.5 F)).     atorvastatin (LIPITOR) 40 MG tablet Take 1 tablet (40 mg total) by mouth daily. 90 tablet 0   donepezil (ARICEPT) 10 MG tablet Take 1 tablet (10 mg total) by mouth at bedtime. 90 tablet 3   doxazosin (CARDURA) 4 MG tablet Take 1 tablet (4 mg total) by mouth daily. 90 tablet 3   ELIQUIS 5 MG TABS tablet TAKE 1 TABLET TWICE A DAY 180 tablet 3   furosemide (LASIX) 40 MG tablet TAKE 1 TABLET(40 MG) BY MOUTH DAILY 90 tablet 4   gabapentin (NEURONTIN) 300 MG capsule Take 1 capsule (300 mg total) by mouth 2 (two) times daily. Patient taking 331m twice daily 180 capsule 4   HYDROcodone-acetaminophen (NORCO) 10-325 MG tablet Take 1 tablet by mouth every 6 (six) hours as needed for moderate pain or severe pain (DX: G89.4, (G35 - MS)). 50 tablet 0   losartan (COZAAR) 100 MG tablet Take 1 tablet (100 mg total) by mouth daily. 90 tablet 4   metoprolol succinate (TOPROL-XL) 100 MG 24 hr tablet Take 1  1/2 tablets (150 mg total) by mouth daily. 135 tablet 4   Multiple Vitamin (MULTIVITAMIN WITH MINERALS) TABS tablet Take 1 tablet by mouth daily.     PROAIR HFA 108 (90 Base) MCG/ACT inhaler USE 1 TO 2 INHALATIONS EVERY 6 HOURS AS NEEDED FOR WHEEZING OR SHORTNESS OF BREATH (Patient taking differently: 1-2 puffs every 6 (six) hours as needed for wheezing or shortness of breath. ) 25.5 g 4   rOPINIRole (REQUIP) 1 MG tablet  Take 1 tablet (1 mg total) by mouth 2 (two) times a day. 180 tablet 4   senna-docusate (SENOKOT-S) 8.6-50 MG tablet Take 1 tablet by mouth at bedtime as needed for mild constipation.     Tiotropium Bromide-Olodaterol (STIOLTO RESPIMAT) 2.5-2.5 MCG/ACT AERS Inhale 2 puffs into the lungs daily. 3 Inhaler 3   Venlafaxine HCl 225 MG TB24 Take 1 tablet (225 mg total) by mouth every morning. 90 each 4   No current facility-administered medications on file prior to visit.    Allergies  Allergen Reactions   Alcohol-Sulfur [Sulfur] Other (See Comments)    Patient was suspected of having "Brugada Syndrome"   Bupivacaine Other (See Comments)    Patient was suspected of having "Brugada Syndrome"   Clomipramine Hcl Other (See Comments)    Patient was suspected of having "Brugada Syndrome"   Flecainide Other (See Comments)    Patient was suspected of having "Brugada Syndrome"   Lithium Other (See Comments)    Patient was suspected of having "Brugada Syndrome"   Acetylcholine     Patient was suspected of having "Brugada Syndrome": (BrS) is a genetic condition that results in abnormal electrical activity within the heart, increasing the risk of sudden cardiac death. Those affected may have episodes of passing out.   Amitriptyline Other (See Comments)    Patient was suspected of having "Brugada Syndrome"   Cocaine Other (See Comments)    Patient was suspected of having "Brugada Syndrome"   Desipramine Other (See Comments)    Patient was suspected of having "Brugada Syndrome"   Ergonovine Other (See Comments)    Patient was suspected of having "Brugada Syndrome"   Loxapine Other (See Comments)    Patient was suspected of having "Brugada Syndrome"   Nortriptyline Other (See Comments)    Patient was suspected of having "Brugada Syndrome"   Oxcarbazepine Other (See Comments)    Patient was suspected of having "Brugada Syndrome"   Procainamide Other (See Comments)    Patient was  suspected of having "Brugada Syndrome"   Procaine Other (See Comments)    Patient was suspected of having "Brugada Syndrome"   Propafenone Other (See Comments)    Patient was suspected of having "Brugada Syndrome"   Propofol Other (See Comments)    Patient was suspected of having "Brugada Syndrome"   Trifluoperazine Other (See Comments)    Patient was suspected of having "Brugada Syndrome"   Social History   Socioeconomic History   Marital status: Married    Spouse name: Not on file   Number of children: Not on file   Years of education: Not on file   Highest education level: Not on file  Occupational History   Occupation: retired    Fish farm manager: RETIRED  Social Designer, fashion/clothing strain: Not on file   Food insecurity    Worry: Not on file    Inability: Not on file   Transportation needs    Medical: Not on file    Non-medical: Not on file  Tobacco  Use   Smoking status: Former Smoker    Packs/day: 2.00    Years: 40.00    Pack years: 80.00    Types: Cigarettes    Quit date: 01/04/1993    Years since quitting: 25.6   Smokeless tobacco: Former Systems developer    Types: Chew  Substance and Sexual Activity   Alcohol use: Yes    Alcohol/week: 25.0 standard drinks    Types: 25 Cans of beer per week    Comment: 12 pack a week   Drug use: No   Sexual activity: Not on file  Lifestyle   Physical activity    Days per week: Not on file    Minutes per session: Not on file   Stress: Not on file  Relationships   Social connections    Talks on phone: Not on file    Gets together: Not on file    Attends religious service: Not on file    Active member of club or organization: Not on file    Attends meetings of clubs or organizations: Not on file    Relationship status: Not on file   Intimate partner violence    Fear of current or ex partner: Not on file    Emotionally abused: Not on file    Physically abused: Not on file    Forced sexual activity: Not on file   Other Topics Concern   Not on file  Social History Narrative   Retired from the [employer] after 20 years   [place] Veteran      Review of Systems  All other systems reviewed and are negative.      Objective:   Physical Exam  Constitutional: He is oriented to person, place, and time. He appears well-developed and well-nourished. No distress.  Cardiovascular: Normal rate, regular rhythm, normal heart sounds and intact distal pulses.  No murmur heard. Pulmonary/Chest: Effort normal and breath sounds normal. No respiratory distress. He has no wheezes. He has no rales.  Neurological: He is alert and oriented to person, place, and time. He has normal reflexes. No cranial nerve deficit. He exhibits normal muscle tone. He displays no seizure activity. Coordination normal.  Skin: He is not diaphoretic.  Psychiatric: He has a normal mood and affect. His behavior is normal. Judgment and thought content normal.  Vitals reviewed.  Right great toe is erythematous warm and tender.  Patient injured the toe stumbling over a brick a few days ago and suffered an abrasion on the dorsum.  Please see the picture:   Although the picture does not do justice, the toe is erythematous warm and tender distal to the abrasion.      Assessment & Plan:  1. Preop testing I see no contraindications to a low risk outpatient surgery.  However I did explain that they would have to discontinue the Eliquis 3 days prior to surgery and that this would put the patient at increased risk for stroke while off the anticoagulant.  Patient and wife state that the pain in his hand is severe and that they are willing to accept that risk in order to help ameliorate his pain.  Therefore the patient may proceed with his upcoming surgery at the convenience of the patient and his orthopedic surgeon. - EKG 12-Lead - CBC with Differential/Platelet - COMPLETE METABOLIC PANEL WITH GFR  2. Cellulitis of great toe of right  foot Begin Bactrim double strength tablets 1 p.o. twice daily for 7 days

## 2018-08-17 ENCOUNTER — Telehealth: Payer: Self-pay

## 2018-08-17 LAB — COMPLETE METABOLIC PANEL WITH GFR
AG Ratio: 1.4 (calc) (ref 1.0–2.5)
ALT: 17 U/L (ref 9–46)
AST: 20 U/L (ref 10–35)
Albumin: 4.2 g/dL (ref 3.6–5.1)
Alkaline phosphatase (APISO): 88 U/L (ref 35–144)
BUN/Creatinine Ratio: 17 (calc) (ref 6–22)
BUN: 21 mg/dL (ref 7–25)
CO2: 29 mmol/L (ref 20–32)
Calcium: 9.4 mg/dL (ref 8.6–10.3)
Chloride: 103 mmol/L (ref 98–110)
Creat: 1.21 mg/dL — ABNORMAL HIGH (ref 0.70–1.18)
GFR, Est African American: 67 mL/min/{1.73_m2} (ref >=60)
GFR, Est Non African American: 58 mL/min/{1.73_m2} — ABNORMAL LOW (ref >=60)
Globulin: 3 g/dL (calc) (ref 1.9–3.7)
Glucose, Bld: 107 mg/dL — ABNORMAL HIGH (ref 65–99)
Potassium: 4.7 mmol/L (ref 3.5–5.3)
Sodium: 141 mmol/L (ref 135–146)
Total Bilirubin: 0.4 mg/dL (ref 0.2–1.2)
Total Protein: 7.2 g/dL (ref 6.1–8.1)

## 2018-08-17 LAB — CBC WITH DIFFERENTIAL/PLATELET
Absolute Monocytes: 865 cells/uL (ref 200–950)
Basophils Absolute: 91 cells/uL (ref 0–200)
Basophils Relative: 1 %
Eosinophils Absolute: 692 cells/uL — ABNORMAL HIGH (ref 15–500)
Eosinophils Relative: 7.6 %
HCT: 46.1 % (ref 38.5–50.0)
Hemoglobin: 15.4 g/dL (ref 13.2–17.1)
Lymphs Abs: 2257 cells/uL (ref 850–3900)
MCH: 32.1 pg (ref 27.0–33.0)
MCHC: 33.4 g/dL (ref 32.0–36.0)
MCV: 96 fL (ref 80.0–100.0)
MPV: 11.5 fL (ref 7.5–12.5)
Monocytes Relative: 9.5 %
Neutro Abs: 5196 cells/uL (ref 1500–7800)
Neutrophils Relative %: 57.1 %
Platelets: 285 10*3/uL (ref 140–400)
RBC: 4.8 10*6/uL (ref 4.20–5.80)
RDW: 14 % (ref 11.0–15.0)
Total Lymphocyte: 24.8 %
WBC: 9.1 10*3/uL (ref 3.8–10.8)

## 2018-08-17 NOTE — Telephone Encounter (Signed)
Pt is scheduled for a virtual visit with Richardson Dopp, PA on 8/14

## 2018-08-17 NOTE — Progress Notes (Signed)
Virtual Visit via Video Note   This visit type was conducted due to national recommendations for restrictions regarding the COVID-19 Pandemic (e.g. social distancing) in an effort to limit this patient's exposure and mitigate transmission in our community.  Due to his co-morbid illnesses, this patient is at least at moderate risk for complications without adequate follow up.  This format is felt to be most appropriate for this patient at this time.  All issues noted in this document were discussed and addressed.  A limited physical exam was performed with this format.  Please refer to the patient's chart for his consent to telehealth for [facility].   Date:  08/18/2018   ID:  [name] [name], DOB Nov 09, 1940, MRN [id number]  Patient Location: Home Provider Location: Home  PCP:  [name], MD  Cardiologist:  Nelva Bush, MD >> will change to Dr. Juliann Pulse, PA-C  Electrophysiologist:  None  Pulmonology: Dr. Chase Caller Neurology: Dr. Erlinda Hong  Evaluation Performed:  Follow-Up Visit  Chief Complaint: Surgical clearance  History of Present Illness:    [name] [name] is a 78 y.o. male with:  Coronary artery disease   Non-obs by cath in 9798  Diastolic CHF  COPD  Recurrent embolic CVAs  Tx with tPa in 4/18  S/p Linq ILR  Chronic anticoagulation w/ Apixaban  Thoracic aortic aneurysm  CT 2018: mod thoracic aortic atheroclerosis  Hypertension   Hyperlipidemia   OSA on CPAP  Urethral Ca  Multiple sclerosis  Dementia   [name] [name] was last seen in March 2019.  He has significant left carpometacarpal arthritis and needs to undergo surgery with Dr. Jeannie Fend.  The patient has not had any chest discomfort or significant shortness of breath, syncope orthopnea or leg swelling.  He is tolerating anticoagulation.  He has not had any bleeding issues.  Unfortunately, he is smoking.  The patient does not have symptoms concerning for COVID-19 infection  (fever, chills, cough, or new shortness of breath).    Past Medical History:  Diagnosis Date   Arthritis    s/p TKR   Bladder cancer ([place])    [facility] low grade papillary urothileal carcinoma   BPH (benign prostatic hyperplasia)    Brugada syndrome    Possible Type II Brugada ECG pattern. No family history of SCD, no syncope, no tachypalpitations.   CAD (coronary artery disease)    a. LHC 1/15 - mid LCx 50 and 60%, proximal RCA 50%   Chronic diastolic CHF (congestive heart failure) (HCC)    CKD (chronic kidney disease)    COPD (chronic obstructive pulmonary disease) ([place])    Prior heavy smoker. PFTs (3/11): FVC 87%, FEV1 73%, ratio 0.57, DLCO 75%, TLC 121%. Moderate obstructive defect. PFTs (9/15): Only minimal obstruction, sugPrior heavy smoker. PFTs (3/11): FVC 87%, FEV1 73%, ratio 0.57, DLCO 75%, TLC 121%. Moderate obstructive defect. PFTs (9/15) minimal obstruction, poss asthma component    Depression    with bipolar tendencies   DOE (dyspnea on exertion)    a. Myoview 8/09- EF 57%, no ischemia // b. Myoview (3/11) EF 68%, diaphragmatic attenuation, no ischemia //  c Echo (4/11) EF 92-11%, mild diastolic dysfunction, PASP 38 mmHg // d. PFTs 9/15 minimal obstruction  /  e. CT negative for ILD   GERD (gastroesophageal reflux disease)    History of Doppler ultrasound    Carotid US 3/11 negative for significant stenosis  //  carotid US 6/17: Mild bilateral plaque, 1-39% ICA   History of echocardiogram  a. Echo 12/14 Inferior and distal septal HK, mild LVH, EF 50-55%, mild MR, mild LAE  //  b. Echo 6/17: Mild focal basal septal hypertrophy, EF 60-65%, normal wall motion, grade 1 diastolic dysfunction, mild AI   HLD (hyperlipidemia)    Hypertensive heart disease with CHF (congestive heart failure) ([place]) 04/08/2009   Low testosterone    Lung nodule    a. CT in 2015 >> PET in 12/15 not sugg of malignancy // CT 6/19: continue to look benign   LVH (left ventricular  hypertrophy)    a. Echo 12/14 Inferior and distal septal HK, mild LVH, EF 50-55%, mild MR, mild LAE   Mild dementia (HCC)    Mitral regurgitation    Echo (4/11) with PISA ERO 0.3 cm^2 and regurgitant volume 41 mL (moderate MR). Echo (12/14) with mild MR.    MS (multiple sclerosis) (HCC)    MS (multiple sclerosis) (HCC)    OSA (obstructive sleep apnea)    Right bundle branch block    Stroke [facility])    Thoracic aortic aneurysm (HCC)    4.1 cm 2017 // Chest CTA 6/19: stable ascending thoracic aortic aneurysm 4.1 cm, aortic atherosclerosis, stable RML nodular densities (appears benign), emphysema >> FU 1 year   Urinary retention with incomplete bladder emptying    receives botox injections and treatment for BPH through [facility]   Past Surgical History:  Procedure Laterality Date   APPENDECTOMY     EP IMPLANTABLE DEVICE N/A 06/23/2015   Procedure: Loop Recorder Insertion;  Surgeon: Deboraha Sprang, MD;  Location: [facility];  Service: Cardiovascular;  Laterality: N/A;   HEMORRHOID SURGERY     LEFT HEART CATHETERIZATION WITH CORONARY ANGIOGRAM N/A 01/05/2013   Procedure: LEFT HEART CATHETERIZATION WITH CORONARY ANGIOGRAM;  Surgeon: Larey Dresser, MD;  Location: [facility] CATH LAB;  Service: Cardiovascular;  Laterality: N/A;   ROTATOR CUFF REPAIR     TEE WITHOUT CARDIOVERSION N/A 06/23/2015   Procedure: TRANSESOPHAGEAL ECHOCARDIOGRAM (TEE);  Surgeon: Fay Records, MD;  Location: [facility];  Service: Cardiovascular;  Laterality: N/A;   TONSILLECTOMY     TOTAL KNEE ARTHROPLASTY Right    TRANSURETHRAL RESECTION OF PROSTATE       Current Meds  Medication Sig   acetaminophen (TYLENOL) 325 MG tablet Take 2 tablets (650 mg total) by mouth every 4 (four) hours as needed for mild pain (or temp > 37.5 C (99.5 F)).   atorvastatin (LIPITOR) 40 MG tablet Take 1 tablet (40 mg total) by mouth daily.   donepezil (ARICEPT) 10 MG tablet Take 1 tablet (10 mg total) by mouth at bedtime.    doxazosin (CARDURA) 4 MG tablet Take 1 tablet (4 mg total) by mouth daily.   ELIQUIS 5 MG TABS tablet TAKE 1 TABLET TWICE A DAY   furosemide (LASIX) 40 MG tablet TAKE 1 TABLET(40 MG) BY MOUTH DAILY   gabapentin (NEURONTIN) 300 MG capsule Take 1 capsule (300 mg total) by mouth 2 (two) times daily. Patient taking 300mg  twice daily   HYDROcodone-acetaminophen (NORCO) 10-325 MG tablet Take 1 tablet by mouth every 6 (six) hours as needed for moderate pain or severe pain (DX: G89.4, (G35 - MS)).   losartan (COZAAR) 100 MG tablet Take 1 tablet (100 mg total) by mouth daily.   metoprolol succinate (TOPROL-XL) 100 MG 24 hr tablet Take 1 1/2 tablets (150 mg total) by mouth daily.   Multiple Vitamin (MULTIVITAMIN WITH MINERALS) TABS tablet Take 1 tablet by mouth daily.  PROAIR HFA 108 (90 Base) MCG/ACT inhaler USE 1 TO 2 INHALATIONS EVERY 6 HOURS AS NEEDED FOR WHEEZING OR SHORTNESS OF BREATH (Patient taking differently: 1-2 puffs every 6 (six) hours as needed for wheezing or shortness of breath. )   rOPINIRole (REQUIP) 1 MG tablet Take 1 tablet (1 mg total) by mouth 2 (two) times a day.   senna-docusate (SENOKOT-S) 8.6-50 MG tablet Take 1 tablet by mouth at bedtime as needed for mild constipation.   sulfamethoxazole-trimethoprim (BACTRIM DS) 800-160 MG tablet Take 1 tablet by mouth 2 (two) times daily.   Tiotropium Bromide-Olodaterol (STIOLTO RESPIMAT) 2.5-2.5 MCG/ACT AERS Inhale 2 puffs into the lungs daily.   Venlafaxine HCl 225 MG TB24 Take 1 tablet (225 mg total) by mouth every morning.     Allergies:   Alcohol-sulfur [sulfur], Bupivacaine, Clomipramine hcl, Flecainide, Lithium, Acetylcholine, Amitriptyline, Cocaine, Desipramine, Ergonovine, Loxapine, Nortriptyline, Oxcarbazepine, Procainamide, Procaine, Propafenone, Propofol, and Trifluoperazine   Social History   Tobacco Use   Smoking status: Current Every Day Smoker    Packs/day: 2.00    Years: 40.00    Pack years: 80.00     Types: Cigarettes   Smokeless tobacco: Former Systems developer    Types: Chew  Substance Use Topics   Alcohol use: Yes    Alcohol/week: 25.0 standard drinks    Types: 25 Cans of beer per week    Comment: 12 pack a week   Drug use: No     Family Hx: The patient's family history includes Arthritis in his brother; Hypertension in his sister and unknown relative; Kidney cancer in his brother; Other in his brother; Stroke in his father and mother. There is no history of Prostate cancer or Bladder Cancer.  ROS:   Please see the history of present illness.     All other systems reviewed and are negative.   Prior CV studies:   The following studies were reviewed today:  Chest CTA 06/23/17 IMPRESSION: 1. Essentially stable ascending thoracic aortic aneurysm, measuring 4.1 cm, previously 4.0 cm. Recommend annual imaging followup by CTA or MRA. This recommendation follows 2010 ACCF/AHA/AATS/ACR/ASA/SCA/SCAI/SIR/STS/SVM Guidelines for the Diagnosis and Management of Patients with Thoracic Aortic Disease. Circulation. 2010; 121: D326-Z124 2.  Aortic atherosclerosis (ICD10-I70.0). 3. Stable nodular densities within the right middle lobe, favoring a benign process. 4.  Emphysema (ICD10-J43.9).  Chest CTA 6/18 Stable fusiform dilatation of the ascending thoracic aorta measuring 4 cm   Chest CTA 04/28/16 IMPRESSION: 1. Stable fusiform dilatation of the ascending aorta measuring 4.0 cm in maximum diameter. Recommend annual imaging followup by CTA or MRA. This recommendation follows 2010 ACCF/AHA/AATS/ACR/ASA/SCA/SCAI/SIR/STS/SVM Guidelines for the Diagnosis and Management of Patients with Thoracic Aortic Disease. Circulation. 2010; 121: P809-X833. 2. Moderate thoracic aortic atherosclerosis. 3. Coronary artery calcifications. 4. Stable pulmonary nodules on the right consistent with prior inflammation and probable subsequent scarring. No new or enlarging pulmonary nodule is seen.   Echo  04/06/16 EF 55-60, normal wall motion, normal diastolic function, mild AI, mild MR   Echo 06/16/15 Mild focal basal septal hypertrophy, EF 60-65%, normal wall motion, grade 1 diastolic dysfunction, mild AI   Carotid US 06/16/15 Summary:Bilateral: mild soft plaque CCA and origin ICA. 1-39% ICA plaquing.   Chest CT 6/17 IMPRESSION: 1. The nodules are stable from 06/26/2013. More spiculated nodules inferomedially in the right middle lobe were not previously positive on PET scan and accordingly are likely benign. The 5 by 6 mm nodule is also stable over the past 2 years. This 2 year stability is compatible  with benign process, and based on current guidelines, further follow up surveillance is not required. 2. 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 Recommend annual imaging followup by CTA or MRA. This recommendation follows 2010 ACCF/AHA/AATS/ACR/ASA/SCA/SCAI/SIR/STS/SVM Guidelines for the Diagnosis and Management of Patients with Thoracic Aortic Disease. Circulation. 2010; 121: E831-D176 3. Coronary, aortic arch, and branch vessel atherosclerotic vascular disease.   LHC 1/15 LM No significant disease.   LAD: Luminal irregularities.   LCx: serial 50% and 60% stenoses in the mid LCx.   RCA: 50% proximal RCA stenosis.   LVEF is estimated at 55% without significant wall motion abnormalities noted in RAO projection.   Final Conclusions:   I suspect that at this point COPD is the major cause of his dyspnea.    Echo 12/14 Inferior and distal septal HK, mild LVH, EF 50-55%, mild MR, mild LAE  Labs/Other Tests and Data Reviewed:    EKG:  An ECG dated 08/16/2018 was personally reviewed today and demonstrated:  Normal sinus rhythm, heart rate 64, left anterior fascicular block, incomplete right bundle branch block, QTC 406, no change from prior tracing  Recent Labs: 09/13/2017: TSH 2.00 08/16/2018: ALT 17; BUN 21; Creat 1.21; Hemoglobin 15.4; Platelets 285; Potassium 4.7; Sodium 141    Recent Lipid Panel Lab Results  Component Value Date/Time   CHOL 122 06/22/2016 06:07 AM   TRIG 220 (H) 06/22/2016 06:07 AM   HDL 42 06/22/2016 06:07 AM   CHOLHDL 2.9 06/22/2016 06:07 AM   LDLCALC 36 06/22/2016 06:07 AM   LDLDIRECT 87.0 02/01/2014 10:19 AM    Wt Readings from Last 3 Encounters:  08/18/18 217 lb (98.4 kg)  08/16/18 217 lb (98.4 kg)  03/10/18 227 lb (103 kg)     Objective:    Vital Signs:  Pulse 62    Ht 5\' 10"  (1.778 m)    Wt 217 lb (98.4 kg)    SpO2 93%    BMI 31.14 kg/m    VITAL SIGNS:  reviewed GEN:  no acute distress EYES:  sclerae anicteric, EOMI - Extraocular Movements Intact RESPIRATORY:  Normal respiratory effort NEURO:  alert and oriented x 3, no obvious focal deficit PSYCH:  normal affect  ASSESSMENT & PLAN:    1. Preoperative cardiovascular examination The Revised Cardiac Risk Index indicates that his Perioperative Risk of Major Cardiac Event is high at 6.6%.  However, his Functional Capacity in METs is good at 4.4 as indicated by the [facility] Activity Status Index (DASI).  According to ACC/AHA guidelines, no further cardiovascular testing needed.  The patient may proceed to surgery at acceptable risk.     He does have a history of multiple prior strokes.  He is currently on long-term anticoagulation with Eliquis.  If Eliquis must be held for his procedure, in light of his prior history, he should not hold Eliquis for more than 1 day.  Eliquis should be resumed as soon as possible postoperatively to reduce his risk.    2. Coronary artery disease involving native coronary artery of native heart without angina pectoris Nonobstructive coronary artery disease by cardiac catheterization in 2015.  He has not had any anginal symptoms.  He is not on aspirin as he is on Apixaban (Eliquis).  Continue statin therapy.  Arrange follow-up fasting lipid panel prior to next appointment.  3. Chronic diastolic CHF (congestive heart failure) ([place]) EF 55-60 by  echocardiogram April 2018.  NYHA 2.  Volume status seems stable.  Continue current therapy.  4. Thoracic aortic aneurysm  without rupture (HCC) 4.1 cm by CT scan June 2019.  He is overdue for follow-up.  We will arrange follow-up CT prior to next appointment in 6 months.  5. History of stroke He is on long-term anticoagulation therapy.  He has not had a recurrent stroke since starting on Eliquis.  As noted, if Eliquis needs to be held for his procedure, it should not be held more than 1 day and resume postoperatively as soon as possible.  6. Essential hypertension Blood pressure elevated.  It was recently closer to target when he saw primary care.  He does have significant pain with his thumb arthritis.  Continue to monitor for now.  7. COVID-19 Education: The signs and symptoms of COVID-19 were discussed with the patient and how to seek care for testing (follow up with PCP or arrange E-visit).  The importance of social distancing was discussed today.  Time:   Today, I have spent 14 minutes with the patient with telehealth technology discussing the above problems.     Medication Adjustments/Labs and Tests Ordered: Current medicines are reviewed at length with the patient today.  Concerns regarding medicines are outlined above.   Tests Ordered: Orders Placed This Encounter  Procedures   CT ANGIO CHEST AORTA W &/OR WO CONTRAST   Comprehensive metabolic panel   Lipid panel    Medication Changes: No orders of the defined types were placed in this encounter.   Follow Up:  In Person in 6 month(s) with Richardson Dopp, PA-C.  Due to Dr. Darnelle Bos move to [facility], I will follow him at our [facility] clinic along with Dr. Burt Knack.    Signed, Richardson Dopp, PA-C  08/18/2018 2:08 PM     [facility]

## 2018-08-17 NOTE — Telephone Encounter (Signed)
MEDS REVIEWED   Virtual Visit Pre-Appointment Phone Call  "(Name), I am calling you today to discuss your upcoming appointment. We are currently trying to limit exposure to the virus that causes COVID-19 by seeing patients at home rather than in the office."  1. "What is the BEST phone number to call the day of the visit?" - include this in appointment notes  2. "Do you have or have access to (through a family member/friend) a smartphone with video capability that we can use for your visit?" a. If yes - list this number in appt notes as "cell" (if different from BEST phone #) and list the appointment type as a VIDEO visit in appointment notes b. If no - list the appointment type as a PHONE visit in appointment notes  3. Confirm consent - "In the setting of the current Covid19 crisis, you are scheduled for a (phone or video) visit with your provider on (date) at (time).  Just as we do with many in-office visits, in order for you to participate in this visit, we must obtain consent.  If you'd like, I can send this to your mychart (if signed up) or email for you to review.  Otherwise, I can obtain your verbal consent now.  All virtual visits are billed to your insurance company just like a normal visit would be.  By agreeing to a virtual visit, we'd like you to understand that the technology does not allow for your provider to perform an examination, and thus may limit your provider's ability to fully assess your condition. If your provider identifies any concerns that need to be evaluated in person, we will make arrangements to do so.  Finally, though the technology is pretty good, we cannot assure that it will always work on either your or our end, and in the setting of a video visit, we may have to convert it to a phone-only visit.  In either situation, we cannot ensure that we have a secure connection.  Are you willing to proceed?" STAFF: Did the patient verbally acknowledge consent to telehealth  visit? Document YES/NO here: YES  4. Advise patient to be prepared - "Two hours prior to your appointment, go ahead and check your blood pressure, pulse, oxygen saturation, and your weight (if you have the equipment to check those) and write them all down. When your visit starts, your provider will ask you for this information. If you have an Apple Watch or Kardia device, please plan to have heart rate information ready on the day of your appointment. Please have a pen and paper handy nearby the day of the visit as well."  5. Give patient instructions for MyChart download to smartphone OR Doximity/Doxy.me as below if video visit (depending on what platform provider is using)  6. Inform patient they will receive a phone call 15 minutes prior to their appointment time (may be from unknown caller ID) so they should be prepared to answer    TELEPHONE CALL NOTE  [name] [name] has been deemed a candidate for a follow-up tele-health visit to limit community exposure during the Covid-19 pandemic. I spoke with the patient via phone to ensure availability of phone/video source, confirm preferred email & phone number, and discuss instructions and expectations.  I reminded [name] [name] to be prepared with any vital sign and/or heart rhythm information that could potentially be obtained via home monitoring, at the time of his visit. I reminded [name] [name] to expect a phone  call prior to his visit.  [name] [name], CMA 08/17/2018 3:12 PM   INSTRUCTIONS FOR DOWNLOADING THE MYCHART APP TO SMARTPHONE  - The patient must first make sure to have activated MyChart and know their login information - If Apple, go to [employer] and type in MyChart in the search bar and download the app. If Android, ask patient to go to [employer] and type in [employer] in the search bar and download the app. The app is free but as with any other app downloads, their phone may require them to verify saved payment  information or Apple/Android password.  - The patient will need to then log into the app with their MyChart username and password, and select [facility] as their healthcare provider to link the account. When it is time for your visit, go to the MyChart app, find appointments, and click Begin Video Visit. Be sure to Select Allow for your device to access the Microphone and Camera for your visit. You will then be connected, and your provider will be with you shortly.  **If they have any issues connecting, or need assistance please contact MyChart service desk [phone])**  **If using a computer, in order to ensure the best quality for their visit they will need to use either of the following Internet Browsers: [employer], or Google Chrome**  IF USING DOXIMITY or DOXY.ME - The patient will receive a link just prior to their visit by text.     FULL LENGTH CONSENT FOR TELE-HEALTH VISIT   I hereby voluntarily request, consent and authorize [facility] and its employed or contracted physicians, physician assistants, nurse practitioners or other licensed health care professionals (the Practitioner), to provide me with telemedicine health care services (the "Services") as deemed necessary by the treating Practitioner. I acknowledge and consent to receive the Services by the Practitioner via telemedicine. I understand that the telemedicine visit will involve communicating with the Practitioner through live audiovisual communication technology and the disclosure of certain medical information by electronic transmission. I acknowledge that I have been given the opportunity to request an in-person assessment or other available alternative prior to the telemedicine visit and am voluntarily participating in the telemedicine visit.  I understand that I have the right to withhold or withdraw my consent to the use of telemedicine in the course of my care at any time, without affecting my right  to future care or treatment, and that the Practitioner or I may terminate the telemedicine visit at any time. I understand that I have the right to inspect all information obtained and/or recorded in the course of the telemedicine visit and may receive copies of available information for a reasonable fee.  I understand that some of the potential risks of receiving the Services via telemedicine include:  Marland Kitchen Delay or interruption in medical evaluation due to technological equipment failure or disruption; . Information transmitted may not be sufficient (e.g. poor resolution of images) to allow for appropriate medical decision making by the Practitioner; and/or  . In rare instances, security protocols could fail, causing a breach of personal health information.  Furthermore, I acknowledge that it is my responsibility to provide information about my medical history, conditions and care that is complete and accurate to the best of my ability. I acknowledge that Practitioner's advice, recommendations, and/or decision may be based on factors not within their control, such as incomplete or inaccurate data provided by me or distortions of diagnostic images or specimens that may result  from electronic transmissions. I understand that the practice of medicine is not an exact science and that Practitioner makes no warranties or guarantees regarding treatment outcomes. I acknowledge that I will receive a copy of this consent concurrently upon execution via email to the email address I last provided but may also request a printed copy by calling the office of [facility].    I understand that my insurance will be billed for this visit.   I have read or had this consent read to me. . I understand the contents of this consent, which adequately explains the benefits and risks of the Services being provided via telemedicine.  . I have been provided ample opportunity to ask questions regarding this consent and the Services  and have had my questions answered to my satisfaction. . I give my informed consent for the services to be provided through the use of telemedicine in my medical care  By participating in this telemedicine visit I agree to the above.

## 2018-08-17 NOTE — Telephone Encounter (Signed)
2nd attempt to reach pt to make appointment for clearance

## 2018-08-18 ENCOUNTER — Encounter: Payer: Self-pay | Admitting: Family Medicine

## 2018-08-18 ENCOUNTER — Telehealth (INDEPENDENT_AMBULATORY_CARE_PROVIDER_SITE_OTHER): Payer: Medicare Other | Admitting: Physician Assistant

## 2018-08-18 ENCOUNTER — Encounter: Payer: Self-pay | Admitting: Physician Assistant

## 2018-08-18 ENCOUNTER — Other Ambulatory Visit: Payer: Self-pay

## 2018-08-18 ENCOUNTER — Other Ambulatory Visit: Payer: Self-pay | Admitting: Family Medicine

## 2018-08-18 VITALS — HR 62 | Ht 70.0 in | Wt 217.0 lb

## 2018-08-18 DIAGNOSIS — Z7901 Long term (current) use of anticoagulants: Secondary | ICD-10-CM | POA: Diagnosis not present

## 2018-08-18 DIAGNOSIS — Z8673 Personal history of transient ischemic attack (TIA), and cerebral infarction without residual deficits: Secondary | ICD-10-CM | POA: Diagnosis not present

## 2018-08-18 DIAGNOSIS — I10 Essential (primary) hypertension: Secondary | ICD-10-CM

## 2018-08-18 DIAGNOSIS — I712 Thoracic aortic aneurysm, without rupture, unspecified: Secondary | ICD-10-CM

## 2018-08-18 DIAGNOSIS — I2510 Atherosclerotic heart disease of native coronary artery without angina pectoris: Secondary | ICD-10-CM | POA: Diagnosis not present

## 2018-08-18 DIAGNOSIS — Z7189 Other specified counseling: Secondary | ICD-10-CM

## 2018-08-18 DIAGNOSIS — I5032 Chronic diastolic (congestive) heart failure: Secondary | ICD-10-CM

## 2018-08-18 DIAGNOSIS — I110 Hypertensive heart disease with heart failure: Secondary | ICD-10-CM | POA: Diagnosis not present

## 2018-08-18 DIAGNOSIS — Z01810 Encounter for preprocedural cardiovascular examination: Secondary | ICD-10-CM

## 2018-08-18 NOTE — Patient Instructions (Signed)
Medication Instructions:  Your physician recommends that you continue on your current medications as directed. Please refer to the Current Medication list given to you today.  If you need a refill on your cardiac medications before your next appointment, please call your pharmacy.   Lab work: TO BE DONE IN 6 MONTHS; 1-2 WEEK PRIOR TO OFFICE VISIT: CMET, LIPIDS  If you have labs (blood work) drawn today and your tests are completely normal, you will receive your results only by: Marland Kitchen MyChart Message (if you have MyChart) OR . A paper copy in the mail If you have any lab test that is abnormal or we need to change your treatment, we will call you to review the results.  Testing/Procedures: Non-Cardiac CT Angiography (CTA), is a special type of CT scan that uses a computer to produce multi-dimensional views of major blood vessels throughout the body. In CT angiography, a contrast material is injected through an IV to help visualize the blood vessels  Follow-Up: At [facility], you and your health needs are our priority.  As part of our continuing mission to provide you with exceptional heart care, we have created designated Provider Care Teams.  These Care Teams include your primary Cardiologist (physician) and Advanced Practice Providers (APPs -  Physician Assistants and Nurse Practitioners) who all work together to provide you with the care you need, when you need it. . Your physician recommends that you schedule a follow-up appointment in: [place], PA-C   Any Other Special Instructions Will Be Listed Below (If Applicable).

## 2018-08-21 ENCOUNTER — Encounter: Payer: Self-pay | Admitting: Physician Assistant

## 2018-08-21 ENCOUNTER — Other Ambulatory Visit: Payer: Self-pay | Admitting: *Deleted

## 2018-08-21 MED ORDER — DOXAZOSIN MESYLATE 4 MG PO TABS: 4.0000 mg | ORAL_TABLET | Freq: Every day | ORAL | 3 refills | Status: DC

## 2018-08-22 ENCOUNTER — Other Ambulatory Visit: Payer: Self-pay | Admitting: Family Medicine

## 2018-08-22 MED ORDER — HYDROCODONE-ACETAMINOPHEN 10-325 MG PO TABS: 1.0000 | ORAL_TABLET | Freq: Four times a day (QID) | ORAL | 0 refills | Status: DC | PRN

## 2018-09-04 ENCOUNTER — Encounter (HOSPITAL_COMMUNITY): Payer: Self-pay

## 2018-09-04 ENCOUNTER — Other Ambulatory Visit: Payer: Self-pay

## 2018-09-04 ENCOUNTER — Other Ambulatory Visit (HOSPITAL_COMMUNITY)
Admission: RE | Admit: 2018-09-04 | Discharge: 2018-09-04 | Disposition: A | Discharge status: Home / Self Care | Payer: Medicare Other | Source: Ambulatory Visit | Attending: Orthopaedic Surgery | Admitting: Orthopaedic Surgery

## 2018-09-04 ENCOUNTER — Encounter (HOSPITAL_COMMUNITY)
Admission: RE | Admit: 2018-09-04 | Discharge: 2018-09-04 | Disposition: A | Discharge status: Home / Self Care | Payer: Medicare Other | Source: Ambulatory Visit | Attending: Orthopaedic Surgery | Admitting: Orthopaedic Surgery

## 2018-09-04 DIAGNOSIS — N189 Chronic kidney disease, unspecified: Secondary | ICD-10-CM | POA: Diagnosis not present

## 2018-09-04 DIAGNOSIS — Z79899 Other long term (current) drug therapy: Secondary | ICD-10-CM | POA: Diagnosis not present

## 2018-09-04 DIAGNOSIS — M189 Osteoarthritis of first carpometacarpal joint, unspecified: Secondary | ICD-10-CM | POA: Diagnosis not present

## 2018-09-04 DIAGNOSIS — Z96653 Presence of artificial knee joint, bilateral: Secondary | ICD-10-CM | POA: Diagnosis not present

## 2018-09-04 DIAGNOSIS — E785 Hyperlipidemia, unspecified: Secondary | ICD-10-CM | POA: Diagnosis not present

## 2018-09-04 DIAGNOSIS — I712 Thoracic aortic aneurysm, without rupture: Secondary | ICD-10-CM | POA: Diagnosis not present

## 2018-09-04 DIAGNOSIS — G4730 Sleep apnea, unspecified: Secondary | ICD-10-CM | POA: Diagnosis not present

## 2018-09-04 DIAGNOSIS — I2510 Atherosclerotic heart disease of native coronary artery without angina pectoris: Secondary | ICD-10-CM | POA: Diagnosis not present

## 2018-09-04 DIAGNOSIS — I69311 Memory deficit following cerebral infarction: Secondary | ICD-10-CM | POA: Diagnosis not present

## 2018-09-04 DIAGNOSIS — Z7901 Long term (current) use of anticoagulants: Secondary | ICD-10-CM | POA: Diagnosis not present

## 2018-09-04 DIAGNOSIS — I5032 Chronic diastolic (congestive) heart failure: Secondary | ICD-10-CM | POA: Diagnosis not present

## 2018-09-04 DIAGNOSIS — Z8551 Personal history of malignant neoplasm of bladder: Secondary | ICD-10-CM | POA: Diagnosis not present

## 2018-09-04 DIAGNOSIS — F0390 Unspecified dementia without behavioral disturbance: Secondary | ICD-10-CM | POA: Diagnosis not present

## 2018-09-04 DIAGNOSIS — I7300 Raynaud's syndrome without gangrene: Secondary | ICD-10-CM | POA: Diagnosis not present

## 2018-09-04 DIAGNOSIS — Z01812 Encounter for preprocedural laboratory examination: Secondary | ICD-10-CM | POA: Insufficient documentation

## 2018-09-04 DIAGNOSIS — I130 Hypertensive heart and chronic kidney disease with heart failure and stage 1 through stage 4 chronic kidney disease, or unspecified chronic kidney disease: Secondary | ICD-10-CM | POA: Diagnosis not present

## 2018-09-04 DIAGNOSIS — F17290 Nicotine dependence, other tobacco product, uncomplicated: Secondary | ICD-10-CM | POA: Diagnosis not present

## 2018-09-04 DIAGNOSIS — J449 Chronic obstructive pulmonary disease, unspecified: Secondary | ICD-10-CM | POA: Diagnosis not present

## 2018-09-04 DIAGNOSIS — G4733 Obstructive sleep apnea (adult) (pediatric): Secondary | ICD-10-CM | POA: Diagnosis not present

## 2018-09-04 DIAGNOSIS — G35 Multiple sclerosis: Secondary | ICD-10-CM | POA: Diagnosis not present

## 2018-09-04 HISTORY — DX: Raynaud's syndrome without gangrene: I73.00

## 2018-09-04 HISTORY — DX: Bipolar disorder, unspecified: F31.9

## 2018-09-04 HISTORY — DX: Anxiety disorder, unspecified: F41.9

## 2018-09-04 LAB — CBC
HCT: 46.7 % (ref 39.0–52.0)
Hemoglobin: 15 g/dL (ref 13.0–17.0)
MCH: 32.7 pg (ref 26.0–34.0)
MCHC: 32.1 g/dL (ref 30.0–36.0)
MCV: 101.7 fL — ABNORMAL HIGH (ref 80.0–100.0)
Platelets: 246 10*3/uL (ref 150–400)
RBC: 4.59 MIL/uL (ref 4.22–5.81)
RDW: 14.5 % (ref 11.5–15.5)
WBC: 7.5 10*3/uL (ref 4.0–10.5)
nRBC: 0 % (ref 0.0–0.2)

## 2018-09-04 LAB — SURGICAL PCR SCREEN
MRSA, PCR: NEGATIVE
Staphylococcus aureus: NEGATIVE

## 2018-09-04 LAB — BASIC METABOLIC PANEL
Anion gap: 9 (ref 5–15)
BUN: 21 mg/dL (ref 8–23)
CO2: 25 mmol/L (ref 22–32)
Calcium: 8.8 mg/dL — ABNORMAL LOW (ref 8.9–10.3)
Chloride: 103 mmol/L (ref 98–111)
Creatinine, Ser: 1.13 mg/dL (ref 0.61–1.24)
GFR calc Af Amer: >60 mL/min (ref >60)
GFR calc non Af Amer: >60 mL/min (ref >60)
Glucose, Bld: 98 mg/dL (ref 70–99)
Potassium: 4.5 mmol/L (ref 3.5–5.1)
Sodium: 137 mmol/L (ref 135–145)

## 2018-09-04 LAB — SARS CORONAVIRUS 2 (TAT 6-24 HRS): SARS Coronavirus 2: NEGATIVE

## 2018-09-04 NOTE — Pre-Procedure Instructions (Signed)
[name] [name]  09/04/2018     Your procedure is scheduled on Thursday, September 3.  Report to [facility], Main Entrance or Entrance "A" at 8:00 AM               Your surgery or procedure is scheduled for  10: 00 .A.M.   Call this number if you have problems the morning of surgery:[phone]  This is the number for the Pre- Surgical Desk.         For any other questions, please call [phone], Monday - Friday 8 AM - 4 PM.   Remember:  Do not eat after midnight Wednesday, September 2.  You may drink clear liquids until 7:00 AM .  Clear liquids allowed are:   Water, Juice (non-citric and without pulp), Carbonated beverages, Clear Tea, Black Coffee only, Plain Jell-O only, Gatorade and Plain Popsicles only  Drink the Pre- Surgey Ensure between 7:30 ans 8:00AM   Take these medicines the morning of surgery with A SIP OF WATER:   doxazosin (CARDURA)              gabapentin (NEURONTIN)             metoprolol succinate (TOPROL-XL)             rOPINIRole (REQUIP)             iotropium Bromide-Olodaterol (STIOLTO RESPIMAT)  Inhaler             Venlafaxine  Take if needed:             acetaminophen (TYLENOL)  Follow your surgeon's instructions regarding ELIQUIS.         STOP/ DoNot start taking Aspirin, Aspirin Products (Goody Powder, Excedrin Migraine), Ibuprofen (Advil), Naproxen (Aleve), Vitamins and Herbal Products (ie Fish Oil).               NO SMOKING 24 hours Prior to Surgery [facility]- Preparing For Surgery  Before surgery, you can play an important role. Because skin is not sterile, your skin needs to be as free of germs as possible. You can reduce the number of germs on your skin by washing with CHG (chlorahexidine gluconate) Soap before surgery.  CHG is an antiseptic cleaner which kills germs and bonds with the skin to continue killing germs even after washing.    Oral Hygiene is also important to reduce your risk of infection.  Remember - BRUSH YOUR  TEETH THE MORNING OF SURGERY WITH YOUR REGULAR TOOTHPASTE  Please do not use if you have an allergy to CHG or antibacterial soaps. If your skin becomes reddened/irritated stop using the CHG.  Do not shave (including legs and underarms) for at least 48 hours prior to first CHG shower. It is OK to shave your face.  Please follow these instructions carefully.   1. Shower the NIGHT BEFORE SURGERY and the MORNING OF SURGERY with CHG.   2. If you chose to wash your hair, wash your hair first as usual with your normal shampoo.  3. After you shampoo, wash your face and private area with the soap you use at home, then rinse your hair and body thoroughly to remove the shampoo and soap.  4. Use CHG as you would any other liquid soap. You can apply CHG directly to the skin and wash gently with a scrungie or a clean washcloth.   5. Apply the CHG Soap to your body ONLY FROM THE NECK DOWN.  Do  not use on open wounds or open sores. Avoid contact with your eyes, ears, mouth and genitals (private parts).    6. Wash thoroughly, paying special attention to the area where your surgery will be performed.  7. Thoroughly rinse your body with warm water from the neck down.  8. DO NOT shower/wash with your normal soap after using and rinsing off the CHG Soap.  9. Pat yourself dry with a CLEAN TOWEL.  10. Wear CLEAN PAJAMAS to bed the night before surgery, wear comfortable clothes the morning of surgery  11. Place CLEAN SHEETS on your bed the night of your first shower and DO NOT SLEEP WITH PETS.    Day of Surgery: Do not wear lotions, powders, or perfumes, or deodorant. Please wear clean clothes to the hospital/surgery center.   Remember to brush your teeth WITH YOUR REGULAR TOOTHPASTE. Men may shave face and neck.  Do not bring valuables to the hospital.  [facility] is not responsible for any belongings or valuables.  Contacts, dentures or bridgework may not be worn into surgery.  Leave your suitcase  in the car.  After surgery it may be brought to your room.  For patients admitted to the hospital, discharge time will be determined by your treatment team.  Patients discharged the day of surgery will not be allowed to drive home.   Please read over the following fact sheets that you were given. Pain Booklet, Coughing and Deep Breathing., Surgical Site Infections.

## 2018-09-04 NOTE — Progress Notes (Addendum)
PCP - Dr. Dennard Schaumann.  Cardiologist - Dr.Cooper-    Chest x-ray - 11/1 Sees a Neurologist at [facility] for [name] and has seen [facility] Neurology in the past.  Stress Test - no  ECHO - -04/2016  Cardiac Cath - 2015  LOOP-2017- wife states that it will not download   Sleep Study - no CPAP - no  LABS-  Eliquis hold for 1 day.  ERAS-yes  HA1C-na Fasting Blood Sugar - na Checks Blood Sugar _0 times a day  Anesthesia- Has cardiac clearance  Pt denies having chest pain, sob, or fever at this time. All instructions explained to the pt, with a verbal understanding of the material. Pt agrees to go over the instructions while at home for a better understanding. Pt also instructed to self quarantine after being tested for COVID-19. The opportunity to ask questions was provided.  [name] [name] has dementia and has had stokes.  Patient is very hard of hearing. Patient is pleasant and follows instructions well.  I had to have [name] [name] in [place] appointment and she will need to be in Pre- Surgery to answer questions.

## 2018-09-05 ENCOUNTER — Ambulatory Visit (INDEPENDENT_AMBULATORY_CARE_PROVIDER_SITE_OTHER): Payer: Medicare Other

## 2018-09-05 ENCOUNTER — Other Ambulatory Visit: Payer: Self-pay

## 2018-09-05 DIAGNOSIS — Z23 Encounter for immunization: Secondary | ICD-10-CM

## 2018-09-05 NOTE — Progress Notes (Signed)
Patient came in to receive his annual flu vaccine. He was given fluad in the left deltoid. Patient tolerated well. VIS given.

## 2018-09-07 ENCOUNTER — Ambulatory Visit (HOSPITAL_COMMUNITY)
Admission: RE | Admit: 2018-09-07 | Discharge: 2018-09-07 | Disposition: A | Discharge status: Home / Self Care | Payer: Medicare Other | Attending: Orthopaedic Surgery | Admitting: Orthopaedic Surgery

## 2018-09-07 ENCOUNTER — Ambulatory Visit (HOSPITAL_COMMUNITY): Payer: Medicare Other | Admitting: Anesthesiology

## 2018-09-07 ENCOUNTER — Other Ambulatory Visit: Payer: Self-pay

## 2018-09-07 ENCOUNTER — Encounter (HOSPITAL_COMMUNITY): Payer: Self-pay

## 2018-09-07 ENCOUNTER — Encounter (HOSPITAL_COMMUNITY)
Admission: RE | Disposition: A | Discharge status: Home / Self Care | Payer: Self-pay | Source: Home / Self Care | Attending: Orthopaedic Surgery

## 2018-09-07 DIAGNOSIS — G35 Multiple sclerosis: Secondary | ICD-10-CM | POA: Insufficient documentation

## 2018-09-07 DIAGNOSIS — M189 Osteoarthritis of first carpometacarpal joint, unspecified: Secondary | ICD-10-CM | POA: Insufficient documentation

## 2018-09-07 DIAGNOSIS — I69311 Memory deficit following cerebral infarction: Secondary | ICD-10-CM | POA: Diagnosis not present

## 2018-09-07 DIAGNOSIS — Z96653 Presence of artificial knee joint, bilateral: Secondary | ICD-10-CM | POA: Insufficient documentation

## 2018-09-07 DIAGNOSIS — F0390 Unspecified dementia without behavioral disturbance: Secondary | ICD-10-CM | POA: Diagnosis not present

## 2018-09-07 DIAGNOSIS — F17290 Nicotine dependence, other tobacco product, uncomplicated: Secondary | ICD-10-CM | POA: Insufficient documentation

## 2018-09-07 DIAGNOSIS — I130 Hypertensive heart and chronic kidney disease with heart failure and stage 1 through stage 4 chronic kidney disease, or unspecified chronic kidney disease: Secondary | ICD-10-CM | POA: Diagnosis not present

## 2018-09-07 DIAGNOSIS — G4733 Obstructive sleep apnea (adult) (pediatric): Secondary | ICD-10-CM | POA: Insufficient documentation

## 2018-09-07 DIAGNOSIS — G4730 Sleep apnea, unspecified: Secondary | ICD-10-CM | POA: Insufficient documentation

## 2018-09-07 DIAGNOSIS — N189 Chronic kidney disease, unspecified: Secondary | ICD-10-CM | POA: Diagnosis not present

## 2018-09-07 DIAGNOSIS — I5032 Chronic diastolic (congestive) heart failure: Secondary | ICD-10-CM | POA: Diagnosis not present

## 2018-09-07 DIAGNOSIS — I2510 Atherosclerotic heart disease of native coronary artery without angina pectoris: Secondary | ICD-10-CM | POA: Insufficient documentation

## 2018-09-07 DIAGNOSIS — Z8551 Personal history of malignant neoplasm of bladder: Secondary | ICD-10-CM | POA: Insufficient documentation

## 2018-09-07 DIAGNOSIS — J449 Chronic obstructive pulmonary disease, unspecified: Secondary | ICD-10-CM | POA: Insufficient documentation

## 2018-09-07 DIAGNOSIS — I712 Thoracic aortic aneurysm, without rupture: Secondary | ICD-10-CM | POA: Insufficient documentation

## 2018-09-07 DIAGNOSIS — Z79899 Other long term (current) drug therapy: Secondary | ICD-10-CM | POA: Insufficient documentation

## 2018-09-07 DIAGNOSIS — M1812 Unilateral primary osteoarthritis of first carpometacarpal joint, left hand: Secondary | ICD-10-CM | POA: Diagnosis not present

## 2018-09-07 DIAGNOSIS — E785 Hyperlipidemia, unspecified: Secondary | ICD-10-CM | POA: Diagnosis not present

## 2018-09-07 DIAGNOSIS — I7300 Raynaud's syndrome without gangrene: Secondary | ICD-10-CM | POA: Diagnosis not present

## 2018-09-07 DIAGNOSIS — Z7901 Long term (current) use of anticoagulants: Secondary | ICD-10-CM | POA: Insufficient documentation

## 2018-09-07 DIAGNOSIS — I110 Hypertensive heart disease with heart failure: Secondary | ICD-10-CM | POA: Diagnosis not present

## 2018-09-07 DIAGNOSIS — M19032 Primary osteoarthritis, left wrist: Secondary | ICD-10-CM | POA: Diagnosis not present

## 2018-09-07 HISTORY — PX: CARPOMETACARPAL (CMC) FUSION OF THUMB: SHX6290

## 2018-09-07 LAB — PROTIME-INR
INR: 1 (ref 0.8–1.2)
Prothrombin Time: 13.3 seconds (ref 11.4–15.2)

## 2018-09-07 SURGERY — CARPOMETACARPAL (CMC) FUSION OF THUMB
Anesthesia: Monitor Anesthesia Care | Laterality: Left

## 2018-09-07 MED ORDER — HYDROMORPHONE HCL 1 MG/ML IJ SOLN: 0.2500 mg | INTRAMUSCULAR | Status: DC | PRN

## 2018-09-07 MED ORDER — CEFAZOLIN SODIUM-DEXTROSE 2-4 GM/100ML-% IV SOLN
2.0000 g | INTRAVENOUS | Status: AC
  Administered 2018-09-07: 11:00:00 2 g via INTRAVENOUS

## 2018-09-07 MED ORDER — PROPOFOL 10 MG/ML IV BOLUS
INTRAVENOUS | Status: AC
  Filled 2018-09-07: qty 20

## 2018-09-07 MED ORDER — PROPOFOL 500 MG/50ML IV EMUL
INTRAVENOUS | Status: DC | PRN
  Administered 2018-09-07: 25 ug/kg/min via INTRAVENOUS

## 2018-09-07 MED ORDER — FENTANYL CITRATE (PF) 250 MCG/5ML IJ SOLN
INTRAMUSCULAR | Status: AC
  Filled 2018-09-07: qty 5

## 2018-09-07 MED ORDER — HYDROCODONE-ACETAMINOPHEN 10-325 MG PO TABS: 1.0000 | ORAL_TABLET | Freq: Four times a day (QID) | ORAL | 0 refills | Status: DC | PRN

## 2018-09-07 MED ORDER — FENTANYL CITRATE (PF) 100 MCG/2ML IJ SOLN
INTRAMUSCULAR | Status: AC
  Administered 2018-09-07: 50 ug via INTRAVENOUS
  Filled 2018-09-07: qty 2

## 2018-09-07 MED ORDER — BUPIVACAINE-EPINEPHRINE (PF) 0.5% -1:200000 IJ SOLN
INTRAMUSCULAR | Status: DC | PRN
  Administered 2018-09-07: 25 mL via PERINEURAL

## 2018-09-07 MED ORDER — MIDAZOLAM HCL 2 MG/2ML IJ SOLN
INTRAMUSCULAR | Status: AC
  Filled 2018-09-07: qty 2

## 2018-09-07 MED ORDER — CHLORHEXIDINE GLUCONATE 4 % EX LIQD: 60.0000 mL | Freq: Once | CUTANEOUS | Status: DC

## 2018-09-07 MED ORDER — SODIUM CHLORIDE 0.9 % IV SOLN
INTRAVENOUS | Status: DC | PRN
  Administered 2018-09-07: 35 ug/min via INTRAVENOUS

## 2018-09-07 MED ORDER — CEFAZOLIN SODIUM-DEXTROSE 2-4 GM/100ML-% IV SOLN
INTRAVENOUS | Status: AC
  Filled 2018-09-07: qty 100

## 2018-09-07 MED ORDER — LACTATED RINGERS IV SOLN
INTRAVENOUS | Status: DC | PRN
  Administered 2018-09-07: 09:00:00 via INTRAVENOUS

## 2018-09-07 MED ORDER — FENTANYL CITRATE (PF) 100 MCG/2ML IJ SOLN
50.0000 ug | Freq: Once | INTRAMUSCULAR | Status: AC
  Administered 2018-09-07: 10:00:00 50 ug via INTRAVENOUS
  Filled 2018-09-07: qty 1

## 2018-09-07 MED ORDER — 0.9 % SODIUM CHLORIDE (POUR BTL) OPTIME
TOPICAL | Status: DC | PRN
  Administered 2018-09-07: 1000 mL

## 2018-09-07 MED ORDER — ACETAMINOPHEN 500 MG PO TABS
1000.0000 mg | ORAL_TABLET | Freq: Once | ORAL | Status: AC
  Administered 2018-09-07: 1000 mg via ORAL
  Filled 2018-09-07: qty 2

## 2018-09-07 MED ORDER — STERILE WATER FOR IRRIGATION IR SOLN
Status: DC | PRN
  Administered 2018-09-07: 1000 mL

## 2018-09-07 SURGICAL SUPPLY — 51 items
BLADE SURG 15 STRL LF DISP TIS (BLADE) ×2 IMPLANT
BLADE SURG 15 STRL SS (BLADE) ×4
BNDG COHESIVE 1X5 TAN STRL LF (GAUZE/BANDAGES/DRESSINGS) IMPLANT
BNDG ELASTIC 3X5.8 VLCR STR LF (GAUZE/BANDAGES/DRESSINGS) ×3 IMPLANT
BNDG ELASTIC 4X5.8 VLCR STR LF (GAUZE/BANDAGES/DRESSINGS) IMPLANT
BNDG GAUZE ELAST 4 BULKY (GAUZE/BANDAGES/DRESSINGS) ×6 IMPLANT
CORD BIPOLAR FORCEPS 12FT (ELECTRODE) ×3 IMPLANT
COVER SURGICAL LIGHT HANDLE (MISCELLANEOUS) ×3 IMPLANT
COVER WAND RF STERILE (DRAPES) ×3 IMPLANT
CUFF TOURN SGL QUICK 18X4 (TOURNIQUET CUFF) ×3 IMPLANT
CUFF TOURN SGL QUICK 24 (TOURNIQUET CUFF)
CUFF TRNQT CYL 24X4X16.5-23 (TOURNIQUET CUFF) IMPLANT
DRAPE OEC MINIVIEW 54X84 (DRAPES) IMPLANT
DRAPE SURG 17X23 STRL (DRAPES) ×3 IMPLANT
DRSG XEROFORM 1X8 (GAUZE/BANDAGES/DRESSINGS) ×3 IMPLANT
GAUZE SPONGE 2X2 8PLY STRL LF (GAUZE/BANDAGES/DRESSINGS) IMPLANT
GAUZE SPONGE 4X4 12PLY STRL (GAUZE/BANDAGES/DRESSINGS) IMPLANT
GAUZE SPONGE 4X4 16PLY XRAY LF (GAUZE/BANDAGES/DRESSINGS) ×3 IMPLANT
GAUZE XEROFORM 1X8 LF (GAUZE/BANDAGES/DRESSINGS) IMPLANT
GLOVE BIOGEL M 8.0 STRL (GLOVE) ×3 IMPLANT
GLOVE SS BIOGEL STRL SZ 8 (GLOVE) ×1 IMPLANT
GLOVE SUPERSENSE BIOGEL SZ 8 (GLOVE) ×2
GOWN STRL REUS W/ TWL LRG LVL3 (GOWN DISPOSABLE) ×2 IMPLANT
GOWN STRL REUS W/ TWL XL LVL3 (GOWN DISPOSABLE) ×3 IMPLANT
GOWN STRL REUS W/TWL LRG LVL3 (GOWN DISPOSABLE) ×4
GOWN STRL REUS W/TWL XL LVL3 (GOWN DISPOSABLE) ×6
KIT BASIN OR (CUSTOM PROCEDURE TRAY) ×3 IMPLANT
KIT TURNOVER KIT B (KITS) ×3 IMPLANT
MANIFOLD NEPTUNE II (INSTRUMENTS) ×3 IMPLANT
NEEDLE HYPO 25GX1X1/2 BEV (NEEDLE) IMPLANT
NS IRRIG 1000ML POUR BTL (IV SOLUTION) ×3 IMPLANT
PACK ORTHO EXTREMITY (CUSTOM PROCEDURE TRAY) ×3 IMPLANT
PAD ARMBOARD 7.5X6 YLW CONV (MISCELLANEOUS) ×6 IMPLANT
PAD CAST 4YDX4 CTTN HI CHSV (CAST SUPPLIES) ×1 IMPLANT
PADDING CAST COTTON 4X4 STRL (CAST SUPPLIES) ×2
SOL PREP POV-IOD 4OZ 10% (MISCELLANEOUS) ×9 IMPLANT
SPLINT PLASTER EXTRA FAST 3X15 (CAST SUPPLIES) ×2
SPLINT PLASTER GYPS XFAST 3X15 (CAST SUPPLIES) ×1 IMPLANT
SPONGE GAUZE 2X2 STER 10/PKG (GAUZE/BANDAGES/DRESSINGS)
SUCTION FRAZIER HANDLE 10FR (MISCELLANEOUS)
SUCTION TUBE FRAZIER 10FR DISP (MISCELLANEOUS) IMPLANT
SUT MERSILENE 4 0 P 3 (SUTURE) IMPLANT
SUT PROLENE 4 0 PS 2 18 (SUTURE) IMPLANT
SYR CONTROL 10ML LL (SYRINGE) IMPLANT
SYSTEM IMPLANT TIGHTROPE MINI (Anchor) ×3 IMPLANT
TOWEL GREEN STERILE (TOWEL DISPOSABLE) ×3 IMPLANT
TOWEL GREEN STERILE FF (TOWEL DISPOSABLE) ×3 IMPLANT
TUBE CONNECTING 12'X1/4 (SUCTIONS)
TUBE CONNECTING 12X1/4 (SUCTIONS) IMPLANT
UNDERPAD 30X30 (UNDERPADS AND DIAPERS) ×3 IMPLANT
WATER STERILE IRR 1000ML POUR (IV SOLUTION) ×3 IMPLANT

## 2018-09-07 NOTE — Progress Notes (Signed)
Orthopedic Tech Progress Note Patient Details:  JIMIE WISELY 12-31-1940 [id number] PACU RN called requesting arm sling for patient Ortho Devices Type of Ortho Device: Arm sling Ortho Device/Splint Location: ULE Ortho Device/Splint Interventions: Adjustment, Application, Ordered   Post Interventions Patient Tolerated: Well Instructions Provided: Care of device, Adjustment of device   Janit Pagan 09/07/2018, 12:53 PM

## 2018-09-07 NOTE — Anesthesia Preprocedure Evaluation (Addendum)
Anesthesia Evaluation  Patient identified by MRN, date of birth, ID band Patient awake    Reviewed: Allergy & Precautions, H&P , NPO status , Patient's Chart, lab work & pertinent test results, reviewed documented beta blocker date and time   Airway Mallampati: III  TM Distance: >3 FB Neck ROM: Full    Dental no notable dental hx. (+) Teeth Intact, Dental Advisory Given   Pulmonary sleep apnea , COPD,  COPD inhaler, Current Smoker and Patient abstained from smoking.,    Pulmonary exam normal breath sounds clear to auscultation       Cardiovascular hypertension, Pt. on medications and Pt. on home beta blockers + CAD, +CHF and + DOE   Rhythm:Regular Rate:Normal     Neuro/Psych  Headaches, Anxiety Depression Bipolar Disorder Dementia CVA    GI/Hepatic Neg liver ROS, GERD  ,  Endo/Other  negative endocrine ROS  Renal/GU Renal InsufficiencyRenal disease  negative genitourinary   Musculoskeletal  (+) Arthritis , Osteoarthritis,    Abdominal   Peds  Hematology negative hematology ROS (+)   Anesthesia Other Findings   Reproductive/Obstetrics negative OB ROS                            Anesthesia Physical Anesthesia Plan  ASA: III  Anesthesia Plan: MAC and Regional   Post-op Pain Management:    Induction: Intravenous  PONV Risk Score and Plan: 0 and Propofol infusion  Airway Management Planned: Simple Face Mask  Additional Equipment:   Intra-op Plan:   Post-operative Plan:   Informed Consent: I have reviewed the patients History and Physical, chart, labs and discussed the procedure including the risks, benefits and alternatives for the proposed anesthesia with the patient or authorized representative who has indicated his/her understanding and acceptance.   Patient has DNR.  Discussed DNR with patient, Continue DNR and Discussed DNR with power of attorney.   Dental advisory  given  Plan Discussed with: CRNA  Anesthesia Plan Comments:        Anesthesia Quick Evaluation

## 2018-09-07 NOTE — Anesthesia Postprocedure Evaluation (Signed)
Anesthesia Post Note  Patient: [name] [name]  Procedure(s) Performed: Left thumb basilar joint arthroplasty and surgery as indicated (Left )     Patient location during evaluation: PACU Anesthesia Type: Regional and MAC Level of consciousness: awake and alert Pain management: pain level controlled Vital Signs Assessment: post-procedure vital signs reviewed and stable Respiratory status: spontaneous breathing, nonlabored ventilation and respiratory function stable Cardiovascular status: stable and blood pressure returned to baseline Postop Assessment: no apparent nausea or vomiting Anesthetic complications: no    Last Vitals:  Vitals:   09/07/18 1225 09/07/18 1227  BP:  (!) 148/89  Pulse:  64  Resp:  14  Temp:    SpO2: 95% 95%    Last Pain:  Vitals:   09/07/18 0930  TempSrc:   PainSc: 0-No pain                 Javonta Gronau,W. EDMOND

## 2018-09-07 NOTE — Anesthesia Procedure Notes (Signed)
Anesthesia Regional Block: Supraclavicular block   Pre-Anesthetic Checklist: ,, timeout performed, Correct Patient, Correct Site, Correct Laterality, Correct Procedure, Correct Position, site marked, Risks and benefits discussed, pre-op evaluation,  At surgeon's request and post-op pain management  Laterality: Left  Prep: Maximum Sterile Barrier Precautions used, Betadine, chloraprep       Needles:  Injection technique: Single-shot  Needle Type: Echogenic Stimulator Needle     Needle Length: 9cm  Needle Gauge: 21     Additional Needles:   Procedures:,,,, ultrasound used (permanent image in chart),,,,  Narrative:  Start time: 09/07/2018 9:14 AM End time: 09/07/2018 9:24 AM Injection made incrementally with aspirations every 5 mL. Anesthesiologist: Roderic Palau, MD  Additional Notes: 2% Lidocaine skin wheel.

## 2018-09-07 NOTE — Discharge Instructions (Signed)
Discharge Instructions ° °- Keep dressings in place. Do not remove them. °- The dressings must stay dry °- Take all medication as prescribed. Transition to over the counter pain medication as your pain improves °- Keep the hand elevated over the next 48-72 hours to help with pain and swelling °- Move all digits not restricted by the dressings regularly to prevent stiffness °- Please call to schedule a follow up appointment with Dr. Tranquilino Fischler and therapy at [phone] for 10-14 days following surgery °- Your pain medication have been send digitally to your pharmacy °

## 2018-09-07 NOTE — Anesthesia Procedure Notes (Signed)
Procedure Name: MAC Date/Time: 09/07/2018 10:44 AM Performed by: Neldon Newport, CRNA Pre-anesthesia Checklist: Timeout performed, Patient being monitored, Suction available, Emergency Drugs available and Patient identified Patient Re-evaluated:Patient Re-evaluated prior to induction Oxygen Delivery Method: Simple face mask

## 2018-09-07 NOTE — H&P (Signed)
ORTHOPAEDIC H&P  PCP:  [name], MD  Chief Complaint: Left thumb pain  HPI: [name] [name] is a 78 y.o. male who complains of left thumb pain.  He was seen and evaluated by me in clinic and found to have severe left thumb CMC osteoarthritis.  After discussing treatment options and failing all conservative measures he presents today for left thumb [facility] arthroplasty  Past Medical History:  Diagnosis Date  . Abnormal EKG    Possible Type II Brugada ECG pattern. No family history of SCD, no syncope, no tachypalpitations.  . Anxiety   . Arthritis    s/p TKR  . Bipolar disorder ([place])    "touches"     . Bladder cancer (HCC)    [facility] low grade papillary urothileal carcinoma  . BPH (benign prostatic hyperplasia)   . CAD (coronary artery disease)    a. LHC 1/15 - mid LCx 50 and 60%, proximal RCA 50%  . Chronic diastolic CHF (congestive heart failure) ([place])   . CKD (chronic kidney disease)   . COPD (chronic obstructive pulmonary disease) ([place])    Prior heavy smoker. PFTs (3/11): FVC 87%, FEV1 73%, ratio 0.57, DLCO 75%, TLC 121%. Moderate obstructive defect. PFTs (9/15): Only minimal obstruction, sugPrior heavy smoker. PFTs (3/11): FVC 87%, FEV1 73%, ratio 0.57, DLCO 75%, TLC 121%. Moderate obstructive defect. PFTs (9/15) minimal obstruction, poss asthma component   . Depression    with bipolar tendencies  . DOE (dyspnea on exertion)    a. Myoview 8/09- EF 57%, no ischemia // b. Myoview (3/11) EF 68%, diaphragmatic attenuation, no ischemia //  c Echo (4/11) EF 0000000, mild diastolic dysfunction, PASP 38 mmHg // d. PFTs 9/15 minimal obstruction  /  e. CT negative for ILD  . GERD (gastroesophageal reflux disease)    not current  . History of Doppler ultrasound    Carotid US 3/11 negative for significant stenosis  //  carotid US 6/17: Mild bilateral plaque, 1-39% ICA  . History of echocardiogram    a. Echo 12/14 Inferior and distal septal HK, mild LVH, EF 50-55%, mild MR,  mild LAE  //  b. Echo 6/17: Mild focal basal septal hypertrophy, EF 60-65%, normal wall motion, grade 1 diastolic dysfunction, mild AI  . HLD (hyperlipidemia)   . Hypertensive heart disease with CHF (congestive heart failure) ([place]) 04/08/2009  . Low testosterone   . Lung nodule    a. CT in 2015 >> PET in 12/15 not sugg of malignancy // CT 6/19: continue to look benign  . LVH (left ventricular hypertrophy)    a. Echo 12/14 Inferior and distal septal HK, mild LVH, EF 50-55%, mild MR, mild LAE  . Mild dementia ([place])   . Mitral regurgitation    Echo (4/11) with PISA ERO 0.3 cm^2 and regurgitant volume 41 mL (moderate MR). Echo (12/14) with mild MR.   . MS (multiple sclerosis) ([place])   . MS (multiple sclerosis) ([place])   . OSA (obstructive sleep apnea)   . Raynaud's disease   . Right bundle branch block   . Stroke [facility])    3 06/2015- 06/2015  memory loss  . Thoracic aortic aneurysm (HCC)    4.1 cm 2017 // Chest CTA 6/19: stable ascending thoracic aortic aneurysm 4.1 cm, aortic atherosclerosis, stable RML nodular densities (appears benign), emphysema >> FU 1 year  . Urinary retention with incomplete bladder emptying    receives botox injections and treatment for BPH through [facility]  Past Surgical History:  Procedure Laterality Date  . APPENDECTOMY    . COLONOSCOPY    . EP IMPLANTABLE DEVICE N/A 06/23/2015   Procedure: Loop Recorder Insertion;  Surgeon: Deboraha Sprang, MD;  Location: [facility] CV LAB;  Service: Cardiovascular;  Laterality: N/A;  . HEMORRHOID SURGERY    . KNEE ARTHROPLASTY Left   . LEFT HEART CATHETERIZATION WITH CORONARY ANGIOGRAM N/A 01/05/2013   Procedure: LEFT HEART CATHETERIZATION WITH CORONARY ANGIOGRAM;  Surgeon: Larey Dresser, MD;  Location: [facility] C[facility] LAB;  Service: Cardiovascular;  Laterality: N/A;  . ROTATOR CUFF REPAIR Right    x 2  . TEE WITHOUT CARDIOVERSION N/A 06/23/2015   Procedure: TRANSESOPHAGEAL ECHOCARDIOGRAM (TEE);  Surgeon: Fay Records, MD;  Location: [facility]  ENDOSCOPY;  Service: Cardiovascular;  Laterality: N/A;  . TONSILLECTOMY    . TOTAL KNEE ARTHROPLASTY Right   . TRANSURETHRAL RESECTION OF PROSTATE     Social History   Socioeconomic History  . Marital status: Married    Spouse name: Not on file  . Number of children: Not on file  . Years of education: Not on file  . Highest education level: Not on file  Occupational History  . Occupation: retired    Fish farm manager: RETIRED  Social Needs  . Financial resource strain: Not on file  . Food insecurity    Worry: Not on file    Inability: Not on file  . Transportation needs    Medical: Not on file    Non-medical: Not on file  Tobacco Use  . Smoking status: Current Every Day Smoker    Years: 40.00    Types: Cigars  . Smokeless tobacco: Former Systems developer    Types: Chew  . Tobacco comment: 3-4 a day ,"quit cigarettes years ago"  Substance and Sexual Activity  . Alcohol use: Yes    Alcohol/week: 14.0 standard drinks    Types: 2 Standard drinks or equivalent, 12 Cans of beer per week  . Drug use: No  . Sexual activity: Not on file  Lifestyle  . Physical activity    Days per week: Not on file    Minutes per session: Not on file  . Stress: Not on file  Relationships  . Social Herbalist on phone: Not on file    Gets together: Not on file    Attends religious service: Not on file    Active member of club or organization: Not on file    Attends meetings of clubs or organizations: Not on file    Relationship status: Not on file  Other Topics Concern  . Not on file  Social History Narrative   Retired from the [employer] after 20 years   [place] Veteran   Family History  Problem Relation Age of Onset  . Stroke Father   . Stroke Mother   . Hypertension Sister   . Kidney cancer Brother   . Arthritis Brother   . Other Brother        knee problems, Bil TKR  . Hypertension Other        family history  . Prostate cancer Neg Hx   . Bladder Cancer Neg Hx    Allergies   Allergen Reactions  . Alcohol-Sulfur [Sulfur] Other (See Comments)    Patient was suspected of having "Brugada Syndrome"  . Bupivacaine Other (See Comments)    Patient was suspected of having "Brugada Syndrome"  . Clomipramine Hcl Other (See Comments)    Patient was suspected  of having "Brugada Syndrome"  . Flecainide Other (See Comments)    Patient was suspected of having "Brugada Syndrome"  . Lithium Other (See Comments)    Patient was suspected of having "Brugada Syndrome"  . Acetylcholine     Patient was suspected of having "Brugada Syndrome": (BrS) is a genetic condition that results in abnormal electrical activity within the heart, increasing the risk of sudden cardiac death. Those affected may have episodes of passing out.  . Amitriptyline Other (See Comments)    Patient was suspected of having "Brugada Syndrome"  . Cocaine Other (See Comments)    Patient was suspected of having "Brugada Syndrome"  . Desipramine Other (See Comments)    Patient was suspected of having "Brugada Syndrome"  . Ergonovine Other (See Comments)    Patient was suspected of having "Brugada Syndrome"  . Loxapine Other (See Comments)    Patient was suspected of having "Brugada Syndrome"  . Nortriptyline Other (See Comments)    Patient was suspected of having "Brugada Syndrome"  . Oxcarbazepine Other (See Comments)    Patient was suspected of having "Brugada Syndrome"  . Procainamide Other (See Comments)    Patient was suspected of having "Brugada Syndrome"  . Procaine Other (See Comments)    Patient was suspected of having "Brugada Syndrome"  . Propafenone Other (See Comments)    Patient was suspected of having "Brugada Syndrome"  . Propofol Other (See Comments)    Patient was suspected of having "Brugada Syndrome"  . Trifluoperazine Other (See Comments)    Patient was suspected of having "Brugada Syndrome"   Prior to Admission medications   Medication Sig Start Date End Date Taking? Authorizing  Provider  acetaminophen (TYLENOL) 500 MG tablet Take 1,000 mg by mouth 2 (two) times daily.   Yes [provider]  atorvastatin (LIPITOR) 40 MG tablet Take 1 tablet (40 mg total) by mouth daily. 08/01/18  Yes [name], MD  B Complex-C (B-COMPLEX WITH VITAMIN C) tablet Take 1 tablet by mouth daily.   Yes [provider]  Cholecalciferol (VITAMIN D) 50 MCG (2000 UT) CAPS Take 2,000 Units by mouth daily.   Yes [provider]  donepezil (ARICEPT) 10 MG tablet Take 1 tablet (10 mg total) by mouth at bedtime. 10/13/17  Yes [name], MD  doxazosin (CARDURA) 4 MG tablet Take 1 tablet (4 mg total) by mouth daily. 08/21/18  Yes Pickard, Cammie Mcgee, MD  ELIQUIS 5 MG TABS tablet TAKE 1 TABLET TWICE A DAY Patient taking differently: Take 5 mg by mouth 2 (two) times daily.  08/18/18  Yes PickardCammie Mcgee, MD  Flaxseed, Linseed, (FLAXSEED OIL MAX STR) 1300 MG CAPS Take 1,300 mg by mouth daily.   Yes [provider]  furosemide (LASIX) 40 MG tablet TAKE 1 TABLET(40 MG) BY MOUTH DAILY Patient taking differently: Take 40 mg by mouth daily.  04/27/18  Yes [name], MD  gabapentin (NEURONTIN) 300 MG capsule Take 1 capsule (300 mg total) by mouth 2 (two) times daily. Patient taking 300mg  twice daily 04/27/18  Yes Pickard, Cammie Mcgee, MD  HYDROcodone-acetaminophen (NORCO) 10-325 MG tablet Take 1 tablet by mouth every 6 (six) hours as needed for moderate pain or severe pain (DX: G89.4, (G35 - MS)). Patient taking differently: Take 2 tablets by mouth every morning.  08/22/18  Yes [name], MD  losartan (COZAAR) 100 MG tablet Take 1 tablet (100 mg total) by mouth daily. 04/27/18 07/21/19 Yes [name], MD  magnesium gluconate (MAGONATE) 500 MG tablet Take 500 mg by mouth daily.   Yes [provider]  metoprolol succinate (TOPROL-XL) 100 MG 24 hr tablet Take 1 1/2 tablets (150 mg total) by mouth daily. 04/27/18  Yes [name], MD  Multiple  Vitamin (MULTIVITAMIN WITH MINERALS) TABS tablet Take 1 tablet by mouth daily. 11/18/17  Yes Rizwan, Eunice Blase, MD  PROAIR HFA 108 (90 Base) MCG/ACT inhaler USE 1 TO 2 INHALATIONS EVERY 6 HOURS AS NEEDED FOR WHEEZING OR SHORTNESS OF BREATH Patient taking differently: 1-2 puffs every 6 (six) hours as needed for wheezing or shortness of breath.  10/17/15  Yes [name], MD  rOPINIRole (REQUIP) 1 MG tablet Take 1 tablet (1 mg total) by mouth 2 (two) times a day. 05/08/18  Yes [name], MD  senna-docusate (SENOKOT-S) 8.6-50 MG tablet Take 1 tablet by mouth at bedtime as needed for mild constipation. Patient taking differently: Take 1 tablet by mouth every other day.  11/18/17  Yes Debbe Odea, MD  thiamine (VITAMIN B-1) 100 MG tablet Take 100 mg by mouth daily.   Yes [provider]  Tiotropium Bromide-Olodaterol (STIOLTO RESPIMAT) 2.5-2.5 MCG/ACT AERS Inhale 2 puffs into the lungs daily. 05/08/18  Yes [name], MD  Venlafaxine HCl 225 MG TB24 Take 1 tablet (225 mg total) by mouth every morning. 04/27/18  Yes [name], MD  acetaminophen (TYLENOL) 325 MG tablet Take 2 tablets (650 mg total) by mouth every 4 (four) hours as needed for mild pain (or temp > 37.5 C (99.5 F)). Patient not taking: Reported on 08/31/2018 11/18/17   Debbe Odea, MD  sulfamethoxazole-trimethoprim (BACTRIM DS) 800-160 MG tablet Take 1 tablet by mouth 2 (two) times daily. Patient not taking: Reported on 08/31/2018 08/16/18   [name], MD   No results found.  Positive ROS: All other systems have been reviewed and were otherwise negative with the exception of those mentioned in the HPI and as above.  Physical Exam: General: Alert, no acute distress Cardiovascular: No pedal edema Respiratory: No cyanosis, no use of accessory musculature Skin: No lesions in the area of chief complaint Psychiatric: Patient is competent for consent with normal mood and affect Lymphatic: No axillary or  cervical lymphadenopathy  MUSCULOSKELETAL: Inspection Left: no erythema, swelling, or warmth and normal wrist appearance. Left wrist palpation no tenderness DRUJ, at the scapholunate interval, or along the first dorsal compartment; no tendernes radiocarpal joint; and tenderness first cmc joint. Special Tests on the Left: CMC grind positive; No Th MP hyperextension. Range of Motion Left: decreased wrist motion. Neurologic Testing Left Tinel's Test negative.  Assessment: Severe left thumb CMC osteoarthritis  Plan: OR today for left thumb CMC arthroplasty Risks, benefits and alternatives once again discussed with him today.  Consent obtained.  Left hand was marked. Plan for discharge home postoperatively.    Verner Mould, MD [phone]   09/07/2018 10:24 AM

## 2018-09-07 NOTE — Transfer of Care (Signed)
Immediate Anesthesia Transfer of Care Note  Patient: [name] [name]  Procedure(s) Performed: Left thumb basilar joint arthroplasty and surgery as indicated (Left )  Patient Location: PACU  Anesthesia Type:MAC  Level of Consciousness: awake, alert  and oriented  Airway & Oxygen Therapy: Patient Spontanous Breathing and Patient connected to nasal cannula oxygen  Post-op Assessment: Report given to RN, Post -op Vital signs reviewed and stable and Patient moving all extremities X 4  Post vital signs: Reviewed and stable  Last Vitals:  Vitals Value Taken Time  BP    Temp    Pulse    Resp    SpO2      Last Pain:  Vitals:   09/07/18 0930  TempSrc:   PainSc: 0-No pain      Patients Stated Pain Goal: 3 (21/19/41 7408)  Complications: No apparent anesthesia complications

## 2018-09-07 NOTE — Op Note (Signed)
PREOPERATIVE DIAGNOSIS: Left thumb severe CMC osteoarthritis  POSTOPERATIVE DIAGNOSIS: Same  ATTENDING PHYSICIAN: Maudry Mayhew. Jeannie Fend, III, MD who was present and scrubbed for the entire case   ASSISTANT SURGEON: None.   ANESTHESIA: Regional with MAC  SURGICAL PROCEDURES: Left thumb basal joint arthroplasty with Endobutton tight rope  SURGICAL INDICATIONS: Patient is a 78 year old male who was seen and evaluated by me in clinic patient had a long history of left thumb pain at the base of his thumb.  Radiographs obtained in clinic which showed severe thumb CMC osteoarthritis.  He had suffered from this for many years and felt that he had failed all conservative measures.  This included anti-inflammatories, narcotics and brace nebulization.  After discussing treatment options he did wish to proceed with left thumb CMC arthroplasty and presents].  FINDING: There is a severely paretic and degenerative trapezium which had severe degenerative changes at the [facility] joint.  Successful trapeziectomy was performed and stabilized using an Arthrex tight rope CMC implant  DESCRIPTION OF PROCEDURE: Patient was identified in the preoperative holding area where the risk benefits and alternatives of the procedure were once again discussed with the patient.  These include but are not limited to infection, bleeding, damage to surrounding structures including blood vessels and nerves, pain, stiffness, subsidence, continued pain and need for additional procedures.  Informed consent was obtained at that time patient's left hand was marked with surgical marking pen.  He then underwent a left upper extremity plexus block by anesthesia.  He was then brought to the operative suite where timeout was performed identifying the correct patient operative site positioned supine on the operative table with his hand outstretched hand table.  He was induced under MAC sedation and the tourniquet was placed on the upper arm.  The arm was  then prepped and draped in usual sterile fashion.  The limb was exsanguinated and tourniquet was inflated.  Fingertrap was placed on the thumb and weights were applied.  A longitudinal incision was then made over the thumb CMC joint.  Blunt dissection was carried down through the subcutaneous tissues.  Crossing superficial nerves were identified and protected.  The plane between the EPB and APL was incised and the tendons were retracted both radially and ulnarly.  The dorsal capsule over the thumb CMC joint was then incised in line with the surgical incision.  The capsule around the trapezium was carefully elevated off sharply using a 15 blade.  The proximal radial artery was identified and protected using retractors.  The auger from the Arthrex kit was placed within the trapezium to aid in removal of the trapezium.  Through manipulation no longer.  The trapezium correct and the decision was then made to remove the bone in a piecemeal fashion.  Rondure was used to identify and remove all small pieces using them until it was smooth the underlying FCR tendon was visualized and found to be intact.  STT joint was also visualized through retraction of the index finger and showed smooth arthritic changes.  At this point the Arthrex [facility] tight rope guidepin was utilized.  This was inserted into the base of the first metacarpal and advanced obliquely into the shaft of the second metacarpal.  A second small surgical incision was made at this area and the pin was passed through the wound.  Fluoroscopic images were obtained which ensured appropriate positioning and trajectory.  A bicortical purchase was placed on the second metacarpal.  The sutures were then passed into the small wound of  the second metacarpal using the at the end of the guidepin.  The Endobutton was secured proximally on the first metacarpal.  The thumb was then positioned in alignment abduction and pronation.  The second Endobutton was placed on the  distal sutures and lightly tied to be sure appropriate tension.  Fluoroscopic images were obtained which showed appropriate positioning and no subsidence of the thumb metacarpal.  At this point both wounds were copiously irrigated with normal saline.  The deep fascia over the second metacarpal was closed with a single 3-0 Vicryl suture.  The dorsal thumb CMC capsule was also closed with 3-0 Vicryl sutures.  The skin was closed with interrupted 4-0 Prolene sutures.  Xeroform, 4 x 4's and a well-padded thumb spica splint were then applied.  The tourniquet was released and patient had return of brisk capillary refill to all procedures.  He was awoken from his sedation and taken to PACU in stable condition.  RADIOGRAPHIC INTERPRETATION: 1 view of the left thumb including a hyperpronated image was obtained intraoperatively through fluoroscopy.  This showed trapezia ectomy with Endobutton fixation of the first metacarpal to the second.  ESTIMATED BLOOD LOSS: Less than 10 mL's  TOURNIQUET TIME: 55 minutes  SPECIMENS: None  POSTOPERATIVE PLAN: The patient will be discharged home and seen back  in the office in approximately 10-12 days for wound check, suture  removal, and then be sent to a therapist for a thumb spica splint and early range of motion of the thumb  IMPLANTS: Arthrex CMC tight rope Endobutton

## 2018-09-12 ENCOUNTER — Encounter (HOSPITAL_COMMUNITY): Payer: Self-pay | Admitting: Orthopaedic Surgery

## 2018-09-12 DIAGNOSIS — M5030 Other cervical disc degeneration, unspecified cervical region: Secondary | ICD-10-CM | POA: Diagnosis not present

## 2018-09-12 DIAGNOSIS — M542 Cervicalgia: Secondary | ICD-10-CM | POA: Diagnosis not present

## 2018-09-19 ENCOUNTER — Other Ambulatory Visit: Payer: Self-pay

## 2018-09-19 MED ORDER — HYDROCODONE-ACETAMINOPHEN 10-325 MG PO TABS: 1.0000 | ORAL_TABLET | Freq: Four times a day (QID) | ORAL | 0 refills | Status: DC | PRN

## 2018-09-19 NOTE — Telephone Encounter (Signed)
Requested Prescriptions   Pending Prescriptions Disp Refills  . HYDROcodone-acetaminophen (NORCO) 10-325 MG tablet 35 tablet 0    Sig: Take 1-2 tablets by mouth every 6 (six) hours as needed for moderate pain or severe pain (DX: G89.4, (G35 - MS)).    Last OV 08/16/2018  Last written 09/07/2018

## 2018-09-22 DIAGNOSIS — Z5189 Encounter for other specified aftercare: Secondary | ICD-10-CM | POA: Diagnosis not present

## 2018-09-22 DIAGNOSIS — M13842 Other specified arthritis, left hand: Secondary | ICD-10-CM | POA: Diagnosis not present

## 2018-09-29 ENCOUNTER — Other Ambulatory Visit: Payer: Self-pay | Admitting: Family Medicine

## 2018-09-29 DIAGNOSIS — M79645 Pain in left finger(s): Secondary | ICD-10-CM | POA: Diagnosis not present

## 2018-09-29 MED ORDER — DONEPEZIL HCL 10 MG PO TABS: 10.0000 mg | ORAL_TABLET | Freq: Every day | ORAL | 3 refills | Status: DC

## 2018-10-03 DIAGNOSIS — M79645 Pain in left finger(s): Secondary | ICD-10-CM | POA: Diagnosis not present

## 2018-10-07 ENCOUNTER — Other Ambulatory Visit: Payer: Self-pay | Admitting: Family Medicine

## 2018-10-09 ENCOUNTER — Other Ambulatory Visit: Payer: Self-pay

## 2018-10-09 ENCOUNTER — Ambulatory Visit (INDEPENDENT_AMBULATORY_CARE_PROVIDER_SITE_OTHER)
Admission: RE | Admit: 2018-10-09 | Discharge: 2018-10-09 | Disposition: A | Discharge status: Home / Self Care | Payer: Medicare Other | Source: Ambulatory Visit | Attending: Physician Assistant | Admitting: Physician Assistant

## 2018-10-09 DIAGNOSIS — I712 Thoracic aortic aneurysm, without rupture, unspecified: Secondary | ICD-10-CM

## 2018-10-09 MED ORDER — IOHEXOL 350 MG/ML SOLN
100.0000 mL | Freq: Once | INTRAVENOUS | Status: AC | PRN
  Administered 2018-10-09: 100 mL via INTRAVENOUS

## 2018-10-10 ENCOUNTER — Telehealth: Payer: Self-pay | Admitting: *Deleted

## 2018-10-10 ENCOUNTER — Other Ambulatory Visit: Payer: Self-pay | Admitting: *Deleted

## 2018-10-10 DIAGNOSIS — R911 Solitary pulmonary nodule: Secondary | ICD-10-CM

## 2018-10-10 DIAGNOSIS — I712 Thoracic aortic aneurysm, without rupture, unspecified: Secondary | ICD-10-CM

## 2018-10-10 NOTE — Telephone Encounter (Signed)
-----   Message from Liliane Shi, [place] sent at 10/09/2018  5:08 PM EDT ----- The Chest CT Angiogram demonstrates stable thoracic aortic aneurysm measuring 4.1 cm.  This is unchanged.  There is a right lower lung density that was present on prior scans but appears to have gotten bigger.  He will need a repeat scan in 3 months, but I prefer he follow up with Pulmonology to have this evaluated.   PLAN:   -Repeat Chest CT Angiogram in 1 year (Dx - Thoracic Aortic Aneurysm)  -Refer to Pulmonology (Dx - Lung Nodule).  He has seen Dr. Melvyn Novas in the past.  -Send copy to PCP. Richardson Dopp, PA-C    10/09/2018 4:55 PM

## 2018-10-12 DIAGNOSIS — M79645 Pain in left finger(s): Secondary | ICD-10-CM | POA: Diagnosis not present

## 2018-10-19 ENCOUNTER — Ambulatory Visit (INDEPENDENT_AMBULATORY_CARE_PROVIDER_SITE_OTHER): Payer: Medicare Other | Admitting: Internal Medicine

## 2018-10-19 ENCOUNTER — Encounter: Payer: Self-pay | Admitting: Internal Medicine

## 2018-10-19 ENCOUNTER — Other Ambulatory Visit: Payer: Self-pay

## 2018-10-19 DIAGNOSIS — R911 Solitary pulmonary nodule: Secondary | ICD-10-CM | POA: Diagnosis not present

## 2018-10-19 DIAGNOSIS — J449 Chronic obstructive pulmonary disease, unspecified: Secondary | ICD-10-CM

## 2018-10-19 MED ORDER — STIOLTO RESPIMAT 2.5-2.5 MCG/ACT IN AERS: 2.0000 | INHALATION_SPRAY | Freq: Every day | RESPIRATORY_TRACT | 0 refills | Status: DC

## 2018-10-19 NOTE — Progress Notes (Signed)
Subjective:    Patient ID: [name] [name], male    DOB: 1940-11-07    MRN: [id number]  HPI  46  yowm active smoker (cigars since feb 2020 ) GOLD III criteria with baseline doe =  Uses [facility] and does [employer] walking slow pace s stopping and referred to pulmonary clinic 10/19/2018 by Richardson Dopp PA for lung nodule    10/19/2018  Pulmonary Consultation re SPN/ severe copd  Chief Complaint  Patient presents with  . Consult   Dyspnea:  MMRC2 = can't walk a nl pace on a flat grade s sob but does fine slow and flat  Cough: a little rattle esp in am/ min mucoid  Sleeping: fine on cpap per [facility] S[facility] use: when he/wife remember it = stiolto 2 puffs each am / never uses saba  02: none    No obvious day to day or daytime variability or assoc excess/ purulent sputum or mucus plugs or hemoptysis or cp or chest tightness, subjective wheeze or overt sinus or hb symptoms.   Sleeping on cpap  without nocturnal  or early am exacerbation  of respiratory  c/o's or need for noct saba. Also denies any obvious fluctuation of symptoms with weather or environmental changes or other aggravating or alleviating factors except as outlined above   No unusual exposure hx or h/o childhood pna/ asthma or knowledge of premature birth.  Current Allergies, Complete Past Medical History, Past Surgical History, Family History, and Social History were reviewed in [employer] record.          Current Meds  Medication Sig  . acetaminophen (TYLENOL) 500 MG tablet Take 1,000 mg by mouth 2 (two) times daily.  Marland Kitchen atorvastatin (LIPITOR) 40 MG tablet TAKE 1 TABLET DAILY  . B Complex-C (B-COMPLEX WITH VITAMIN C) tablet Take 1 tablet by mouth daily.  . Cholecalciferol (VITAMIN D) 50 MCG (2000 UT) CAPS Take 2,000 Units by mouth daily.  Marland Kitchen donepezil (ARICEPT) 10 MG tablet Take 1 tablet (10 mg total) by mouth at bedtime.  Marland Kitchen doxazosin (CARDURA) 4 MG tablet Take 1 tablet (4 mg total) by mouth daily.   Marland Kitchen ELIQUIS 5 MG TABS tablet TAKE 1 TABLET TWICE A DAY (Patient taking differently: Take 5 mg by mouth 2 (two) times daily. )  . Flaxseed, Linseed, (FLAXSEED OIL MAX STR) 1300 MG CAPS Take 1,300 mg by mouth daily.  . furosemide (LASIX) 40 MG tablet TAKE 1 TABLET(40 MG) BY MOUTH DAILY (Patient taking differently: Take 40 mg by mouth daily. )  . gabapentin (NEURONTIN) 300 MG capsule Take 1 capsule (300 mg total) by mouth 2 (two) times daily. Patient taking 300mg  twice daily  . HYDROcodone-acetaminophen (NORCO) 10-325 MG tablet Take 1-2 tablets by mouth every 6 (six) hours as needed for moderate pain or severe pain (DX: G89.4, (G35 - MS)).  Marland Kitchen losartan (COZAAR) 100 MG tablet Take 1 tablet (100 mg total) by mouth daily.  . magnesium gluconate (MAGONATE) 500 MG tablet Take 500 mg by mouth daily.  . metoprolol succinate (TOPROL-XL) 100 MG 24 hr tablet Take 1 1/2 tablets (150 mg total) by mouth daily.  Marland Kitchen PROAIR HFA 108 (90 Base) MCG/ACT inhaler USE 1 TO 2 INHALATIONS EVERY 6 HOURS AS NEEDED FOR WHEEZING OR SHORTNESS OF BREATH (Patient taking differently: 1-2 puffs every 6 (six) hours as needed for wheezing or shortness of breath. )  . rOPINIRole (REQUIP) 1 MG tablet Take 1 tablet (1 mg total) by mouth 2 (  two) times a day.  . senna-docusate (SENOKOT-S) 8.6-50 MG tablet Take 1 tablet by mouth at bedtime as needed for mild constipation. (Patient taking differently: Take 1 tablet by mouth every other day. )  . thiamine (VITAMIN B-1) 100 MG tablet Take 100 mg by mouth daily.  . Tiotropium Bromide-Olodaterol (STIOLTO RESPIMAT) 2.5-2.5 MCG/ACT AERS Inhale 2 puffs into the lungs daily.  . Venlafaxine HCl 225 MG TB24 Take 1 tablet (225 mg total) by mouth every morning.        Review of Systems  Constitutional: Positive for unexpected weight change. Negative for fever.  HENT: Negative for congestion, dental problem, ear pain, nosebleeds, postnasal drip, rhinorrhea, sinus pressure, sneezing, sore throat and trouble  swallowing.   Eyes: Negative for redness and itching.  Respiratory: Positive for cough and shortness of breath. Negative for chest tightness and wheezing.   Cardiovascular: Positive for leg swelling. Negative for palpitations.  Gastrointestinal: Negative for nausea and vomiting.  Genitourinary: Negative for dysuria.  Musculoskeletal: Negative for joint swelling.  Skin: Negative for rash.  Neurological: Negative for headaches. Positive for st memory loss Hematological: Does not bruise/bleed easily.  Psychiatric/Behavioral: Negative for dysphoric mood. The patient is nervous/anxious.         Objective:   Physical Exam  amb wm hard of hearing and processing questions   Wt Readings from Last 3 Encounters:  10/19/18 218 lb (98.9 kg)  09/07/18 218 lb (98.9 kg)  09/04/18 218 lb (98.9 kg)     Vital signs reviewed - Note on arrival 02 sats  95% on RA   HEENT : pt wearing mask not removed for exam due to covid -19 concerns.    NECK :  without JVD/Nodes/TM/ nl carotid upstrokes bilaterally   LUNGS: no acc muscle use,  Mod barrel  contour chest wall with bilateral  Distant bs s audible wheeze and  without cough on insp or exp maneuvers and mod  Hyperresonant  to  percussion bilaterally     CV:  RRR  no s3 or murmur or increase in P2, and no edema   ABD:  soft and nontender with pos mid insp Hoover's  in the supine position. No bruits or organomegaly appreciated, bowel sounds nl  MS:     ext warm without deformities, calf tenderness, cyanosis or clubbing L hand in brace  SKIN: warm and dry without lesions    NEURO:  Alert, knows current events and day of week but not what month it is. No motor deficies         I personally reviewed images and agree with radiology impression as follows:  Chest CTa  10/09/2018  2.2 x 1.6 cm irregular density is noted in right lower lobe which appears to be enlarged compared to prior exam My review:  Lesion abbutts the Major fissure.           Assessment & Plan:

## 2018-10-19 NOTE — Assessment & Plan Note (Addendum)
Active cigar smoker PFT 09/04/2013  FEV1 72%, ratio 68, FVC 77%, no sign BD response, decreased diffusing capacity at 66%.  Decreased Mid flows 53% w/ +BD response.  - PFT's  06/18/2016  FEV1 1.96 (64 % ) ratio 71   p 6 % improvement from saba p ? prior to study with DLCO  63/69 % corrects to 88 % for alv volume   - 07/06/2016  Walked RA x 4 laps @ 185 ft each stopped due to  End of study, nl pace, no desats or sob  - 02/15/2017  Walked RA x 3 laps @ 185 ft each stopped due to endo of study, no desat, min sob    - PFT's  06/02/2017  FEV1 1.43 (48 % ) ratio 66  p spiriva  prior to study with DLCO  56 % corrects to 72  % for alv volume  And erv 37%  - not feeling up to post saba study - 06/02/2017   change to stiolto 2 each am  - 10/19/2018  After extensive coaching inhaler device,  effectiveness =    90% with smi > continue stiolto   Pt is Group B in terms of symptom/risk and laba/lama therefore appropriate rx at this point >>>  Continue stiolto 2 puffs each am (using high octane fuel analogy to help pt understand how it improves performance s affecting prognosis or fixing the underlying problem).   Total time devoted to counseling  > 50 % of initial 60 min office consultation:   reviewed case with pt/wife/  directly observed portions of ambulatory 02 saturation study/ I performed device teaching  using a teach back technique which also  extended face to face time for this visit (see above)  discussion of options/alternatives/ personally creating written customized instructions  in presence of pt  then going over those specific  Instructions directly with the pt including how to use all of the meds but in particular covering each new medication in detail and the difference between the maintenance= "automatic" meds and the prns using an action plan format for the latter (If this problem/symptom => do that organization reading Left to right).  Please see AVS from this visit for a full list of these instructions  which I personally wrote for this pt and  are unique to this visit.

## 2018-10-19 NOTE — Assessment & Plan Note (Signed)
1st detected on CT chest done 06/2013 that was neg for ILD . RML ndoularity and 6 mm nodule noted.   Mild emphysema and bronchial wall thickening.  CT Chest ~09/27/2013 >>no changes  PET scan 12/11/13  >>(heavy smoker, nodularity appearance on last scan and bladder cancer ) >No significant metabolic activity associated with the nodular opacities within the inferior aspect the right middle lobe. This favors benign post infectious or inflammatory process.. No evidence metastasis within the abdomen or pelvis. CT chest 08/07/2014 >Stable bilateral pulmonary lesions >repeat 06/2015 no change   Chest CTa  10/09/2018  2.2 x 1.6 cm irregular density is noted in right lower lobe which appears to be enlarged compared to prior exam My review:  Lesion abbutts the Major fissure. - PET 10/19/2018 >>>   The longstanding nature of these nodules with prev pet neg is suggestive of a benign process or carcinoid and the location is not amenable to resection but if Pos on PET now would rec tissue dx with an eye towards Stereotactic RT perhaps  Discussed in detail all the  indications, usual  risks and alternatives  relative to the benefits with patient and wife  who agree  to proceed with w/u as outlined.

## 2018-10-19 NOTE — Progress Notes (Signed)
Subjective:    Patient ID: [name] [name], male    DOB: 24-Jan-1940    MRN: [id number]  HPI  34  yowm active smoker (cigars since feb 2020 ) GOLD III criteria with baseline doe =  Uses [employer] [facility] and does [employer] walking slow pace s stopping and referred to pulmonary clinic 10/19/2018 by Richardson Dopp PA for lung nodule    10/19/2018  Pulmonary consultation re SPN/ severe copd  Chief Complaint  Patient presents with  . Consult   Dyspnea:  MMRC2 = can't walk a nl pace on a flat grade s sob but does fine slow and flat  Cough: a little rattle esp in am/ min mucoid  Sleeping: fine on [facility]ap [facility] S[facility] use: when he/wife remember it = stiolto 2 puffs each am / never uses saba  02: none    No obvious day to day or daytime variability or assoc excess/ purulent sputum or mucus plugs or hemoptysis or cp or chest tightness, subjective wheeze or overt sinus or hb symptoms.   Sleeping on cpap  without nocturnal  or early am exacerbation  of respiratory  c/o's or need for noct saba. Also denies any obvious fluctuation of symptoms with weather or environmental changes or other aggravating or alleviating factors except as outlined above   No unusual exposure hx or h/o childhood pna/ asthma or knowledge of premature birth.  Current Allergies, Complete Past Medical History, Past Surgical History, Family History, and Social History were reviewed in [employer] record.          Current Meds  Medication Sig  . acetaminophen (TYLENOL) 500 MG tablet Take 1,000 mg by mouth 2 (two) times daily.  Marland Kitchen atorvastatin (LIPITOR) 40 MG tablet TAKE 1 TABLET DAILY  . B Complex-C (B-COMPLEX WITH VITAMIN C) tablet Take 1 tablet by mouth daily.  . Cholecalciferol (VITAMIN D) 50 MCG (2000 UT) CAPS Take 2,000 Units by mouth daily.  Marland Kitchen donepezil (ARICEPT) 10 MG tablet Take 1 tablet (10 mg total) by mouth at bedtime.  Marland Kitchen doxazosin (CARDURA) 4 MG tablet Take 1 tablet (4 mg total) by mouth daily.   Marland Kitchen ELIQUIS 5 MG TABS tablet TAKE 1 TABLET TWICE A DAY (Patient taking differently: Take 5 mg by mouth 2 (two) times daily. )  . Flaxseed, Linseed, (FLAXSEED OIL MAX STR) 1300 MG CAPS Take 1,300 mg by mouth daily.  . furosemide (LASIX) 40 MG tablet TAKE 1 TABLET(40 MG) BY MOUTH DAILY (Patient taking differently: Take 40 mg by mouth daily. )  . gabapentin (NEURONTIN) 300 MG capsule Take 1 capsule (300 mg total) by mouth 2 (two) times daily. Patient taking 300mg  twice daily  . HYDROcodone-acetaminophen (NORCO) 10-325 MG tablet Take 1-2 tablets by mouth every 6 (six) hours as needed for moderate pain or severe pain (DX: G89.4, (G35 - MS)).  Marland Kitchen losartan (COZAAR) 100 MG tablet Take 1 tablet (100 mg total) by mouth daily.  . magnesium gluconate (MAGONATE) 500 MG tablet Take 500 mg by mouth daily.  . metoprolol succinate (TOPROL-XL) 100 MG 24 hr tablet Take 1 1/2 tablets (150 mg total) by mouth daily.  Marland Kitchen PROAIR HFA 108 (90 Base) MCG/ACT inhaler USE 1 TO 2 INHALATIONS EVERY 6 HOURS AS NEEDED FOR WHEEZING OR SHORTNESS OF BREATH (Patient taking differently: 1-2 puffs every 6 (six) hours as needed for wheezing or shortness of breath. )  . rOPINIRole (REQUIP) 1 MG tablet Take 1 tablet (1 mg total) by mouth 2 (  two) times a day.  . senna-docusate (SENOKOT-S) 8.6-50 MG tablet Take 1 tablet by mouth at bedtime as needed for mild constipation. (Patient taking differently: Take 1 tablet by mouth every other day. )  . thiamine (VITAMIN B-1) 100 MG tablet Take 100 mg by mouth daily.  . Tiotropium Bromide-Olodaterol (STIOLTO RESPIMAT) 2.5-2.5 MCG/ACT AERS Inhale 2 puffs into the lungs daily.  . Venlafaxine HCl 225 MG TB24 Take 1 tablet (225 mg total) by mouth every morning.        Review of Systems  Constitutional: Positive for unexpected weight change. Negative for fever.  HENT: Negative for congestion, dental problem, ear pain, nosebleeds, postnasal drip, rhinorrhea, sinus pressure, sneezing, sore throat and trouble  swallowing.   Eyes: Negative for redness and itching.  Respiratory: Positive for cough and shortness of breath. Negative for chest tightness and wheezing.   Cardiovascular: Positive for leg swelling. Negative for palpitations.  Gastrointestinal: Negative for nausea and vomiting.  Genitourinary: Negative for dysuria.  Musculoskeletal: Negative for joint swelling.  Skin: Negative for rash.  Neurological: Negative for headaches.  Hematological: Does not bruise/bleed easily.  Psychiatric/Behavioral: Negative for dysphoric mood. The patient is nervous/anxious.        Objective:   Physical Exam  amb wm hard of hearing and processing questions   Wt Readings from Last 3 Encounters:  10/19/18 218 lb (98.9 kg)  09/07/18 218 lb (98.9 kg)  09/04/18 218 lb (98.9 kg)     Vital signs reviewed - Note on arrival 02 sats  95% on RA   HEENT : pt wearing mask not removed for exam due to covid -19 concerns.    NECK :  without JVD/Nodes/TM/ nl carotid upstrokes bilaterally   LUNGS: no acc muscle use,  Mod barrel  contour chest wall with bilateral  Distant bs s audible wheeze and  without cough on insp or exp maneuvers and mod  Hyperresonant  to  percussion bilaterally     CV:  RRR  no s3 or murmur or increase in P2, and no edema   ABD:  soft and nontender with pos mid insp Hoover's  in the supine position. No bruits or organomegaly appreciated, bowel sounds nl  MS:     ext warm without deformities, calf tenderness, cyanosis or clubbing No obvious joint restrictions   SKIN: warm and dry without lesions    NEURO:  Alert, knows current events and day of week but not what month it is. No motor deficies         I personally reviewed images and agree with radiology impression as follows:  Chest CTa  10/09/2018  2.2 x 1.6 cm irregular density is noted in right lower lobe which appears to be enlarged compared to prior exam My review:  Lesion abbutts the Major fissure.          Assessment  & Plan:

## 2018-10-19 NOTE — Patient Instructions (Addendum)
Plan A = Automatic = Always=  Stiolto 2 pffs each am (think of this like high octane fuel)    Work on inhaler technique:  relax and gently blow all the way out then take a nice smooth deep breath back in, triggering the inhaler at same time you start breathing in.  Hold for up to 5 seconds if you can.  Rinse and gargle with water when done   Plan B = Backup (to supplement plan A, not to replace it) Only use your albuterol inhaler as a rescue medication to be used if you can't catch your breath by resting or doing a relaxed purse lip breathing pattern.  - The less you use it, the better it will work when you need it. - Ok to use the inhaler up to 2 puffs  every 4 hours if you must but call for appointment if use goes up over your usual need - Don't leave home without it !!  (think of it like the spare tire for your car)    We will schedule you for a PET scan and decide then what to do about the nodule.

## 2018-10-20 DIAGNOSIS — Z5189 Encounter for other specified aftercare: Secondary | ICD-10-CM | POA: Diagnosis not present

## 2018-10-20 DIAGNOSIS — M13842 Other specified arthritis, left hand: Secondary | ICD-10-CM | POA: Diagnosis not present

## 2018-10-20 DIAGNOSIS — M79645 Pain in left finger(s): Secondary | ICD-10-CM | POA: Diagnosis not present

## 2018-10-25 DIAGNOSIS — M5030 Other cervical disc degeneration, unspecified cervical region: Secondary | ICD-10-CM | POA: Diagnosis not present

## 2018-10-25 DIAGNOSIS — M542 Cervicalgia: Secondary | ICD-10-CM | POA: Diagnosis not present

## 2018-10-25 DIAGNOSIS — M545 Low back pain: Secondary | ICD-10-CM | POA: Diagnosis not present

## 2018-10-26 ENCOUNTER — Other Ambulatory Visit: Payer: Self-pay

## 2018-10-26 ENCOUNTER — Encounter (HOSPITAL_COMMUNITY)
Admission: RE | Admit: 2018-10-26 | Discharge: 2018-10-26 | Disposition: A | Discharge status: Home / Self Care | Payer: Medicare Other | Source: Ambulatory Visit | Attending: Internal Medicine | Admitting: Internal Medicine

## 2018-10-26 DIAGNOSIS — Z79899 Other long term (current) drug therapy: Secondary | ICD-10-CM | POA: Diagnosis not present

## 2018-10-26 DIAGNOSIS — R911 Solitary pulmonary nodule: Secondary | ICD-10-CM | POA: Insufficient documentation

## 2018-10-26 DIAGNOSIS — I700 Atherosclerosis of aorta: Secondary | ICD-10-CM | POA: Diagnosis not present

## 2018-10-26 DIAGNOSIS — M79645 Pain in left finger(s): Secondary | ICD-10-CM | POA: Diagnosis not present

## 2018-10-26 LAB — GLUCOSE, CAPILLARY: Glucose-Capillary: 80 mg/dL (ref 70–99)

## 2018-10-26 MED ORDER — FLUDEOXYGLUCOSE F - 18 (FDG) INJECTION
11.2400 | Freq: Once | INTRAVENOUS | Status: AC | PRN
  Administered 2018-10-26: 11.24 via INTRAVENOUS

## 2018-10-27 NOTE — Progress Notes (Signed)
Spoke with pt's spouse [name] and notified of results per Dr. Melvyn Novas. She verbalized understanding and denied any questions. Copy to PCP Dr Dennard Schaumann.

## 2018-11-02 DIAGNOSIS — M79645 Pain in left finger(s): Secondary | ICD-10-CM | POA: Diagnosis not present

## 2018-11-08 ENCOUNTER — Encounter: Payer: Self-pay | Admitting: Family Medicine

## 2018-11-09 ENCOUNTER — Other Ambulatory Visit: Payer: Self-pay | Admitting: Family Medicine

## 2018-11-09 DIAGNOSIS — M79645 Pain in left finger(s): Secondary | ICD-10-CM | POA: Diagnosis not present

## 2018-11-09 MED ORDER — HYDROCODONE-ACETAMINOPHEN 10-325 MG PO TABS: 1.0000 | ORAL_TABLET | Freq: Four times a day (QID) | ORAL | 0 refills | Status: DC | PRN

## 2018-11-14 NOTE — Addendum Note (Signed)
Addended by: Gaetano Net on: 11/14/2018 05:00 PM   Modules accepted: Orders

## 2018-11-15 DIAGNOSIS — M542 Cervicalgia: Secondary | ICD-10-CM | POA: Diagnosis not present

## 2018-11-16 ENCOUNTER — Other Ambulatory Visit: Payer: Self-pay

## 2018-11-16 MED ORDER — VENLAFAXINE HCL ER 225 MG PO TB24: 225.0000 mg | ORAL_TABLET | Freq: Every morning | ORAL | 1 refills | Status: DC

## 2018-11-20 DIAGNOSIS — M79645 Pain in left finger(s): Secondary | ICD-10-CM | POA: Diagnosis not present

## 2018-12-08 ENCOUNTER — Other Ambulatory Visit: Payer: Self-pay | Admitting: Family Medicine

## 2018-12-08 MED ORDER — HYDROCODONE-ACETAMINOPHEN 10-325 MG PO TABS: 1.0000 | ORAL_TABLET | Freq: Four times a day (QID) | ORAL | 0 refills | Status: DC | PRN

## 2018-12-08 NOTE — Telephone Encounter (Signed)
Patient is calling to get refill on hydrocodone  [facility] [address]  [phone]

## 2018-12-08 NOTE — Telephone Encounter (Signed)
Patient is requesting a refill on Hydrocodone   LOV: 08/16/18  LRF:   11/09/2018

## 2018-12-11 ENCOUNTER — Encounter: Payer: Self-pay | Admitting: Family Medicine

## 2018-12-18 ENCOUNTER — Other Ambulatory Visit: Payer: Self-pay

## 2018-12-18 ENCOUNTER — Ambulatory Visit (INDEPENDENT_AMBULATORY_CARE_PROVIDER_SITE_OTHER): Payer: Medicare Other | Admitting: Family Medicine

## 2018-12-18 ENCOUNTER — Encounter: Payer: Self-pay | Admitting: Family Medicine

## 2018-12-18 VITALS — BP 142/60 | HR 84 | Temp 97.6°F | Resp 20 | Ht 71.0 in | Wt 232.0 lb

## 2018-12-18 DIAGNOSIS — R4586 Emotional lability: Secondary | ICD-10-CM

## 2018-12-18 DIAGNOSIS — R413 Other amnesia: Secondary | ICD-10-CM | POA: Diagnosis not present

## 2018-12-18 DIAGNOSIS — M545 Low back pain, unspecified: Secondary | ICD-10-CM

## 2018-12-18 DIAGNOSIS — R4189 Other symptoms and signs involving cognitive functions and awareness: Secondary | ICD-10-CM | POA: Diagnosis not present

## 2018-12-18 DIAGNOSIS — M542 Cervicalgia: Secondary | ICD-10-CM

## 2018-12-18 DIAGNOSIS — M170 Bilateral primary osteoarthritis of knee: Secondary | ICD-10-CM | POA: Diagnosis not present

## 2018-12-18 DIAGNOSIS — I6930 Unspecified sequelae of cerebral infarction: Secondary | ICD-10-CM | POA: Diagnosis not present

## 2018-12-18 MED ORDER — PREDNISONE 10 MG PO TABS: 10.0000 mg | ORAL_TABLET | Freq: Every day | ORAL | 0 refills | Status: DC

## 2018-12-18 MED ORDER — VENLAFAXINE HCL ER 225 MG PO TB24: 225.0000 mg | ORAL_TABLET | Freq: Every morning | ORAL | 1 refills | Status: DC

## 2018-12-18 NOTE — Progress Notes (Signed)
Subjective:    Patient ID: [name] [name], male    DOB: [birth year], 78 y.o.   MRN: [id number]  HPI  10/12/16 Wife brings him patient for evaluation to determine should he still be driving. Past medical history is significant for progressive multiple sclerosis, multiple ischemic strokes, chronic osteoarthritic pain on 3 time a day hydrocodone WHICH contribute to this discussion. Physically, the patient has muscle strength 5 over 5 equal and symmetric in the arms and in the legs with normal reflexes. His physical reaction time is normal to catching a dropped object. Cerebellar exam is normal today however on Mini-Mental status exam, the patient tells me that the year is 1983. He is unable to correctly identify the month. He can #2 out of 3 objects on recall. He scores 3 out of 5 on world spelling in reverse. He is unable to perform serial sevens. It takes him 25 seconds to draw a clock although he does draw correctly. However he is unable to recognize patterns or to correctly copy intersecting pentagons.  At that time, my plan was: Patient has age-related memory loss compounded by multiple ischemic strokes, compounded by progressive multiple sclerosis, compounded by polypharmacy and chronic narcotic use all of which makes him a danger to drive. In general, I recommended that the patient not drive. Legally, I believe he would still qualify for restricted status. He must drive less than 45 miles an hour. He must avoid Interstate driving or driving on highways and remain driving only on 2 lane roads where there is less travel. I would not recommend driving at night or during conditions that would compromise his reaction time. After discussing these restrictions I explained to the patient and his wife that if he were my father, I would not recommend driving.  46/56/81 Patient's wife removed his car and had it placed at her daughter's home. This angered the patient dramatically. Furthermore, the patient's  wife is concerned that he is extremely depressed and that he may harm himself. She also feels threatened and the home due to his outbursts of uncontrolled anger. Therefore she had her daughter and son-in-law will remove the guns from his home. Patient feels extremely upset by this. He feels disrespected. He feels violated. Patient's wife states that he is extremely angry. His mood swings are rapid and fluctuating. He is extremely depressed. He reports anhedonia. He is frustrated by his memory loss. He is frustrated by his inability to drive. He is frustrated by having his personal items removed from his home without his consent. He feels like he is losing his independence and his masculinity. In the past he has had symptoms of depression with bipolar tendencies. At one time we had him on Depakote as a mood stabilizer which seemed to help dramatically. He is also taking Abilify but this was discontinued due to weight gain. Patient's wife feels unsafe at home. Patient denies suicidality. Patient denies any homicidal ideation. He states that his wife is blowing things out of proportion.  At that time, my plan was: I feel both the patient and his wife have valid arguments. The wife does not feel safe with him having guns in the home nor does she feel safe with him driving. Patient is frustrated by his loss of independence and the perceived disrespect of having his guns and car taken from him without his consent. Wife states that she did not consent the patient because she was afraid of his uncontrolled anger when she broached the subject with  him couple of his memory loss and poor judgment and impulsivity. Therefore I will treat the patient with Abilify 5 mg by mouth daily as a mood stabilizer and management for depression and recheck the patient in 2 weeks. I have agreed that he should not be driving. I have also agreed that there should not be loaded weapons in the home. However I agreed that the patient should be  asked and talked to is a human being and that showing him disrespect may help mollify some of his anger and frustration. Patient and wife agree to try  09/13/17 Patient is here today with his wife for follow-up.  His memory seems to be worsening dramatically.  He recently met with an occupational therapist, however, who has cleared him to drive back and forth to the grocery store with his wife present in the car.  However I am concerned by his apparently worsening memory.  I performed a Mini-Mental status exam today.  The patient is able to tell me the date correctly although it takes him a little bit of time.  He is able to tell me the location quickly.  He is able to remember 3 out of 3 objects quickly however on recall he can only recall 2 out of the 3.  Patient has a difficult time performing serial sevens.  He is able to perform for correctly out of 5 however it takes him repeated prompts and multiple attempts to make the correct answer and this is a patient who has a graduate degree in mathematics.  I performed testing on pattern recognition drawing a line from a to 1, 1 to B, B to 2.  Patient was unable to recognize the pattern despite explaining to me what the pattern was and that the numbers correspond to the previous letter.  He was unable to then draw the correct line to the next letter and number in that pattern.  On clock drawing, he correctly draws a circle with the correct numbers however they are all jumbled at the end with poor spacing.  He then forgot the time I told him to draw.  All of this to me in a patient with a graduate college degree shows early short-term memory problems and slight cognitive impairment.  I believe this is primarily due to vascular dementia from his numerous strokes coupled with possible MS.  They are here today to discuss treatment options.  At that time, my plan was: I have recommended that the patient begin Aricept 5 mg a day and I would like to recheck him in 1 month  and uptitrate to 10 mg if tolerable.  I will check a CBC, CMP, vitamin B12, and TSH.  I believe the patient is suffering from a combination of vascular dementia as well as his underlying MS. I have also recommended avoiding polypharmacy and I have strongly encouraged the wife to manage his medications.  I want to limit his pain medication as much as possible.  He can no longer self administer the medication.  I have recommended that his wife hold the medication and give it to him only when he is in significant pain.  Otherwise I have encouraged Tylenol.  I believe the patient has poor impulse control and frequently may take 2 or 3 pain pills at a time when he is hurting which I believe further exacerbates his underlying cognitive impairment.  Reassess in 1 month  10/13/17 Patient is tolerating Aricept 5 mg a day.  He denies any  abnormal dreams, hallucinations, stomach upset, dizziness, or depression.  Obviously, patient nor his family can tell any difference.  Both he and his wife are arguing throughout the visit today.  His wife is essentially trying to keep him at home.  She will not let him drive a car which I agree with.  She will not let him operate a lot more.  She is not letting him go to the gym unless she accompanies him.  Although this leaves the patient feeling suffocated and stifled.  This leads to arguments and frustration which are causing significant problems in the marriage. At that time, my plan was: Increase Aricept to 10 mg a day.  I discussed with the wife allowing the patient time to be away from her.  I agree that he should not be driving a car.  However I see no serious risk in him mowing the yard at his current state.  I also believe that if she can have a family member drive him to the gym 2 to 3 days a week to allow him to get exercise this would give him an outlet to release his energy and frustration as well as provide her therapeutic break to help prevent and treat caregiver  fatigue.  12/05/17 Recently admitted with AMS.  I have copied relevant portions of the DC summary below for reference:  Admit date: 11/16/2017 Discharge date: 11/18/2017  Admitted From: home  Disposition:  home    Home Health:  ordered     Discharge Condition:  stable   CODE STATUS:  DNR   Consultations:  none    Discharge Diagnoses:  Principal Problem:   Acute encephalopathy Active Problems:   MS (multiple sclerosis) ([place])   History of stroke   Cognitive impairment   Essential hypertension       Brief Summary: [name] [name] a 78 y.o.malewith medical history significant ofdementia,multiple ischemic CVAss/p ILR placement and on eliquis, Brugada pattern on ECG, COPD, progressiveMS, non occlusive CAD.  Patient sent in to ED by wife with c/o confusion and agitation. Per wife patient has been increasingly confused, with agitation and aggression at times since Monday. No PO meds since Monday.   Hospital Course:  Acute encephalopathy - initially thought to be Wernicke's encephalopathy due to daily drinking however, no major improvement noted with Thiamine via IV but he is taking his medications appropriately - no underlying infections or abnormal blood work  - CT head and MRI noted below which are unrevealing - I have reviewed the notes from his PCP and I feel that his dementia is progressing-  - PT recommended CIR but his wife is wanting to take him home with home health to continue taking care of him  - she does state that he is getting agitated at night and also that Abilify has never helped - thus I have d/c'd the Abilify (per psych, this does need to be tapered) and started Seroquel at bedtime- she can start with 25 and give an extra dose of 25 if it is not helping but not go higher than this dose. Advised for her to take him to his PCP in about 3-4 days for a follow up.  12/05/17 Patient is here today for follow-up.  He is accompanied by  his wife.  His blood pressure has been elevated.  He called our on-call doctor and his doxazosin was increased from 2 mg a day to 4 mg a day.  However the patient has not been taking his  losartan.  Apparently he ran out of this several weeks ago.  He is also abruptly discontinued his Effexor approximately 3 weeks ago around the time he was hospitalized.  His wife states that he is back to his baseline.  She denies any further hypersomnolence or hallucinations since he has been discharged from the hospital.  He is taking Seroquel at bedtime.  He has discontinued the Abilify.  Together we reviewed his medication list at length.  He uses Requip for restless leg syndrome however I am concerned about the patient receiving a dopaminergic medication given the fact he is hallucinating at times.  I am also concerned about his polypharmacy.  Therefore I recommended discontinuing this.  I am concerned about his abrupt discontinuation of Effexor possibly exacerbating his altered mental status.  Therefore I recommended that we resume this albeit at a lower dose.  He continues to drink 2-3 hard liquor drinks a day.  He has been greatly reduced his consumption of pain medication and is using only 2 to 3 pills a week for his arthritic pain.  At that time, my planw as: The majority of our encounter today was spent discussing polypharmacy.  I recommended discontinuation of Requip given his recent hallucinations and altered mental status.  I will try to minimize the amount of centrally acting medication in this patient as I believe his dementia is worsening.  Therefore I am encouraged the patient to discontinue Requip.  I also encourage the patient to reduce his consumption of alcohol to avoid worsening his dementia and causing increased confusion.  I continue to encourage the family to decrease the use of narcotics.  We will discontinue pantoprazole as the patient does not seem to require this at the present time.  I will have him  resume his losartan 100 mg a day in addition to doxazosin 2 mg a day and metoprolol 150 mg a day and recheck his blood pressure next week to see how he is doing.  12/12/17 Since discontinuing Requip, the patient has not had any problems with restless leg.  He is not flailing in bed at night.  He is not kicking his wife.  It is not keeping him awake.  His wife states that he is slept without any difficulty.  They also have not seen any deterioration in his memory or level of consciousness.  He has had no further hallucinations.  Also he has noticed no difficulty after discontinuing Protonix.  He denies any heartburn or reflux.  His blood pressure here today is between 110 and 120/60.  I rechecked his blood pressure personally and found it to be similar.  His home blood pressures have been higher however they are still in the 130s to 280 range systolic.  Overall he is doing well with no concerns.  His wife is been able to decrease his pain medication to 2 or 3 a week.  She denies any sundowning.  She denies any delirium.  Overall he is been doing much better since leaving the hospital.  I suspect that some of his altered mental status may have been the abrupt discontinuation of venlafaxine.  Since he has resumed the medication, he has been much calmer.  At that time, my plan was: Blood pressure today is excellent.  Continue losartan, metoprolol, and Cardura at their current doses.  I have asked the patient to continue to refrain from using Requip and Protonix.  I have recommended that we continue to try to limit and minimize any polypharmacy.  Reassess the patient in 3 months or as needed as problems arise.  01/06/18 Patient's blood pressure has been elevated at home.  Blood pressure has been ranging between 675 and 916 systolic over 38-46 diastolic.  He is only been taking Cardura 2 mg.  Wife is concerned that we need to increase his Effexor because he is becoming more anxious.  On exam today, patient  demonstrates some tardive dyskinesia.  He is also demonstrating some motor tics.  He is grunting frequently for no reason.  He is also making unusual facial grimaces at times and seems to demonstrate akathisia as he is sitting.  He is rocking from side to side in a sinuous motion.  Today on his exam, he also has tachycardia.  Blood pressure is elevated at 150/70.  At that time, my plan was: I question if the akathisia, tardive dyskinesia is related to the Seroquel.  I recommended discontinuation of this.  I would not increase venlafaxine at the present time but would rather wait to see the patient's response to holding the Seroquel.  Meanwhile I would increase the Cardura to 4 mg a day because of his elevated blood pressure.  Given the tachycardia I will obtain an EKG.  EKG today shows normal sinus rhythm with a heart rate of 99 bpm.  There is no evidence of ischemia or infarction.  I recommended that the patient take an extra 50 mg of metoprolol today and then I have asked his wife to monitor his heart rate more closely at home and if greater than 90 on a consistent basis we may need to permanently increase his Toprol-XL to 150 mg a day   03/10/18 Ultimately, we put the patient back on Requip for the extraparametal symptoms..  Please see the phone notes after the patient's last office visit.  Per the patient's wife's report, the facial grimacing and motor tics improved after resuming the Requip however his depression worsened.  What has exacerbated the situation even more is a week ago, he ran out of his Effexor due to a problem with the mail order.  He has not been on his Effexor for more than a week.  Over that period of time he is more tearful.  He reports feeling more depressed.  However his depression was worsening prior to that.  With also worsening his his insight.  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  He wants to go back to college and study biology.  However the  patient has moderate dementia.  He has poor insight into his capabilities.  He is unable to drive a car.  He is unable to sustain employment.  Every day he ask his wife about doing these things causing stress on her.  Interns she is not able to take him to the gym or to do the things that he is interested in doing and he feels isolated and alone and I think this is exacerbating his depression.  Please see his wife's office visit, Jayziah Bankhead 03/10/18) for further insight.  AT that time, my plan was: Patient shows poor insight.  His memory loss is gradually worsening.  He also shows mood swings and worsening depression.  I have recommended increasing his Effexor to 225 mg p.o. every morning and then reassessing the patient in 1 month.  As I recommended for his wife I believe the best thing for both of them with his transition into a retirement community such as  [facility] [facility].  This would remove some of the responsibility for caring for her dog from her shoulders.  I believe the sense of community would give him access to a gym another man his age with whom he could interact with and do things with.  I believe this would reinvigorate the patient and remove some of the frustration he feels that his declining health.  I believe this would also help her not feel is helpless and overwhelmed.  12/18/18 Patient is here today with his wife.  He states that he is in constant pain.  He hurts in his neck and his lower back.  He recently had epidural steroid injections which have provided him no relief.  He also complains of pain in his left first MCP joint that is severe.  He is having debilitating pain in his knees.  He has been taking hydrocodone which he only gives him minimal relief.  Instead he has been taking ibuprofen and aspirin which does provide some relief and allows him some semblance of a quality of life.  However the patient is on anticoagulation given his previous history of stroke with Eliquis.  I explained  to him the risk of GI bleed given the combination of aspirin, Eliquis, and ibuprofen however at the present time the patient states he is in such severe pain with no quality of life he is willing to take that risk.  Wife believes the pain is causing him to be extremely angry.  They do not feel that his dementia is worsening.  They believe that his memory loss is stable although moderate in severity.  However his wife states that he has been increasingly angry.  He recently ran out of Effexor and has been off the medication for several days.  She believes that this is likely contributing to the mood swings.  He denies any suicidal ideation.  Past Medical History:  Diagnosis Date  . Abnormal EKG    Possible Type II Brugada ECG pattern. No family history of SCD, no syncope, no tachypalpitations.  . Anxiety   . Arthritis    s/p TKR  . Bipolar disorder ([place])    "touches"     . Bladder cancer (HCC)    [facility], [facility] low grade papillary urothileal carcinoma  . BPH (benign prostatic hyperplasia)   . CAD (coronary artery disease)    a. LHC 1/15 - mid LCx 50 and 60%, proximal RCA 50%  . Chronic diastolic CHF (congestive heart failure) ([place])   . CKD (chronic kidney disease)   . COPD (chronic obstructive pulmonary disease) ([place])    Prior heavy smoker. PFTs (3/11): FVC 87%, FEV1 73%, ratio 0.57, DLCO 75%, TLC 121%. Moderate obstructive defect. PFTs (9/15): Only minimal obstruction, sugPrior heavy smoker. PFTs (3/11): FVC 87%, FEV1 73%, ratio 0.57, DLCO 75%, TLC 121%. Moderate obstructive defect. PFTs (9/15) minimal obstruction, poss asthma component   . Depression    with bipolar tendencies  . DOE (dyspnea on exertion)    a. Myoview 8/09- EF 57%, no ischemia // b. Myoview (3/11) EF 68%, diaphragmatic attenuation, no ischemia //  c Echo (4/11) EF 32-12%, mild diastolic dysfunction, PASP 38 mmHg // d. PFTs 9/15 minimal obstruction  /  e. CT negative for ILD  . GERD (gastroesophageal reflux disease)    not  current  . History of Doppler ultrasound    Carotid US 3/11 negative for significant stenosis  //  carotid US 6/17: Mild bilateral plaque, 1-39% ICA  . History of  echocardiogram    a. Echo 12/14 Inferior and distal septal HK, mild LVH, EF 50-55%, mild MR, mild LAE  //  b. Echo 6/17: Mild focal basal septal hypertrophy, EF 60-65%, normal wall motion, grade 1 diastolic dysfunction, mild AI  . HLD (hyperlipidemia)   . Hypertensive heart disease with CHF (congestive heart failure) ([place]) 04/08/2009  . Low testosterone   . Lung nodule    a. CT in 2015 >> PET in 12/15 not sugg of malignancy // CT 6/19: continue to look benign  . LVH (left ventricular hypertrophy)    a. Echo 12/14 Inferior and distal septal HK, mild LVH, EF 50-55%, mild MR, mild LAE  . Mild dementia ([place])   . Mitral regurgitation    Echo (4/11) with PISA ERO 0.3 cm^2 and regurgitant volume 41 mL (moderate MR). Echo (12/14) with mild MR.   . MS (multiple sclerosis) ([place])   . MS (multiple sclerosis) ([name])   . OSA (obstructive sleep apnea)   . Raynaud's disease   . Right bundle branch block   . Stroke [facility])    3 06/2015- 06/2015  memory loss  . Thoracic aortic aneurysm (HCC)    4.1 cm 2017 // Chest CTA 6/19: stable ascending thoracic aortic aneurysm 4.1 cm, aortic atherosclerosis, stable RML nodular densities (appears benign), emphysema >> FU 1 year // Chest CTA 10/2018: TAA 4.1 cm (stable); 2.2 x 1.6 cm RLL density (appears enlarged); new 6 mm LUL nodule   . Urinary retention with incomplete bladder emptying    receives botox injections and treatment for BPH through [facility]   Past Surgical History:  Procedure Laterality Date  . APPENDECTOMY    . CARPOMETACARPAL (CMC) FUSION OF THUMB Left 09/07/2018   Procedure: Left thumb basilar joint arthroplasty and surgery as indicated;  Surgeon: Verner Mould, MD;  Location: [place];  Service: Orthopedics;  Laterality: Left;  55mn  . COLONOSCOPY    . EP IMPLANTABLE DEVICE N/A  06/23/2015   Procedure: Loop Recorder Insertion;  Surgeon: SDeboraha Sprang MD;  [place] [facility];  Service: Cardiovascular;  Laterality: N/A;  . HEMORRHOID SURGERY    . KNEE ARTHROPLASTY Left   . LEFT HEART CATHETERIZATION WITH CORONARY ANGIOGRAM N/A 01/05/2013   Procedure: LEFT HEART CATHETERIZATION WITH CORONARY ANGIOGRAM;  Surgeon: DLarey Dresser MD;  [facility];  Service: Cardiovascular;  Laterality: N/A;  . ROTATOR CUFF REPAIR Right    x 2  . TEE WITHOUT CARDIOVERSION N/A 06/23/2015   Procedure: TRANSESOPHAGEAL ECHOCARDIOGRAM (TEE);  Surgeon: PFay Records MD;  [facility];  Service: Cardiovascular;  Laterality: N/A;  . TONSILLECTOMY    . TOTAL KNEE ARTHROPLASTY Right   . TRANSURETHRAL RESECTION OF PROSTATE     Current Outpatient Medications on File Prior to Visit  Medication Sig Dispense Refill  . acetaminophen (TYLENOL) 500 MG tablet Take 1,000 mg by mouth 2 (two) times daily.    .Marland Kitchenatorvastatin (LIPITOR) 40 MG tablet TAKE 1 TABLET DAILY 90 tablet 3  . B Complex-C (B-COMPLEX WITH VITAMIN C) tablet Take 1 tablet by mouth daily.    . Cholecalciferol (VITAMIN D) 50 MCG (2000 UT) CAPS Take 2,000 Units by mouth daily.    .Marland Kitchendonepezil (ARICEPT) 10 MG tablet Take 1 tablet (10 mg total) by mouth at bedtime. 90 tablet 3  . doxazosin (CARDURA) 4 MG tablet Take 1 tablet (4 mg total) by mouth daily. 90 tablet 3  . ELIQUIS 5 MG TABS tablet TAKE  1 TABLET TWICE A DAY (Patient taking differently: Take 5 mg by mouth 2 (two) times daily. ) 180 tablet 3  . Flaxseed, Linseed, (FLAXSEED OIL MAX STR) 1300 MG CAPS Take 1,300 mg by mouth daily.    . furosemide (LASIX) 40 MG tablet TAKE 1 TABLET(40 MG) BY MOUTH DAILY (Patient taking differently: Take 40 mg by mouth daily. ) 90 tablet 4  . gabapentin (NEURONTIN) 300 MG capsule Take 1 capsule (300 mg total) by mouth 2 (two) times daily. Patient taking 374m twice daily 180 capsule 4  . HYDROcodone-acetaminophen (NORCO) 10-325 MG  tablet Take 1-2 tablets by mouth every 6 (six) hours as needed for moderate pain or severe pain (DX: G89.4, (G35 - MS)). 35 tablet 0  . losartan (COZAAR) 100 MG tablet Take 1 tablet (100 mg total) by mouth daily. 90 tablet 4  . magnesium gluconate (MAGONATE) 500 MG tablet Take 500 mg by mouth daily.    . metoprolol succinate (TOPROL-XL) 100 MG 24 hr tablet Take 1 1/2 tablets (150 mg total) by mouth daily. 135 tablet 4  . PROAIR HFA 108 (90 Base) MCG/ACT inhaler USE 1 TO 2 INHALATIONS EVERY 6 HOURS AS NEEDED FOR WHEEZING OR SHORTNESS OF BREATH (Patient taking differently: 1-2 puffs every 6 (six) hours as needed for wheezing or shortness of breath. ) 25.5 g 4  . rOPINIRole (REQUIP) 1 MG tablet Take 1 tablet (1 mg total) by mouth 2 (two) times a day. 180 tablet 4  . senna-docusate (SENOKOT-S) 8.6-50 MG tablet Take 1 tablet by mouth at bedtime as needed for mild constipation. (Patient taking differently: Take 1 tablet by mouth every other day. )    . thiamine (VITAMIN B-1) 100 MG tablet Take 100 mg by mouth daily.    . Tiotropium Bromide-Olodaterol (STIOLTO RESPIMAT) 2.5-2.5 MCG/ACT AERS Inhale 2 puffs into the lungs daily. 3 Inhaler 3  . Venlafaxine HCl 225 MG TB24 Take 1 tablet (225 mg total) by mouth every morning. 90 tablet 1   No current facility-administered medications on file prior to visit.   Allergies  Allergen Reactions  . Alcohol-Sulfur [Sulfur] Other (See Comments)    Patient was suspected of having "Brugada Syndrome"  . Bupivacaine Other (See Comments)    Patient was suspected of having "Brugada Syndrome"  . Clomipramine Hcl Other (See Comments)    Patient was suspected of having "Brugada Syndrome"  . Flecainide Other (See Comments)    Patient was suspected of having "Brugada Syndrome"  . Lithium Other (See Comments)    Patient was suspected of having "Brugada Syndrome"  . Acetylcholine     Patient was suspected of having "Brugada Syndrome": (BrS) is a genetic condition that  results in abnormal electrical activity within the heart, increasing the risk of sudden cardiac death. Those affected may have episodes of passing out.  . Amitriptyline Other (See Comments)    Patient was suspected of having "Brugada Syndrome"  . Cocaine Other (See Comments)    Patient was suspected of having "Brugada Syndrome"  . Desipramine Other (See Comments)    Patient was suspected of having "Brugada Syndrome"  . Ergonovine Other (See Comments)    Patient was suspected of having "Brugada Syndrome"  . Loxapine Other (See Comments)    Patient was suspected of having "Brugada Syndrome"  . Nortriptyline Other (See Comments)    Patient was suspected of having "Brugada Syndrome"  . Oxcarbazepine Other (See Comments)    Patient was suspected of having "Brugada Syndrome"  . Procainamide  Other (See Comments)    Patient was suspected of having "Brugada Syndrome"  . Procaine Other (See Comments)    Patient was suspected of having "Brugada Syndrome"  . Propafenone Other (See Comments)    Patient was suspected of having "Brugada Syndrome"  . Propofol Other (See Comments)    Patient was suspected of having "Brugada Syndrome"  . Trifluoperazine Other (See Comments)    Patient was suspected of having "Brugada Syndrome"   Social History   Socioeconomic History  . Marital status: Married    Spouse name: Not on file  . Number of children: Not on file  . Years of education: Not on file  . Highest education level: Not on file  Occupational History  . Occupation: retired    Fish farm manager: RETIRED  Tobacco Use  . Smoking status: Current Every Day Smoker    Years: 40.00    Types: Cigars  . Smokeless tobacco: Former Systems developer    Types: Chew  . Tobacco comment: 3-4 a day ,"quit cigarettes years ago"  Substance and Sexual Activity  . Alcohol use: Yes    Alcohol/week: 14.0 standard drinks    Types: 2 Standard drinks or equivalent, 12 Cans of beer per week  . Drug use: No  . Sexual activity: Not on  file  Other Topics Concern  . Not on file  Social History Narrative   Retired from the [employer] after 20 years   [place] Veteran   Social Determinants of Health   Financial Resource Strain:   . Difficulty of Paying Living Expenses: Not on file  Food Insecurity:   . Worried About Charity fundraiser in the Last Year: Not on file  . Ran Out of Food in the Last Year: Not on file  Transportation Needs:   . Lack of Transportation (Medical): Not on file  . Lack of Transportation (Non-Medical): Not on file  Physical Activity:   . Days of Exercise per Week: Not on file  . Minutes of Exercise per Session: Not on file  Stress:   . Feeling of Stress : Not on file  Social Connections:   . Frequency of Communication with Friends and Family: Not on file  . Frequency of Social Gatherings with Friends and Family: Not on file  . Attends Religious Services: Not on file  . Active Member of Clubs or Organizations: Not on file  . Attends Archivist Meetings: Not on file  . Marital Status: Not on file  Intimate Partner Violence:   . Fear of Current or Ex-Partner: Not on file  . Emotionally Abused: Not on file  . Physically Abused: Not on file  . Sexually Abused: Not on file      Review of Systems  All other systems reviewed and are negative.      Objective:   Physical Exam  Constitutional: He is oriented to person, place, and time. He appears well-developed and well-nourished. No distress.  Cardiovascular: Normal rate, regular rhythm, normal heart sounds and intact distal pulses.  No murmur heard. Pulmonary/Chest: Effort normal and breath sounds normal. No respiratory distress. He has no wheezes. He has no rales.  Neurological: He is alert and oriented to person, place, and time. He has normal reflexes. No cranial nerve deficit. He exhibits normal muscle tone. He displays no seizure activity. Coordination normal.  Skin: He is not diaphoretic.  Psychiatric: He has a normal  mood and affect. His behavior is normal. Judgment and thought content normal.  Vitals reviewed.  Assessment & Plan:  Mood swings  Memory loss  Cognitive impairment  Neck pain  Low back pain at multiple sites  Primary osteoarthritis of both knees  I recommended the patient resume Effexor XR 225 mg daily as soon as possible.  I sent a prescription to his pharmacy locally so that he can begin that which I do believe may be contributing some to his mood swings.  I also believe that his mood swings are likely due to his cognitive impairment and memory loss.  I believe he gets frustrated quickly and easily.  Both he and his wife tend to argue over what he should be doing.  I believe he gets frustrated quickly because he feels that he is losing his independence.  I will have a low threshold for adding Namenda.  However at the present time I believe the majority of his symptoms are exacerbated by his chronic pain.  He does not want to take more hydrocodone as he feels that this drug makes him "drunk".  I do not want him taking NSAIDs with Eliquis as I believe this is dangerous.  Therefore, the patient is left with very few options.  We discussed the risk of long-term prednisone.  Although this is not ideal, I do believe that 10 mg of prednisone may provide him some relief from his neck pain, back pain, and knee pain and allow a better quality of life.  I believe this would be less risky than taking NSAIDs or more sedative pain medication given his memory impairment.  Therefore we will try prednisone 10 mg a day and I will reassess the patient in 2 weeks.  If he is seeing no benefit at that time we will discontinue the prednisone due to the potential long-term complications.

## 2018-12-19 ENCOUNTER — Encounter: Payer: Self-pay | Admitting: Family Medicine

## 2018-12-19 MED ORDER — ESCITALOPRAM OXALATE 10 MG PO TABS: 10.0000 mg | ORAL_TABLET | Freq: Every day | ORAL | 2 refills | Status: DC

## 2018-12-19 MED ORDER — VENLAFAXINE HCL ER 225 MG PO TB24: 225.0000 mg | ORAL_TABLET | Freq: Every morning | ORAL | 3 refills | Status: DC

## 2019-01-01 ENCOUNTER — Ambulatory Visit (INDEPENDENT_AMBULATORY_CARE_PROVIDER_SITE_OTHER): Payer: Medicare Other | Admitting: Family Medicine

## 2019-01-01 ENCOUNTER — Encounter: Payer: Self-pay | Admitting: Family Medicine

## 2019-01-01 ENCOUNTER — Other Ambulatory Visit: Payer: Self-pay

## 2019-01-01 VITALS — BP 138/76 | HR 67 | Temp 98.6°F | Resp 16 | Ht 71.0 in | Wt 234.0 lb

## 2019-01-01 DIAGNOSIS — R7303 Prediabetes: Secondary | ICD-10-CM

## 2019-01-01 DIAGNOSIS — R413 Other amnesia: Secondary | ICD-10-CM

## 2019-01-01 DIAGNOSIS — R4189 Other symptoms and signs involving cognitive functions and awareness: Secondary | ICD-10-CM

## 2019-01-01 DIAGNOSIS — I6930 Unspecified sequelae of cerebral infarction: Secondary | ICD-10-CM

## 2019-01-01 DIAGNOSIS — R4586 Emotional lability: Secondary | ICD-10-CM

## 2019-01-01 NOTE — Progress Notes (Signed)
Subjective:    Patient ID: [name] [name], male    DOB: Sep 27, 1940, 78 y.o.   MRN: [id number]  HPI  10/12/16 Wife brings him patient for evaluation to determine should he still be driving. Past medical history is significant for progressive multiple sclerosis, multiple ischemic strokes, chronic osteoarthritic pain on 3 time a day hydrocodone WHICH contribute to this discussion. Physically, the patient has muscle strength 5 over 5 equal and symmetric in the arms and in the legs with normal reflexes. His physical reaction time is normal to catching a dropped object. Cerebellar exam is normal today however on Mini-Mental status exam, the patient tells me that the year is 1983. He is unable to correctly identify the month. He can #2 out of 3 objects on recall. He scores 3 out of 5 on world spelling in reverse. He is unable to perform serial sevens. It takes him 25 seconds to draw a clock although he does draw correctly. However he is unable to recognize patterns or to correctly copy intersecting pentagons.  At that time, my plan was: Patient has age-related memory loss compounded by multiple ischemic strokes, compounded by progressive multiple sclerosis, compounded by polypharmacy and chronic narcotic use all of which makes him a danger to drive. In general, I recommended that the patient not drive. Legally, I believe he would still qualify for restricted status. He must drive less than 45 miles an hour. He must avoid Interstate driving or driving on highways and remain driving only on 2 lane roads where there is less travel. I would not recommend driving at night or during conditions that would compromise his reaction time. After discussing these restrictions I explained to the patient and his wife that if he were my father, I would not recommend driving.  40/97/35 Patient's wife removed his car and had it placed at her daughter's home. This angered the patient dramatically. Furthermore, the patient's  wife is concerned that he is extremely depressed and that he may harm himself. She also feels threatened and the home due to his outbursts of uncontrolled anger. Therefore she had her daughter and son-in-law will remove the guns from his home. Patient feels extremely upset by this. He feels disrespected. He feels violated. Patient's wife states that he is extremely angry. His mood swings are rapid and fluctuating. He is extremely depressed. He reports anhedonia. He is frustrated by his memory loss. He is frustrated by his inability to drive. He is frustrated by having his personal items removed from his home without his consent. He feels like he is losing his independence and his masculinity. In the past he has had symptoms of depression with bipolar tendencies. At one time we had him on Depakote as a mood stabilizer which seemed to help dramatically. He is also taking Abilify but this was discontinued due to weight gain. Patient's wife feels unsafe at home. Patient denies suicidality. Patient denies any homicidal ideation. He states that his wife is blowing things out of proportion.  At that time, my plan was: I feel both the patient and his wife have valid arguments. The wife does not feel safe with him having guns in the home nor does she feel safe with him driving. Patient is frustrated by his loss of independence and the perceived disrespect of having his guns and car taken from him without his consent. Wife states that she did not consent the patient because she was afraid of his uncontrolled anger when she broached the subject with  him couple of his memory loss and poor judgment and impulsivity. Therefore I will treat the patient with Abilify 5 mg by mouth daily as a mood stabilizer and management for depression and recheck the patient in 2 weeks. I have agreed that he should not be driving. I have also agreed that there should not be loaded weapons in the home. However I agreed that the patient should be  asked and talked to is a human being and that showing him disrespect may help mollify some of his anger and frustration. Patient and wife agree to try  09/13/17 Patient is here today with his wife for follow-up.  His memory seems to be worsening dramatically.  He recently met with an occupational therapist, however, who has cleared him to drive back and forth to the grocery store with his wife present in the car.  However I am concerned by his apparently worsening memory.  I performed a Mini-Mental status exam today.  The patient is able to tell me the date correctly although it takes him a little bit of time.  He is able to tell me the location quickly.  He is able to remember 3 out of 3 objects quickly however on recall he can only recall 2 out of the 3.  Patient has a difficult time performing serial sevens.  He is able to perform for correctly out of 5 however it takes him repeated prompts and multiple attempts to make the correct answer and this is a patient who has a graduate degree in mathematics.  I performed testing on pattern recognition drawing a line from a to 1, 1 to B, B to 2.  Patient was unable to recognize the pattern despite explaining to me what the pattern was and that the numbers correspond to the previous letter.  He was unable to then draw the correct line to the next letter and number in that pattern.  On clock drawing, he correctly draws a circle with the correct numbers however they are all jumbled at the end with poor spacing.  He then forgot the time I told him to draw.  All of this to me in a patient with a graduate college degree shows early short-term memory problems and slight cognitive impairment.  I believe this is primarily due to vascular dementia from his numerous strokes coupled with possible MS.  They are here today to discuss treatment options.  At that time, my plan was: I have recommended that the patient begin Aricept 5 mg a day and I would like to recheck him in 1 month  and uptitrate to 10 mg if tolerable.  I will check a CBC, CMP, vitamin B12, and TSH.  I believe the patient is suffering from a combination of vascular dementia as well as his underlying MS. I have also recommended avoiding polypharmacy and I have strongly encouraged the wife to manage his medications.  I want to limit his pain medication as much as possible.  He can no longer self administer the medication.  I have recommended that his wife hold the medication and give it to him only when he is in significant pain.  Otherwise I have encouraged Tylenol.  I believe the patient has poor impulse control and frequently may take 2 or 3 pain pills at a time when he is hurting which I believe further exacerbates his underlying cognitive impairment.  Reassess in 1 month  10/13/17 Patient is tolerating Aricept 5 mg a day.  He denies any  abnormal dreams, hallucinations, stomach upset, dizziness, or depression.  Obviously, patient nor his family can tell any difference.  Both he and his wife are arguing throughout the visit today.  His wife is essentially trying to keep him at home.  She will not let him drive a car which I agree with.  She will not let him operate a lot more.  She is not letting him go to the gym unless she accompanies him.  Although this leaves the patient feeling suffocated and stifled.  This leads to arguments and frustration which are causing significant problems in the marriage. At that time, my plan was: Increase Aricept to 10 mg a day.  I discussed with the wife allowing the patient time to be away from her.  I agree that he should not be driving a car.  However I see no serious risk in him mowing the yard at his current state.  I also believe that if she can have a family member drive him to the gym 2 to 3 days a week to allow him to get exercise this would give him an outlet to release his energy and frustration as well as provide her therapeutic break to help prevent and treat caregiver  fatigue.  12/05/17 Recently admitted with AMS.  I have copied relevant portions of the DC summary below for reference:  Admit date: 11/16/2017 Discharge date: 11/18/2017  Admitted From: home  Disposition:  home    Home Health:  ordered     Discharge Condition:  stable   CODE STATUS:  DNR   Consultations:  none    Discharge Diagnoses:  Principal Problem:   Acute encephalopathy Active Problems:   MS (multiple sclerosis) ([place])   History of stroke   Cognitive impairment   Essential hypertension       Brief Summary: [name] [name] Willardis a 78 y.o.malewith medical history significant ofdementia,multiple ischemic CVAss/p ILR placement and on eliquis, Brugada pattern on ECG, COPD, progressiveMS, non occlusive CAD.  Patient sent in to ED by wife with c/o confusion and agitation. Per wife patient has been increasingly confused, with agitation and aggression at times since Monday. No PO meds since Monday.   Hospital Course:  Acute encephalopathy - initially thought to be Wernicke's encephalopathy due to daily drinking however, no major improvement noted with Thiamine via IV but he is taking his medications appropriately - no underlying infections or abnormal blood work  - CT head and MRI noted below which are unrevealing - I have reviewed the notes from his PCP and I feel that his dementia is progressing-  - PT recommended CIR but his wife is wanting to take him home with home health to continue taking care of him  - she does state that he is getting agitated at night and also that Abilify has never helped - thus I have d/c'd the Abilify (per psych, this does need to be tapered) and started Seroquel at bedtime- she can start with 25 and give an extra dose of 25 if it is not helping but not go higher than this dose. Advised for her to take him to his PCP in about 3-4 days for a follow up.  12/05/17 Patient is here today for follow-up.  He is accompanied by  his wife.  His blood pressure has been elevated.  He called our on-call doctor and his doxazosin was increased from 2 mg a day to 4 mg a day.  However the patient has not been taking his  losartan.  Apparently he ran out of this several weeks ago.  He is also abruptly discontinued his Effexor approximately 3 weeks ago around the time he was hospitalized.  His wife states that he is back to his baseline.  She denies any further hypersomnolence or hallucinations since he has been discharged from the hospital.  He is taking Seroquel at bedtime.  He has discontinued the Abilify.  Together we reviewed his medication list at length.  He uses Requip for restless leg syndrome however I am concerned about the patient receiving a dopaminergic medication given the fact he is hallucinating at times.  I am also concerned about his polypharmacy.  Therefore I recommended discontinuing this.  I am concerned about his abrupt discontinuation of Effexor possibly exacerbating his altered mental status.  Therefore I recommended that we resume this albeit at a lower dose.  He continues to drink 2-3 hard liquor drinks a day.  He has been greatly reduced his consumption of pain medication and is using only 2 to 3 pills a week for his arthritic pain.  At that time, my planw as: The majority of our encounter today was spent discussing polypharmacy.  I recommended discontinuation of Requip given his recent hallucinations and altered mental status.  I will try to minimize the amount of centrally acting medication in this patient as I believe his dementia is worsening.  Therefore I am encouraged the patient to discontinue Requip.  I also encourage the patient to reduce his consumption of alcohol to avoid worsening his dementia and causing increased confusion.  I continue to encourage the family to decrease the use of narcotics.  We will discontinue pantoprazole as the patient does not seem to require this at the present time.  I will have him  resume his losartan 100 mg a day in addition to doxazosin 2 mg a day and metoprolol 150 mg a day and recheck his blood pressure next week to see how he is doing.  12/12/17 Since discontinuing Requip, the patient has not had any problems with restless leg.  He is not flailing in bed at night.  He is not kicking his wife.  It is not keeping him awake.  His wife states that he is slept without any difficulty.  They also have not seen any deterioration in his memory or level of consciousness.  He has had no further hallucinations.  Also he has noticed no difficulty after discontinuing Protonix.  He denies any heartburn or reflux.  His blood pressure here today is between 110 and 120/60.  I rechecked his blood pressure personally and found it to be similar.  His home blood pressures have been higher however they are still in the 130s to 378 range systolic.  Overall he is doing well with no concerns.  His wife is been able to decrease his pain medication to 2 or 3 a week.  She denies any sundowning.  She denies any delirium.  Overall he is been doing much better since leaving the hospital.  I suspect that some of his altered mental status may have been the abrupt discontinuation of venlafaxine.  Since he has resumed the medication, he has been much calmer.  At that time, my plan was: Blood pressure today is excellent.  Continue losartan, metoprolol, and Cardura at their current doses.  I have asked the patient to continue to refrain from using Requip and Protonix.  I have recommended that we continue to try to limit and minimize any polypharmacy.  Reassess the patient in 3 months or as needed as problems arise.  01/06/18 Patient's blood pressure has been elevated at home.  Blood pressure has been ranging between 245 and 809 systolic over 98-33 diastolic.  He is only been taking Cardura 2 mg.  Wife is concerned that we need to increase his Effexor because he is becoming more anxious.  On exam today, patient  demonstrates some tardive dyskinesia.  He is also demonstrating some motor tics.  He is grunting frequently for no reason.  He is also making unusual facial grimaces at times and seems to demonstrate akathisia as he is sitting.  He is rocking from side to side in a sinuous motion.  Today on his exam, he also has tachycardia.  Blood pressure is elevated at 150/70.  At that time, my plan was: I question if the akathisia, tardive dyskinesia is related to the Seroquel.  I recommended discontinuation of this.  I would not increase venlafaxine at the present time but would rather wait to see the patient's response to holding the Seroquel.  Meanwhile I would increase the Cardura to 4 mg a day because of his elevated blood pressure.  Given the tachycardia I will obtain an EKG.  EKG today shows normal sinus rhythm with a heart rate of 99 bpm.  There is no evidence of ischemia or infarction.  I recommended that the patient take an extra 50 mg of metoprolol today and then I have asked his wife to monitor his heart rate more closely at home and if greater than 90 on a consistent basis we may need to permanently increase his Toprol-XL to 150 mg a day   03/10/18 Ultimately, we put the patient back on Requip for the extraparametal symptoms..  Please see the phone notes after the patient's last office visit.  Per the patient's wife's report, the facial grimacing and motor tics improved after resuming the Requip however his depression worsened.  What has exacerbated the situation even more is a week ago, he ran out of his Effexor due to a problem with the mail order.  He has not been on his Effexor for more than a week.  Over that period of time he is more tearful.  He reports feeling more depressed.  However his depression was worsening prior to that.  With also worsening his his insight.  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  He wants to go back to college and study biology.  However the  patient has moderate dementia.  He has poor insight into his capabilities.  He is unable to drive a car.  He is unable to sustain employment.  Every day he ask his wife about doing these things causing stress on her.  Interns she is not able to take him to the gym or to do the things that he is interested in doing and he feels isolated and alone and I think this is exacerbating his depression.  Please see his wife's office visit, Zandyr Barnhill 03/10/18) for further insight.  AT that time, my plan was: Patient shows poor insight.  His memory loss is gradually worsening.  He also shows mood swings and worsening depression.  I have recommended increasing his Effexor to 225 mg p.o. every morning and then reassessing the patient in 1 month.  As I recommended for his wife I believe the best thing for both of them with his transition into a retirement community such as  [facility].  This would remove some of the responsibility for caring for her dog from her shoulders.  I believe the sense of community would give him access to a gym another man his age with whom he could interact with and do things with.  I believe this would reinvigorate the patient and remove some of the frustration he feels that his declining health.  I believe this would also help her not feel is helpless and overwhelmed.  12/18/18 Patient is here today with his wife.  He states that he is in constant pain.  He hurts in his neck and his lower back.  He recently had epidural steroid injections which have provided him no relief.  He also complains of pain in his left first MCP joint that is severe.  He is having debilitating pain in his knees.  He has been taking hydrocodone which he only gives him minimal relief.  Instead he has been taking ibuprofen and aspirin which does provide some relief and allows him some semblance of a quality of life.  However the patient is on anticoagulation given his previous history of stroke with Eliquis.  I explained  to him the risk of GI bleed given the combination of aspirin, Eliquis, and ibuprofen however at the present time the patient states he is in such severe pain with no quality of life he is willing to take that risk.  Wife believes the pain is causing him to be extremely angry.  They do not feel that his dementia is worsening.  They believe that his memory loss is stable although moderate in severity.  However his wife states that he has been increasingly angry.  He recently ran out of Effexor and has been off the medication for several days.  She believes that this is likely contributing to the mood swings.  He denies any suicidal ideation.  At that time, my plan was: I recommended the patient resume Effexor XR 225 mg daily as soon as possible.  I sent a prescription to his pharmacy locally so that he can begin that which I do believe may be contributing some to his mood swings.  I also believe that his mood swings are likely due to his cognitive impairment and memory loss.  I believe he gets frustrated quickly and easily.  Both he and his wife tend to argue over what he should be doing.  I believe he gets frustrated quickly because he feels that he is losing his independence.  I will have a low threshold for adding Namenda.  However at the present time I believe the majority of his symptoms are exacerbated by his chronic pain.  He does not want to take more hydrocodone as he feels that this drug makes him "drunk".  I do not want him taking NSAIDs with Eliquis as I believe this is dangerous.  Therefore, the patient is left with very few options.  We discussed the risk of long-term prednisone.  Although this is not ideal, I do believe that 10 mg of prednisone may provide him some relief from his neck pain, back pain, and knee pain and allow a better quality of life.  I believe this would be less risky than taking NSAIDs or more sedative pain medication given his memory impairment.  Therefore we will try prednisone 10  mg a day and I will reassess the patient in 2 weeks.  If he is seeing no benefit at that time we will discontinue the prednisone due  to the potential long-term complications.  01/01/19 Ultimately we had to start the patient on Lexapro as his insurance would not cover the Effexor for some reason and it was taking too long to have that refilled.  Therefore he is on 10 mg of Lexapro.  He is also been taking 10 mg of prednisone for his diffuse joint pain secondary to osteoarthritis as mentioned above.  He is here today for follow-up.  Neither the patient nor his wife have seen any improvement since starting the prednisone 10 mg a day.  In fact he still tried to buy some ibuprofen recently at the drugstore despite my warnings against taking NSAIDs with chronic anticoagulation.  This to me highlights the fact that he still in pain and did not see substantial benefit from the prednisone and also highlights his memory issues.  He continues to be extremely irritable per his wife's report.  He frequently loses his temper with her.  Patient's wife requires respite relief.  She essentially is trapped in her home supervising the patient and feels unable to leave the home for any break.  Patient does benefit from supervision to avoid unintended injuries but otherwise is able to perform his ADLs without difficulty.  However the patient's wife is necessary to supervise him to ensure that he takes his medication appropriately and that he does not wander or "get hurt".  She is requesting that I write a letter to the [facility] requesting assistance with aid in supervision of the patient to provide for her respite care.  Patient's last hemoglobin A1c was more than a year ago.  He denies any polyuria or polydipsia.  Past Medical History:  Diagnosis Date  . Abnormal EKG    Possible Type II Brugada ECG pattern. No family history of SCD, no syncope, no tachypalpitations.  . Anxiety   . Arthritis    s/p TKR  . Bipolar disorder ([place])     "touches"     . Bladder cancer (HCC)    [facility], [facility] low grade papillary urothileal carcinoma  . BPH (benign prostatic hyperplasia)   . CAD (coronary artery disease)    a. LHC 1/15 - mid LCx 50 and 60%, proximal RCA 50%  . Chronic diastolic CHF (congestive heart failure) ([place])   . CKD (chronic kidney disease)   . COPD (chronic obstructive pulmonary disease) ([place])    Prior heavy smoker. PFTs (3/11): FVC 87%, FEV1 73%, ratio 0.57, DLCO 75%, TLC 121%. Moderate obstructive defect. PFTs (9/15): Only minimal obstruction, sugPrior heavy smoker. PFTs (3/11): FVC 87%, FEV1 73%, ratio 0.57, DLCO 75%, TLC 121%. Moderate obstructive defect. PFTs (9/15) minimal obstruction, poss asthma component   . Depression    with bipolar tendencies  . DOE (dyspnea on exertion)    a. Myoview 8/09- EF 57%, no ischemia // b. Myoview (3/11) EF 68%, diaphragmatic attenuation, no ischemia //  c Echo (4/11) EF 50-53%, mild diastolic dysfunction, PASP 38 mmHg // d. PFTs 9/15 minimal obstruction  /  e. CT negative for ILD  . GERD (gastroesophageal reflux disease)    not current  . History of Doppler ultrasound    Carotid US 3/11 negative for significant stenosis  //  carotid US 6/17: Mild bilateral plaque, 1-39% ICA  . History of echocardiogram    a. Echo 12/14 Inferior and distal septal HK, mild LVH, EF 50-55%, mild MR, mild LAE  //  b. Echo 6/17: Mild focal basal septal hypertrophy, EF 60-65%, normal wall motion, grade 1 diastolic dysfunction, mild  AI  . HLD (hyperlipidemia)   . Hypertensive heart disease with CHF (congestive heart failure) ([place]) 04/08/2009  . Low testosterone   . Lung nodule    a. CT in 2015 >> PET in 12/15 not sugg of malignancy // CT 6/19: continue to look benign  . LVH (left ventricular hypertrophy)    a. Echo 12/14 Inferior and distal septal HK, mild LVH, EF 50-55%, mild MR, mild LAE  . Mild dementia ([place])   . Mitral regurgitation    Echo (4/11) with PISA ERO 0.3 cm^2 and regurgitant volume 41 mL  (moderate MR). Echo (12/14) with mild MR.   . MS (multiple sclerosis) ([place])   . MS (multiple sclerosis) ([place])   . OSA (obstructive sleep apnea)   . Raynaud's disease   . Right bundle branch block   . Stroke [facility])    3 06/2015- 06/2015  memory loss  . Thoracic aortic aneurysm (HCC)    4.1 cm 2017 // Chest CTA 6/19: stable ascending thoracic aortic aneurysm 4.1 cm, aortic atherosclerosis, stable RML nodular densities (appears benign), emphysema >> FU 1 year // Chest CTA 10/2018: TAA 4.1 cm (stable); 2.2 x 1.6 cm RLL density (appears enlarged); new 6 mm LUL nodule   . Urinary retention with incomplete bladder emptying    receives botox injections and treatment for BPH through [facility]   Past Surgical History:  Procedure Laterality Date  . APPENDECTOMY    . CARPOMETACARPAL (CMC) FUSION OF THUMB Left 09/07/2018   Procedure: Left thumb basilar joint arthroplasty and surgery as indicated;  Surgeon: Verner Mould, MD;  Location: [place];  Service: Orthopedics;  Laterality: Left;  7mn  . COLONOSCOPY    . EP IMPLANTABLE DEVICE N/A 06/23/2015   Procedure: Loop Recorder Insertion;  Surgeon: SDeboraha Sprang MD;  [facility];  Service: Cardiovascular;  Laterality: N/A;  . HEMORRHOID SURGERY    . KNEE ARTHROPLASTY Left   . LEFT HEART CATHETERIZATION WITH CORONARY ANGIOGRAM N/A 01/05/2013   Procedure: LEFT HEART CATHETERIZATION WITH CORONARY ANGIOGRAM;  Surgeon: DLarey Dresser MD;  [facility];  Service: Cardiovascular;  Laterality: N/A;  . ROTATOR CUFF REPAIR Right    x 2  . TEE WITHOUT CARDIOVERSION N/A 06/23/2015   Procedure: TRANSESOPHAGEAL ECHOCARDIOGRAM (TEE);  Surgeon: PFay Records MD;  [facility];  Service: Cardiovascular;  Laterality: N/A;  . TONSILLECTOMY    . TOTAL KNEE ARTHROPLASTY Right   . TRANSURETHRAL RESECTION OF PROSTATE     Current Outpatient Medications on File Prior to Visit  Medication Sig Dispense Refill  . acetaminophen (TYLENOL)  500 MG tablet Take 1,000 mg by mouth 2 (two) times daily.    .Marland Kitchenatorvastatin (LIPITOR) 40 MG tablet TAKE 1 TABLET DAILY 90 tablet 3  . B Complex-C (B-COMPLEX WITH VITAMIN C) tablet Take 1 tablet by mouth daily.    . Cholecalciferol (VITAMIN D) 50 MCG (2000 UT) CAPS Take 2,000 Units by mouth daily.    .Marland Kitchendonepezil (ARICEPT) 10 MG tablet Take 1 tablet (10 mg total) by mouth at bedtime. 90 tablet 3  . doxazosin (CARDURA) 4 MG tablet Take 1 tablet (4 mg total) by mouth daily. 90 tablet 3  . ELIQUIS 5 MG TABS tablet TAKE 1 TABLET TWICE A DAY (Patient taking differently: Take 5 mg by mouth 2 (two) times daily. ) 180 tablet 3  . escitalopram (LEXAPRO) 10 MG tablet Take 1 tablet (10 mg total) by mouth daily. 90 tablet 2  .  Flaxseed, Linseed, (FLAXSEED OIL MAX STR) 1300 MG CAPS Take 1,300 mg by mouth daily.    . furosemide (LASIX) 40 MG tablet TAKE 1 TABLET(40 MG) BY MOUTH DAILY (Patient taking differently: Take 40 mg by mouth daily. ) 90 tablet 4  . gabapentin (NEURONTIN) 300 MG capsule Take 1 capsule (300 mg total) by mouth 2 (two) times daily. Patient taking 380m twice daily 180 capsule 4  . HYDROcodone-acetaminophen (NORCO) 10-325 MG tablet Take 1-2 tablets by mouth every 6 (six) hours as needed for moderate pain or severe pain (DX: G89.4, (G35 - MS)). 35 tablet 0  . losartan (COZAAR) 100 MG tablet Take 1 tablet (100 mg total) by mouth daily. 90 tablet 4  . magnesium gluconate (MAGONATE) 500 MG tablet Take 500 mg by mouth daily.    . metoprolol succinate (TOPROL-XL) 100 MG 24 hr tablet Take 1 1/2 tablets (150 mg total) by mouth daily. 135 tablet 4  . predniSONE (DELTASONE) 10 MG tablet Take 1 tablet (10 mg total) by mouth daily with breakfast. 30 tablet 0  . PROAIR HFA 108 (90 Base) MCG/ACT inhaler USE 1 TO 2 INHALATIONS EVERY 6 HOURS AS NEEDED FOR WHEEZING OR SHORTNESS OF BREATH (Patient taking differently: 1-2 puffs every 6 (six) hours as needed for wheezing or shortness of breath. ) 25.5 g 4  .  rOPINIRole (REQUIP) 1 MG tablet Take 1 tablet (1 mg total) by mouth 2 (two) times a day. 180 tablet 4  . senna-docusate (SENOKOT-S) 8.6-50 MG tablet Take 1 tablet by mouth at bedtime as needed for mild constipation. (Patient taking differently: Take 1 tablet by mouth every other day. )    . thiamine (VITAMIN B-1) 100 MG tablet Take 100 mg by mouth daily.    . Tiotropium Bromide-Olodaterol (STIOLTO RESPIMAT) 2.5-2.5 MCG/ACT AERS Inhale 2 puffs into the lungs daily. 3 Inhaler 3  . Venlafaxine HCl 225 MG TB24 Take 1 tablet (225 mg total) by mouth every morning. 90 tablet 3   No current facility-administered medications on file prior to visit.   Allergies  Allergen Reactions  . Alcohol-Sulfur [Sulfur] Other (See Comments)    Patient was suspected of having "Brugada Syndrome"  . Bupivacaine Other (See Comments)    Patient was suspected of having "Brugada Syndrome"  . Clomipramine Hcl Other (See Comments)    Patient was suspected of having "Brugada Syndrome"  . Flecainide Other (See Comments)    Patient was suspected of having "Brugada Syndrome"  . Lithium Other (See Comments)    Patient was suspected of having "Brugada Syndrome"  . Acetylcholine     Patient was suspected of having "Brugada Syndrome": (BrS) is a genetic condition that results in abnormal electrical activity within the heart, increasing the risk of sudden cardiac death. Those affected may have episodes of passing out.  . Amitriptyline Other (See Comments)    Patient was suspected of having "Brugada Syndrome"  . Cocaine Other (See Comments)    Patient was suspected of having "Brugada Syndrome"  . Desipramine Other (See Comments)    Patient was suspected of having "Brugada Syndrome"  . Ergonovine Other (See Comments)    Patient was suspected of having "Brugada Syndrome"  . Loxapine Other (See Comments)    Patient was suspected of having "Brugada Syndrome"  . Nortriptyline Other (See Comments)    Patient was suspected of  having "Brugada Syndrome"  . Oxcarbazepine Other (See Comments)    Patient was suspected of having "Brugada Syndrome"  . Procainamide Other (See  Comments)    Patient was suspected of having "Brugada Syndrome"  . Procaine Other (See Comments)    Patient was suspected of having "Brugada Syndrome"  . Propafenone Other (See Comments)    Patient was suspected of having "Brugada Syndrome"  . Propofol Other (See Comments)    Patient was suspected of having "Brugada Syndrome"  . Trifluoperazine Other (See Comments)    Patient was suspected of having "Brugada Syndrome"   Social History   Socioeconomic History  . Marital status: Married    Spouse name: Not on file  . Number of children: Not on file  . Years of education: Not on file  . Highest education level: Not on file  Occupational History  . Occupation: retired    Fish farm manager: RETIRED  Tobacco Use  . Smoking status: Current Every Day Smoker    Years: 40.00    Types: Cigars  . Smokeless tobacco: Former Systems developer    Types: Chew  . Tobacco comment: 3-4 a day ,"quit cigarettes years ago"  Substance and Sexual Activity  . Alcohol use: Yes    Alcohol/week: 14.0 standard drinks    Types: 2 Standard drinks or equivalent, 12 Cans of beer per week  . Drug use: No  . Sexual activity: Not on file  Other Topics Concern  . Not on file  Social History Narrative   Retired from the [employer] after 20 years   [place] Veteran   Social Determinants of Health   Financial Resource Strain:   . Difficulty of Paying Living Expenses: Not on file  Food Insecurity:   . Worried About Charity fundraiser in the Last Year: Not on file  . Ran Out of Food in the Last Year: Not on file  Transportation Needs:   . Lack of Transportation (Medical): Not on file  . Lack of Transportation (Non-Medical): Not on file  Physical Activity:   . Days of Exercise per Week: Not on file  . Minutes of Exercise per Session: Not on file  Stress:   . Feeling of Stress :  Not on file  Social Connections:   . Frequency of Communication with Friends and Family: Not on file  . Frequency of Social Gatherings with Friends and Family: Not on file  . Attends Religious Services: Not on file  . Active Member of Clubs or Organizations: Not on file  . Attends Archivist Meetings: Not on file  . Marital Status: Not on file  Intimate Partner Violence:   . Fear of Current or Ex-Partner: Not on file  . Emotionally Abused: Not on file  . Physically Abused: Not on file  . Sexually Abused: Not on file      Review of Systems  All other systems reviewed and are negative.      Objective:   Physical Exam  Constitutional: He is oriented to person, place, and time. He appears well-developed and well-nourished. No distress.  Cardiovascular: Normal rate, regular rhythm, normal heart sounds and intact distal pulses.  No murmur heard. Pulmonary/Chest: Effort normal and breath sounds normal. No respiratory distress. He has no wheezes. He has no rales.  Neurological: He is alert and oriented to person, place, and time. He has normal reflexes. No cranial nerve deficit. He exhibits normal muscle tone. He displays no seizure activity. Coordination normal.  Skin: He is not diaphoretic.  Psychiatric: He has a normal mood and affect. His behavior is normal. Judgment and thought content normal.  Vitals reviewed.  Assessment & Plan:  Prediabetes - Plan: Hemoglobin A1c, CBC with Differential, COMPLETE METABOLIC PANEL WITH GFR  Memory loss  Mood swings  Cognitive impairment  There has not been substantial benefit since starting low-dose prednisone and therefore I will discontinue the medication entirely.  I see no benefit in keeping on the medication if he is still in pain.  I again emphasized the importance of not using NSAIDs with his anticoagulation as this increases the risk of GI bleed.  I would continue to allow the Lexapro to stabilize in his system.   Patient's wife was requesting increasing the dose however he is only been on the medication for less than 2 weeks and it has not so had sufficient time to take effect.  Therefore I would not increase the dose at the present moment.  Patient is overdue to recheck a hemoglobin A1c to monitor his diabetes.  Therefore I will obtain that today.  I do believe the patient would benefit from supervision.  Therefore I will draft a letter on the patient's behalf to the [place].  However he does not require assistance with ADLs only prompting.

## 2019-01-02 LAB — CBC WITH DIFFERENTIAL/PLATELET
Absolute Monocytes: 697 cells/uL (ref 200–950)
Basophils Absolute: 104 cells/uL (ref 0–200)
Basophils Relative: 1 %
Eosinophils Absolute: 406 cells/uL (ref 15–500)
Eosinophils Relative: 3.9 %
HCT: 44.7 % (ref 38.5–50.0)
Hemoglobin: 15.1 g/dL (ref 13.2–17.1)
Lymphs Abs: 2454 cells/uL (ref 850–3900)
MCH: 32.8 pg (ref 27.0–33.0)
MCHC: 33.8 g/dL (ref 32.0–36.0)
MCV: 97.2 fL (ref 80.0–100.0)
MPV: 10.8 fL (ref 7.5–12.5)
Monocytes Relative: 6.7 %
Neutro Abs: 6739 cells/uL (ref 1500–7800)
Neutrophils Relative %: 64.8 %
Platelets: 227 10*3/uL (ref 140–400)
RBC: 4.6 10*6/uL (ref 4.20–5.80)
RDW: 12.6 % (ref 11.0–15.0)
Total Lymphocyte: 23.6 %
WBC: 10.4 10*3/uL (ref 3.8–10.8)

## 2019-01-02 LAB — COMPLETE METABOLIC PANEL WITH GFR
AG Ratio: 1.5 (calc) (ref 1.0–2.5)
ALT: 16 U/L (ref 9–46)
AST: 14 U/L (ref 10–35)
Albumin: 3.8 g/dL (ref 3.6–5.1)
Alkaline phosphatase (APISO): 77 U/L (ref 35–144)
BUN: 25 mg/dL (ref 7–25)
CO2: 28 mmol/L (ref 20–32)
Calcium: 9 mg/dL (ref 8.6–10.3)
Chloride: 104 mmol/L (ref 98–110)
Creat: 1.12 mg/dL (ref 0.70–1.18)
GFR, Est African American: 73 mL/min/{1.73_m2} (ref >=60)
GFR, Est Non African American: 63 mL/min/{1.73_m2} (ref >=60)
Globulin: 2.6 g/dL (calc) (ref 1.9–3.7)
Glucose, Bld: 100 mg/dL — ABNORMAL HIGH (ref 65–99)
Potassium: 4.9 mmol/L (ref 3.5–5.3)
Sodium: 143 mmol/L (ref 135–146)
Total Bilirubin: 0.5 mg/dL (ref 0.2–1.2)
Total Protein: 6.4 g/dL (ref 6.1–8.1)

## 2019-01-02 LAB — HEMOGLOBIN A1C
Hgb A1c MFr Bld: 6.3 % of total Hgb — ABNORMAL HIGH (ref <5.7)
Mean Plasma Glucose: 134 (calc)
eAG (mmol/L): 7.4 (calc)

## 2019-01-04 ENCOUNTER — Encounter: Payer: Self-pay | Admitting: Family Medicine

## 2019-01-04 MED ORDER — HYDROCODONE-ACETAMINOPHEN 10-325 MG PO TABS: 1.0000 | ORAL_TABLET | Freq: Four times a day (QID) | ORAL | 0 refills | Status: DC | PRN

## 2019-01-04 NOTE — Telephone Encounter (Signed)
Ok to refill??  Last office visit 01/01/2019.  Last refill 12/08/2018, #35 tabs.

## 2019-01-09 ENCOUNTER — Encounter: Payer: Self-pay | Admitting: Family Medicine

## 2019-01-09 ENCOUNTER — Other Ambulatory Visit: Payer: Self-pay

## 2019-01-09 MED ORDER — HYDROCODONE-ACETAMINOPHEN 10-325 MG PO TABS: 1.0000 | ORAL_TABLET | Freq: Four times a day (QID) | ORAL | 0 refills | Status: DC | PRN

## 2019-01-09 NOTE — Telephone Encounter (Signed)
Can you resend this to [facility] - it was sent to [facility] last time.

## 2019-01-09 NOTE — Telephone Encounter (Signed)
Requested Prescriptions   Pending Prescriptions Disp Refills  . HYDROcodone-acetaminophen (NORCO) 10-325 MG tablet 35 tablet 0    Sig: Take 1-2 tablets by mouth every 6 (six) hours as needed for moderate pain or severe pain (DX: G89.4, (G35 - MS)).    Rx was sent to wrong pharmacy. Pt would like it sent to [facility].

## 2019-01-23 ENCOUNTER — Encounter: Payer: Self-pay | Admitting: Family Medicine

## 2019-01-23 ENCOUNTER — Other Ambulatory Visit: Payer: Self-pay | Admitting: Family Medicine

## 2019-01-23 MED ORDER — TRELEGY ELLIPTA 100-62.5-25 MCG/INH IN AEPB: 1.0000 | INHALATION_SPRAY | Freq: Every day | RESPIRATORY_TRACT | 3 refills | Status: AC

## 2019-01-24 ENCOUNTER — Encounter: Payer: Self-pay | Admitting: Family Medicine

## 2019-02-05 ENCOUNTER — Telehealth: Payer: Self-pay | Admitting: Family Medicine

## 2019-02-05 ENCOUNTER — Encounter: Payer: Self-pay | Admitting: Family Medicine

## 2019-02-05 NOTE — Telephone Encounter (Signed)
[name] calling to say that pickard in the past has called him in an anti nausea medication, would like to know if something can be called in for him  [phone]

## 2019-02-06 ENCOUNTER — Other Ambulatory Visit: Payer: Self-pay | Admitting: Family Medicine

## 2019-02-06 MED ORDER — ONDANSETRON HCL 4 MG PO TABS: 4.0000 mg | ORAL_TABLET | Freq: Three times a day (TID) | ORAL | 0 refills | Status: DC | PRN

## 2019-02-06 NOTE — Telephone Encounter (Signed)
I called out zofran

## 2019-02-06 NOTE — Telephone Encounter (Signed)
Pt's wife aware.

## 2019-02-13 ENCOUNTER — Telehealth: Payer: Self-pay

## 2019-02-13 NOTE — Telephone Encounter (Signed)
I spoke to wife and she is asking about having pt seen for MS?  (having sleep issues too, ) we have seen for stroke hx.  Having some nausea, dizziness? MS?  I recommended that pt be referred by pcp, Dr. Dennard Schaumann for what he feels is neurological issue and then will see provider to evaluate. MS Dr. Felecia Shelling, has seen Dr. Rexene Alberts for sleep, Erlinda Hong for stroke.  (she did mention has sees neuro in [place]).  She will speak to Dr. Dennard Schaumann.

## 2019-02-13 NOTE — Telephone Encounter (Signed)
Patient wife called wanting to get patient scheduled with Dr.Dohemeier for future care.   Please follow up.

## 2019-02-15 ENCOUNTER — Ambulatory Visit: Payer: Medicare Other | Admitting: Family Medicine

## 2019-02-16 ENCOUNTER — Other Ambulatory Visit: Payer: Self-pay

## 2019-02-16 NOTE — Telephone Encounter (Signed)
Requested Prescriptions   Pending Prescriptions Disp Refills  . HYDROcodone-acetaminophen (NORCO) 10-325 MG tablet 35 tablet 0    Sig: Take 1-2 tablets by mouth every 6 (six) hours as needed for moderate pain or severe pain (DX: G89.4, (G35 - MS)).     Last OV 01/01/2019   Last written 01/09/2019

## 2019-02-19 MED ORDER — HYDROCODONE-ACETAMINOPHEN 10-325 MG PO TABS: 1.0000 | ORAL_TABLET | Freq: Four times a day (QID) | ORAL | 0 refills | Status: DC | PRN

## 2019-02-25 ENCOUNTER — Encounter: Payer: Self-pay | Admitting: Family Medicine

## 2019-02-26 MED ORDER — METOPROLOL SUCCINATE ER 100 MG PO TB24: ORAL_TABLET | ORAL | 4 refills | Status: DC

## 2019-03-05 ENCOUNTER — Other Ambulatory Visit: Payer: Self-pay | Admitting: Family Medicine

## 2019-03-05 MED ORDER — ONDANSETRON HCL 4 MG PO TABS: 4.0000 mg | ORAL_TABLET | Freq: Three times a day (TID) | ORAL | 0 refills | Status: DC | PRN

## 2019-03-23 ENCOUNTER — Other Ambulatory Visit: Payer: Self-pay | Admitting: *Deleted

## 2019-03-23 MED ORDER — ESCITALOPRAM OXALATE 10 MG PO TABS: 10.0000 mg | ORAL_TABLET | Freq: Every day | ORAL | 2 refills | Status: DC

## 2019-03-26 ENCOUNTER — Telehealth: Payer: Self-pay | Admitting: Family Medicine

## 2019-03-26 DIAGNOSIS — Z8673 Personal history of transient ischemic attack (TIA), and cerebral infarction without residual deficits: Secondary | ICD-10-CM

## 2019-03-26 NOTE — Telephone Encounter (Signed)
Per pr's wife  - [facility] for [facility] to have an ultrasound of his  carotid, have a cholesterol panel , and [facility]wy[facility] Dee [facility] [phone] or [phone]. Do I need to make an appt for him to see Dr Dennard Schaumann?  OK to order or does he need to be seen?

## 2019-03-26 NOTE — Telephone Encounter (Signed)
I am fine with ordering carotid dopplers and cmp/flp without ov.

## 2019-03-27 NOTE — Telephone Encounter (Signed)
History of stroke

## 2019-03-27 NOTE — Telephone Encounter (Signed)
What dx should I use for Carotid dopplers?

## 2019-03-28 ENCOUNTER — Other Ambulatory Visit: Payer: Self-pay | Admitting: Family Medicine

## 2019-03-28 DIAGNOSIS — I10 Essential (primary) hypertension: Secondary | ICD-10-CM

## 2019-03-28 DIAGNOSIS — I2510 Atherosclerotic heart disease of native coronary artery without angina pectoris: Secondary | ICD-10-CM

## 2019-03-28 DIAGNOSIS — I110 Hypertensive heart disease with heart failure: Secondary | ICD-10-CM

## 2019-03-28 DIAGNOSIS — E785 Hyperlipidemia, unspecified: Secondary | ICD-10-CM

## 2019-03-28 NOTE — Telephone Encounter (Signed)
Ordered Carotid US and orders placed for labs and will make wife aware.

## 2019-04-05 ENCOUNTER — Encounter: Payer: Self-pay | Admitting: Family Medicine

## 2019-04-05 MED ORDER — HYDROCODONE-ACETAMINOPHEN 10-325 MG PO TABS: 1.0000 | ORAL_TABLET | Freq: Four times a day (QID) | ORAL | 0 refills | Status: DC | PRN

## 2019-04-05 NOTE — Telephone Encounter (Signed)
Patient is requesting a refill on Hydrocodone   LOV: 01/01/2020  LRF:   02/19/2019

## 2019-04-16 ENCOUNTER — Encounter: Payer: Self-pay | Admitting: Family Medicine

## 2019-04-19 ENCOUNTER — Other Ambulatory Visit: Payer: Medicare Other

## 2019-04-19 ENCOUNTER — Inpatient Hospital Stay: Admission: RE | Admit: 2019-04-19 | Payer: Medicare Other | Source: Ambulatory Visit

## 2019-04-20 ENCOUNTER — Other Ambulatory Visit: Payer: Self-pay

## 2019-04-20 ENCOUNTER — Other Ambulatory Visit: Payer: Medicare Other

## 2019-04-20 DIAGNOSIS — E785 Hyperlipidemia, unspecified: Secondary | ICD-10-CM

## 2019-04-20 DIAGNOSIS — I2510 Atherosclerotic heart disease of native coronary artery without angina pectoris: Secondary | ICD-10-CM | POA: Diagnosis not present

## 2019-04-20 DIAGNOSIS — I110 Hypertensive heart disease with heart failure: Secondary | ICD-10-CM

## 2019-04-21 LAB — COMPREHENSIVE METABOLIC PANEL
AG Ratio: 1.8 (calc) (ref 1.0–2.5)
ALT: 13 U/L (ref 9–46)
AST: 14 U/L (ref 10–35)
Albumin: 4.1 g/dL (ref 3.6–5.1)
Alkaline phosphatase (APISO): 83 U/L (ref 35–144)
BUN/Creatinine Ratio: 20 (calc) (ref 6–22)
BUN: 29 mg/dL — ABNORMAL HIGH (ref 7–25)
CO2: 30 mmol/L (ref 20–32)
Calcium: 8.9 mg/dL (ref 8.6–10.3)
Chloride: 106 mmol/L (ref 98–110)
Creat: 1.43 mg/dL — ABNORMAL HIGH (ref 0.70–1.18)
Globulin: 2.3 g/dL (calc) (ref 1.9–3.7)
Glucose, Bld: 136 mg/dL — ABNORMAL HIGH (ref 65–99)
Potassium: 4.4 mmol/L (ref 3.5–5.3)
Sodium: 143 mmol/L (ref 135–146)
Total Bilirubin: 0.4 mg/dL (ref 0.2–1.2)
Total Protein: 6.4 g/dL (ref 6.1–8.1)

## 2019-04-21 LAB — LIPID PANEL
Cholesterol: 116 mg/dL (ref <200)
HDL: 37 mg/dL — ABNORMAL LOW (ref >=40)
LDL Cholesterol (Calc): 53 mg/dL (calc)
Non-HDL Cholesterol (Calc): 79 mg/dL (calc) (ref <130)
Total CHOL/HDL Ratio: 3.1 (calc) (ref <5.0)
Triglycerides: 180 mg/dL — ABNORMAL HIGH (ref <150)

## 2019-04-23 ENCOUNTER — Ambulatory Visit
Admission: RE | Admit: 2019-04-23 | Discharge: 2019-04-23 | Disposition: A | Discharge status: Home / Self Care | Payer: Medicare Other | Source: Ambulatory Visit | Attending: Family Medicine | Admitting: Family Medicine

## 2019-04-23 ENCOUNTER — Other Ambulatory Visit: Payer: Self-pay | Admitting: Family Medicine

## 2019-04-23 DIAGNOSIS — Z8673 Personal history of transient ischemic attack (TIA), and cerebral infarction without residual deficits: Secondary | ICD-10-CM

## 2019-04-23 DIAGNOSIS — I6521 Occlusion and stenosis of right carotid artery: Secondary | ICD-10-CM | POA: Diagnosis not present

## 2019-04-23 DIAGNOSIS — R944 Abnormal results of kidney function studies: Secondary | ICD-10-CM

## 2019-04-24 ENCOUNTER — Other Ambulatory Visit: Payer: Self-pay | Admitting: Family Medicine

## 2019-04-24 ENCOUNTER — Encounter: Payer: Self-pay | Admitting: Family Medicine

## 2019-04-24 MED ORDER — ZOSTER VAC RECOMB ADJUVANTED 50 MCG/0.5ML IM SUSR: 0.5000 mL | Freq: Once | INTRAMUSCULAR | 1 refills | Status: AC

## 2019-04-27 ENCOUNTER — Other Ambulatory Visit: Payer: Self-pay | Admitting: Family Medicine

## 2019-04-27 MED ORDER — HYDROCODONE-ACETAMINOPHEN 10-325 MG PO TABS: 1.0000 | ORAL_TABLET | Freq: Four times a day (QID) | ORAL | 0 refills | Status: DC | PRN

## 2019-05-04 ENCOUNTER — Other Ambulatory Visit: Payer: Medicare Other

## 2019-05-04 ENCOUNTER — Other Ambulatory Visit: Payer: Self-pay

## 2019-05-04 DIAGNOSIS — R944 Abnormal results of kidney function studies: Secondary | ICD-10-CM

## 2019-05-05 LAB — BASIC METABOLIC PANEL
BUN/Creatinine Ratio: 16 (calc) (ref 6–22)
BUN: 23 mg/dL (ref 7–25)
CO2: 27 mmol/L (ref 20–32)
Calcium: 9.9 mg/dL (ref 8.6–10.3)
Chloride: 103 mmol/L (ref 98–110)
Creat: 1.4 mg/dL — ABNORMAL HIGH (ref 0.70–1.18)
Glucose, Bld: 142 mg/dL — ABNORMAL HIGH (ref 65–99)
Potassium: 4.2 mmol/L (ref 3.5–5.3)
Sodium: 142 mmol/L (ref 135–146)

## 2019-05-08 ENCOUNTER — Encounter: Payer: Self-pay | Admitting: Family Medicine

## 2019-06-01 ENCOUNTER — Encounter: Payer: Self-pay | Admitting: Family Medicine

## 2019-06-01 MED ORDER — HYDROCODONE-ACETAMINOPHEN 10-325 MG PO TABS: 1.0000 | ORAL_TABLET | Freq: Four times a day (QID) | ORAL | 0 refills | Status: DC | PRN

## 2019-06-01 NOTE — Telephone Encounter (Signed)
Ok to refill??  Last office visit 01/01/2019.  Last refill 04/27/2019.

## 2019-06-06 ENCOUNTER — Encounter: Payer: Self-pay | Admitting: Family Medicine

## 2019-06-20 ENCOUNTER — Encounter: Payer: Self-pay | Admitting: Family Medicine

## 2019-06-22 MED ORDER — ESCITALOPRAM OXALATE 20 MG PO TABS: 20.0000 mg | ORAL_TABLET | Freq: Every day | ORAL | 1 refills | Status: DC

## 2019-06-22 NOTE — Telephone Encounter (Signed)
Ok to refill for 20mg ?

## 2019-07-02 ENCOUNTER — Telehealth: Payer: Self-pay

## 2019-07-02 NOTE — Telephone Encounter (Signed)
The pt family returned his monitor and states he has dementia. They no longer want the pt to be followed with his loop recorder. The last transmission was received 12-13-2017.

## 2019-07-26 ENCOUNTER — Other Ambulatory Visit: Payer: Self-pay | Admitting: Family Medicine

## 2019-07-31 DIAGNOSIS — C680 Malignant neoplasm of urethra: Secondary | ICD-10-CM | POA: Diagnosis not present

## 2019-07-31 DIAGNOSIS — N320 Bladder-neck obstruction: Secondary | ICD-10-CM | POA: Diagnosis not present

## 2019-07-31 DIAGNOSIS — N139 Obstructive and reflux uropathy, unspecified: Secondary | ICD-10-CM | POA: Diagnosis not present

## 2019-08-14 ENCOUNTER — Encounter: Payer: Self-pay | Admitting: Family Medicine

## 2019-08-15 DIAGNOSIS — D1801 Hemangioma of skin and subcutaneous tissue: Secondary | ICD-10-CM | POA: Diagnosis not present

## 2019-08-15 DIAGNOSIS — L821 Other seborrheic keratosis: Secondary | ICD-10-CM | POA: Diagnosis not present

## 2019-08-15 DIAGNOSIS — C44619 Basal cell carcinoma of skin of left upper limb, including shoulder: Secondary | ICD-10-CM | POA: Diagnosis not present

## 2019-08-15 DIAGNOSIS — C44519 Basal cell carcinoma of skin of other part of trunk: Secondary | ICD-10-CM | POA: Diagnosis not present

## 2019-08-15 DIAGNOSIS — L570 Actinic keratosis: Secondary | ICD-10-CM | POA: Diagnosis not present

## 2019-08-15 DIAGNOSIS — D225 Melanocytic nevi of trunk: Secondary | ICD-10-CM | POA: Diagnosis not present

## 2019-08-21 ENCOUNTER — Other Ambulatory Visit: Payer: Self-pay

## 2019-08-21 ENCOUNTER — Encounter: Payer: Self-pay | Admitting: Family Medicine

## 2019-08-21 ENCOUNTER — Ambulatory Visit (INDEPENDENT_AMBULATORY_CARE_PROVIDER_SITE_OTHER): Payer: Medicare Other | Admitting: Family Medicine

## 2019-08-21 VITALS — BP 120/70 | HR 72 | Temp 97.2°F | Ht 71.0 in | Wt 238.0 lb

## 2019-08-21 DIAGNOSIS — I2510 Atherosclerotic heart disease of native coronary artery without angina pectoris: Secondary | ICD-10-CM | POA: Diagnosis not present

## 2019-08-21 DIAGNOSIS — E785 Hyperlipidemia, unspecified: Secondary | ICD-10-CM

## 2019-08-21 DIAGNOSIS — R413 Other amnesia: Secondary | ICD-10-CM | POA: Diagnosis not present

## 2019-08-21 DIAGNOSIS — I10 Essential (primary) hypertension: Secondary | ICD-10-CM | POA: Diagnosis not present

## 2019-08-21 DIAGNOSIS — Z23 Encounter for immunization: Secondary | ICD-10-CM | POA: Diagnosis not present

## 2019-08-21 DIAGNOSIS — Z8673 Personal history of transient ischemic attack (TIA), and cerebral infarction without residual deficits: Secondary | ICD-10-CM | POA: Diagnosis not present

## 2019-08-21 DIAGNOSIS — R7303 Prediabetes: Secondary | ICD-10-CM

## 2019-08-21 DIAGNOSIS — R4189 Other symptoms and signs involving cognitive functions and awareness: Secondary | ICD-10-CM | POA: Diagnosis not present

## 2019-08-21 MED ORDER — DOXYCYCLINE HYCLATE 100 MG PO TABS
100.0000 mg | ORAL_TABLET | Freq: Two times a day (BID) | ORAL | 0 refills | Status: DC
Start: 2019-08-21 — End: 2019-08-21

## 2019-08-21 MED ORDER — FUROSEMIDE 20 MG PO TABS: 20.0000 mg | ORAL_TABLET | Freq: Every day | ORAL | 3 refills | Status: DC

## 2019-08-21 MED ORDER — DOXYCYCLINE HYCLATE 100 MG PO TABS: 100.0000 mg | ORAL_TABLET | Freq: Two times a day (BID) | ORAL | 0 refills | Status: DC

## 2019-08-21 NOTE — Progress Notes (Signed)
Subjective:    Patient ID: [name] [name], male    DOB: 1940-08-03, 79 y.o.   MRN: [id number]  HPI  Patient is a very pleasant 79 year old Caucasian male here today with his wife.  Past medical history is significant for recurrent strokes.  He also has a history of coronary artery disease that is noncritical.  He also has a history of diastolic dysfunction congestive heart failure.  Patient has prediabetes with his last A1c being 6.3 in December of last year.  He also has a history of hypertension and hyperlipidemia.  Due to a combination of his recurrent strokes as well as his multiple sclerosis, the patient has cognitive decline and dementia.  He also has depression with bipolar tendencies.  The history is provided by both the patient and his wife.  She states that he seems to be doing better since we switch from Effexor to Lexapro.  Overall he seems less agitated and anxious.  He also seems less depressed.  His anger issues have become better managed.  She is requesting that I write a letter on their behalf.  She is managing his finances.  She needs a letter stating that he is medically incapable of managing his finances.  Patient has mild to moderate dementia.  She is writing their checks and paying all their bills and have been doing so since 2016.  However the bank requested that I write a letter on their behalf.  I discussed that today with the patient and he is in agreement for me to do so.  He denies any chest pain shortness of breath or dyspnea on exertion.  He has not been checking his blood sugar.  He denies any polyuria, polydipsia, or blurry vision.  He denies any claudication in his extremities.  Diabetic foot exam was performed today.  The patient has barely palpable dorsalis pedis pulses bilaterally.  Both feet are violaceous in color due to venous congestion.  He has normal sensation to 10 g monofilament.  Wife denies any hallucinations or delusions.  Wife denies any violent behavior.   He is using his pain medication more sparingly now.  She is giving him 2 Tylenol a day which he believes is his "dope pills" as he calls them.  He is actually only taking 1 pain pill per day perhaps even less.  I encouraged her to continue to limit the amount of painkillers he takes to avoid worsening confusion.  Past Medical History:  Diagnosis Date  . Abnormal EKG    Possible Type II Brugada ECG pattern. No family history of SCD, no syncope, no tachypalpitations.  . Anxiety   . Arthritis    s/p TKR  . Bipolar disorder ([place])    "touches"     . Bladder cancer (HCC)    [facility], [facility] low grade papillary urothileal carcinoma  . BPH (benign prostatic hyperplasia)   . CAD (coronary artery disease)    a. LHC 1/15 - mid LCx 50 and 60%, proximal RCA 50%  . Chronic diastolic CHF (congestive heart failure) ([place])   . CKD (chronic kidney disease)   . COPD (chronic obstructive pulmonary disease) ([place])    Prior heavy smoker. PFTs (3/11): FVC 87%, FEV1 73%, ratio 0.57, DLCO 75%, TLC 121%. Moderate obstructive defect. PFTs (9/15): Only minimal obstruction, sugPrior heavy smoker. PFTs (3/11): FVC 87%, FEV1 73%, ratio 0.57, DLCO 75%, TLC 121%. Moderate obstructive defect. PFTs (9/15) minimal obstruction, poss asthma component   . Depression    with bipolar  tendencies  . DOE (dyspnea on exertion)    a. Myoview 8/09- EF 57%, no ischemia // b. Myoview (3/11) EF 68%, diaphragmatic attenuation, no ischemia //  c Echo (4/11) EF 16-10%, mild diastolic dysfunction, PASP 38 mmHg // d. PFTs 9/15 minimal obstruction  /  e. CT negative for ILD  . GERD (gastroesophageal reflux disease)    not current  . History of Doppler ultrasound    Carotid US 3/11 negative for significant stenosis  //  carotid US 6/17: Mild bilateral plaque, 1-39% ICA  . History of echocardiogram    a. Echo 12/14 Inferior and distal septal HK, mild LVH, EF 50-55%, mild MR, mild LAE  //  b. Echo 6/17: Mild focal basal septal hypertrophy, EF 60-65%,  normal wall motion, grade 1 diastolic dysfunction, mild AI  . HLD (hyperlipidemia)   . Hypertensive heart disease with CHF (congestive heart failure) ([place]) 04/08/2009  . Low testosterone   . Lung nodule    a. CT in 2015 >> PET in 12/15 not sugg of malignancy // CT 6/19: continue to look benign  . LVH (left ventricular hypertrophy)    a. Echo 12/14 Inferior and distal septal HK, mild LVH, EF 50-55%, mild MR, mild LAE  . Mild dementia ([place])   . Mitral regurgitation    Echo (4/11) with PISA ERO 0.3 cm^2 and regurgitant volume 41 mL (moderate MR). Echo (12/14) with mild MR.   . MS (multiple sclerosis) ([place])   . MS (multiple sclerosis) ([place])   . OSA (obstructive sleep apnea)   . Raynaud's disease   . Right bundle branch block   . Stroke [facility])    3 06/2015- 06/2015  memory loss  . Thoracic aortic aneurysm (HCC)    4.1 cm 2017 // Chest CTA 6/19: stable ascending thoracic aortic aneurysm 4.1 cm, aortic atherosclerosis, stable RML nodular densities (appears benign), emphysema >> FU 1 year // Chest CTA 10/2018: TAA 4.1 cm (stable); 2.2 x 1.6 cm RLL density (appears enlarged); new 6 mm LUL nodule   . Urinary retention with incomplete bladder emptying    receives botox injections and treatment for BPH through [facility]   Past Surgical History:  Procedure Laterality Date  . APPENDECTOMY    . CARPOMETACARPAL (CMC) FUSION OF THUMB Left 09/07/2018   Procedure: Left thumb basilar joint arthroplasty and surgery as indicated;  Surgeon: Verner Mould, MD;  Location: [place];  Service: Orthopedics;  Laterality: Left;  77min  . COLONOSCOPY    . EP IMPLANTABLE DEVICE N/A 06/23/2015   Procedure: Loop Recorder Insertion;  Surgeon: Deboraha Sprang, MD;  Location: [place] CV LAB;  Service: Cardiovascular;  Laterality: N/A;  . HEMORRHOID SURGERY    . KNEE ARTHROPLASTY Left   . LEFT HEART CATHETERIZATION WITH CORONARY ANGIOGRAM N/A 01/05/2013   Procedure: LEFT HEART CATHETERIZATION WITH CORONARY ANGIOGRAM;   Surgeon: Larey Dresser, MD;  Location[facility] CATH LAB;  Service: Cardiovascular;  Laterality: N/A;  . ROTATOR CUFF REPAIR Right    x 2  . TEE WITHOUT CARDIOVERSION N/A 06/23/2015   Procedure: TRANSESOPHAGEAL ECHOCARDIOGRAM (TEE);  Surgeon: Fay Records, MD;  Location: [facility] ENDOSCOPY;  Service: Cardiovascular;  Laterality: N/A;  . TONSILLECTOMY    . TOTAL KNEE ARTHROPLASTY Right   . TRANSURETHRAL RESECTION OF PROSTATE     Current Outpatient Medications on File Prior to Visit  Medication Sig Dispense Refill  . acetaminophen (TYLENOL) 500 MG tablet Take 1,000 mg by mouth 2 (two) times daily.    Marland Kitchen  atorvastatin (LIPITOR) 40 MG tablet TAKE 1 TABLET DAILY 90 tablet 3  . B Complex-C (B-COMPLEX WITH VITAMIN C) tablet Take 1 tablet by mouth daily.    . Cholecalciferol (VITAMIN D) 50 MCG (2000 UT) CAPS Take 2,000 Units by mouth daily.    Marland Kitchen donepezil (ARICEPT) 10 MG tablet Take 1 tablet (10 mg total) by mouth at bedtime. 90 tablet 3  . doxazosin (CARDURA) 4 MG tablet Take 1 tablet (4 mg total) by mouth daily. 90 tablet 3  . ELIQUIS 5 MG TABS tablet TAKE 1 TABLET TWICE A DAY 180 tablet 3  . escitalopram (LEXAPRO) 20 MG tablet Take 1 tablet (20 mg total) by mouth daily. 90 tablet 1  . Flaxseed, Linseed, (FLAXSEED OIL MAX STR) 1300 MG CAPS Take 1,300 mg by mouth daily.    . Fluticasone-Umeclidin-Vilant (TRELEGY ELLIPTA) 100-62.5-25 MCG/INH AEPB Inhale 1 Inhaler into the lungs daily. 3 each 3  . furosemide (LASIX) 40 MG tablet Take 1 tablet (40 mg total) by mouth daily. 90 tablet 1  . gabapentin (NEURONTIN) 300 MG capsule Take 1 capsule (300 mg total) by mouth 2 (two) times daily. Patient taking 300mg  twice daily 180 capsule 4  . HYDROcodone-acetaminophen (NORCO) 10-325 MG tablet Take 1-2 tablets by mouth every 6 (six) hours as needed for moderate pain or severe pain (DX: G89.4, (G35 - MS)). 35 tablet 0  . losartan (COZAAR) 100 MG tablet Take 1 tablet (100 mg total) by mouth daily. 90 tablet 4  . magnesium  gluconate (MAGONATE) 500 MG tablet Take 500 mg by mouth daily.    . metoprolol succinate (TOPROL-XL) 100 MG 24 hr tablet Take 1 1/2 tablets (150 mg total) by mouth daily. 135 tablet 4  . ondansetron (ZOFRAN) 4 MG tablet Take 1 tablet (4 mg total) by mouth every 8 (eight) hours as needed for nausea or vomiting. 20 tablet 0  . PROAIR HFA 108 (90 Base) MCG/ACT inhaler USE 1 TO 2 INHALATIONS EVERY 6 HOURS AS NEEDED FOR WHEEZING OR SHORTNESS OF BREATH (Patient taking differently: 1-2 puffs every 6 (six) hours as needed for wheezing or shortness of breath. ) 25.5 g 4  . rOPINIRole (REQUIP) 1 MG tablet Take 1 tablet (1 mg total) by mouth 2 (two) times a day. 180 tablet 4  . senna-docusate (SENOKOT-S) 8.6-50 MG tablet Take 1 tablet by mouth at bedtime as needed for mild constipation. (Patient taking differently: Take 1 tablet by mouth every other day. )    . thiamine (VITAMIN B-1) 100 MG tablet Take 100 mg by mouth daily.     No current facility-administered medications on file prior to visit.   Allergies  Allergen Reactions  . Alcohol-Sulfur [Sulfur] Other (See Comments)    Patient was suspected of having "Brugada Syndrome"  . Bupivacaine Other (See Comments)    Patient was suspected of having "Brugada Syndrome"  . Clomipramine Hcl Other (See Comments)    Patient was suspected of having "Brugada Syndrome"  . Flecainide Other (See Comments)    Patient was suspected of having "Brugada Syndrome"  . Lithium Other (See Comments)    Patient was suspected of having "Brugada Syndrome"  . Acetylcholine     Patient was suspected of having "Brugada Syndrome": (BrS) is a genetic condition that results in abnormal electrical activity within the heart, increasing the risk of sudden cardiac death. Those affected may have episodes of passing out.  . Amitriptyline Other (See Comments)    Patient was suspected of having "Brugada Syndrome"  .  Cocaine Other (See Comments)    Patient was suspected of having "Brugada  Syndrome"  . Desipramine Other (See Comments)    Patient was suspected of having "Brugada Syndrome"  . Ergonovine Other (See Comments)    Patient was suspected of having "Brugada Syndrome"  . Loxapine Other (See Comments)    Patient was suspected of having "Brugada Syndrome"  . Nortriptyline Other (See Comments)    Patient was suspected of having "Brugada Syndrome"  . Oxcarbazepine Other (See Comments)    Patient was suspected of having "Brugada Syndrome"  . Procainamide Other (See Comments)    Patient was suspected of having "Brugada Syndrome"  . Procaine Other (See Comments)    Patient was suspected of having "Brugada Syndrome"  . Propafenone Other (See Comments)    Patient was suspected of having "Brugada Syndrome"  . Propofol Other (See Comments)    Patient was suspected of having "Brugada Syndrome"  . Trifluoperazine Other (See Comments)    Patient was suspected of having "Brugada Syndrome"   Social History   Socioeconomic History  . Marital status: Married    Spouse name: Not on file  . Number of children: Not on file  . Years of education: Not on file  . Highest education level: Not on file  Occupational History  . Occupation: retired    Fish farm manager: RETIRED  Tobacco Use  . Smoking status: Current Every Day Smoker    Years: 40.00    Types: Cigars  . Smokeless tobacco: Former Systems developer    Types: Chew  . Tobacco comment: 3-4 a day ,"quit cigarettes years ago"  Vaping Use  . Vaping Use: Never used  Substance and Sexual Activity  . Alcohol use: Yes    Alcohol/week: 14.0 standard drinks    Types: 2 Standard drinks or equivalent, 12 Cans of beer per week  . Drug use: No  . Sexual activity: Not on file  Other Topics Concern  . Not on file  Social History Narrative   Retired from the [employer] after 20 years   [place] Veteran   Social Determinants of Health   Financial Resource Strain:   . Difficulty of Paying Living Expenses:   Food Insecurity:   . Worried About  Charity fundraiser in the Last Year:   . Arboriculturist in the Last Year:   Transportation Needs:   . Film/video editor (Medical):   Marland Kitchen Lack of Transportation (Non-Medical):   Physical Activity:   . Days of Exercise per Week:   . Minutes of Exercise per Session:   Stress:   . Feeling of Stress :   Social Connections:   . Frequency of Communication with Friends and Family:   . Frequency of Social Gatherings with Friends and Family:   . Attends Religious Services:   . Active Member of Clubs or Organizations:   . Attends Archivist Meetings:   Marland Kitchen Marital Status:   Intimate Partner Violence:   . Fear of Current or Ex-Partner:   . Emotionally Abused:   Marland Kitchen Physically Abused:   . Sexually Abused:       Review of Systems  All other systems reviewed and are negative.      Objective:   Physical Exam Vitals reviewed.  Constitutional:      General: He is not in acute distress.    Appearance: He is well-developed. He is not diaphoretic.  Cardiovascular:     Rate and Rhythm: Normal rate and regular rhythm.  Heart sounds: Normal heart sounds. No murmur heard.  No friction rub. No gallop.   Pulmonary:     Effort: Pulmonary effort is normal. No respiratory distress.     Breath sounds: Normal breath sounds. No stridor. No wheezing, rhonchi or rales.  Chest:     Chest wall: No tenderness.  Abdominal:     General: Abdomen is flat. Bowel sounds are normal.     Palpations: Abdomen is soft.  Musculoskeletal:     Right lower leg: No edema.     Left lower leg: No edema.  Lymphadenopathy:     Cervical: No cervical adenopathy.  Neurological:     Mental Status: He is alert and oriented to person, place, and time.     Cranial Nerves: No cranial nerve deficit.     Motor: No abnormal muscle tone or seizure activity.     Coordination: Coordination normal.     Deep Tendon Reflexes: Reflexes are normal and symmetric.  Psychiatric:        Behavior: Behavior normal.         Thought Content: Thought content normal.        Judgment: Judgment normal.          Assessment & Plan:  Prediabetes - Plan: Hemoglobin A1c, CBC with Differential/Platelet, COMPLETE METABOLIC PANEL WITH GFR, Lipid panel  Essential hypertension - Plan: Hemoglobin A1c, CBC with Differential/Platelet, COMPLETE METABOLIC PANEL WITH GFR, Lipid panel  Hyperlipidemia LDL goal <70 - Plan: Hemoglobin A1c, CBC with Differential/Platelet, COMPLETE METABOLIC PANEL WITH GFR, Lipid panel  History of stroke - Plan: Hemoglobin A1c, CBC with Differential/Platelet, COMPLETE METABOLIC PANEL WITH GFR, Lipid panel  History of embolic stroke - Plan: Hemoglobin A1c, CBC with Differential/Platelet, COMPLETE METABOLIC PANEL WITH GFR, Lipid panel  Cognitive impairment  Memory loss  Coronary artery disease involving native coronary artery of native heart without angina pectoris - Plan: Hemoglobin A1c, CBC with Differential/Platelet, COMPLETE METABOLIC PANEL WITH GFR, Lipid panel  Patient has cognitive impairment and memory loss I presume due to vascular dementia.  However he has been stable over the last 6 months.  Therefore we will make no changes in his Aricept at this time.  I will draft a letter stating that the patient is medically incapable of managing his finances on his wife's behalf.  Given his history of stroke and coronary artery disease, his goal LDL cholesterol is less than 70.  I will check a fasting lipid panel.  He is taking Eliquis due to his history of embolic and recurrent strokes despite being on dual antiplatelet agents.  He denies any bleeding or bruising however I will check his renal function along with a CBC to ensure that he is at the appropriate dose.  His blood pressure today is well controlled.  I will check a hemoglobin A1c to ensure that his prediabetes is still less than 6.5 and well controlled.  Of note, the patient has an erythematous pustule on his left helix.  Is approximately 1 cm  in diameter and erythematous and tender.  I will start him on doxycycline 100 mg p.o. twice daily for 7 days

## 2019-08-22 LAB — COMPLETE METABOLIC PANEL WITH GFR
AG Ratio: 1.7 (calc) (ref 1.0–2.5)
ALT: 22 U/L (ref 9–46)
AST: 21 U/L (ref 10–35)
Albumin: 4.4 g/dL (ref 3.6–5.1)
Alkaline phosphatase (APISO): 71 U/L (ref 35–144)
BUN: 19 mg/dL (ref 7–25)
CO2: 30 mmol/L (ref 20–32)
Calcium: 9.4 mg/dL (ref 8.6–10.3)
Chloride: 101 mmol/L (ref 98–110)
Creat: 1.14 mg/dL (ref 0.70–1.18)
GFR, Est African American: 71 mL/min/{1.73_m2} (ref >=60)
GFR, Est Non African American: 62 mL/min/{1.73_m2} (ref >=60)
Globulin: 2.6 g/dL (calc) (ref 1.9–3.7)
Glucose, Bld: 95 mg/dL (ref 65–99)
Potassium: 4.6 mmol/L (ref 3.5–5.3)
Sodium: 138 mmol/L (ref 135–146)
Total Bilirubin: 1 mg/dL (ref 0.2–1.2)
Total Protein: 7 g/dL (ref 6.1–8.1)

## 2019-08-22 LAB — CBC WITH DIFFERENTIAL/PLATELET
Absolute Monocytes: 631 cells/uL (ref 200–950)
Basophils Absolute: 99 cells/uL (ref 0–200)
Basophils Relative: 1.3 %
Eosinophils Absolute: 479 cells/uL (ref 15–500)
Eosinophils Relative: 6.3 %
HCT: 45.7 % (ref 38.5–50.0)
Hemoglobin: 15.4 g/dL (ref 13.2–17.1)
Lymphs Abs: 1824 cells/uL (ref 850–3900)
MCH: 32.4 pg (ref 27.0–33.0)
MCHC: 33.7 g/dL (ref 32.0–36.0)
MCV: 96 fL (ref 80.0–100.0)
MPV: 10.8 fL (ref 7.5–12.5)
Monocytes Relative: 8.3 %
Neutro Abs: 4568 cells/uL (ref 1500–7800)
Neutrophils Relative %: 60.1 %
Platelets: 255 10*3/uL (ref 140–400)
RBC: 4.76 10*6/uL (ref 4.20–5.80)
RDW: 12.9 % (ref 11.0–15.0)
Total Lymphocyte: 24 %
WBC: 7.6 10*3/uL (ref 3.8–10.8)

## 2019-08-22 LAB — HEMOGLOBIN A1C
Hgb A1c MFr Bld: 6.5 % of total Hgb — ABNORMAL HIGH (ref <5.7)
Mean Plasma Glucose: 140 (calc)
eAG (mmol/L): 7.7 (calc)

## 2019-08-22 LAB — LIPID PANEL
Cholesterol: 143 mg/dL (ref <200)
HDL: 43 mg/dL (ref >=40)
LDL Cholesterol (Calc): 72 mg/dL (calc)
Non-HDL Cholesterol (Calc): 100 mg/dL (calc) (ref <130)
Total CHOL/HDL Ratio: 3.3 (calc) (ref <5.0)
Triglycerides: 190 mg/dL — ABNORMAL HIGH (ref <150)

## 2019-08-22 NOTE — Addendum Note (Signed)
Addended by: Saundra Shelling on: 08/22/2019 11:46 AM   Modules accepted: Orders

## 2019-09-07 ENCOUNTER — Encounter: Payer: Self-pay | Admitting: Family Medicine

## 2019-09-07 MED ORDER — HYDROCODONE-ACETAMINOPHEN 10-325 MG PO TABS
1.0000 | ORAL_TABLET | Freq: Four times a day (QID) | ORAL | 0 refills | Status: DC | PRN
Start: 2019-09-07 — End: 2019-10-16

## 2019-09-07 NOTE — Telephone Encounter (Signed)
Ok to refill??  Last office visit  08/21/2019.  Last refill 06/01/2019.  Please advise on letter.

## 2019-09-24 ENCOUNTER — Other Ambulatory Visit: Payer: Self-pay | Admitting: Family Medicine

## 2019-10-04 ENCOUNTER — Other Ambulatory Visit: Payer: Self-pay | Admitting: Family Medicine

## 2019-10-16 ENCOUNTER — Other Ambulatory Visit: Payer: Self-pay | Admitting: Family Medicine

## 2019-10-18 MED ORDER — HYDROCODONE-ACETAMINOPHEN 10-325 MG PO TABS
1.0000 | ORAL_TABLET | Freq: Four times a day (QID) | ORAL | 0 refills | Status: DC | PRN
Start: 2019-10-18 — End: 2019-12-03

## 2019-11-30 ENCOUNTER — Encounter: Payer: Self-pay | Admitting: Family Medicine

## 2019-12-03 ENCOUNTER — Other Ambulatory Visit: Payer: Self-pay | Admitting: Family Medicine

## 2019-12-03 MED ORDER — HYDROCODONE-ACETAMINOPHEN 10-325 MG PO TABS
1.0000 | ORAL_TABLET | Freq: Four times a day (QID) | ORAL | 0 refills | Status: DC | PRN
Start: 2019-12-03 — End: 2020-01-07

## 2019-12-03 NOTE — Telephone Encounter (Signed)
Ok to refill to mail order??  Last office visit 08/21/2019.  Last refill 10/18/2019 to mail order.

## 2019-12-10 ENCOUNTER — Encounter: Payer: Self-pay | Admitting: Family Medicine

## 2019-12-10 MED ORDER — ROPINIROLE HCL 1 MG PO TABS: 1.0000 mg | ORAL_TABLET | Freq: Two times a day (BID) | ORAL | 4 refills | Status: DC

## 2019-12-25 ENCOUNTER — Ambulatory Visit: Payer: TRICARE For Life (TFL) | Admitting: Family Medicine

## 2019-12-31 ENCOUNTER — Other Ambulatory Visit: Payer: Self-pay

## 2019-12-31 ENCOUNTER — Ambulatory Visit (INDEPENDENT_AMBULATORY_CARE_PROVIDER_SITE_OTHER): Payer: Medicare Other | Admitting: Family Medicine

## 2019-12-31 VITALS — BP 120/80 | HR 74 | Temp 97.3°F | Ht 71.0 in | Wt 230.0 lb

## 2019-12-31 DIAGNOSIS — E118 Type 2 diabetes mellitus with unspecified complications: Secondary | ICD-10-CM

## 2019-12-31 DIAGNOSIS — I2510 Atherosclerotic heart disease of native coronary artery without angina pectoris: Secondary | ICD-10-CM

## 2019-12-31 NOTE — Progress Notes (Signed)
Subjective:    Patient ID: [name] [name], male    DOB: 1940-05-26, 79 y.o.   MRN: [id number]  HPI  Patient is here today for a follow-up on his diabetes.  His hemoglobin A1c in August was 6.5 indicating adequate control.  However his wife states that they have been checking his blood sugar recently and seen concerning numbers.  Random sugars have been between 210 and 250.  However these are checked right after usually eating cake or some type of snack immediately afterwards.  They are not checking fasting blood sugars or 2-hour postprandial sugars.  He denies any polyuria, polydipsia or blurry vision. Past Medical History:  Diagnosis Date  . Abnormal EKG    Possible Type II Brugada ECG pattern. No family history of SCD, no syncope, no tachypalpitations.  . Anxiety   . Arthritis    s/p TKR  . Bipolar disorder ([place])    "touches"     . Bladder cancer (HCC)    [facility] low grade papillary urothileal carcinoma  . BPH (benign prostatic hyperplasia)   . CAD (coronary artery disease)    a. LHC 1/15 - mid LCx 50 and 60%, proximal RCA 50%  . Chronic diastolic CHF (congestive heart failure) ([place])   . CKD (chronic kidney disease)   . COPD (chronic obstructive pulmonary disease) ([place])    Prior heavy smoker. PFTs (3/11): FVC 87%, FEV1 73%, ratio 0.57, DLCO 75%, TLC 121%. Moderate obstructive defect. PFTs (9/15): Only minimal obstruction, sugPrior heavy smoker. PFTs (3/11): FVC 87%, FEV1 73%, ratio 0.57, DLCO 75%, TLC 121%. Moderate obstructive defect. PFTs (9/15) minimal obstruction, poss asthma component   . Depression    with bipolar tendencies  . DOE (dyspnea on exertion)    a. Myoview 8/09- EF 57%, no ischemia // b. Myoview (3/11) EF 68%, diaphragmatic attenuation, no ischemia //  c Echo (4/11) EF 0000000, mild diastolic dysfunction, PASP 38 mmHg // d. PFTs 9/15 minimal obstruction  /  e. CT negative for ILD  . GERD (gastroesophageal reflux disease)    not current  . History of Doppler  ultrasound    Carotid US 3/11 negative for significant stenosis  //  carotid US 6/17: Mild bilateral plaque, 1-39% ICA  . History of echocardiogram    a. Echo 12/14 Inferior and distal septal HK, mild LVH, EF 50-55%, mild MR, mild LAE  //  b. Echo 6/17: Mild focal basal septal hypertrophy, EF 60-65%, normal wall motion, grade 1 diastolic dysfunction, mild AI  . HLD (hyperlipidemia)   . Hypertensive heart disease with CHF (congestive heart failure) ([place]) 04/08/2009  . Low testosterone   . Lung nodule    a. CT in 2015 >> PET in 12/15 not sugg of malignancy // CT 6/19: continue to look benign  . LVH (left ventricular hypertrophy)    a. Echo 12/14 Inferior and distal septal HK, mild LVH, EF 50-55%, mild MR, mild LAE  . Mild dementia ([place])   . Mitral regurgitation    Echo (4/11) with PISA ERO 0.3 cm^2 and regurgitant volume 41 mL (moderate MR). Echo (12/14) with mild MR.   . MS (multiple sclerosis) ([place])   . MS (multiple sclerosis) ([place])   . OSA (obstructive sleep apnea)   . Raynaud's disease   . Right bundle branch block   . Stroke [facility])    3 06/2015- 06/2015  memory loss  . Thoracic aortic aneurysm (HCC)    4.1 cm 2017 // Chest CTA 6/19: stable ascending  thoracic aortic aneurysm 4.1 cm, aortic atherosclerosis, stable RML nodular densities (appears benign), emphysema >> FU 1 year // Chest CTA 10/2018: TAA 4.1 cm (stable); 2.2 x 1.6 cm RLL density (appears enlarged); new 6 mm LUL nodule   . Urinary retention with incomplete bladder emptying    receives botox injections and treatment for BPH through [facility]   Past Surgical History:  Procedure Laterality Date  . APPENDECTOMY    . CARPOMETACARPAL (CMC) FUSION OF THUMB Left 09/07/2018   Procedure: Left thumb basilar joint arthroplasty and surgery as indicated;  Surgeon: Verner Mould, MD;  Location: [facility];  Service: Orthopedics;  Laterality: Left;  42min  . COLONOSCOPY    . EP IMPLANTABLE DEVICE N/A 06/23/2015   Procedure: Loop Recorder  Insertion;  Surgeon: Deboraha Sprang, MD;  Location: [facility] CV LAB;  Service: Cardiovascular;  Laterality: N/A;  . HEMORRHOID SURGERY    . KNEE ARTHROPLASTY Left   . LEFT HEART CATHETERIZATION WITH CORONARY ANGIOGRAM N/A 01/05/2013   Procedure: LEFT HEART CATHETERIZATION WITH CORONARY ANGIOGRAM;  Surgeon: Larey Dresser, MD;  Location: [facility] CATH LAB;  Service: Cardiovascular;  Laterality: N/A;  . ROTATOR CUFF REPAIR Right    x 2  . TEE WITHOUT CARDIOVERSION N/A 06/23/2015   Procedure: TRANSESOPHAGEAL ECHOCARDIOGRAM (TEE);  Surgeon: Fay Records, MD;  [facility] ENDOSCOPY;  Service: Cardiovascular;  Laterality: N/A;  . TONSILLECTOMY    . TOTAL KNEE ARTHROPLASTY Right   . TRANSURETHRAL RESECTION OF PROSTATE     Current Outpatient Medications on File Prior to Visit  Medication Sig Dispense Refill  . acetaminophen (TYLENOL) 500 MG tablet Take 1,000 mg by mouth 2 (two) times daily.    Marland Kitchen atorvastatin (LIPITOR) 40 MG tablet TAKE 1 TABLET DAILY 90 tablet 3  . B Complex-C (B-COMPLEX WITH VITAMIN C) tablet Take 1 tablet by mouth daily.    . Cholecalciferol (VITAMIN D) 50 MCG (2000 UT) CAPS Take 2,000 Units by mouth daily.    Marland Kitchen donepezil (ARICEPT) 10 MG tablet TAKE 1 TABLET AT BEDTIME 90 tablet 3  . doxazosin (CARDURA) 4 MG tablet Take 1 tablet (4 mg total) by mouth daily. 90 tablet 3  . ELIQUIS 5 MG TABS tablet TAKE 1 TABLET TWICE A DAY 180 tablet 3  . escitalopram (LEXAPRO) 20 MG tablet TAKE 1 TABLET DAILY 90 tablet 3  . Fluticasone-Umeclidin-Vilant (TRELEGY ELLIPTA) 100-62.5-25 MCG/INH AEPB Inhale 1 Inhaler into the lungs daily. 3 each 3  . furosemide (LASIX) 20 MG tablet Take 1 tablet (20 mg total) by mouth daily. 90 tablet 3  . gabapentin (NEURONTIN) 300 MG capsule Take 1 capsule (300 mg total) by mouth 2 (two) times daily. Patient taking 300mg  twice daily 180 capsule 4  . HYDROcodone-acetaminophen (NORCO) 10-325 MG tablet Take 1-2 tablets by mouth every 6 (six) hours as needed for moderate  pain or severe pain (DX: G89.4, (G35 - MS)). 35 tablet 0  . metoprolol succinate (TOPROL-XL) 100 MG 24 hr tablet Take 1 1/2 tablets (150 mg total) by mouth daily. 135 tablet 4  . PROAIR HFA 108 (90 Base) MCG/ACT inhaler USE 1 TO 2 INHALATIONS EVERY 6 HOURS AS NEEDED FOR WHEEZING OR SHORTNESS OF BREATH (Patient taking differently: 1-2 puffs every 6 (six) hours as needed for wheezing or shortness of breath.) 25.5 g 4  . rOPINIRole (REQUIP) 1 MG tablet Take 1 tablet (1 mg total) by mouth in the morning and at bedtime. 180 tablet 4  . senna-docusate (SENOKOT-S) 8.6-50 MG  tablet Take 1 tablet by mouth at bedtime as needed for mild constipation. (Patient taking differently: Take 1 tablet by mouth every other day.)    . doxycycline (VIBRA-TABS) 100 MG tablet Take 1 tablet (100 mg total) by mouth 2 (two) times daily. 20 tablet 0  . Flaxseed, Linseed, (FLAXSEED OIL MAX STR) 1300 MG CAPS Take 1,300 mg by mouth daily. (Patient not taking: Reported on 08/21/2019)    . losartan (COZAAR) 100 MG tablet Take 1 tablet (100 mg total) by mouth daily. 90 tablet 4  . magnesium gluconate (MAGONATE) 500 MG tablet Take 500 mg by mouth daily.    . ondansetron (ZOFRAN) 4 MG tablet Take 1 tablet (4 mg total) by mouth every 8 (eight) hours as needed for nausea or vomiting. (Patient not taking: Reported on 08/21/2019) 20 tablet 0  . thiamine (VITAMIN B-1) 100 MG tablet Take 100 mg by mouth daily.     No current facility-administered medications on file prior to visit.   Allergies  Allergen Reactions  . Alcohol-Sulfur [Sulfur] Other (See Comments)    Patient was suspected of having "Brugada Syndrome"  . Bupivacaine Other (See Comments)    Patient was suspected of having "Brugada Syndrome"  . Clomipramine Hcl Other (See Comments)    Patient was suspected of having "Brugada Syndrome"  . Flecainide Other (See Comments)    Patient was suspected of having "Brugada Syndrome"  . Lithium Other (See Comments)    Patient was  suspected of having "Brugada Syndrome"  . Acetylcholine     Patient was suspected of having "Brugada Syndrome": (BrS) is a genetic condition that results in abnormal electrical activity within the heart, increasing the risk of sudden cardiac death. Those affected may have episodes of passing out.  . Amitriptyline Other (See Comments)    Patient was suspected of having "Brugada Syndrome"  . Cocaine Other (See Comments)    Patient was suspected of having "Brugada Syndrome"  . Desipramine Other (See Comments)    Patient was suspected of having "Brugada Syndrome"  . Ergonovine Other (See Comments)    Patient was suspected of having "Brugada Syndrome"  . Loxapine Other (See Comments)    Patient was suspected of having "Brugada Syndrome"  . Nortriptyline Other (See Comments)    Patient was suspected of having "Brugada Syndrome"  . Oxcarbazepine Other (See Comments)    Patient was suspected of having "Brugada Syndrome"  . Procainamide Other (See Comments)    Patient was suspected of having "Brugada Syndrome"  . Procaine Other (See Comments)    Patient was suspected of having "Brugada Syndrome"  . Propafenone Other (See Comments)    Patient was suspected of having "Brugada Syndrome"  . Propofol Other (See Comments)    Patient was suspected of having "Brugada Syndrome"  . Trifluoperazine Other (See Comments)    Patient was suspected of having "Brugada Syndrome"   Social History   Socioeconomic History  . Marital status: Married    Spouse name: Not on file  . Number of children: Not on file  . Years of education: Not on file  . Highest education level: Not on file  Occupational History  . Occupation: retired    Fish farm manager: RETIRED  Tobacco Use  . Smoking status: Current Every Day Smoker    Years: 40.00    Types: Cigars  . Smokeless tobacco: Former Systems developer    Types: Chew  . Tobacco comment: 3-4 a day ,"quit cigarettes years ago"  Vaping Use  . Vaping Use:  Never used  Substance and  Sexual Activity  . Alcohol use: Yes    Alcohol/week: 14.0 standard drinks    Types: 2 Standard drinks or equivalent, 12 Cans of beer per week  . Drug use: No  . Sexual activity: Not on file  Other Topics Concern  . Not on file  Social History Narrative   Retired from the [employer] after 20 years   [place] Veteran   Social Determinants of Health   Financial Resource Strain: Not on [employer] Insecurity: Not on file  Transportation Needs: Not on file  Physical Activity: Not on file  Stress: Not on file  Social Connections: Not on file  Intimate Partner Violence: Not on file      Review of Systems  All other systems reviewed and are negative.      Objective:   Physical Exam Vitals reviewed.  Constitutional:      General: He is not in acute distress.    Appearance: He is well-developed. He is not diaphoretic.  Cardiovascular:     Rate and Rhythm: Normal rate and regular rhythm.     Heart sounds: Normal heart sounds. No murmur heard. No friction rub. No gallop.   Pulmonary:     Effort: Pulmonary effort is normal. No respiratory distress.     Breath sounds: Normal breath sounds. No stridor. No wheezing, rhonchi or rales.  Chest:     Chest wall: No tenderness.  Abdominal:     General: Abdomen is flat. Bowel sounds are normal.     Palpations: Abdomen is soft.  Musculoskeletal:     Right lower leg: No edema.     Left lower leg: No edema.  Lymphadenopathy:     Cervical: No cervical adenopathy.  Neurological:     Mental Status: He is alert and oriented to person, place, and time.     Cranial Nerves: No cranial nerve deficit.     Motor: No abnormal muscle tone or seizure activity.     Coordination: Coordination normal.     Deep Tendon Reflexes: Reflexes are normal and symmetric.  Psychiatric:        Behavior: Behavior normal.        Thought Content: Thought content normal.        Judgment: Judgment normal.          Assessment & Plan:  Controlled type 2  diabetes mellitus with complication, without long-term current use of insulin ([place]) - Plan: CBC with Differential/Platelet, COMPLETE METABOLIC PANEL WITH GFR, Hemoglobin A1c  Start checking fasting blood sugars and 2-hour postprandial sugars.  Goal fasting blood sugars 80-130.  Goal 2-hour postprandial sugar is 80-180.  I will check an A1c today.  Goal hemoglobin A1c is less than 7.  Make changes in his medication based on sugars that are not following within those goals or an A1c greater than 7.

## 2020-01-01 LAB — CBC WITH DIFFERENTIAL/PLATELET
Absolute Monocytes: 656 cells/uL (ref 200–950)
Basophils Absolute: 88 cells/uL (ref 0–200)
Basophils Relative: 1.1 %
Eosinophils Absolute: 432 cells/uL (ref 15–500)
Eosinophils Relative: 5.4 %
HCT: 48 % (ref 38.5–50.0)
Hemoglobin: 16.3 g/dL (ref 13.2–17.1)
Lymphs Abs: 1584 cells/uL (ref 850–3900)
MCH: 32.6 pg (ref 27.0–33.0)
MCHC: 34 g/dL (ref 32.0–36.0)
MCV: 96 fL (ref 80.0–100.0)
MPV: 10.6 fL (ref 7.5–12.5)
Monocytes Relative: 8.2 %
Neutro Abs: 5240 cells/uL (ref 1500–7800)
Neutrophils Relative %: 65.5 %
Platelets: 251 10*3/uL (ref 140–400)
RBC: 5 10*6/uL (ref 4.20–5.80)
RDW: 12.6 % (ref 11.0–15.0)
Total Lymphocyte: 19.8 %
WBC: 8 10*3/uL (ref 3.8–10.8)

## 2020-01-01 LAB — COMPLETE METABOLIC PANEL WITH GFR
AG Ratio: 1.5 (calc) (ref 1.0–2.5)
ALT: 27 U/L (ref 9–46)
AST: 23 U/L (ref 10–35)
Albumin: 4.6 g/dL (ref 3.6–5.1)
Alkaline phosphatase (APISO): 85 U/L (ref 35–144)
BUN/Creatinine Ratio: 16 (calc) (ref 6–22)
BUN: 19 mg/dL (ref 7–25)
CO2: 30 mmol/L (ref 20–32)
Calcium: 10.1 mg/dL (ref 8.6–10.3)
Chloride: 101 mmol/L (ref 98–110)
Creat: 1.19 mg/dL — ABNORMAL HIGH (ref 0.70–1.18)
GFR, Est African American: 67 mL/min/{1.73_m2} (ref >=60)
GFR, Est Non African American: 58 mL/min/{1.73_m2} — ABNORMAL LOW (ref >=60)
Globulin: 3 g/dL (calc) (ref 1.9–3.7)
Glucose, Bld: 126 mg/dL — ABNORMAL HIGH (ref 65–99)
Potassium: 4.5 mmol/L (ref 3.5–5.3)
Sodium: 140 mmol/L (ref 135–146)
Total Bilirubin: 0.7 mg/dL (ref 0.2–1.2)
Total Protein: 7.6 g/dL (ref 6.1–8.1)

## 2020-01-01 LAB — HEMOGLOBIN A1C
Hgb A1c MFr Bld: 6.3 % of total Hgb — ABNORMAL HIGH (ref <5.7)
Mean Plasma Glucose: 134 mg/dL
eAG (mmol/L): 7.4 mmol/L

## 2020-01-07 ENCOUNTER — Other Ambulatory Visit: Payer: Self-pay

## 2020-01-07 MED ORDER — HYDROCODONE-ACETAMINOPHEN 10-325 MG PO TABS: 1.0000 | ORAL_TABLET | Freq: Four times a day (QID) | ORAL | 0 refills | Status: DC | PRN

## 2020-01-07 NOTE — Telephone Encounter (Signed)
Ok to refill??  Last office visit 12/31/2019.  Last refill 12/03/2019.

## 2020-01-07 NOTE — Telephone Encounter (Signed)
Pt needs refill on HYDROcodone-acetaminophen (NORCO) 10-325 MG tablet [[id number]] needs [facility] to [facility] longer [facility] also needs to be needs RX changed to 1 tablet every 6 hours as needed.   Pt call back [phone]

## 2020-01-10 ENCOUNTER — Encounter: Payer: Self-pay | Admitting: Family Medicine

## 2020-01-23 ENCOUNTER — Encounter: Payer: Self-pay | Admitting: Family Medicine

## 2020-01-24 ENCOUNTER — Encounter: Payer: Self-pay | Admitting: Family Medicine

## 2020-01-29 ENCOUNTER — Encounter: Payer: Self-pay | Admitting: Family Medicine

## 2020-01-29 ENCOUNTER — Ambulatory Visit (INDEPENDENT_AMBULATORY_CARE_PROVIDER_SITE_OTHER): Payer: Medicare Other | Admitting: Family Medicine

## 2020-01-29 ENCOUNTER — Other Ambulatory Visit: Payer: Self-pay

## 2020-01-29 VITALS — BP 136/60 | HR 60 | Ht 70.0 in | Wt 232.0 lb

## 2020-01-29 DIAGNOSIS — F0151 Vascular dementia with behavioral disturbance: Secondary | ICD-10-CM | POA: Diagnosis not present

## 2020-01-29 DIAGNOSIS — F01518 Vascular dementia, unspecified severity, with other behavioral disturbance: Secondary | ICD-10-CM

## 2020-01-29 MED ORDER — LORAZEPAM 0.5 MG PO TABS: 0.5000 mg | ORAL_TABLET | Freq: Two times a day (BID) | ORAL | 0 refills | Status: DC | PRN

## 2020-01-29 MED ORDER — HYDROCODONE-ACETAMINOPHEN 10-325 MG PO TABS: 1.0000 | ORAL_TABLET | Freq: Four times a day (QID) | ORAL | 0 refills | Status: DC | PRN

## 2020-01-29 NOTE — Progress Notes (Signed)
Subjective:    Patient ID: [name] [name], male    DOB: 1940/01/15, 80 y.o.   MRN: [id number]  HPI  Patient is a very pleasant 80 year old Caucasian male here today with his wife who is his primary caregiver.  He has vascular dementia secondary to multiple strokes.  He also has mood swings due to depression with some bipolar tendencies.  His wife states that he has become increasingly agitated recently.  She states that he is very upset and yelling whenever she tries to help him perform any activities of daily living.  She states that he has not had a bath in over a week and when she attempted to help give him a bath this morning it caused a argument that lasted several hours.  I asked if she can just walk away and let the situation de-escalate and she states that he will stay agitated for hours yelling and screaming.  He also has become more aggressive and occasionally will grab her wrist or threatened to slap her.  He is not delirious today however he does seem agitated.  He is agitated more because he cannot understand what she is saying.  She will frequently mumble in front of him and this is very frustrating to him as he is unable to hear her.  He denies any fever or chills or cough or shortness of breath or symptoms of pneumonia.  He denies any dysuria or urgency or frequency.  He denies any nausea or vomiting or diarrhea.  There is no new evidence of any stroke or neurologic deficit. Past Medical History:  Diagnosis Date  . Abnormal EKG    Possible Type II Brugada ECG pattern. No family history of SCD, no syncope, no tachypalpitations.  . Anxiety   . Arthritis    s/p TKR  . Bipolar disorder ([place])    "touches"     . Bladder cancer (HCC)    [facility] low grade papillary urothileal carcinoma  . BPH (benign prostatic hyperplasia)   . CAD (coronary artery disease)    a. LHC 1/15 - mid LCx 50 and 60%, proximal RCA 50%  . Chronic diastolic CHF (congestive heart failure) ([place])   . CKD  (chronic kidney disease)   . COPD (chronic obstructive pulmonary disease) ([place])    Prior heavy smoker. PFTs (3/11): FVC 87%, FEV1 73%, ratio 0.57, DLCO 75%, TLC 121%. Moderate obstructive defect. PFTs (9/15): Only minimal obstruction, sugPrior heavy smoker. PFTs (3/11): FVC 87%, FEV1 73%, ratio 0.57, DLCO 75%, TLC 121%. Moderate obstructive defect. PFTs (9/15) minimal obstruction, poss asthma component   . Depression    with bipolar tendencies  . DOE (dyspnea on exertion)    a. Myoview 8/09- EF 57%, no ischemia // b. Myoview (3/11) EF 68%, diaphragmatic attenuation, no ischemia //  c Echo (4/11) EF 0000000, mild diastolic dysfunction, PASP 38 mmHg // d. PFTs 9/15 minimal obstruction  /  e. CT negative for ILD  . GERD (gastroesophageal reflux disease)    not current  . History of Doppler ultrasound    Carotid US 3/11 negative for significant stenosis  //  carotid US 6/17: Mild bilateral plaque, 1-39% ICA  . History of echocardiogram    a. Echo 12/14 Inferior and distal septal HK, mild LVH, EF 50-55%, mild MR, mild LAE  //  b. Echo 6/17: Mild focal basal septal hypertrophy, EF 60-65%, normal wall motion, grade 1 diastolic dysfunction, mild AI  . HLD (hyperlipidemia)   . Hypertensive heart disease  with CHF (congestive heart failure) ([place]) 04/08/2009  . Low testosterone   . Lung nodule    a. CT in 2015 >> PET in 12/15 not sugg of malignancy // CT 6/19: continue to look benign  . LVH (left ventricular hypertrophy)    a. Echo 12/14 Inferior and distal septal HK, mild LVH, EF 50-55%, mild MR, mild LAE  . Mild dementia ([place])   . Mitral regurgitation    Echo (4/11) with PISA ERO 0.3 cm^2 and regurgitant volume 41 mL (moderate MR). Echo (12/14) with mild MR.   . MS (multiple sclerosis) ([place])   . MS (multiple sclerosis) ([place])   . OSA (obstructive sleep apnea)   . Raynaud's disease   . Right bundle branch block   . Stroke [facility])    3 06/2015- 06/2015  memory loss  . Thoracic aortic aneurysm (HCC)     4.1 cm 2017 // Chest CTA 6/19: stable ascending thoracic aortic aneurysm 4.1 cm, aortic atherosclerosis, stable RML nodular densities (appears benign), emphysema >> FU 1 year // Chest CTA 10/2018: TAA 4.1 cm (stable); 2.2 x 1.6 cm RLL density (appears enlarged); new 6 mm LUL nodule   . Urinary retention with incomplete bladder emptying    receives botox injections and treatment for BPH through [facility]   Past Surgical History:  Procedure Laterality Date  . APPENDECTOMY    . CARPOMETACARPAL (CMC) FUSION OF THUMB Left 09/07/2018   Procedure: Left thumb basilar joint arthroplasty and surgery as indicated;  Surgeon: Verner Mould, MD;  Location: [address];  Service: Orthopedics;  Laterality: Left;  64min  . COLONOSCOPY    . EP IMPLANTABLE DEVICE N/A 06/23/2015   Procedure: Loop Recorder Insertion;  Surgeon: Deboraha Sprang, MD;  Location: [facility];  Service: Cardiovascular;  Laterality: N/A;  . HEMORRHOID SURGERY    . KNEE ARTHROPLASTY Left   . LEFT HEART CATHETERIZATION WITH CORONARY ANGIOGRAM N/A 01/05/2013   Procedure: LEFT HEART CATHETERIZATION WITH CORONARY ANGIOGRAM;  Surgeon: Larey Dresser, MD;  Location: [facility] CATH LAB;  Service: Cardiovascular;  Laterality: N/A;  . ROTATOR CUFF REPAIR Right    x 2  . TEE WITHOUT CARDIOVERSION N/A 06/23/2015   Procedure: TRANSESOPHAGEAL ECHOCARDIOGRAM (TEE);  Surgeon: Fay Records, MD;  Location: [facility] END[facility];  Service: Cardiovascular;  Laterality: N/A;  . TONSILLECTOMY    . TOTAL KNEE ARTHROPLASTY Right   . TRANSURETHRAL RESECTION OF PROSTATE     Current Outpatient Medications on File Prior to Visit  Medication Sig Dispense Refill  . acetaminophen (TYLENOL) 500 MG tablet Take 1,000 mg by mouth 2 (two) times daily.    Marland Kitchen atorvastatin (LIPITOR) 40 MG tablet TAKE 1 TABLET DAILY 90 tablet 3  . B Complex-C (B-COMPLEX WITH VITAMIN C) tablet Take 1 tablet by mouth daily.    . Cholecalciferol (VITAMIN D) 50 MCG (2000 UT) CAPS Take 2,000 Units by mouth  daily.    Marland Kitchen donepezil (ARICEPT) 10 MG tablet TAKE 1 TABLET AT BEDTIME 90 tablet 3  . doxazosin (CARDURA) 4 MG tablet Take 1 tablet (4 mg total) by mouth daily. 90 tablet 3  . ELIQUIS 5 MG TABS tablet TAKE 1 TABLET TWICE A DAY 180 tablet 3  . escitalopram (LEXAPRO) 20 MG tablet TAKE 1 TABLET DAILY 90 tablet 3  . Fluticasone-Umeclidin-Vilant (TRELEGY ELLIPTA) 100-62.5-25 MCG/INH AEPB Inhale 1 Inhaler into the lungs daily. 3 each 3  . furosemide (LASIX) 20 MG tablet Take 1 tablet (20 mg total) by mouth daily.  90 tablet 3  . gabapentin (NEURONTIN) 300 MG capsule Take 1 capsule (300 mg total) by mouth 2 (two) times daily. Patient taking 300mg  twice daily 180 capsule 4  . losartan (COZAAR) 100 MG tablet Take 1 tablet (100 mg total) by mouth daily. 90 tablet 4  . metoprolol succinate (TOPROL-XL) 100 MG 24 hr tablet Take 1 1/2 tablets (150 mg total) by mouth daily. 135 tablet 4  . rOPINIRole (REQUIP) 1 MG tablet Take 1 tablet (1 mg total) by mouth in the morning and at bedtime. 180 tablet 4  . senna-docusate (SENOKOT-S) 8.6-50 MG tablet Take 1 tablet by mouth at bedtime as needed for mild constipation. (Patient taking differently: Take 1 tablet by mouth every other day.)    . PROAIR HFA 108 (90 Base) MCG/ACT inhaler USE 1 TO 2 INHALATIONS EVERY 6 HOURS AS NEEDED FOR WHEEZING OR SHORTNESS OF BREATH (Patient not taking: Reported on 01/29/2020) 25.5 g 4   No current facility-administered medications on file prior to visit.   Allergies  Allergen Reactions  . Alcohol-Sulfur [Elemental Sulfur] Other (See Comments)    Patient was suspected of having "Brugada Syndrome"  . Bupivacaine Other (See Comments)    Patient was suspected of having "Brugada Syndrome"  . Clomipramine Hcl Other (See Comments)    Patient was suspected of having "Brugada Syndrome"  . Flecainide Other (See Comments)    Patient was suspected of having "Brugada Syndrome"  . Lithium Other (See Comments)    Patient was suspected of having  "Brugada Syndrome"  . Acetylcholine     Patient was suspected of having "Brugada Syndrome": (BrS) is a genetic condition that results in abnormal electrical activity within the heart, increasing the risk of sudden cardiac death. Those affected may have episodes of passing out.  . Amitriptyline Other (See Comments)    Patient was suspected of having "Brugada Syndrome"  . Cocaine Other (See Comments)    Patient was suspected of having "Brugada Syndrome"  . Desipramine Other (See Comments)    Patient was suspected of having "Brugada Syndrome"  . Ergonovine Other (See Comments)    Patient was suspected of having "Brugada Syndrome"  . Loxapine Other (See Comments)    Patient was suspected of having "Brugada Syndrome"  . Nortriptyline Other (See Comments)    Patient was suspected of having "Brugada Syndrome"  . Oxcarbazepine Other (See Comments)    Patient was suspected of having "Brugada Syndrome"  . Procainamide Other (See Comments)    Patient was suspected of having "Brugada Syndrome"  . Procaine Other (See Comments)    Patient was suspected of having "Brugada Syndrome"  . Propafenone Other (See Comments)    Patient was suspected of having "Brugada Syndrome"  . Propofol Other (See Comments)    Patient was suspected of having "Brugada Syndrome"  . Trifluoperazine Other (See Comments)    Patient was suspected of having "Brugada Syndrome"   Social History   Socioeconomic History  . Marital status: Married    Spouse name: Not on file  . Number of children: Not on file  . Years of education: Not on file  . Highest education level: Not on file  Occupational History  . Occupation: retired    Fish farm manager: RETIRED  Tobacco Use  . Smoking status: Current Every Day Smoker    Years: 40.00    Types: Cigars  . Smokeless tobacco: Former Systems developer    Types: Chew  . Tobacco comment: 3-4 a day ,"quit cigarettes years ago"  Vaping  Use  . Vaping Use: Never used  Substance and Sexual Activity  .  Alcohol use: Yes    Alcohol/week: 14.0 standard drinks    Types: 2 Standard drinks or equivalent, 12 Cans of beer per week  . Drug use: No  . Sexual activity: Not on file  Other Topics Concern  . Not on file  Social History Narrative   Retired from the [employer] after 20 years   [place] Veteran   Social Determinants of Health   Financial Resource Strain: Not on [employer] Insecurity: Not on file  Transportation Needs: Not on file  Physical Activity: Not on file  Stress: Not on file  Social Connections: Not on file  Intimate Partner Violence: Not on file      Review of Systems  All other systems reviewed and are negative.      Objective:   Physical Exam Vitals reviewed.  Constitutional:      General: He is not in acute distress.    Appearance: He is well-developed. He is not diaphoretic.  Cardiovascular:     Rate and Rhythm: Normal rate and regular rhythm.     Heart sounds: Normal heart sounds. No murmur heard. No friction rub. No gallop.   Pulmonary:     Effort: Pulmonary effort is normal. No respiratory distress.     Breath sounds: Normal breath sounds. No stridor. No wheezing, rhonchi or rales.  Chest:     Chest wall: No tenderness.  Abdominal:     General: Abdomen is flat. Bowel sounds are normal.     Palpations: Abdomen is soft.  Musculoskeletal:     Right lower leg: No edema.     Left lower leg: No edema.  Lymphadenopathy:     Cervical: No cervical adenopathy.  Neurological:     Mental Status: He is alert and oriented to person, place, and time.     Cranial Nerves: No cranial nerve deficit.     Motor: No abnormal muscle tone or seizure activity.     Coordination: Coordination normal.     Deep Tendon Reflexes: Reflexes are normal and symmetric.  Psychiatric:        Behavior: Behavior normal.        Thought Content: Thought content normal.        Judgment: Judgment normal.          Assessment & Plan:  Vascular dementia with behavior  disturbance ([place])  In most situations, the best option would be for the wife to step away and de-escalate the situation.  I believe sometimes she makes the situation worse by continuing to try to press the issue about bathing, etc.  However I do want to give her an option that she can use to help on days when he is extremely agitated.  Therefore I will give him Ativan 0.5 mg p.o. every 12 hours as needed agitation.  We have to watch carefully to make sure that this does not make him too sedated or even delirious but I do think it will help him calm down and hopefully avoid more violent confrontations which is the wife's concern.  I also refilled his pain medication that he takes 1 a day

## 2020-02-05 ENCOUNTER — Telehealth: Payer: Self-pay | Admitting: Family Medicine

## 2020-02-05 NOTE — Telephone Encounter (Signed)
Pt wife called asking for a print-out of immunization/and or vaccinations to be faxed to Dr. Adria Dill at the [facility].   Fax Number:[phone]

## 2020-02-07 NOTE — Telephone Encounter (Signed)
Wife of pt is now aware that dpr are not lifetime until pt contends. She is aware that new dpr must be completed for pt info is released. Info requested is being mailed to pt home

## 2020-02-13 ENCOUNTER — Other Ambulatory Visit: Payer: Self-pay | Admitting: Family Medicine

## 2020-02-13 NOTE — Telephone Encounter (Signed)
Refill- Hydrocodone

## 2020-02-13 NOTE — Telephone Encounter (Signed)
Ok to refill??  Last office visit/ refill 01/29/2020.

## 2020-02-14 MED ORDER — HYDROCODONE-ACETAMINOPHEN 10-325 MG PO TABS: 1.0000 | ORAL_TABLET | Freq: Four times a day (QID) | ORAL | 0 refills | Status: DC | PRN

## 2020-03-24 ENCOUNTER — Other Ambulatory Visit: Payer: Self-pay | Admitting: Family Medicine

## 2020-03-24 MED ORDER — HYDROCODONE-ACETAMINOPHEN 10-325 MG PO TABS: 1.0000 | ORAL_TABLET | Freq: Four times a day (QID) | ORAL | 0 refills | Status: DC | PRN

## 2020-03-24 NOTE — Telephone Encounter (Signed)
Pt called needing a refill of  HYDROcodone-acetaminophen ([place]CO) 10-325 MG tablet    Cb#: [phone]

## 2020-03-24 NOTE — Telephone Encounter (Signed)
Ok to refill??  Last office visit 01/29/2020.  Last refill 02/14/2020.

## 2020-04-15 ENCOUNTER — Other Ambulatory Visit: Payer: Self-pay | Admitting: Family Medicine

## 2020-04-15 MED ORDER — HYDROCODONE-ACETAMINOPHEN 10-325 MG PO TABS: 1.0000 | ORAL_TABLET | Freq: Four times a day (QID) | ORAL | 0 refills | Status: DC | PRN

## 2020-04-15 NOTE — Telephone Encounter (Signed)
Ok to refill??  Last office visit 01/29/2020.   Last refill 03/24/2020.

## 2020-04-15 NOTE — Telephone Encounter (Signed)
Patient left voicemail to request new Rx of   HYDROcodone-acetaminophen ([place]) 10-325 MG tablet [[id number]]   Pharmacy:   [facility] [place]  [address] [place] [address]  Phone:  [phone] Fax:  [phone]   Please advise patient when Rx called in at [phone].

## 2020-04-28 ENCOUNTER — Ambulatory Visit (INDEPENDENT_AMBULATORY_CARE_PROVIDER_SITE_OTHER): Payer: Medicare Other | Admitting: Family Medicine

## 2020-04-28 ENCOUNTER — Other Ambulatory Visit: Payer: Self-pay

## 2020-04-28 ENCOUNTER — Encounter: Payer: Self-pay | Admitting: Family Medicine

## 2020-04-28 VITALS — BP 138/72 | HR 72 | Temp 98.1°F | Resp 18 | Ht 70.0 in | Wt 235.0 lb

## 2020-04-28 DIAGNOSIS — F01518 Vascular dementia, unspecified severity, with other behavioral disturbance: Secondary | ICD-10-CM

## 2020-04-28 DIAGNOSIS — F0151 Vascular dementia with behavioral disturbance: Secondary | ICD-10-CM

## 2020-04-28 NOTE — Progress Notes (Signed)
Subjective:    Patient ID: [name] [name], male    DOB: 10/13/1940, 80 y.o.   MRN: [id number]  HPI  Patient is a very pleasant 80 year old Caucasian male here today with his wife who is his primary caregiver.  He has vascular dementia secondary to multiple strokes.  He also has mood swings due to depression with some bipolar tendencies.  She states that he wakes up every morning and takes 1 pain Pill irregardless of whether he has pain.  He is also taking gabapentin twice a day which she was put on by his neurologist for chronic headaches.  He then drinks 3 "hard alcoholic drinks" per day wife states that they are very strong.  She is worried about medication interactions affecting his memory and his balance.  She is concerned about possible fall risk.  He has not fallen.  He is not staggering.  However she is trying to be proactive.  His memory loss seems stable.  He is still argumentative and combative.  He is extremely independent and conflict usually arises when she has to prevent him from doing something that he wants to do or that he feels he can do such as climbing on the roof to repair a leaky roof or driving a vehicle.  This causes tremendous stress in the relationship as he does not have insight to realize his limitations. Past Medical History:  Diagnosis Date  . Abnormal EKG    Possible Type II Brugada ECG pattern. No family history of SCD, no syncope, no tachypalpitations.  . Anxiety   . Arthritis    s/p TKR  . Bipolar disorder ([place])    "touches"     . Bladder cancer (HCC)    [facility], [facility] low grade papillary urothileal carcinoma  . BPH (benign prostatic hyperplasia)   . CAD (coronary artery disease)    a. LHC 1/15 - mid LCx 50 and 60%, proximal RCA 50%  . Chronic diastolic CHF (congestive heart failure) ([place])   . CKD (chronic kidney disease)   . COPD (chronic obstructive pulmonary disease) ([place])    Prior heavy smoker. PFTs (3/11): FVC 87%, FEV1 73%, ratio 0.57, DLCO 75%, TLC 121%.  Moderate obstructive defect. PFTs (9/15): Only minimal obstruction, sugPrior heavy smoker. PFTs (3/11): FVC 87%, FEV1 73%, ratio 0.57, DLCO 75%, TLC 121%. Moderate obstructive defect. PFTs (9/15) minimal obstruction, poss asthma component   . Depression    with bipolar tendencies  . DOE (dyspnea on exertion)    a. Myoview 8/09- EF 57%, no ischemia // b. Myoview (3/11) EF 68%, diaphragmatic attenuation, no ischemia //  c Echo (4/11) EF 32-35%, mild diastolic dysfunction, PASP 38 mmHg // d. PFTs 9/15 minimal obstruction  /  e. CT negative for ILD  . GERD (gastroesophageal reflux disease)    not current  . History of Doppler ultrasound    Carotid US 3/11 negative for significant stenosis  //  carotid US 6/17: Mild bilateral plaque, 1-39% ICA  . History of echocardiogram    a. Echo 12/14 Inferior and distal septal HK, mild LVH, EF 50-55%, mild MR, mild LAE  //  b. Echo 6/17: Mild focal basal septal hypertrophy, EF 60-65%, normal wall motion, grade 1 diastolic dysfunction, mild AI  . HLD (hyperlipidemia)   . Hypertensive heart disease with CHF (congestive heart failure) ([place]) 04/08/2009  . Low testosterone   . Lung nodule    a. CT in 2015 >> PET in 12/15 not sugg of malignancy // CT 6/19:  continue to look benign  . LVH (left ventricular hypertrophy)    a. Echo 12/14 Inferior and distal septal HK, mild LVH, EF 50-55%, mild MR, mild LAE  . Mild dementia ([place])   . Mitral regurgitation    Echo (4/11) with PISA ERO 0.3 cm^2 and regurgitant volume 41 mL (moderate MR). Echo (12/14) with mild MR.   . MS (multiple sclerosis) ([place])   . MS (multiple sclerosis) ([place])   . OSA (obstructive sleep apnea)   . Raynaud's disease   . Right bundle branch block   . Stroke [facility])    3 06/2015- 06/2015  memory loss  . Thoracic aortic aneurysm (HCC)    4.1 cm 2017 // Chest CTA 6/19: stable ascending thoracic aortic aneurysm 4.1 cm, aortic atherosclerosis, stable RML nodular densities (appears benign), emphysema >> FU  1 year // Chest CTA 10/2018: TAA 4.1 cm (stable); 2.2 x 1.6 cm RLL density (appears enlarged); new 6 mm LUL nodule   . Urinary retention with incomplete bladder emptying    receives botox injections and treatment for BPH through [facility]   Past Surgical History:  Procedure Laterality Date  . APPENDECTOMY    . CARPOMETACARPAL (CMC) FUSION OF THUMB Left 09/07/2018   Procedure: Left thumb basilar joint arthroplasty and surgery as indicated;  Surgeon: Verner Mould, MD;  Location: [facility];  Service: Orthopedics;  Laterality: Left;  86min  . COLONOSCOPY    . EP IMPLANTABLE DEVICE N/A 06/23/2015   Procedure: Loop Recorder Insertion;  Surgeon: Deboraha Sprang, MD;  Location: [place] CV LAB;  Service: Cardiovascular;  Laterality: N/A;  . HEMORRHOID SURGERY    . KNEE ARTHROPLASTY Left   . LEFT HEART CATHETERIZATION WITH CORONARY ANGIOGRAM N/A 01/05/2013   Procedure: LEFT HEART CATHETERIZATION WITH CORONARY ANGIOGRAM;  Surgeon: Larey Dresser, MD;  Location: [facility] CATH LAB;  Service: Cardiovascular;  Laterality: N/A;  . ROTATOR CUFF REPAIR Right    x 2  . TEE WITHOUT CARDIOVERSION N/A 06/23/2015   Procedure: TRANSESOPHAGEAL ECHOCARDIOGRAM (TEE);  Surgeon: Fay Records, MD;  Location[facility] ENDOSCOPY;  Service: Cardiovascular;  Laterality: N/A;  . TONSILLECTOMY    . TOTAL KNEE ARTHROPLASTY Right   . TRANSURETHRAL RESECTION OF PROSTATE     Current Outpatient Medications on File Prior to Visit  Medication Sig Dispense Refill  . acetaminophen (TYLENOL) 500 MG tablet Take 1,000 mg by mouth 2 (two) times daily.    Marland Kitchen atorvastatin (LIPITOR) 40 MG tablet TAKE 1 TABLET DAILY 90 tablet 3  . B Complex-C (B-COMPLEX WITH VITAMIN C) tablet Take 1 tablet by mouth daily.    . Cholecalciferol (VITAMIN D) 50 MCG (2000 UT) CAPS Take 2,000 Units by mouth daily.    Marland Kitchen donepezil (ARICEPT) 10 MG tablet TAKE 1 TABLET AT BEDTIME 90 tablet 3  . doxazosin (CARDURA) 4 MG tablet Take 1 tablet (4 mg total) by mouth daily. 90 tablet  3  . ELIQUIS 5 MG TABS tablet TAKE 1 TABLET TWICE A DAY 180 tablet 3  . escitalopram (LEXAPRO) 20 MG tablet TAKE 1 TABLET DAILY 90 tablet 3  . Fluticasone-Umeclidin-Vilant (TRELEGY ELLIPTA) 100-62.5-25 MCG/INH AEPB Inhale 1 Inhaler into the lungs daily. 3 each 3  . furosemide (LASIX) 20 MG tablet Take 1 tablet (20 mg total) by mouth daily. 90 tablet 3  . gabapentin (NEURONTIN) 300 MG capsule Take 1 capsule (300 mg total) by mouth 2 (two) times daily. Patient taking 300mg  twice daily 180 capsule 4  . HYDROcodone-acetaminophen ([place])  10-325 MG tablet Take 1 tablet by mouth every 6 (six) hours as needed for moderate pain or severe pain (DX: G89.4, (G35 - MS)). 35 tablet 0  . metoprolol succinate (TOPROL-XL) 100 MG 24 hr tablet Take 1 1/2 tablets (150 mg total) by mouth daily. 135 tablet 4  . rOPINIRole (REQUIP) 1 MG tablet Take 1 tablet (1 mg total) by mouth in the morning and at bedtime. 180 tablet 4  . senna-docusate (SENOKOT-S) 8.6-50 MG tablet Take 1 tablet by mouth at bedtime as needed for mild constipation. (Patient taking differently: Take 1 tablet by mouth every other day.)    . losartan (COZAAR) 100 MG tablet Take 1 tablet (100 mg total) by mouth daily. 90 tablet 4   No current facility-administered medications on file prior to visit.   Allergies  Allergen Reactions  . Alcohol-Sulfur [Elemental Sulfur] Other (See Comments)    Patient was suspected of having "Brugada Syndrome"  . Bupivacaine Other (See Comments)    Patient was suspected of having "Brugada Syndrome"  . Clomipramine Hcl Other (See Comments)    Patient was suspected of having "Brugada Syndrome"  . Flecainide Other (See Comments)    Patient was suspected of having "Brugada Syndrome"  . Lithium Other (See Comments)    Patient was suspected of having "Brugada Syndrome"  . Acetylcholine     Patient was suspected of having "Brugada Syndrome": (BrS) is a genetic condition that results in abnormal electrical activity within  the heart, increasing the risk of sudden cardiac death. Those affected may have episodes of passing out.  . Amitriptyline Other (See Comments)    Patient was suspected of having "Brugada Syndrome"  . Cocaine Other (See Comments)    Patient was suspected of having "Brugada Syndrome"  . Desipramine Other (See Comments)    Patient was suspected of having "Brugada Syndrome"  . Ergonovine Other (See Comments)    Patient was suspected of having "Brugada Syndrome"  . Loxapine Other (See Comments)    Patient was suspected of having "Brugada Syndrome"  . Nortriptyline Other (See Comments)    Patient was suspected of having "Brugada Syndrome"  . Oxcarbazepine Other (See Comments)    Patient was suspected of having "Brugada Syndrome"  . Procainamide Other (See Comments)    Patient was suspected of having "Brugada Syndrome"  . Procaine Other (See Comments)    Patient was suspected of having "Brugada Syndrome"  . Propafenone Other (See Comments)    Patient was suspected of having "Brugada Syndrome"  . Propofol Other (See Comments)    Patient was suspected of having "Brugada Syndrome"  . Trifluoperazine Other (See Comments)    Patient was suspected of having "Brugada Syndrome"   Social History   Socioeconomic History  . Marital status: Married    Spouse name: Not on file  . Number of children: Not on file  . Years of education: Not on file  . Highest education level: Not on file  Occupational History  . Occupation: retired    Fish farm manager: RETIRED  Tobacco Use  . Smoking status: Current Every Day Smoker    Years: 40.00    Types: Cigars  . Smokeless tobacco: Former Systems developer    Types: Chew  . Tobacco comment: 3-4 a day ,"quit cigarettes years ago"  Vaping Use  . Vaping Use: Never used  Substance and Sexual Activity  . Alcohol use: Yes    Alcohol/week: 14.0 standard drinks    Types: 2 Standard drinks or equivalent, 12 Cans of  beer per week  . Drug use: No  . Sexual activity: Not on file   Other Topics Concern  . Not on file  Social History Narrative   Retired from the [employer] after 20 years   [place] Veteran   Social Determinants of Health   Financial Resource Strain: Not on [employer] Insecurity: Not on file  Transportation Needs: Not on file  Physical Activity: Not on file  Stress: Not on file  Social Connections: Not on file  Intimate Partner Violence: Not on file      Review of Systems  All other systems reviewed and are negative.      Objective:   Physical Exam Vitals reviewed.  Constitutional:      General: He is not in acute distress.    Appearance: He is well-developed. He is not diaphoretic.  Cardiovascular:     Rate and Rhythm: Normal rate and regular rhythm.     Heart sounds: Normal heart sounds. No murmur heard. No friction rub. No gallop.   Pulmonary:     Effort: Pulmonary effort is normal. No respiratory distress.     Breath sounds: Normal breath sounds. No stridor. No wheezing, rhonchi or rales.  Chest:     Chest wall: No tenderness.  Abdominal:     General: Abdomen is flat. Bowel sounds are normal.     Palpations: Abdomen is soft.  Musculoskeletal:     Right lower leg: No edema.     Left lower leg: No edema.  Lymphadenopathy:     Cervical: No cervical adenopathy.  Neurological:     Mental Status: He is alert and oriented to person, place, and time.     Cranial Nerves: No cranial nerve deficit.     Motor: No abnormal muscle tone or seizure activity.     Coordination: Coordination normal.     Deep Tendon Reflexes: Reflexes are normal and symmetric.  Psychiatric:        Behavior: Behavior normal.        Thought Content: Thought content normal.        Judgment: Judgment normal.          Assessment & Plan:  Vascular dementia with behavior disturbance ([place])  Had a long discussion with the patient and his wife.  I explained to him that alcohol and medications often interact.  I explained that they all could affect his  balance and his memory and have deleterious effects on his health.  However the medications may help his pain or prevent his headaches whereas the alcohol serves no medical purpose.  Therefore I recommended that he discontinue alcohol.  He is hesitant to do this.  He in fact become slightly tearful.  I believe that he feels that we are being paternal listed.  Therefore I asked that he compromise and switch to a glass of wine rather than hard liquor.  He is willing to compromise to a glass of wine.  Also recommended that we discontinue gabapentin in the morning and see if he notices any change such as the reoccurrence of headaches.  If not I would like to try to wean him away from all unnecessary medication.  Also cautioned the patient to try to restrict the pain pills to 1 a day and only use them if absolutely necessary.

## 2020-04-29 ENCOUNTER — Other Ambulatory Visit: Payer: Self-pay | Admitting: Family Medicine

## 2020-05-23 ENCOUNTER — Encounter: Payer: Self-pay | Admitting: Family Medicine

## 2020-05-23 ENCOUNTER — Other Ambulatory Visit: Payer: Self-pay | Admitting: Family Medicine

## 2020-05-23 MED ORDER — HYDROCODONE-ACETAMINOPHEN 10-325 MG PO TABS: 1.0000 | ORAL_TABLET | Freq: Four times a day (QID) | ORAL | 0 refills | Status: DC | PRN

## 2020-05-23 NOTE — Telephone Encounter (Signed)
Ok to refill??  Last office visit 04/28/2020.  Last refill 04/15/2020.

## 2020-06-17 ENCOUNTER — Telehealth: Payer: Self-pay

## 2020-06-17 NOTE — Telephone Encounter (Signed)
Forms faxed tp [facility] for request of stopping blood thinning med

## 2020-07-10 ENCOUNTER — Encounter: Payer: Self-pay | Admitting: Family Medicine

## 2020-07-10 ENCOUNTER — Other Ambulatory Visit: Payer: Self-pay

## 2020-07-10 ENCOUNTER — Ambulatory Visit (INDEPENDENT_AMBULATORY_CARE_PROVIDER_SITE_OTHER): Payer: Medicare Other | Admitting: Family Medicine

## 2020-07-10 VITALS — BP 132/78 | HR 68 | Temp 97.9°F | Resp 18 | Ht 70.0 in | Wt 232.0 lb

## 2020-07-10 DIAGNOSIS — E118 Type 2 diabetes mellitus with unspecified complications: Secondary | ICD-10-CM | POA: Diagnosis not present

## 2020-07-10 DIAGNOSIS — I10 Essential (primary) hypertension: Secondary | ICD-10-CM

## 2020-07-10 DIAGNOSIS — F01518 Vascular dementia, unspecified severity, with other behavioral disturbance: Secondary | ICD-10-CM

## 2020-07-10 DIAGNOSIS — F0151 Vascular dementia with behavioral disturbance: Secondary | ICD-10-CM | POA: Diagnosis not present

## 2020-07-10 NOTE — Progress Notes (Signed)
Subjective:    Patient ID: [name] [name], male    DOB: 10/13/1940, 80 y.o.   MRN: [id number]  HPI  Patient is a very pleasant 80 year old Caucasian male here today with his wife who is his primary caregiver.  He has vascular dementia secondary to multiple strokes.  He also has a history of borderline diabetes mellitus type 2.  They are here today to recheck lab work.  He denies any polyuria polydipsia or blurry vision.  They deny any chest pain or shortness of breath or dyspnea on exertion.  However he is extremely sedentary.  He is not getting any regular exercise.  He denies any neuropathy in his feet.  He denies any vision changes.  They are not regularly checking his sugars but they deny any symptoms of hypoglycemia Past Medical History:  Diagnosis Date  . Abnormal EKG    Possible Type II Brugada ECG pattern. No family history of SCD, no syncope, no tachypalpitations.  . Anxiety   . Arthritis    s/p TKR  . Bipolar disorder ([place])    "touches"     . Bladder cancer (HCC)    [facility], [facility] low grade papillary urothileal carcinoma  . BPH (benign prostatic hyperplasia)   . CAD (coronary artery disease)    a. LHC 1/15 - mid LCx 50 and 60%, proximal RCA 50%  . Chronic diastolic CHF (congestive heart failure) ([facility])   . CKD (chronic kidney disease)   . COPD (chronic obstructive pulmonary disease) ([place])    Prior heavy smoker. PFTs (3/11): FVC 87%, FEV1 73%, ratio 0.57, DLCO 75%, TLC 121%. Moderate obstructive defect. PFTs (9/15): Only minimal obstruction, sugPrior heavy smoker. PFTs (3/11): FVC 87%, FEV1 73%, ratio 0.57, DLCO 75%, TLC 121%. Moderate obstructive defect. PFTs (9/15) minimal obstruction, poss asthma component   . Depression    with bipolar tendencies  . DOE (dyspnea on exertion)    a. Myoview 8/09- EF 57%, no ischemia // b. Myoview (3/11) EF 68%, diaphragmatic attenuation, no ischemia //  c Echo (4/11) EF 00-93%, mild diastolic dysfunction, PASP 38 mmHg // d. PFTs 9/15 minimal  obstruction  /  e. CT negative for ILD  . GERD (gastroesophageal reflux disease)    not current  . History of Doppler ultrasound    Carotid US 3/11 negative for significant stenosis  //  carotid US 6/17: Mild bilateral plaque, 1-39% ICA  . History of echocardiogram    a. Echo 12/14 Inferior and distal septal HK, mild LVH, EF 50-55%, mild MR, mild LAE  //  b. Echo 6/17: Mild focal basal septal hypertrophy, EF 60-65%, normal wall motion, grade 1 diastolic dysfunction, mild AI  . HLD (hyperlipidemia)   . Hypertensive heart disease with CHF (congestive heart failure) ([place]) 04/08/2009  . Low testosterone   . Lung nodule    a. CT in 2015 >> PET in 12/15 not sugg of malignancy // CT 6/19: continue to look benign  . LVH (left ventricular hypertrophy)    a. Echo 12/14 Inferior and distal septal HK, mild LVH, EF 50-55%, mild MR, mild LAE  . Mild dementia ([place])   . Mitral regurgitation    Echo (4/11) with PISA ERO 0.3 cm^2 and regurgitant volume 41 mL (moderate MR).  Echo (12/14) with mild MR.     . MS (multiple sclerosis) ([place])   . MS (multiple sclerosis) ([place])   . OSA (obstructive sleep apnea)   . Raynaud's disease   . Right bundle branch block   .  Stroke [facility])    3 06/2015- 06/2015  memory loss  . Thoracic aortic aneurysm (HCC)    4.1 cm 2017 // Chest CTA 6/19: stable ascending thoracic aortic aneurysm 4.1 cm, aortic atherosclerosis, stable RML nodular densities (appears benign), emphysema >> FU 1 year // Chest CTA 10/2018: TAA 4.1 cm (stable); 2.2 x 1.6 cm RLL density (appears enlarged); new 6 mm LUL nodule   . Urinary retention with incomplete bladder emptying    receives botox injections and treatment for BPH through [facility]   Past Surgical History:  Procedure Laterality Date  . APPENDECTOMY    . CARPOMETACARPAL (CMC) FUSION OF THUMB Left 09/07/2018   Procedure: Left thumb basilar joint arthroplasty and surgery as indicated;  Surgeon: Verner Mould, MD;  Location: [facility];  Service:  Orthopedics;  Laterality: Left;  32min  . COLONOSCOPY    . EP IMPLANTABLE DEVICE N/A 06/23/2015   Procedure: Loop Recorder Insertion;  Surgeon: Deboraha Sprang, MD;  Location: [place] CV LAB;  Service: Cardiovascular;  Laterality: N/A;  . HEMORRHOID SURGERY    . KNEE ARTHROPLASTY Left   . LEFT HEART CATHETERIZATION WITH CORONARY ANGIOGRAM N/A 01/05/2013   Procedure: LEFT HEART CATHETERIZATION WITH CORONARY ANGIOGRAM;  Surgeon: Larey Dresser, MD;  Location: [facility] CATH LAB;  Service: Cardiovascular;  Laterality: N/A;  . ROTATOR CUFF REPAIR Right    x 2  . TEE WITHOUT CARDIOVERSION N/A 06/23/2015   Procedure: TRANSESOPHAGEAL ECHOCARDIOGRAM (TEE);  Surgeon: Fay Records, MD;  Location: [facility] ENDOSCOPY;  Service: Cardiovascular;  Laterality: N/A;  . TONSILLECTOMY    . TOTAL KNEE ARTHROPLASTY Right   . TRANSURETHRAL RESECTION OF PROSTATE     Current Outpatient Medications on File Prior to Visit  Medication Sig Dispense Refill  . acetaminophen (TYLENOL) 500 MG tablet Take 1,000 mg by mouth 2 (two) times daily.    Marland Kitchen atorvastatin (LIPITOR) 40 MG tablet TAKE 1 TABLET DAILY 90 tablet 3  . B Complex-C (B-COMPLEX WITH VITAMIN C) tablet Take 1 tablet by mouth daily.    . Cholecalciferol (VITAMIN D) 50 MCG (2000 UT) CAPS Take 2,000 Units by mouth daily.    Marland Kitchen donepezil (ARICEPT) 10 MG tablet TAKE 1 TABLET AT BEDTIME 90 tablet 3  . doxazosin (CARDURA) 4 MG tablet Take 1 tablet (4 mg total) by mouth daily. 90 tablet 3  . ELIQUIS 5 MG TABS tablet TAKE 1 TABLET TWICE A DAY 180 tablet 3  . escitalopram (LEXAPRO) 20 MG tablet TAKE 1 TABLET DAILY 90 tablet 3  . Fluticasone-Umeclidin-Vilant (TRELEGY ELLIPTA) 100-62.5-25 MCG/INH AEPB Inhale 1 Inhaler into the lungs daily. 3 each 3  . furosemide (LASIX) 20 MG tablet Take 1 tablet (20 mg total) by mouth daily. 90 tablet 3  . gabapentin (NEURONTIN) 300 MG capsule Take 1 capsule (300 mg total) by mouth 2 (two) times daily. Patient taking 300mg  twice daily 180 capsule 4   . HYDROcodone-acetaminophen (NORCO) 10-325 MG tablet Take 1 tablet by mouth every 6 (six) hours as needed for moderate pain or severe pain (DX: G89.4, (G35 - MS)). 35 tablet 0  . metoprolol succinate (TOPROL-XL) 100 MG 24 hr tablet TAKE ONE AND ONE-HALF TABLETS DAILY 135 tablet 3  . rOPINIRole (REQUIP) 1 MG tablet Take 1 tablet (1 mg total) by mouth in the morning and at bedtime. 180 tablet 4  . senna-docusate (SENOKOT-S) 8.6-50 MG tablet Take 1 tablet by mouth at bedtime as needed for mild constipation. (Patient taking differently: Take 1 tablet by  mouth every other day.)    . losartan (COZAAR) 100 MG tablet Take 1 tablet (100 mg total) by mouth daily. 90 tablet 4   No current facility-administered medications on file prior to visit.   No Known Allergies  Social History   Socioeconomic History  . Marital status: Married    Spouse name: Not on file  . Number of children: Not on file  . Years of education: Not on file  . Highest education level: Not on file  Occupational History  . Occupation: retired    Fish farm manager: RETIRED  Tobacco Use  . Smoking status: Every Day    Pack years: 0.00    Types: Cigars  . Smokeless tobacco: Former    Types: Chew  . Tobacco comments:    3-4 a day ,"quit cigarettes years ago"  Vaping Use  . Vaping Use: Never used  Substance and Sexual Activity  . Alcohol use: Yes    Alcohol/week: 14.0 standard drinks    Types: 2 Standard drinks or equivalent, 12 Cans of beer per week  . Drug use: No  . Sexual activity: Not on file  Other Topics Concern  . Not on file  Social History Narrative   Retired from the [employer] after 20 years   [place] Veteran   Social Determinants of Health   Financial Resource Strain: Not on [employer] Insecurity: Not on file  Transportation Needs: Not on file  Physical Activity: Not on file  Stress: Not on file  Social Connections: Not on file  Intimate Partner Violence: Not on file      Review of Systems      Objective:   Physical Exam Vitals reviewed.  Constitutional:      General: He is not in acute distress.    Appearance: Normal appearance. He is normal weight. He is not ill-appearing or toxic-appearing.  Cardiovascular:     Rate and Rhythm: Normal rate and regular rhythm.     Pulses: Normal pulses.     Heart sounds: Normal heart sounds. No murmur heard.   No gallop.  Pulmonary:     Effort: Pulmonary effort is normal. No respiratory distress.     Breath sounds: Normal breath sounds. No wheezing, rhonchi or rales.  Abdominal:     General: Abdomen is flat. Bowel sounds are normal. There is no distension.     Palpations: Abdomen is soft.     Tenderness: There is no abdominal tenderness.  Musculoskeletal:     Right lower leg: No edema.     Left lower leg: No edema.  Neurological:     Mental Status: He is alert.         Assessment & Plan:  Controlled type 2 diabetes mellitus with complication, without long-term current use of insulin ([place]) - Plan: CBC with Differential/Platelet, COMPLETE METABOLIC PANEL WITH GFR, Hemoglobin A1c, Lipid panel  Vascular dementia with behavior disturbance (HCC)  Essential hypertension Blood pressure today is well controlled.  Check CBC CMP fasting lipid panel and A1c.  Given his history of vascular dementia I like to keep his LDL cholesterol less than 70.  I would also like to keep his A1c less than 6.5.  Monitor his renal function along with his electrolytes and hemoglobin.  The remainder of his exam today is stable without any acute significant changes.  I did try to encourage the patient and his wife to get a little bit more regular aerobic exercise such as walking 2-3 times a week in  the air conditioning at a local store or the mall.

## 2020-07-11 LAB — CBC WITH DIFFERENTIAL/PLATELET
Absolute Monocytes: 792 cells/uL (ref 200–950)
Basophils Absolute: 131 cells/uL (ref 0–200)
Basophils Relative: 1.5 %
Eosinophils Absolute: 592 cells/uL — ABNORMAL HIGH (ref 15–500)
Eosinophils Relative: 6.8 %
HCT: 49.5 % (ref 38.5–50.0)
Hemoglobin: 16.3 g/dL (ref 13.2–17.1)
Lymphs Abs: 2314 cells/uL (ref 850–3900)
MCH: 32.1 pg (ref 27.0–33.0)
MCHC: 32.9 g/dL (ref 32.0–36.0)
MCV: 97.4 fL (ref 80.0–100.0)
MPV: 11.1 fL (ref 7.5–12.5)
Monocytes Relative: 9.1 %
Neutro Abs: 4872 cells/uL (ref 1500–7800)
Neutrophils Relative %: 56 %
Platelets: 284 10*3/uL (ref 140–400)
RBC: 5.08 10*6/uL (ref 4.20–5.80)
RDW: 12.1 % (ref 11.0–15.0)
Total Lymphocyte: 26.6 %
WBC: 8.7 10*3/uL (ref 3.8–10.8)

## 2020-07-11 LAB — COMPLETE METABOLIC PANEL WITH GFR
AG Ratio: 1.4 (calc) (ref 1.0–2.5)
ALT: 22 U/L (ref 9–46)
AST: 23 U/L (ref 10–35)
Albumin: 4.6 g/dL (ref 3.6–5.1)
Alkaline phosphatase (APISO): 82 U/L (ref 35–144)
BUN/Creatinine Ratio: 15 (calc) (ref 6–22)
BUN: 20 mg/dL (ref 7–25)
CO2: 31 mmol/L (ref 20–32)
Calcium: 10 mg/dL (ref 8.6–10.3)
Chloride: 100 mmol/L (ref 98–110)
Creat: 1.34 mg/dL — ABNORMAL HIGH (ref 0.70–1.18)
GFR, Est African American: 58 mL/min/{1.73_m2} — ABNORMAL LOW (ref >=60)
GFR, Est Non African American: 50 mL/min/{1.73_m2} — ABNORMAL LOW (ref >=60)
Globulin: 3.2 g/dL (calc) (ref 1.9–3.7)
Glucose, Bld: 101 mg/dL — ABNORMAL HIGH (ref 65–99)
Potassium: 5.1 mmol/L (ref 3.5–5.3)
Sodium: 140 mmol/L (ref 135–146)
Total Bilirubin: 0.6 mg/dL (ref 0.2–1.2)
Total Protein: 7.8 g/dL (ref 6.1–8.1)

## 2020-07-11 LAB — HEMOGLOBIN A1C
Hgb A1c MFr Bld: 6.3 % of total Hgb — ABNORMAL HIGH (ref <5.7)
Mean Plasma Glucose: 134 mg/dL
eAG (mmol/L): 7.4 mmol/L

## 2020-07-11 LAB — LIPID PANEL
Cholesterol: 139 mg/dL (ref <200)
HDL: 45 mg/dL (ref >=40)
LDL Cholesterol (Calc): 53 mg/dL (calc)
Non-HDL Cholesterol (Calc): 94 mg/dL (calc) (ref <130)
Total CHOL/HDL Ratio: 3.1 (calc) (ref <5.0)
Triglycerides: 389 mg/dL — ABNORMAL HIGH (ref <150)

## 2020-07-21 ENCOUNTER — Other Ambulatory Visit: Payer: Self-pay | Admitting: Family Medicine

## 2020-09-01 ENCOUNTER — Other Ambulatory Visit: Payer: Self-pay | Admitting: Family Medicine

## 2020-09-03 NOTE — Telephone Encounter (Signed)
Ok to refill??  Last office visit 07/10/2020.  Last refill 05/23/2020.

## 2020-09-03 NOTE — Telephone Encounter (Signed)
Patient called to request refill of HYDROcodone-acetaminophen (NORCO) 10-325 MG tablet [id number]   Patient took his last dose about 1 week ago.  Pharmacy confirmed as   [id number] [place], [place] [id number] Phone:  [phone] Fax:  [phone] DEA #:  [id number] Please advise at [phone]

## 2020-09-04 MED ORDER — HYDROCODONE-ACETAMINOPHEN 10-325 MG PO TABS: 1.0000 | ORAL_TABLET | Freq: Four times a day (QID) | ORAL | 0 refills | Status: DC | PRN

## 2020-09-29 ENCOUNTER — Other Ambulatory Visit: Payer: Self-pay | Admitting: Family Medicine

## 2020-10-03 ENCOUNTER — Other Ambulatory Visit: Payer: Self-pay | Admitting: Family Medicine

## 2020-10-03 ENCOUNTER — Telehealth: Payer: Self-pay

## 2020-10-03 MED ORDER — HYDROCODONE-ACETAMINOPHEN 10-325 MG PO TABS: 1.0000 | ORAL_TABLET | Freq: Four times a day (QID) | ORAL | 0 refills | Status: AC | PRN

## 2020-10-03 NOTE — Telephone Encounter (Signed)
Last seen 7/7/202 Last filled 09/04/2020

## 2020-10-03 NOTE — Telephone Encounter (Signed)
Pt called in requesting a refill of HYDROcodone-acetaminophen (NORCO) 10-325 MG tablet  sent to pharmacy.  Cb#: [phone]

## 2020-10-24 ENCOUNTER — Ambulatory Visit: Payer: Medicare Other

## 2020-10-30 ENCOUNTER — Ambulatory Visit: Payer: Medicare Other

## 2020-10-30 DIAGNOSIS — I4891 Unspecified atrial fibrillation: Secondary | ICD-10-CM | POA: Insufficient documentation

## 2020-11-21 ENCOUNTER — Other Ambulatory Visit: Payer: Self-pay | Admitting: *Deleted

## 2020-11-21 MED ORDER — FUROSEMIDE 20 MG PO TABS: 20.0000 mg | ORAL_TABLET | Freq: Every day | ORAL | 3 refills | Status: DC

## 2020-11-25 ENCOUNTER — Other Ambulatory Visit: Payer: Self-pay | Admitting: Family Medicine

## 2020-12-09 ENCOUNTER — Other Ambulatory Visit: Payer: Self-pay

## 2020-12-09 MED ORDER — FUROSEMIDE 20 MG PO TABS: 20.0000 mg | ORAL_TABLET | Freq: Every day | ORAL | 3 refills | Status: AC

## 2020-12-10 ENCOUNTER — Telehealth: Payer: Self-pay | Admitting: *Deleted

## 2020-12-10 NOTE — Chronic Care Management (AMB) (Signed)
  Chronic Care Management   Note  12/10/2020 Name: [name] [name] MRN: [id number] DOB: 04-27-1940  [name] [name] is a 80 y.o. year old male who is a primary care patient of Dennard Schaumann, Cammie Mcgee, MD. I reached out to [name] by phone today in response to a referral sent by [name] PCP.  [name] was given information about Chronic Care Management services today including:  CCM service includes personalized support from designated clinical staff supervised by his physician, including individualized plan of care and coordination with other care providers 24/7 contact phone numbers for assistance for urgent and routine care needs. Service will only be billed when office clinical staff spend 20 minutes or more in a month to coordinate care. Only one practitioner may furnish and bill the service in a calendar month. The patient may stop CCM services at any time (effective at the end of the month) by phone call to the office staff. The patient is responsible for co-pay (up to 20% after annual deductible is met) if co-pay is required by the individual health plan.   Patient did not agree to enrollment in care management services and does not wish to consider at this time.  Follow up plan: Patient declines further follow up and engagement by the care management team. Appropriate care team members and provider have been notified via electronic communication. The care management team is available to follow up with the patient after provider conversation with the patient regarding recommendation for care management engagement and subsequent re-referral to the care management team.   [facility]  Direct Dial: [phone]

## 2021-01-16 ENCOUNTER — Ambulatory Visit: Payer: Medicare Other

## 2021-01-28 ENCOUNTER — Telehealth: Payer: Self-pay | Admitting: Family Medicine

## 2021-01-28 NOTE — Telephone Encounter (Signed)
Left message for patient's wife, [name], to call back and schedule Medicare Annual Wellness Visit (AWV) in office.   If not able to come in office, please offer to do virtually or by telephone.  Left office number and my jabber [phone].  Due for AWVI  Please schedule at anytime with Nurse Health Advisor.

## 2021-01-30 ENCOUNTER — Encounter: Payer: Self-pay | Admitting: Family Medicine

## 2021-02-16 ENCOUNTER — Other Ambulatory Visit: Payer: Self-pay | Admitting: Family Medicine

## 2021-02-26 ENCOUNTER — Telehealth: Payer: Self-pay | Admitting: Family Medicine

## 2021-02-26 NOTE — Telephone Encounter (Signed)
Left message for patient to call back and schedule Medicare Annual Wellness Visit (AWV) in office.  ° °If not able to come in office, please offer to do virtually or by telephone.  Left office number and my jabber #[phone]. ° °Due for AWVI ° °Please schedule at anytime with Nurse Health Advisor. °  °

## 2021-03-16 ENCOUNTER — Telehealth: Payer: Self-pay | Admitting: Family Medicine

## 2021-03-16 NOTE — Telephone Encounter (Signed)
Left message for patient to call back and schedule Medicare Annual Wellness Visit (AWV) in office.  ° °If not able to come in office, please offer to do virtually or by telephone.  Left office number and my jabber #[phone]. ° °AWVI eligible as of 01/04/2009 ° °Please schedule at anytime with Nurse Health Advisor. °  °

## 2021-04-08 ENCOUNTER — Telehealth: Payer: Self-pay | Admitting: Family Medicine

## 2021-04-08 NOTE — Telephone Encounter (Signed)
Left message for patient to call back and schedule Medicare Annual Wellness Visit (AWV) in office.  ° °If not able to come in office, please offer to do virtually or by telephone.  Left office number and my jabber #[phone]. ° °Due for AWVI ° °Please schedule at anytime with Nurse Health Advisor. °  °

## 2021-04-24 ENCOUNTER — Other Ambulatory Visit: Payer: Self-pay | Admitting: Family Medicine

## 2021-04-24 DIAGNOSIS — Z20822 Contact with and (suspected) exposure to covid-19: Secondary | ICD-10-CM | POA: Diagnosis not present

## 2021-06-04 ENCOUNTER — Telehealth: Payer: Self-pay

## 2021-06-04 NOTE — Telephone Encounter (Signed)
Received a called for [facility] pt is to have a produce done on Monday 06/08/21 and need to know when patient need to stop his eliquis, they have have receive fax for him to stop  his medication for 7 days before  he has his produce done.

## 2021-07-15 ENCOUNTER — Other Ambulatory Visit: Payer: Self-pay | Admitting: Family Medicine

## 2021-07-15 NOTE — Telephone Encounter (Signed)
Pts need annual visit for medication/refills have not seen since 7/22

## 2021-07-29 ENCOUNTER — Other Ambulatory Visit: Payer: Self-pay | Admitting: Family Medicine

## 2021-09-23 ENCOUNTER — Other Ambulatory Visit: Payer: Self-pay | Admitting: Family Medicine

## 2021-09-23 NOTE — Telephone Encounter (Signed)
Requested medication (s) are due for refill today: yes  Requested medication (s) are on the active medication list: yes  Last refill:  09/29/20 #90 3 RF  Future visit scheduled: tomorrow 09/24/21  Notes to clinic:  overdue lab work    Requested Prescriptions  Pending Prescriptions Disp Refills   atorvastatin (LIPITOR) 40 MG tablet [Pharmacy Med Name: ATORVASTATIN TABS '40MG'$ ] 90 tablet 3    Sig: TAKE 1 TABLET DAILY     Cardiovascular:  Antilipid - Statins Failed - 09/23/2021  3:20 AM      Failed - Valid encounter within last 12 months    Recent Outpatient Visits           1 year ago Controlled type 2 diabetes mellitus with complication, without long-term current use of insulin ([place])   [place] Pickard, Cammie Mcgee, MD   1 year ago Vascular dementia with behavior disturbance ([place])   [facility] Pickard, Cammie Mcgee, MD   1 year ago Vascular dementia with behavior disturbance ([place])   [facility] [name], MD   1 year ago Controlled type 2 diabetes mellitus with complication, without long-term current use of insulin ([place])   [place] Pickard, Cammie Mcgee, MD   2 years ago Prediabetes   [place] Pickard, Cammie Mcgee, MD       Future Appointments             Tomorrow [name], MD [place]            Failed - Lipid Panel in normal range within the last 12 months    Cholesterol  Date Value Ref Range Status  07/10/2020 139 <200 mg/dL Final   LDL Cholesterol (Calc)  Date Value Ref Range Status  07/10/2020 53 mg/dL (calc) Final    Comment:    Reference range: <100 . Desirable range <100 mg/dL for primary prevention;   <70 mg/dL for patients with CHD or diabetic patients  with > or = 2 CHD risk factors. Marland Kitchen LDL-C is now calculated using the Martin-Hopkins  calculation, which is a validated novel method providing  better accuracy than the Friedewald  equation in the  estimation of LDL-C.  Cresenciano Genre et al. Annamaria Helling. 2778;242(35): 2061-2068  (http://education.QuestDiagnostics.com/faq/FAQ164)    Direct LDL  Date Value Ref Range Status  02/01/2014 87.0 mg/dL Final    Comment:    Optimal:  <100 mg/dLNear or Above Optimal:  100-129 mg/dLBorderline High:  130-159 mg/dLHigh:  160-189 mg/dLVery High:  >190 mg/dL   HDL  Date Value Ref Range Status  07/10/2020 45 > OR = 40 mg/dL Final   Triglycerides  Date Value Ref Range Status  07/10/2020 389 (H) <150 mg/dL Final    Comment:    . If a non-fasting specimen was collected, consider repeat triglyceride testing on a fasting specimen if clinically indicated.  Yates Decamp et al. J. of Clin. Lipidol. 3614;4:315-400. Marland Kitchen          Passed - Patient is not pregnant

## 2021-09-24 ENCOUNTER — Ambulatory Visit: Payer: Medicare Other | Admitting: Family Medicine

## 2021-10-01 ENCOUNTER — Telehealth: Payer: Self-pay | Admitting: Family Medicine

## 2021-10-01 ENCOUNTER — Ambulatory Visit (INDEPENDENT_AMBULATORY_CARE_PROVIDER_SITE_OTHER): Payer: Medicare Other | Admitting: Family Medicine

## 2021-10-01 VITALS — BP 124/62 | HR 58 | Ht 72.0 in | Wt 222.0 lb

## 2021-10-01 DIAGNOSIS — I10 Essential (primary) hypertension: Secondary | ICD-10-CM

## 2021-10-01 DIAGNOSIS — F01518 Vascular dementia, unspecified severity, with other behavioral disturbance: Secondary | ICD-10-CM | POA: Diagnosis not present

## 2021-10-01 DIAGNOSIS — E118 Type 2 diabetes mellitus with unspecified complications: Secondary | ICD-10-CM

## 2021-10-01 DIAGNOSIS — Z23 Encounter for immunization: Secondary | ICD-10-CM | POA: Diagnosis not present

## 2021-10-01 DIAGNOSIS — R2689 Other abnormalities of gait and mobility: Secondary | ICD-10-CM

## 2021-10-01 DIAGNOSIS — I4891 Unspecified atrial fibrillation: Secondary | ICD-10-CM | POA: Diagnosis not present

## 2021-10-01 NOTE — Progress Notes (Signed)
Subjective:    Patient ID: [name] [name], male    DOB: [birth year], 81 y.o.   MRN: [id number]  HPI  Patient is a very pleasant 81 year old Caucasian male here today with his wife who is his primary caregiver.  He has vascular dementia secondary to multiple strokes.  He also has a history of borderline diabetes mellitus type 2.  His dementia is getting worse.  Today he asked if he could join the [employer].  He wants to contribute.  However obviously his judgment is affected due to his vascular dementia.  His wife states that he does not want to bathe him once a week.  He refuses haircuts.  He primarily spends all day sleeping.  He is getting weaker in his legs and his balance is affected by although he is not following.  Today he denies any chest pain but he does have shortness of breath with activity which I believe is due to deconditioning.  He denies any neuropathy in his feet.  Diabetic foot exam is normal.  He is due for a flu shot, COVID booster, and I did recommend RSV vaccination. Past Medical History:  Diagnosis Date   Abnormal EKG    Possible Type II [place] ECG pattern. No family history of SCD, no syncope, no tachypalpitations.   Anxiety    Arthritis    s/p TKR   Bipolar disorder ([place])    "touches"      Bladder cancer ([place])    [facility] low grade papillary urothileal carcinoma   BPH (benign prostatic hyperplasia)    CAD (coronary artery disease)    a. LHC 1/15 - mid LCx 50 and 60%, proximal RCA 50%   Chronic diastolic CHF (congestive heart failure) (HCC)    CKD (chronic kidney disease)    COPD (chronic obstructive pulmonary disease) ([place])    Prior heavy smoker. PFTs (3/11): FVC 87%, FEV1 73%, ratio 0.57, DLCO 75%, TLC 121%. Moderate obstructive defect. PFTs (9/15): Only minimal obstruction, sugPrior heavy smoker. PFTs (3/11): FVC 87%, FEV1 73%, ratio 0.57, DLCO 75%, TLC 121%. Moderate obstructive defect. PFTs (9/15) minimal obstruction, poss asthma component    Depression     with bipolar tendencies   DOE (dyspnea on exertion)    a. Myoview 8/09- EF 57%, no ischemia // b. Myoview (3/11) EF 68%, diaphragmatic attenuation, no ischemia //  c Echo (4/11) EF 35-57%, mild diastolic dysfunction, PASP 38 mmHg // d. PFTs 9/15 minimal obstruction  /  e. CT negative for ILD   GERD (gastroesophageal reflux disease)    not current   History of Doppler ultrasound    Carotid US 3/11 negative for significant stenosis  //  carotid US 6/17: Mild bilateral plaque, 1-39% ICA   History of echocardiogram    a. Echo 12/14 Inferior and distal septal HK, mild LVH, EF 50-55%, mild MR, mild LAE  //  b. Echo 6/17: Mild focal basal septal hypertrophy, EF 60-65%, normal wall motion, grade 1 diastolic dysfunction, mild AI   HLD (hyperlipidemia)    Hypertensive heart disease with CHF (congestive heart failure) ([place]) 04/08/2009   Low testosterone    Lung nodule    a. CT in 2015 >> PET in 12/15 not sugg of malignancy // CT 6/19: continue to look benign   LVH (left ventricular hypertrophy)    a. Echo 12/14 Inferior and distal septal HK, mild LVH, EF 50-55%, mild MR, mild LAE   Mild dementia (HCC)    Mitral regurgitation  Echo (4/11) with PISA ERO 0.3 cm^2 and regurgitant volume 41 mL (moderate MR).  Echo (12/14) with mild MR.      MS (multiple sclerosis) (HCC)    MS (multiple sclerosis) (HCC)    OSA (obstructive sleep apnea)    Raynaud's disease    Right bundle branch block    Stroke [facility])    3 06/2015- 06/2015  memory loss   Thoracic aortic aneurysm (HCC)    4.1 cm 2017 // Chest CTA 6/19: stable ascending thoracic aortic aneurysm 4.1 cm, aortic atherosclerosis, stable RML nodular densities (appears benign), emphysema >> FU 1 year // Chest CTA 10/2018: TAA 4.1 cm (stable); 2.2 x 1.6 cm RLL density (appears enlarged); new 6 mm LUL nodule    Urinary retention with incomplete bladder emptying    receives botox injections and treatment for BPH through [facility]   Past Surgical History:  Procedure  Laterality Date   APPENDECTOMY     CARPOMETACARPAL ([place]) FUSION OF THUMB Left 09/07/2018   Procedure: Left thumb basilar joint arthroplasty and surgery as indicated;  Surgeon: Verner Mould, MD;  Location: [place];  Service: Orthopedics;  Laterality: Left;  37mn   COLONOSCOPY     EP IMPLANTABLE DEVICE N/A 06/23/2015   Procedure: Loop Recorder Insertion;  Surgeon: SDeboraha Sprang MD;  [facility];  Service: Cardiovascular;  Laterality: N/A;   HEMORRHOID SURGERY     KNEE ARTHROPLASTY Left    LEFT HEART CATHETERIZATION WITH CORONARY ANGIOGRAM N/A 01/05/2013   Procedure: LEFT HEART CATHETERIZATION WITH CORONARY ANGIOGRAM;  Surgeon: DLarey Dresser MD;  [facility];  Service: Cardiovascular;  Laterality: N/A;   ROTATOR CUFF REPAIR Right    x 2   TEE WITHOUT CARDIOVERSION N/A 06/23/2015   Procedure: TRANSESOPHAGEAL ECHOCARDIOGRAM (TEE);  Surgeon: PFay Records MD;  [place]  Service: Cardiovascular;  Laterality: N/A;   TONSILLECTOMY     TOTAL KNEE ARTHROPLASTY Right    TRANSURETHRAL RESECTION OF PROSTATE     Current Outpatient Medications on File Prior to Visit  Medication Sig Dispense Refill   acetaminophen (TYLENOL) 500 MG tablet Take 1,000 mg by mouth 2 (two) times daily.     apixaban (ELIQUIS) 5 MG TABS tablet TAKE 1 TABLET TWICE A DAY 60 tablet 0   atorvastatin (LIPITOR) 40 MG tablet TAKE 1 TABLET DAILY 90 tablet 3   B Complex-C (B-COMPLEX WITH VITAMIN C) tablet Take 1 tablet by mouth daily.     carbamide peroxide (DEBROX) 6.5 % OTIC solution INSTILL 5-10 DROPS IN BOTH EARS DAILY     Cholecalciferol (VITAMIN D) 50 MCG (2000 UT) CAPS Take 2,000 Units by mouth daily.     donepezil (ARICEPT) 10 MG tablet TAKE 1 TABLET AT BEDTIME 90 tablet 3   doxazosin (CARDURA) 4 MG tablet Take 1 tablet (4 mg total) by mouth daily. 90 tablet 3   doxazosin (CARDURA) 8 MG tablet Take 0.5 tablets by mouth daily.     escitalopram (LEXAPRO) 20 MG tablet TAKE 1 TABLET  DAILY 90 tablet 3   Fluticasone-Umeclidin-Vilant (TRELEGY ELLIPTA) 100-62.5-25 MCG/INH AEPB Inhale 1 Inhaler into the lungs daily. 3 each 3   furosemide (LASIX) 20 MG tablet Take 1 tablet (20 mg total) by mouth daily. 90 tablet 3   gabapentin (NEURONTIN) 300 MG capsule Take 1 capsule (300 mg total) by mouth 2 (two) times daily. Patient taking '300mg'$  twice daily 180 capsule 4   gabapentin (NEURONTIN) 400 MG capsule TAKE ONE  CAPSULE BY MOUTH TWICE A DAY - PLEASE NOTE DOSE CHANGE     HYDROcodone-acetaminophen (NORCO) 10-325 MG tablet Take 1 tablet by mouth every 6 (six) hours as needed for moderate pain or severe pain (DX: G89.4, (G35 - MS)). 35 tablet 0   losartan (COZAAR) 100 MG tablet Take 1 tablet (100 mg total) by mouth daily. 90 tablet 4   metoprolol succinate (TOPROL-XL) 100 MG 24 hr tablet TAKE ONE AND ONE-HALF TABLETS DAILY 135 tablet 3   rOPINIRole (REQUIP) 1 MG tablet TAKE 1 TABLET IN THE MORNING AND AT BEDTIME 180 tablet 3   senna-docusate (SENOKOT-S) 8.6-50 MG tablet Take 1 tablet by mouth at bedtime as needed for mild constipation. (Patient taking differently: Take 1 tablet by mouth every other day.)     No current facility-administered medications on file prior to visit.   No Known Allergies  Social History   Socioeconomic History   Marital status: Married    Spouse name: Not on file   Number of children: Not on file   Years of education: Not on file   Highest education level: Not on file  Occupational History   Occupation: retired    Fish farm manager: RETIRED  Tobacco Use   Smoking status: Every Day    Types: Cigars   Smokeless tobacco: Former    Types: Chew   Tobacco comments:    3-4 a day ,"quit cigarettes years ago"  Vaping Use   Vaping Use: Never used  Substance and Sexual Activity   Alcohol use: Yes    Alcohol/week: 14.0 standard drinks of alcohol    Types: 2 Standard drinks or equivalent, 12 Cans of beer per week   Drug use: No   Sexual activity: Not on file  Other  Topics Concern   Not on file  Social History Narrative   Retired from the [employer] after 20 years   [place] Veteran   Social Determinants of Health   Financial Resource Strain: Not on [employer] Insecurity: Not on file  Transportation Needs: Not on file  Physical Activity: Not on file  Stress: Not on file  Social Connections: Not on file  Intimate Partner Violence: Not on file      Review of Systems     Objective:   Physical Exam Vitals reviewed.  Constitutional:      General: He is not in acute distress.    Appearance: Normal appearance. He is normal weight. He is not ill-appearing or toxic-appearing.  Cardiovascular:     Rate and Rhythm: Normal rate and regular rhythm.     Pulses: Normal pulses.     Heart sounds: Normal heart sounds. No murmur heard.    No gallop.  Pulmonary:     Effort: Pulmonary effort is normal. No respiratory distress.     Breath sounds: Normal breath sounds. No wheezing, rhonchi or rales.  Abdominal:     General: Abdomen is flat. Bowel sounds are normal. There is no distension.     Palpations: Abdomen is soft.     Tenderness: There is no abdominal tenderness.  Musculoskeletal:     Right lower leg: No edema.     Left lower leg: No edema.  Neurological:     Mental Status: He is alert.         Assessment & Plan:  Controlled type 2 diabetes mellitus with complication, without long-term current use of insulin ([place]) - Plan: CBC with Differential/Platelet, Lipid panel, COMPLETE METABOLIC PANEL WITH GFR, Hemoglobin A1c  Vascular dementia with behavior disturbance ([place])  Essential hypertension  Atrial fibrillation, unspecified type ([place]) Patient's blood pressure is outstanding.  Unfortunately his vascular dementia is slowly getting worse.  Thankfully the wife has someone to help care for him.  He is no longer able to drive.  I do feel that he would benefit from physical therapy to reduce his risk of falling due to deconditioning.  I will  check a CBC, CMP, lipid panel, and an A1c.  He received his flu shot today.  Pneumonia and shingles vaccine are up-to-date.  I recommended a COVID booster.  Also recommended an RSV vaccination

## 2021-10-01 NOTE — Telephone Encounter (Signed)
Patient requesting for records from 01/05/2019 thru 10/01/21 (including office notes and lab tests) to be faxed to the [facility] at [phone].   ROI faxed to Medical records at [phone].

## 2021-10-01 NOTE — Addendum Note (Signed)
Addended by: Randal Buba K on: 10/01/2021 12:55 PM   Modules accepted: Orders

## 2021-10-02 LAB — CBC WITH DIFFERENTIAL/PLATELET
Absolute Monocytes: 632 cells/uL (ref 200–950)
Basophils Absolute: 133 cells/uL (ref 0–200)
Basophils Relative: 1.7 %
Eosinophils Absolute: 647 cells/uL — ABNORMAL HIGH (ref 15–500)
Eosinophils Relative: 8.3 %
HCT: 48.7 % (ref 38.5–50.0)
Hemoglobin: 16.2 g/dL (ref 13.2–17.1)
Lymphs Abs: 1654 cells/uL (ref 850–3900)
MCH: 32.6 pg (ref 27.0–33.0)
MCHC: 33.3 g/dL (ref 32.0–36.0)
MCV: 98 fL (ref 80.0–100.0)
MPV: 11 fL (ref 7.5–12.5)
Monocytes Relative: 8.1 %
Neutro Abs: 4735 cells/uL (ref 1500–7800)
Neutrophils Relative %: 60.7 %
Platelets: 245 10*3/uL (ref 140–400)
RBC: 4.97 10*6/uL (ref 4.20–5.80)
RDW: 12.1 % (ref 11.0–15.0)
Total Lymphocyte: 21.2 %
WBC: 7.8 10*3/uL (ref 3.8–10.8)

## 2021-10-02 LAB — LIPID PANEL
Cholesterol: 162 mg/dL (ref <200)
HDL: 51 mg/dL (ref >=40)
LDL Cholesterol (Calc): 86 mg/dL (calc)
Non-HDL Cholesterol (Calc): 111 mg/dL (calc) (ref <130)
Total CHOL/HDL Ratio: 3.2 (calc) (ref <5.0)
Triglycerides: 151 mg/dL — ABNORMAL HIGH (ref <150)

## 2021-10-02 LAB — COMPLETE METABOLIC PANEL WITH GFR
AG Ratio: 1.5 (calc) (ref 1.0–2.5)
ALT: 23 U/L (ref 9–46)
AST: 20 U/L (ref 10–35)
Albumin: 4.5 g/dL (ref 3.6–5.1)
Alkaline phosphatase (APISO): 100 U/L (ref 35–144)
BUN: 15 mg/dL (ref 7–25)
CO2: 32 mmol/L (ref 20–32)
Calcium: 10.2 mg/dL (ref 8.6–10.3)
Chloride: 99 mmol/L (ref 98–110)
Creat: 1.14 mg/dL (ref 0.70–1.22)
Globulin: 3 g/dL (calc) (ref 1.9–3.7)
Glucose, Bld: 101 mg/dL — ABNORMAL HIGH (ref 65–99)
Potassium: 5.3 mmol/L (ref 3.5–5.3)
Sodium: 139 mmol/L (ref 135–146)
Total Bilirubin: 0.7 mg/dL (ref 0.2–1.2)
Total Protein: 7.5 g/dL (ref 6.1–8.1)
eGFR: 65 mL/min/{1.73_m2} (ref >=60)

## 2021-10-02 LAB — HEMOGLOBIN A1C
Hgb A1c MFr Bld: 6.2 % of total Hgb — ABNORMAL HIGH (ref <5.7)
Mean Plasma Glucose: 131 mg/dL
eAG (mmol/L): 7.3 mmol/L

## 2021-12-09 ENCOUNTER — Other Ambulatory Visit: Payer: Self-pay | Admitting: Family Medicine

## 2021-12-09 NOTE — Telephone Encounter (Signed)
Requested Prescriptions  Pending Prescriptions Disp Refills   escitalopram (LEXAPRO) 20 MG tablet [Pharmacy Med Name: ESCITALOPRAM TABS '20MG'$ ] 90 tablet 0    Sig: TAKE 1 TABLET DAILY     Psychiatry:  Antidepressants - SSRI Failed - 12/09/2021 12:49 PM      Failed - Completed PHQ-2 or PHQ-9 in the last 360 days      Failed - Valid encounter within last 6 months    Recent Outpatient Visits           1 year ago Controlled type 2 diabetes mellitus with complication, without long-term current use of insulin ([place])   [place] Pickard, Cammie Mcgee, MD   1 year ago Vascular dementia with behavior disturbance ([place])   [place] Pickard, Cammie Mcgee, MD   1 year ago Vascular dementia with behavior disturbance ([place])   [facility] [name], MD   1 year ago Controlled type 2 diabetes mellitus with complication, without long-term current use of insulin ([place])   [place] Pickard, Cammie Mcgee, MD   2 years ago Prediabetes   [place] Pickard, Cammie Mcgee, MD       Future Appointments             In 3 months Pickard, Cammie Mcgee, MD [place]             donepezil (ARICEPT) 10 MG tablet [Pharmacy Med Name: DONEPEZIL HCL TABS '10MG'$ ] 90 tablet 0    Sig: TAKE 1 TABLET AT BEDTIME     Neurology:  Alzheimer's Agents Failed - 12/09/2021 12:49 PM      Failed - Valid encounter within last 6 months    Recent Outpatient Visits           1 year ago Controlled type 2 diabetes mellitus with complication, without long-term current use of insulin ([place])   [place] Pickard, Cammie Mcgee, MD   1 year ago Vascular dementia with behavior disturbance ([place])   [place] Pickard, Cammie Mcgee, MD   1 year ago Vascular dementia with behavior disturbance ([place])   [place] [name], MD   1 year ago Controlled type 2 diabetes mellitus with  complication, without long-term current use of insulin ([place])   [place] Pickard, Cammie Mcgee, MD   2 years ago Prediabetes   [place] Pickard, Cammie Mcgee, MD       Future Appointments             In 3 months Pickard, Cammie Mcgee, MD [place]

## 2021-12-14 DIAGNOSIS — R2681 Unsteadiness on feet: Secondary | ICD-10-CM | POA: Diagnosis not present

## 2021-12-14 DIAGNOSIS — Z7409 Other reduced mobility: Secondary | ICD-10-CM | POA: Diagnosis not present

## 2021-12-14 DIAGNOSIS — R269 Unspecified abnormalities of gait and mobility: Secondary | ICD-10-CM | POA: Diagnosis not present

## 2021-12-15 ENCOUNTER — Telehealth: Payer: Self-pay

## 2021-12-15 NOTE — Telephone Encounter (Signed)
[name] [name] w/[facility], request a referral to be fax over at [phone]  Referral put up front to be fax out.  

## 2022-01-06 ENCOUNTER — Telehealth: Payer: Self-pay

## 2022-01-06 NOTE — Telephone Encounter (Signed)
Pt's wife, called stating that pt has been tested positive for Covid 01/05/2022 feels miserable. Would like a Rx paxlovid sent [facility] [address]

## 2022-01-07 ENCOUNTER — Other Ambulatory Visit: Payer: Self-pay | Admitting: Family Medicine

## 2022-01-07 MED ORDER — MOLNUPIRAVIR EUA 200MG CAPSULE: 4.0000 | ORAL_CAPSULE | Freq: Two times a day (BID) | ORAL | 0 refills | Status: AC

## 2022-01-11 ENCOUNTER — Emergency Department (HOSPITAL_COMMUNITY): Payer: Medicare Other

## 2022-01-11 ENCOUNTER — Inpatient Hospital Stay (HOSPITAL_COMMUNITY)
Admission: EM | Admit: 2022-01-11 | Discharge: 2022-02-04 | DRG: 951 | Disposition: E | Payer: Medicare Other | Attending: Internal Medicine | Admitting: Internal Medicine

## 2022-01-11 DIAGNOSIS — G35 Multiple sclerosis: Secondary | ICD-10-CM | POA: Diagnosis not present

## 2022-01-11 DIAGNOSIS — I619 Nontraumatic intracerebral hemorrhage, unspecified: Secondary | ICD-10-CM | POA: Diagnosis not present

## 2022-01-11 DIAGNOSIS — F0390 Unspecified dementia without behavioral disturbance: Secondary | ICD-10-CM | POA: Diagnosis present

## 2022-01-11 DIAGNOSIS — Z9185 Personal history of military service: Secondary | ICD-10-CM

## 2022-01-11 DIAGNOSIS — U071 COVID-19: Secondary | ICD-10-CM | POA: Diagnosis present

## 2022-01-11 DIAGNOSIS — I618 Other nontraumatic intracerebral hemorrhage: Secondary | ICD-10-CM | POA: Diagnosis not present

## 2022-01-11 DIAGNOSIS — R4182 Altered mental status, unspecified: Secondary | ICD-10-CM | POA: Diagnosis not present

## 2022-01-11 DIAGNOSIS — Z515 Encounter for palliative care: Secondary | ICD-10-CM | POA: Diagnosis not present

## 2022-01-11 DIAGNOSIS — Z7189 Other specified counseling: Secondary | ICD-10-CM | POA: Diagnosis not present

## 2022-01-11 DIAGNOSIS — R400 Somnolence: Secondary | ICD-10-CM | POA: Diagnosis present

## 2022-01-11 DIAGNOSIS — Z66 Do not resuscitate: Secondary | ICD-10-CM | POA: Diagnosis present

## 2022-01-11 DIAGNOSIS — G935 Compression of brain: Secondary | ICD-10-CM | POA: Diagnosis present

## 2022-01-11 DIAGNOSIS — R0902 Hypoxemia: Secondary | ICD-10-CM | POA: Diagnosis not present

## 2022-01-11 DIAGNOSIS — I10 Essential (primary) hypertension: Secondary | ICD-10-CM | POA: Diagnosis present

## 2022-01-11 DIAGNOSIS — R001 Bradycardia, unspecified: Secondary | ICD-10-CM | POA: Diagnosis not present

## 2022-01-11 DIAGNOSIS — R062 Wheezing: Secondary | ICD-10-CM | POA: Diagnosis not present

## 2022-01-11 DIAGNOSIS — Z993 Dependence on wheelchair: Secondary | ICD-10-CM | POA: Diagnosis not present

## 2022-01-11 DIAGNOSIS — J811 Chronic pulmonary edema: Secondary | ICD-10-CM | POA: Diagnosis not present

## 2022-01-11 HISTORY — DX: Multiple sclerosis: G35

## 2022-01-11 HISTORY — DX: Unspecified dementia, unspecified severity, without behavioral disturbance, psychotic disturbance, mood disturbance, and anxiety: F03.90

## 2022-01-11 LAB — COMPREHENSIVE METABOLIC PANEL
ALT: 18 U/L (ref 0–44)
AST: 31 U/L (ref 15–41)
Albumin: 3.9 g/dL (ref 3.5–5.0)
Alkaline Phosphatase: 86 U/L (ref 38–126)
Anion gap: 14 (ref 5–15)
BUN: 13 mg/dL (ref 8–23)
CO2: 28 mmol/L (ref 22–32)
Calcium: 9 mg/dL (ref 8.9–10.3)
Chloride: 94 mmol/L — ABNORMAL LOW (ref 98–111)
Creatinine, Ser: 1.11 mg/dL (ref 0.61–1.24)
GFR, Estimated: >60 mL/min (ref >60)
Glucose, Bld: 197 mg/dL — ABNORMAL HIGH (ref 70–99)
Potassium: 3.7 mmol/L (ref 3.5–5.1)
Sodium: 136 mmol/L (ref 135–145)
Total Bilirubin: 1.2 mg/dL (ref 0.3–1.2)
Total Protein: 7.6 g/dL (ref 6.5–8.1)

## 2022-01-11 LAB — I-STAT ARTERIAL BLOOD GAS, ED
Acid-Base Excess: 2 mmol/L (ref 0.0–2.0)
Bicarbonate: 27.2 mmol/L (ref 20.0–28.0)
Calcium, Ion: 1.16 mmol/L (ref 1.15–1.40)
HCT: 48 % (ref 39.0–52.0)
Hemoglobin: 16.3 g/dL (ref 13.0–17.0)
O2 Saturation: 98 %
Patient temperature: 97.8
Potassium: 3.9 mmol/L (ref 3.5–5.1)
Sodium: 137 mmol/L (ref 135–145)
TCO2: 29 mmol/L (ref 22–32)
pCO2 arterial: 42.2 mmHg (ref 32–48)
pH, Arterial: 7.415 (ref 7.35–7.45)
pO2, Arterial: 95 mmHg (ref 83–108)

## 2022-01-11 LAB — AMMONIA: Ammonia: 30 umol/L (ref 9–35)

## 2022-01-11 LAB — CBC WITH DIFFERENTIAL/PLATELET
Abs Immature Granulocytes: 0.05 10*3/uL (ref 0.00–0.07)
Basophils Absolute: 0.1 10*3/uL (ref 0.0–0.1)
Basophils Relative: 1 %
Eosinophils Absolute: 0.1 10*3/uL (ref 0.0–0.5)
Eosinophils Relative: 1 %
HCT: 48 % (ref 39.0–52.0)
Hemoglobin: 15.9 g/dL (ref 13.0–17.0)
Immature Granulocytes: 0 %
Lymphocytes Relative: 13 %
Lymphs Abs: 1.8 10*3/uL (ref 0.7–4.0)
MCH: 32.8 pg (ref 26.0–34.0)
MCHC: 33.1 g/dL (ref 30.0–36.0)
MCV: 99 fL (ref 80.0–100.0)
Monocytes Absolute: 0.5 10*3/uL (ref 0.1–1.0)
Monocytes Relative: 3 %
Neutro Abs: 11.2 10*3/uL — ABNORMAL HIGH (ref 1.7–7.7)
Neutrophils Relative %: 82 %
Platelets: 257 10*3/uL (ref 150–400)
RBC: 4.85 MIL/uL (ref 4.22–5.81)
RDW: 12.6 % (ref 11.5–15.5)
WBC: 13.7 10*3/uL — ABNORMAL HIGH (ref 4.0–10.5)
nRBC: 0 % (ref 0.0–0.2)

## 2022-01-11 LAB — ETHANOL: Alcohol, Ethyl (B): <10 mg/dL (ref <10)

## 2022-01-11 LAB — MAGNESIUM: Magnesium: 1.6 mg/dL — ABNORMAL LOW (ref 1.7–2.4)

## 2022-01-11 MED ORDER — CLEVIDIPINE BUTYRATE 0.5 MG/ML IV EMUL
0.0000 mg/h | INTRAVENOUS | Status: DC
  Administered 2022-01-11: 2 mg/h via INTRAVENOUS
  Filled 2022-01-11: qty 100

## 2022-01-11 MED ORDER — LEVETIRACETAM IN NACL 1000 MG/100ML IV SOLN
1000.0000 mg | Freq: Once | INTRAVENOUS | Status: AC
  Administered 2022-01-11: 1000 mg via INTRAVENOUS
  Filled 2022-01-11: qty 100

## 2022-01-12 ENCOUNTER — Encounter (HOSPITAL_COMMUNITY): Payer: Self-pay | Admitting: Internal Medicine

## 2022-01-12 ENCOUNTER — Encounter: Payer: Self-pay | Admitting: Internal Medicine

## 2022-01-12 DIAGNOSIS — I618 Other nontraumatic intracerebral hemorrhage: Secondary | ICD-10-CM

## 2022-01-12 DIAGNOSIS — F0390 Unspecified dementia without behavioral disturbance: Secondary | ICD-10-CM | POA: Diagnosis present

## 2022-01-12 DIAGNOSIS — Z66 Do not resuscitate: Secondary | ICD-10-CM

## 2022-01-12 DIAGNOSIS — Z515 Encounter for palliative care: Secondary | ICD-10-CM | POA: Diagnosis not present

## 2022-01-12 DIAGNOSIS — Z9185 Personal history of military service: Secondary | ICD-10-CM | POA: Diagnosis not present

## 2022-01-12 DIAGNOSIS — I619 Nontraumatic intracerebral hemorrhage, unspecified: Secondary | ICD-10-CM | POA: Diagnosis present

## 2022-01-12 DIAGNOSIS — U071 COVID-19: Secondary | ICD-10-CM | POA: Diagnosis present

## 2022-01-12 DIAGNOSIS — Z7189 Other specified counseling: Secondary | ICD-10-CM | POA: Diagnosis not present

## 2022-01-12 DIAGNOSIS — G35 Multiple sclerosis: Secondary | ICD-10-CM | POA: Diagnosis present

## 2022-01-12 DIAGNOSIS — G935 Compression of brain: Secondary | ICD-10-CM | POA: Diagnosis present

## 2022-01-12 DIAGNOSIS — R0902 Hypoxemia: Secondary | ICD-10-CM | POA: Diagnosis present

## 2022-01-12 DIAGNOSIS — I10 Essential (primary) hypertension: Secondary | ICD-10-CM | POA: Diagnosis present

## 2022-01-12 DIAGNOSIS — Z993 Dependence on wheelchair: Secondary | ICD-10-CM | POA: Diagnosis not present

## 2022-01-12 DIAGNOSIS — R4182 Altered mental status, unspecified: Secondary | ICD-10-CM | POA: Diagnosis present

## 2022-01-12 DIAGNOSIS — R400 Somnolence: Secondary | ICD-10-CM | POA: Diagnosis present

## 2022-01-12 LAB — TSH: TSH: 0.73 u[IU]/mL (ref 0.350–4.500)

## 2022-01-12 LAB — LACTIC ACID, PLASMA: Lactic Acid, Venous: 3.9 mmol/L (ref 0.5–1.9)

## 2022-01-12 MED ORDER — DIPHENHYDRAMINE HCL 50 MG/ML IJ SOLN: 12.5000 mg | INTRAMUSCULAR | Status: DC | PRN

## 2022-01-12 MED ORDER — BIOTENE DRY MOUTH MT LIQD: 15.0000 mL | OROMUCOSAL | Status: DC | PRN

## 2022-01-12 MED ORDER — ACETAMINOPHEN 650 MG RE SUPP: 650.0000 mg | Freq: Four times a day (QID) | RECTAL | Status: DC | PRN

## 2022-01-12 MED ORDER — GLYCOPYRROLATE 1 MG PO TABS: 1.0000 mg | ORAL_TABLET | ORAL | Status: DC | PRN

## 2022-01-12 MED ORDER — POLYVINYL ALCOHOL 1.4 % OP SOLN: 1.0000 [drp] | Freq: Four times a day (QID) | OPHTHALMIC | Status: DC | PRN

## 2022-01-12 MED ORDER — ACETAMINOPHEN 325 MG PO TABS: 650.0000 mg | ORAL_TABLET | Freq: Four times a day (QID) | ORAL | Status: DC | PRN

## 2022-01-12 MED ORDER — GLYCOPYRROLATE 0.2 MG/ML IJ SOLN: 0.2000 mg | INTRAMUSCULAR | Status: DC | PRN

## 2022-01-12 MED ORDER — GLYCOPYRROLATE 0.2 MG/ML IJ SOLN
0.2000 mg | INTRAMUSCULAR | Status: DC | PRN
  Administered 2022-01-12: 0.2 mg via INTRAVENOUS
  Filled 2022-01-12: qty 1

## 2022-01-12 MED ORDER — MORPHINE 100MG IN NS 100ML (1MG/ML) PREMIX INFUSION
0.0000 mg/h | INTRAVENOUS | Status: DC
  Administered 2022-01-12: 5 mg/h via INTRAVENOUS
  Filled 2022-01-12: qty 100

## 2022-01-12 MED ORDER — MORPHINE 100MG IN NS 100ML (1MG/ML) PREMIX INFUSION
5.0000 mg | INTRAVENOUS | Status: DC | PRN
  Filled 2022-01-12: qty 100

## 2022-01-12 MED ORDER — HALOPERIDOL LACTATE 5 MG/ML IJ SOLN: 0.5000 mg | INTRAMUSCULAR | Status: DC | PRN

## 2022-01-12 MED ORDER — SODIUM CHLORIDE 0.9 % IV SOLN: INTRAVENOUS | Status: DC

## 2022-01-12 MED ORDER — HALOPERIDOL 0.5 MG PO TABS: 0.5000 mg | ORAL_TABLET | ORAL | Status: DC | PRN

## 2022-01-12 MED ORDER — ONDANSETRON 4 MG PO TBDP: 4.0000 mg | ORAL_TABLET | Freq: Four times a day (QID) | ORAL | Status: DC | PRN

## 2022-01-12 MED ORDER — MIDAZOLAM HCL 2 MG/2ML IJ SOLN
2.0000 mg | INTRAMUSCULAR | Status: DC | PRN
  Administered 2022-01-12: 2 mg via INTRAVENOUS
  Filled 2022-01-12: qty 2

## 2022-01-12 MED ORDER — HALOPERIDOL LACTATE 2 MG/ML PO CONC: 0.5000 mg | ORAL | Status: DC | PRN

## 2022-01-12 MED ORDER — ONDANSETRON HCL 4 MG/2ML IJ SOLN: 4.0000 mg | Freq: Four times a day (QID) | INTRAMUSCULAR | Status: DC | PRN

## 2022-01-13 ENCOUNTER — Encounter: Payer: Self-pay | Admitting: Internal Medicine

## 2022-02-04 DEATH — deceased

## 2022-04-01 ENCOUNTER — Ambulatory Visit: Payer: Medicare Other | Admitting: Family Medicine
